# Patient Record
Sex: Female | Born: 1935 | Race: White | Hispanic: No | Smoking: Never smoker
Health system: Southern US, Community
[De-identification: ages and names within clinical notes are randomized; demographics above are authoritative.]

## PROBLEM LIST (undated history)

## (undated) DIAGNOSIS — E785 Hyperlipidemia, unspecified: Secondary | ICD-10-CM

## (undated) DIAGNOSIS — C801 Malignant (primary) neoplasm, unspecified: Secondary | ICD-10-CM

## (undated) DIAGNOSIS — H9190 Unspecified hearing loss, unspecified ear: Secondary | ICD-10-CM

## (undated) DIAGNOSIS — Z87442 Personal history of urinary calculi: Secondary | ICD-10-CM

## (undated) DIAGNOSIS — M1990 Unspecified osteoarthritis, unspecified site: Secondary | ICD-10-CM

## (undated) DIAGNOSIS — IMO0001 Reserved for inherently not codable concepts without codable children: Secondary | ICD-10-CM

## (undated) DIAGNOSIS — E039 Hypothyroidism, unspecified: Secondary | ICD-10-CM

## (undated) DIAGNOSIS — I2510 Atherosclerotic heart disease of native coronary artery without angina pectoris: Secondary | ICD-10-CM

## (undated) DIAGNOSIS — K219 Gastro-esophageal reflux disease without esophagitis: Secondary | ICD-10-CM

## (undated) DIAGNOSIS — I10 Essential (primary) hypertension: Secondary | ICD-10-CM

## (undated) HISTORY — PX: OTHER SURGICAL HISTORY: SHX169

## (undated) HISTORY — DX: Hyperlipidemia, unspecified: E78.5

## (undated) HISTORY — DX: Hypothyroidism, unspecified: E03.9

## (undated) HISTORY — PX: EYE SURGERY: SHX253

## (undated) HISTORY — PX: TUBAL LIGATION: SHX77

## (undated) HISTORY — DX: Unspecified osteoarthritis, unspecified site: M19.90

## (undated) HISTORY — DX: Essential (primary) hypertension: I10

## (undated) HISTORY — PX: ABDOMINAL HYSTERECTOMY: SHX81

## (undated) HISTORY — PX: BREAST BIOPSY: SHX20

## (undated) HISTORY — PX: CHOLECYSTECTOMY: SHX55

---

## 1997-04-19 HISTORY — PX: DILATION AND CURETTAGE OF UTERUS: SHX78

## 1997-04-19 HISTORY — PX: OTHER SURGICAL HISTORY: SHX169

## 1998-05-21 ENCOUNTER — Encounter: Payer: Self-pay | Admitting: *Deleted

## 1998-05-23 ENCOUNTER — Encounter: Payer: Self-pay | Admitting: Internal Medicine

## 1998-05-23 ENCOUNTER — Inpatient Hospital Stay (HOSPITAL_COMMUNITY): Admission: RE | Admit: 1998-05-23 | Discharge: 1998-05-30 | Payer: Self-pay | Admitting: *Deleted

## 1998-05-23 HISTORY — PX: KNEE ARTHROPLASTY: SHX992

## 1998-07-07 ENCOUNTER — Other Ambulatory Visit: Admission: RE | Admit: 1998-07-07 | Discharge: 1998-07-07 | Payer: Self-pay | Admitting: Obstetrics and Gynecology

## 1998-07-19 HISTORY — PX: JOINT REPLACEMENT: SHX530

## 1998-08-04 ENCOUNTER — Inpatient Hospital Stay (HOSPITAL_COMMUNITY): Admission: RE | Admit: 1998-08-04 | Discharge: 1998-08-08 | Payer: Self-pay | Admitting: *Deleted

## 1998-09-22 ENCOUNTER — Ambulatory Visit (HOSPITAL_COMMUNITY): Admission: RE | Admit: 1998-09-22 | Discharge: 1998-09-22 | Payer: Self-pay | Admitting: Obstetrics and Gynecology

## 1998-11-25 ENCOUNTER — Other Ambulatory Visit: Admission: RE | Admit: 1998-11-25 | Discharge: 1998-11-25 | Payer: Self-pay | Admitting: Obstetrics and Gynecology

## 1999-09-18 LAB — HM DEXA SCAN: HM Dexa Scan: NORMAL

## 1999-09-22 ENCOUNTER — Other Ambulatory Visit: Admission: RE | Admit: 1999-09-22 | Discharge: 1999-09-22 | Payer: Self-pay | Admitting: Family Medicine

## 2003-05-25 ENCOUNTER — Encounter: Payer: Self-pay | Admitting: Family Medicine

## 2003-05-25 LAB — CONVERTED CEMR LAB: Pap Smear: NORMAL

## 2003-06-14 ENCOUNTER — Other Ambulatory Visit: Admission: RE | Admit: 2003-06-14 | Discharge: 2003-06-14 | Payer: Self-pay | Admitting: Family Medicine

## 2003-07-05 LAB — FECAL OCCULT BLOOD, GUAIAC: Fecal Occult Blood: NEGATIVE

## 2004-04-30 ENCOUNTER — Ambulatory Visit: Payer: Self-pay | Admitting: Family Medicine

## 2004-05-14 ENCOUNTER — Ambulatory Visit: Payer: Self-pay

## 2004-05-28 ENCOUNTER — Ambulatory Visit: Payer: Self-pay | Admitting: Family Medicine

## 2004-07-30 ENCOUNTER — Encounter: Admission: RE | Admit: 2004-07-30 | Discharge: 2004-07-30 | Payer: Self-pay | Admitting: Family Medicine

## 2005-02-23 ENCOUNTER — Emergency Department (HOSPITAL_COMMUNITY): Admission: EM | Admit: 2005-02-23 | Discharge: 2005-02-23 | Payer: Self-pay | Admitting: Emergency Medicine

## 2005-07-12 ENCOUNTER — Ambulatory Visit: Payer: Self-pay | Admitting: Family Medicine

## 2005-07-12 LAB — CONVERTED CEMR LAB: TSH: 2.16 microintl units/mL

## 2005-10-12 ENCOUNTER — Ambulatory Visit: Payer: Self-pay | Admitting: Family Medicine

## 2005-12-15 ENCOUNTER — Ambulatory Visit: Payer: Self-pay | Admitting: Family Medicine

## 2006-07-05 ENCOUNTER — Ambulatory Visit: Payer: Self-pay | Admitting: Family Medicine

## 2006-10-05 ENCOUNTER — Ambulatory Visit: Payer: Self-pay | Admitting: Family Medicine

## 2006-10-05 DIAGNOSIS — E039 Hypothyroidism, unspecified: Secondary | ICD-10-CM | POA: Insufficient documentation

## 2006-10-05 DIAGNOSIS — E7800 Pure hypercholesterolemia, unspecified: Secondary | ICD-10-CM | POA: Insufficient documentation

## 2006-10-10 LAB — CONVERTED CEMR LAB
ALT: 22 units/L (ref 0–40)
AST: 21 units/L (ref 0–37)
Albumin: 3.4 g/dL — ABNORMAL LOW (ref 3.5–5.2)
BUN: 16 mg/dL (ref 6–23)
Basophils Absolute: 0.1 10*3/uL (ref 0.0–0.1)
Basophils Relative: 0.7 % (ref 0.0–1.0)
CO2: 27 meq/L (ref 19–32)
Calcium: 8.8 mg/dL (ref 8.4–10.5)
Chloride: 109 meq/L (ref 96–112)
Cholesterol: 213 mg/dL (ref 0–200)
Creatinine, Ser: 0.9 mg/dL (ref 0.4–1.2)
Direct LDL: 138.7 mg/dL
Eosinophils Absolute: 0.3 10*3/uL (ref 0.0–0.6)
Eosinophils Relative: 2.8 % (ref 0.0–5.0)
GFR calc Af Amer: 79 mL/min
GFR calc non Af Amer: 65 mL/min
Glucose, Bld: 107 mg/dL — ABNORMAL HIGH (ref 70–99)
HCT: 39.4 % (ref 36.0–46.0)
HDL: 41.8 mg/dL (ref >39.0)
Hemoglobin: 13.7 g/dL (ref 12.0–15.0)
Lymphocytes Relative: 32.5 % (ref 12.0–46.0)
MCHC: 34.7 g/dL (ref 30.0–36.0)
MCV: 92.1 fL (ref 78.0–100.0)
Monocytes Absolute: 1 10*3/uL — ABNORMAL HIGH (ref 0.2–0.7)
Monocytes Relative: 9.6 % (ref 3.0–11.0)
Neutro Abs: 5.4 10*3/uL (ref 1.4–7.7)
Neutrophils Relative %: 54.4 % (ref 43.0–77.0)
Phosphorus: 3.5 mg/dL (ref 2.3–4.6)
Platelets: 181 10*3/uL (ref 150–400)
Potassium: 3.8 meq/L (ref 3.5–5.1)
RBC: 4.27 M/uL (ref 3.87–5.11)
RDW: 13.5 % (ref 11.5–14.6)
Sodium: 141 meq/L (ref 135–145)
TSH: 3.38 microintl units/mL (ref 0.35–5.50)
Total CHOL/HDL Ratio: 5.1
Triglycerides: 182 mg/dL — ABNORMAL HIGH (ref 0–149)
VLDL: 36 mg/dL (ref 0–40)
WBC: 10.1 10*3/uL (ref 4.5–10.5)

## 2007-03-10 ENCOUNTER — Encounter: Payer: Self-pay | Admitting: Family Medicine

## 2007-07-11 ENCOUNTER — Encounter: Payer: Self-pay | Admitting: Family Medicine

## 2007-07-11 DIAGNOSIS — E042 Nontoxic multinodular goiter: Secondary | ICD-10-CM | POA: Insufficient documentation

## 2007-07-11 DIAGNOSIS — M1990 Unspecified osteoarthritis, unspecified site: Secondary | ICD-10-CM | POA: Insufficient documentation

## 2007-07-11 DIAGNOSIS — R609 Edema, unspecified: Secondary | ICD-10-CM | POA: Insufficient documentation

## 2007-07-11 DIAGNOSIS — I10 Essential (primary) hypertension: Secondary | ICD-10-CM | POA: Insufficient documentation

## 2007-07-11 DIAGNOSIS — N3946 Mixed incontinence: Secondary | ICD-10-CM | POA: Insufficient documentation

## 2007-07-11 DIAGNOSIS — Z8679 Personal history of other diseases of the circulatory system: Secondary | ICD-10-CM | POA: Insufficient documentation

## 2007-07-11 DIAGNOSIS — R945 Abnormal results of liver function studies: Secondary | ICD-10-CM | POA: Insufficient documentation

## 2007-07-12 ENCOUNTER — Ambulatory Visit: Payer: Self-pay | Admitting: Family Medicine

## 2007-07-12 DIAGNOSIS — E669 Obesity, unspecified: Secondary | ICD-10-CM | POA: Insufficient documentation

## 2007-09-12 ENCOUNTER — Ambulatory Visit: Payer: Self-pay | Admitting: Family Medicine

## 2007-09-17 LAB — CONVERTED CEMR LAB
ALT: 25 units/L (ref 0–35)
AST: 21 units/L (ref 0–37)
Albumin: 3.5 g/dL (ref 3.5–5.2)
BUN: 13 mg/dL (ref 6–23)
Basophils Absolute: 0.1 10*3/uL (ref 0.0–0.1)
Basophils Relative: 1.2 % — ABNORMAL HIGH (ref 0.0–1.0)
CO2: 28 meq/L (ref 19–32)
Calcium: 9.1 mg/dL (ref 8.4–10.5)
Chloride: 108 meq/L (ref 96–112)
Cholesterol: 187 mg/dL (ref 0–200)
Creatinine, Ser: 1.1 mg/dL (ref 0.4–1.2)
Eosinophils Absolute: 0.3 10*3/uL (ref 0.0–0.7)
Eosinophils Relative: 3 % (ref 0.0–5.0)
GFR calc Af Amer: 63 mL/min
GFR calc non Af Amer: 52 mL/min
Glucose, Bld: 103 mg/dL — ABNORMAL HIGH (ref 70–99)
HCT: 38.6 % (ref 36.0–46.0)
HDL: 37.8 mg/dL — ABNORMAL LOW (ref >39.0)
Hemoglobin: 13.4 g/dL (ref 12.0–15.0)
Hgb A1c MFr Bld: 6 % (ref 4.6–6.0)
LDL Cholesterol: 119 mg/dL — ABNORMAL HIGH (ref 0–99)
Lymphocytes Relative: 35.7 % (ref 12.0–46.0)
MCHC: 34.8 g/dL (ref 30.0–36.0)
MCV: 90.2 fL (ref 78.0–100.0)
Monocytes Absolute: 0.9 10*3/uL (ref 0.1–1.0)
Monocytes Relative: 10.5 % (ref 3.0–12.0)
Neutro Abs: 4.5 10*3/uL (ref 1.4–7.7)
Neutrophils Relative %: 49.6 % (ref 43.0–77.0)
Phosphorus: 4 mg/dL (ref 2.3–4.6)
Platelets: 196 10*3/uL (ref 150–400)
Potassium: 4 meq/L (ref 3.5–5.1)
RBC: 4.28 M/uL (ref 3.87–5.11)
RDW: 13.5 % (ref 11.5–14.6)
Sodium: 141 meq/L (ref 135–145)
Total CHOL/HDL Ratio: 4.9
Triglycerides: 153 mg/dL — ABNORMAL HIGH (ref 0–149)
VLDL: 31 mg/dL (ref 0–40)
WBC: 9 10*3/uL (ref 4.5–10.5)

## 2007-09-26 ENCOUNTER — Ambulatory Visit: Payer: Self-pay | Admitting: Family Medicine

## 2007-09-26 DIAGNOSIS — R739 Hyperglycemia, unspecified: Secondary | ICD-10-CM

## 2007-09-26 DIAGNOSIS — R7303 Prediabetes: Secondary | ICD-10-CM | POA: Insufficient documentation

## 2007-12-27 ENCOUNTER — Ambulatory Visit: Payer: Self-pay | Admitting: Family Medicine

## 2007-12-28 LAB — CONVERTED CEMR LAB
ALT: 18 units/L (ref 0–35)
AST: 19 units/L (ref 0–37)
Cholesterol: 196 mg/dL (ref 0–200)
HDL: 36.6 mg/dL — ABNORMAL LOW (ref >39.0)
Hgb A1c MFr Bld: 5.8 % (ref 4.6–6.0)
LDL Cholesterol: 134 mg/dL — ABNORMAL HIGH (ref 0–99)
Total CHOL/HDL Ratio: 5.4
Triglycerides: 127 mg/dL (ref 0–149)
VLDL: 25 mg/dL (ref 0–40)

## 2008-01-01 ENCOUNTER — Ambulatory Visit: Payer: Self-pay | Admitting: Family Medicine

## 2008-06-26 ENCOUNTER — Ambulatory Visit: Payer: Self-pay | Admitting: Family Medicine

## 2008-06-27 LAB — CONVERTED CEMR LAB
ALT: 22 units/L (ref 0–35)
AST: 16 units/L (ref 0–37)
Albumin: 3.4 g/dL — ABNORMAL LOW (ref 3.5–5.2)
BUN: 18 mg/dL (ref 6–23)
CO2: 27 meq/L (ref 19–32)
Calcium: 9 mg/dL (ref 8.4–10.5)
Chloride: 106 meq/L (ref 96–112)
Cholesterol: 188 mg/dL (ref 0–200)
Creatinine, Ser: 1.1 mg/dL (ref 0.4–1.2)
GFR calc Af Amer: 63 mL/min
GFR calc non Af Amer: 52 mL/min
Glucose, Bld: 102 mg/dL — ABNORMAL HIGH (ref 70–99)
HDL: 46.6 mg/dL (ref >39.0)
Hgb A1c MFr Bld: 5.9 % (ref 4.6–6.0)
LDL Cholesterol: 117 mg/dL — ABNORMAL HIGH (ref 0–99)
Phosphorus: 4.1 mg/dL (ref 2.3–4.6)
Potassium: 3.8 meq/L (ref 3.5–5.1)
Sodium: 140 meq/L (ref 135–145)
Total CHOL/HDL Ratio: 4
Triglycerides: 123 mg/dL (ref 0–149)
VLDL: 25 mg/dL (ref 0–40)

## 2008-07-03 ENCOUNTER — Ambulatory Visit: Payer: Self-pay | Admitting: Family Medicine

## 2008-07-29 ENCOUNTER — Telehealth (INDEPENDENT_AMBULATORY_CARE_PROVIDER_SITE_OTHER): Payer: Self-pay | Admitting: *Deleted

## 2008-11-17 LAB — HM DIABETES EYE EXAM

## 2008-12-18 HISTORY — PX: HAND SURGERY: SHX662

## 2008-12-23 ENCOUNTER — Emergency Department (HOSPITAL_COMMUNITY): Admission: EM | Admit: 2008-12-23 | Discharge: 2008-12-23 | Payer: Self-pay | Admitting: Emergency Medicine

## 2009-01-02 ENCOUNTER — Ambulatory Visit: Payer: Self-pay | Admitting: Family Medicine

## 2009-01-03 ENCOUNTER — Encounter: Admission: RE | Admit: 2009-01-03 | Discharge: 2009-01-03 | Payer: Self-pay | Admitting: Family Medicine

## 2009-01-06 ENCOUNTER — Ambulatory Visit: Payer: Self-pay | Admitting: Family Medicine

## 2009-04-23 ENCOUNTER — Ambulatory Visit: Payer: Self-pay | Admitting: Family Medicine

## 2009-04-24 ENCOUNTER — Encounter (INDEPENDENT_AMBULATORY_CARE_PROVIDER_SITE_OTHER): Payer: Self-pay | Admitting: *Deleted

## 2009-04-24 LAB — CONVERTED CEMR LAB
ALT: 16 units/L (ref 0–35)
AST: 19 units/L (ref 0–37)
Albumin: 3.5 g/dL (ref 3.5–5.2)
BUN: 14 mg/dL (ref 6–23)
CO2: 29 meq/L (ref 19–32)
Calcium: 9.7 mg/dL (ref 8.4–10.5)
Chloride: 104 meq/L (ref 96–112)
Cholesterol: 192 mg/dL (ref 0–200)
Creatinine, Ser: 1 mg/dL (ref 0.4–1.2)
Creatinine,U: 32.7 mg/dL
GFR calc non Af Amer: 57.49 mL/min (ref >60)
Glucose, Bld: 108 mg/dL — ABNORMAL HIGH (ref 70–99)
HDL: 46.7 mg/dL (ref >39.00)
Hgb A1c MFr Bld: 5.8 % (ref 4.6–6.5)
LDL Cholesterol: 113 mg/dL — ABNORMAL HIGH (ref 0–99)
Microalb Creat Ratio: 241.6 mg/g — ABNORMAL HIGH (ref 0.0–30.0)
Microalb, Ur: 7.9 mg/dL — ABNORMAL HIGH (ref 0.0–1.9)
Phosphorus: 4.4 mg/dL (ref 2.3–4.6)
Potassium: 4.5 meq/L (ref 3.5–5.1)
Sodium: 140 meq/L (ref 135–145)
TSH: 4.18 microintl units/mL (ref 0.35–5.50)
Total CHOL/HDL Ratio: 4
Triglycerides: 162 mg/dL — ABNORMAL HIGH (ref 0.0–149.0)
VLDL: 32.4 mg/dL (ref 0.0–40.0)

## 2009-05-27 ENCOUNTER — Ambulatory Visit: Payer: Self-pay | Admitting: Family Medicine

## 2009-07-22 ENCOUNTER — Ambulatory Visit: Payer: Self-pay | Admitting: Family Medicine

## 2009-07-23 ENCOUNTER — Encounter: Payer: Self-pay | Admitting: Family Medicine

## 2009-07-25 ENCOUNTER — Telehealth: Payer: Self-pay | Admitting: Family Medicine

## 2009-07-25 ENCOUNTER — Ambulatory Visit: Payer: Self-pay | Admitting: Family Medicine

## 2009-07-25 ENCOUNTER — Encounter: Admission: RE | Admit: 2009-07-25 | Discharge: 2009-07-25 | Payer: Self-pay | Admitting: Family Medicine

## 2009-10-17 ENCOUNTER — Ambulatory Visit: Payer: Self-pay | Admitting: Family Medicine

## 2009-10-18 LAB — CONVERTED CEMR LAB
ALT: 15 units/L (ref 0–35)
AST: 18 units/L (ref 0–37)
Albumin: 3.8 g/dL (ref 3.5–5.2)
BUN: 18 mg/dL (ref 6–23)
Basophils Absolute: 0 10*3/uL (ref 0.0–0.1)
Basophils Relative: 0.3 % (ref 0.0–3.0)
CO2: 28 meq/L (ref 19–32)
Calcium: 9 mg/dL (ref 8.4–10.5)
Chloride: 106 meq/L (ref 96–112)
Cholesterol: 207 mg/dL — ABNORMAL HIGH (ref 0–200)
Creatinine, Ser: 1 mg/dL (ref 0.4–1.2)
Creatinine,U: 105.3 mg/dL
Direct LDL: 141.8 mg/dL
Eosinophils Absolute: 0.3 10*3/uL (ref 0.0–0.7)
Eosinophils Relative: 2.8 % (ref 0.0–5.0)
GFR calc non Af Amer: 56.76 mL/min (ref >60)
Glucose, Bld: 111 mg/dL — ABNORMAL HIGH (ref 70–99)
HCT: 37.1 % (ref 36.0–46.0)
HDL: 50.4 mg/dL (ref >39.00)
Hemoglobin: 12.9 g/dL (ref 12.0–15.0)
Hgb A1c MFr Bld: 6.1 % (ref 4.6–6.5)
Lymphocytes Relative: 29.3 % (ref 12.0–46.0)
Lymphs Abs: 3.1 10*3/uL (ref 0.7–4.0)
MCHC: 34.8 g/dL (ref 30.0–36.0)
MCV: 91.9 fL (ref 78.0–100.0)
Microalb Creat Ratio: 37.7 mg/g — ABNORMAL HIGH (ref 0.0–30.0)
Microalb, Ur: 39.7 mg/dL — ABNORMAL HIGH (ref 0.0–1.9)
Monocytes Absolute: 0.9 10*3/uL (ref 0.1–1.0)
Monocytes Relative: 8.2 % (ref 3.0–12.0)
Neutro Abs: 6.4 10*3/uL (ref 1.4–7.7)
Neutrophils Relative %: 59.4 % (ref 43.0–77.0)
Phosphorus: 4.1 mg/dL (ref 2.3–4.6)
Platelets: 229 10*3/uL (ref 150.0–400.0)
Potassium: 4.2 meq/L (ref 3.5–5.1)
RBC: 4.03 M/uL (ref 3.87–5.11)
RDW: 15.2 % — ABNORMAL HIGH (ref 11.5–14.6)
Sodium: 141 meq/L (ref 135–145)
TSH: 3.78 microintl units/mL (ref 0.35–5.50)
Total CHOL/HDL Ratio: 4
Triglycerides: 134 mg/dL (ref 0.0–149.0)
VLDL: 26.8 mg/dL (ref 0.0–40.0)
WBC: 10.7 10*3/uL — ABNORMAL HIGH (ref 4.5–10.5)

## 2009-10-22 ENCOUNTER — Ambulatory Visit: Payer: Self-pay | Admitting: Family Medicine

## 2010-01-21 ENCOUNTER — Ambulatory Visit: Payer: Self-pay | Admitting: Family Medicine

## 2010-01-21 LAB — CONVERTED CEMR LAB
ALT: 17 units/L (ref 0–35)
AST: 19 units/L (ref 0–37)
Albumin: 3.6 g/dL (ref 3.5–5.2)
BUN: 18 mg/dL (ref 6–23)
CO2: 29 meq/L (ref 19–32)
Calcium: 9.1 mg/dL (ref 8.4–10.5)
Chloride: 104 meq/L (ref 96–112)
Cholesterol: 207 mg/dL — ABNORMAL HIGH (ref 0–200)
Creatinine, Ser: 1 mg/dL (ref 0.4–1.2)
Direct LDL: 143.9 mg/dL
GFR calc non Af Amer: 56.72 mL/min (ref >60)
Glucose, Bld: 106 mg/dL — ABNORMAL HIGH (ref 70–99)
HDL: 51.3 mg/dL (ref >39.00)
Hgb A1c MFr Bld: 6.2 % (ref 4.6–6.5)
Phosphorus: 3.9 mg/dL (ref 2.3–4.6)
Potassium: 4.2 meq/L (ref 3.5–5.1)
Sodium: 140 meq/L (ref 135–145)
Total CHOL/HDL Ratio: 4
Triglycerides: 121 mg/dL (ref 0.0–149.0)
VLDL: 24.2 mg/dL (ref 0.0–40.0)

## 2010-01-26 ENCOUNTER — Ambulatory Visit: Payer: Self-pay | Admitting: Family Medicine

## 2010-01-26 LAB — HM DIABETES FOOT EXAM

## 2010-02-01 ENCOUNTER — Encounter: Payer: Self-pay | Admitting: Family Medicine

## 2010-02-01 ENCOUNTER — Emergency Department (HOSPITAL_COMMUNITY): Admission: EM | Admit: 2010-02-01 | Discharge: 2010-02-01 | Payer: Self-pay | Admitting: Emergency Medicine

## 2010-02-11 ENCOUNTER — Ambulatory Visit: Payer: Self-pay | Admitting: Family Medicine

## 2010-02-11 DIAGNOSIS — R519 Headache, unspecified: Secondary | ICD-10-CM | POA: Insufficient documentation

## 2010-02-11 DIAGNOSIS — M542 Cervicalgia: Secondary | ICD-10-CM | POA: Insufficient documentation

## 2010-02-11 DIAGNOSIS — R51 Headache: Secondary | ICD-10-CM | POA: Insufficient documentation

## 2010-02-18 LAB — CONVERTED CEMR LAB: Sed Rate: 38 mm/hr — ABNORMAL HIGH (ref 0–22)

## 2010-02-25 ENCOUNTER — Ambulatory Visit: Payer: Self-pay | Admitting: Family Medicine

## 2010-02-26 ENCOUNTER — Encounter: Admission: RE | Admit: 2010-02-26 | Discharge: 2010-02-26 | Payer: Self-pay | Admitting: Family Medicine

## 2010-03-05 ENCOUNTER — Ambulatory Visit: Payer: Self-pay | Admitting: Family Medicine

## 2010-03-17 ENCOUNTER — Ambulatory Visit: Payer: Self-pay | Admitting: Family Medicine

## 2010-03-17 LAB — CONVERTED CEMR LAB
ALT: 18 units/L (ref 0–35)
AST: 19 units/L (ref 0–37)
CRP, High Sensitivity: 16.16 — ABNORMAL HIGH (ref 0.00–5.00)
Cholesterol: 162 mg/dL (ref 0–200)
HDL: 50 mg/dL (ref >39.00)
LDL Cholesterol: 87 mg/dL (ref 0–99)
Sed Rate: 36 mm/hr — ABNORMAL HIGH (ref 0–22)
Total CHOL/HDL Ratio: 3
Triglycerides: 127 mg/dL (ref 0.0–149.0)
VLDL: 25.4 mg/dL (ref 0.0–40.0)

## 2010-03-31 ENCOUNTER — Ambulatory Visit: Payer: Self-pay | Admitting: Family Medicine

## 2010-05-21 NOTE — Progress Notes (Signed)
Summary: see x-ray reports  Phone Note From Other Clinic   Caller: [facility] Summary of Call: Called reports on feet and ankle x-rays.  Reports on in pt's chart. Initial call taken by: Lowella Petties CMA,  July 25, 2009 10:50 AM  Follow-up for Phone Call        please let pt know that her x rays are ok  there is some arthritis - in ankle and feet (called degenerative changes) no sign of bone infection- which is re-assuring elevate ankle over weekend - use ice 10 min at a time  if R ankle is not improved monday- let me know  Follow-up by: Judith Part MD,  July 25, 2009 11:11 AM  Additional Follow-up for Phone Call Additional follow up Details #1::        Patient notified as instructed by telephone. Lewanda Rife LPN  July 25, 8293 1:11 PM

## 2010-05-21 NOTE — Assessment & Plan Note (Signed)
Summary: 2 M F/U  DLO   Vital Signs:  Patient Profile:   77 Years Old Female Weight:      253 pounds Temp:     97.9 degrees F oral Pulse rate:   60 / minute Pulse rhythm:   regular BP sitting:   152 / 80  (left arm) Cuff size:   2 monlarge  Vitals Entered By: Lowella Petties (September 26, 2007 9:10 AM)                 Serial Vital Signs/Assessments:  Time      Position  BP       Pulse  Resp  Temp     By                     140/70                         Judith Part MD   Chief Complaint:  2 month follow up.  History of Present Illness: cut out some things in her diet-- fried foods more salads lately cut out a lot of the junk food  does not eat much red meat  LDL 119 (downn from 130s) HDL 37.8  exercises in garden  does walk- but not for exercise -- is very active with house and yard work  can not walk very far without having to sit down -gets tired 5-10 minutes  this may be due to wt or poor conditioning no sob or pain- just tired   AIC 6.0- she knows fair amt about DM  she does have some sweets currently and soda   bp at home mid 130s over 70s  no headaches or leg swelling     Current Allergies: No known allergies   Past Medical History:    Blood transfusion    Hypertension    Hypothyroidism    Osteoarthritis    hyperlipidemia    DM 2 6/09      Past Surgical History:    Reviewed history from 07/11/2007 and no changes required:       Cholecystectomy       Tubal ligation       Total right knee arthroplasty (05/23/1998)       Total knee replacement (07/1998)       Polyp removal       Dexa- normal (09/1999)       Rib fracture       Carotid dopplers- normal (04/2004)       Wrist fracture (2007)       Kidney stones (2007)   Family History:    Reviewed history from 07/11/2007 and no changes required:       Father: died from MI       Mother: died age 54       Siblings: 6 brothers- 1 died from cancer, 1 died from leukemia, 1 died from stomach  cancer, 1 had CABG, 2 ok.  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  Eyes      Denies blurring and eye pain.  CV      Denies chest pain or discomfort, near fainting, palpitations, shortness of breath with exertion, and swelling of feet.  Resp      Denies cough and wheezing.  GI      Denies abdominal pain and change in bowel habits.  GU      Denies urinary frequency.  Derm      Denies changes in color of skin and rash.  Neuro      Complains of tingling.      Denies tremors and visual disturbances.      has baseline tingling in toes no new numbness in fingers or toes   Psych      Denies anxiety and depression.      mood is overall ok   Endo      Denies excessive thirst and excessive urination.   Physical Exam  General:     overweight but generally well appearing  Head:     normocephalic, atraumatic, and no abnormalities observed.   Eyes:     vision grossly intact, pupils equal, pupils round, and pupils reactive to light.   Neck:     supple with full rom and no masses or thyromegally, no JVD or carotid bruit  Lungs:     Normal respiratory effort, chest expands symmetrically. Lungs are clear to auscultation, no crackles or wheezes. Heart:     Normal rate and regular rhythm. S1 and S2 normal without gallop, murmur, click, rub or other extra sounds. Abdomen:     soft and non-tender.  no renal bruits  Msk:     No deformity or scoliosis noted of thoracic or lumbar spine.   Pulses:     R and L carotid,radial,femoral,dorsalis pedis and posterior tibial pulses are full and equal bilaterally Extremities:     No clubbing, cyanosis, edema, or deformity noted with normal full range of motion of all  joints.   Neurologic:     sensation intact to light touch, gait normal, and DTRs symmetrical and normal.  nl monofilament fingers and toes  Skin:     Intact without suspicious lesions or rashes lentigos diffusely  Cervical Nodes:     No lymphadenopathy noted Psych:     cheerful but sometimes gaurded affect overall pleasant     Impression & Recommendations:  Problem # 1:  OBESITY (ICD-278.00) Assessment: Unchanged pt is aware this is directly aff sugar control plan to re start wt watchers- info given goal 5-10 lb loss in 3 mo with rest cal diet and exercise 5-10 min daily ? unsure if pt is motivated at this time  Problem # 2:  HYPERTENSION (ICD-401.9) Assessment: Unchanged bp borderline today if still elevated at next visit after lifestyle change-- will need to add or inc tx Her updated medication list for this problem includes:    Metoprolol Tartrate 100 Mg Tabs (Metoprolol tartrate) .[name]... 1 by mouth two times a day    Norvasc 5 Mg Tabs (Amlodipine besylate) .[name]... Take one by mouth daily    Cardura 8 Mg Tabs (Doxazosin mesylate) .[name]... Take one by mouth daily   Problem # 3:  HYPERCHOLESTEROLEMIA, PURE (ICD-272.0) Assessment: Improved good job with dec LDL from 130s to 119 disc goal of 100 or below (or poss 70 if diabetic) will further cut sat fat in diet labs 3 mo then f/u Labs Reviewed: Chol: 187 (09/12/2007)   HDL: 37.8 (09/12/2007)  LDL: 119 (09/12/2007)   TG: 153 (09/12/2007) SGOT: 21 (09/12/2007)   SGPT: 25 (09/12/2007)   Problem # 4:  DIABETES MELLITUS, TYPE II (ICD-250.00) new dx pt chooses to work on diet and exercise for wt loss and re eval in 3 mo disc diet plan in detail -esp giving up sweets and soda if not imp with next [facility]-- will likely set up with Dm teaching and glucose test equipt pt wishes to stay off all meds unless abs necessary   Problem # 5:  GOITER, MULTINODULAR (ICD-241.1) past hx of goiter and hypothyroid will need new endo ref in  fall will disc further at f/u  Complete Medication List: 1)  Synthroid 75 Mcg Tabs (Levothyroxine sodium) .... Take one by mouth daily 2)  Metoprolol Tartrate 100 Mg Tabs (Metoprolol tartrate) .[name].. 1 by mouth two times a day 3)  Norvasc 5 Mg Tabs (Amlodipine besylate) .... Take one by mouth daily 4)  Cardura 8 Mg Tabs (Doxazosin mesylate) .... Take one by mouth daily   Patient Instructions: 1)  stay away from sweets and sugar drinks 2)  quit soda entirely  3)  diet soda and artificail sweeteners for tea and lemonade are fine 4)  think about weight watchers 5)  add 5-10 minutes of extra exercise daily 6)  goal is 5-10 lb weight loss in 3 months  7)  fasting labs 3 months lipid/ast/alt AIC then f/u 272, 250.0   ] Prior Medications (reviewed today): SYNTHROID 75 MCG  TABS (LEVOTHYROXINE SODIUM) take one by mouth daily METOPROLOL TARTRATE 100 MG  TABS (METOPROLOL TARTRATE) 1 by mouth two times a day NORVASC 5 MG  TABS (AMLODIPINE BESYLATE) take one by mouth daily CARDURA 8 MG  TABS (DOXAZOSIN MESYLATE) take one by mouth daily Current Allergies: No known allergies

## 2010-05-21 NOTE — Assessment & Plan Note (Signed)
Summary: ER F/U [facility] 02/01/10/DLO   Vital Signs:  Patient profile:   77 year old female Height:      63 inches Weight:      255.75 pounds BMI:     45.47 Temp:     97.3 degrees F oral Pulse rate:   64 / minute Pulse rhythm:   regular BP sitting:   118 / 70  (left arm) Cuff size:   large  Vitals Entered By: Lewanda Rife LPN (February 11, 2010 8:07 AM) CC: ER f/u from [facility] 02/01/10   History of Present Illness: here for f/u of ER visit on 10/17 for headache  she had so much pain in her neck and head that she just could not take it  2pm on 12th started with a neck crick - whole neck and really hurt to move it then a headache -- pain went into ears and into top of head  given prednisone and ultram  did Cs films   x rays of neck showed degenerative joint disease   now symptoms are improved but a dull pain on R side behind her ear  is constant and less severe and dull  sometimes hurts to move her neck  shoulders are sore too   given prednisone -- that did some good  given tramadol -- did not work at all   wt stable bp is ok     Allergies: 1)  ! Zocor 2)  ! Augmentin  Past History:  Past Medical History: Last updated: 05/27/2009 Blood transfusion Hypertension Hypothyroidism Osteoarthritis hyperlipidemia DM 2 6/09---------------pt ref flu or pneumo vaccines   hand surg- Dr Amanda Pea  Past Surgical History: Last updated: 01/02/2009 Cholecystectomy Tubal ligation Total right knee arthroplasty (05/23/1998) Total knee replacement (07/1998) Polyp removal Dexa- normal (09/1999) Rib fracture Carotid dopplers- normal (04/2004) Wrist fracture (2007) Kidney stones (2007) hand fx with surgery after a fall 9/10  Family History: Last updated: August 05, 2007 Father: died from MI Mother: died age 33 Siblings: 6 brothers- 1 died from cancer, 1 died from leukemia, 1 died from stomach cancer, 1 had CABG, 2 ok.  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 Eyes:  Denies blurring, double vision, eye irritation, eye pain, red eye, vision loss-1 eye, and vision loss-both eyes. ENT:  Denies nasal congestion, postnasal drainage, ringing in ears, sinus pressure, and sore throat. CV:  Denies chest pain or discomfort, palpitations, shortness of breath with exertion, and swelling of feet. Resp:  Denies cough and shortness of breath. GI:  Denies abdominal pain, bloody stools, change in bowel habits, and constipation. MS:  Complains of stiffness; denies joint redness, joint swelling, cramps, and muscle weakness. Derm:  Denies itching, lesion(s), poor wound healing, and rash. Neuro:  Denies brief paralysis, difficulty with concentration, disturbances in coordination, numbness, tingling, tremors, visual disturbances, and weakness. Psych:  Denies anxiety and depression. Endo:  Denies cold intolerance, excessive thirst, excessive urination, and polyuria. Heme:  Denies abnormal bruising, bleeding, and enlarge lymph nodes.  Physical Exam  General:  overweight but generally well appearing  Head:  normocephalic, atraumatic, and no abnormalities observed.  no sinus or temporal tenderness Eyes:  vision grossly intact, pupils equal, pupils round, and pupils reactive to  light.  no conjunctival pallor, injection or icterus  Ears:  R ear normal and L ear normal.   Nose:  no nasal discharge.   Mouth:  pharynx pink and moist.   Neck:  mild tenderness lower CS tender R>L peri cervical musculature  pain on full flex and ext (nl rotation) no JVD or bruits or thyromegally Chest Wall:  No deformities, masses, or tenderness noted. Lungs:  Normal respiratory  effort, chest expands symmetrically. Lungs are clear to auscultation, no crackles or wheezes. Heart:  Normal rate and regular rhythm. S1 and S2 normal without gallop, murmur, click, rub or other extra sounds. Msk:  see neck exam  no trapizius or myofascial trigger points Extremities:  No clubbing, cyanosis, edema, or deformity noted with normal full range of motion of all joints.   Neurologic:  alert & oriented X3, cranial nerves II-XII intact, strength normal in all extremities, sensation intact to light touch, gait normal, and DTRs symmetrical and normal.   Skin:  Intact without suspicious lesions or rashes Cervical Nodes:  No lymphadenopathy noted Psych:  pt is irritable today-- does not feel good , yet not in distress    Impression & Recommendations:  Problem # 1:  NECK PAIN (ICD-723.1) Assessment New with headache that is now resolved c/o occipital pain that radiates down neck and worse with flexion dx in ER with deg joint dz, no neurol symptoms  suspect there is muscle spasm going on  check ESR to r/o PMR or TA will replace tramadol with flexeril and update  use heat/ consider cervical support pillow  send for and rev ER records and films then if not imp consider PT or ortho consult update if return of ha or worse The following medications were removed from the medication list:    Tramadol Hcl 50 Mg Tabs (Tramadol hcl) .[name]... 1-2 tablets by mouth every 6 hours as needed for pain. (maximum of 8 tabs in 24 hrs) Her updated medication list for this problem includes:    Flexeril 10 Mg Tabs (Cyclobenzaprine hcl) .[name]... 1 by mouth three times a day as needed neck pain  Orders: Venipuncture ([address]) TLB-Sedimentation Rate (ESR) (85652-ESR) Prescription Created Electronically [phone])  Problem # 2:  HEADACHE (ICD-784.0) Assessment: New see above no neurol changes suspect from neck spasm The following medications were removed from the medication list:    Tramadol Hcl 50 Mg Tabs  (Tramadol hcl) .[name]... 1-2 tablets by mouth every 6 hours as needed for pain. (maximum of 8 tabs in 24 hrs) Her updated medication list for this problem includes:    Metoprolol Tartrate 100 Mg Tabs (Metoprolol tartrate) .[name]... 1 by mouth two times a day  Orders: Venipuncture ([address]) TLB-Sedimentation Rate (ESR) (85652-ESR) Prescription Created Electronically [phone])  Complete Medication List: 1)  Metoprolol Tartrate 100 Mg Tabs (Metoprolol tartrate) .[name].. 1 by mouth two times a day 2)  Norvasc 5 Mg Tabs (Amlodipine besylate) .... Take one by mouth daily 3)  Cardura 8 Mg Tabs (Doxazosin mesylate) .... Take one by mouth daily 4)  Support Stockings To The Knee 15-20 Mm Hg  .... To wear on legs once daily as directed for edema 5)  Lisinopril 5 Mg Tabs (Lisinopril) .... Take one by mouth daily 6)  Vitamin B-12 500 Mcg Tabs (Cyanocobalamin) .... Otc as directed. 7)  Lipitor 10 Mg Tabs (Atorvastatin calcium) .[name].. 1 by mouth once daily in evening 8)  Flexeril 10 Mg Tabs (Cyclobenzaprine hcl) .[name].. 1 by mouth three times a day as needed  neck pain  Patient Instructions: 1)  use heating pad on low on back of neck and painful areas  2)  use flexeril for muscle relaxer (it will sedate)- as needed with caution  3)  do not take any more tramadol  4)  please send for ER notes and x ray from [facility] on 10/17 please  5)  labs today for sed rate test  Prescriptions: FLEXERIL 10 MG TABS (CYCLOBENZAPRINE HCL) 1 by mouth three times a day as needed neck pain  #30 x 0   Entered and Authorized by:   Judith Part MD   Signed by:   Judith Part MD on 02/11/2010   Method used:   Electronically to        [facility]. [address] (retail)       [address] [place], [place]  [address]       Ph: [phone]       Fax: [phone]   RxID:   [id number]    Orders Added: 1)  Venipuncture [84132] 2)  TLB-Sedimentation Rate (ESR) [85652-ESR] 3)  Prescription Created  Electronically [G8553] 4)  Est. Patient Level IV [44010]    Current Allergies (reviewed today): ! ZOCOR ! AUGMENTIN

## 2010-05-21 NOTE — Assessment & Plan Note (Signed)
Summary: MONDAY FOLLOWUP/[facility]   Vital Signs:  Patient profile:   77 year old female Height:      63 inches Weight:      256 pounds BMI:     45.51 Temp:     97.5 degrees F oral Pulse rate:   76 / minute Pulse rhythm:   regular BP sitting:   130 / 80  (left arm) Cuff size:   large  Vitals Entered By: Liane Comber CMA (AAMA) (January 06, 2009 10:13 AM)  History of Present Illness: here for f/u of leg cellulitis (after a fall)  was changed from augmentin to septra Ds at last appt  also x ray- of tib-fib showed no osteomyelitis   leg is overall - not as painful as was  is still red -- but less so is much less swollen  overall getting around and not bothering her  no fever   this abx is much better tolerated - no stomach upset at all    Allergies: 1)  ! Zocor 2)  ! Augmentin  Past History:  Past Medical History: Last updated: 01/02/2009 Blood transfusion Hypertension Hypothyroidism Osteoarthritis hyperlipidemia DM 2 6/09  hand surg- Dr Amanda Pea  Past Surgical History: Last updated: 01/02/2009 Cholecystectomy Tubal ligation Total right knee arthroplasty (05/23/1998) Total knee replacement (07/1998) Polyp removal Dexa- normal (09/1999) Rib fracture Carotid dopplers- normal (04/2004) Wrist fracture (2007) Kidney stones (2007) hand fx with surgery after a fall 9/10  Family History: Last updated: 07-25-2007 Father: died from MI Mother: died age 74 Siblings: 6 brothers- 1 died from cancer, 1 died from leukemia, 1 died from stomach cancer, 1 had CABG, 2 ok.  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 Eyes:  Denies blurring and eye pain. CV:  Denies chest pain or discomfort and  palpitations. Resp:  Denies shortness of breath. GI:  Denies abdominal pain, bloody stools, change in bowel habits, and nausea. MS:  Complains of stiffness; denies cramps and muscle weakness. Derm:  Denies itching, poor wound healing, and rash. Neuro:  Denies headaches, numbness, and tingling.  Physical Exam  General:  overweight but generally well appearing  Head:  normocephalic, atraumatic, and no abnormalities observed.   Mouth:  pharynx pink and moist.   Neck:  supple with full rom and no masses or thyromegally, no JVD or carotid bruit  Lungs:  Normal respiratory effort, chest expands symmetrically. Lungs are clear to auscultation, no crackles or wheezes. Heart:  Normal rate and regular rhythm. S1 and S2 normal without gallop, murmur, click, rub or other extra sounds. Msk:  ecchymosis on knee improved with less swelling  scab is healing well  erythema on lat calf much improved - with little to no swelling  skin is less shiny- some wrinkling warm to touch- and less tender  nl rom ankle and knee   hand in splint- pt showed me pins intact - with healing and no redness  Neurologic:  sensation intact to light touch, gait normal, and DTRs symmetrical and normal.   Skin:  Intact without suspicious lesions or rashes Cervical Nodes:  No lymphadenopathy noted Psych:  normal affect, talkative and pleasant    Impression & Recommendations:  Problem # 1:  CELLULITIS, LEG, RIGHT (ICD-682.6) Assessment Improved much improvement after changing  abx to septra rev nl leg x ray with pt  adv to finish 10 day course - then update me if symptoms are not resolved also update if any worsening - see inst  will work to continue leg elevation when able  Her updated medication list for this problem includes:    Septra Ds 800-160 Mg Tabs (Sulfamethoxazole-trimethoprim) .[name]... 1 by mouth two times a day for 10 days  Problem # 2:  DIABETES MELLITUS, TYPE II (ICD-250.00) Assessment: Unchanged  f/u 3  mo - per pt in good control  last AIC under DM range  Labs Reviewed: Creat: 1.1 (06/26/2008)    Reviewed HgBA1c results: 5.9 (06/26/2008)  5.8 (12/27/2007)  Problem # 3:  HYPERTENSION (ICD-401.9) Assessment: Unchanged bp remains in acceptible control with current meds- no change f/u 3 mo  Her updated medication list for this problem includes:    Metoprolol Tartrate 100 Mg Tabs (Metoprolol tartrate) .[name]... 1 by mouth two times a day    Norvasc 5 Mg Tabs (Amlodipine besylate) .[name]... Take one by mouth daily    Cardura 8 Mg Tabs (Doxazosin mesylate) .[name]... Take one by mouth daily  BP today: 130/80 Prior BP: 120/68 (01/02/2009)  Labs Reviewed: K+: 3.8 (06/26/2008) Creat: : 1.1 (06/26/2008)   Chol: 188 (06/26/2008)   HDL: 46.6 (06/26/2008)   LDL: 117 (06/26/2008)   TG: 123 (06/26/2008)  Complete Medication List: 1)  Metoprolol Tartrate 100 Mg Tabs (Metoprolol tartrate) .[name].. 1 by mouth two times a day 2)  Norvasc 5 Mg Tabs (Amlodipine besylate) .... Take one by mouth daily 3)  Cardura 8 Mg Tabs (Doxazosin mesylate) .... Take one by mouth daily 4)  Nystatin Powd (Nystatin) .... Apply to affected area once daily 5)  Zocor 20 Mg Tabs (Simvastatin) .[name].. 1 by mouth once daily in evening (4/10 holding for muscle pain) 6)  Septra Ds 800-160 Mg Tabs (Sulfamethoxazole-trimethoprim) .[name].. 1 by mouth two times a day for 10 days  Patient Instructions: 1)  finish all antibiotic - septra 2)  elevate leg whenever you can  3)  alert me if any increase in redness/pain or any fever  4)  if still red when finish abx - also call and let me know  5)  cancel this weeks appt - and re schedule for 3 months  Prior Medications (reviewed today): METOPROLOL TARTRATE 100 MG  TABS (METOPROLOL TARTRATE) 1 by mouth two times a day NORVASC 5 MG  TABS (AMLODIPINE BESYLATE) take one by mouth daily CARDURA 8 MG  TABS (DOXAZOSIN MESYLATE) take one by mouth daily NYSTATIN  POWD (NYSTATIN) apply to affected area once  daily ZOCOR 20 MG TABS (SIMVASTATIN) 1 by mouth once daily in evening (4/10 holding for muscle pain) SEPTRA DS 800-160 MG TABS (SULFAMETHOXAZOLE-TRIMETHOPRIM) 1 by mouth two times a day for 10 days Current Allergies (reviewed today): ! ZOCOR ! AUGMENTIN Current Medications (including changes made in today's visit):  METOPROLOL TARTRATE 100 MG  TABS (METOPROLOL TARTRATE) 1 by mouth two times a day NORVASC 5 MG  TABS (AMLODIPINE BESYLATE) take one by mouth daily CARDURA 8 MG  TABS (DOXAZOSIN MESYLATE) take one by mouth daily NYSTATIN  POWD (NYSTATIN) apply to affected area once daily ZOCOR 20 MG TABS (SIMVASTATIN) 1 by mouth once daily in evening (4/10 holding for muscle pain) SEPTRA DS 800-160 MG TABS (SULFAMETHOXAZOLE-TRIMETHOPRIM) 1 by mouth two times a day for 10 days    Prevention & Chronic Care Immunizations   Influenza vaccine: Not documented    Tetanus  booster: 05/14/1997: Td    Pneumococcal vaccine: Not documented    H. zoster vaccine: Not documented  Colorectal Screening   Hemoccult: Negative  (07/05/2003)    Colonoscopy: Not documented  Other Screening   Pap smear: Normal  (05/25/2003)    Mammogram: Refused  (06/14/2003)    DXA bone density scan: Not documented   Smoking status: never  (07/11/2007)  Diabetes Mellitus   HgbA1C: 5.9  (06/26/2008)    Eye exam: Not documented    Foot exam: yes  (07/03/2008)   High risk foot: Not documented   Foot care education: Not documented    Urine microalbumin/creatinine ratio: Not documented  Lipids   Total Cholesterol: 188  (06/26/2008)   LDL: 117  (06/26/2008)   LDL Direct: 138.7  (10/05/2006)   HDL: 46.6  (06/26/2008)   Triglycerides: 123  (06/26/2008)    SGOT (AST): 16  (06/26/2008)   SGPT (ALT): 22  (06/26/2008)   Alkaline phosphatase: Not documented   Total bilirubin: Not documented  Hypertension   Last Blood Pressure: 130 / 80  (01/06/2009)   Serum creatinine: 1.1  (06/26/2008)   Serum potassium 3.8   (06/26/2008)  Self-Management Support :    Diabetes self-management support: Not documented    Hypertension self-management support: Not documented    Lipid self-management support: Not documented

## 2010-05-21 NOTE — Assessment & Plan Note (Signed)
Summary: F/UTEMPORAL ARTERITIS PER DR TOWER/RI   Vital Signs:  Patient profile:   77 year old female Height:      63 inches Weight:      260 pounds BMI:     46.22 Temp:     97.6 degrees F oral Pulse rate:   72 / minute Pulse rhythm:   regular BP sitting:   126 / 82  (left arm) Cuff size:   large  Vitals Entered By: Lewanda Rife LPN (March 31, 2010 8:57 AM) CC: follow-up visit for temporal arteritis   History of Present Illness: here for f/u of headache pt has had sed rate of 36- slt elevated and elevated crp  last phone note indicated ha better- and declined prednisone and neurol ref   wt is up 2 lb  Last Lipid ProfileCholesterol: 162 (03/17/2010 9:08:43 AM)HDL:  50.00 (03/17/2010 9:08:43 AM)LDL:  87 (03/17/2010 9:08:43 AM)Triglycerides:  Last Liver profileSGOT:  19 (03/17/2010 9:08:43 AM)SPGT:  18 (03/17/2010 9:08:43 AM)T. Bili:  Alk Phos:    in good control- on lipitor   bp good 126/82  not any better in terms of neck pain  almost no headache now -- when she does is on R side in the back  was told in the hospital she had arthritis and deg disc dz  that was causing her pain  takes occ bayer asa - that does help  she is not ready to follow up with ortho yet-- dealing with husband's problem    ibuprofen and motrin/ aleve- no help at all   prednisone did absolutely nothing for her -- she adamant about that  also flexeril did not help  had pain med from the hospital - did not work   it bothers her most when she sleeps at night -- her neck   neck hurts all the way down and into shoulder blade area   no vision trouble at all     Allergies: 1)  ! Zocor 2)  ! Augmentin  Past History:  Past Medical History: Last updated: 05/27/2009 Blood transfusion Hypertension Hypothyroidism Osteoarthritis hyperlipidemia DM 2 6/09---------------pt ref flu or pneumo vaccines   hand surg- Dr Amanda Pea  Past Surgical History: Last updated: 01/02/2009 Cholecystectomy Tubal  ligation Total right knee arthroplasty (05/23/1998) Total knee replacement (07/1998) Polyp removal Dexa- normal (09/1999) Rib fracture Carotid dopplers- normal (04/2004) Wrist fracture (2007) Kidney stones (2007) hand fx with surgery after a fall 9/10  Family History: Last updated: 08/02/07 Father: died from MI Mother: died age 71 Siblings: 6 brothers- 1 died from cancer, 1 died from leukemia, 1 died from stomach cancer, 1 had CABG, 2 ok.  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 Eyes:  Denies blurring, discharge, double vision, and eye irritation; no vision change at all. ENT:  Denies sinus pressure and sore throat. CV:  Denies chest pain or discomfort, palpitations, and swelling of feet. Resp:  Denies cough, shortness of breath, and wheezing. GI:  Denies nausea. MS:  Complains of mid back pain, muscle aches, and stiffness; denies joint redness and joint swelling. Derm:  Denies itching, lesion(s), poor wound healing, and rash. Neuro:  Complains of difficulty with concentration, disturbances in coordination, headaches, numbness, poor balance, and tingling; denies visual disturbances and weakness. Psych:  Denies  anxiety and depression. Endo:  Denies cold intolerance, excessive thirst, excessive urination, and heat intolerance. Heme:  Denies abnormal bruising and bleeding.  Physical Exam  General:  overweight but generally well appearing  Head:  normocephalic, atraumatic, and no abnormalities observed.  no temporal tenderness  Eyes:  vision grossly intact, pupils equal, pupils round, and pupils reactive to light.  fundi grossly wnl no conjunctival pallor, injection or icterus  Ears:  R ear normal and L ear normal.     Mouth:  pharynx pink and moist.   Neck:  some bony tenderness over lower neck and upper TS pain to fully extend and rotate  some perimuscular tenderness Chest Wall:  No deformities, masses, or tenderness noted. Lungs:  Normal respiratory effort, chest expands symmetrically. Lungs are clear to auscultation, no crackles or wheezes. Heart:  Normal rate and regular rhythm. S1 and S2 normal without gallop, murmur, click, rub or other extra sounds. Abdomen:  soft, non-tender, and normal bowel sounds.  no renal bruits  Msk:  tender over lower cs and upper TS today see neck exam  nl rom arms  Pulses:  R and L carotid,radial,femoral,dorsalis pedis and posterior tibial pulses are full and equal bilaterally Extremities:  No clubbing, cyanosis, edema, or deformity noted with normal full range of motion of all joints.   Neurologic:  strength normal in all extremities, sensation intact to light touch, gait normal, and DTRs symmetrical and normal.   Skin:  Intact without suspicious lesions or rashes Cervical Nodes:  No lymphadenopathy noted Psych:  normal affect, talkative and pleasant    Impression & Recommendations:  Problem # 1:  HEADACHE (ICD-784.0) Assessment Improved this is pretty much resolved rev labs  will update if it returns  Her updated medication list for this problem includes:    Metoprolol Tartrate 100 Mg Tabs (Metoprolol tartrate) .[name]... 1 by mouth two times a day    Bayer Aspirin 325 Mg Tabs (Aspirin) .[name]... 1-2 by mouth with food up to three times a day for pain  Problem # 2:  NECK PAIN (ICD-723.1) Assessment: Unchanged ongoing - now more in TS area  no imp with prednisone / flexeril /nsaids or narcotics pt wants to continue asa as needed with caution and call when she is ready for ortho consult doubt pmr due to bony tenderness and also no imp with pred The following medications were removed from the medication list:    Flexeril 10 Mg Tabs (Cyclobenzaprine hcl) .[name]... 1 by  mouth three times a day as needed neck pain Her updated medication list for this problem includes:    Bayer Aspirin 325 Mg Tabs (Aspirin) .[name]... 1-2 by mouth with food up to three times a day for pain  Problem # 3:  HYPERCHOLESTEROLEMIA, PURE (ICD-272.0) Assessment: Unchanged  good control with lipitor disc low sat fat diet f/u 6 mo  Her updated medication list for this problem includes:    Lipitor 10 Mg Tabs (Atorvastatin calcium) .[name]... 1 by mouth once daily in evening  Labs Reviewed: SGOT: 19 (03/17/2010)   SGPT: 18 (03/17/2010)   HDL:50.00 (03/17/2010), 51.30 (01/21/2010)  LDL:87 (03/17/2010), 113 (04/54/0981)  Chol:162 (03/17/2010), 207 (01/21/2010)  Trig:127.0 (03/17/2010), 121.0 (01/21/2010)  Orders: Prescription Created Electronically [id number])  Complete Medication List: 1)  Metoprolol Tartrate 100 Mg Tabs (Metoprolol tartrate) .[name].. 1 by mouth two times a day 2)  Norvasc 5 Mg Tabs (Amlodipine besylate) .... Take one by mouth daily 3)  Cardura 8 Mg Tabs (Doxazosin mesylate) .... Take one by  mouth daily 4)  Support Stockings To The Knee 15-20 Mm Hg  .... To wear on legs once daily as directed for edema 5)  Lisinopril 5 Mg Tabs (Lisinopril) .... Take one by mouth daily 6)  Vitamin B-12 500 Mcg Tabs (Cyanocobalamin) .... Otc as directed. 7)  Lipitor 10 Mg Tabs (Atorvastatin calcium) .[name].. 1 by mouth once daily in evening 8)  Bayer Aspirin 325 Mg Tabs (Aspirin) .[name].. 1-2 by mouth with food up to three times a day for pain  Patient Instructions: 1)  continue aspirin if it helps with food -- use caution (can cause bleeding and stomach upset)-- since no other medicines have worked 2)  you can also try stretching and heat  3)  when things settle down -- call and I will refer you to orthopedics  4)  follow up with me in about 6 months   Prescriptions: LIPITOR 10 MG TABS (ATORVASTATIN CALCIUM) 1 by mouth once daily in evening  #90 x 3   Entered and Authorized by:   Judith Part MD    Signed by:   Judith Part MD on 03/31/2010   Method used:   Electronically to        [facility]. [address] (retail)       [address] [place], [place]  [address]       Ph: [phone]       Fax: [phone]   RxID:   [id number] LISINOPRIL 5 MG TABS (LISINOPRIL) take one by mouth daily  #90 x 3   Entered and Authorized by:   Judith Part MD   Signed by:   Judith Part MD on 03/31/2010   Method used:   Electronically to        [facility]. [address] (retail)       [address] [place], [place]  [address]       Ph: [phone]       Fax: [phone]   RxID:   [id number] CARDURA 8 MG  TABS (DOXAZOSIN MESYLATE) take one by mouth daily  #90 x 3   Entered and Authorized by:   Judith Part MD   Signed by:   Judith Part MD on 03/31/2010   Method used:   Electronically to        [facility]. [address] (retail)       [address] [place], [place]  [address]       Ph: [phone]       Fax: [phone]   RxID:   [id number] NORVASC 5 MG  TABS (AMLODIPINE BESYLATE) take one by mouth daily  #90 x 3   Entered and Authorized by:   Judith Part MD   Signed by:   Judith Part MD on 03/31/2010   Method used:   Electronically to        [facility]. [address] (retail)       [address] [place], [place]  [address]       Ph: [phone]       Fax: [phone]   RxID:  [id number] METOPROLOL TARTRATE 100 MG  TABS (METOPROLOL TARTRATE) 1 by mouth two times a day  #180 x 3   Entered and Authorized by:   Judith Part MD   Signed by:   Judith Part MD on 03/31/2010   Method used:   Electronically to        [facility]. [address] (retail)       [address] [place], [place]  [address]       Ph: [phone]       Fax: [phone]   RxID:    [id number]    Orders Added: 1)  Prescription Created Electronically [G8553] 2)  Est. Patient Level IV [18841]    Current Allergies (reviewed today): ! ZOCOR ! AUGMENTIN

## 2010-05-21 NOTE — Miscellaneous (Signed)
Summary: med list update  Clinical Lists Changes  Medications: Added new medication of LISINOPRIL 5 MG TABS (LISINOPRIL) take one by mouth daily     Prior Medications: METOPROLOL TARTRATE 100 MG  TABS (METOPROLOL TARTRATE) 1 by mouth two times a day NORVASC 5 MG  TABS (AMLODIPINE BESYLATE) take one by mouth daily CARDURA 8 MG  TABS (DOXAZOSIN MESYLATE) take one by mouth daily Current Allergies: ! ZOCOR ! AUGMENTIN

## 2010-05-21 NOTE — Assessment & Plan Note (Signed)
Summary: 3 MONTH FOLLOW UP/[facility]   Vital Signs:  Patient profile:   77 year old female Weight:      257 pounds BMI:     45.69 Temp:     97.8 degrees F oral Pulse rate:   72 / minute Pulse rhythm:   regular BP sitting:   122 / 84  (left arm) Cuff size:   large  Vitals Entered By: Lowella Petties CMA (April 23, 2009 9:04 AM) CC: 3 month follow up   History of Present Illness: here for f/u of HTN , DM, thyroid, lipids   r ankle is much better - but is still swollen - and this bothers her  really wants help with this -- just the R side  infection is completely resolved   wt is up today 1 lb with BMI of 45  thyroid due for a check   120/72  bp feeling good   DM- last AIC under 6-- due for check  opthy- was august -- Dr Alvester Morin diet is not good because of the holidays  she is not ready to make changes she needs to -- until spring  does not make priority of her healthy   lipids due - has had fair control Last Lipid ProfileCholesterol: 188 (06/26/2008 10:31:00 AM)HDL:  46.6 (06/26/2008 10:31:00 AM)LDL:  117 (06/26/2008 10:31:00 AM)Triglycerides:  Last Liver profileSGOT:  16 (06/26/2008 10:31:00 AM)SPGT:  22 (06/26/2008 10:31:00 AM)T. Bili:  Alk Phos:     refuses flu shot and pneumonia -- will never take them      Allergies: 1)  ! Zocor 2)  ! Augmentin  Past History:  Past Medical History: Last updated: 01/02/2009 Blood transfusion Hypertension Hypothyroidism Osteoarthritis hyperlipidemia DM 2 6/09  hand surg- Dr Amanda Pea  Past Surgical History: Last updated: 01/02/2009 Cholecystectomy Tubal ligation Total right knee arthroplasty (05/23/1998) Total knee replacement (07/1998) Polyp removal Dexa- normal (09/1999) Rib fracture Carotid dopplers- normal (04/2004) Wrist fracture (2007) Kidney stones (2007) hand fx with surgery after a fall 9/10  Family History: Last updated: 2007-08-05 Father: died from MI Mother: died age 4 Siblings: 6 brothers- 1 died  from cancer, 1 died from leukemia, 1 died from stomach cancer, 1 had CABG, 2 ok.  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 Eyes:  Denies blurring and eye irritation. CV:  Complains of swelling of feet; denies chest pain or discomfort, palpitations, and shortness of breath with exertion. Resp:  Denies cough, shortness of breath, and wheezing. GI:  Denies abdominal pain, bloody stools, change in bowel habits, and indigestion. GU:  Denies abnormal vaginal bleeding, discharge, and dysuria. MS:  Denies joint pain. Derm:  Denies itching, lesion(s), poor wound healing, and rash. Neuro:  Denies numbness and tingling. Psych:  Denies anxiety and depression. Endo:  Denies cold intolerance, excessive thirst, excessive urination, and heat intolerance. Heme:  Denies abnormal bruising and bleeding.  Physical Exam  General:  overweight but generally well appearing  Head:  normocephalic, atraumatic, and no abnormalities observed.   Eyes:  vision grossly intact, pupils equal, pupils round, and pupils reactive to light.   Mouth:  pharynx pink and moist.   Neck:  supple with full rom and no masses or thyromegally, no JVD or carotid bruit  Lungs:  Normal respiratory  effort, chest expands symmetrically. Lungs are clear to auscultation, no crackles or wheezes. Heart:  Normal rate and regular rhythm. S1 and S2 normal without gallop, murmur, click, rub or other extra sounds. Msk:  ecchymosis on knee improved with less swelling  scab is healing well  erythema on lat calf much improved - with little to no swelling  skin is less shiny- some wrinkling warm to touch- and less tender  nl rom ankle and knee   hand in splint- pt showed me pins intact - with  healing and no redness  Extremities:  plus one edema in  R ankle, trace in L  both nl rom no tenderness  Neurologic:  sensation intact to light touch, gait normal, and DTRs symmetrical and normal.   Skin:  Intact without suspicious lesions or rashes lentigos diffusely  Cervical Nodes:  No lymphadenopathy noted Psych:  normal affect, talkative and pleasant   Diabetes Management Exam:    Eye Exam:       Eye Exam done elsewhere          Date: 11/17/2008          Results: diabetic retinopathy          Done by: Dr Alvester Morin   Impression & Recommendations:  Problem # 1:  DIABETES MELLITUS, TYPE II (ICD-250.00) Assessment Unchanged  mild- continue to follow pt is not following diet - not interested in this or wt loss labl today opthy utd  no ace due to intolerance  Orders: TLB-Lipid Panel (80061-LIPID) TLB-Renal Function Panel (80069-RENAL) TLB-TSH (Thyroid Stimulating Hormone) (84443-TSH) TLB-ALT (SGPT) (84460-ALT) TLB-AST (SGOT) (84450-SGOT) TLB-A1C / Hgb A1C (Glycohemoglobin) (83036-A1C) TLB-Microalbumin/Creat Ratio, Urine (82043-MALB)  Labs Reviewed: Creat: 1.1 (06/26/2008)     Last Eye Exam: diabetic retinopathy (11/17/2008) Reviewed HgBA1c results: 5.9 (06/26/2008)  5.8 (12/27/2007)  Problem # 2:  LEG EDEMA (ICD-782.3) Assessment: Deteriorated with worse symptoms R leg due to past injuries  rec supp hose to knee- 15-20 mm hg  elevate legs wt loss and cutting sodium enc  update if not imp  Problem # 3:  HYPERTENSION (ICD-401.9) Assessment: Unchanged  bp  well controlled intol to ace  no change in med lab today f/u 6 mo  Her updated medication list for this problem includes:    Metoprolol Tartrate 100 Mg Tabs (Metoprolol tartrate) .[name]... 1 by mouth two times a day    Norvasc 5 Mg Tabs (Amlodipine besylate) .[name]... Take one by mouth daily    Cardura 8 Mg Tabs (Doxazosin mesylate) .[name]... Take one by mouth daily  Orders: TLB-Lipid Panel (80061-LIPID) TLB-Renal  Function Panel (80069-RENAL) TLB-TSH (Thyroid Stimulating Hormone) (84443-TSH) TLB-ALT (SGPT) (84460-ALT) TLB-AST (SGOT) (84450-SGOT)  BP today: 122/84 Prior BP: 130/80 (01/06/2009)  Labs Reviewed: K+: 3.8 (06/26/2008) Creat: : 1.1 (06/26/2008)   Chol: 188 (06/26/2008)   HDL: 46.6 (06/26/2008)   LDL: 117 (06/26/2008)   TG: 123 (06/26/2008)  Problem # 4:  HYPOTHYROIDISM NOS (ICD-244.9) Assessment: Unchanged  check tsh today clinically stable Orders: TLB-Lipid Panel (80061-LIPID) TLB-Renal Function Panel (80069-RENAL) TLB-TSH (Thyroid Stimulating Hormone) (84443-TSH) TLB-ALT (SGPT) (84460-ALT) TLB-AST (SGOT) (84450-SGOT)  Labs Reviewed: TSH: 3.38 (10/05/2006)    HgBA1c: 5.9 (06/26/2008) Chol: 188 (06/26/2008)   HDL: 46.6 (06/26/2008)   LDL: 117 (06/26/2008)   TG: 123 (06/26/2008)  Complete Medication List: 1)  Metoprolol Tartrate 100 Mg Tabs (Metoprolol tartrate) .[name].. 1 by mouth two times a day 2)  Norvasc 5 Mg Tabs (Amlodipine besylate) .... Take one by mouth daily 3)  Cardura 8  Mg Tabs (Doxazosin mesylate) .... Take one by mouth daily 4)  Support Stockings To The Knee 15-20 Mm Hg  .... To wear on legs once daily as directed for edema  Other Orders: Venipuncture ([address])  Patient Instructions: 1)  labs today 2)  please consider changing your lifestyle for weight loss  3)  no change in medicines  4)  try support stockings to knee- for your ankle swelling  5)  schedule next fasting labs 6 mo and then follow up lipid/ast/alt/renal/ cbc with diff/tsh 272, 250.0, 401.1 Prescriptions: SUPPORT STOCKINGS TO THE KNEE 15-20 MM HG to wear on legs once daily as directed for edema  #1 pair x 1   Entered and Authorized by:   Judith Part MD   Signed by:   Judith Part MD on 04/23/2009   Method used:   Print then Give to Patient   RxID:   [id number]   Prior Medications (reviewed today): METOPROLOL TARTRATE 100 MG  TABS (METOPROLOL TARTRATE) 1 by mouth two times a  day NORVASC 5 MG  TABS (AMLODIPINE BESYLATE) take one by mouth daily CARDURA 8 MG  TABS (DOXAZOSIN MESYLATE) take one by mouth daily Current Allergies: ! ZOCOR ! AUGMENTIN   Preventive Care Screening  Contraindications of Treatment or Deferment of Test/Procedure:    Test/Procedure: FLU VAX    Reason for deferment: patient declined     Test/Procedure: Pneumovax vaccine    Reason for deferment: patient declined

## 2010-05-21 NOTE — Assessment & Plan Note (Signed)
Summary: 3 MONTH FOLLOW UP/[facility]   Vital Signs:  Patient Profile:   77 Years Old Female Weight:      253 pounds Temp:     97.4 degrees F oral Pulse rate:   64 / minute Pulse rhythm:   regular BP sitting:   124 / 70  (left arm) Cuff size:   large  Vitals Entered By: Liane Comber (January 01, 2008 8:53 AM)                 Chief Complaint:  3 month f/u.  History of Present Illness: wt is about the same -- not best wt loss effort thus far   bp is better today  no symptoms from her bp   did very well with diet for a while  then "fell off the wagon " -- ? why  has to cook food for her husband and daughter  her husband refuses to eat healthy foods   chol is up with LDL 134  AIC is down 5.8   has an itchy rash -under breasts  using cortisone 10 -- limited imp is trying not to scratch no d/c or bleeding       Current Allergies (reviewed today): No known allergies      Review of Systems  General      Denies fatigue, fever, loss of appetite, and malaise.  Eyes      Denies blurring and eye pain.  CV      Complains of swelling of feet.      Denies chest pain or discomfort, lightheadness, palpitations, and shortness of breath with exertion.      ankles stay a little swollen  Resp      Denies cough and wheezing.  GI      Denies change in bowel habits.  GU      Complains of incontinence.      Denies urinary frequency and urinary hesitancy.  Derm      Complains of itching and rash.  Neuro      Complains of tingling.      Denies numbness.      toes always tingle   Psych      mood is good   Endo      Denies excessive thirst and excessive urination.   Physical Exam  General:     overweight but generally well appearing  Head:     normocephalic, atraumatic, and no abnormalities observed.   Eyes:     vision grossly intact, pupils equal, pupils round, and pupils reactive to light.   Neck:     supple with full rom and no masses or  thyromegally, no JVD or carotid bruit  Chest Wall:     No deformities, masses, or tenderness noted. Lungs:     Normal respiratory effort, chest expands symmetrically. Lungs are clear to auscultation, no crackles or wheezes. Heart:     Normal rate and regular rhythm. S1 and S2 normal without gallop, murmur, click, rub or other extra sounds. Abdomen:     soft and non-tender.  no renal bruits  Pulses:     R and L carotid,radial,femoral,dorsalis pedis and posterior tibial pulses are full and equal bilaterally Extremities:     No clubbing, cyanosis, edema, or deformity noted with normal full range of motion of all joints.   Neurologic:     sensation intact to light touch, gait normal, and DTRs symmetrical and normal.  nl monofilament fingers and toes  Skin:  erythematous rash under both breasts -- extensive and slt raised, some satellite lesions  no skin breakdown or drainage  Cervical Nodes:     No lymphadenopathy noted Inguinal Nodes:     No significant adenopathy Psych:     normal affect, talkative and pleasant     Impression & Recommendations:  Problem # 1:  DIABETES MELLITUS, TYPE II (ICD-250.00) Assessment: Improved sugar control is better with better diet  will work hard on wt loss labs then flu 6 mo  Labs Reviewed: HgBA1c: 5.8 (12/27/2007)   Creat: 1.1 (09/12/2007)      Problem # 2:  OBESITY (ICD-278.00) Assessment: Unchanged work on  wt -- this will help many of health probs incl stress incontinence as well goal 5-10 lb by next visit in 6 mo pt knows what to do   Problem # 3:  HYPERTENSION (ICD-401.9) Assessment: Improved bp in good control-- imp today continue current meds wt loss enc  Her updated medication list for this problem includes:    Metoprolol Tartrate 100 Mg Tabs (Metoprolol tartrate) .[name]... 1 by mouth two times a day    Norvasc 5 Mg Tabs (Amlodipine besylate) .[name]... Take one by mouth daily    Cardura 8 Mg Tabs (Doxazosin mesylate) .[name]... Take one  by mouth daily  BP today: 124/70 Prior BP: 152/80 (09/26/2007)  Labs Reviewed: Creat: 1.1 (09/12/2007) Chol: 196 (12/27/2007)   HDL: 36.6 (12/27/2007)   LDL: 134 (12/27/2007)   TG: 127 (12/27/2007)   Problem # 4:  HYPERCHOLESTEROLEMIA, PURE (ICD-272.0) Assessment: Deteriorated chol up again--- adv to quit sausage/bacon/eggs/bis even though she has to cook for other people  disc risks of high chol  lab 6 mo then f/u Labs Reviewed: Chol: 196 (12/27/2007)   HDL: 36.6 (12/27/2007)   LDL: 134 (12/27/2007)   TG: 127 (12/27/2007) SGOT: 19 (12/27/2007)   SGPT: 18 (12/27/2007)   Problem # 5:  INTERTRIGO, CANDIDAL (ICD-695.89) Assessment: New with obesity and DM given px for nystatin powder to use daily under breasts  adv to keep area clean and dry  update if not imp  Complete Medication List: 1)  Metoprolol Tartrate 100 Mg Tabs (Metoprolol tartrate) .[name].. 1 by mouth two times a day 2)  Norvasc 5 Mg Tabs (Amlodipine besylate) .... Take one by mouth daily 3)  Cardura 8 Mg Tabs (Doxazosin mesylate) .... Take one by mouth daily 4)  Nystatin Powd (Nystatin) .... Apply to affected area once daily   Patient Instructions: 1)  your cholesterol is up again ---you can raise your HDL (good cholesterol) by increasing exercise and eating omega 3 fatty acid supplement like fish oil or flax seed oil over the counter 2)  you can lower LDL (bad cholesterol) by limiting saturated fats in diet like red meat, fried foods, egg yolks, fatty breakfast meats, high fat dairy products and shellfish 3)  your sugar is down 4)  keep working on diet and exercise  5)  use nystatin powder once daily on rash -- and then update me if not improving in 1-2 weeks  6)  fasting labs in 6 months lipid/ast/alt/AIC /renal in 6 mo and follow up 272, 250.0   Prescriptions: NYSTATIN  POWD (NYSTATIN) apply to affected area once daily  #1 med x 2   Entered and Authorized by:   Judith Part MD   Signed by:   Judith Part  MD on 01/01/2008   Method used:   Print then Give to Patient   RxID:   [id number]  ]

## 2010-05-21 NOTE — Assessment & Plan Note (Signed)
Summary: Valerie Ramirez 12/23/08 MK   Vital Signs:  Patient profile:   77 year old female Height:      63 inches Weight:      255 pounds BMI:     45.33 Temp:     97.4 degrees F oral Pulse rate:   64 / minute Pulse rhythm:   regular BP sitting:   120 / 68  (left arm) Cuff size:   large  Vitals Entered By: Liane Comber CMA (AAMA) (January 02, 2009 11:30 AM)  History of Present Illness: seen in ER on sept 6th after a fall had a mis-step on back stairs and fell on R side   hit her head and hand and knee CT of face showed hepatoma R eye - no facial fractures  severe deg spine changes in neck with osteophite   hand x ray fx of prox 5th phalanx ? chronic ligament tear  R knee x ray fine  Dr Amanda Pea did surgery with pins in her hand - immediately  it feels good in general- pain is not too bad  R leg really hurts her  started last friday -- turning red in lower leg -  then worse and felt like fire  pain goes from one spot on knee down to ankle  no fever  went to urgen care in  - on sunday -- and given her shot and some oral abx  augmentin- is giving her stomach upset  did venous doppler - and no blood clot seen   no improvement so far   saw Dr Amanda Pea yesterday- revised her cast  he looked at her leg- told perhaps infection-- and they wrapped it (this did not help )   Allergies: 1)  ! Zocor 2)  ! Augmentin  Past History:  Family History: Last updated: 19-Jul-2007 Father: died from MI Mother: died age 52 Siblings: 6 brothers- 1 died from cancer, 1 died from leukemia, 1 died from stomach cancer, 1 had CABG, 2 ok.  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 Eyes:  Denies blurring and eye pain. CV:  Denies chest pain or discomfort, palpitations, and shortness of breath with exertion. Resp:  Denies cough and shortness of breath. GI:  Complains of diarrhea; from her augmentin. GU:  Denies dysuria. MS:  Complains of joint pain, joint redness, and joint swelling. Derm:  Denies itching and poor wound healing. Neuro:  Denies numbness and tingling. Psych:  mood is ok- trying to keep up her spirits. Endo:  Denies excessive thirst and excessive urination. Heme:  Denies abnormal bruising and bleeding.  Physical Exam  General:  overweight but generally well appearing  Head:  old ecchymosis surrounding R eye no skin breakdown , minimal swelling  Eyes:  vision grossly intact, pupils equal, pupils round, and pupils reactive to light.   Mouth:  pharynx pink and moist.   Neck:  supple with full rom and  no masses or thyromegally, no JVD or carotid bruit  Lungs:  Normal respiratory effort, chest expands symmetrically. Lungs are clear to auscultation, no crackles or wheezes. Heart:  Normal rate and regular rhythm. S1 and S2 normal without gallop, murmur, click, rub or other extra sounds. Msk:  dry /healing abrasion with ecchymosis on R ant knee erythema and swelling over lateral/distal 2/3 of shin with tenderness no skin breakdown or weeping/drainage nl rom knee and ankle  no palp cords  calf is tender laterally over area of rash only   R arm in splint with ecchymosis Pulses:  R and L carotid,radial,femoral,dorsalis pedis and posterior tibial pulses are full  and equal bilaterally Extremities:  no swelling in L leg  Neurologic:  sensation intact to light touch, gait normal, and DTRs symmetrical and normal.   Skin:  see MS exam - for leg  Cervical Nodes:  No lymphadenopathy noted Psych:  normal affect, talkative and pleasant    Impression & Recommendations:  Problem # 1:  CELLULITIS, LEG, RIGHT (ICD-682.6) Assessment New s/p trauma and knee abrasion  prev x r knee nl  s/p tx with augmentin- not helping and also upsetting stomach per pt nl venous doppler this weekend consider poss mrsa (nothing to cx , however) will cover with septra ds  send for tib/fib films- r/o osteomyelitis  f/u monday update earlier if any inc in redness/swelling or any fever - pt voiced understanding  Her updated medication list for this problem includes:    Septra Ds 800-160 Mg Tabs (Sulfamethoxazole-trimethoprim) .[name]... 1 by mouth two times a day for 10 days  Orders: Radiology Referral (Radiology)  Complete Medication List: 1)  Metoprolol Tartrate 100 Mg Tabs (Metoprolol tartrate) .[name].. 1 by mouth two times a day 2)  Norvasc 5 Mg Tabs (Amlodipine besylate) .... Take one by mouth daily 3)  Cardura 8 Mg Tabs (Doxazosin mesylate) .... Take one by mouth daily 4)  Nystatin Powd (Nystatin) .... Apply to affected area once daily 5)  Zocor 20 Mg Tabs (Simvastatin) .[name].. 1 by mouth once daily in evening (4/10 holding for muscle pain) 6)  Septra Ds 800-160 Mg Tabs (Sulfamethoxazole-trimethoprim) .[name].. 1 by mouth two times a day for 10 days  Patient Instructions: 1)  stop the augmentin - since it is causing stomach upset 2)  start septra DS as directed  3)  keep leg elevated , can try a warm compress  4)  we will refer you for x ray of leg- when you check out  5)  if increase in redness/ swelling / fever or pain in leg- call to update me or go to ER if after hours  6)  follow up with me on monday for a re check Prescriptions: SEPTRA DS 800-160 MG TABS  (SULFAMETHOXAZOLE-TRIMETHOPRIM) 1 by mouth two times a day for 10 days  #20 x 0   Entered and Authorized by:   Judith Part MD   Signed by:   Judith Part MD on 01/02/2009   Method used:   Print then Give to Patient   RxID:   [id number]   Prior Medications (reviewed today): METOPROLOL TARTRATE 100 MG  TABS (METOPROLOL TARTRATE) 1 by mouth two times a day NORVASC 5 MG  TABS (AMLODIPINE BESYLATE) take one by mouth daily CARDURA 8 MG  TABS (DOXAZOSIN MESYLATE) take one by mouth daily NYSTATIN  POWD (NYSTATIN) apply to affected area once daily ZOCOR 20 MG TABS (SIMVASTATIN) 1 by mouth once daily in evening (4/10 holding  for muscle pain) SEPTRA DS 800-160 MG TABS (SULFAMETHOXAZOLE-TRIMETHOPRIM) 1 by mouth two times a day for 10 days Current Allergies (reviewed today): ! ZOCOR ! AUGMENTIN Current Medications (including changes made in today's visit):  METOPROLOL TARTRATE 100 MG  TABS (METOPROLOL TARTRATE) 1 by mouth two times a day NORVASC 5 MG  TABS (AMLODIPINE BESYLATE) take one by mouth daily CARDURA 8 MG  TABS (DOXAZOSIN MESYLATE) take one by mouth daily NYSTATIN  POWD (NYSTATIN) apply to affected area once daily ZOCOR 20 MG TABS (SIMVASTATIN) 1 by mouth once daily in evening (4/10 holding for muscle pain) SEPTRA DS 800-160 MG TABS (SULFAMETHOXAZOLE-TRIMETHOPRIM) 1 by mouth two times a day for 10 days

## 2010-05-21 NOTE — Assessment & Plan Note (Signed)
Summary: 6 M F/U DLO   Vital Signs:  Patient profile:   77 year old female Height:      63 inches Weight:      256 pounds BMI:     45.51 Temp:     97.6 degrees F oral Pulse rate:   56 / minute Pulse rhythm:   regular BP sitting:   130 / 80  (left arm) Cuff size:   large  Vitals Entered By: Liane Comber (July 03, 2008 9:13 AM)  History of Present Illness: has been feeling just fine  too cold to exercise -- nothing to do indoors   diet is doing some better - not as much bread  stays away from a lot of grease - but does like sausage   wt is up 3 lb   cholesteroll imp with HDL 46 and LDL 117- down from LDL in 478G pt aware of heart risks   has opthy exam planned in april- 1 year f/u -- is watching cataract on L (did well with surg on R)  AIC 5.9 with fasting sugar of 102- stable   bp stable 130/80 today       Allergies: No Known Drug Allergies  Past History:  Past Medical History:    Blood transfusion    Hypertension    Hypothyroidism    Osteoarthritis    hyperlipidemia    DM 2 6/09     (09/26/2007)  Past Surgical History:    Cholecystectomy    Tubal ligation    Total right knee arthroplasty (05/23/1998)    Total knee replacement (07/1998)    Polyp removal    Dexa- normal (09/1999)    Rib fracture    Carotid dopplers- normal (04/2004)    Wrist fracture (2007)    Kidney stones (2007)     (07/11/2007)  Family History:    Father: died from MI    Mother: died age 43    Siblings: 6 brothers- 1 died from cancer, 1 died from leukemia, 1 died from stomach cancer, 1 had CABG, 2 ok.  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 Eyes:  Denies blurring and eye pain. CV:  Denies chest pain or discomfort, palpitations, and shortness of  breath with exertion. Resp:  Denies cough, sputum productive, and wheezing. GI:  Denies abdominal pain and change in bowel habits. GU:  Denies urinary frequency. MS:  Denies joint pain. Derm:  Denies itching, lesion(s), poor wound healing, and rash. Neuro:  Complains of numbness; denies tingling; lateral toes numb ever since knee surg . Psych:  Denies anxiety and depression. Endo:  Denies cold intolerance, excessive thirst, excessive urination, and heat intolerance.  Physical Exam  General:  overweight but generally well appearing  Head:  normocephalic, atraumatic, and no abnormalities observed.   Neck:  supple with full rom and no masses or thyromegally, no JVD or carotid bruit  Lungs:  Normal respiratory effort, chest expands symmetrically. Lungs are clear to auscultation, no crackles or wheezes. Heart:  Normal rate and regular rhythm. S1 and S2 normal without gallop, murmur, click, rub or other extra sounds. Abdomen:  soft and non-tender.  no renal bruits  Msk:  No deformity or scoliosis  noted of thoracic or lumbar spine.   Pulses:  R and L carotid,radial,femoral,dorsalis pedis and posterior tibial pulses are full and equal bilaterally Extremities:  No clubbing, cyanosis, edema, or deformity noted with normal full range of motion of all joints.   Neurologic:  sensation intact to light touch, gait normal, and DTRs symmetrical and normal.   Skin:  Intact without suspicious lesions or rashes Cervical Nodes:  No lymphadenopathy noted Psych:  normal affect, talkative and pleasant   Diabetes Management Exam:    Foot Exam (with socks and/or shoes not present):       Sensory-Pinprick/Light touch:          Left medial foot (L-4): normal          Left dorsal foot (L-5): normal          Left lateral foot (S-1): normal          Right medial foot (L-4): normal          Right dorsal foot (L-5): normal          Right lateral foot (S-1): normal       Sensory-Monofilament:          Left foot:  normal          Right foot: normal       Sensory-other: despite self reported numbness- monofil nl        Inspection:          Left foot: normal          Right foot: normal       Nails:          Left foot: normal          Right foot: normal   Impression & Recommendations:  Problem # 1:  DIABETES MELLITUS, TYPE II (ICD-250.00) Assessment Unchanged mild with AIC under 6 disc healthy diet (low simple sugar/ choose complex carbs/ low sat fat) diet and exercise in detail disc imp of wt loss- pt is simply not motivated  opthy exam planned for april disc foot care  f/u 6 mo Labs Reviewed: Creat: 1.1 (06/26/2008)    HgBA1c: 5.9 (06/26/2008)  5.8 (12/27/2007)  Problem # 2:  HYPERTENSION (ICD-401.9) Assessment: Unchanged bp is stable - just in goal range  disc imp wt loss/ watch salt in diet  no change in meds  will tx chol f/u 6 mo  Her updated medication list for this problem includes:    Metoprolol Tartrate 100 Mg Tabs (Metoprolol tartrate) .[name]... 1 by mouth two times a day    Norvasc 5 Mg Tabs (Amlodipine besylate) .[name]... Take one by mouth daily    Cardura 8 Mg Tabs (Doxazosin mesylate) .[name]... Take one by mouth daily  BP today: 130/80 Prior BP: 124/70 (01/01/2008)  Labs Reviewed: Creat: 1.1 (06/26/2008) Chol: 188 (06/26/2008)   HDL: 46.6 (06/26/2008)   LDL: 117 (06/26/2008)   TG: 123 (06/26/2008)  Problem # 3:  HYPOTHYROIDISM NOS (ICD-244.9) Assessment: Unchanged will plan tsh with next labs no clinical changes - without thyroid suppl currently Labs Reviewed: TSH: 3.38 (10/05/2006)    HgBA1c: 5.9 (06/26/2008) Chol: 188 (06/26/2008)   HDL: 46.6 (06/26/2008)   LDL: 117 (06/26/2008)   TG: 123 (06/26/2008)  Problem # 4:  HYPERCHOLESTEROLEMIA, PURE (ICD-272.0) Assessment: Improved goal LDL 100- some imp with diet - but needs to be lower will add zocor 20- and update  labs 6 weeks  if tolerant of this - will likely start low dose asa for cardioprotection  at next visit    f/u 6 mo rev sat fat diet  Labs Reviewed: Chol: 188 (06/26/2008)   HDL: 46.6 (06/26/2008)   LDL: 117 (06/26/2008)   TG: 123 (06/26/2008) SGOT: 16 (06/26/2008)   SGPT: 22 (06/26/2008)  Her updated medication list for this problem includes:    Zocor 20 Mg Tabs (Simvastatin) .[name]... 1 by mouth once daily in evening  Complete Medication List: 1)  Metoprolol Tartrate 100 Mg Tabs (Metoprolol tartrate) .[name].. 1 by mouth two times a day 2)  Norvasc 5 Mg Tabs (Amlodipine besylate) .... Take one by mouth daily 3)  Cardura 8 Mg Tabs (Doxazosin mesylate) .... Take one by mouth daily 4)  Nystatin Powd (Nystatin) .... Apply to affected area once daily 5)  Zocor 20 Mg Tabs (Simvastatin) .[name].. 1 by mouth once daily in evening   Patient Instructions: 1)  work hard on weight loss- decrease calories and increase exercise 2)  think about exercise walking video - for indoor exercise when you cannot get outside  3)  aim for at least 20 minutes five days per week 4)  get your eye doctor appt in april as planned - ask them to check you for diabetes eye changes  5)  start zocor 20 mg for cholesterol 1 pill each evening  6)  plan fasting labs in 6 weeks lipid/ast/alt 272, and tsh 244.9 7)  if you have any side effects like muscle pain- stop it and let me know  8)  follow up in 6 months  9)  The medication list was reviewed and reconciled.  All changed / newly prescribed medications were explained.  A complete medication list was provided to the patient / caregiver. Prescriptions: ZOCOR 20 MG TABS (SIMVASTATIN) 1 by mouth once daily in evening  #90 x 3   Entered and Authorized by:   Judith Part MD   Signed by:   Judith Part MD on 07/03/2008   Method used:   Print then Give to Patient   RxID:   [id number] CARDURA 8 MG  TABS (DOXAZOSIN MESYLATE) take one by mouth daily  #90 x 3   Entered and Authorized by:   Judith Part MD   Signed by:   Judith Part MD on 07/03/2008   Method used:   Print  then Give to Patient   RxID:   [id number] NORVASC 5 MG  TABS (AMLODIPINE BESYLATE) take one by mouth daily  #90 x 3   Entered and Authorized by:   Judith Part MD   Signed by:   Judith Part MD on 07/03/2008   Method used:   Print then Give to Patient   RxID:   [id number] METOPROLOL TARTRATE 100 MG  TABS (METOPROLOL TARTRATE) 1 by mouth two times a day  #180 x 3   Entered and Authorized by:   Judith Part MD   Signed by:   Judith Part MD on 07/03/2008   Method used:   Print then Give to Patient   RxID:   [id number]   Prior Medications (reviewed today): METOPROLOL TARTRATE 100 MG  TABS (METOPROLOL TARTRATE) 1 by mouth two times a day NORVASC 5 MG  TABS (AMLODIPINE BESYLATE) take one by mouth daily CARDURA 8 MG  TABS (DOXAZOSIN MESYLATE) take one by mouth daily NYSTATIN  POWD (NYSTATIN) apply to affected area once daily ZOCOR 20 MG TABS (SIMVASTATIN) 1 by mouth once daily in evening Current Allergies (reviewed today): No known allergies  Current Medications (including changes made in today's visit):  METOPROLOL TARTRATE 100 MG  TABS (METOPROLOL TARTRATE) 1 by mouth two times a day NORVASC 5 MG  TABS (AMLODIPINE BESYLATE) take one by mouth daily CARDURA 8 MG  TABS (DOXAZOSIN MESYLATE) take one by mouth daily NYSTATIN  POWD (NYSTATIN) apply to affected area once daily ZOCOR 20 MG TABS (SIMVASTATIN) 1 by mouth once daily in evening

## 2010-05-21 NOTE — Assessment & Plan Note (Signed)
Summary: F/U ON FRI FOR RECHECK PER DR. Milinda Antis / [facility]   Vital Signs:  Patient profile:   77 year old female Height:      63 inches Weight:      254.75 pounds BMI:     45.29 Temp:     97.7 degrees F oral Pulse rate:   60 / minute Pulse rhythm:   regular BP sitting:   122 / 66  (left arm) Cuff size:   large  Vitals Entered By: Lewanda Rife LPN (July 26, 7167 8:24 AM) CC: follow up for left big toe and right ankle swollen, painful and red   History of Present Illness: L toe is looking better but pretty sore  no fever  not draining anything   dressing it and keeping it clean   now her R ankle hurts and is swollen and a little red  no hx of gout  no injury recently (but did fall last sept) --hit knee on that side  feels like it is injured to move it -- worst to point toe able to bear wt with no problem     Allergies: 1)  ! Zocor 2)  ! Augmentin  Past History:  Past Medical History: Last updated: 05/27/2009 Blood transfusion Hypertension Hypothyroidism Osteoarthritis hyperlipidemia DM 2 6/09---------------pt ref flu or pneumo vaccines   hand surg- Dr Amanda Pea  Past Surgical History: Last updated: 01/02/2009 Cholecystectomy Tubal ligation Total right knee arthroplasty (05/23/1998) Total knee replacement (07/1998) Polyp removal Dexa- normal (09/1999) Rib fracture Carotid dopplers- normal (04/2004) Wrist fracture (2007) Kidney stones (2007) hand fx with surgery after a fall 9/10  Family History: Last updated: Aug 01, 2007 Father: died from MI Mother: died age 41 Siblings: 6 brothers- 1 died from cancer, 1 died from leukemia, 1 died from stomach cancer, 1 had CABG, 2 ok.  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 Eyes:  Denies blurring and eye irritation. CV:  Denies chest pain or discomfort and palpitations. Resp:  Denies shortness of breath. GI:  Denies abdominal pain, change in bowel habits, and indigestion. MS:  Complains of joint pain, joint redness, joint swelling, and stiffness; denies cramps and muscle weakness. Derm:  Denies itching, poor wound healing, and rash. Neuro:  Denies numbness, tingling, and weakness. Endo:  Denies excessive thirst and excessive urination. Heme:  Denies abnormal bruising and bleeding.  Physical Exam  General:  overweight but generally well appearing  Head:  normocephalic, atraumatic, and no abnormalities observed.   Neck:  nl rom - no tenderness  Heart:  Normal rate and regular rhythm. S1 and S2 normal without gallop, murmur, click, rub or other extra sounds. Msk:  R ankle - mildly swollen without pitting mild bilat malleolus tenderness most pain to plantar flex foot  nl inversion/eversion and flex  no redness or warmth  ant shin has mild redness -- per pt this is baseline no calf tenderness no foot or mcp tenderness perfusion intact Pulses:  R and L carotid,radial,femoral,dorsalis pedis and posterior tibial pulses are full and equal bilaterally Neurologic:  sensation intact to light touch, gait normal, and DTRs symmetrical and normal.   Skin:  L great toe looks much improved less erythema and swelling prox to nail blister is dry- no drainage  not  tender nl rom  pt c/o some throbbing when not moving it  no change in perfusion Psych:  normal affect, talkative and pleasant    Impression & Recommendations:  Problem # 1:  CELLULITIS, GREAT TOE, LEFT (ICD-681.10) Assessment Improved much improved on exam- but per pt some soreness send for x ray today to r/o osteomyelitis  update if worse finish septra  dressed with abx oint and new gauze/ tubular prelim wound cx shows few gram neg rods-- pend final report and sens- hopefully  soon Her updated medication list for this problem includes:    Septra Ds 800-160 Mg Tabs (Sulfamethoxazole-trimethoprim) .[name]... 1 by mouth two times a day for 10 days for toe infection  Orders: Radiology Referral (Radiology)  Problem # 2:  ANKLE PAIN, RIGHT (ICD-719.47) Assessment: New new R ankle pain ? if from favoring that foot with toe infx in the other one hx of remote trauma -- ? if OA  not consistent with gout on exam - not warm or red  get x ray today as well  Orders: Radiology Referral (Radiology)  Complete Medication List: 1)  Metoprolol Tartrate 100 Mg Tabs (Metoprolol tartrate) .[name].. 1 by mouth two times a day 2)  Norvasc 5 Mg Tabs (Amlodipine besylate) .... Take one by mouth daily 3)  Cardura 8 Mg Tabs (Doxazosin mesylate) .... Take one by mouth daily 4)  Support Stockings To The Knee 15-20 Mm Hg  .... To wear on legs once daily as directed for edema 5)  Lisinopril 5 Mg Tabs (Lisinopril) .... Take one by mouth daily 6)  Vitamin B-12 500 Mcg Tabs (Cyanocobalamin) .... Otc as directed. 7)  Septra Ds 800-160 Mg Tabs (Sulfamethoxazole-trimethoprim) .[name].. 1 by mouth two times a day for 10 days for toe infection  Patient Instructions: 1)  continue the antibiotics and dressing changes  2)  toe looks improved  3)  update me if worse pain or swelling  4)  we will refer you for x rays at check out -- Shirlee Limerick please ask radiology to call a report, thanks   Current Allergies (reviewed today): ! ZOCOR ! AUGMENTIN

## 2010-05-21 NOTE — Assessment & Plan Note (Signed)
Summary: infected toe/ alc   Vital Signs:  Patient profile:   77 year old female Height:      63 inches Weight:      258.75 pounds BMI:     46.00 Temp:     97.6 degrees F oral Pulse rate:   64 / minute Pulse rhythm:   regular BP sitting:   128 / 78  (left arm) Cuff size:   large  Vitals Entered By: Lewanda Rife LPN (July 22, 9809 8:56 AM) CC: ?Left big toe infected   History of Present Illness: L big toe is painful and red started wed night no fever  does drain some pus at times  does not remeber any injury- just popped up   no numbness in feet   uses peroxide and first aid abx cream   Allergies: 1)  ! Zocor 2)  ! Augmentin  Past History:  Past Medical History: Last updated: 05/27/2009 Blood transfusion Hypertension Hypothyroidism Osteoarthritis hyperlipidemia DM 2 6/09---------------pt ref flu or pneumo vaccines   hand surg- Dr Amanda Pea  Past Surgical History: Last updated: 01/02/2009 Cholecystectomy Tubal ligation Total right knee arthroplasty (05/23/1998) Total knee replacement (07/1998) Polyp removal Dexa- normal (09/1999) Rib fracture Carotid dopplers- normal (04/2004) Wrist fracture (2007) Kidney stones (2007) hand fx with surgery after a fall 9/10  Family History: Last updated: 08-Aug-2007 Father: died from MI Mother: died age 75 Siblings: 6 brothers- 1 died from cancer, 1 died from leukemia, 1 died from stomach cancer, 1 had CABG, 2 ok.  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 Eyes:  Denies blurring and eye irritation. CV:  Denies chest pain or discomfort and palpitations. Resp:  Denies cough. GI:  Denies nausea and vomiting. MS:  Denies joint pain. Derm:  Denies itching and rash. Neuro:   Denies numbness and tingling. Heme:  Denies abnormal bruising and bleeding.  Physical Exam  General:  overweight but generally well appearing  Neck:  No deformities, masses, or tenderness noted. Lungs:  Normal respiratory effort, chest expands symmetrically. Lungs are clear to auscultation, no crackles or wheezes. Heart:  Normal rate and regular rhythm. S1 and S2 normal without gallop, murmur, click, rub or other extra sounds. Msk:  see skin exam  Pulses:  R and L carotid,radial,femoral,dorsalis pedis and posterior tibial pulses are full and equal bilaterally Skin:  L great toe- erythema and swelling prox to nail with blister minimally tender nl perfusion  nl rom  cx obtained  and dressed with polysporin and gauze  Psych:  normal affect, talkative and pleasant    Impression & Recommendations:  Problem # 1:  CELLULITIS, GREAT TOE, LEFT (ICD-681.10) Assessment New with redness proximal to nail (thickened but not ingrown) contents of blister exp for cx emp cover for mrsa with sulfa disc dressing changes  adv if worse - update or seek care asap re check fri Her updated medication list for this problem includes:    Septra Ds 800-160 Mg Tabs (Sulfamethoxazole-trimethoprim) .[name]... 1 by mouth two times a day for 10 days for toe infection  Orders: T-Culture, Wound ([id number]) Specimen Handling ([id number])  Complete Medication List: 1)  Metoprolol Tartrate 100 Mg Tabs (Metoprolol tartrate) .[name].. 1 by mouth two times a day 2)  Norvasc 5 Mg Tabs (Amlodipine besylate) .Marland KitchenMarland KitchenMarland Kitchen  Take one by mouth daily 3)  Cardura 8 Mg Tabs (Doxazosin mesylate) .... Take one by mouth daily 4)  Support Stockings To The Knee 15-20 Mm Hg  .... To wear on legs once daily as directed for edema 5)  Lisinopril 5 Mg Tabs (Lisinopril) .... Take one by mouth daily 6)  Vitamin B-12 500 Mcg Tabs (Cyanocobalamin) .... Otc as directed. 7)  Septra Ds 800-160 Mg Tabs (Sulfamethoxazole-trimethoprim) .[name].. 1 by mouth two  times a day for 10 days for toe infection  Patient Instructions: 1)  keep toe clean with antibacterial soap and water -- no peroxide 2)  rubbing alcohol is ok  3)  use any antibiotic ointment except neosporin over the counter 4)  dress loosely with bandaid or dry gauze 5)  if fever or increased redness or swelling- call me asap 6)  follow up on friday for a recheck with me  Prescriptions: SEPTRA DS 800-160 MG TABS (SULFAMETHOXAZOLE-TRIMETHOPRIM) 1 by mouth two times a day for 10 days for toe infection  #20 x 0   Entered and Authorized by:   Judith Part MD   Signed by:   Judith Part MD on 07/22/2009   Method used:   Print then Give to Patient   RxID:   [id number]   Current Allergies (reviewed today): ! ZOCOR ! AUGMENTIN

## 2010-05-21 NOTE — Assessment & Plan Note (Signed)
Summary: FOLLOW UP/RI   Vital Signs:  Patient profile:   77 year old female Height:      63 inches Weight:      258 pounds BMI:     45.87 Temp:     97.4 degrees F oral Pulse rate:   80 / minute Pulse rhythm:   regular BP sitting:   126 / 74  (left arm) Cuff size:   large  Vitals Entered By: Lewanda Rife LPN (February 25, 2010 8:33 AM) CC: follow-up visit   History of Present Illness: here for re check of neck pain and headache   still not feeling up to par    esr came back in 30s and pt was feeling better   did say she had a nosebleed since last visit  gave he quite a relief from headache and neck pain  pain got better for 1/2 day and then it would start back mild and then nose bleeds again  some times worse than other times   pain in R side of head is now feeling almost numb  scalp is even sensitive and sore  prednisone made no difference at all  no rash  neck is still sore across shoulders but not as painful as it was  most of pain is on R side of her head   takes some aspirins when the pain gets bad and this does help   never had headaches before    Allergies: 1)  ! Zocor 2)  ! Augmentin  Past History:  Past Medical History: Last updated: 05/27/2009 Blood transfusion Hypertension Hypothyroidism Osteoarthritis hyperlipidemia DM 2 6/09---------------pt ref flu or pneumo vaccines   hand surg- Dr Amanda Pea  Past Surgical History: Last updated: 01/02/2009 Cholecystectomy Tubal ligation Total right knee arthroplasty (05/23/1998) Total knee replacement (07/1998) Polyp removal Dexa- normal (09/1999) Rib fracture Carotid dopplers- normal (04/2004) Wrist fracture (2007) Kidney stones (2007) hand fx with surgery after a fall 9/10  Family History: Last updated: July 21, 2007 Father: died from MI Mother: died age 44 Siblings: 6 brothers- 1 died from cancer, 1 died from leukemia, 1 died from stomach cancer, 1 had CABG, 2 ok.  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 Eyes:  Denies blurring, eye irritation, eye pain, itching, light sensitivity, vision loss-1 eye, and vision loss-both eyes. ENT:  Denies sinus pressure and sore throat. CV:  Denies chest pain or discomfort, palpitations, and swelling of feet. Resp:  Denies cough, shortness of breath, and wheezing. GI:  Denies abdominal pain, change in bowel habits, and nausea. GU:  Denies dysuria and urinary frequency. MS:  Denies muscle aches and cramps. Derm:  Denies itching, lesion(s), poor wound healing, and rash. Neuro:  Complains of headaches; denies difficulty with concentration, disturbances in coordination, numbness, poor balance, sensation of room spinning, and tingling. Psych:  Denies anxiety and depression. Endo:  Denies cold intolerance, excessive thirst, excessive urination, heat intolerance, and polyuria. Heme:  Denies abnormal bruising and bleeding.  Physical Exam  General:  overweight but generally well appearing  Head:  normocephalic, atraumatic, and no abnormalities observed.  no sinus or temporal tenderness Eyes:  vision grossly intact, pupils equal, pupils round, and pupils reactive to light.  no conjunctival pallor, injection or icterus  Ears:  R ear normal and L ear normal.   Nose:  no nasal discharge.   Mouth:  pharynx pink and moist.   Neck:  supple with full rom and no masses or thyromegally, no JVD or carotid bruit  no bony tenderness nl rom  Chest Wall:  No deformities, masses, or tenderness noted. Lungs:  Normal respiratory effort, chest expands symmetrically. Lungs are clear to auscultation, no crackles or wheezes. Heart:  Normal rate and regular rhythm. S1 and S2 normal without  gallop, murmur, click, rub or other extra sounds. Msk:  No deformity or scoliosis noted of thoracic or lumbar spine.  no trapizius or myofascial trigger points  Pulses:  R and L carotid,radial,femoral,dorsalis pedis and posterior tibial pulses are full and equal bilaterally Extremities:  No clubbing, cyanosis, edema, or deformity noted with normal full range of motion of all joints.   Neurologic:  alert & oriented X3, cranial nerves II-XII intact, strength normal in all extremities, sensation intact to light touch, gait normal, and DTRs symmetrical and normal.    is tender over R occiput Skin:  no rash on scalp or other areas of pain  Cervical Nodes:  No lymphadenopathy noted Psych:  pt is very frustrated by pain otherwise pleasant and talkative    Impression & Recommendations:  Problem # 1:  HEADACHE (ICD-784.0) Assessment Deteriorated  this waxes and wanes- at times severe  (no imp with prednisone or flexeril )  borderline esr and no temporal tenderness  some nosebleeds (nl bp)-- may be worsened by asa  in light of age and new ha - will order mri and mra of brain -- pend result  if neg consider re check esr/ crp and ref neuro in light of nosebleed - disc asa // adv to try advil if needed with food  Her updated medication list for this problem includes:    Metoprolol Tartrate 100 Mg Tabs (Metoprolol tartrate) .[name]... 1 by mouth two times a day  Orders: Radiology Referral (Radiology)  Complete Medication List: 1)  Metoprolol Tartrate 100 Mg Tabs (Metoprolol tartrate) .[name].. 1 by mouth two times a day 2)  Norvasc 5 Mg Tabs (Amlodipine besylate) .... Take one by mouth daily 3)  Cardura 8 Mg Tabs (Doxazosin mesylate) .... Take one by mouth daily 4)  Support Stockings To The Knee 15-20 Mm Hg  .... To wear on legs once daily as directed for edema 5)  Lisinopril 5 Mg Tabs (Lisinopril) .... Take one by mouth daily 6)  Vitamin B-12 500 Mcg Tabs (Cyanocobalamin) .... Otc as directed. 7)   Lipitor 10 Mg Tabs (Atorvastatin calcium) .[name].. 1 by mouth once daily in evening 8)  Flexeril 10 Mg Tabs (Cyclobenzaprine hcl) .[name].. 1 by mouth three times a day as needed neck pain  Patient Instructions: 1)  we will refer you for MRI at check out  2)  stop aspirin  3)  try advil/ ibuprofen 400 mg up to every 6 hours with food as needed for pain  4)  I will update you with result  5)  if you pain at any times becomes severe / worse- go to ER   Orders Added: 1)  Radiology Referral [Radiology] 2)  Est. Patient Level IV [11914]    Current Allergies (reviewed today): ! ZOCOR ! AUGMENTIN

## 2010-05-21 NOTE — Progress Notes (Signed)
Summary: can't take zocor  Phone Note Call from Patient Call back at Home Phone [phone]   Caller: Patient Call For: Judith Part MD Summary of Call: Pt called to report that she cant take zocor.  She had muscle pain with it.  If you want to change her to something else she uses [facility] in [place]. Initial call taken by: Lowella Petties,  July 29, 2008 11:25 AM  Follow-up for Phone Call        hold it for 2 weeks- and update me with how pain is- before I start another agent noted on med list /all list  Follow-up by: Judith Part MD,  July 29, 2008 2:10 PM  Additional Follow-up for Phone Call Additional follow up Details #1::        left message  to call me back Liane Comber  July 29, 2008 3:23 PM   Advised patient.  ......................................................Marland KitchenLiane Comber July 30, 2008 8:49 AM    New Allergies: ! ZOCOR New/Updated Medications: ZOCOR 20 MG TABS (SIMVASTATIN) 1 by mouth once daily in evening (4/10 holding for muscle pain) New Allergies: ! ZOCOR

## 2010-05-21 NOTE — Assessment & Plan Note (Signed)
Summary: 6 MONTH FOLLOW UP/[facility]   Vital Signs:  Patient profile:   77 year old female Height:      63 inches Weight:      260.75 pounds BMI:     46.36 Temp:     98 degrees F oral Pulse rate:   60 / minute Pulse rhythm:   regular BP sitting:   120 / 64  (left arm) Cuff size:   large  Vitals Entered By: Lewanda Rife LPN (October 22, 1608 8:41 AM) CC: six month f/u   History of Present Illness: here for f/u of HTNand lipids and DM   not feeling too bad   shoulders are hurting - pain esp in R side - just a pain  some R middle of back pain (does not feel like a kidney stone )  has hx of broken ribs there- ? 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 Zocor 2)  ! Augmentin  Past History:  Past Medical History: Last updated: 05/27/2009 Blood transfusion Hypertension Hypothyroidism Osteoarthritis hyperlipidemia DM 2 6/09---------------pt ref flu or pneumo vaccines   hand surg- Dr Amanda Pea  Past Surgical History: Last updated: 01/02/2009 Cholecystectomy Tubal ligation Total right knee arthroplasty (05/23/1998) Total knee replacement (07/1998) Polyp removal Dexa- normal (09/1999) Rib fracture Carotid dopplers- normal (04/2004) Wrist fracture (2007) Kidney stones (2007) hand fx with surgery after a fall 9/10  Family History: Last updated: 07-26-2007 Father: died from MI Mother:  died age 61 Siblings: 6 brothers- 1 died from cancer, 1 died from leukemia, 1 died from stomach cancer, 1 had CABG, 2 ok.  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 Eyes:  Denies blurring, eye irritation, and eye pain. CV:  Denies chest pain or discomfort, palpitations, and shortness of breath with exertion. Resp:  Denies cough, shortness of breath, and wheezing. GI:  Denies abdominal pain, change in bowel habits, indigestion, loss of appetite, and nausea. GU:  Denies dysuria. MS:  Complains of joint pain and mid back pain; denies joint redness and joint swelling. Derm:  Complains of rash; denies itching and poor wound healing. Neuro:  Denies headaches, numbness, and tingling. Endo:  Denies cold intolerance and excessive hunger.  Physical Exam  General:  overweight but generally well appearing  Head:  normocephalic, atraumatic, and no abnormalities observed.   Eyes:  vision grossly intact, pupils equal, pupils round, and pupils reactive to light.   Mouth:  pharynx pink and moist.   Neck:  supple with full rom and no masses or thyromegally, no JVD or carotid bruit  Lungs:  Normal respiratory effort, chest expands symmetrically. Lungs are clear to auscultation, no crackles or wheezes. Heart:  Normal rate and regular rhythm. S1 and S2 normal without gallop, murmur, click, rub or other extra sounds. Abdomen:  Bowel sounds positive,abdomen soft and non-tender without masses, organomegaly or hernias noted. no renal bruits  Msk:  No deformity or scoliosis noted of thoracic or lumbar spine.  nl rom shoulders- some pain on full abduction of both Pulses:  R and L carotid,radial,femoral,dorsalis pedis and posterior tibial pulses are full and equal  bilaterally Extremities:  trace edema with supp hose Neurologic:  sensation intact to light touch, gait normal, and DTRs symmetrical and normal.   Skin:  bilat lower legs and ankles are red with some scale  no vesicles  no excoriations very slt warmth Cervical Nodes:  No lymphadenopathy noted Inguinal Nodes:  No significant adenopathy Psych:  normal affect, talkative and pleasant   Diabetes Management Exam:    Foot Exam (with socks and/or shoes not present):       Sensory-Pinprick/Light touch:          Left medial foot (L-4): normal          Left dorsal foot (L-5): normal          Left lateral foot (S-1): normal          Right medial foot (L-4): normal          Right dorsal foot (L-5): normal          Right lateral foot (S-1): normal       Sensory-Monofilament:          Left foot: normal          Right foot: normal       Inspection:          Left foot: normal          Right foot: normal       Nails:          Left foot: normal          Right foot: normal   Impression & Recommendations:  Problem # 1:  RASH AND OTHER NONSPECIFIC SKIN ERUPTION (ICD-782.1) Assessment New ongoing redness in legs/ ankles worse on R with no imp after abx did not tol supp hose ? from venous insuff ref to derm for eval Orders: Dermatology Referral (Derma)  Problem # 2:  DIABETES MELLITUS, TYPE II (ICD-250.00) Assessment: Deteriorated  inc AIC but still fairly good long disc about lifestyle change disc healthy diet (low simple sugar/ choose complex carbs/ low sat fat) diet and exercise in detail  lab 3 mo and update Her updated medication list for this problem includes:    Lisinopril 5 Mg Tabs (Lisinopril) .[name]... Take one by mouth daily  Labs Reviewed: Creat: 1.0 (10/17/2009)     Last Eye Exam: diabetic retinopathy (11/17/2008) Reviewed HgBA1c results: 6.1 (10/17/2009)  5.8 (04/23/2009)  Problem # 3:  HYPERTENSION (ICD-401.9) Assessment: Unchanged  very good control on current meds    no change disc imp of wt loss Her updated medication list for this problem includes:    Metoprolol Tartrate 100 Mg Tabs (Metoprolol tartrate) .[name]... 1 by mouth two times a day    Norvasc 5 Mg Tabs (Amlodipine besylate) .[name]... Take one by mouth daily    Cardura 8 Mg Tabs (Doxazosin mesylate) .[name]... Take one by mouth daily  Lisinopril 5 Mg Tabs (Lisinopril) .[name]... Take one by mouth daily  BP today: 120/64 Prior BP: 122/66 (07/25/2009)  Labs Reviewed: K+: 4.2 (10/17/2009) Creat: : 1.0 (10/17/2009)   Chol: 207 (10/17/2009)   HDL: 50.40 (10/17/2009)   LDL: 113 (04/23/2009)   TG: 134.0 (10/17/2009)  Problem # 4:  HYPERCHOLESTEROLEMIA, PURE (ICD-272.0) Assessment: Deteriorated  this is worse due to poor diet and lack of exercise  disc statin- pt not ready rev low sat fat diet  f/u in 3 mo after labs   Labs Reviewed: SGOT: 18 (10/17/2009)   SGPT: 15 (10/17/2009)   HDL:50.40 (10/17/2009), 46.70 (04/23/2009)  LDL:113 (04/23/2009), 117 (06/26/2008)  Chol:207 (10/17/2009), 192 (04/23/2009)  Trig:134.0 (10/17/2009), 162.0 (04/23/2009)  Complete Medication List: 1)  Metoprolol Tartrate 100 Mg Tabs (Metoprolol tartrate) .[name].. 1 by mouth two times a day 2)  Norvasc 5 Mg Tabs (Amlodipine besylate) .... Take one by mouth daily 3)  Cardura 8 Mg Tabs (Doxazosin mesylate) .... Take one by mouth daily 4)  Support Stockings To The Knee 15-20 Mm Hg  .... To wear on legs once daily as directed for edema 5)  Lisinopril 5 Mg Tabs (Lisinopril) .... Take one by mouth daily 6)  Vitamin B-12 500 Mcg Tabs (Cyanocobalamin) .... Otc as directed.  Patient Instructions: 1)  please make effort for diabetic diet low in fats and weight loss 2)  ultirmately 20 extra minutes of exercise 5 days per week- bike or walking  3)  this will increase energy and help you loose weight  4)  also will help your cholesterol and blood sugar  5)  we will do dermatology referral at check out  6)  let me know if shoulder pain  continues  7)  schedule fasting labs in 3 months AIC / renal/ ast/alt/ lipid / 272, 250.0 8)  then follow up   Current Allergies (reviewed today): ! ZOCOR ! AUGMENTIN

## 2010-05-21 NOTE — Assessment & Plan Note (Signed)
Summary: 3 MONTH FOLLOW UPR/BH   Vital Signs:  Patient profile:   77 year old female Height:      63 inches Weight:      257.75 pounds BMI:     45.82 Temp:     98.1 degrees F oral Pulse rate:   64 / minute Pulse rhythm:   regular BP sitting:   118 / 70  (left arm) Cuff size:   large  Vitals Entered By: Lewanda Rife LPN (January 26, 2010 8:30 AM) CC: three month f/u   History of Present Illness: here for 3 mo f/u of HTN and lipids and DM   wt is down 3 lb has not weighed herself at home   bp is good at 118/70 is on ace   DM is stable with 6.2 AIC - diet controlled  stays away from sugars and carbs most of the time-- cheats 2 times per month is getting exercise -- working outside to make molasses  is getting at least 30 minutes per day    LDL chol still in 140s diet --once in a while eats baked chicken/ occ fried / sausage is the other thing  avoids beef  unable to tol zocor would be open to other med   does not take flu shots   occ gets a tickle in throat with a cough  aware it could be from her ace  is tolerable   Allergies: 1)  ! Zocor 2)  ! Augmentin  Past History:  Past Medical History: Last updated: 05/27/2009 Blood transfusion Hypertension Hypothyroidism Osteoarthritis hyperlipidemia DM 2 6/09---------------pt ref flu or pneumo vaccines   hand surg- Dr Amanda Pea  Past Surgical History: Last updated: 01/02/2009 Cholecystectomy Tubal ligation Total right knee arthroplasty (05/23/1998) Total knee replacement (07/1998) Polyp removal Dexa- normal (09/1999) Rib fracture Carotid dopplers- normal (04/2004) Wrist fracture (2007) Kidney stones (2007) hand fx with surgery after a fall 9/10  Family History: Last updated: Jul 31, 2007 Father: died from MI Mother: died age 61 Siblings: 6 brothers- 1 died from cancer, 1 died from leukemia, 1 died from stomach cancer, 1 had CABG, 2 ok.  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 Eyes:  Denies blurring and eye irritation. CV:  Denies chest pain or discomfort, lightheadness, and palpitations. Resp:  Complains of cough; denies pleuritic, shortness of breath, and wheezing. GI:  Denies abdominal pain, change in bowel habits, indigestion, and nausea. GU:  Denies urinary frequency. Derm:  Denies itching, lesion(s), poor wound healing, and rash. Neuro:  Denies headaches, numbness, and tingling. Psych:  Denies anxiety and depression.  Physical Exam  General:  overweight but generally well appearing  Head:  normocephalic, atraumatic, and no abnormalities observed.   Mouth:  pharynx pink and moist.   Neck:  supple with full rom and no masses or thyromegally, no JVD or carotid bruit  Lungs:  Normal respiratory effort, chest expands symmetrically. Lungs are clear to auscultation, no crackles or wheezes. Heart:  Normal rate and regular rhythm. S1 and S2 normal without gallop, murmur, click, rub or other extra sounds. Abdomen:  soft, non-tender, and normal bowel sounds.  no renal bruits  Msk:  No deformity or scoliosis noted of thoracic or lumbar spine.   Extremities:  trace edema with supp hose  Neurologic:  sensation intact to light touch, gait normal, and DTRs symmetrical and normal.   Skin:  bilat lower legs and ankles are red with some scale  no vesicles  no excoriations very slt warmth Cervical Nodes:  No lymphadenopathy noted Inguinal Nodes:  No significant adenopathy Psych:  normal affect, talkative and pleasant   Diabetes Management Exam:    Foot Exam (with socks and/or shoes not present):       Sensory-Pinprick/Light touch:          Left medial foot (L-4): diminished          Left dorsal foot (L-5): diminished          Left  lateral foot (S-1): diminished          Right medial foot (L-4): diminished          Right dorsal foot (L-5): diminished          Right lateral foot (S-1): diminished   Impression & Recommendations:  Problem # 1:  DIABETES MELLITUS, TYPE II (ICD-250.00) Assessment Unchanged  this is stable and diet controlled will change ace if cough worsens disc neuropathy-- will wear good shoes and check feet nightly disc healthy diet (low simple sugar/ choose complex carbs/ low sat fat) diet and exercise in detail  stressed imp of wt loss utd opthy Her updated medication list for this problem includes:    Lisinopril 5 Mg Tabs (Lisinopril) .[name]... Take one by mouth daily  Labs Reviewed: Creat: 1.0 (01/21/2010)     Last Eye Exam: diabetic retinopathy (11/17/2008) Reviewed HgBA1c results: 6.2 (01/21/2010)  6.1 (10/17/2009)  Problem # 2:  HYPERTENSION (ICD-401.9) Assessment: Unchanged  stable on current meds- no changes  will change ace if cough continues  Her updated medication list for this problem includes:    Metoprolol Tartrate 100 Mg Tabs (Metoprolol tartrate) .[name]... 1 by mouth two times a day    Norvasc 5 Mg Tabs (Amlodipine besylate) .[name]... Take one by mouth daily    Cardura 8 Mg Tabs (Doxazosin mesylate) .[name]... Take one by mouth daily    Lisinopril 5 Mg Tabs (Lisinopril) .[name]... Take one by mouth daily  BP today: 118/70 Prior BP: 120/64 (10/22/2009)  Labs Reviewed: K+: 4.2 (01/21/2010) Creat: : 1.0 (01/21/2010)   Chol: 207 (01/21/2010)   HDL: 51.30 (01/21/2010)   LDL: 113 (04/23/2009)   TG: 121.0 (01/21/2010)  Problem # 3:  HYPERCHOLESTEROLEMIA, PURE (ICD-272.0) Assessment: Unchanged  still not at goal with fair diet  again rev low sat fat diet  trial of lipitor and lab 6 weeks -unless side eff (disc) Her updated medication list for this problem includes:    Lipitor 10 Mg Tabs (Atorvastatin calcium) .[name]... 1 by mouth once daily in evening  Labs Reviewed: SGOT: 19 (01/21/2010)    SGPT: 17 (01/21/2010)   HDL:51.30 (01/21/2010), 50.40 (10/17/2009)  LDL:113 (04/23/2009), 117 (06/26/2008)  Chol:207 (01/21/2010), 207 (10/17/2009)  Trig:121.0 (01/21/2010), 134.0 (10/17/2009)  Orders: Prescription Created Electronically [phone])  Problem # 4:  OBESITY (ICD-278.00) Assessment: Comment Only again disc imp of wt loss to overall health ? if pt is motivated  Complete Medication List: 1)  Metoprolol Tartrate 100 Mg Tabs (Metoprolol tartrate) .[name].. 1 by mouth two times a day 2)  Norvasc 5 Mg Tabs (Amlodipine besylate) .... Take one by mouth daily 3)  Cardura 8 Mg Tabs (Doxazosin mesylate) .... Take one by mouth daily 4)  Support Stockings To The Knee 15-20 Mm Hg  .... To wear on legs once daily  as directed for edema 5)  Lisinopril 5 Mg Tabs (Lisinopril) .... Take one by mouth daily 6)  Vitamin B-12 500 Mcg Tabs (Cyanocobalamin) .... Otc as directed. 7)  Lipitor 10 Mg Tabs (Atorvastatin calcium) .[name].. 1 by mouth once daily in evening  Patient Instructions: 1)  if tickle in throat with cough becomes intolerable I will change your lisiinopril -- so call  2)  start low dose of lipitor 10 mg daily in evening 3)  if you have side effects like muscle pain- stop it and call  4)  if you stick with it -- schedule fasting labs in 6 weeks for lipid/ast/alt 272  5)  schedule follow up in 3 lb  6)  you can raise your HDL (good cholesterol) by increasing exercise and eating omega 3 fatty acid supplement like fish oil or flax seed oil over the counter 7)  you can lower LDL (bad cholesterol) by limiting saturated fats in diet like red meat, fried foods, egg yolks, fatty breakfast meats, high fat dairy products and shellfish  8)  also avoid sugars  Prescriptions: LIPITOR 10 MG TABS (ATORVASTATIN CALCIUM) 1 by mouth once daily in evening  #30 x 11   Entered and Authorized by:   Judith Part MD   Signed by:   Judith Part MD on 01/26/2010   Method used:   Electronically to        [facility]. [address] (retail)       [address] [place], [place]  [address]       Ph: [phone]       Fax: [phone]   RxID:   [id number]   Current Allergies (reviewed today): ! ZOCOR ! AUGMENTIN

## 2010-05-21 NOTE — Assessment & Plan Note (Signed)
Summary: CHECK BP/CLE   Vital Signs:  Patient Profile:   77 Years Old Female Weight:      251 pounds Temp:     97.4 degrees F oral Pulse rate:   72 / minute Pulse rhythm:   regular BP sitting:   148 / 80  (left arm) Cuff size:   large  Vitals Entered By: Lowella Petties (July 12, 2007 8:47 AM)                 Chief Complaint:  Check BP, refill meds, and check place on back.  History of Present Illness: has been feeling pretty good overall not sick lately  wt is stable  checks bp at home - high 140s with diastolic 70s- a little high been off one med since sunday- toprol  labs june HDL 41.8, and LDL 138.7- a little high  has watched diet more carefully has cut out sweets and soft drinks  did cut down on bisciuts, and eats 3 eggs per week does fry foods a lot, red meat once a week sugar was 107  not a lot of exercise  she would feel better if she did      Current Allergies: No known allergies   Past Medical History:    Reviewed history from 07/11/2007 and no changes required:       Blood transfusion       Hypertension       Hypothyroidism       Osteoarthritis       hyperlipidemia          Social History:    Reviewed history from 07/11/2007 and no changes required:       Marital Status: Married       Children: 2       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  Eyes      Denies blurring.  CV      Denies chest pain or discomfort and palpitations.  Resp      Denies cough and shortness of breath.  MS      Complains of joint pain and stiffness.      Denies joint redness and joint swelling.  Derm      Denies changes in color of skin and rash.  Neuro      Denies numbness, tingling, and weakness.  Psych      mood is fairly good   Endo      Denies excessive thirst and excessive urination.   Physical Exam  General:     overweight but generally well appearing  Head:  normocephalic, atraumatic, and no abnormalities observed.   Eyes:     vision grossly intact, pupils equal, pupils round, and pupils reactive to light.   Neck:     supple with full rom and no masses or thyromegally, no JVD or carotid bruit  Lungs:     Normal respiratory effort, chest expands symmetrically. Lungs are clear to auscultation, no crackles or wheezes. Heart:     Normal rate and regular rhythm. S1 and S2 normal without gallop, murmur, click, rub or other extra sounds. Msk:     advanced changes of OA Pulses:     R and L carotid,radial,femoral,dorsalis pedis and posterior tibial pulses are full and equal bilaterally Extremities:     No clubbing, cyanosis, edema, or deformity noted with normal full range of motion of all joints.  Neurologic:     sensation intact to light touch, gait normal, and DTRs symmetrical and normal.   Skin:     Intact without suspicious lesions or rashes Cervical Nodes:     No lymphadenopathy noted Psych:     normal affect, talkative and pleasant     Impression & Recommendations:  Problem # 1:  HYPERTENSION (ICD-401.9) Assessment: Deteriorated blood pressure not optimal- but off toprol will refil meds labs 1-2 mo then f/u may need to add tx if not controlled Her updated medication list for this problem includes:    Metoprolol Tartrate 100 Mg Tabs (Metoprolol tartrate) .[name]... 1 by mouth two times a day    Norvasc 5 Mg Tabs (Amlodipine besylate) .[name]... Take one by mouth daily    Cardura 8 Mg Tabs (Doxazosin mesylate) .[name]... Take one by mouth daily  BP today: 148/80  Labs Reviewed: Creat: 0.9 (10/05/2006) Chol: 213 (10/05/2006)   HDL: 41.8 (10/05/2006)   LDL: DEL (10/05/2006)   TG: 182 (10/05/2006)   Problem # 2:  HYPOTHYROIDISM NOS (ICD-244.9) Assessment: Unchanged will need new endo in the fall - pt unsatisfied with Dr Danella Deis Her updated medication list for this problem includes:    Synthroid 75 Mcg Tabs (Levothyroxine sodium) .[name]...  Take one by mouth daily   Problem # 3:  HYPERCHOLESTEROLEMIA, PURE (ICD-272.0) Assessment: Unchanged stressed impt of stopping fried foods and red meat labs 1-2 mo then f/u  Problem # 4:  OBESITY (ICD-278.00) Assessment: Unchanged stressed threat to her health from obesity enc wt loss with diet and exercise  Complete Medication List: 1)  Synthroid 75 Mcg Tabs (Levothyroxine sodium) .... Take one by mouth daily 2)  Metoprolol Tartrate 100 Mg Tabs (Metoprolol tartrate) .[name].. 1 by mouth two times a day 3)  Norvasc 5 Mg Tabs (Amlodipine besylate) .... Take one by mouth daily 4)  Cardura 8 Mg Tabs (Doxazosin mesylate) .... Take one by mouth daily   Patient Instructions: 1)  It is important that you exercise regularly at least 20 minutes 5 times a week. If you develop chest pain, have severe difficulty breathing, or feel very tired , stop exercising immediately and seek medical attention. 2)  you can raise your HDL (good cholesterol) by increasing exercise and eating omega 3 fatty acid supplement like fish oil or flax seed oil over the counter 3)  you can lower LDL (bad cholesterol) by limiting saturated fats in diet like red meat, fried foods, egg yolks, fatty breakfast meats, high fat dairy products and shellfish 4)  schedule fasting labs in 1-2 months lipid prof/ast/alt 272, renal, cbc with diff 401.1 5)  then f/u 2 weeks later     Prescriptions: CARDURA 8 MG  TABS (DOXAZOSIN MESYLATE) take one by mouth daily  #90 x 3   Entered and Authorized by:   Judith Part MD   Signed by:   Judith Part MD on 07/12/2007   Method used:   Print then Give to Patient   RxID:   [id number] NORVASC 5 MG  TABS (AMLODIPINE BESYLATE) take one by mouth daily  #90 x 3   Entered and Authorized by:   Judith Part MD   Signed by:   Judith Part MD on 07/12/2007   Method used:   Print then Give to Patient   RxID:   [id number] METOPROLOL TARTRATE 100 MG  TABS (METOPROLOL TARTRATE) 1 by  mouth two times a day  #180 x 3   Entered and Authorized by:  Judith Part MD   Signed by:   Judith Part MD on 07/12/2007   Method used:   Print then Give to Patient   RxID:   [id number] CARDURA 8 MG  TABS (DOXAZOSIN MESYLATE) take one by mouth daily  #30 x 11   Entered and Authorized by:   Judith Part MD   Signed by:   Judith Part MD on 07/12/2007   Method used:   Print then Give to Patient   RxID:   [id number] NORVASC 5 MG  TABS (AMLODIPINE BESYLATE) take one by mouth daily  #30 x 11   Entered and Authorized by:   Judith Part MD   Signed by:   Judith Part MD on 07/12/2007   Method used:   Print then Give to Patient   RxID:   [id number] METOPROLOL TARTRATE 100 MG  TABS (METOPROLOL TARTRATE) 1 by mouth two times a day  #60 x 11   Entered and Authorized by:   Judith Part MD   Signed by:   Judith Part MD on 07/12/2007   Method used:   Print then Give to Patient   RxID:   [id number]  ] Prior Medications: SYNTHROID 75 MCG  TABS (LEVOTHYROXINE SODIUM) take one by mouth daily METOPROLOL TARTRATE 100 MG  TABS (METOPROLOL TARTRATE) 1 by mouth two times a day NORVASC 5 MG  TABS (AMLODIPINE BESYLATE) take one by mouth daily CARDURA 8 MG  TABS (DOXAZOSIN MESYLATE) take one by mouth daily Current Allergies: No known allergies

## 2010-05-21 NOTE — Assessment & Plan Note (Signed)
Summary: ONE MONTH FOLLOW UP   Vital Signs:  Patient profile:   77 year old female Height:      63 inches Weight:      254.50 pounds BMI:     45.25 Temp:     97.6 degrees F oral Pulse rate:   60 / minute Pulse rhythm:   regular BP sitting:   126 / 74  (left arm) Cuff size:   large  Vitals Entered By: Lewanda Rife LPN (May 27, 2009 8:24 AM)  History of Present Illness: here for f/u of HTN an dDM and lipids   wt is down 3 lb from last visit  has not been snacking-- is working on it a bit  no exercise -- too cold  is going up and down stairs - otherwise out of habit  is open to doing exercise videos   bp is good 126/74 today lisinopril was added for renal prot after high microalb at last visit -- no problems or side effects  sugar well controlled with AIC of 5.8  lipids still a bit too high with trig 162/ HDL 46 and LDL 113-- this is overall stable  thyroid is ok- with stable - tx tsh  feels like her thyroid is ok   does not get flu shots or pneumovax      Allergies: 1)  ! Zocor 2)  ! Augmentin  Past History:  Past Surgical History: Last updated: 01/02/2009 Cholecystectomy Tubal ligation Total right knee arthroplasty (05/23/1998) Total knee replacement (07/1998) Polyp removal Dexa- normal (09/1999) Rib fracture Carotid dopplers- normal (04/2004) Wrist fracture (2007) Kidney stones (2007) hand fx with surgery after a fall 9/10  Family History: Last updated: 2007-08-06 Father: died from MI Mother: died age 40 Siblings: 6 brothers- 1 died from cancer, 1 died from leukemia, 1 died from stomach cancer, 1 had CABG, 2 ok.  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 Eyes:  Denies blurring and eye irritation. CV:  Denies chest pain or discomfort and palpitations. Resp:  Denies cough and wheezing. GI:  Denies abdominal pain, change in bowel habits, and nausea. GU:  Denies urinary frequency. MS:  Denies muscle aches. Derm:  Denies lesion(s), poor wound healing, and rash. Neuro:  Denies headaches, numbness, tingling, and tremors. Psych:  Denies anxiety and depression. Endo:  Denies excessive thirst and excessive urination.  Physical Exam  General:  overweight but generally well appearing  Head:  normocephalic, atraumatic, and no abnormalities observed.   Eyes:  vision grossly intact, pupils equal, pupils round, and pupils reactive to light.   Mouth:  pharynx pink and moist.   Neck:  supple with full rom and no masses or thyromegally, no JVD or carotid bruit  Lungs:  Normal respiratory effort, chest expands symmetrically. Lungs are clear to auscultation, no crackles or wheezes. Heart:  Normal rate and regular rhythm. S1 and S2 normal without gallop, murmur, click, rub or other extra sounds. Msk:  No deformity or scoliosis noted of thoracic or lumbar spine.   Pulses:  R and L carotid,radial,femoral,dorsalis pedis and posterior tibial pulses are full and equal bilaterally Extremities:  trace edema with supp hose Neurologic:  sensation intact to light touch, gait normal, and DTRs symmetrical and normal.   Skin:  Intact without suspicious lesions or rashes Cervical Nodes:  No lymphadenopathy noted Psych:  normal affect, talkative and pleasant   Diabetes Management Exam:    Foot Exam (with socks and/or shoes not present):       Sensory-Pinprick/Light touch:          Left medial foot (L-4): normal          Left dorsal foot (L-5): normal          Left lateral foot (S-1): normal          Right medial  foot (L-4): normal          Right dorsal foot (L-5): normal          Right lateral foot (S-1): normal       Sensory-Monofilament:          Left foot: normal          Right foot: normal       Inspection:          Left foot: normal          Right foot: normal       Nails:          Left foot: normal          Right foot: normal   Impression & Recommendations:  Problem # 1:  HYPERTENSION (ICD-401.9) Assessment Unchanged  bp is well controlled and tolerating addn of ace  enc to keep working on diet and exercise and wt loss f/u july with labs too  rev labs today recommend 20 min exercise per day Her updated medication list for this problem includes:    Metoprolol Tartrate 100 Mg Tabs (Metoprolol tartrate) .[name]... 1 by mouth two times a day    Norvasc 5 Mg Tabs (Amlodipine besylate) .[name]... Take one by mouth daily    Cardura 8 Mg Tabs (Doxazosin mesylate) .[name]... Take one by mouth daily    Lisinopril 5 Mg Tabs (Lisinopril) .[name]... Take one by mouth daily  BP today: 126/74 Prior BP: 122/84 (04/23/2009)  Labs Reviewed: K+: 4.5 (04/23/2009) Creat: : 1.0 (04/23/2009)   Chol: 192 (04/23/2009)   HDL: 46.70 (04/23/2009)   LDL: 113 (04/23/2009)   TG: 162.0 (04/23/2009)  Problem # 2:  HYPOTHYROIDISM NOS (ICD-244.9) Assessment: Unchanged  stable with no clinical changes and stable tsh  rev labs   Labs Reviewed: TSH: 4.18 (04/23/2009)    HgBA1c: 5.8 (04/23/2009) Chol: 192 (04/23/2009)   HDL: 46.70 (04/23/2009)   LDL: 113 (04/23/2009)   TG: 162.0 (04/23/2009)  Problem # 3:  HYPERCHOLESTEROLEMIA, PURE (ICD-272.0) Assessment: Unchanged  overall about the same - fairly good control ultimately would like LDL under 70 rev low sat fat diet  lab and f/u in july  Labs Reviewed: SGOT: 19 (04/23/2009)   SGPT: 16 (04/23/2009)   HDL:46.70 (04/23/2009), 46.6 (06/26/2008)  LDL:113 (04/23/2009), 117 (06/26/2008)  Chol:192 (04/23/2009), 188 (06/26/2008)  Trig:162.0 (04/23/2009), 123  (06/26/2008)  Problem # 4:  DIABETES MELLITUS, TYPE II (ICD-250.00) very well controlled with diet adv to start exercise tolerating ace lab and f/u july  up to date opthy and good foot care  pt declines flu and pneumovax- no good reason - but she does not want to be hassled about it  will re check microalb at that time - hope for imp with ace Her updated medication list for this problem  includes:    Lisinopril 5 Mg Tabs (Lisinopril) .[name]... Take one by mouth daily  Complete Medication List: 1)  Metoprolol Tartrate 100 Mg Tabs (Metoprolol tartrate) .[name].. 1 by mouth two times a day 2)  Norvasc 5 Mg Tabs (Amlodipine besylate) .... Take one by mouth daily 3)  Cardura 8 Mg Tabs (Doxazosin mesylate) .... Take one by mouth daily 4)  Support Stockings To The Knee 15-20 Mm Hg  .... To wear on legs once daily as directed for edema 5)  Lisinopril 5 Mg Tabs (Lisinopril) .... Take one by mouth daily 6)  Vitamin B-12 500 Mcg Tabs (Cyanocobalamin) .... Otc as directed.  Patient Instructions: 1)  continue your current medicines without change 2)  It is important that you exercise reguarly at least 20 minutes 5 times a week. If you develop chest pain, have severe difficulty breathing, or feel very tired, stop exercising immediately and seek medical attention.  3)  keep working on low cholesterol and sugar diet- also for weight loss 4)  keep your appt for july (schedule fasting labs one week before with lipid/ast/alt/AIC/ microalb/ tsh / renal 401.1, 272, 250.0, 244.9 Prescriptions: LISINOPRIL 5 MG TABS (LISINOPRIL) take one by mouth daily  #90 x 3   Entered and Authorized by:   Judith Part MD   Signed by:   Judith Part MD on 05/27/2009   Method used:   Print then Give to Patient   RxID:   [id number] CARDURA 8 MG  TABS (DOXAZOSIN MESYLATE) take one by mouth daily  #90 x 3   Entered and Authorized by:   Judith Part MD   Signed by:   Judith Part MD on 05/27/2009   Method used:    Print then Give to Patient   RxID:   [id number] NORVASC 5 MG  TABS (AMLODIPINE BESYLATE) take one by mouth daily  #90 x 3   Entered and Authorized by:   Judith Part MD   Signed by:   Judith Part MD on 05/27/2009   Method used:   Print then Give to Patient   RxID:   [id number] METOPROLOL TARTRATE 100 MG  TABS (METOPROLOL TARTRATE) 1 by mouth two times a day  #180 x 3   Entered and Authorized by:   Judith Part MD   Signed by:   Judith Part MD on 05/27/2009   Method used:   Print then Give to Patient   RxID:   [id number]   Current Allergies (reviewed today): ! ZOCOR ! AUGMENTIN     Preventive Care Screening  Contraindications of Treatment or Deferment of Test/Procedure:    Test/Procedure: FLU VAX    Reason for deferment: patient declined     Test/Procedure: Pneumovax vaccine    Reason for deferment: patient declined

## 2010-05-21 NOTE — Consult Note (Signed)
Summary: Dr. Horris Latino  Dr. Horris Latino   Imported By: Beau Fanny 03/17/2007 10:19:22  _____________________________________________________________________  External Attachment:    Type:   Image     Comment:   External Document

## 2010-06-16 LAB — HM MAMMOGRAPHY: HM Mammogram: NORMAL

## 2010-09-27 ENCOUNTER — Encounter: Payer: Self-pay | Admitting: Family Medicine

## 2010-10-05 ENCOUNTER — Ambulatory Visit (INDEPENDENT_AMBULATORY_CARE_PROVIDER_SITE_OTHER): Payer: Medicare Other | Admitting: Family Medicine

## 2010-10-05 ENCOUNTER — Telehealth: Payer: Self-pay | Admitting: Family Medicine

## 2010-10-05 ENCOUNTER — Encounter: Payer: Self-pay | Admitting: Family Medicine

## 2010-10-05 DIAGNOSIS — I10 Essential (primary) hypertension: Secondary | ICD-10-CM

## 2010-10-05 DIAGNOSIS — E039 Hypothyroidism, unspecified: Secondary | ICD-10-CM

## 2010-10-05 DIAGNOSIS — E669 Obesity, unspecified: Secondary | ICD-10-CM

## 2010-10-05 DIAGNOSIS — E7800 Pure hypercholesterolemia, unspecified: Secondary | ICD-10-CM

## 2010-10-05 DIAGNOSIS — E119 Type 2 diabetes mellitus without complications: Secondary | ICD-10-CM

## 2010-10-05 LAB — CBC WITH DIFFERENTIAL/PLATELET
Basophils Absolute: 0 10*3/uL (ref 0.0–0.1)
Basophils Relative: 0.4 % (ref 0.0–3.0)
Eosinophils Absolute: 0.2 10*3/uL (ref 0.0–0.7)
Eosinophils Relative: 2.6 % (ref 0.0–5.0)
HCT: 36.8 % (ref 36.0–46.0)
Hemoglobin: 12.6 g/dL (ref 12.0–15.0)
Lymphocytes Relative: 31.6 % (ref 12.0–46.0)
Lymphs Abs: 2.7 10*3/uL (ref 0.7–4.0)
MCHC: 34.1 g/dL (ref 30.0–36.0)
MCV: 88.8 fl (ref 78.0–100.0)
Monocytes Absolute: 0.8 10*3/uL (ref 0.1–1.0)
Monocytes Relative: 9.4 % (ref 3.0–12.0)
Neutro Abs: 4.7 10*3/uL (ref 1.4–7.7)
Neutrophils Relative %: 56 % (ref 43.0–77.0)
Platelets: 193 10*3/uL (ref 150.0–400.0)
RBC: 4.14 Mil/uL (ref 3.87–5.11)
RDW: 15.3 % — ABNORMAL HIGH (ref 11.5–14.6)
WBC: 8.4 10*3/uL (ref 4.5–10.5)

## 2010-10-05 LAB — COMPREHENSIVE METABOLIC PANEL
ALT: 15 U/L (ref 0–35)
AST: 18 U/L (ref 0–37)
Albumin: 3.8 g/dL (ref 3.5–5.2)
Alkaline Phosphatase: 106 U/L (ref 39–117)
BUN: 17 mg/dL (ref 6–23)
CO2: 25 mEq/L (ref 19–32)
Calcium: 8.8 mg/dL (ref 8.4–10.5)
Chloride: 100 mEq/L (ref 96–112)
Creatinine, Ser: 1 mg/dL (ref 0.4–1.2)
GFR: 55.35 mL/min — ABNORMAL LOW (ref >60.00)
Glucose, Bld: 114 mg/dL — ABNORMAL HIGH (ref 70–99)
Potassium: 4.2 mEq/L (ref 3.5–5.1)
Sodium: 139 mEq/L (ref 135–145)
Total Bilirubin: 0.6 mg/dL (ref 0.3–1.2)
Total Protein: 7.4 g/dL (ref 6.0–8.3)

## 2010-10-05 LAB — HEMOGLOBIN A1C: Hgb A1c MFr Bld: 6.5 % (ref 4.6–6.5)

## 2010-10-05 LAB — LIPID PANEL
Cholesterol: 206 mg/dL — ABNORMAL HIGH (ref 0–200)
HDL: 52.4 mg/dL (ref >39.00)
Total CHOL/HDL Ratio: 4
Triglycerides: 116 mg/dL (ref 0.0–149.0)
VLDL: 23.2 mg/dL (ref 0.0–40.0)

## 2010-10-05 LAB — LDL CHOLESTEROL, DIRECT: Direct LDL: 139.5 mg/dL

## 2010-10-05 LAB — TSH: TSH: 2.81 u[IU]/mL (ref 0.35–5.50)

## 2010-10-05 NOTE — Assessment & Plan Note (Signed)
Very well controlled without change Lab today F/u 6 mo

## 2010-10-05 NOTE — Assessment & Plan Note (Addendum)
Again adv to loose wt for multiple health problems Needs to prioritize exercise

## 2010-10-05 NOTE — Assessment & Plan Note (Signed)
No clinical changes  tsh today and adv  

## 2010-10-05 NOTE — Patient Instructions (Signed)
Labs today  Avoid red meat/ fried foods/ egg yolks/ fatty breakfast meats/ butter, cheese and high fat dairy/ and shellfish  Keep watching starch and sugar in diet and work on weight loss Any amount of exercise is good - exercise bike is a good idea (you have to make time for it )  Make sure to schedule eye exam  Follow up with me in 6 months for a 30 minute check up  (physical) with labs prior

## 2010-10-05 NOTE — Progress Notes (Signed)
Subjective:    Patient ID: Valerie Ramirez, female    DOB: 1932-05-01, 77 y.o.   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 and it always bothers her  Shoulders also hurt  No ortho now - not ready for that yet    Last visit chol ok with lipitor Lab Results  Component Value Date   CHOL 162 03/17/2010   CHOL 207* 01/21/2010   CHOL 207* 10/17/2009   Lab Results  Component Value Date   HDL 50.00 03/17/2010   HDL 51.30 01/21/2010   HDL 91.47 10/17/2009   Lab Results  Component Value Date   LDLCALC 87 03/17/2010   LDLCALC 113* 04/23/2009   LDLCALC 117* 06/26/2008   Lab Results  Component Value Date   TRIG 127.0 03/17/2010   TRIG 121.0 01/21/2010   TRIG 134.0 10/17/2009   Lab Results  Component Value Date   CHOLHDL 3 03/17/2010   CHOLHDL 4 01/21/2010   CHOLHDL 4 10/17/2009   Lab Results  Component Value Date   LDLDIRECT 143.9 01/21/2010   LDLDIRECT 141.8 10/17/2009   LDLDIRECT 138.7 10/05/2006  is watching diet for fats  Eats some fried foods occas  Had to STOP CHOL MED LIPITOR  due to leg and body pain     Wt is down a few pounds   Exercise- cannot do a lot of walking due to chronic pain  Works in the garden  Her son has a pool - but she does not know how to swim      Sugar control Lab Results  Component Value Date   HGBA1C 6.2 01/21/2010   good control with diet alone  Thinks she is pretty good with her sugar and starches    HTN in good control 118/68 No ha or edema or palpitations or cp   Thyroid due for check Lab Results  Component Value Date   TSH 3.78 10/17/2009  feels like it is about the same on this dose - no change in hair or skin or energy level  Patient Active Problem List  Diagnoses  . GOITER, MULTINODULAR  . HYPOTHYROIDISM NOS  . DIABETES MELLITUS, TYPE II  . HYPERCHOLESTEROLEMIA, PURE  . HYPERTENSION  .  CARDIOMYOPATHY  . OSTEOARTHRITIS  . NECK PAIN  . LEG EDEMA  . HEADACHE  . MIXED INCONTINENCE URGE AND STRESS  . LIVER FUNCTION TESTS, ABNORMAL  . OBESITY   Past Medical History  Diagnosis Date  . Hypertension   . Hypothyroid   . Arthritis     OA  . Hyperlipidemia   . Diabetes mellitus     type II   Past Surgical History  Procedure Date  . Cholecystectomy   . Tubal ligation   . Joint replacement 07/1998  . Knee arthroplasty 05/23/1998    total right  . Polyp removal   . Hand surgery 12/2008    after hand fx/due to fall   History  Substance Use Topics  . Smoking status: Never Smoker   . Smokeless tobacco: Never Used  . Alcohol Use: No   Family History  Problem Relation Age of Onset  . Heart disease Sister     CABG  . Stroke Sister   . Heart disease Brother     CABG   Allergies  Allergen Reactions  . WGN:FAOZHYQMVHQ+IONGEXBMW+[id number] Acid+Aspartame  REACTION: stomach upset  . Simvastatin     REACTION: muscle pain   Current Outpatient Prescriptions on File Prior to Visit  Medication Sig Dispense Refill  . amLODipine (NORVASC) 5 MG tablet Take 5 mg by mouth daily.        [name] aspirin 325 MG tablet Take 325-650 mg by mouth 3 (three) times daily as needed. For pain.       . cyanocobalamin 500 MCG tablet Take by mouth as directed.        . doxazosin (CARDURA) 8 MG tablet Take 8 mg by mouth daily.        [name] lisinopril (PRINIVIL,ZESTRIL) 5 MG tablet Take 5 mg by mouth daily.        . metoprolol (LOPRESSOR) 100 MG tablet Take 100 mg by mouth 2 (two) times daily.        [name] atorvastatin (LIPITOR) 10 MG tablet Take 10 mg by mouth every evening.             Review of Systems Review of Systems  Constitutional: Negative for fever, appetite change,  and unexpected weight change.pos for fatigue   Eyes: Negative for pain and visual disturbance.  Respiratory: Negative for cough and shortness of breath.   Cardiovascular: Negative. For palpitations or edema     Gastrointestinal: Negative for nausea, diarrhea and constipation.  Genitourinary: Negative for urgency and frequency.  Skin: Negative for pallor or rash .  Neurological: Negative for weakness, light-headedness, numbness and headaches.  Hematological: Negative for adenopathy. Does not bruise/bleed easily.  Psychiatric/Behavioral: Negative for dysphoric mood. The patient is not nervous/anxious.          Objective:   Physical Exam  Constitutional: She appears well-developed and well-nourished. No distress.       Obese and well appearing   HENT:  Head: Normocephalic and atraumatic.  Mouth/Throat: Oropharynx is clear and moist.  Eyes: Conjunctivae and EOM are normal. Pupils are equal, round, and reactive to light.  Neck: Normal range of motion. Neck supple. No JVD present. Carotid bruit is not present. No thyromegaly present.  Cardiovascular: Normal rate, regular rhythm, normal heart sounds and intact distal pulses.   Pulmonary/Chest: Effort normal and breath sounds normal. No respiratory distress. She has no wheezes. She has no rales.  Abdominal: Soft. Bowel sounds are normal. She exhibits no distension, no abdominal bruit and no mass. There is no tenderness.  Musculoskeletal: She exhibits no edema and no tenderness.  Lymphadenopathy:    She has no cervical adenopathy.  Neurological: She is alert. She has normal reflexes. No cranial nerve deficit. Coordination normal.  Skin: Skin is warm and dry. No rash noted. No erythema. No pallor.  Psychiatric: She has a normal mood and affect.       Nl affect but pt seems mildly disappointed / perhaps bitter about being here or her life situation           Assessment & Plan:

## 2010-10-05 NOTE — Assessment & Plan Note (Signed)
Intol of lipitor and off of it now  Will check labs today for baseline  Enc to be better with diet  Rev low sat fat diet

## 2010-10-05 NOTE — Telephone Encounter (Signed)
Please let pt know that labs show increase in sugar to diabetic range mild - so please work on wt loss and avoid sweets/limit starches Chol fairly stable -- please diet carefully to get it lower Other labs fairly stable  No changes  F/u 6 mo as planned   Also- I noted that while these labs were in her chart - they were never sent to my in basket - could you please find out why? -thanks

## 2010-10-05 NOTE — Assessment & Plan Note (Signed)
Hyperglycemia with last a1c of 6.2 Obesity is a factor- pt not very motivated to change however  Rev low glycemic diet  Pt due for opthy- will sched that herself

## 2010-10-06 NOTE — Telephone Encounter (Signed)
Patient notified as instructed by telephone.      Lab staff is investigating why labs were not routed to in basket.

## 2011-03-21 ENCOUNTER — Telehealth: Payer: Self-pay | Admitting: Family Medicine

## 2011-03-21 DIAGNOSIS — E119 Type 2 diabetes mellitus without complications: Secondary | ICD-10-CM

## 2011-03-21 DIAGNOSIS — E039 Hypothyroidism, unspecified: Secondary | ICD-10-CM

## 2011-03-21 DIAGNOSIS — E7800 Pure hypercholesterolemia, unspecified: Secondary | ICD-10-CM

## 2011-03-21 DIAGNOSIS — I10 Essential (primary) hypertension: Secondary | ICD-10-CM

## 2011-03-21 DIAGNOSIS — E042 Nontoxic multinodular goiter: Secondary | ICD-10-CM

## 2011-03-21 DIAGNOSIS — R945 Abnormal results of liver function studies: Secondary | ICD-10-CM

## 2011-03-21 NOTE — Telephone Encounter (Signed)
Message copied by Judy Pimple on Sun Mar 21, 2011  5:48 PM ------      Message from: Alvina Chou      Created: Wed Mar 17, 2011  3:59 PM      Regarding: orders, 12.5       Patient is scheduled for CPX labs, please order future labs, Thanks , Camelia Eng

## 2011-03-24 ENCOUNTER — Other Ambulatory Visit (INDEPENDENT_AMBULATORY_CARE_PROVIDER_SITE_OTHER): Payer: Medicare Other

## 2011-03-24 DIAGNOSIS — E119 Type 2 diabetes mellitus without complications: Secondary | ICD-10-CM

## 2011-03-24 DIAGNOSIS — E039 Hypothyroidism, unspecified: Secondary | ICD-10-CM

## 2011-03-24 DIAGNOSIS — R945 Abnormal results of liver function studies: Secondary | ICD-10-CM

## 2011-03-24 DIAGNOSIS — E042 Nontoxic multinodular goiter: Secondary | ICD-10-CM

## 2011-03-24 DIAGNOSIS — I10 Essential (primary) hypertension: Secondary | ICD-10-CM

## 2011-03-24 DIAGNOSIS — E7800 Pure hypercholesterolemia, unspecified: Secondary | ICD-10-CM

## 2011-03-24 LAB — COMPREHENSIVE METABOLIC PANEL
ALT: 17 U/L (ref 0–35)
AST: 19 U/L (ref 0–37)
Albumin: 3.7 g/dL (ref 3.5–5.2)
Alkaline Phosphatase: 94 U/L (ref 39–117)
BUN: 17 mg/dL (ref 6–23)
CO2: 27 mEq/L (ref 19–32)
Calcium: 8.9 mg/dL (ref 8.4–10.5)
Chloride: 106 mEq/L (ref 96–112)
Creatinine, Ser: 1 mg/dL (ref 0.4–1.2)
GFR: 58.55 mL/min — ABNORMAL LOW (ref >60.00)
Glucose, Bld: 110 mg/dL — ABNORMAL HIGH (ref 70–99)
Potassium: 4.2 mEq/L (ref 3.5–5.1)
Sodium: 141 mEq/L (ref 135–145)
Total Bilirubin: 0.7 mg/dL (ref 0.3–1.2)
Total Protein: 7.1 g/dL (ref 6.0–8.3)

## 2011-03-24 LAB — TSH: TSH: 3.31 u[IU]/mL (ref 0.35–5.50)

## 2011-03-24 LAB — CBC WITH DIFFERENTIAL/PLATELET
Basophils Absolute: 0 10*3/uL (ref 0.0–0.1)
Basophils Relative: 0.4 % (ref 0.0–3.0)
Eosinophils Absolute: 0.3 10*3/uL (ref 0.0–0.7)
Eosinophils Relative: 2.9 % (ref 0.0–5.0)
HCT: 36.2 % (ref 36.0–46.0)
Hemoglobin: 12.1 g/dL (ref 12.0–15.0)
Lymphocytes Relative: 31.3 % (ref 12.0–46.0)
Lymphs Abs: 2.9 10*3/uL (ref 0.7–4.0)
MCHC: 33.5 g/dL (ref 30.0–36.0)
MCV: 90.2 fl (ref 78.0–100.0)
Monocytes Absolute: 0.8 10*3/uL (ref 0.1–1.0)
Monocytes Relative: 8.8 % (ref 3.0–12.0)
Neutro Abs: 5.3 10*3/uL (ref 1.4–7.7)
Neutrophils Relative %: 56.6 % (ref 43.0–77.0)
Platelets: 163 10*3/uL (ref 150.0–400.0)
RBC: 4.02 Mil/uL (ref 3.87–5.11)
RDW: 15.5 % — ABNORMAL HIGH (ref 11.5–14.6)
WBC: 9.3 10*3/uL (ref 4.5–10.5)

## 2011-03-24 LAB — LIPID PANEL
Cholesterol: 194 mg/dL (ref 0–200)
HDL: 48.6 mg/dL (ref >39.00)
LDL Cholesterol: 124 mg/dL — ABNORMAL HIGH (ref 0–99)
Total CHOL/HDL Ratio: 4
Triglycerides: 108 mg/dL (ref 0.0–149.0)
VLDL: 21.6 mg/dL (ref 0.0–40.0)

## 2011-03-24 LAB — HEMOGLOBIN A1C: Hgb A1c MFr Bld: 6 % (ref 4.6–6.5)

## 2011-03-31 ENCOUNTER — Ambulatory Visit (INDEPENDENT_AMBULATORY_CARE_PROVIDER_SITE_OTHER)
Admission: RE | Admit: 2011-03-31 | Discharge: 2011-03-31 | Disposition: A | Discharge status: Home / Self Care | Payer: Medicare Other | Source: Ambulatory Visit | Attending: Family Medicine | Admitting: Family Medicine

## 2011-03-31 ENCOUNTER — Ambulatory Visit (INDEPENDENT_AMBULATORY_CARE_PROVIDER_SITE_OTHER): Payer: Medicare Other | Admitting: Family Medicine

## 2011-03-31 ENCOUNTER — Encounter: Payer: Self-pay | Admitting: Family Medicine

## 2011-03-31 VITALS — BP 128/72 | HR 60 | Temp 97.4°F | Ht 63.0 in | Wt 255.0 lb

## 2011-03-31 DIAGNOSIS — M542 Cervicalgia: Secondary | ICD-10-CM

## 2011-03-31 DIAGNOSIS — M546 Pain in thoracic spine: Secondary | ICD-10-CM

## 2011-03-31 DIAGNOSIS — R079 Chest pain, unspecified: Secondary | ICD-10-CM

## 2011-03-31 DIAGNOSIS — Z1231 Encounter for screening mammogram for malignant neoplasm of breast: Secondary | ICD-10-CM

## 2011-03-31 DIAGNOSIS — E7800 Pure hypercholesterolemia, unspecified: Secondary | ICD-10-CM

## 2011-03-31 DIAGNOSIS — I10 Essential (primary) hypertension: Secondary | ICD-10-CM

## 2011-03-31 DIAGNOSIS — E119 Type 2 diabetes mellitus without complications: Secondary | ICD-10-CM

## 2011-03-31 DIAGNOSIS — E039 Hypothyroidism, unspecified: Secondary | ICD-10-CM

## 2011-03-31 NOTE — Progress Notes (Signed)
Subjective:    Patient ID: Valerie Ramirez, female    DOB: 03/11/1933, 77 y.o.   MRN: [id number]  HPI Here for check up of chronic medical conditions and to review health mt list   Has been feeling pretty good except pain in back and shoulders and upper back   Is relatively new  Worse for past few weeks  All the way across  Takes asa for it - helps some  Is constant- all the time  She does have degenerative disk dz in neck  Not exertional at all  Does not think it is her heart - she is sure of this    Wt is down 3 lb with bmi of 45  bp is   128/72  Today Well controlled  No change in meds  No cp or palpitations or headaches or edema  No side effects to medicines    Hypothyroid Lab Results  Component Value Date   TSH 3.31 03/24/2011   clinically- no symptoms , no problems   DM2 a1c better down from 6.5 to 6.0 Diet controlled-- no meds at all - has cut out a lot of junk and does not eat as much  Exercise - 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 started - was doing harvesting  opthy-was march- ok , no problems  Foot care On ace   Lipids - intol of statins Was on lipitor-- all bothered her muscles  Lab Results  Component Value Date   CHOL 194 03/24/2011   CHOL 206* 10/05/2010   CHOL 162 03/17/2010   Lab Results  Component Value Date   HDL 48.60 03/24/2011   HDL 40.98 10/05/2010   HDL 11.91 03/17/2010   Lab Results  Component Value Date   LDLCALC 124* 03/24/2011   LDLCALC 87 03/17/2010   LDLCALC 113* 04/23/2009   Lab Results  Component Value Date   TRIG 108.0 03/24/2011   TRIG 116.0 10/05/2010   TRIG 127.0 03/17/2010   Lab Results  Component Value Date   CHOLHDL 4 03/24/2011   CHOLHDL 4 10/05/2010   CHOLHDL 3 03/17/2010   Lab Results  Component Value Date   LDLDIRECT 139.5 10/05/2010   LDLDIRECT 143.9 01/21/2010   LDLDIRECT 141.8 10/17/2009     Zoster status ? Interested   Pneumovax Flu- has never taken one , would rather get at [facility] Tdap- would have to get at health  dept since not covered by medicare   Has not had a mammogram in years  ? Whether she would tx cancer if she had it  Has had breast masses B9 in past  Will get it at [facility]   No gyn problems No new partners or abn paps in past Does not want an exam at her age       Review of SystemsReview of Systems  Constitutional: Negative for fever, appetite change, fatigue and unexpected weight change.  Eyes: Negative for pain and visual disturbance.  Respiratory: Negative for cough and shortness of breath.   Cardiovascular: Negative for  palpitations or sob    Gastrointestinal: Negative for nausea, diarrhea and constipation.  Genitourinary: Negative for urgency and frequency.  Skin: Negative for pallor or rash   MSK pos for neck and upper back pain rad to chest  Neurological: Negative for weakness, light-headedness, numbness and headaches.  Hematological: Negative for adenopathy. Does not bruise/bleed easily.  Psychiatric/Behavioral: Negative for dysphoric mood. The patient is not nervous/anxious.          Objective:  Physical Exam  Constitutional: She appears well-developed and well-nourished. No distress.       Obese and well appearing   HENT:  Head: Normocephalic and atraumatic.  Right Ear: External ear normal.  Left Ear: External ear normal.  Nose: Nose normal.  Mouth/Throat: Oropharynx is clear and moist. No oropharyngeal exudate.  Eyes: Conjunctivae and EOM are normal. Pupils are equal, round, and reactive to light. No scleral icterus.  Neck: Normal range of motion. Neck supple. No JVD present. Carotid bruit is not present. Thyromegaly present.  Cardiovascular: Normal rate, regular rhythm, normal heart sounds and intact distal pulses.  Exam reveals no gallop.   Pulmonary/Chest: Effort normal and breath sounds normal. No respiratory distress. She has no wheezes.       No crackles   Abdominal: Soft. Normal appearance and bowel sounds are normal. She exhibits no  distension, no abdominal bruit and no mass. There is no tenderness.  Genitourinary: No breast swelling, tenderness, discharge or bleeding.       Breast exam: No mass, nodules, thickening, tenderness, bulging, retraction, inflamation, nipple discharge or skin changes noted.  No axillary or clavicular LA.  Chaperoned exam.    Musculoskeletal: Normal range of motion. She exhibits tenderness. She exhibits no edema.       Some tenderness over lower cervical vertebrae and also trapezius muscles/ pectoral muscles (chest wall)  Nl rom neck with pain on full ext  Nl rom arms  Nl shoulder rom  Nl twist   Lymphadenopathy:    She has no cervical adenopathy.  Neurological: She is alert. She has normal strength and normal reflexes. She displays no atrophy and no tremor. No cranial nerve deficit or sensory deficit. She exhibits normal muscle tone. Coordination and gait normal.  Skin: Skin is warm and dry. No rash noted. No erythema. No pallor.  Psychiatric: She has a normal mood and affect.       Mildly irritable today          Assessment & Plan:

## 2011-03-31 NOTE — Patient Instructions (Addendum)
It is extremely important you get a flu shot - go to [facility] and get one asap  Also in future - strongly recommend pneumonia vaccine (could do that here) Also you are due to Tdap (tetnus shot )- you can get that at the health dept  If you are interested in shingles vaccine in future - call your insurance company to see how coverage is and call us to schedule   Avoid red meat/ fried foods/ egg yolks/ fatty breakfast meats/ butter, cheese and high fat dairy/ and shellfish  -- this is important for cholesterol since you cannot take medicines  We will schedule mammogram at check out  xrays today and will update you  Follow up with me after the holidays  Use warm compress if needed and if symptoms worsen let me know

## 2011-04-01 ENCOUNTER — Other Ambulatory Visit: Payer: Self-pay | Admitting: Family Medicine

## 2011-04-01 NOTE — Assessment & Plan Note (Signed)
Lab Results  Component Value Date   HGBA1C 6.0 03/24/2011   overall - not in DM range- reassuring Rev need for wt loss and low glycemic diet

## 2011-04-01 NOTE — Assessment & Plan Note (Signed)
Known disc dz Xray today  Seems to be rad to back and chest  Update after films

## 2011-04-01 NOTE — Assessment & Plan Note (Signed)
Disc goals for lipids and reasons to control them Rev labs with pt Rev low sat fat diet in detail   

## 2011-04-01 NOTE — Assessment & Plan Note (Signed)
With hx of goiter unchanged Lab Results  Component Value Date   TSH 3.31 03/24/2011    Stable  No new clinical symptoms

## 2011-04-01 NOTE — Assessment & Plan Note (Signed)
?   Related to chest pain and soreness and neck pain Xray today and update

## 2011-04-01 NOTE — Assessment & Plan Note (Signed)
Scheduled annual screening mammogram Nl breast exam today  Encouraged monthly self exams   

## 2011-04-01 NOTE — Assessment & Plan Note (Signed)
bp in fair control at this time  No changes needed  Disc lifstyle change with low sodium diet and exercise   

## 2011-04-01 NOTE — Assessment & Plan Note (Signed)
With some tenderness Seems to be musculoskeletal and related to back and neck pain /positional  xr of chest and spine today and update Recommend heat and stretches for now

## 2011-04-15 ENCOUNTER — Other Ambulatory Visit: Payer: Self-pay | Admitting: Family Medicine

## 2011-04-16 NOTE — Telephone Encounter (Signed)
[facility] request refill Cardura 8 mg #90 x 3,Lisinopril 5 mg #90 x 3, and Amlodipine 5 mg #90 x 3.Pt last seen 03/31/11.

## 2011-05-03 ENCOUNTER — Ambulatory Visit (INDEPENDENT_AMBULATORY_CARE_PROVIDER_SITE_OTHER): Payer: Medicare Other | Admitting: Family Medicine

## 2011-05-03 ENCOUNTER — Encounter: Payer: Self-pay | Admitting: Family Medicine

## 2011-05-03 VITALS — BP 130/80 | HR 68 | Temp 97.4°F | Ht 63.0 in | Wt 259.8 lb

## 2011-05-03 DIAGNOSIS — M47812 Spondylosis without myelopathy or radiculopathy, cervical region: Secondary | ICD-10-CM

## 2011-05-03 DIAGNOSIS — R7309 Other abnormal glucose: Secondary | ICD-10-CM

## 2011-05-03 DIAGNOSIS — E039 Hypothyroidism, unspecified: Secondary | ICD-10-CM

## 2011-05-03 DIAGNOSIS — E7800 Pure hypercholesterolemia, unspecified: Secondary | ICD-10-CM

## 2011-05-03 DIAGNOSIS — R739 Hyperglycemia, unspecified: Secondary | ICD-10-CM

## 2011-05-03 DIAGNOSIS — I10 Essential (primary) hypertension: Secondary | ICD-10-CM

## 2011-05-03 MED ORDER — METOPROLOL TARTRATE 100 MG PO TABS: 100.0000 mg | ORAL_TABLET | Freq: Two times a day (BID) | ORAL | Status: DC

## 2011-05-03 NOTE — Assessment & Plan Note (Signed)
tsh theraputic and no symptoms Ref lab with pt

## 2011-05-03 NOTE — Patient Instructions (Addendum)
Sugar and cholesterol numbers are improved from December  Keep working on low sugar and low fat diet -- and also weight loss Do any exercise you can do with your limitations If you end up needing an orthopedic referral let me know  Schedule follow up in 6 months with labs prior for annual exam

## 2011-05-03 NOTE — Assessment & Plan Note (Signed)
See overview.   

## 2011-05-03 NOTE — Progress Notes (Signed)
Subjective:    Patient ID: Valerie Ramirez, female    DOB: [birth year], 78 y.o.   MRN: [id number]  HPI Here for f/u of hypothyroid/ hyperglycemia, HTN and lipids  Last visit neck and back were painful  CS - and Ts - arthtitis and degenerative disc  No numbness , R arm feels weak in general - due to pain -- fell recently and landed on it  Hurts to pick things up  Has a cane if she needs it  Has been thinking about what to do with neck ([facility] did her knees)  Also chronic ankle pain and swelling in R  Not quite ready to go yet    bp is 130/80     Today No cp or palpitations or headaches or edema  No side effects to medicines    Lipids are improved Lab Results  Component Value Date   CHOL 194 03/24/2011   CHOL 206* 10/05/2010   CHOL 162 03/17/2010   Lab Results  Component Value Date   HDL 48.60 03/24/2011   HDL 40.98 10/05/2010   HDL 11.91 03/17/2010   Lab Results  Component Value Date   LDLCALC 124* 03/24/2011   LDLCALC 87 03/17/2010   LDLCALC 113* 04/23/2009   Lab Results  Component Value Date   TRIG 108.0 03/24/2011   TRIG 116.0 10/05/2010   TRIG 127.0 03/17/2010   Lab Results  Component Value Date   CHOLHDL 4 03/24/2011   CHOLHDL 4 10/05/2010   CHOLHDL 3 03/17/2010   Lab Results  Component Value Date   LDLDIRECT 139.5 10/05/2010   LDLDIRECT 143.9 01/21/2010   LDLDIRECT 141.8 10/17/2009   LDL is 120s now Does not tolerate  Diet--is fair overall - sometimes - not really motivated -- in general avoids fatty foods   Hyperglycemia improved a1c is 6.0 down from 6.5 Is eating less sugar - not as much sweet   Cannot really exercise due to pain  Hypothyroid Lab Results  Component Value Date   TSH 3.31 03/24/2011  no clinical changes/ hair or skin  No clinical symptoms   Patient Active Problem List  Diagnoses  . GOITER, MULTINODULAR  . HYPOTHYROIDISM NOS  . Hyperglycemia  . HYPERCHOLESTEROLEMIA, PURE  . HYPERTENSION  . CARDIOMYOPATHY  . OSTEOARTHRITIS    . NECK PAIN  . LEG EDEMA  . HEADACHE  . MIXED INCONTINENCE URGE AND STRESS  . LIVER FUNCTION TESTS, ABNORMAL  . OBESITY  . Other screening mammogram  . Neck pain  . Thoracic back pain  . Chest pain  . Degenerative joint disease of cervical spine   Past Medical History  Diagnosis Date  . Hypertension   . Hypothyroid   . Arthritis     OA  . Hyperlipidemia   . Diabetes mellitus     type II   Past Surgical History  Procedure Date  . Cholecystectomy   . Tubal ligation   . Joint replacement 07/1998  . Knee arthroplasty 05/23/1998    total right  . Polyp removal   . Hand surgery 12/2008    after hand fx/due to fall   History  Substance Use Topics  . Smoking status: Never Smoker   . Smokeless tobacco: Never Used  . Alcohol Use: No   Family History  Problem Relation Age of Onset  . Heart disease Sister     CABG  . Stroke Sister   . Heart disease Brother     CABG   Allergies  Allergen Reactions  .  ZOX:[id number]+XBJYNWGNF+AOZHYQMVHQ Acid+Aspartame     REACTION: stomach upset  . Lipitor (Atorvastatin Calcium) Other (See Comments)    Leg ache and ache all over  . Simvastatin     REACTION: muscle pain   Current Outpatient Prescriptions on File Prior to Visit  Medication Sig Dispense Refill  . amLODipine (NORVASC) 5 MG tablet TAKE ONE BY MOUTH DAILY  90 tablet  3  . aspirin 325 MG tablet Take 325-650 mg by mouth 3 (three) times daily as needed. For pain.       . cyanocobalamin 500 MCG tablet Take by mouth as directed.        . doxazosin (CARDURA) 8 MG tablet TAKE ONE BY MOUTH DAILY  90 tablet  3  . ECHINACEA PO Take 1 tablet by mouth daily.        [name] lisinopril (PRINIVIL,ZESTRIL) 5 MG tablet TAKE ONE BY MOUTH DAILY  90 tablet  3       Review of Systems Review of Systems  Constitutional: Negative for fever, appetite change, fatigue and unexpected weight change.  Eyes: Negative for pain and visual disturbance.  Respiratory: Negative for cough and shortness of  breath.   Cardiovascular: Negative for cp or palpitations    Gastrointestinal: Negative for nausea, diarrhea and constipation.  Genitourinary: Negative for urgency and frequency.  Skin: Negative for pallor or rash   MSK pos for neck and arm pain Neurological: Negative for weakness, light-headedness, numbness and headaches.  Hematological: Negative for adenopathy. Does not bruise/bleed easily.  Psychiatric/Behavioral: Negative for dysphoric mood. The patient is not nervous/anxious.      ROS      Objective:   Physical Exam  Constitutional: She appears well-developed and well-nourished. No distress.       Obese and well appearing   HENT:  Head: Normocephalic.  Mouth/Throat: Oropharynx is clear and moist. No oropharyngeal exudate.  Eyes: Conjunctivae and EOM are normal. Pupils are equal, round, and reactive to light. No scleral icterus.  Neck: Normal range of motion. Neck supple. No JVD present. Carotid bruit is not present. No thyromegaly present.  Cardiovascular: Normal rate, regular rhythm, normal heart sounds and intact distal pulses.  Exam reveals no gallop.   Pulmonary/Chest: Effort normal and breath sounds normal. No respiratory distress. She has no wheezes.  Abdominal: Soft. Bowel sounds are normal. She exhibits no distension, no abdominal bruit and no mass. There is no tenderness.  Musculoskeletal: She exhibits tenderness. She exhibits no edema.       Lower CS tenderness Nl rom neck Nl grip/ UE strength and sensation  Lymphadenopathy:    She has no cervical adenopathy.  Neurological: She is alert. She has normal reflexes. No cranial nerve deficit. She exhibits normal muscle tone. Coordination normal.  Skin: Skin is warm and dry. No rash noted. No erythema. No pallor.  Psychiatric: She has a normal mood and affect.          Assessment & Plan:

## 2011-05-03 NOTE — Assessment & Plan Note (Signed)
bp in fair control at this time  No changes needed  Disc lifstyle change with low sodium diet and exercise   Refilled beta blocker today Enc wt loss

## 2011-05-03 NOTE — Assessment & Plan Note (Signed)
Disc goals for lipids and reasons to control them Rev labs with pt Rev low sat fat diet in detail  Improved- cannot tolerate statins Disc imp of LDL under 100 Not very motivated to change

## 2011-05-03 NOTE — Assessment & Plan Note (Signed)
Improved a1c 6.0 Disc imp of wt loss and good diet Pt not very motivated

## 2011-06-17 ENCOUNTER — Ambulatory Visit: Payer: Self-pay | Admitting: Family Medicine

## 2011-06-17 DIAGNOSIS — Z1231 Encounter for screening mammogram for malignant neoplasm of breast: Secondary | ICD-10-CM | POA: Diagnosis not present

## 2011-06-18 ENCOUNTER — Encounter: Payer: Self-pay | Admitting: Family Medicine

## 2011-06-22 ENCOUNTER — Encounter: Payer: Self-pay | Admitting: Family Medicine

## 2011-06-22 ENCOUNTER — Encounter: Payer: Self-pay | Admitting: *Deleted

## 2011-10-25 ENCOUNTER — Telehealth: Payer: Self-pay | Admitting: Family Medicine

## 2011-10-25 DIAGNOSIS — R739 Hyperglycemia, unspecified: Secondary | ICD-10-CM

## 2011-10-25 DIAGNOSIS — I10 Essential (primary) hypertension: Secondary | ICD-10-CM

## 2011-10-25 DIAGNOSIS — E7800 Pure hypercholesterolemia, unspecified: Secondary | ICD-10-CM

## 2011-10-25 NOTE — Telephone Encounter (Signed)
Message copied by Judy Pimple on Mon Oct 25, 2011  8:55 AM ------      Message from: Alvina Chou      Created: Mon Oct 25, 2011  8:51 AM      Regarding: Labs for Tuesday, 7.10.13       Labs for 6 month f/u, thanks

## 2011-10-26 ENCOUNTER — Other Ambulatory Visit (INDEPENDENT_AMBULATORY_CARE_PROVIDER_SITE_OTHER): Payer: Medicare Other

## 2011-10-26 DIAGNOSIS — R7309 Other abnormal glucose: Secondary | ICD-10-CM | POA: Diagnosis not present

## 2011-10-26 DIAGNOSIS — E7800 Pure hypercholesterolemia, unspecified: Secondary | ICD-10-CM | POA: Diagnosis not present

## 2011-10-26 DIAGNOSIS — R739 Hyperglycemia, unspecified: Secondary | ICD-10-CM

## 2011-10-26 DIAGNOSIS — I10 Essential (primary) hypertension: Secondary | ICD-10-CM

## 2011-10-26 LAB — LIPID PANEL
Cholesterol: 185 mg/dL (ref 0–200)
HDL: 49.2 mg/dL (ref >39.00)
LDL Cholesterol: 109 mg/dL — ABNORMAL HIGH (ref 0–99)
Total CHOL/HDL Ratio: 4
Triglycerides: 132 mg/dL (ref 0.0–149.0)
VLDL: 26.4 mg/dL (ref 0.0–40.0)

## 2011-10-26 LAB — COMPREHENSIVE METABOLIC PANEL
ALT: 14 U/L (ref 0–35)
AST: 15 U/L (ref 0–37)
Albumin: 3.6 g/dL (ref 3.5–5.2)
Alkaline Phosphatase: 89 U/L (ref 39–117)
BUN: 24 mg/dL — ABNORMAL HIGH (ref 6–23)
CO2: 23 mEq/L (ref 19–32)
Calcium: 9 mg/dL (ref 8.4–10.5)
Chloride: 108 mEq/L (ref 96–112)
Creatinine, Ser: 1.2 mg/dL (ref 0.4–1.2)
GFR: 44.97 mL/min — ABNORMAL LOW (ref >60.00)
Glucose, Bld: 118 mg/dL — ABNORMAL HIGH (ref 70–99)
Potassium: 4.2 mEq/L (ref 3.5–5.1)
Sodium: 141 mEq/L (ref 135–145)
Total Bilirubin: 0.7 mg/dL (ref 0.3–1.2)
Total Protein: 7.2 g/dL (ref 6.0–8.3)

## 2011-10-26 LAB — HEMOGLOBIN A1C: Hgb A1c MFr Bld: 6.5 % (ref 4.6–6.5)

## 2011-11-02 ENCOUNTER — Ambulatory Visit (INDEPENDENT_AMBULATORY_CARE_PROVIDER_SITE_OTHER): Payer: Medicare Other | Admitting: Family Medicine

## 2011-11-02 ENCOUNTER — Encounter: Payer: Self-pay | Admitting: Family Medicine

## 2011-11-02 VITALS — BP 122/74 | HR 68 | Temp 98.0°F | Ht 63.0 in | Wt 263.0 lb

## 2011-11-02 DIAGNOSIS — Z23 Encounter for immunization: Secondary | ICD-10-CM

## 2011-11-02 DIAGNOSIS — R7309 Other abnormal glucose: Secondary | ICD-10-CM

## 2011-11-02 DIAGNOSIS — E669 Obesity, unspecified: Secondary | ICD-10-CM | POA: Diagnosis not present

## 2011-11-02 DIAGNOSIS — E7800 Pure hypercholesterolemia, unspecified: Secondary | ICD-10-CM

## 2011-11-02 DIAGNOSIS — I10 Essential (primary) hypertension: Secondary | ICD-10-CM | POA: Diagnosis not present

## 2011-11-02 DIAGNOSIS — R739 Hyperglycemia, unspecified: Secondary | ICD-10-CM

## 2011-11-02 NOTE — Assessment & Plan Note (Signed)
Discussed how this problem influences overall health and the risks it imposes  Reviewed plan for weight loss with lower calorie diet (via better food choices and also portion control or program like weight watchers) and exercise building up to or more than 30 minutes 5 days per week including some aerobic activity    Pt is physically limited and not very motivated  Disc alternative low impact opt for exercise

## 2011-11-02 NOTE — Assessment & Plan Note (Signed)
a1c of 6.5 now qualifies her for mild DM 2- made her aware of this Disc imp of low glycemic diet and wt loss She voiced understanding, but has a lot of excuses and is not motivated for change Could consider DM teaching if not imp in 3 mo (will get a1c and f/u)

## 2011-11-02 NOTE — Assessment & Plan Note (Signed)
This is improved- LDL 109 with goal of 100 or below Disc diet change in detail  Will work on that  Unable to do much exercise due to physical limitations unfortunately

## 2011-11-02 NOTE — Assessment & Plan Note (Signed)
bp in fair control at this time  No changes needed  Disc lifstyle change with low sodium diet and exercise   

## 2011-11-02 NOTE — Progress Notes (Signed)
Subjective:    Patient ID: Valerie Ramirez, female    DOB: 1932-05-21, 78 y.o.   MRN: [id number]  HPI Here for chronic conditions  bp is   Good   Today BP Readings from Last 3 Encounters:  11/02/11 122/74  05/03/11 130/80  03/31/11 128/72    No cp or palpitations or headaches or edema  No side effects to medicines     Wt is up 4 lb with bmi of 46 a1c - hyperglycemia is up to 6.5 from 6 - official DM range  Knows what she did to bring it up  Went through a phase of eating lots of sweets- esp candy Want ahead then and threw away all the candy    Lipids Lab Results  Component Value Date   CHOL 185 10/26/2011   CHOL 194 03/24/2011   CHOL 206* 10/05/2010   Lab Results  Component Value Date   HDL 49.20 10/26/2011   HDL 48.60 03/24/2011   HDL 40.98 10/05/2010   Lab Results  Component Value Date   LDLCALC 109* 10/26/2011   LDLCALC 124* 03/24/2011   LDLCALC 87 03/17/2010   Lab Results  Component Value Date   TRIG 132.0 10/26/2011   TRIG 108.0 03/24/2011   TRIG 116.0 10/05/2010   Lab Results  Component Value Date   CHOLHDL 4 10/26/2011   CHOLHDL 4 03/24/2011   CHOLHDL 4 10/05/2010   Lab Results  Component Value Date   LDLDIRECT 139.5 10/05/2010   LDLDIRECT 143.9 01/21/2010   LDLDIRECT 141.8 10/17/2009   diet controllled  Does not eat a lot of greasy foods - but some   Is not able to get any exercise at all due to chronic pain  Could possibly do walking in front  Thursday sees Dr Ophelia Charter   Needs Td-- is ok   Patient Active Problem List  Diagnosis  . GOITER, MULTINODULAR  . HYPOTHYROIDISM NOS  . Hyperglycemia  . HYPERCHOLESTEROLEMIA, PURE  . HYPERTENSION  . CARDIOMYOPATHY  . OSTEOARTHRITIS  . NECK PAIN  . LEG EDEMA  . HEADACHE  . MIXED INCONTINENCE URGE AND STRESS  . LIVER FUNCTION TESTS, ABNORMAL  . OBESITY  . Other screening mammogram  . Neck pain  . Thoracic back pain  . Chest pain  . Degenerative joint disease of cervical spine   Past Medical History    Diagnosis Date  . Hypertension   . Hypothyroid   . Arthritis     OA  . Hyperlipidemia   . Diabetes mellitus     type II   Past Surgical History  Procedure Date  . Cholecystectomy   . Tubal ligation   . Joint replacement 07/1998  . Knee arthroplasty 05/23/1998    total right  . Polyp removal   . Hand surgery 12/2008    after hand fx/due to fall   History  Substance Use Topics  . Smoking status: Never Smoker   . Smokeless tobacco: Never Used  . Alcohol Use: No   Family History  Problem Relation Age of Onset  . Heart disease Sister     CABG  . Stroke Sister   . Heart disease Brother     CABG   Allergies  Allergen Reactions  . Amoxicillin-Pot Clavulanate     REACTION: stomach upset  . Lipitor (Atorvastatin Calcium) Other (See Comments)    Leg ache and ache all over  . Simvastatin     REACTION: muscle pain   Current Outpatient Prescriptions on File Prior  to Visit  Medication Sig Dispense Refill  . amLODipine (NORVASC) 5 MG tablet TAKE ONE BY MOUTH DAILY  90 tablet  3  . aspirin 325 MG tablet Take 325-650 mg by mouth 3 (three) times daily as needed. For pain.       . cyanocobalamin 500 MCG tablet Take by mouth as directed.        . doxazosin (CARDURA) 8 MG tablet TAKE ONE BY MOUTH DAILY  90 tablet  3  . ECHINACEA PO Take 1 tablet by mouth daily.        [name] lisinopril (PRINIVIL,ZESTRIL) 5 MG tablet TAKE ONE BY MOUTH DAILY  90 tablet  3  . metoprolol (LOPRESSOR) 100 MG tablet Take 1 tablet (100 mg total) by mouth 2 (two) times daily.  180 tablet  3      Review of Systems    Review of Systems  Constitutional: Negative for fever, appetite change, and unexpected weight change. pos for chronic fatigue  Eyes: Negative for pain and visual disturbance.  Respiratory: Negative for cough and shortness of breath.   Cardiovascular: Negative for cp or palpitations    Gastrointestinal: Negative for nausea, diarrhea and constipation.  Genitourinary: Negative for urgency and  frequency.  Skin: Negative for pallor or rash   MSK pos for chronic joint and back pain , neg for joint swelling  Neurological: Negative for weakness, light-headedness, numbness and headaches.  Hematological: Negative for adenopathy. Does not bruise/bleed easily.  Psychiatric/Behavioral: Negative for dysphoric mood. The patient is not nervous/anxious.      Objective:   Physical Exam  Constitutional: She appears well-developed and well-nourished. No distress.       Obese and well appearing   HENT:  Head: Normocephalic and atraumatic.  Mouth/Throat: Oropharynx is clear and moist.  Eyes: Conjunctivae and EOM are normal. Pupils are equal, round, and reactive to light. No scleral icterus.  Neck: Normal range of motion. Neck supple. No JVD present. Carotid bruit is not present. No thyromegaly present.  Cardiovascular: Normal rate, regular rhythm, normal heart sounds and intact distal pulses.   No murmur heard. Pulmonary/Chest: Effort normal and breath sounds normal. No respiratory distress. She has no wheezes.  Abdominal: Soft. Bowel sounds are normal. She exhibits no distension, no abdominal bruit and no mass. There is no tenderness.  Musculoskeletal: Normal range of motion. She exhibits no edema and no tenderness.       Difficulty getting on the table due to joint pain and weight  Lymphadenopathy:    She has no cervical adenopathy.  Neurological: She is alert. She has normal reflexes. No cranial nerve deficit. She exhibits normal muscle tone. Coordination normal.  Skin: Skin is warm and dry. No rash noted. No erythema. No pallor.  Psychiatric: She has a normal mood and affect.          Assessment & Plan:

## 2011-11-02 NOTE — Patient Instructions (Addendum)
tetnus shot today  bp is good  Cholesterol is almost to goal --Avoid red meat/ fried foods/ egg yolks/ fatty breakfast meats/ butter, cheese and high fat dairy/ and shellfish   For diabetes -- please avoid sweets/ sugar drinks and cut portions of carbs like bread /pasta/ rice  Exercise - whatever you can do to loose some weight  Schedule non fasting lab in 3 months and then follow up

## 2011-11-04 DIAGNOSIS — M719 Bursopathy, unspecified: Secondary | ICD-10-CM | POA: Diagnosis not present

## 2011-11-04 DIAGNOSIS — M25519 Pain in unspecified shoulder: Secondary | ICD-10-CM | POA: Diagnosis not present

## 2011-11-04 DIAGNOSIS — M67919 Unspecified disorder of synovium and tendon, unspecified shoulder: Secondary | ICD-10-CM | POA: Diagnosis not present

## 2011-11-18 ENCOUNTER — Other Ambulatory Visit: Payer: Self-pay | Admitting: Orthopaedic Surgery

## 2011-11-18 DIAGNOSIS — M25511 Pain in right shoulder: Secondary | ICD-10-CM

## 2011-11-21 ENCOUNTER — Ambulatory Visit
Admission: RE | Admit: 2011-11-21 | Discharge: 2011-11-21 | Disposition: A | Discharge status: Home / Self Care | Payer: Medicare Other | Source: Ambulatory Visit | Attending: Orthopaedic Surgery | Admitting: Orthopaedic Surgery

## 2011-11-21 DIAGNOSIS — M25511 Pain in right shoulder: Secondary | ICD-10-CM

## 2011-11-21 DIAGNOSIS — M25519 Pain in unspecified shoulder: Secondary | ICD-10-CM | POA: Diagnosis not present

## 2011-11-23 DIAGNOSIS — M719 Bursopathy, unspecified: Secondary | ICD-10-CM | POA: Diagnosis not present

## 2011-11-23 DIAGNOSIS — M25519 Pain in unspecified shoulder: Secondary | ICD-10-CM | POA: Diagnosis not present

## 2011-11-23 DIAGNOSIS — M67919 Unspecified disorder of synovium and tendon, unspecified shoulder: Secondary | ICD-10-CM | POA: Diagnosis not present

## 2011-11-29 DIAGNOSIS — M24119 Other articular cartilage disorders, unspecified shoulder: Secondary | ICD-10-CM | POA: Diagnosis not present

## 2011-11-29 DIAGNOSIS — M75120 Complete rotator cuff tear or rupture of unspecified shoulder, not specified as traumatic: Secondary | ICD-10-CM | POA: Diagnosis not present

## 2011-11-29 DIAGNOSIS — M25519 Pain in unspecified shoulder: Secondary | ICD-10-CM | POA: Diagnosis not present

## 2011-11-29 DIAGNOSIS — M719 Bursopathy, unspecified: Secondary | ICD-10-CM | POA: Diagnosis not present

## 2011-11-29 DIAGNOSIS — M7520 Bicipital tendinitis, unspecified shoulder: Secondary | ICD-10-CM | POA: Diagnosis not present

## 2011-11-29 DIAGNOSIS — M67919 Unspecified disorder of synovium and tendon, unspecified shoulder: Secondary | ICD-10-CM | POA: Diagnosis not present

## 2012-01-05 DIAGNOSIS — M75120 Complete rotator cuff tear or rupture of unspecified shoulder, not specified as traumatic: Secondary | ICD-10-CM | POA: Diagnosis not present

## 2012-01-05 DIAGNOSIS — M6281 Muscle weakness (generalized): Secondary | ICD-10-CM | POA: Diagnosis not present

## 2012-01-05 DIAGNOSIS — M25519 Pain in unspecified shoulder: Secondary | ICD-10-CM | POA: Diagnosis not present

## 2012-01-05 DIAGNOSIS — M25669 Stiffness of unspecified knee, not elsewhere classified: Secondary | ICD-10-CM | POA: Diagnosis not present

## 2012-01-11 DIAGNOSIS — M75120 Complete rotator cuff tear or rupture of unspecified shoulder, not specified as traumatic: Secondary | ICD-10-CM | POA: Diagnosis not present

## 2012-01-11 DIAGNOSIS — M25669 Stiffness of unspecified knee, not elsewhere classified: Secondary | ICD-10-CM | POA: Diagnosis not present

## 2012-01-11 DIAGNOSIS — M25519 Pain in unspecified shoulder: Secondary | ICD-10-CM | POA: Diagnosis not present

## 2012-01-11 DIAGNOSIS — M6281 Muscle weakness (generalized): Secondary | ICD-10-CM | POA: Diagnosis not present

## 2012-01-13 ENCOUNTER — Other Ambulatory Visit: Payer: Self-pay | Admitting: Family Medicine

## 2012-01-13 DIAGNOSIS — M6281 Muscle weakness (generalized): Secondary | ICD-10-CM | POA: Diagnosis not present

## 2012-01-13 DIAGNOSIS — M25519 Pain in unspecified shoulder: Secondary | ICD-10-CM | POA: Diagnosis not present

## 2012-01-13 DIAGNOSIS — M25669 Stiffness of unspecified knee, not elsewhere classified: Secondary | ICD-10-CM | POA: Diagnosis not present

## 2012-01-13 DIAGNOSIS — M75120 Complete rotator cuff tear or rupture of unspecified shoulder, not specified as traumatic: Secondary | ICD-10-CM | POA: Diagnosis not present

## 2012-01-18 DIAGNOSIS — M6281 Muscle weakness (generalized): Secondary | ICD-10-CM | POA: Diagnosis not present

## 2012-01-18 DIAGNOSIS — M75120 Complete rotator cuff tear or rupture of unspecified shoulder, not specified as traumatic: Secondary | ICD-10-CM | POA: Diagnosis not present

## 2012-01-18 DIAGNOSIS — M25669 Stiffness of unspecified knee, not elsewhere classified: Secondary | ICD-10-CM | POA: Diagnosis not present

## 2012-01-18 DIAGNOSIS — M25519 Pain in unspecified shoulder: Secondary | ICD-10-CM | POA: Diagnosis not present

## 2012-01-20 DIAGNOSIS — M75120 Complete rotator cuff tear or rupture of unspecified shoulder, not specified as traumatic: Secondary | ICD-10-CM | POA: Diagnosis not present

## 2012-01-20 DIAGNOSIS — M25519 Pain in unspecified shoulder: Secondary | ICD-10-CM | POA: Diagnosis not present

## 2012-01-20 DIAGNOSIS — M25669 Stiffness of unspecified knee, not elsewhere classified: Secondary | ICD-10-CM | POA: Diagnosis not present

## 2012-01-20 DIAGNOSIS — M6281 Muscle weakness (generalized): Secondary | ICD-10-CM | POA: Diagnosis not present

## 2012-01-20 DIAGNOSIS — Z23 Encounter for immunization: Secondary | ICD-10-CM | POA: Diagnosis not present

## 2012-01-31 ENCOUNTER — Other Ambulatory Visit (INDEPENDENT_AMBULATORY_CARE_PROVIDER_SITE_OTHER): Payer: Medicare Other

## 2012-01-31 DIAGNOSIS — R739 Hyperglycemia, unspecified: Secondary | ICD-10-CM

## 2012-01-31 DIAGNOSIS — R7309 Other abnormal glucose: Secondary | ICD-10-CM

## 2012-01-31 LAB — HEMOGLOBIN A1C: Hgb A1c MFr Bld: 6.1 % (ref 4.6–6.5)

## 2012-02-04 ENCOUNTER — Encounter: Payer: Self-pay | Admitting: Family Medicine

## 2012-02-04 ENCOUNTER — Ambulatory Visit (INDEPENDENT_AMBULATORY_CARE_PROVIDER_SITE_OTHER): Payer: Medicare Other | Admitting: Family Medicine

## 2012-02-04 VITALS — BP 144/72 | HR 76 | Temp 97.4°F | Ht 63.0 in | Wt 262.2 lb

## 2012-02-04 DIAGNOSIS — R7309 Other abnormal glucose: Secondary | ICD-10-CM

## 2012-02-04 DIAGNOSIS — E669 Obesity, unspecified: Secondary | ICD-10-CM

## 2012-02-04 DIAGNOSIS — R739 Hyperglycemia, unspecified: Secondary | ICD-10-CM

## 2012-02-04 DIAGNOSIS — I10 Essential (primary) hypertension: Secondary | ICD-10-CM | POA: Diagnosis not present

## 2012-02-04 NOTE — Assessment & Plan Note (Signed)
Discussed how this problem influences overall health and the risks it imposes  Reviewed plan for weight loss with lower calorie diet (via better food choices and also portion control or program like weight watchers) and exercise building up to or more than 30 minutes 5 days per week including some aerobic activity    Unsure how motivated pt is for this - but happy her a1c came down so she is working on the sugar intake

## 2012-02-04 NOTE — Progress Notes (Signed)
Subjective:    Patient ID: Valerie Ramirez, female    DOB: 1933/01/16, 78 y.o.   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 GOITER, MULTINODULAR  . HYPOTHYROIDISM NOS  . Hyperglycemia  . HYPERCHOLESTEROLEMIA, PURE  . HYPERTENSION  . CARDIOMYOPATHY  . OSTEOARTHRITIS  . NECK PAIN  . LEG EDEMA  . HEADACHE  . MIXED INCONTINENCE URGE AND STRESS  . LIVER FUNCTION TESTS, ABNORMAL  . OBESITY  . Other screening mammogram  . Neck pain  . Thoracic back pain  . Chest pain  . Degenerative joint disease of cervical spine   Past Medical History  Diagnosis Date  . Hypertension   . Hypothyroid   . Arthritis     OA  . Hyperlipidemia   . Diabetes mellitus     type II   Past Surgical History  Procedure Date  . Cholecystectomy   . Tubal ligation   . Joint replacement 07/1998  . Knee arthroplasty 05/23/1998    total right  . Polyp removal   . Hand surgery 12/2008    after hand fx/due to fall   History  Substance Use Topics  . Smoking status: Never Smoker   . Smokeless tobacco: Never Used  . Alcohol Use: No   Family History  Problem Relation Age of Onset  . Heart disease Sister     CABG  . Stroke Sister   . Heart disease Brother     CABG   Allergies  Allergen Reactions  . Amoxicillin-Pot Clavulanate     REACTION: stomach upset  . Lipitor (Atorvastatin Calcium) Other (See Comments)    Leg ache and ache all over  . Simvastatin     REACTION: muscle pain   Current Outpatient Prescriptions on File Prior to Visit  Medication Sig Dispense Refill  . amLODipine (NORVASC) 5 MG tablet TAKE ONE BY MOUTH DAILY  30 tablet  0  . aspirin 325 MG tablet Take 325-650 mg by mouth as needed. For pain.      . cyanocobalamin 500 MCG tablet Take by mouth as directed.        . doxazosin (CARDURA) 8 MG tablet TAKE ONE BY MOUTH DAILY  30 tablet  0  . ECHINACEA PO Take 1 tablet by mouth daily.        [name] lisinopril (PRINIVIL,ZESTRIL) 5 MG tablet TAKE ONE BY MOUTH DAILY  30 tablet  0  . metoprolol (LOPRESSOR) 100 MG tablet Take 1 tablet (100 mg total) by mouth 2 (two) times daily.  180 tablet  3  Review of Systems Review of Systems  Constitutional: Negative for fever, appetite change, fatigue and unexpected weight change.  Eyes: Negative for pain and visual disturbance.  Respiratory: Negative for cough and shortness of breath.   Cardiovascular: Negative for cp or palpitations    Gastrointestinal: Negative for nausea, diarrhea and constipation.  Genitourinary: Negative for urgency and frequency.  Skin: Negative for pallor or rash   Neurological: Negative for weakness, light-headedness, numbness and headaches.  Hematological: Negative for adenopathy. Does not bruise/bleed easily.  Psychiatric/Behavioral: Negative for dysphoric mood. The patient is not nervous/anxious.         Objective:   Physical Exam  Constitutional: She appears well-developed and well-nourished. No distress.  HENT:  Head: Normocephalic and atraumatic.  Right Ear: External ear normal.  Left Ear: External ear normal.  Nose: Nose normal.  Mouth/Throat: Oropharynx is clear and moist.  Eyes: Conjunctivae normal and EOM are normal. Pupils are equal, round,  and reactive to light. No scleral icterus.  Neck: Normal range of motion. Neck supple. No JVD present. Carotid bruit is not present. No thyromegaly present.  Cardiovascular: Normal rate, regular rhythm, normal heart sounds and intact distal pulses.  Exam reveals no gallop.   Pulmonary/Chest: Effort normal and breath sounds normal. No respiratory distress. She has no wheezes.  Abdominal: Soft. Bowel sounds are normal. She exhibits no distension and no abdominal bruit. There is no tenderness.  Musculoskeletal: She exhibits no edema.  Lymphadenopathy:    She has no cervical adenopathy.  Neurological: She is alert. She has normal reflexes. No cranial nerve deficit. She exhibits normal muscle tone. Coordination normal.  Skin: Skin is warm and dry. No rash noted. No erythema. No pallor.  Psychiatric: She has a normal mood and affect.          Assessment & Plan:

## 2012-02-04 NOTE — Assessment & Plan Note (Signed)
This is improved with a1c down to 6.1 Commended diet effort - less sugar/ sweets Enc wt loss to prevent DM  No symptoms Is more active now that her arm surgery is done

## 2012-02-04 NOTE — Patient Instructions (Addendum)
Your labs look better Work on exercise and weight loss (especially low sugar diet )  Follow up in about 6 months for annual exam with labs prior

## 2012-02-06 NOTE — Assessment & Plan Note (Signed)
bp in fair control at this time  No changes needed  Disc lifstyle change with low sodium diet and exercise   

## 2012-03-23 ENCOUNTER — Other Ambulatory Visit: Payer: Self-pay | Admitting: Family Medicine

## 2012-04-18 DIAGNOSIS — M48061 Spinal stenosis, lumbar region without neurogenic claudication: Secondary | ICD-10-CM | POA: Diagnosis not present

## 2012-04-18 DIAGNOSIS — M545 Low back pain, unspecified: Secondary | ICD-10-CM | POA: Diagnosis not present

## 2012-05-11 ENCOUNTER — Other Ambulatory Visit: Payer: Self-pay | Admitting: Family Medicine

## 2012-05-16 DIAGNOSIS — M48061 Spinal stenosis, lumbar region without neurogenic claudication: Secondary | ICD-10-CM | POA: Diagnosis not present

## 2012-05-17 ENCOUNTER — Other Ambulatory Visit: Payer: Self-pay | Admitting: Orthopaedic Surgery

## 2012-05-17 DIAGNOSIS — I739 Peripheral vascular disease, unspecified: Secondary | ICD-10-CM

## 2012-05-17 DIAGNOSIS — M4800 Spinal stenosis, site unspecified: Secondary | ICD-10-CM

## 2012-05-18 ENCOUNTER — Ambulatory Visit
Admission: RE | Admit: 2012-05-18 | Discharge: 2012-05-18 | Disposition: A | Discharge status: Home / Self Care | Payer: Medicare Other | Source: Ambulatory Visit | Attending: Orthopaedic Surgery | Admitting: Orthopaedic Surgery

## 2012-05-18 DIAGNOSIS — M5137 Other intervertebral disc degeneration, lumbosacral region: Secondary | ICD-10-CM | POA: Diagnosis not present

## 2012-05-18 DIAGNOSIS — I739 Peripheral vascular disease, unspecified: Secondary | ICD-10-CM

## 2012-05-18 DIAGNOSIS — M4800 Spinal stenosis, site unspecified: Secondary | ICD-10-CM

## 2012-05-18 DIAGNOSIS — M48061 Spinal stenosis, lumbar region without neurogenic claudication: Secondary | ICD-10-CM | POA: Diagnosis not present

## 2012-05-18 DIAGNOSIS — M47817 Spondylosis without myelopathy or radiculopathy, lumbosacral region: Secondary | ICD-10-CM | POA: Diagnosis not present

## 2012-05-25 ENCOUNTER — Encounter: Payer: Self-pay | Admitting: Gynecology

## 2012-05-25 ENCOUNTER — Ambulatory Visit (INDEPENDENT_AMBULATORY_CARE_PROVIDER_SITE_OTHER): Payer: Medicare Other | Admitting: Gynecology

## 2012-05-25 VITALS — BP 136/88 | Ht 63.0 in | Wt 234.0 lb

## 2012-05-25 DIAGNOSIS — N950 Postmenopausal bleeding: Secondary | ICD-10-CM | POA: Diagnosis not present

## 2012-05-25 DIAGNOSIS — Z8542 Personal history of malignant neoplasm of other parts of uterus: Secondary | ICD-10-CM | POA: Insufficient documentation

## 2012-05-25 DIAGNOSIS — C549 Malignant neoplasm of corpus uteri, unspecified: Secondary | ICD-10-CM | POA: Diagnosis not present

## 2012-05-25 LAB — CBC WITH DIFFERENTIAL/PLATELET
Basophils Absolute: 0.1 10*3/uL (ref 0.0–0.1)
Basophils Relative: 0 % (ref 0–1)
Eosinophils Absolute: 0.3 10*3/uL (ref 0.0–0.7)
Eosinophils Relative: 3 % (ref 0–5)
HCT: 40 % (ref 36.0–46.0)
Hemoglobin: 13.5 g/dL (ref 12.0–15.0)
Lymphocytes Relative: 30 % (ref 12–46)
Lymphs Abs: 3.4 10*3/uL (ref 0.7–4.0)
MCH: 30.1 pg (ref 26.0–34.0)
MCHC: 33.8 g/dL (ref 30.0–36.0)
MCV: 89.1 fL (ref 78.0–100.0)
Monocytes Absolute: 1.3 10*3/uL — ABNORMAL HIGH (ref 0.1–1.0)
Monocytes Relative: 11 % (ref 3–12)
Neutro Abs: 6.2 10*3/uL (ref 1.7–7.7)
Neutrophils Relative %: 56 % (ref 43–77)
Platelets: 208 10*3/uL (ref 150–400)
RBC: 4.49 MIL/uL (ref 3.87–5.11)
RDW: 14.8 % (ref 11.5–15.5)
WBC: 11.2 10*3/uL — ABNORMAL HIGH (ref 4.0–10.5)

## 2012-05-25 NOTE — Patient Instructions (Addendum)

## 2012-05-25 NOTE — Progress Notes (Signed)
Patient is a 79 year old who was referred to our practice by Dr. Ophelia Charter (orthopedic surgeon) who was in the process of evaluating patient for back pain and an MRI had been ordered on January 30 of this year and an incidental finding of endometrial thickening was noted. Patient had informed me that on December 28 she bled with passage of large blood clots that lasted for 2 days. Patient is not on any hormone replacement therapy. Patient has not seen a gynecologist in over 15 years. Patient denies any prior history of abnormal Pap smear. Please see past medical history and medication list in epic for additional details.  Exam: Obese white female with a weight of 234 pounds at 5 feet 3 inches tall with a BMI of 41.45 kg/meter squared  Abdomen soft nontender pendulous Pelvic: Bartholin urethra Skene was within normal limits Vagina: No lesions or discharge slight amount of blood was noted at the cervical os Uterus: Somewhat limited due to patient's abdominal girth Adnexa: Limited due to patient's abdominal girth Rectal exam: Not done  Patient was counseled for an endometrial biopsy as part of the evaluation for her postmenopausal bleeding. The cervix was cleansed with Betadine solution. The anterior cervical lip was grasped with a single-tooth tenaculum and the uterus sounded to 7 cm. Moderate amount of tissue was obtained and was submitted for histological evaluation. The single-tooth tenaculum was removed. Patient tolerated procedure well.  Assessment/plan: Postmenopausal bleeding. Endometrial biopsy performed result pending at time of this dictation. A CBC will be drawn today. Patient will return back to the office next week for sonohysterogram to rule out any intracavitary abnormalities such as endometrial polyp or submucous myoma. We will discuss the results of today's biopsy as well.

## 2012-05-30 DIAGNOSIS — M48061 Spinal stenosis, lumbar region without neurogenic claudication: Secondary | ICD-10-CM | POA: Diagnosis not present

## 2012-05-30 DIAGNOSIS — M545 Low back pain, unspecified: Secondary | ICD-10-CM | POA: Diagnosis not present

## 2012-05-31 ENCOUNTER — Telehealth: Payer: Self-pay | Admitting: *Deleted

## 2012-05-31 DIAGNOSIS — C541 Malignant neoplasm of endometrium: Secondary | ICD-10-CM

## 2012-05-31 NOTE — Telephone Encounter (Signed)
Per JF scheduled: Chest x-ray PA and lateral, CT of the abdomen and pelvis with and without contrast. She will also need a CA125 blood test and appointment for GYN consultation .   Pt informed appointment for CT is on 06/05/12 @ 1:30pm at [facility] pt informed only liquds after 9:30 am the morning of CT.she will pick up contrast day before exam. She can have chest x-ray done anytime between hours 8:30 am & 4:30pm no appointment needed. Labs orders placed she will have this done at [facility] lab BUN & Creatine (needed for CT scan) added to CA125 . GYN consultschedule on 06/08/12 @ 1:00 pm with Dr.Brewster at [facility]

## 2012-06-02 ENCOUNTER — Other Ambulatory Visit: Payer: Medicare Other

## 2012-06-02 ENCOUNTER — Ambulatory Visit: Payer: Medicare Other | Admitting: Gynecology

## 2012-06-05 ENCOUNTER — Other Ambulatory Visit: Payer: Self-pay | Admitting: Gynecology

## 2012-06-05 ENCOUNTER — Ambulatory Visit (HOSPITAL_COMMUNITY)
Admission: RE | Admit: 2012-06-05 | Discharge: 2012-06-05 | Disposition: A | Discharge status: Home / Self Care | Payer: Medicare Other | Source: Ambulatory Visit | Attending: Gynecology | Admitting: Gynecology

## 2012-06-05 ENCOUNTER — Other Ambulatory Visit: Payer: Self-pay | Admitting: *Deleted

## 2012-06-05 DIAGNOSIS — J811 Chronic pulmonary edema: Secondary | ICD-10-CM | POA: Diagnosis not present

## 2012-06-05 DIAGNOSIS — C549 Malignant neoplasm of corpus uteri, unspecified: Secondary | ICD-10-CM

## 2012-06-05 DIAGNOSIS — I517 Cardiomegaly: Secondary | ICD-10-CM | POA: Diagnosis not present

## 2012-06-05 DIAGNOSIS — I700 Atherosclerosis of aorta: Secondary | ICD-10-CM | POA: Diagnosis not present

## 2012-06-05 DIAGNOSIS — N281 Cyst of kidney, acquired: Secondary | ICD-10-CM | POA: Insufficient documentation

## 2012-06-05 DIAGNOSIS — Z9089 Acquired absence of other organs: Secondary | ICD-10-CM | POA: Insufficient documentation

## 2012-06-05 DIAGNOSIS — J90 Pleural effusion, not elsewhere classified: Secondary | ICD-10-CM | POA: Diagnosis not present

## 2012-06-05 DIAGNOSIS — C541 Malignant neoplasm of endometrium: Secondary | ICD-10-CM

## 2012-06-05 DIAGNOSIS — K449 Diaphragmatic hernia without obstruction or gangrene: Secondary | ICD-10-CM | POA: Insufficient documentation

## 2012-06-05 LAB — CREATININE, SERUM
Creatinine, Ser: 1.01 mg/dL (ref 0.50–1.10)
GFR calc Af Amer: 61 mL/min — ABNORMAL LOW (ref >90)
GFR calc non Af Amer: 52 mL/min — ABNORMAL LOW (ref >90)

## 2012-06-05 LAB — BUN: BUN: 18 mg/dL (ref 6–23)

## 2012-06-05 MED ORDER — IOHEXOL 300 MG/ML  SOLN
100.0000 mL | Freq: Once | INTRAMUSCULAR | Status: AC | PRN
  Administered 2012-06-05: 100 mL via INTRAVENOUS

## 2012-06-06 ENCOUNTER — Telehealth: Payer: Self-pay | Admitting: Gynecology

## 2012-06-06 DIAGNOSIS — M48061 Spinal stenosis, lumbar region without neurogenic claudication: Secondary | ICD-10-CM | POA: Diagnosis not present

## 2012-06-06 DIAGNOSIS — M545 Low back pain, unspecified: Secondary | ICD-10-CM | POA: Diagnosis not present

## 2012-06-06 LAB — CA 125: CA 125: 17.8 U/mL (ref 0.0–30.2)

## 2012-06-06 NOTE — Telephone Encounter (Signed)
This is to document that the patient was informed recently of the results of her endometrial biopsy that was done as a result of her postmenopausal bleeding on there a 6 2014. The results of the pathology report was as follows and discussed with the patient:  Endometrium, biopsy - POSITIVE FOR ENDOMETRIAL ADENOCARCINOMA. - SEE COMMENT. Microscopic Comment Although type and grade are both best assessed on hysterectomy specimen, the features of the tumor from the endometrial biopsy as sampled are consistent with an endometrial adenocarcinoma, endometrioid-type, FIGO grade I.  Patient was set up for a CT of the abdomen and pelvis with and without contrast as well as a C1 25 blood test and has been set up for an appointment with the GYN oncologist an every 20th 2014 at 1 PM with Dr. Nelly Rout and the [facility].

## 2012-06-08 ENCOUNTER — Ambulatory Visit: Payer: Medicare Other | Attending: Gynecologic Oncology | Admitting: Gynecologic Oncology

## 2012-06-08 ENCOUNTER — Encounter: Payer: Self-pay | Admitting: Gynecologic Oncology

## 2012-06-08 VITALS — BP 186/80 | HR 64 | Temp 97.6°F | Resp 24 | Ht 62.8 in | Wt 258.3 lb

## 2012-06-08 DIAGNOSIS — E119 Type 2 diabetes mellitus without complications: Secondary | ICD-10-CM | POA: Diagnosis not present

## 2012-06-08 DIAGNOSIS — C541 Malignant neoplasm of endometrium: Secondary | ICD-10-CM

## 2012-06-08 DIAGNOSIS — E785 Hyperlipidemia, unspecified: Secondary | ICD-10-CM | POA: Insufficient documentation

## 2012-06-08 DIAGNOSIS — E039 Hypothyroidism, unspecified: Secondary | ICD-10-CM | POA: Diagnosis not present

## 2012-06-08 DIAGNOSIS — Z79899 Other long term (current) drug therapy: Secondary | ICD-10-CM | POA: Diagnosis not present

## 2012-06-08 DIAGNOSIS — C549 Malignant neoplasm of corpus uteri, unspecified: Secondary | ICD-10-CM | POA: Insufficient documentation

## 2012-06-08 DIAGNOSIS — I10 Essential (primary) hypertension: Secondary | ICD-10-CM | POA: Insufficient documentation

## 2012-06-08 NOTE — Progress Notes (Signed)
Consult Note: Gyn-Onc  Consult was requested by Dr. Lily Peer for the evaluation of Valerie Ramirez 79 y.o. female  CC:  Chief Complaint  Patient presents with  . Endometrial cancer    New Consult    HPI: This is a delightful 79 year old gravida 5 para 2 last normal menstrual period in her 67s. For approximately one month prior to 04/15/2012 she noted vaginal spotting. On December 28 the bleeding was very heavy. She then presented to Dr. Lily Peer and a biopsy was collected on 05/25/2012 returned positive for grade 1 endometrial cancer.  She denies any weight loss she reports marked decrease in energy but denies any changes in her bowel or bladder habits.  A CT scan collected on 06/05/2012 was notable for the presence of a 4.2 x 5.3 cm exophytic left upper pole renal cyst there were no findings to suggest metastatic disease in the abdomen or pelvis.    Current Meds:  Outpatient Encounter Prescriptions as of 06/08/2012  Medication Sig Dispense Refill  . amLODipine (NORVASC) 5 MG tablet TAKE ONE BY MOUTH DAILY  30 tablet  0  . aspirin 325 MG tablet Take 325-650 mg by mouth as needed. For pain.      . cyanocobalamin 500 MCG tablet Take by mouth as directed.        . diclofenac (VOLTAREN) 75 MG EC tablet Take 1 tablet by mouth Twice daily.      [name] doxazosin (CARDURA) 8 MG tablet TAKE ONE BY MOUTH DAILY  30 tablet  2  . ECHINACEA PO Take 1 tablet by mouth daily.        [name] lisinopril (PRINIVIL,ZESTRIL) 5 MG tablet TAKE ONE BY MOUTH DAILY  30 tablet  2  . metoprolol (LOPRESSOR) 100 MG tablet TAKE 1 TABLET (100 MG TOTAL) BY MOUTH 2 (TWO) TIMES DAILY.  180 tablet  1   No facility-administered encounter medications on file as of 06/08/2012.    Allergy:  Allergies  Allergen Reactions  . Amoxicillin-Pot Clavulanate     REACTION: stomach upset  . Lipitor (Atorvastatin Calcium) Other (See Comments)    Leg ache and ache all over  . Simvastatin     REACTION: muscle pain    Social Hx:    History   Social History  . Marital Status: Married    Spouse Name: [name]    Number of Children: [name]  . Years of Education: [name]   Occupational History  . Not on file.   Social History Main Topics  . Smoking status: Never Smoker   . Smokeless tobacco: Never Used  . Alcohol Use: No  . Drug Use: No  . Sexually Active: No   Other Topics Concern  . Not on file   Social History Narrative  . No narrative on file    Past Surgical Hx:  Past Surgical History  Procedure Laterality Date  . Cholecystectomy    . Tubal ligation    . Joint replacement  07/1998  . Knee arthroplasty  05/23/1998    total right  . Polyp removal    . Hand surgery  12/2008    after hand fx/due to fall    Past Medical Hx:  Past Medical History  Diagnosis Date  . Hypertension   . Hypothyroid   . Arthritis     OA  . Hyperlipidemia   . Diabetes mellitus     type II    Past Gynecological History:  G5P2 LNMP in 71's No h/o abnormal pap, Last pap  2014.  NSVD x 2.  OCP until 79 years old. BTL in 49's  Family Hx:  Family History  Problem Relation Age of Onset  . Heart disease Sister     CABG  . Stroke Sister   . Heart disease Brother     CABG  . Cancer Brother     LEUKEMIA  . Cancer Brother     LUNG  . Cancer Brother     STOMACH    Review of Systems:  Constitutional  Feels well, decreased energy no weight loss  Cardiovascular  No chest pain, shortness of breath, or edema  Pulmonary  No cough or wheeze.  Gastro Intestinal  No nausea, vomitting, or diarrhoea. No bright red blood per rectum, no abdominal pain, change in bowel movement, or constipation.  Genito Urinary  Stress and urge incontinence for several years , reports vaginal spotting no vaginal discharge denies abdominal cramping. Musculo Skeletal  No myalgia, arthralgia, joint swelling or pain  Neurologic  No weakness, numbness, change in gait,  Psychology  No depression, anxiety, insomnia.   Vitals:  Blood pressure  186/80, pulse 64, temperature 97.6 F (36.4 C), temperature source Oral, resp. rate 24, height 5' 2.8" (1.595 m), weight 258 lb 4.8 oz (117.164 kg).  Physical Exam: WD in NAD, very upbeat and positive Neck  Supple NROM, without any enlargements.  Lymph Node Survey No cervical supraclavicular or inguinal adenopathy Cardiovascular  Pulse normal rate, regularity and rhythm. S1 and S2 normal.  Lungs  Clear to auscultation bilaterally. Skin  No rash/lesions/breakdown  Psychiatry  Alert and oriented to person, place, and time  Abdomen  Normoactive bowel sounds, abdomen soft, non-tender and obese. No ascites or palpable masses. Large cholecystectomy scar. Back No CVA tenderness Genito Urinary  Vulva/vagina: Normal external female genitalia.  No lesions.   Bladder/urethra: Grade 1 cystocele  Vagina: Very long atrophic trace blood noted in the vaginal vault  Cervix: Very difficult to visualize the cervix and spared a palpable however no gross lesions were noted  Uterus: Unable to assess size. The parametrium was too distant for palpation  Adnexa: No palpable masses. Rectal  Good tone, no masses no cul de sac nodularity.  Extremities  No bilateral cyanosis, clubbing or edema.   Assessment/Plan:  [name]  is a 79 y.o.  year old with a FIGO grade 1 endometrial adenocarcinoma.  A detailed discussion was held with the patient and her family with regard to her endometrial cancer diagnosis. We discussed the standard management options for uterine cancer which includes surgery followed possibly by adjuvant therapy depending on the results of surgery. The surgical management includes a hysterectomy and removal of the tubes and ovaries possibly with removal of pelvic and para-aortic lymph nodes.The patient has been counseled about the risks of the procedure which include but are not limited to infection, bleeding, damage to adjacent organs, nerves, vessels and clot formation and she is  willing to proceed.  All of her questions have been answered.   She is aware that the procedure will be performed by Dr. De Blanch on 07/04/2012   Laurette Schimke, MD, PhD 06/08/2012, 1:49 PM

## 2012-06-08 NOTE — Patient Instructions (Addendum)
The surgical management includes a hysterectomy and removal of the tubes and ovaries possibly with removal of pelvic and para-aortic lymph nodes.The  risks of the procedure include but are not limited to infection, bleeding, damage to adjacent organs, nerves, vessels and clot formation. The  procedure will be performed by Dr. De Blanch on 07/04/2012  Thank you very much [name] for allowing me to provide care for you today.  I appreciate your confidence in choosing our Gynecologic Oncology team.  If you have any questions about your visit today please call our office and we will get back to you as soon as possible.  Maryclare Labrador. Takirah Binford MD., PhD Gynecologic Oncology

## 2012-06-15 ENCOUNTER — Other Ambulatory Visit: Payer: Self-pay | Admitting: Family Medicine

## 2012-06-26 ENCOUNTER — Encounter (HOSPITAL_COMMUNITY): Payer: Self-pay | Admitting: Pharmacy Technician

## 2012-06-27 NOTE — Progress Notes (Signed)
NEED PRE OP ORDERS PLEASE-  PST 06/30/12 Thanks

## 2012-06-30 ENCOUNTER — Encounter (HOSPITAL_COMMUNITY)
Admission: RE | Admit: 2012-06-30 | Discharge: 2012-06-30 | Disposition: A | Discharge status: Home / Self Care | Payer: Medicare Other | Source: Ambulatory Visit | Attending: Obstetrics & Gynecology | Admitting: Obstetrics & Gynecology

## 2012-06-30 ENCOUNTER — Encounter (HOSPITAL_COMMUNITY): Payer: Self-pay

## 2012-06-30 ENCOUNTER — Ambulatory Visit (HOSPITAL_COMMUNITY)
Admission: RE | Admit: 2012-06-30 | Discharge: 2012-06-30 | Disposition: A | Discharge status: Home / Self Care | Payer: Medicare Other | Source: Ambulatory Visit | Attending: Gynecology | Admitting: Gynecology

## 2012-06-30 DIAGNOSIS — Z01810 Encounter for preprocedural cardiovascular examination: Secondary | ICD-10-CM | POA: Diagnosis not present

## 2012-06-30 DIAGNOSIS — I10 Essential (primary) hypertension: Secondary | ICD-10-CM | POA: Insufficient documentation

## 2012-06-30 DIAGNOSIS — Z01812 Encounter for preprocedural laboratory examination: Secondary | ICD-10-CM | POA: Insufficient documentation

## 2012-06-30 DIAGNOSIS — Z01818 Encounter for other preprocedural examination: Secondary | ICD-10-CM | POA: Insufficient documentation

## 2012-06-30 DIAGNOSIS — M899 Disorder of bone, unspecified: Secondary | ICD-10-CM | POA: Insufficient documentation

## 2012-06-30 DIAGNOSIS — I77810 Thoracic aortic ectasia: Secondary | ICD-10-CM | POA: Insufficient documentation

## 2012-06-30 DIAGNOSIS — I700 Atherosclerosis of aorta: Secondary | ICD-10-CM | POA: Insufficient documentation

## 2012-06-30 HISTORY — DX: Malignant (primary) neoplasm, unspecified: C80.1

## 2012-06-30 HISTORY — DX: Gastro-esophageal reflux disease without esophagitis: K21.9

## 2012-06-30 LAB — CBC WITH DIFFERENTIAL/PLATELET
Basophils Absolute: 0.1 10*3/uL (ref 0.0–0.1)
Basophils Relative: 1 % (ref 0–1)
Eosinophils Absolute: 0.3 10*3/uL (ref 0.0–0.7)
Eosinophils Relative: 3 % (ref 0–5)
HCT: 39.6 % (ref 36.0–46.0)
Hemoglobin: 12.7 g/dL (ref 12.0–15.0)
Lymphocytes Relative: 29 % (ref 12–46)
Lymphs Abs: 2.6 10*3/uL (ref 0.7–4.0)
MCH: 29.1 pg (ref 26.0–34.0)
MCHC: 32.1 g/dL (ref 30.0–36.0)
MCV: 90.8 fL (ref 78.0–100.0)
Monocytes Absolute: 0.5 10*3/uL (ref 0.1–1.0)
Monocytes Relative: 6 % (ref 3–12)
Neutro Abs: 5.3 10*3/uL (ref 1.7–7.7)
Neutrophils Relative %: 61 % (ref 43–77)
Platelets: 218 10*3/uL (ref 150–400)
RBC: 4.36 MIL/uL (ref 3.87–5.11)
RDW: 14.7 % (ref 11.5–15.5)
WBC: 8.7 10*3/uL (ref 4.0–10.5)

## 2012-06-30 LAB — COMPREHENSIVE METABOLIC PANEL
ALT: 14 U/L (ref 0–35)
AST: 15 U/L (ref 0–37)
Albumin: 3.5 g/dL (ref 3.5–5.2)
Alkaline Phosphatase: 105 U/L (ref 39–117)
BUN: 24 mg/dL — ABNORMAL HIGH (ref 6–23)
CO2: 25 mEq/L (ref 19–32)
Calcium: 9.3 mg/dL (ref 8.4–10.5)
Chloride: 104 mEq/L (ref 96–112)
Creatinine, Ser: 1.1 mg/dL (ref 0.50–1.10)
GFR calc Af Amer: 55 mL/min — ABNORMAL LOW (ref >90)
GFR calc non Af Amer: 47 mL/min — ABNORMAL LOW (ref >90)
Glucose, Bld: 98 mg/dL (ref 70–99)
Potassium: 4.1 mEq/L (ref 3.5–5.1)
Sodium: 139 mEq/L (ref 135–145)
Total Bilirubin: 0.3 mg/dL (ref 0.3–1.2)
Total Protein: 7.4 g/dL (ref 6.0–8.3)

## 2012-06-30 LAB — ABO/RH: ABO/RH(D): A NEG

## 2012-06-30 LAB — SURGICAL PCR SCREEN
MRSA, PCR: NEGATIVE
Staphylococcus aureus: NEGATIVE

## 2012-06-30 NOTE — Progress Notes (Signed)
06/30/12 1101  OBSTRUCTIVE SLEEP APNEA  Have you ever been diagnosed with sleep apnea through a sleep study? No  Do you snore loudly (loud enough to be heard through closed doors)?  0  Do you often feel tired, fatigued, or sleepy during the daytime? 1  Has anyone observed you stop breathing during your sleep? 0  Do you have, or are you being treated for high blood pressure? 1  BMI more than 35 kg/m2? 1  Age over 77 years old? 1  Neck circumference greater than 40 cm/18 inches? 0  Gender: 0  Obstructive Sleep Apnea Score 4  Score 4 or greater  Results sent to PCP

## 2012-06-30 NOTE — Patient Instructions (Signed)
22 Valerie Ramirez  06/30/2012   Your procedure is scheduled on: 07/04/12  Report to [facility] at [phone].  Call this number if you have problems the morning of surgery [phone]   Remember: please complete bowel prep   Do not eat food or drink liquids After Midnight.     Take these medicines the morning of surgery with A SIP OF WATER: metoprolol, amlodipine   Do not wear jewelry, make-up or nail polish.  Do not wear lotions, powders, or perfumes. You may wear deodorant.  Do not shave 48 hours prior to surgery. Men may shave face and neck.  Do not bring valuables to the hospital.  Contacts, dentures or bridgework may not be worn into surgery.  Leave suitcase in the car. After surgery it may be brought to your room.  For patients admitted to the hospital, checkout time is 11:00 AM the day of discharge.    Please read over the following fact sheets that you were given: MRSA Information, incentive spirometer fact sheet, blood fact sheet Birdie Sons, RN  pre op nurse call if needed [phone]    FAILURE TO FOLLOW THESE INSTRUCTIONS MAY RESULT IN CANCELLATION OF YOUR SURGERY   Patient Signature: ___________________________________________

## 2012-07-04 ENCOUNTER — Encounter (HOSPITAL_COMMUNITY)
Admission: RE | Disposition: A | Discharge status: Home / Self Care | Payer: Self-pay | Source: Ambulatory Visit | Attending: Obstetrics & Gynecology

## 2012-07-04 ENCOUNTER — Inpatient Hospital Stay (HOSPITAL_COMMUNITY): Payer: Medicare Other | Admitting: Anesthesiology

## 2012-07-04 ENCOUNTER — Encounter (HOSPITAL_COMMUNITY): Payer: Self-pay | Admitting: General Practice

## 2012-07-04 ENCOUNTER — Inpatient Hospital Stay (HOSPITAL_COMMUNITY)
Admission: RE | Admit: 2012-07-04 | Discharge: 2012-07-11 | DRG: 740 | Disposition: A | Discharge status: Home / Self Care | Payer: Medicare Other | Source: Ambulatory Visit | Attending: Obstetrics & Gynecology | Admitting: Obstetrics & Gynecology

## 2012-07-04 ENCOUNTER — Encounter (HOSPITAL_COMMUNITY): Payer: Self-pay | Admitting: Anesthesiology

## 2012-07-04 DIAGNOSIS — Z7982 Long term (current) use of aspirin: Secondary | ICD-10-CM | POA: Diagnosis not present

## 2012-07-04 DIAGNOSIS — Z6841 Body Mass Index (BMI) 40.0 and over, adult: Secondary | ICD-10-CM | POA: Diagnosis not present

## 2012-07-04 DIAGNOSIS — Z8542 Personal history of malignant neoplasm of other parts of uterus: Secondary | ICD-10-CM | POA: Diagnosis present

## 2012-07-04 DIAGNOSIS — K660 Peritoneal adhesions (postprocedural) (postinfection): Secondary | ICD-10-CM | POA: Diagnosis not present

## 2012-07-04 DIAGNOSIS — C549 Malignant neoplasm of corpus uteri, unspecified: Secondary | ICD-10-CM | POA: Diagnosis not present

## 2012-07-04 DIAGNOSIS — E039 Hypothyroidism, unspecified: Secondary | ICD-10-CM | POA: Diagnosis present

## 2012-07-04 DIAGNOSIS — E119 Type 2 diabetes mellitus without complications: Secondary | ICD-10-CM | POA: Diagnosis present

## 2012-07-04 DIAGNOSIS — I700 Atherosclerosis of aorta: Secondary | ICD-10-CM | POA: Diagnosis not present

## 2012-07-04 DIAGNOSIS — C541 Malignant neoplasm of endometrium: Secondary | ICD-10-CM

## 2012-07-04 DIAGNOSIS — K560 Paralytic ileus: Secondary | ICD-10-CM | POA: Diagnosis not present

## 2012-07-04 DIAGNOSIS — Z79899 Other long term (current) drug therapy: Secondary | ICD-10-CM

## 2012-07-04 DIAGNOSIS — I10 Essential (primary) hypertension: Secondary | ICD-10-CM | POA: Diagnosis present

## 2012-07-04 DIAGNOSIS — J9819 Other pulmonary collapse: Secondary | ICD-10-CM | POA: Diagnosis not present

## 2012-07-04 DIAGNOSIS — K922 Gastrointestinal hemorrhage, unspecified: Secondary | ICD-10-CM | POA: Diagnosis not present

## 2012-07-04 DIAGNOSIS — J90 Pleural effusion, not elsewhere classified: Secondary | ICD-10-CM | POA: Diagnosis not present

## 2012-07-04 HISTORY — PX: BOWEL RESECTION: SHX1257

## 2012-07-04 HISTORY — PX: LAPAROTOMY: SHX154

## 2012-07-04 LAB — TYPE AND SCREEN
ABO/RH(D): A NEG
Antibody Screen: NEGATIVE

## 2012-07-04 SURGERY — LAPAROTOMY, EXPLORATORY
Anesthesia: General | Site: Abdomen | Laterality: Bilateral | Wound class: Clean Contaminated

## 2012-07-04 MED ORDER — HYDROMORPHONE HCL PF 1 MG/ML IJ SOLN
INTRAMUSCULAR | Status: AC
  Filled 2012-07-04: qty 1

## 2012-07-04 MED ORDER — ENOXAPARIN SODIUM 40 MG/0.4ML ~~LOC~~ SOLN
40.0000 mg | SUBCUTANEOUS | Status: AC
  Administered 2012-07-04: 40 mg via SUBCUTANEOUS
  Filled 2012-07-04: qty 0.4

## 2012-07-04 MED ORDER — AMLODIPINE BESYLATE 5 MG PO TABS
5.0000 mg | ORAL_TABLET | Freq: Every morning | ORAL | Status: DC
  Administered 2012-07-05 – 2012-07-11 (×7): 5 mg via ORAL
  Filled 2012-07-04 (×7): qty 1

## 2012-07-04 MED ORDER — HYDROMORPHONE 0.3 MG/ML IV SOLN
INTRAVENOUS | Status: AC
  Filled 2012-07-04: qty 25

## 2012-07-04 MED ORDER — LACTATED RINGERS IV SOLN: INTRAVENOUS | Status: DC

## 2012-07-04 MED ORDER — HYDROMORPHONE 0.3 MG/ML IV SOLN
INTRAVENOUS | Status: DC
  Administered 2012-07-04: 12:00:00 via INTRAVENOUS
  Administered 2012-07-04: 0.399 mg via INTRAVENOUS
  Administered 2012-07-04: 0.2 mg via INTRAVENOUS
  Administered 2012-07-05: 0.4 mg via INTRAVENOUS
  Administered 2012-07-05: 1.39 mg via INTRAVENOUS
  Administered 2012-07-05: 0.799 mg via INTRAVENOUS
  Administered 2012-07-05: 0.2 mg via INTRAVENOUS
  Administered 2012-07-05: 0.399 mg via INTRAVENOUS
  Administered 2012-07-05 – 2012-07-06 (×2): 0.4 mg via INTRAVENOUS
  Administered 2012-07-06: 09:00:00 via INTRAVENOUS
  Administered 2012-07-06: 0.199 mg via INTRAVENOUS
  Administered 2012-07-06: 0.4 mg via INTRAVENOUS
  Administered 2012-07-06: 0.799 mg via INTRAVENOUS
  Administered 2012-07-06 – 2012-07-07 (×2): 0.6 mg via INTRAVENOUS
  Administered 2012-07-07: 0.4 mg via INTRAVENOUS
  Administered 2012-07-07: 0.2 mg via INTRAVENOUS
  Filled 2012-07-04: qty 25

## 2012-07-04 MED ORDER — ONDANSETRON HCL 4 MG PO TABS
4.0000 mg | ORAL_TABLET | Freq: Four times a day (QID) | ORAL | Status: DC | PRN
  Filled 2012-07-04: qty 1

## 2012-07-04 MED ORDER — ACETAMINOPHEN 10 MG/ML IV SOLN
INTRAVENOUS | Status: DC | PRN
  Administered 2012-07-04: 1000 mg via INTRAVENOUS

## 2012-07-04 MED ORDER — 0.9 % SODIUM CHLORIDE (POUR BTL) OPTIME
TOPICAL | Status: DC | PRN
  Administered 2012-07-04: 3000 mL

## 2012-07-04 MED ORDER — HYDROMORPHONE HCL PF 1 MG/ML IJ SOLN
INTRAMUSCULAR | Status: DC | PRN
  Administered 2012-07-04: .4 mg via INTRAVENOUS
  Administered 2012-07-04: .5 mg via INTRAVENOUS
  Administered 2012-07-04: .6 mg via INTRAVENOUS

## 2012-07-04 MED ORDER — LACTATED RINGERS IV SOLN
INTRAVENOUS | Status: DC | PRN
  Administered 2012-07-04 (×2): via INTRAVENOUS

## 2012-07-04 MED ORDER — DOXAZOSIN MESYLATE 8 MG PO TABS
8.0000 mg | ORAL_TABLET | Freq: Every day | ORAL | Status: DC
  Administered 2012-07-04 – 2012-07-10 (×7): 8 mg via ORAL
  Filled 2012-07-04 (×8): qty 1

## 2012-07-04 MED ORDER — BUPIVACAINE LIPOSOME 1.3 % IJ SUSP
20.0000 mL | Freq: Once | INTRAMUSCULAR | Status: DC
  Filled 2012-07-04: qty 20

## 2012-07-04 MED ORDER — LISINOPRIL 5 MG PO TABS
5.0000 mg | ORAL_TABLET | Freq: Every day | ORAL | Status: DC
  Administered 2012-07-04 – 2012-07-10 (×7): 5 mg via ORAL
  Filled 2012-07-04 (×8): qty 1

## 2012-07-04 MED ORDER — METOPROLOL TARTRATE 100 MG PO TABS
100.0000 mg | ORAL_TABLET | Freq: Two times a day (BID) | ORAL | Status: DC
  Administered 2012-07-04 – 2012-07-11 (×14): 100 mg via ORAL
  Filled 2012-07-04 (×15): qty 1

## 2012-07-04 MED ORDER — DIPHENHYDRAMINE HCL 50 MG/ML IJ SOLN: 12.5000 mg | Freq: Four times a day (QID) | INTRAMUSCULAR | Status: DC | PRN

## 2012-07-04 MED ORDER — ONDANSETRON HCL 4 MG/2ML IJ SOLN
4.0000 mg | Freq: Four times a day (QID) | INTRAMUSCULAR | Status: DC | PRN
  Administered 2012-07-06: 4 mg via INTRAVENOUS
  Filled 2012-07-04: qty 2

## 2012-07-04 MED ORDER — HYDROMORPHONE HCL PF 1 MG/ML IJ SOLN
0.2500 mg | INTRAMUSCULAR | Status: DC | PRN
  Administered 2012-07-04 (×2): 0.5 mg via INTRAVENOUS

## 2012-07-04 MED ORDER — FENTANYL CITRATE 0.05 MG/ML IJ SOLN
INTRAMUSCULAR | Status: DC | PRN
  Administered 2012-07-04 (×3): 50 ug via INTRAVENOUS
  Administered 2012-07-04: 100 ug via INTRAVENOUS

## 2012-07-04 MED ORDER — CIPROFLOXACIN IN D5W 400 MG/200ML IV SOLN
400.0000 mg | INTRAVENOUS | Status: AC
  Administered 2012-07-04: 400 mg via INTRAVENOUS

## 2012-07-04 MED ORDER — KCL IN DEXTROSE-NACL 20-5-0.45 MEQ/L-%-% IV SOLN
INTRAVENOUS | Status: DC
  Administered 2012-07-04 – 2012-07-05 (×3): via INTRAVENOUS
  Filled 2012-07-04 (×4): qty 1000

## 2012-07-04 MED ORDER — GLYCOPYRROLATE 0.2 MG/ML IJ SOLN
INTRAMUSCULAR | Status: DC | PRN
  Administered 2012-07-04: .8 mg via INTRAVENOUS

## 2012-07-04 MED ORDER — PHENYLEPHRINE HCL 10 MG/ML IJ SOLN
INTRAMUSCULAR | Status: DC | PRN
  Administered 2012-07-04: 40 ug via INTRAVENOUS

## 2012-07-04 MED ORDER — NALOXONE HCL 0.4 MG/ML IJ SOLN: 0.4000 mg | INTRAMUSCULAR | Status: DC | PRN

## 2012-07-04 MED ORDER — ENOXAPARIN SODIUM 40 MG/0.4ML ~~LOC~~ SOLN
40.0000 mg | SUBCUTANEOUS | Status: DC
  Administered 2012-07-05 – 2012-07-11 (×7): 40 mg via SUBCUTANEOUS
  Filled 2012-07-04 (×7): qty 0.4

## 2012-07-04 MED ORDER — ZOLPIDEM TARTRATE 5 MG PO TABS: 5.0000 mg | ORAL_TABLET | Freq: Every evening | ORAL | Status: DC | PRN

## 2012-07-04 MED ORDER — SODIUM CHLORIDE 0.9 % IJ SOLN: 9.0000 mL | INTRAMUSCULAR | Status: DC | PRN

## 2012-07-04 MED ORDER — BUPIVACAINE LIPOSOME 1.3 % IJ SUSP
INTRAMUSCULAR | Status: DC | PRN
  Administered 2012-07-04: 11:00:00

## 2012-07-04 MED ORDER — ACETAMINOPHEN 10 MG/ML IV SOLN
1000.0000 mg | Freq: Four times a day (QID) | INTRAVENOUS | Status: AC
  Administered 2012-07-04 (×2): 1000 mg via INTRAVENOUS
  Filled 2012-07-04 (×3): qty 100

## 2012-07-04 MED ORDER — HEPARIN SODIUM (PORCINE) 1000 UNIT/ML IJ SOLN
INTRAMUSCULAR | Status: AC
  Filled 2012-07-04: qty 1

## 2012-07-04 MED ORDER — OXYCODONE-ACETAMINOPHEN 5-325 MG PO TABS
1.0000 | ORAL_TABLET | ORAL | Status: DC | PRN
  Administered 2012-07-09: 1 via ORAL
  Filled 2012-07-04: qty 1

## 2012-07-04 MED ORDER — DIPHENHYDRAMINE HCL 12.5 MG/5ML PO ELIX
12.5000 mg | ORAL_SOLUTION | Freq: Four times a day (QID) | ORAL | Status: DC | PRN
  Filled 2012-07-04: qty 5

## 2012-07-04 MED ORDER — ROCURONIUM BROMIDE 100 MG/10ML IV SOLN
INTRAVENOUS | Status: DC | PRN
  Administered 2012-07-04: 5 mg via INTRAVENOUS
  Administered 2012-07-04: 40 mg via INTRAVENOUS
  Administered 2012-07-04 (×2): 10 mg via INTRAVENOUS

## 2012-07-04 MED ORDER — PROPOFOL 10 MG/ML IV BOLUS
INTRAVENOUS | Status: DC | PRN
  Administered 2012-07-04: 180 mg via INTRAVENOUS

## 2012-07-04 MED ORDER — LIDOCAINE HCL (CARDIAC) 20 MG/ML IV SOLN
INTRAVENOUS | Status: DC | PRN
  Administered 2012-07-04: 50 mg via INTRAVENOUS

## 2012-07-04 MED ORDER — NEOSTIGMINE METHYLSULFATE 1 MG/ML IJ SOLN
INTRAMUSCULAR | Status: DC | PRN
  Administered 2012-07-04: 4 mg via INTRAVENOUS

## 2012-07-04 MED ORDER — LACTATED RINGERS IV SOLN
INTRAVENOUS | Status: DC
  Administered 2012-07-04: 1000 mL via INTRAVENOUS

## 2012-07-04 MED ORDER — METRONIDAZOLE IN NACL 5-0.79 MG/ML-% IV SOLN
500.0000 mg | INTRAVENOUS | Status: AC
  Administered 2012-07-04: 500 mg via INTRAVENOUS

## 2012-07-04 MED ORDER — ONDANSETRON HCL 4 MG/2ML IJ SOLN
INTRAMUSCULAR | Status: DC | PRN
  Administered 2012-07-04: 3 mg via INTRAVENOUS
  Administered 2012-07-04: 1 mg via INTRAVENOUS

## 2012-07-04 SURGICAL SUPPLY — 58 items
ATTRACTOMAT 16X20 MAGNETIC DRP (DRAPES) ×3 IMPLANT
BAG URINE DRAINAGE (UROLOGICAL SUPPLIES) ×2 IMPLANT
CANISTER SUCTION 2500CC (MISCELLANEOUS) ×3 IMPLANT
CHLORAPREP W/TINT 26ML (MISCELLANEOUS) ×4 IMPLANT
CLIP TI MEDIUM 6 (CLIP) ×1 IMPLANT
CLIP TI MEDIUM LARGE 6 (CLIP) ×13 IMPLANT
CLOTH BEACON ORANGE TIMEOUT ST (SAFETY) ×3 IMPLANT
COVER SURGICAL LIGHT HANDLE (MISCELLANEOUS) ×3 IMPLANT
DRAIN CHANNEL 10F 3/8 F FF (DRAIN) ×1 IMPLANT
DRAPE UTILITY XL STRL (DRAPES) ×3 IMPLANT
DRAPE WARM FLUID 44X44 (DRAPE) ×3 IMPLANT
DRSG TELFA 4X14 ISLAND ADH (GAUZE/BANDAGES/DRESSINGS) ×1 IMPLANT
ELECT BLADE 6.5 EXT (BLADE) ×3 IMPLANT
ELECT REM PT RETURN 9FT ADLT (ELECTROSURGICAL) ×3
ELECTRODE REM PT RTRN 9FT ADLT (ELECTROSURGICAL) ×2 IMPLANT
EVACUATOR SILICONE 100CC (DRAIN) ×1 IMPLANT
GAUZE SPONGE 2X2 8PLY STRL LF (GAUZE/BANDAGES/DRESSINGS) IMPLANT
GAUZE SPONGE 4X4 16PLY XRAY LF (GAUZE/BANDAGES/DRESSINGS) ×3 IMPLANT
GAUZE XEROFORM 2X2 STRL (GAUZE/BANDAGES/DRESSINGS) ×1 IMPLANT
GLOVE BIOGEL M STRL SZ7.5 (GLOVE) ×15 IMPLANT
GOWN PREVENTION PLUS LG XLONG (DISPOSABLE) ×2 IMPLANT
GOWN STRL NON-REIN LRG LVL3 (GOWN DISPOSABLE) ×4 IMPLANT
GOWN STRL REIN XL XLG (GOWN DISPOSABLE) ×3 IMPLANT
LIGASURE IMPACT 36 18CM CVD LR (INSTRUMENTS) IMPLANT
NEEDLE HYPO 22GX1.5 SAFETY (NEEDLE) ×1 IMPLANT
NS IRRIG 1000ML POUR BTL (IV SOLUTION) ×8 IMPLANT
PACK ABDOMINAL WL (CUSTOM PROCEDURE TRAY) ×3 IMPLANT
RELOAD LINEAR CUT PROX 55 BLUE (ENDOMECHANICALS) ×6 IMPLANT
RELOAD STAPLE 55 3.8 BLU REG (ENDOMECHANICALS) IMPLANT
SHEET LAVH (DRAPES) ×3 IMPLANT
SPONGE GAUZE 2X2 STER 10/PKG (GAUZE/BANDAGES/DRESSINGS) ×1
SPONGE LAP 18X18 X RAY DECT (DISPOSABLE) ×7 IMPLANT
STAPLER GUN LINEAR PROX 60 (STAPLE) ×1 IMPLANT
STAPLER PROXIMATE 55 BLUE (STAPLE) ×1 IMPLANT
STAPLER SKIN PROX WIDE 3.9 (STAPLE) ×3 IMPLANT
STAPLER VISISTAT 35W (STAPLE) ×1 IMPLANT
SUT ETHILON 1 LR 30 (SUTURE) IMPLANT
SUT PDS AB 1 CTXB1 36 (SUTURE) ×6 IMPLANT
SUT SILK 2 0 (SUTURE) ×3
SUT SILK 2 0 30  PSL (SUTURE) ×1
SUT SILK 2 0 30 PSL (SUTURE) IMPLANT
SUT SILK 2-0 18XBRD TIE 12 (SUTURE) ×2 IMPLANT
SUT SILK 3 0 SH CR/8 (SUTURE) ×1 IMPLANT
SUT VIC AB 0 CT1 27 (SUTURE) ×6
SUT VIC AB 0 CT1 27XBRD ANTBC (SUTURE) IMPLANT
SUT VIC AB 0 CT1 36 (SUTURE) ×12 IMPLANT
SUT VIC AB 2-0 CT2 27 (SUTURE) ×23 IMPLANT
SUT VIC AB 2-0 SH 27 (SUTURE) ×21
SUT VIC AB 2-0 SH 27X BRD (SUTURE) ×12 IMPLANT
SUT VIC AB 3-0 CTX 36 (SUTURE) ×3 IMPLANT
SUT VICRYL 2 0 18  UND BR (SUTURE) ×1
SUT VICRYL 2 0 18 UND BR (SUTURE) ×2 IMPLANT
SYR 20CC LL (SYRINGE) ×1 IMPLANT
TAPE CLOTH SURG 4X10 WHT LF (GAUZE/BANDAGES/DRESSINGS) ×1 IMPLANT
TOWEL OR 17X26 10 PK STRL BLUE (TOWEL DISPOSABLE) ×3 IMPLANT
TOWEL OR NON WOVEN STRL DISP B (DISPOSABLE) ×3 IMPLANT
TRAY FOLEY CATH 14FRSI W/METER (CATHETERS) ×3 IMPLANT
WATER STERILE IRR 1500ML POUR (IV SOLUTION) ×2 IMPLANT

## 2012-07-04 NOTE — Anesthesia Postprocedure Evaluation (Signed)
  Anesthesia Post-op Note  Patient: Valerie Ramirez  Procedure(s) Performed: Procedure(s) (LRB): EXPLORATORY LAPAROTOMY TOTAL ABDOMINAL HYSTERECTOMY BILATERAL SALPINGO-OOPHORECTOMY with small bowel resection  (Bilateral) SMALL BOWEL RESECTION  Patient Location: PACU  Anesthesia Type: General  Level of Consciousness: awake and alert   Airway and Oxygen Therapy: Patient Spontanous Breathing  Post-op Pain: mild  Post-op Assessment: Post-op Vital signs reviewed, Patient's Cardiovascular Status Stable, Respiratory Function Stable, Patent Airway and No signs of Nausea or vomiting  Last Vitals:  Filed Vitals:   07/04/12 1230  BP: 125/54  Pulse: 56  Temp:   Resp: 12    Post-op Vital Signs: stable   Complications: No apparent anesthesia complications

## 2012-07-04 NOTE — Progress Notes (Signed)
Utilization review completed.  

## 2012-07-04 NOTE — Op Note (Signed)
Valerie Ramirez  female MEDICAL RECORD [id number] DATE OF BIRTH: 10-11-1932 PHYSICIAN: De Blanch, M.D  07/04/2012   OPERATIVE REPORT  PREOPERATIVE DIAGNOSIS:   POSTOPERATIVE DIAGNOSIS:  PROCEDURE:   SURGEON: De Blanch, M.D ASSISTANT: ANESTHESIA:  ESTIMATED BLOOD LOSS:  SURGICAL FINDINGS:  PROCEDURE: The patient was brought to the operating room and after satisfactory attainment of general anesthesia was placed in a modified lithotomy position in [place] stirrups. The anterior bowel wall perineum and vagina were prepped, Foley catheter was inserted, the patient was draped.   De Blanch, M.DHelen Mendel Ramirez  female MEDICAL RECORD [id number] DATE OF BIRTH: [birth year] PHYSICIAN: De Blanch, M.D  07/04/2012   OPERATIVE REPORT  PREOPERATIVE DIAGNOSIS: Grade 1 endometrial carcinoma  POSTOPERATIVE DIAGNOSIS: Grade 1 endometrial carcinoma, extensive adhesions  PROCEDURE: Extensive lysis of adhesions, total abdominal hysterectomy, bilateral salpingo-oophorectomy, small bowel resection with anastomosis.  SURGEON: De Blanch, M.D ASSISTANT: Antionette Char M.D., Telford Nab RN ANESTHESIA: Gen. orotracheal tube ESTIMATED BLOOD LOSS: 600 mL  SURGICAL FINDINGS: Upon opening the abdomen there were extensive adhesions to the parietal peritoneum of the small bowel and omentum. There were also multiple small bowel the small bowel adhesions. Sigmoid colon was densely adherent to the left pelvic sidewall. The bladder flap was adherent to the mid uterine fundus. In the course procedure unavoidable series of serotomies occurred this segment of small bowel. It was felt best to resect this segment. Uterus had a small fibroid. Tubes and ovaries appeared normal except for adhesions. Given the patient's morbid obesity and difficulty with exposure we elected to not perform pelvic or para-aortic lymphadenectomy even if she had high-risk  pathologic features. Therefore frozen section was obtained.  PROCEDURE: The patient was brought to the operating room and after satisfactory attainment of general anesthesia was placed in a modified lithotomy position in [place] stirrups. The anterior bowel wall perineum and vagina were prepped, Foley catheter was inserted, the patient was draped. Surgical timeout was taken. The abdomen was entered through a vertical incision. Upon immediately opening the abdomen extensive adhesions were encountered. Lysis of adhesions of the small bowel and omentum required approximately 45 minutes of operating time. Once the small bowel was mobilized and the sigmoid colon mobilized from the left pelvic sidewall [place]ter retractor was assembled and using deep retractor blades the small bowel and sigmoid colon were elevated out of the pelvis thus exposing the uterus. Pelvis very deep and Kelly clamps cannot be placed on the uterus. Figure-of-eight suture of 0 Vicryl was placed in the uterine fundus for traction. The right round ligament was divided the retroperitoneal spaces opened. The iliac vessels and ureter were identified. The ovarian vessels were skeletonized clamped cut free tied twice. Similar procedures performed left side the pelvis. The bladder flap was advanced to sharp and blunt dissection. Uterine vessels were skeletonized then clamped cut and suture ligated. Vaginal angles encountered which was crossclamped and the vagina transected from its connection to the cervix. The uterus cervix tubes and ovaries were handed off the operative field. The vaginal angles were transfixed with 0 Vicryl the central portion of vagina closed with interrupted figure-of-eight sutures of 0 Vicryl. The pelvis was irrigated and hemostasis achieved with cautery and hemoclips.  The retractor was taken down and the small bowel run from the ligament of Treitz. Two serotomies and the small bowel were oversewn with 3-0 silk. A portion of small  bowel that had severe adhesions and serotomies was identified. This segment was resected using a GIA stapler. The mesentery was  crossclamped divided and suture ligated using 2-0 Vicryl. The side-to-side anastomosis was then created using a GIA stapler and the enterotomy closed with a TA stapler. The mesenteric defect was closed interrupted figure-of-eight sutures of 2-0 Vicryl. Additional hemostasis along the staple line was achieved with interrupted 3-0 silk sutures. The abdomen and pelvis were irrigated.  The anterior bowel wall was closed in layers the first being a running mass closure using #1 PDS. Subcutaneous tissue was irrigated and hemostasis achieved with cautery. A 10 flat Jackson-Pratt drain was placed above the fascia exit through stab incision in the left upper quadrant. Drain was sutured to the skin using 2-0 silk. Subcutaneous tissue was then reapproximated with 3-0 Vicryl sutures in 2 layers. Skin was closed with skin staples. A dressing was applied.  Patient was awakened from anesthesia and taken to the recovery room in satisfactory condition. Sponge needle and isthmic counts correct times.   De Blanch, M.D

## 2012-07-04 NOTE — Transfer of Care (Signed)
Immediate Anesthesia Transfer of Care Note  Patient: Valerie Ramirez  Procedure(s) Performed: Procedure(s) (LRB): EXPLORATORY LAPAROTOMY TOTAL ABDOMINAL HYSTERECTOMY BILATERAL SALPINGO-OOPHORECTOMY with small bowel resection  (Bilateral) SMALL BOWEL RESECTION  Patient Location: PACU  Anesthesia Type: General  Level of Consciousness: sedated, patient cooperative and responds to stimulaton  Airway & Oxygen Therapy: Patient Spontanous Breathing and Patient connected to face mask oxgen  Post-op Assessment: Report given to PACU RN and Post -op Vital signs reviewed and stable  Post vital signs: Reviewed and stable  Complications: No apparent anesthesia complications

## 2012-07-04 NOTE — Interval H&P Note (Signed)
History and Physical Interval Note:  07/04/2012 8:16 AM  Valerie Ramirez  has presented today for surgery, with the diagnosis of ENDOMETRIAL CANCER   The various methods of treatment have been discussed with the patient and family. After consideration of risks, benefits and other options for treatment, the patient has consented to  Procedure(s): EXPLORATORY LAPAROTOMY TOTAL ABDOMINAL HYSTERECTOMY BILATERAL SALPINGO-OOPHORECTOMY POSSIBLE LYMPH NODE  (Bilateral) as a surgical intervention .  The patient's history has been reviewed, patient examined, no change in status, stable for surgery.  I have reviewed the patient's chart and labs.  Questions were answered to the patient's satisfaction.     CLARKE-PEARSON,Barbar Brede L

## 2012-07-04 NOTE — Care Management Note (Addendum)
    Page 1 of 1   07/10/2012     9:59:11 AM   CARE MANAGEMENT NOTE 07/10/2012  Patient:  Valerie Ramirez, Valerie Ramirez   Account Number:  [id number]  Date Initiated:  07/04/2012  Documentation initiated by:  Lorenda Ishihara  Subjective/Objective Assessment:   79 yo female admitted s/p lysis of adhesions, TAH, BSO, SBR. PTA lived at home with spouse.     Action/Plan:   Home when stable   Anticipated DC Date:  07/09/2012   Anticipated DC Plan:  HOME/SELF CARE      DC Planning Services  CM consult      Choice offered to / List presented to:             Status of service:  Completed, signed off Medicare Important Message given?   (If response is "NO", the following Medicare IM given date fields will be blank) Date Medicare IM given:   Date Additional Medicare IM given:    Discharge Disposition:  HOME/SELF CARE  Per UR Regulation:  Reviewed for med. necessity/level of care/duration of stay  If discussed at Long Length of Stay Meetings, dates discussed:    Comments:  07/07/12 KATHY MAHABIR RN,BSN NCM [phone] POD#3 TAH/BSO,SLOWLY RESOLVING,CLEARS,DILAUDID IV PCA.CONTINUE TO MONITOR PROGRESS.D/C PLAN HOME.

## 2012-07-04 NOTE — H&P (View-Only) (Signed)
Consult Note: Gyn-Onc  Consult was requested by Dr. Lily Peer for the evaluation of Valerie Ramirez 79 y.o. female  CC:  Chief Complaint  Patient presents with  . Endometrial cancer    New Consult    HPI: This is a delightful 79 year old gravida 5 para 2 last normal menstrual period in her 5s. For approximately one month prior to 04/15/2012 she noted vaginal spotting. On December 28 the bleeding was very heavy. She then presented to Dr. Lily Peer and a biopsy was collected on 05/25/2012 returned positive for grade 1 endometrial cancer.  She denies any weight loss she reports marked decrease in energy but denies any changes in her bowel or bladder habits.  A CT scan collected on 06/05/2012 was notable for the presence of a 4.2 x 5.3 cm exophytic left upper pole renal cyst there were no findings to suggest metastatic disease in the abdomen or pelvis.    Current Meds:  Outpatient Encounter Prescriptions as of 06/08/2012  Medication Sig Dispense Refill  . amLODipine (NORVASC) 5 MG tablet TAKE ONE BY MOUTH DAILY  30 tablet  0  . aspirin 325 MG tablet Take 325-650 mg by mouth as needed. For pain.      . cyanocobalamin 500 MCG tablet Take by mouth as directed.        . diclofenac (VOLTAREN) 75 MG EC tablet Take 1 tablet by mouth Twice daily.      [name] doxazosin (CARDURA) 8 MG tablet TAKE ONE BY MOUTH DAILY  30 tablet  2  . ECHINACEA PO Take 1 tablet by mouth daily.        [name] lisinopril (PRINIVIL,ZESTRIL) 5 MG tablet TAKE ONE BY MOUTH DAILY  30 tablet  2  . metoprolol (LOPRESSOR) 100 MG tablet TAKE 1 TABLET (100 MG TOTAL) BY MOUTH 2 (TWO) TIMES DAILY.  180 tablet  1   No facility-administered encounter medications on file as of 06/08/2012.    Allergy:  Allergies  Allergen Reactions  . Amoxicillin-Pot Clavulanate     REACTION: stomach upset  . Lipitor (Atorvastatin Calcium) Other (See Comments)    Leg ache and ache all over  . Simvastatin     REACTION: muscle pain    Social Hx:    History   Social History  . Marital Status: Married    Spouse Name: [name]    Number of Children: [name]  . Years of Education: [name]   Occupational History  . Not on file.   Social History Main Topics  . Smoking status: Never Smoker   . Smokeless tobacco: Never Used  . Alcohol Use: No  . Drug Use: No  . Sexually Active: No   Other Topics Concern  . Not on file   Social History Narrative  . No narrative on file    Past Surgical Hx:  Past Surgical History  Procedure Laterality Date  . Cholecystectomy    . Tubal ligation    . Joint replacement  07/1998  . Knee arthroplasty  05/23/1998    total right  . Polyp removal    . Hand surgery  12/2008    after hand fx/due to fall    Past Medical Hx:  Past Medical History  Diagnosis Date  . Hypertension   . Hypothyroid   . Arthritis     OA  . Hyperlipidemia   . Diabetes mellitus     type II    Past Gynecological History:  G5P2 LNMP in 81's No h/o abnormal pap, Last pap  2014.  NSVD x 2.  OCP until 79 years old. BTL in 45's  Family Hx:  Family History  Problem Relation Age of Onset  . Heart disease Sister     CABG  . Stroke Sister   . Heart disease Brother     CABG  . Cancer Brother     LEUKEMIA  . Cancer Brother     LUNG  . Cancer Brother     STOMACH    Review of Systems:  Constitutional  Feels well, decreased energy no weight loss  Cardiovascular  No chest pain, shortness of breath, or edema  Pulmonary  No cough or wheeze.  Gastro Intestinal  No nausea, vomitting, or diarrhoea. No bright red blood per rectum, no abdominal pain, change in bowel movement, or constipation.  Genito Urinary  Stress and urge incontinence for several years , reports vaginal spotting no vaginal discharge denies abdominal cramping. Musculo Skeletal  No myalgia, arthralgia, joint swelling or pain  Neurologic  No weakness, numbness, change in gait,  Psychology  No depression, anxiety, insomnia.   Vitals:  Blood pressure  186/80, pulse 64, temperature 97.6 F (36.4 C), temperature source Oral, resp. rate 24, height 5' 2.8" (1.595 m), weight 258 lb 4.8 oz (117.164 kg).  Physical Exam: WD in NAD, very upbeat and positive Neck  Supple NROM, without any enlargements.  Lymph Node Survey No cervical supraclavicular or inguinal adenopathy Cardiovascular  Pulse normal rate, regularity and rhythm. S1 and S2 normal.  Lungs  Clear to auscultation bilaterally. Skin  No rash/lesions/breakdown  Psychiatry  Alert and oriented to person, place, and time  Abdomen  Normoactive bowel sounds, abdomen soft, non-tender and obese. No ascites or palpable masses. Large cholecystectomy scar. Back No CVA tenderness Genito Urinary  Vulva/vagina: Normal external female genitalia.  No lesions.   Bladder/urethra: Grade 1 cystocele  Vagina: Very long atrophic trace blood noted in the vaginal vault  Cervix: Very difficult to visualize the cervix and spared a palpable however no gross lesions were noted  Uterus: Unable to assess size. The parametrium was too distant for palpation  Adnexa: No palpable masses. Rectal  Good tone, no masses no cul de sac nodularity.  Extremities  No bilateral cyanosis, clubbing or edema.   Assessment/Plan:  Valerie Ramirez  is a 79 y.o.  year old with a FIGO grade 1 endometrial adenocarcinoma.  A detailed discussion was held with the patient and her family with regard to her endometrial cancer diagnosis. We discussed the standard management options for uterine cancer which includes surgery followed possibly by adjuvant therapy depending on the results of surgery. The surgical management includes a hysterectomy and removal of the tubes and ovaries possibly with removal of pelvic and para-aortic lymph nodes.The patient has been counseled about the risks of the procedure which include but are not limited to infection, bleeding, damage to adjacent organs, nerves, vessels and clot formation and she is  willing to proceed.  All of her questions have been answered.   She is aware that the procedure will be performed by Dr. De Blanch on 07/04/2012   Laurette Schimke, MD, PhD 06/08/2012, 1:49 PM

## 2012-07-04 NOTE — Anesthesia Preprocedure Evaluation (Addendum)
Anesthesia Evaluation  Patient identified by MRN, date of birth, ID band Patient awake    Reviewed: Allergy & Precautions, H&P , NPO status , Patient's Chart, lab work & pertinent test results, reviewed documented beta blocker date and time   Airway Mallampati: II TM Distance: >3 FB Neck ROM: full    Dental  (+) Dental Advisory Given, Edentulous Lower and Missing Missing all left upper side teeth:   Pulmonary neg pulmonary ROS,  breath sounds clear to auscultation  Pulmonary exam normal       Cardiovascular Exercise Tolerance: Good hypertension, Pt. on medications and Pt. on home beta blockers Rhythm:regular Rate:Normal  History cardiomyopathy   Neuro/Psych negative neurological ROS  negative psych ROS   GI/Hepatic negative GI ROS, Neg liver ROS,   Endo/Other  negative endocrine ROSHypothyroidism Morbid obesity  Renal/GU negative Renal ROS  negative genitourinary   Musculoskeletal   Abdominal (+) + obese,   Peds  Hematology negative hematology ROS (+)   Anesthesia Other Findings   Reproductive/Obstetrics negative OB ROS                         Anesthesia Physical Anesthesia Plan  ASA: III  Anesthesia Plan: General   Post-op Pain Management:    Induction: Intravenous  Airway Management Planned: Oral ETT  Additional Equipment:   Intra-op Plan:   Post-operative Plan: Extubation in OR  Informed Consent: I have reviewed the patients History and Physical, chart, labs and discussed the procedure including the risks, benefits and alternatives for the proposed anesthesia with the patient or authorized representative who has indicated his/her understanding and acceptance.   Dental Advisory Given  Plan Discussed with: CRNA and Surgeon  Anesthesia Plan Comments:         Anesthesia Quick Evaluation

## 2012-07-05 ENCOUNTER — Encounter (HOSPITAL_COMMUNITY): Payer: Self-pay | Admitting: Gynecology

## 2012-07-05 LAB — CBC
HCT: 31.7 % — ABNORMAL LOW (ref 36.0–46.0)
Hemoglobin: 10.6 g/dL — ABNORMAL LOW (ref 12.0–15.0)
MCH: 30.1 pg (ref 26.0–34.0)
MCHC: 33.4 g/dL (ref 30.0–36.0)
MCV: 90.1 fL (ref 78.0–100.0)
Platelets: 159 10*3/uL (ref 150–400)
RBC: 3.52 MIL/uL — ABNORMAL LOW (ref 3.87–5.11)
RDW: 14.7 % (ref 11.5–15.5)
WBC: 14.5 10*3/uL — ABNORMAL HIGH (ref 4.0–10.5)

## 2012-07-05 LAB — BASIC METABOLIC PANEL
BUN: 15 mg/dL (ref 6–23)
CO2: 21 mEq/L (ref 19–32)
Calcium: 8.6 mg/dL (ref 8.4–10.5)
Chloride: 102 mEq/L (ref 96–112)
Creatinine, Ser: 1.01 mg/dL (ref 0.50–1.10)
GFR calc Af Amer: 61 mL/min — ABNORMAL LOW (ref >90)
GFR calc non Af Amer: 52 mL/min — ABNORMAL LOW (ref >90)
Glucose, Bld: 149 mg/dL — ABNORMAL HIGH (ref 70–99)
Potassium: 3.9 mEq/L (ref 3.5–5.1)
Sodium: 133 mEq/L — ABNORMAL LOW (ref 135–145)

## 2012-07-05 LAB — GLUCOSE, CAPILLARY
Glucose-Capillary: 140 mg/dL — ABNORMAL HIGH (ref 70–99)
Glucose-Capillary: 123 mg/dL — ABNORMAL HIGH (ref 70–99)

## 2012-07-05 MED ORDER — KCL IN DEXTROSE-NACL 20-5-0.9 MEQ/L-%-% IV SOLN
INTRAVENOUS | Status: DC
  Administered 2012-07-05 – 2012-07-07 (×7): via INTRAVENOUS
  Administered 2012-07-08: 125 mL/h via INTRAVENOUS
  Administered 2012-07-08 – 2012-07-09 (×2): via INTRAVENOUS
  Administered 2012-07-09: 125 mL/h via INTRAVENOUS
  Administered 2012-07-09 – 2012-07-10 (×2): via INTRAVENOUS
  Filled 2012-07-05 (×20): qty 1000

## 2012-07-05 MED ORDER — INSULIN ASPART 100 UNIT/ML ~~LOC~~ SOLN
0.0000 [IU] | SUBCUTANEOUS | Status: DC
  Administered 2012-07-05 – 2012-07-11 (×14): 3 [IU] via SUBCUTANEOUS

## 2012-07-05 NOTE — Progress Notes (Signed)
1 Day Post-Op Procedure(s) (LRB): EXPLORATORY LAPAROTOMY TOTAL ABDOMINAL HYSTERECTOMY BILATERAL SALPINGO-OOPHORECTOMY with small bowel resection  (Bilateral) SMALL BOWEL RESECTION  Subjective: Patient reports moderate back pain.  She reports that she is supposed to have back surgery in the near future.  Mild incisional pain relieved with PCA use.  Denies nausea or emesis.  Denies chest pain, dyspnea, or passing flatus.    Objective: Vital signs in last 24 hours: Temp:  [97.3 F (36.3 C)-99.3 F (37.4 C)] 99 F (37.2 C) (03/19 0603) Pulse Rate:  [56-92] 89 (03/19 0603) Resp:  [11-20] 20 (03/19 0603) BP: (111-151)/(44-78) 142/63 mmHg (03/19 0603) SpO2:  [92 %-100 %] 96 % (03/19 0603) Weight:  [255 lb (115.667 kg)] 255 lb (115.667 kg) (03/18 1315) Last BM Date: 07/04/12  Intake/Output from previous day: 03/18 0701 - 03/19 0700 In: 4228.8 [I.V.:4128.8; IV Piggyback:100] Out: 1938 [Urine:1300; Drains:38; Blood:600]  Physical Examination: General: alert, cooperative and no distress Resp: diminished breath sounds mildly, bilaterally in the lower bases Cardio: regular rhythm but irregulary irregular rate.  S1 and S2 normal.  No murmurs, rubs, or clicks. GI: abnormal findings:  obese and abdomen soft, faint bowel sounds and incision: midline incision with dry dressing in place.  JP charged with serosanguinous drainage present. Extremities: extremities normal, atraumatic, no cyanosis or edema  Labs: WBC/Hgb/Hct/Plts:  14.5/10.6/31.7/159 (03/19 0425) BUN/Cr/glu/ALT/AST/amyl/lip:  15/1.01/--/--/--/--/-- (03/19 0425)  Assessment: 79 y.o. s/p Procedure(s): EXPLORATORY LAPAROTOMY TOTAL ABDOMINAL HYSTERECTOMY BILATERAL SALPINGO-OOPHORECTOMY with small bowel resection  SMALL BOWEL RESECTION: stable Pain:  Pain is well-controlled on PCA.  Heme:  Hgb 10.6 and Hct 31.7 post-operatively.  CV:  History of hypertension.  Current treatment: Norvasc, Cardura, Lisinopril, Metoprolol.  GI:   Tolerating po: No: NPO.     FEN:  Na+ 133 this am.  Endo: Diabetes mellitus Type II, under good control. Glucose this am 149.  Prophylaxis: pharmacologic prophylaxis (with any of the following: enoxaparin (Lovenox) 40mg  SQ 2 hours prior to surgery then every day) and intermittent pneumatic compression boots.  Plan: Change IVF to D5NS + 20 meq KCL at same rate Encourage ambulation Encourage IS use, deep breathing, and coughing Continue post-operative plan of care Begin CBG Q4H and sliding scale   LOS: 1 day    CROSS, MELISSA DEAL 07/05/2012, 10:14 AM

## 2012-07-06 ENCOUNTER — Ambulatory Visit (HOSPITAL_COMMUNITY): Payer: Medicare Other

## 2012-07-06 DIAGNOSIS — J90 Pleural effusion, not elsewhere classified: Secondary | ICD-10-CM | POA: Diagnosis not present

## 2012-07-06 DIAGNOSIS — J9819 Other pulmonary collapse: Secondary | ICD-10-CM | POA: Diagnosis not present

## 2012-07-06 DIAGNOSIS — C549 Malignant neoplasm of corpus uteri, unspecified: Secondary | ICD-10-CM | POA: Diagnosis not present

## 2012-07-06 DIAGNOSIS — I700 Atherosclerosis of aorta: Secondary | ICD-10-CM | POA: Diagnosis not present

## 2012-07-06 LAB — TROPONIN I
Troponin I: <0.3 ng/mL (ref <0.30)
Troponin I: <0.3 ng/mL (ref <0.30)

## 2012-07-06 LAB — CBC
HCT: 31.5 % — ABNORMAL LOW (ref 36.0–46.0)
Hemoglobin: 10.4 g/dL — ABNORMAL LOW (ref 12.0–15.0)
MCH: 30.1 pg (ref 26.0–34.0)
MCHC: 33 g/dL (ref 30.0–36.0)
MCV: 91 fL (ref 78.0–100.0)
Platelets: 170 10*3/uL (ref 150–400)
RBC: 3.46 MIL/uL — ABNORMAL LOW (ref 3.87–5.11)
RDW: 14.8 % (ref 11.5–15.5)
WBC: 15.6 10*3/uL — ABNORMAL HIGH (ref 4.0–10.5)

## 2012-07-06 LAB — GLUCOSE, CAPILLARY
Glucose-Capillary: 116 mg/dL — ABNORMAL HIGH (ref 70–99)
Glucose-Capillary: 119 mg/dL — ABNORMAL HIGH (ref 70–99)
Glucose-Capillary: 123 mg/dL — ABNORMAL HIGH (ref 70–99)
Glucose-Capillary: 126 mg/dL — ABNORMAL HIGH (ref 70–99)
Glucose-Capillary: 128 mg/dL — ABNORMAL HIGH (ref 70–99)
Glucose-Capillary: 133 mg/dL — ABNORMAL HIGH (ref 70–99)
Glucose-Capillary: 133 mg/dL — ABNORMAL HIGH (ref 70–99)
Glucose-Capillary: 144 mg/dL — ABNORMAL HIGH (ref 70–99)

## 2012-07-06 LAB — BASIC METABOLIC PANEL
BUN: 10 mg/dL (ref 6–23)
CO2: 22 mEq/L (ref 19–32)
Calcium: 8.7 mg/dL (ref 8.4–10.5)
Chloride: 103 mEq/L (ref 96–112)
Creatinine, Ser: 0.98 mg/dL (ref 0.50–1.10)
GFR calc Af Amer: 63 mL/min — ABNORMAL LOW (ref >90)
GFR calc non Af Amer: 54 mL/min — ABNORMAL LOW (ref >90)
Glucose, Bld: 135 mg/dL — ABNORMAL HIGH (ref 70–99)
Potassium: 4.2 mEq/L (ref 3.5–5.1)
Sodium: 134 mEq/L — ABNORMAL LOW (ref 135–145)

## 2012-07-06 LAB — TSH: TSH: 3.536 u[IU]/mL (ref 0.350–4.500)

## 2012-07-06 MED ORDER — PANTOPRAZOLE SODIUM 40 MG IV SOLR
40.0000 mg | INTRAVENOUS | Status: DC
  Administered 2012-07-07 – 2012-07-08 (×2): 40 mg via INTRAVENOUS
  Filled 2012-07-06 (×3): qty 40

## 2012-07-06 MED ORDER — PANTOPRAZOLE SODIUM 40 MG IV SOLR
40.0000 mg | Freq: Once | INTRAVENOUS | Status: AC
  Administered 2012-07-06: 40 mg via INTRAVENOUS
  Filled 2012-07-06: qty 40

## 2012-07-06 MED ORDER — IOHEXOL 350 MG/ML SOLN
100.0000 mL | Freq: Once | INTRAVENOUS | Status: AC | PRN
  Administered 2012-07-06: 100 mL via INTRAVENOUS

## 2012-07-06 MED ORDER — PNEUMOCOCCAL VAC POLYVALENT 25 MCG/0.5ML IJ INJ
0.5000 mL | INJECTION | INTRAMUSCULAR | Status: AC
Start: 2012-07-07 — End: 2012-07-07
  Administered 2012-07-07: 0.5 mL via INTRAMUSCULAR
  Filled 2012-07-06 (×2): qty 0.5

## 2012-07-06 NOTE — Progress Notes (Signed)
2 Days Post-Op Procedure(s) (LRB): EXPLORATORY LAPAROTOMY TOTAL ABDOMINAL HYSTERECTOMY BILATERAL SALPINGO-OOPHORECTOMY with small bowel resection  (Bilateral) SMALL BOWEL RESECTION  Subjective: Patient reports severe heartburn that is "about to choke me."  Moderate back pain remains.  Chest pain when taking a deep breath.  Denies shortness of breath or pain radiation.  Mild incisional pain relieved with PCA use.  Denies nausea, emesis, or passing flatus.    Objective: Vital signs in last 24 hours: Temp:  [97.8 F (36.6 C)-98.7 F (37.1 C)] 98.4 F (36.9 C) (03/20 0600) Pulse Rate:  [79-91] 91 (03/20 0600) Resp:  [12-20] 12 (03/20 0833) BP: (133-170)/(56-75) 155/72 mmHg (03/20 0600) SpO2:  [91 %-95 %] 91 % (03/20 0833) Last BM Date: 07/04/12  Intake/Output from previous day: 03/19 0701 - 03/20 0700 In: 2918.8 [I.V.:2918.8] Out: 1830 [Urine:1800; Drains:30]  Physical Examination: General: alert, cooperative and no distress Resp: diminished breath sounds mildly, bilaterally in the lower bases Cardio: irregularly irregular rhythm and No murmurs, rubs, or clicks. GI: abnormal findings:  hypoactive bowel sounds, obese and abdomen soft and incision: midline incision with dry dressing in place.  JP charged with serosanguinous drainage present. Extremities: extremities normal, atraumatic, no cyanosis or edema  Assessment: 79 y.o. s/p Procedure(s): EXPLORATORY LAPAROTOMY TOTAL ABDOMINAL HYSTERECTOMY BILATERAL SALPINGO-OOPHORECTOMY with small bowel resection  SMALL BOWEL RESECTION: stable Pain:  Pain is well-controlled on PCA.  Heme:  Hgb 10.6 and Hct 31.7 post-operatively yesterday.  CV:  History of hypertension.  Current treatment: Norvasc, Cardura, Lisinopril, Metoprolol.  Pre-op EKG with normal sinus rhythm with sinus arrhythmia.  GI:  Tolerating po: No: NPO.     FEN:  Na+ 133 on 07/05/12.  Endo: Diabetes mellitus Type II, under good control.  CBG (last 3)   Recent Labs  07/06/12 0026 07/06/12 0358 07/06/12 0757  GLUCAP 119* 128* 123*    Prophylaxis: pharmacologic prophylaxis (with any of the following: enoxaparin (Lovenox) 40mg  SQ 2 hours prior to surgery then every day) and intermittent pneumatic compression boots.  Plan: Troponin I now and in 6 hours CBC, Bmet, and TSH now EKG CT angiogram of the chest r/o PE Protonix IV daily Encourage ambulation Encourage IS use, deep breathing, and coughing Continue post-operative plan of care    LOS: 2 days    CROSS, MELISSA DEAL 07/06/2012, 9:01 AM

## 2012-07-07 LAB — CBC
HCT: 28.8 % — ABNORMAL LOW (ref 36.0–46.0)
Hemoglobin: 9.5 g/dL — ABNORMAL LOW (ref 12.0–15.0)
MCH: 30.1 pg (ref 26.0–34.0)
MCHC: 33 g/dL (ref 30.0–36.0)
MCV: 91.1 fL (ref 78.0–100.0)
Platelets: 149 10*3/uL — ABNORMAL LOW (ref 150–400)
RBC: 3.16 MIL/uL — ABNORMAL LOW (ref 3.87–5.11)
RDW: 15 % (ref 11.5–15.5)
WBC: 14.1 10*3/uL — ABNORMAL HIGH (ref 4.0–10.5)

## 2012-07-07 LAB — GLUCOSE, CAPILLARY
Glucose-Capillary: 106 mg/dL — ABNORMAL HIGH (ref 70–99)
Glucose-Capillary: 108 mg/dL — ABNORMAL HIGH (ref 70–99)
Glucose-Capillary: 118 mg/dL — ABNORMAL HIGH (ref 70–99)
Glucose-Capillary: 119 mg/dL — ABNORMAL HIGH (ref 70–99)
Glucose-Capillary: 122 mg/dL — ABNORMAL HIGH (ref 70–99)
Glucose-Capillary: 128 mg/dL — ABNORMAL HIGH (ref 70–99)

## 2012-07-07 LAB — BASIC METABOLIC PANEL
BUN: 10 mg/dL (ref 6–23)
CO2: 24 mEq/L (ref 19–32)
Calcium: 8.8 mg/dL (ref 8.4–10.5)
Chloride: 107 mEq/L (ref 96–112)
Creatinine, Ser: 1.04 mg/dL (ref 0.50–1.10)
GFR calc Af Amer: 59 mL/min — ABNORMAL LOW (ref >90)
GFR calc non Af Amer: 50 mL/min — ABNORMAL LOW (ref >90)
Glucose, Bld: 113 mg/dL — ABNORMAL HIGH (ref 70–99)
Potassium: 4.2 mEq/L (ref 3.5–5.1)
Sodium: 137 mEq/L (ref 135–145)

## 2012-07-07 MED ORDER — SIMETHICONE 80 MG PO CHEW
80.0000 mg | CHEWABLE_TABLET | Freq: Four times a day (QID) | ORAL | Status: DC
  Administered 2012-07-07 – 2012-07-11 (×14): 80 mg via ORAL
  Filled 2012-07-07 (×20): qty 1

## 2012-07-07 NOTE — Progress Notes (Signed)
3 Days Post-Op Procedure(s) (LRB): EXPLORATORY LAPAROTOMY TOTAL ABDOMINAL HYSTERECTOMY BILATERAL SALPINGO-OOPHORECTOMY with small bowel resection  (Bilateral) SMALL BOWEL RESECTION  Subjective: Patient reports  No complaints.  Objective: I have reviewed patient's vital signs, intake and output and medications.  General: alert and no distress PE: Abdomen:  + BS.  Incision clean.  Slightly distended.  Nontender. Assessment: s/p Procedure(s): EXPLORATORY LAPAROTOMY TOTAL ABDOMINAL HYSTERECTOMY BILATERAL SALPINGO-OOPHORECTOMY with small bowel resection  (Bilateral) SMALL BOWEL RESECTION: stable.  Post op ileus.  Slowly resolving.  Plan: Advance diet.  Clear liquids.  LOS: 3 days    HARPER,CHARLES A 07/07/2012, 12:03 PM

## 2012-07-08 LAB — GLUCOSE, CAPILLARY
Glucose-Capillary: 127 mg/dL — ABNORMAL HIGH (ref 70–99)
Glucose-Capillary: 111 mg/dL — ABNORMAL HIGH (ref 70–99)
Glucose-Capillary: 110 mg/dL — ABNORMAL HIGH (ref 70–99)
Glucose-Capillary: 123 mg/dL — ABNORMAL HIGH (ref 70–99)
Glucose-Capillary: 129 mg/dL — ABNORMAL HIGH (ref 70–99)

## 2012-07-08 MED ORDER — PANTOPRAZOLE SODIUM 40 MG PO TBEC
40.0000 mg | DELAYED_RELEASE_TABLET | Freq: Every day | ORAL | Status: DC
  Administered 2012-07-09 – 2012-07-11 (×3): 40 mg via ORAL
  Filled 2012-07-08 (×3): qty 1

## 2012-07-08 NOTE — Progress Notes (Signed)
Patient ID: Valerie Ramirez, female   DOB: 1932/07/01, 79 y.o.   MRN: [id number] 4 Days Post-Op Procedure(s) (LRB): EXPLORATORY LAPAROTOMY TOTAL ABDOMINAL HYSTERECTOMY BILATERAL SALPINGO-OOPHORECTOMY with small bowel resection  (Bilateral) SMALL BOWEL RESECTION  Subjective: Patient reports no complaints.  tolerating PO.  Clears. Temp:  [98.3 F (36.8 C)-98.8 F (37.1 C)] 98.3 F (36.8 C) (03/22 1313) Pulse Rate:  [68-86] 68 (03/22 1003) Resp:  [18-24] 19 (03/22 1626) BP: (156-168)/(62-71) 168/71 mmHg (03/22 1313) SpO2:  [92 %-100 %] 97 % (03/22 1626) Last BM Date: 07/07/12  Intake/Output from previous day: 03/21 0701 - 03/22 0700 In: 3642.9 [P.O.:620; I.V.:3022.9] Out: 900 [Urine:900]  Physical Examination: GI: normal findings: bowel sounds normal and soft, non-tender Extremities: extremities normal, atraumatic, no cyanosis or edema Vaginal Bleeding: none  Labs:       Assessment:  79 y.o. s/p Procedure(s): EXPLORATORY LAPAROTOMY TOTAL ABDOMINAL HYSTERECTOMY BILATERAL SALPINGO-OOPHORECTOMY with small bowel resection  SMALL BOWEL RESECTION: stable Pain:  Pain is well-controlled on PCA or oral medications.  Heme: Stable  ID: n/a  CV: Stable    GI:  Tolerating po  Plan: Advance diet Encourage ambulation    LOS: 4 days    Saurav Crumble A 07/08/2012, 8:21 PM

## 2012-07-08 NOTE — Progress Notes (Signed)
  Pharmacy Note: IV-->PO Conversion  This patient is receiving IV Protonix. Based on criteria approved by the Pharmacy and Therapeutics Committee, this medication is being converted to the equivalent oral dose form. These criteria include:   . The patient is eating (either orally or per tube) and/or has been taking other orally administered medications for at least 24 hours.  . This patient has no evidence of active gastrointestinal bleeding or impaired GI absorption (gastrectomy, short bowel, patient on TNA or NPO).   If you have questions about this conversion, please contact the pharmacy department.  Darrol Angel, PharmD Pager: [phone] 07/08/2012 2:10 PM

## 2012-07-09 LAB — GLUCOSE, CAPILLARY
Glucose-Capillary: 122 mg/dL — ABNORMAL HIGH (ref 70–99)
Glucose-Capillary: 119 mg/dL — ABNORMAL HIGH (ref 70–99)
Glucose-Capillary: 126 mg/dL — ABNORMAL HIGH (ref 70–99)
Glucose-Capillary: 123 mg/dL — ABNORMAL HIGH (ref 70–99)

## 2012-07-09 MED ORDER — PSYLLIUM 95 % PO PACK
1.0000 | PACK | Freq: Two times a day (BID) | ORAL | Status: DC
  Administered 2012-07-09 – 2012-07-11 (×5): 1 via ORAL
  Filled 2012-07-09 (×6): qty 1

## 2012-07-09 MED ORDER — VITAMINS A & D EX OINT
TOPICAL_OINTMENT | CUTANEOUS | Status: AC
  Administered 2012-07-09
  Filled 2012-07-09: qty 10

## 2012-07-09 NOTE — Progress Notes (Signed)
5 Days Post-Op Procedure(s) (LRB): EXPLORATORY LAPAROTOMY TOTAL ABDOMINAL HYSTERECTOMY BILATERAL SALPINGO-OOPHORECTOMY with small bowel resection  (Bilateral) SMALL BOWEL RESECTION  Subjective: Patient reports tolerating PO and + flatus.    Objective: I have reviewed patient's vital signs, intake and output, medications and labs.  General: alert and no distress GI: normal findings: soft, non-tender and Incision clean. Extremities: extremities normal, atraumatic, no cyanosis or edema Vaginal Bleeding: none  Assessment: s/p Procedure(s): EXPLORATORY LAPAROTOMY TOTAL ABDOMINAL HYSTERECTOMY BILATERAL SALPINGO-OOPHORECTOMY with small bowel resection  (Bilateral) SMALL BOWEL RESECTION: stable and Continue clears.  Plan: Clear liquids  LOS: 5 days    Erique Kaser A 07/09/2012, 6:19 AM

## 2012-07-09 NOTE — Progress Notes (Signed)
Dr Clearance Coots called for report on patient.  Informed MD that patient is having loose, explosive BMs throughout night.  Discussed options for patient.  Nurse was asked to place order for carb modified diet.

## 2012-07-10 LAB — GLUCOSE, CAPILLARY
Glucose-Capillary: 101 mg/dL — ABNORMAL HIGH (ref 70–99)
Glucose-Capillary: 114 mg/dL — ABNORMAL HIGH (ref 70–99)
Glucose-Capillary: 124 mg/dL — ABNORMAL HIGH (ref 70–99)
Glucose-Capillary: 137 mg/dL — ABNORMAL HIGH (ref 70–99)
Glucose-Capillary: 99 mg/dL (ref 70–99)

## 2012-07-10 NOTE — Progress Notes (Signed)
6 Days Post-Op Procedure(s) (LRB): EXPLORATORY LAPAROTOMY TOTAL ABDOMINAL HYSTERECTOMY BILATERAL SALPINGO-OOPHORECTOMY with small bowel resection  (Bilateral) SMALL BOWEL RESECTION  Subjective: Patient reports tolerating PO, + flatus and no problems voiding.    Objective: I have reviewed patient's vital signs, intake and output, medications, labs, microbiology and pathology.  General: alert and no distress GI: normal findings: bowel sounds normal and soft, non-tender and incision: clean, dry and intact Extremities: extremities normal, atraumatic, no cyanosis or edema Vaginal Bleeding: none  Assessment: s/p Procedure(s): EXPLORATORY LAPAROTOMY TOTAL ABDOMINAL HYSTERECTOMY BILATERAL SALPINGO-OOPHORECTOMY with small bowel resection  (Bilateral) SMALL BOWEL RESECTION: stable and ileus resolving.  Plan: Advance diet  LOS: 6 days    Valerie Ramirez A 07/10/2012, 6:39 PM

## 2012-07-11 LAB — GLUCOSE, CAPILLARY
Glucose-Capillary: 113 mg/dL — ABNORMAL HIGH (ref 70–99)
Glucose-Capillary: 124 mg/dL — ABNORMAL HIGH (ref 70–99)
Glucose-Capillary: 102 mg/dL — ABNORMAL HIGH (ref 70–99)

## 2012-07-11 MED ORDER — OXYCODONE-ACETAMINOPHEN 5-325 MG PO TABS: 1.0000 | ORAL_TABLET | ORAL | Status: DC | PRN

## 2012-07-11 MED ORDER — ENOXAPARIN (LOVENOX) PATIENT EDUCATION KIT
PACK | Freq: Once | Status: DC
  Filled 2012-07-11: qty 1

## 2012-07-11 MED ORDER — ENOXAPARIN SODIUM 40 MG/0.4ML ~~LOC~~ SOLN: 40.0000 mg | SUBCUTANEOUS | Status: DC

## 2012-07-11 NOTE — Progress Notes (Signed)
Pt for d/c home today. IV-NSL d/c'd. Dressing CDI to abdomen. JP drain removed per Harriett Sine, RN as well as staples to midline incision & Steristips applied. D/C instructions & Rx given with verbalized understanding. Lovenox kit provided for home use. Dau & husband in to assist with d/c.

## 2012-07-11 NOTE — Discharge Summary (Signed)
Physician Discharge Summary  Patient ID: Valerie Ramirez MRN: [id number] DOB/AGE: [birth year] 79 y.o.  Admit date: 07/04/2012 Discharge date: 07/11/2012  Admission Diagnoses: Endometrial cancer  Discharge Diagnoses:  Principal Problem:   Endometrial cancer  Diabetes  Discharged Condition: The patient is in good condition and stable for discharge.  Hospital Course:  On 07/04/2012, the patient underwent the following: Procedure(s):  EXPLORATORY LAPAROTOMY TOTAL ABDOMINAL HYSTERECTOMY BILATERAL SALPINGO-OOPHORECTOMY with small bowel resection.  The postoperative course was uneventful and diet advancement was initiated slowly.  Due to dyspnea and chest pain symptoms post-operatively, cardiac labs, CT of the chest, and repeat EKG were performed with no significant findings.  She was discharged to home on postoperative day 7 tolerating a regular diet.  Her JP drain and staples were removed by Telford Nab, RN without difficulty and steri strips were applied.  Consults:  None  Significant Diagnostic Studies: None  Treatments: IV hydration and surgery: see above.  EKG  Discharge Exam: Blood pressure 150/80, pulse 85, temperature 98.3 F (36.8 C), temperature source Oral, resp. rate 20, height 5\' 2"  (1.575 m), weight 255 lb (115.667 kg), SpO2 96.00%. General appearance: alert, cooperative and no distress Resp: clear to auscultation bilaterally Cardio: regularly irregular rhythm and with inspiration GI: soft, non-tender; bowel sounds normal; no masses,  no organomegaly and obese Extremities: extremities normal, atraumatic, no cyanosis or edema Incision/Wound: Midline incision with staples open to air.  Disposition:  Home       Discharge Orders   Future Appointments Provider Department Dept Phone   07/28/2012 8:00 AM [facility] [phone]   08/03/2012 11:30 AM Laurette Schimke, MD [facility] [phone]   08/08/2012 10:30 AM Judy Pimple, MD [facility] [place] [phone]   Future Orders Complete By Expires     Call MD for:  difficulty breathing, headache or visual disturbances  As directed     Call MD for:  extreme fatigue  As directed     Call MD for:  hives  As directed     Call MD for:  persistant dizziness or light-headedness  As directed     Call MD for:  persistant nausea and vomiting  As directed     Call MD for:  redness, tenderness, or signs of infection (pain, swelling, redness, odor or green/yellow discharge around incision site)  As directed     Call MD for:  severe uncontrolled pain  As directed     Call MD for:  temperature >100.4  As directed     Diet - low sodium heart healthy  As directed     Driving Restrictions  As directed     Comments:      No driving for 2 weeks.  Do not take narcotics and drive.    Increase activity slowly  As directed     Lifting restrictions  As directed     Comments:      No lifting greater than 10 lbs.    Sexual Activity Restrictions  As directed     Comments:      No sexual activity, nothing in the vagina, for 6 weeks.        Medication List    STOP taking these medications       aspirin 325 MG tablet      TAKE these medications       amLODipine 5 MG tablet  Commonly known as:  NORVASC  Take 5 mg  by mouth every morning.     cyanocobalamin 500 MCG tablet  Take by mouth as directed.     doxazosin 8 MG tablet  Commonly known as:  CARDURA  Take 8 mg by mouth at bedtime.     ECHINACEA PO  Take 1 tablet by mouth daily.     enoxaparin 40 MG/0.4ML injection  Commonly known as:  LOVENOX  Inject 0.4 mLs (40 mg total) into the skin daily.     lisinopril 5 MG tablet  Commonly known as:  PRINIVIL,ZESTRIL  Take 5 mg by mouth at bedtime.     metoprolol 100 MG tablet  Commonly known as:  LOPRESSOR  Take 100 mg by mouth 2 (two) times daily.     oxyCODONE-acetaminophen 5-325 MG per tablet  Commonly known as:   PERCOCET/ROXICET  Take 1-2 tablets by mouth every 4 (four) hours as needed (severe pain).       Follow-up Information   Follow up with Laurette Schimke, MD PHD.   Contact information:   [address] [place] [address] [phone]      Signed: Ajane Novella DEAL 07/11/2012, 12:43 PM

## 2012-07-12 ENCOUNTER — Telehealth: Payer: Self-pay | Admitting: Family Medicine

## 2012-07-12 NOTE — Telephone Encounter (Signed)
Transitional Care Call: Spoke with Valerie Ramirez.  D/C date 07/11/2012.  D/C dx: endometrial cancer and diabetes.  Sent home in good condition.  Valerie Ramirez states she is feeling "pretty good" and has not taken any pain medication in a few days.  She lives at home with her husband who is assisting with her care.  Verbalizes understanding of medication list and discharge instructions.  Denies any questions for Dr. Milinda Antis.  Follow up appt scheduled with Dr. Laurette Schimke on 08/03/2012 @ 1130.

## 2012-07-13 ENCOUNTER — Ambulatory Visit (INDEPENDENT_AMBULATORY_CARE_PROVIDER_SITE_OTHER): Payer: Medicare Other | Admitting: Family Medicine

## 2012-07-13 ENCOUNTER — Telehealth: Payer: Self-pay

## 2012-07-13 ENCOUNTER — Encounter: Payer: Self-pay | Admitting: Family Medicine

## 2012-07-13 VITALS — BP 122/62 | HR 77 | Temp 98.0°F

## 2012-07-13 DIAGNOSIS — M109 Gout, unspecified: Secondary | ICD-10-CM

## 2012-07-13 MED ORDER — COLCHICINE 0.6 MG PO TABS: 0.6000 mg | ORAL_TABLET | Freq: Every day | ORAL | Status: DC

## 2012-07-13 NOTE — Telephone Encounter (Signed)
Pharmacist from [facility] request to substitute Colcrys 0.6 for colchicine 0.6. Dr Para March said OK to use Colcrys. Pharmacist voiced understanding.

## 2012-07-13 NOTE — Progress Notes (Signed)
Swelling noted in L foot before discharge from hospital, got better and returned yesterday.  In L foot only.  Pain started night before last.  Had TAH BSO with partial small bowel resection, but o/w uncomplicated hospital course.   No FCNAVD.  Her abd feels fine and with minimal discomfort.    No swelling on R foot.   Pain on L foot if the bed sheet lays across the foot.   Not SOB, no CP.  No sx like this prev.    Meds, vitals, and allergies reviewed.   ROS: See HPI.  Otherwise, noncontributory.  nad rrr ctab abd soft, incision healing normally. Ext w/o edema except the L foot.  She is ttp and red at the L 1st MTP.  Midfoot is less painful.  Calf circ equal B

## 2012-07-13 NOTE — Patient Instructions (Signed)
This looks like gout.  Take the percocet for pain and add on colchicine for pain.  That should help.  If you have a fever, then notify our clinic or the surgeon's clinic.  Take care.

## 2012-07-14 DIAGNOSIS — M109 Gout, unspecified: Secondary | ICD-10-CM | POA: Insufficient documentation

## 2012-07-14 NOTE — Assessment & Plan Note (Signed)
Presumed, looks to be classic presentation.  No sign of DVT (already on proph lovenox).  Would use colchicine and continue oxycodone in meantime.  First flare, so we didn't check uric acid.  This does appear to be an infectious process.   Gout d/w pt, handout given. Fu prn.

## 2012-07-27 ENCOUNTER — Telehealth: Payer: Self-pay | Admitting: Family Medicine

## 2012-07-27 ENCOUNTER — Other Ambulatory Visit: Payer: Self-pay | Admitting: Family Medicine

## 2012-07-27 DIAGNOSIS — E7800 Pure hypercholesterolemia, unspecified: Secondary | ICD-10-CM

## 2012-07-27 DIAGNOSIS — M109 Gout, unspecified: Secondary | ICD-10-CM

## 2012-07-27 DIAGNOSIS — R945 Abnormal results of liver function studies: Secondary | ICD-10-CM

## 2012-07-27 DIAGNOSIS — E039 Hypothyroidism, unspecified: Secondary | ICD-10-CM

## 2012-07-27 DIAGNOSIS — R739 Hyperglycemia, unspecified: Secondary | ICD-10-CM

## 2012-07-27 DIAGNOSIS — I10 Essential (primary) hypertension: Secondary | ICD-10-CM

## 2012-07-27 NOTE — Telephone Encounter (Signed)
orders

## 2012-07-27 NOTE — Telephone Encounter (Signed)
Message copied by Judy Pimple on Thu Jul 27, 2012  3:53 PM ------      Message from: Baldomero Lamy      Created: Thu Jul 20, 2012  1:27 PM      Regarding: Cpx labs Fri 4/11       Please order  future cpx labs for pt's upcoming lab appt.      Thanks      Tasha       ------

## 2012-07-28 ENCOUNTER — Other Ambulatory Visit (INDEPENDENT_AMBULATORY_CARE_PROVIDER_SITE_OTHER): Payer: Medicare Other

## 2012-07-28 DIAGNOSIS — R7309 Other abnormal glucose: Secondary | ICD-10-CM | POA: Diagnosis not present

## 2012-07-28 DIAGNOSIS — R945 Abnormal results of liver function studies: Secondary | ICD-10-CM

## 2012-07-28 DIAGNOSIS — M109 Gout, unspecified: Secondary | ICD-10-CM | POA: Diagnosis not present

## 2012-07-28 DIAGNOSIS — E7800 Pure hypercholesterolemia, unspecified: Secondary | ICD-10-CM | POA: Diagnosis not present

## 2012-07-28 DIAGNOSIS — I10 Essential (primary) hypertension: Secondary | ICD-10-CM

## 2012-07-28 DIAGNOSIS — E039 Hypothyroidism, unspecified: Secondary | ICD-10-CM

## 2012-07-28 DIAGNOSIS — R739 Hyperglycemia, unspecified: Secondary | ICD-10-CM

## 2012-07-28 LAB — CBC WITH DIFFERENTIAL/PLATELET
Basophils Absolute: 0.1 10*3/uL (ref 0.0–0.1)
Basophils Relative: 0.8 % (ref 0.0–3.0)
Eosinophils Absolute: 1.2 10*3/uL — ABNORMAL HIGH (ref 0.0–0.7)
Eosinophils Relative: 13.5 % — ABNORMAL HIGH (ref 0.0–5.0)
HCT: 30.7 % — ABNORMAL LOW (ref 36.0–46.0)
Hemoglobin: 10.2 g/dL — ABNORMAL LOW (ref 12.0–15.0)
Lymphocytes Relative: 31.7 % (ref 12.0–46.0)
Lymphs Abs: 2.7 10*3/uL (ref 0.7–4.0)
MCHC: 33.1 g/dL (ref 30.0–36.0)
MCV: 88.2 fl (ref 78.0–100.0)
Monocytes Absolute: 0.9 10*3/uL (ref 0.1–1.0)
Monocytes Relative: 10 % (ref 3.0–12.0)
Neutro Abs: 3.8 10*3/uL (ref 1.4–7.7)
Neutrophils Relative %: 44 % (ref 43.0–77.0)
Platelets: 206 10*3/uL (ref 150.0–400.0)
RBC: 3.48 Mil/uL — ABNORMAL LOW (ref 3.87–5.11)
RDW: 16.3 % — ABNORMAL HIGH (ref 11.5–14.6)
WBC: 8.6 10*3/uL (ref 4.5–10.5)

## 2012-07-28 LAB — COMPREHENSIVE METABOLIC PANEL
ALT: 21 U/L (ref 0–35)
AST: 19 U/L (ref 0–37)
Albumin: 3.4 g/dL — ABNORMAL LOW (ref 3.5–5.2)
Alkaline Phosphatase: 71 U/L (ref 39–117)
BUN: 17 mg/dL (ref 6–23)
CO2: 26 mEq/L (ref 19–32)
Calcium: 8.7 mg/dL (ref 8.4–10.5)
Chloride: 102 mEq/L (ref 96–112)
Creatinine, Ser: 1.1 mg/dL (ref 0.4–1.2)
GFR: 51.06 mL/min — ABNORMAL LOW (ref >60.00)
Glucose, Bld: 128 mg/dL — ABNORMAL HIGH (ref 70–99)
Potassium: 3.7 mEq/L (ref 3.5–5.1)
Sodium: 137 mEq/L (ref 135–145)
Total Bilirubin: 0.5 mg/dL (ref 0.3–1.2)
Total Protein: 6.7 g/dL (ref 6.0–8.3)

## 2012-07-28 LAB — LIPID PANEL
Cholesterol: 201 mg/dL — ABNORMAL HIGH (ref 0–200)
HDL: 40 mg/dL (ref >39.00)
Total CHOL/HDL Ratio: 5
Triglycerides: 292 mg/dL — ABNORMAL HIGH (ref 0.0–149.0)
VLDL: 58.4 mg/dL — ABNORMAL HIGH (ref 0.0–40.0)

## 2012-07-28 LAB — HEMOGLOBIN A1C: Hgb A1c MFr Bld: 6 % (ref 4.6–6.5)

## 2012-07-28 LAB — TSH: TSH: 0.92 u[IU]/mL (ref 0.35–5.50)

## 2012-07-28 LAB — URIC ACID: Uric Acid, Serum: 7.9 mg/dL — ABNORMAL HIGH (ref 2.4–7.0)

## 2012-07-28 LAB — LDL CHOLESTEROL, DIRECT: Direct LDL: 115.6 mg/dL

## 2012-08-02 ENCOUNTER — Telehealth: Payer: Self-pay | Admitting: Gynecologic Oncology

## 2012-08-02 NOTE — Telephone Encounter (Signed)
Office Visit Note: Gyn-Onc  CC: Endometrial cancer.  Post op check.   Assessment/Plan:  [name]  is a 79 y.o.  with a FIGO Stage IA grade 1 endometrial adenocarcinoma.  She underwent TAH BSO small bowel resection and anastomosis performed by Dr. De Blanch on 07/04/2012.   F/U in 6 months F/U with Dr Charlesetta Garibaldi in 12 months    HPI: This is a delightful 79 y.o. gravida 5 para 2 last normal menstrual period in her 75s. For approximately one month prior to 04/15/2012 she noted vaginal spotting. On December 28 the bleeding was very heavy. She then presented to Dr. Lily Ramirez and a biopsy was collected on 05/25/2012 returned positive for grade 1 endometrial cancer.  She denies any weight loss she reports marked decrease in energy but denies any changes in her bowel or bladder habits.  A CT scan collected on 06/05/2012 was notable for the presence of a 4.2 x 5.3 cm exophytic left upper pole renal cyst there were no findings to suggest metastatic disease in the abdomen or pelvis. On 06/2012 she underwent TAH BSO small bowel resection.  Path 1. Uterus +/- tubes/ovaries, neoplastic, with cervix - ENDOMETRIOID ADENOCARCINOMA, FIGO GRADE I, ARISING IN A BACKGROUND OF ATYPICAL COMPLEX HYPERPLASIA, CONFINED WITHIN INNER HALF OF THE MYOMETRIUM. - MYOMETRIUM: ADENOMYOSIS. - CERVIX: BENIGN SQUAMOUS MUCOSA AND ENDOCERVICAL MUCOSA, NO DYSPLASIA OR MALIGNANCY. - BILATERAL OVARIES: BENIGN OVARIAN TISSUE WITH ENDOSALPINGIOSIS, NO ATYPIA OR MALIGNANCY. - BILATERAL FALLOPIAN TUBES: DILATED FALLOPIAN TUBAL TISSUE CONSISTENT WITH ENDOSALPINGIOSIS.NO ATYPIA OR MALIGNANCY.  No LVSI, minimal myometrial invasion.  2. Small intestine, resection - SEGMENT OF SMALL BOWEL WITH EXTENSIVE SEROSAL ADHESIONS AND HEMORRHAGE, NO ATYPIA OR MALIGNANCY.    Social Hx:   History   Social History  . Marital Status: Married    Spouse Name: [name]    Number of Children: [name]  . Years of Education: [name]   Occupational  History  . Not on file.   Social History Main Topics  . Smoking status: Never Smoker   . Smokeless tobacco: Never Used  . Alcohol Use: No  . Drug Use: No  . Sexually Active: No   Other Topics Concern  . Not on file   Social History Narrative  . No narrative on file    Past Surgical Hx:  Past Surgical History  Procedure Laterality Date  . Cholecystectomy    . Tubal ligation    . Knee arthroplasty  05/23/1998    total right  . Polyp removal  1999  . Hand surgery  12/2008    after hand fx/due to fall  . Joint replacement Bilateral 07/1998    knees  . Bone spur removed Right     shoulder  . Dilation and curettage of uterus  1999  . Eye surgery Bilateral     cataracts  . Laparotomy Bilateral 07/04/2012    Procedure: EXPLORATORY LAPAROTOMY TOTAL ABDOMINAL HYSTERECTOMY BILATERAL SALPINGO-OOPHORECTOMY with small bowel resection ;  Surgeon: Valerie Corpus, MD;  Location: [facility];  Service: Gynecology;  Laterality: Bilateral;  . Bowel resection  07/04/2012    Procedure: SMALL BOWEL RESECTION;  Surgeon: Valerie Corpus, MD;  Location: [facility];  Service: Gynecology;;    Past Medical Hx:  Past Medical History  Diagnosis Date  . Hypertension   . Hypothyroid   . Arthritis     OA  . Hyperlipidemia   . GERD (gastroesophageal reflux disease)   . Cancer     endometrial  Past Gynecological History:  G5P2 LNMP in 29's No h/o abnormal pap, Last pap 2014.  NSVD x 2.  OCP until 79 years old. BTL in 66's  Family Hx:  Family History  Problem Relation Age of Onset  . Heart disease Sister     CABG  . Stroke Sister   . Heart disease Brother     CABG  . Cancer Brother     LEUKEMIA  . Cancer Brother     LUNG  . Cancer Brother     STOMACH    Review of Systems:  Constitutional  Feels well, decreased energy no weight loss  Cardiovascular  No chest pain, shortness of breath, or edema  Pulmonary  No cough or wheeze.  Gastro Intestinal  No nausea, vomitting,  or diarrhoea. No bright red blood per rectum, no abdominal pain, change in bowel movement, or constipation.  Genito Urinary  Stress and urge incontinence for several years , reports vaginal spotting no vaginal discharge denies abdominal cramping. Musculo Skeletal  No myalgia, arthralgia, joint swelling or pain  Neurologic  No weakness, numbness, change in gait,  Psychology  No depression, anxiety, insomnia.   Vitals:  Blood pressure 186/80, pulse 64, temperature 97.6 F (36.4 C), temperature source Oral, resp. rate 24, height 5' 2.8" (1.595 m), weight 258 lb 4.8 oz (117.164 kg).  Physical Exam: WD in NAD, very upbeat and positive Neck  Supple NROM, without any enlargements.  Lymph Node Survey No cervical supraclavicular or inguinal adenopathy Cardiovascular  Pulse normal rate, regularity and rhythm. S1 and S2 normal.  Lungs  Clear to auscultation bilaterally. Skin  No rash/lesions/breakdown  Psychiatry  Alert and oriented to person, place, and time  Abdomen  Normoactive bowel sounds, abdomen soft, non-tender and obese. No ascites or palpable masses. Large cholecystectomy scar. Back No CVA tenderness Genito Urinary  Vulva/vagina: Normal external female genitalia.  No lesions.   Bladder/urethra: Grade 1 cystocele  Vagina: Very long atrophic trace blood noted in the vaginal vault  Cervix: Very difficult to visualize the cervix and spared a palpable however no gross lesions were noted  Uterus: Unable to assess size. The parametrium was too distant for palpation  Adnexa: No palpable masses. Rectal  Good tone, no masses no cul de sac nodularity.  Extremities  No bilateral cyanosis, clubbing or edema.  Valerie Schimke, MD, PhD 08/02/2012, 12:04 PM

## 2012-08-03 ENCOUNTER — Ambulatory Visit: Payer: Medicare Other | Attending: Gynecologic Oncology | Admitting: Gynecologic Oncology

## 2012-08-03 ENCOUNTER — Encounter: Payer: Self-pay | Admitting: Gynecologic Oncology

## 2012-08-03 VITALS — BP 150/80 | HR 78 | Temp 97.8°F | Resp 20 | Ht 62.8 in | Wt 253.7 lb

## 2012-08-03 DIAGNOSIS — C549 Malignant neoplasm of corpus uteri, unspecified: Secondary | ICD-10-CM | POA: Insufficient documentation

## 2012-08-03 DIAGNOSIS — C541 Malignant neoplasm of endometrium: Secondary | ICD-10-CM

## 2012-08-03 NOTE — Progress Notes (Signed)
Office Visit Note: Gyn-Onc  CC: Endometrial cancer.  Post op check.   Assessment/Plan:  Valerie Ramirez  is a 79 y.o.  with a FIGO Stage IA grade 1 endometrial adenocarcinoma.  She underwent TAH BSO small bowel resection and anastomosis performed by Dr. De Blanch on 07/04/2012.   F/U in 6 months with GYN oncology F/U with Dr Lily Peer in 12 months Pap tests with Dr. Lily Peer in one year Imaging and biomarkers based on clinical symptomatology.    HPI: This is a delightful 79 y.o. gravida 5 para 2 last normal menstrual period in her 16s. For approximately one month prior to 04/15/2012 she noted vaginal spotting. On December 28 the bleeding was very heavy. She then presented to Dr. Lily Peer and a biopsy was collected on 05/25/2012 returned positive for grade 1 endometrial cancer.  She denies any weight loss she reports marked decrease in energy but denies any changes in her bowel or bladder habits.  A CT scan collected on 06/05/2012 was notable for the presence of a 4.2 x 5.3 cm exophytic left upper pole renal cyst there were no findings to suggest metastatic disease in the abdomen or pelvis. On 06/2012 she underwent TAH BSO small bowel resection.  Path 1. Uterus +/- tubes/ovaries, neoplastic, with cervix - ENDOMETRIOID ADENOCARCINOMA, FIGO GRADE I, ARISING IN A BACKGROUND OF ATYPICAL COMPLEX HYPERPLASIA, CONFINED WITHIN INNER HALF OF THE MYOMETRIUM. - MYOMETRIUM: ADENOMYOSIS. - CERVIX: BENIGN SQUAMOUS MUCOSA AND ENDOCERVICAL MUCOSA, NO DYSPLASIA OR MALIGNANCY. - BILATERAL OVARIES: BENIGN OVARIAN TISSUE WITH ENDOSALPINGIOSIS, NO ATYPIA OR MALIGNANCY. - BILATERAL FALLOPIAN TUBES: DILATED FALLOPIAN TUBAL TISSUE CONSISTENT WITH ENDOSALPINGIOSIS.NO ATYPIA OR MALIGNANCY.  No LVSI, minimal myometrial invasion.  2. Small intestine, resection - SEGMENT OF SMALL BOWEL WITH EXTENSIVE SEROSAL ADHESIONS AND HEMORRHAGE, NO ATYPIA OR MALIGNANCY.   Her postop course was complicated by dyspnea  and chest pain. Cardiac enzymes CT scan of the chest and repeat EKG were without significant findings.  [name] is doing well she denies nausea vomiting or abdominal discomfort she denies vaginal bleeding  Social Hx:   History   Social History  . Marital Status: Married    Spouse Name: [name]    Number of Children: [name]  . Years of Education: [name]   Occupational History  . Not on file.   Social History Main Topics  . Smoking status: Never Smoker   . Smokeless tobacco: Never Used  . Alcohol Use: No  . Drug Use: No  . Sexually Active: No   Other Topics Concern  . Not on file   Social History Narrative  . No narrative on file    Past Surgical Hx:  Past Surgical History  Procedure Laterality Date  . Cholecystectomy    . Tubal ligation    . Knee arthroplasty  05/23/1998    total right  . Polyp removal  1999  . Hand surgery  12/2008    after hand fx/due to fall  . Joint replacement Bilateral 07/1998    knees  . Bone spur removed Right     shoulder  . Dilation and curettage of uterus  1999  . Eye surgery Bilateral     cataracts  . Laparotomy Bilateral 07/04/2012    Procedure: EXPLORATORY LAPAROTOMY TOTAL ABDOMINAL HYSTERECTOMY BILATERAL SALPINGO-OOPHORECTOMY with small bowel resection ;  Surgeon: Jeannette Corpus, MD;  Location: [facility];  Service: Gynecology;  Laterality: Bilateral;  . Bowel resection  07/04/2012    Procedure: SMALL BOWEL RESECTION;  Surgeon: Reuel Boom  Michail Jewels, MD;  Location: [facility];  Service: Gynecology;;    Past Medical Hx:  Past Medical History  Diagnosis Date  . Hypertension   . Hypothyroid   . Arthritis     OA  . Hyperlipidemia   . GERD (gastroesophageal reflux disease)   . Cancer     endometrial    Past Gynecological History:  G5P2 LNMP in 54's No h/o abnormal pap, Last pap 2014.  NSVD x 2.  OCP until 79 years old. BTL in 57's  Family Hx:  Family History  Problem Relation Age of Onset  . Heart disease Sister     CABG  .  Stroke Sister   . Heart disease Brother     CABG  . Cancer Brother     LEUKEMIA  . Cancer Brother     LUNG  . Cancer Brother     STOMACH    Review of Systems:  Constitutional  Feels well, Cardiovascular  No chest pain, shortness of breath, or edema  Pulmonary  No cough or wheeze.  Gastro Intestinal  No nausea, vomitting, or diarrhoea.   Genito Urinary  Stress and urge incontinence for several years , no vaginal bleeding or discharge Musculo Skeletal  No myalgia, arthralgia, joint swelling or pain  Neurologic  No weakness, numbness, change in gait,  Psychology  No depression, anxiety, insomnia. 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 rate 20, height 5' 2.8" (1.595 m), weight 253 lb 11.2 oz (115.078 kg). Body mass index is 45.23 kg/(m^2).   Physical Exam: WD in NAD, very upbeat and positive Neck  Supple NROM, without any enlargements.  Lymph Node Survey No cervical supraclavicular or inguinal adenopathy Cardiovascular  Pulse normal rate, regularity and rhythm. S1 and S2 normal.  Lungs  Clear to auscultation bilaterally. Psychiatry  Alert and oriented to person, place, and time  Abdomen  Normoactive bowel sounds, abdomen soft, non-tender and obese. Midline incision healing well, ecchymosis at several sites sites over the abdomen.  Superficial skin separation at the midportion of the incision and silver nitrate was applied Back No CVA tenderness Genito Urinary  Vulva/vagina: Normal external female genitalia. Cuff intact no vaginal bleeding or discharge Good tone, no masses no cul de sac nodularity.  Extremities  No bilateral cyanosis, clubbing or edema.  Valerie Schimke, MD, PhD 08/03/2012, 11:32 AM

## 2012-08-03 NOTE — Patient Instructions (Signed)
Stage IA grade 1 endometrial adenocarcinoma.   F/U in 6 months with GYN oncology Followup with Dr. Lily Peer in one year Pap tests with Dr. Lily Peer in one year

## 2012-08-04 ENCOUNTER — Encounter: Payer: Medicare Other | Admitting: Family Medicine

## 2012-08-08 ENCOUNTER — Encounter: Payer: Self-pay | Admitting: Family Medicine

## 2012-08-08 ENCOUNTER — Ambulatory Visit (INDEPENDENT_AMBULATORY_CARE_PROVIDER_SITE_OTHER): Payer: Medicare Other | Admitting: Family Medicine

## 2012-08-08 VITALS — BP 146/84 | HR 62 | Temp 98.4°F | Ht 62.5 in | Wt 252.5 lb

## 2012-08-08 DIAGNOSIS — E669 Obesity, unspecified: Secondary | ICD-10-CM | POA: Diagnosis not present

## 2012-08-08 DIAGNOSIS — E039 Hypothyroidism, unspecified: Secondary | ICD-10-CM

## 2012-08-08 DIAGNOSIS — Z1231 Encounter for screening mammogram for malignant neoplasm of breast: Secondary | ICD-10-CM | POA: Diagnosis not present

## 2012-08-08 DIAGNOSIS — R7309 Other abnormal glucose: Secondary | ICD-10-CM

## 2012-08-08 DIAGNOSIS — Z0000 Encounter for general adult medical examination without abnormal findings: Secondary | ICD-10-CM | POA: Diagnosis not present

## 2012-08-08 DIAGNOSIS — I10 Essential (primary) hypertension: Secondary | ICD-10-CM | POA: Diagnosis not present

## 2012-08-08 DIAGNOSIS — E7800 Pure hypercholesterolemia, unspecified: Secondary | ICD-10-CM

## 2012-08-08 DIAGNOSIS — R739 Hyperglycemia, unspecified: Secondary | ICD-10-CM

## 2012-08-08 MED ORDER — DOXAZOSIN MESYLATE 8 MG PO TABS: 8.0000 mg | ORAL_TABLET | Freq: Every day | ORAL | Status: DC

## 2012-08-08 MED ORDER — METOPROLOL TARTRATE 100 MG PO TABS: 100.0000 mg | ORAL_TABLET | Freq: Two times a day (BID) | ORAL | Status: DC

## 2012-08-08 MED ORDER — LISINOPRIL 5 MG PO TABS: 5.0000 mg | ORAL_TABLET | Freq: Every day | ORAL | Status: DC

## 2012-08-08 MED ORDER — AMLODIPINE BESYLATE 5 MG PO TABS: 5.0000 mg | ORAL_TABLET | Freq: Every day | ORAL | Status: DC

## 2012-08-08 NOTE — Assessment & Plan Note (Signed)
Discussed how this problem influences overall health and the risks it imposes  Reviewed plan for weight loss with lower calorie diet (via better food choices and also portion control or program like weight watchers) and exercise building up to or more than 30 minutes 5 days per week including some aerobic activity   Unsure if pt is motivated or concerned

## 2012-08-08 NOTE — Patient Instructions (Addendum)
Don't forget to make your mammogram appt If you are interested in a shingles/zoster vaccine - call your insurance to check on coverage,( you should not get it within 1 month of other vaccines) , then call us for a prescription  for it to take to a pharmacy that gives the shot , or make a nurse visit to get it here depending on your coverage For iron deficiency after surgery-take a multi vitamin daily with iron please

## 2012-08-08 NOTE — Assessment & Plan Note (Signed)
tsh has been tx without tx  No symptoms  Past hx of goiter Will continue to follow

## 2012-08-08 NOTE — Assessment & Plan Note (Signed)
Disc goals for lipids and reasons to control them Rev labs with pt Rev low sat fat diet in detail   

## 2012-08-08 NOTE — Progress Notes (Signed)
Subjective:    Patient ID: Valerie Ramirez, female    DOB: [birth year], 79 y.o.   MRN: [id number]  HPI I have personally reviewed the Medicare Annual Wellness questionnaire and have noted 1. The patient's medical and social history 2. Their use of alcohol, tobacco or illicit drugs 3. Their current medications and supplements 4. The patient's functional ability including ADL's, fall risks, home safety risks and hearing or visual             impairment. 5. Diet and physical activities 6. Evidence for depression or mood disorders  The patients weight, height, BMI have been recorded in the chart and visual acuity is per eye clinic.  I have made referrals, counseling and provided education to the patient based review of the above and I have provided the pt with a written personalized care plan for preventive services.  Pt had total hyst and partial small bowel resection for endometrial carcinoma No follow up treatments needed  She is a little tired overall-not bad at all  Her biggest problem is still back pain  Lab Results  Component Value Date   WBC 8.6 07/28/2012   HGB 10.2* 07/28/2012   HCT 30.7* 07/28/2012   MCV 88.2 07/28/2012   PLT 206.0 07/28/2012    Wt is down 1 lb with bmi of 45 Diet/exercise- not able to exercise much due to back  Will have back surgery in the future- Dr Ophelia Charter   See scanned forms.  Routine anticipatory guidance given to patient.  See health maintenance. Flu 10/13 vaccine Shingles status- never had the vaccine (never had chicken pox) PNA-3/14 vacc Tetanus 7/13 imm Colon cancer screen -has not had one -not ready for one yet (had partial small bowel resection from surgery) Breast cancer screening mammo 2/12- will make her own appt  Self exam-no lumps or changes   Advance directive--she has that drawn up - living will and POA  Cognitive function addressed- see scanned forms- and if abnormal then additional documentation follows.  No problems at all   PMH and  SH reviewed  Meds, vitals, and allergies reviewed.   dexa 6/01 normal Ca and D  bp is stable today  No cp or palpitations or headaches or edema  No side effects to medicines  BP Readings from Last 3 Encounters:  08/08/12 146/84  08/03/12 150/80  07/13/12 122/62     Hyperlipidemia Lab Results  Component Value Date   CHOL 201* 07/28/2012   CHOL 185 10/26/2011   CHOL 194 03/24/2011   Lab Results  Component Value Date   HDL 40.00 07/28/2012   HDL 65.78 10/26/2011   HDL 48.60 03/24/2011   Lab Results  Component Value Date   LDLCALC 109* 10/26/2011   LDLCALC 124* 03/24/2011   LDLCALC 87 03/17/2010   Lab Results  Component Value Date   TRIG 292.0* 07/28/2012   TRIG 132.0 10/26/2011   TRIG 108.0 03/24/2011   Lab Results  Component Value Date   CHOLHDL 5 07/28/2012   CHOLHDL 4 10/26/2011   CHOLHDL 4 03/24/2011   Lab Results  Component Value Date   LDLDIRECT 115.6 07/28/2012   LDLDIRECT 139.5 10/05/2010   LDLDIRECT 143.9 01/21/2010   diet - is pretty good -she stays away from fatty foods   Hypothyroid in past with goiter Lab Results  Component Value Date   TSH 0.92 07/28/2012    Gout-on colchicine-has not bothered her   Lab Results  Component Value Date   HGBA1C 6.0 07/28/2012  hyperglycemia is stable  -mood is fine -no depression or lack of motivation  She is excited to get back to regular activity- mow the yard   Falls - occ trips with L leg (foot drop) from back pain (tends to trip only when going up stairs) Has a cane-not too helpful   Review of Systems Review of Systems  Constitutional: Negative for fever, appetite change, fatigue and unexpected weight change.  Eyes: Negative for pain and visual disturbance.  Respiratory: Negative for cough and shortness of breath.   Cardiovascular: Negative for cp or palpitations    Gastrointestinal: Negative for nausea, diarrhea and constipation.  Genitourinary: Negative for urgency and frequency.  Skin: Negative for pallor or  rash   MSK pos for chronic back pain, also general aches and pains  Neurological: Negative for weakness, light-headedness, numbness and headaches.  Hematological: Negative for adenopathy. Does not bruise/bleed easily.  Psychiatric/Behavioral: Negative for dysphoric mood. The patient is not nervous/anxious.         Objective:   Physical Exam  Constitutional: She appears well-developed and well-nourished. No distress.  obese and well appearing   HENT:  Head: Normocephalic and atraumatic.  Right Ear: External ear normal.  Left Ear: External ear normal.  Nose: Nose normal.  Mouth/Throat: Oropharynx is clear and moist.  Eyes: Conjunctivae and EOM are normal. Pupils are equal, round, and reactive to light. Right eye exhibits no discharge. No scleral icterus.  Neck: Normal range of motion. Neck supple. No JVD present. Carotid bruit is not present. No thyromegaly present.  Cardiovascular: Normal rate, regular rhythm, normal heart sounds and intact distal pulses.  Exam reveals no gallop.   Pulmonary/Chest: Effort normal and breath sounds normal. No respiratory distress. She has no wheezes. She has no rales.  No crackles   Abdominal: Soft. Bowel sounds are normal. She exhibits no distension, no abdominal bruit and no mass. There is no tenderness.  Genitourinary: No breast swelling, tenderness, discharge or bleeding.  Breast exam: No mass, nodules, thickening, tenderness, bulging, retraction, inflamation, nipple discharge or skin changes noted.  No axillary or clavicular LA.  Chaperoned exam.    Musculoskeletal: She exhibits no edema and no tenderness.  Poor rom TS and LS  Lymphadenopathy:    She has no cervical adenopathy.  Neurological: She is alert. She has normal reflexes. She displays no atrophy and no tremor. No cranial nerve deficit. She exhibits normal muscle tone. Coordination normal.  Skin: Skin is warm and dry. No rash noted. No erythema. No pallor.  Healing abd/pelvic incision  without signs of infection   Psychiatric: She has a normal mood and affect.          Assessment & Plan:

## 2012-08-08 NOTE — Assessment & Plan Note (Signed)
bp in fair control at this time  No changes needed  Disc lifstyle change with low sodium diet and exercise  Labs reviewed  

## 2012-08-08 NOTE — Assessment & Plan Note (Signed)
Pt will schedule own mammogram  Enc monthly self exams  Nl exam today

## 2012-08-08 NOTE — Assessment & Plan Note (Signed)
Stable Lab Results  Component Value Date   HGBA1C 6.0 07/28/2012   disc need for low glycemic diet and wt loss to prevent DM

## 2012-08-08 NOTE — Assessment & Plan Note (Signed)
Reviewed health habits including diet and exercise and skin cancer prevention Also reviewed health mt list, fam hx and immunizations  See HPI Rev labs and antic guidance  Enc to schedule her annual mammogram

## 2012-08-31 ENCOUNTER — Ambulatory Visit: Payer: Self-pay | Admitting: Family Medicine

## 2012-08-31 DIAGNOSIS — Z1231 Encounter for screening mammogram for malignant neoplasm of breast: Secondary | ICD-10-CM | POA: Diagnosis not present

## 2012-09-04 ENCOUNTER — Encounter: Payer: Self-pay | Admitting: Family Medicine

## 2012-09-05 ENCOUNTER — Encounter: Payer: Self-pay | Admitting: *Deleted

## 2012-09-14 ENCOUNTER — Other Ambulatory Visit: Payer: Self-pay | Admitting: Family Medicine

## 2012-09-21 ENCOUNTER — Other Ambulatory Visit: Payer: Self-pay | Admitting: Family Medicine

## 2012-09-28 ENCOUNTER — Other Ambulatory Visit: Payer: Self-pay | Admitting: Family Medicine

## 2012-10-02 ENCOUNTER — Ambulatory Visit (INDEPENDENT_AMBULATORY_CARE_PROVIDER_SITE_OTHER): Payer: Medicare Other | Admitting: Family Medicine

## 2012-10-02 ENCOUNTER — Ambulatory Visit (INDEPENDENT_AMBULATORY_CARE_PROVIDER_SITE_OTHER)
Admission: RE | Admit: 2012-10-02 | Discharge: 2012-10-02 | Disposition: A | Discharge status: Home / Self Care | Payer: Medicare Other | Source: Ambulatory Visit | Attending: Family Medicine | Admitting: Family Medicine

## 2012-10-02 ENCOUNTER — Encounter: Payer: Self-pay | Admitting: Family Medicine

## 2012-10-02 VITALS — BP 142/82 | HR 72 | Temp 98.2°F | Wt 251.0 lb

## 2012-10-02 DIAGNOSIS — M79609 Pain in unspecified limb: Secondary | ICD-10-CM | POA: Diagnosis not present

## 2012-10-02 DIAGNOSIS — M25571 Pain in right ankle and joints of right foot: Secondary | ICD-10-CM

## 2012-10-02 DIAGNOSIS — M25579 Pain in unspecified ankle and joints of unspecified foot: Secondary | ICD-10-CM

## 2012-10-02 DIAGNOSIS — M7730 Calcaneal spur, unspecified foot: Secondary | ICD-10-CM | POA: Diagnosis not present

## 2012-10-02 DIAGNOSIS — M79671 Pain in right foot: Secondary | ICD-10-CM

## 2012-10-02 MED ORDER — MELOXICAM 7.5 MG PO TABS: 7.5000 mg | ORAL_TABLET | Freq: Every day | ORAL | Status: DC

## 2012-10-02 MED ORDER — DICLOFENAC SODIUM 1 % TD GEL: 2.0000 g | Freq: Four times a day (QID) | TRANSDERMAL | Status: DC

## 2012-10-02 NOTE — Patient Instructions (Addendum)
Use the gel and if that doesn't help, use meloxicam with food.  Ice your foot several times a day.  Limit weight bearing.  We'll go from there. Take care.

## 2012-10-02 NOTE — Progress Notes (Signed)
R foot pain.  Pain walking. Swelling in the foot noted. Throbbing.  Started hurting last week, worse in the meantime.  No injury, no trauma.  Doesn't feel similar to gout paint.  Pain on dorsum of midfoot.  Pain less when not weightbearing;  more pain with weightbearing.  No L foot pain.   Meds, vitals, and allergies reviewed.   ROS: See HPI.  Otherwise, noncontributory.  nad Obese Walking with limp due to R foot pain.  R foot w/o bruising but diffusely puffy w/o pitting on the dorsum ttp anterior to medial mal and dorsal midfoot Pain with extensor tendon testing but no tendon deficit NV intact distally  xrays reviewed.

## 2012-10-03 DIAGNOSIS — M79673 Pain in unspecified foot: Secondary | ICD-10-CM | POA: Insufficient documentation

## 2012-10-03 NOTE — Assessment & Plan Note (Signed)
Likely tendonitis, didn't tolerate CAM walker. Would use rest, ice, elevation, voltaren gel.  If not better, then use meloxicam with GI caution.  F/u with PCP o/w.  She agrees.  Xrays d/w pt.

## 2012-10-12 ENCOUNTER — Other Ambulatory Visit: Payer: Self-pay | Admitting: Family Medicine

## 2012-10-12 MED ORDER — COLCHICINE 0.6 MG PO TABS: 0.6000 mg | ORAL_TABLET | Freq: Every day | ORAL | Status: DC

## 2012-10-12 NOTE — Addendum Note (Signed)
Addended by: Shon Millet on: 10/12/2012 10:39 AM   Modules accepted: Orders

## 2012-11-10 DIAGNOSIS — H2510 Age-related nuclear cataract, unspecified eye: Secondary | ICD-10-CM | POA: Diagnosis not present

## 2012-11-10 DIAGNOSIS — Z961 Presence of intraocular lens: Secondary | ICD-10-CM | POA: Diagnosis not present

## 2012-11-14 DIAGNOSIS — M545 Low back pain, unspecified: Secondary | ICD-10-CM | POA: Diagnosis not present

## 2012-11-14 DIAGNOSIS — M48061 Spinal stenosis, lumbar region without neurogenic claudication: Secondary | ICD-10-CM | POA: Diagnosis not present

## 2012-11-21 DIAGNOSIS — M4310 Spondylolisthesis, site unspecified: Secondary | ICD-10-CM | POA: Diagnosis not present

## 2012-11-21 DIAGNOSIS — M48061 Spinal stenosis, lumbar region without neurogenic claudication: Secondary | ICD-10-CM | POA: Diagnosis not present

## 2012-11-21 DIAGNOSIS — M545 Low back pain, unspecified: Secondary | ICD-10-CM | POA: Diagnosis not present

## 2012-11-23 DIAGNOSIS — M545 Low back pain, unspecified: Secondary | ICD-10-CM | POA: Diagnosis not present

## 2012-11-23 DIAGNOSIS — M48061 Spinal stenosis, lumbar region without neurogenic claudication: Secondary | ICD-10-CM | POA: Diagnosis not present

## 2012-12-05 DIAGNOSIS — M545 Low back pain, unspecified: Secondary | ICD-10-CM | POA: Diagnosis not present

## 2012-12-05 DIAGNOSIS — M48061 Spinal stenosis, lumbar region without neurogenic claudication: Secondary | ICD-10-CM | POA: Diagnosis not present

## 2012-12-07 DIAGNOSIS — R209 Unspecified disturbances of skin sensation: Secondary | ICD-10-CM | POA: Diagnosis not present

## 2012-12-07 DIAGNOSIS — M79609 Pain in unspecified limb: Secondary | ICD-10-CM | POA: Diagnosis not present

## 2012-12-19 DIAGNOSIS — M545 Low back pain, unspecified: Secondary | ICD-10-CM | POA: Diagnosis not present

## 2012-12-19 DIAGNOSIS — M48061 Spinal stenosis, lumbar region without neurogenic claudication: Secondary | ICD-10-CM | POA: Diagnosis not present

## 2013-01-09 ENCOUNTER — Ambulatory Visit (INDEPENDENT_AMBULATORY_CARE_PROVIDER_SITE_OTHER): Payer: Self-pay

## 2013-01-09 ENCOUNTER — Ambulatory Visit (INDEPENDENT_AMBULATORY_CARE_PROVIDER_SITE_OTHER): Payer: Medicare Other | Admitting: Neurology

## 2013-01-09 DIAGNOSIS — M79609 Pain in unspecified limb: Secondary | ICD-10-CM

## 2013-01-09 DIAGNOSIS — M545 Low back pain, unspecified: Secondary | ICD-10-CM | POA: Diagnosis not present

## 2013-01-09 DIAGNOSIS — M4310 Spondylolisthesis, site unspecified: Secondary | ICD-10-CM

## 2013-01-09 DIAGNOSIS — Z0289 Encounter for other administrative examinations: Secondary | ICD-10-CM

## 2013-01-09 DIAGNOSIS — G609 Hereditary and idiopathic neuropathy, unspecified: Secondary | ICD-10-CM

## 2013-01-09 NOTE — Progress Notes (Signed)
Right median, ulnar sensory responses were normal. Bilateral tibial H. reflexes were present and symmetric.

## 2013-01-09 NOTE — Procedures (Signed)
    [facility]  NCS (NERVE CONDUCTION STUDY) WITH EMG (ELECTROMYOGRAPHY) REPORT   STUDY DATE: 01/09/2013 PATIENT NAME: Valerie Ramirez DOB: Dec 14, 1932 MRN: [id number]    TECHNOLOGIST: Gearldine Shown ELECTROMYOGRAPHER: Levert Feinstein M.D.  CLINICAL INFORMATION:  79 years old right-handed Caucasian female, with past medical history of bilaterally knee replacement, presenting with 20 year history of low back pain, radiating to right hip, occasionally right leg.  On examination: Bilateral lower extremity motor strength was normal, mild length dependent sensory changes, decreased light touch and pinprick to ankle levels, absent bilateral lower extremity deep tendon reflexes.  FINDINGS:  NERVE CONDUCTION STUDY:  Bilateral peroneal, sural sensory responses were absent. Bilateral peroneal to EDB, and the tibial motor responses were normal  NEEDLE ELECTROMYOGRAPHY: Selected needle examination was performed at right lower extremity muscles, and right lumbosacral paraspinal muscles.  Needle examination of right tibialis anterior, tibialis posterior, peroneal longus, biceps femoris long head, vastus lateralis was normal.  There was no spontaneous activity at right lumbosacral paraspinal muscles, right L4, L5, S1.  IMPRESSION:  This is a mild abnormal study. 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   INTERPRETING PHYSICIAN:   Levert Feinstein M.D. Ph.D. [facility] [address], [address] [place], [place] [address] [phone]

## 2013-02-08 ENCOUNTER — Other Ambulatory Visit: Payer: Self-pay | Admitting: Family Medicine

## 2013-02-09 ENCOUNTER — Other Ambulatory Visit (HOSPITAL_COMMUNITY)
Admission: RE | Admit: 2013-02-09 | Discharge: 2013-02-09 | Disposition: A | Discharge status: Home / Self Care | Payer: Medicare Other | Source: Ambulatory Visit | Attending: Gynecology | Admitting: Gynecology

## 2013-02-09 ENCOUNTER — Ambulatory Visit: Payer: Medicare Other | Attending: Gynecology | Admitting: Gynecology

## 2013-02-09 ENCOUNTER — Encounter: Payer: Self-pay | Admitting: Gynecology

## 2013-02-09 ENCOUNTER — Encounter (INDEPENDENT_AMBULATORY_CARE_PROVIDER_SITE_OTHER): Payer: Self-pay

## 2013-02-09 VITALS — BP 180/80 | HR 71 | Temp 97.9°F | Resp 20 | Ht 62.8 in | Wt 243.8 lb

## 2013-02-09 DIAGNOSIS — Z90710 Acquired absence of both cervix and uterus: Secondary | ICD-10-CM | POA: Insufficient documentation

## 2013-02-09 DIAGNOSIS — Z124 Encounter for screening for malignant neoplasm of cervix: Secondary | ICD-10-CM | POA: Diagnosis not present

## 2013-02-09 DIAGNOSIS — C549 Malignant neoplasm of corpus uteri, unspecified: Secondary | ICD-10-CM | POA: Insufficient documentation

## 2013-02-09 DIAGNOSIS — Z9079 Acquired absence of other genital organ(s): Secondary | ICD-10-CM | POA: Diagnosis not present

## 2013-02-09 DIAGNOSIS — C541 Malignant neoplasm of endometrium: Secondary | ICD-10-CM

## 2013-02-09 NOTE — Addendum Note (Signed)
Addended by: Warner Mccreedy D on: 02/09/2013 11:12 AM   Modules accepted: Orders

## 2013-02-09 NOTE — Progress Notes (Signed)
Consult Note: Gyn-Onc   Valerie Ramirez 79 y.o. female  Chief Complaint  Patient presents with  . Endometrial Cancer    Follow up    Assessment : Stage IA grade 1 endometrial adenocarcinoma March 2014. Clinically free of disease.  Plan: Pap smears are obtained. The patient will see Dr. Reynaldo Ramirez in April 2015 and return to see [place] in one year.  Interval History: The patient returns today as previously scheduled. Since her last visit she's done well. She denies any GI GU or pelvic symptoms. She specifically denies any pelvic pain pressure vaginal bleeding or discharge. Her functional status is excellent. She and her family are currently in the midst of making molasses.    HPI: Patient underwent a total abdominal hysterectomy bilateral salpingo-oophorectomy and small bowel resection with re\re anastomoses on 07/04/2012. Final pathology showed that she had a stage IA grade 1 endometrial carcinoma. Small bowel resection was performed because of severe adhesions. She had an uncomplicated postoperative course.  Review of Systems:10 point review of systems is negative except as noted in interval history.   Vitals: Blood pressure 180/80, pulse 71, temperature 97.9 F (36.6 C), temperature source Oral, resp. rate 20, height 5' 2.8" (1.595 m), weight 243 lb 12.8 oz (110.587 kg).  Physical Exam: General : The patient is a healthy woman in no acute distress.  HEENT: normocephalic, extraoccular movements normal; neck is supple without thyromegally  Lynphnodes: Supraclavicular and inguinal nodes not enlarged  Abdomen: Soft, non-tender, no ascites, no organomegally, no masses, no hernias  Pelvic:  EGBUS: Normal female  Vagina: Normal, no lesions  Urethra and Bladder: Normal, non-tender  Cervix: Surgically absent  Uterus: Surgically absent  Bi-manual examination: Non-tender; no adenxal masses or nodularity  Rectal: normal sphincter tone, no masses, no blood  Lower extremities: No edema or  varicosities. Normal range of motion      Allergies  Allergen Reactions  . Amoxicillin-Pot Clavulanate     REACTION: stomach upset  . Lipitor [Atorvastatin Calcium] Other (See Comments)    Leg ache and ache all over  . Simvastatin     REACTION: muscle pain  . Tape Itching and Rash    "Only the paper tape"    Past Medical History  Diagnosis Date  . Hypertension   . Hypothyroid   . Arthritis     OA  . Hyperlipidemia   . GERD (gastroesophageal reflux disease)   . Cancer     endometrial    Past Surgical History  Procedure Laterality Date  . Cholecystectomy    . Tubal ligation    . Knee arthroplasty  05/23/1998    total right  . Polyp removal  1999  . Hand surgery  12/2008    after hand fx/due to fall  . Joint replacement Bilateral 07/1998    knees  . Bone spur removed Right     shoulder  . Dilation and curettage of uterus  1999  . Eye surgery Bilateral     cataracts  . Laparotomy Bilateral 07/04/2012    Procedure: EXPLORATORY LAPAROTOMY TOTAL ABDOMINAL HYSTERECTOMY BILATERAL SALPINGO-OOPHORECTOMY with small bowel resection ;  Surgeon: Jeannette Corpus, MD;  Location: [facility];  Service: Gynecology;  Laterality: Bilateral;  . Bowel resection  07/04/2012    Procedure: SMALL BOWEL RESECTION;  Surgeon: Jeannette Corpus, MD;  Location: [facility];  Service: Gynecology;;    Current Outpatient Prescriptions  Medication Sig Dispense Refill  . amLODipine (NORVASC) 5 MG tablet Take 1 tablet (5 mg  total) by mouth daily.  90 tablet  3  . COLCRYS 0.6 MG tablet TAKE 1 TABLET (0.6 MG TOTAL) BY MOUTH DAILY. AS NEEDED FOR GOUT PAIN.  20 tablet  1  . doxazosin (CARDURA) 8 MG tablet Take 1 tablet (8 mg total) by mouth at bedtime.  90 tablet  3  . lisinopril (PRINIVIL,ZESTRIL) 5 MG tablet Take 1 tablet (5 mg total) by mouth at bedtime.  90 tablet  3  . metoprolol (LOPRESSOR) 100 MG tablet Take 1 tablet (100 mg total) by mouth 2 (two) times daily.  180 tablet  3  .  cyanocobalamin 500 MCG tablet Take by mouth as directed.       . diclofenac sodium (VOLTAREN) 1 % GEL Apply 2 g topically 4 (four) times daily.  100 g  1  . meloxicam (MOBIC) 7.5 MG tablet Take 1 tablet (7.5 mg total) by mouth daily.  30 tablet  0   No current facility-administered medications for this visit.    History   Social History  . Marital Status: Married    Spouse Name: [name]    Number of Children: [name]  . Years of Education: [name]   Occupational History  . Not on file.   Social History Main Topics  . Smoking status: Never Smoker   . Smokeless tobacco: Never Used  . Alcohol Use: No  . Drug Use: No  . Sexual Activity: No   Other Topics Concern  . Not on file   Social History Narrative  . No narrative on file    Family History  Problem Relation Age of Onset  . Heart disease Sister     CABG  . Stroke Sister   . Heart disease Brother     CABG  . Cancer Brother     LEUKEMIA  . Cancer Brother     LUNG  . Cancer Brother     STOMACH      Jeannette Corpus, MD 02/09/2013, 9:54 AM

## 2013-02-09 NOTE — Patient Instructions (Signed)
Return to see [place] in one year. We will contact you if the Pap smear report.

## 2013-02-14 ENCOUNTER — Telehealth: Payer: Self-pay | Admitting: Gynecologic Oncology

## 2013-02-14 NOTE — Telephone Encounter (Signed)
Pt notified about pap results: negative.  No questions or concerns voiced. 

## 2013-02-15 DIAGNOSIS — Z23 Encounter for immunization: Secondary | ICD-10-CM | POA: Diagnosis not present

## 2013-04-10 ENCOUNTER — Ambulatory Visit (INDEPENDENT_AMBULATORY_CARE_PROVIDER_SITE_OTHER)
Admission: RE | Admit: 2013-04-10 | Discharge: 2013-04-10 | Disposition: A | Discharge status: Home / Self Care | Payer: Medicare Other | Source: Ambulatory Visit | Attending: Family Medicine | Admitting: Family Medicine

## 2013-04-10 ENCOUNTER — Ambulatory Visit (INDEPENDENT_AMBULATORY_CARE_PROVIDER_SITE_OTHER): Payer: Medicare Other | Admitting: Family Medicine

## 2013-04-10 ENCOUNTER — Encounter: Payer: Self-pay | Admitting: Family Medicine

## 2013-04-10 VITALS — BP 134/84 | HR 75 | Temp 97.9°F | Ht 62.75 in | Wt 236.5 lb

## 2013-04-10 DIAGNOSIS — M109 Gout, unspecified: Secondary | ICD-10-CM

## 2013-04-10 DIAGNOSIS — M19029 Primary osteoarthritis, unspecified elbow: Secondary | ICD-10-CM | POA: Diagnosis not present

## 2013-04-10 DIAGNOSIS — Z9181 History of falling: Secondary | ICD-10-CM

## 2013-04-10 LAB — CBC WITH DIFFERENTIAL/PLATELET
Basophils Absolute: 0 10*3/uL (ref 0.0–0.1)
Basophils Relative: 0.4 % (ref 0.0–3.0)
Eosinophils Absolute: 0.2 10*3/uL (ref 0.0–0.7)
Eosinophils Relative: 1.6 % (ref 0.0–5.0)
HCT: 36.1 % (ref 36.0–46.0)
Hemoglobin: 11.8 g/dL — ABNORMAL LOW (ref 12.0–15.0)
Lymphocytes Relative: 19.6 % (ref 12.0–46.0)
Lymphs Abs: 2 10*3/uL (ref 0.7–4.0)
MCHC: 32.7 g/dL (ref 30.0–36.0)
MCV: 80.9 fl (ref 78.0–100.0)
Monocytes Absolute: 0.9 10*3/uL (ref 0.1–1.0)
Monocytes Relative: 8.9 % (ref 3.0–12.0)
Neutro Abs: 7 10*3/uL (ref 1.4–7.7)
Neutrophils Relative %: 69.5 % (ref 43.0–77.0)
Platelets: 239 10*3/uL (ref 150.0–400.0)
RBC: 4.47 Mil/uL (ref 3.87–5.11)
RDW: 17.1 % — ABNORMAL HIGH (ref 11.5–14.6)
WBC: 10.1 10*3/uL (ref 4.5–10.5)

## 2013-04-10 LAB — URIC ACID: Uric Acid, Serum: 9.7 mg/dL — ABNORMAL HIGH (ref 2.4–7.0)

## 2013-04-10 MED ORDER — COLCHICINE 0.6 MG PO TABS: 0.6000 mg | ORAL_TABLET | Freq: Two times a day (BID) | ORAL | Status: DC

## 2013-04-10 NOTE — Progress Notes (Signed)
Pre-visit discussion using our clinic review tool. No additional management support is needed unless otherwise documented below in the visit note.  

## 2013-04-10 NOTE — Progress Notes (Signed)
Subjective:    Patient ID: Valerie Ramirez, female    DOB: 1933-04-02, 79 y.o.   MRN: [id number]  HPI Here for check after a fall the Saturday after thanksgiving -- [place] - mis[place]pped coming off stairs and fell backwards on her buttocks / did hit head lightly but that has been fine   This is her first fall  She thinks her balance is fine  Declines need for walker or cane at this time   R elbow started hurting this weekend  Very painful / sharp pain  No injury or repeditive task  Hurts to straighten it  Not hurt in previous fall  Is a little swelling  No numbness   L foot pain - has a toe that turned red and hurts - (has a hx of gout)  Is her "middle" toe  This started over the weekend as well  Took a gout pill - colchicine (only took the 1   Is on cymbalta from orthopedics for her chronic pain - it has really helped her chronic leg and back pain   Patient Active Problem List   Diagnosis Date Noted  . Foot pain 10/03/2012  . Encounter for Medicare annual wellness exam 08/08/2012  . Gout 07/14/2012  . Endometrial cancer 05/25/2012  . Degenerative joint disease of cervical spine 05/03/2011  . Other screening mammogram 03/31/2011  . Neck pain 03/31/2011  . Thoracic back pain 03/31/2011  . NECK PAIN 02/11/2010  . HEADACHE 02/11/2010  . Hyperglycemia 09/26/2007  . OBESITY 07/12/2007  . GOITER, MULTINODULAR 07/11/2007  . HYPERTENSION 07/11/2007  . CARDIOMYOPATHY 07/11/2007  . OSTEOARTHRITIS 07/11/2007  . LEG EDEMA 07/11/2007  . 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 LIVER FUNCTION TESTS, ABNORMAL 07/11/2007  . HYPOTHYROIDISM NOS 10/05/2006  . HYPERCHOLESTEROLEMIA, PURE 10/05/2006   Past Medical History  Diagnosis Date  . Hypertension   . Hypothyroid   . Arthritis     OA  . Hyperlipidemia   . GERD (gastroesophageal reflux disease)   . Cancer     endometrial   Past Surgical History  Procedure Laterality Date  . Cholecystectomy    . Tubal  ligation    . Knee arthroplasty  05/23/1998    total right  . Polyp removal  1999  . Hand surgery  12/2008    after hand fx/due to fall  . Joint replacement Bilateral 07/1998    knees  . Bone spur removed Right     shoulder  . Dilation and curettage of uterus  1999  . Eye surgery Bilateral     cataracts  . Laparotomy Bilateral 07/04/2012    Procedure: EXPLORATORY LAPAROTOMY TOTAL ABDOMINAL HYSTERECTOMY BILATERAL SALPINGO-OOPHORECTOMY with small bowel resection ;  Surgeon: Jeannette Corpus, MD;  Location: [facility];  Service: Gynecology;  Laterality: Bilateral;  . Bowel resection  07/04/2012    Procedure: SMALL BOWEL RESECTION;  Surgeon: Jeannette Corpus, MD;  Location: [facility];  Service: Gynecology;;   History  Substance Use Topics  . Smoking status: Never Smoker   . Smokeless tobacco: Never Used  . Alcohol Use: No   Family History  Problem Relation Age of Onset  . Heart disease Sister     CABG  . Stroke Sister   . Heart disease Brother     CABG  . Cancer Brother     LEUKEMIA  . Cancer Brother     LUNG  . Cancer Brother     STOMACH  Allergies  Allergen Reactions  . Amoxicillin-Pot Clavulanate     REACTION: stomach upset  . Lipitor [Atorvastatin Calcium] Other (See Comments)    Leg ache and ache all over  . Simvastatin     REACTION: muscle pain  . Tape Itching and Rash    "Only the paper tape"   Current Outpatient Prescriptions on File Prior to Visit  Medication Sig Dispense Refill  . amLODipine (NORVASC) 5 MG tablet Take 1 tablet (5 mg total) by mouth daily.  90 tablet  3  . COLCRYS 0.6 MG tablet TAKE 1 TABLET (0.6 MG TOTAL) BY MOUTH DAILY. AS NEEDED FOR GOUT PAIN.  20 tablet  1  . cyanocobalamin 500 MCG tablet Take by mouth as directed.       . diclofenac sodium (VOLTAREN) 1 % GEL Apply 2 g topically 4 (four) times daily.  100 g  1  . doxazosin (CARDURA) 8 MG tablet Take 1 tablet (8 mg total) by mouth at bedtime.  90 tablet  3  . lisinopril  (PRINIVIL,ZESTRIL) 5 MG tablet Take 1 tablet (5 mg total) by mouth at bedtime.  90 tablet  3  . meloxicam (MOBIC) 7.5 MG tablet Take 1 tablet (7.5 mg total) by mouth daily.  30 tablet  0  . metoprolol (LOPRESSOR) 100 MG tablet Take 1 tablet (100 mg total) by mouth 2 (two) times daily.  180 tablet  3   No current facility-administered medications on file prior to visit.    Review of Systems Review of Systems  Constitutional: Negative for fever, appetite change, fatigue and unexpected weight change.  Eyes: Negative for pain and visual disturbance.  Respiratory: Negative for cough and shortness of breath.   Cardiovascular: Negative for cp or palpitations    Gastrointestinal: Negative for nausea, diarrhea and constipation.  Genitourinary: Negative for urgency and frequency.  Skin: Negative for pallor or rash   MSK pos for joint pain and swelling -toe and elbow only Neurological: Negative for weakness, light-headedness, numbness and headaches.  Hematological: Negative for adenopathy. Does not bruise/bleed easily.  Psychiatric/Behavioral: Negative for dysphoric mood. The patient is not nervous/anxious.         Objective:   Physical Exam  Constitutional: She appears well-developed and well-nourished. No distress.  obese and well appearing   HENT:  Head: Normocephalic and atraumatic.  Eyes: Conjunctivae and EOM are normal. Pupils are equal, round, and reactive to light.  Neck: Normal range of motion. Neck supple.  Cardiovascular: Normal rate and regular rhythm.   Pulmonary/Chest: Effort normal and breath sounds normal.  Musculoskeletal: She exhibits edema and tenderness.  L 3rd toe is tender/ erythematous and slt swollen   R elbow is swollen and erythematous and warm - nl rom but hurts to flex or extend fully     Lymphadenopathy:    She has no cervical adenopathy.  Neurological: She is alert. She has normal reflexes. She exhibits normal muscle tone. Coordination normal.  Skin: Skin  is warm and dry. No rash noted. There is erythema. No pallor.  Psychiatric: She has a normal mood and affect.          Assessment & Plan:

## 2013-04-10 NOTE — Patient Instructions (Addendum)
I think you have gout in elbow and foot Elbow xray now- we will call you with a result  Also drawing some blood Take your gout medicine (colchicine)- twice daily with food for 4-5 days for symptoms  Update if not starting to improve in a week or if worsening  Gout Gout is an inflammatory arthritis caused by a buildup of uric acid crystals in the joints. Uric acid is a chemical that is normally present in the blood. When the level of uric acid in the blood is too high it can form crystals that deposit in your joints and tissues. This causes joint redness, soreness, and swelling (inflammation). Repeat attacks are common. Over time, uric acid crystals can form into masses (tophi) near a joint, destroying bone and causing disfigurement. Gout is treatable and often preventable. CAUSES  The disease begins with elevated levels of uric acid in the blood. Uric acid is produced by your body when it breaks down a naturally found substance called purines. Certain foods you eat, such as meats and fish, contain high amounts of purines. Causes of an elevated uric acid level include:  Being passed down from parent to child (heredity).  Diseases that cause increased uric acid production (such as obesity, psoriasis, and certain cancers).  Excessive alcohol use.  Diet, especially diets rich in meat and seafood.  Medicines, including certain cancer-fighting medicines (chemotherapy), water pills (diuretics), and aspirin.  Chronic kidney disease. The kidneys are no longer able to remove uric acid well.  Problems with metabolism. Conditions strongly associated with gout include:  Obesity.  High blood pressure.  High cholesterol.  Diabetes. Not everyone with elevated uric acid levels gets gout. It is not understood why some people get gout and others do not. Surgery, joint injury, and eating too much of certain foods are some of the factors that can lead to gout attacks. SYMPTOMS   An attack of gout comes  on quickly. It causes intense pain with redness, swelling, and warmth in a joint.  Fever can occur.  Often, only one joint is involved. Certain joints are more commonly involved:  Base of the big toe.  Knee.  Ankle.  Wrist.  Finger. Without treatment, an attack usually goes away in a few days to weeks. Between attacks, you usually will not have symptoms, which is different from many other forms of arthritis. DIAGNOSIS  Your caregiver will suspect gout based on your symptoms and exam. In some cases, tests may be recommended. The tests may include:  Blood tests.  Urine tests.  X-rays.  Joint fluid exam. This exam requires a needle to remove fluid from the joint (arthrocentesis). Using a microscope, gout is confirmed when uric acid crystals are seen in the joint fluid. TREATMENT  There are two phases to gout treatment: treating the sudden onset (acute) attack and preventing attacks (prophylaxis).  Treatment of an Acute Attack.  Medicines are used. These include anti-inflammatory medicines or steroid medicines.  An injection of steroid medicine into the affected joint is sometimes necessary.  The painful joint is rested. Movement can worsen the arthritis.  You may use warm or cold treatments on painful joints, depending which works best for you.  Treatment to Prevent Attacks.  If you suffer from frequent gout attacks, your caregiver may advise preventive medicine. These medicines are started after the acute attack subsides. These medicines either help your kidneys eliminate uric acid from your body or decrease your uric acid production. You may need to stay on these medicines  for a very long time.  The early phase of treatment with preventive medicine can be associated with an increase in acute gout attacks. For this reason, during the first few months of treatment, your caregiver may also advise you to take medicines usually used for acute gout treatment. Be sure you  understand your caregiver's directions. Your caregiver may make several adjustments to your medicine dose before these medicines are effective.  Discuss dietary treatment with your caregiver or dietitian. Alcohol and drinks high in sugar and fructose and foods such as meat, poultry, and seafood can increase uric acid levels. Your caregiver or dietician can advise you on drinks and foods that should be limited. HOME CARE INSTRUCTIONS   Do not take aspirin to relieve pain. This raises uric acid levels.  Only take over-the-counter or prescription medicines for pain, discomfort, or fever as directed by your caregiver.  Rest the joint as much as possible. When in bed, keep sheets and blankets off painful areas.  Keep the affected joint raised (elevated).  Apply warm or cold treatments to painful joints. Use of warm or cold treatments depends on which works best for you.  Use crutches if the painful joint is in your leg.  Drink enough fluids to keep your urine clear or pale yellow. This helps your body get rid of uric acid. Limit alcohol, sugary drinks, and fructose drinks.  Follow your dietary instructions. Pay careful attention to the amount of protein you eat. Your daily diet should emphasize fruits, vegetables, whole grains, and fat-free or low-fat milk products. Discuss the use of coffee, vitamin C, and cherries with your caregiver or dietician. These may be helpful in lowering uric acid levels.  Maintain a healthy body weight. SEEK MEDICAL CARE IF:   You develop diarrhea, vomiting, or any side effects from medicines.  You do not feel better in 24 hours, or you are getting worse. SEEK IMMEDIATE MEDICAL CARE IF:   Your joint becomes suddenly more tender, and you have chills or a fever. MAKE SURE YOU:   Understand these instructions.  Will watch your condition.  Will get help right away if you are not doing well or get worse. Document Released: 04/02/2000 Document Revised:  07/31/2012 Document Reviewed: 11/17/2011 [facility] Patient Information 2014 [place], [place].

## 2013-04-10 NOTE — Assessment & Plan Note (Signed)
Few injuries that are improved Long disc about balance and fall risk  Pt declines use of walker or cane -saying she just needs to be more careful

## 2013-04-10 NOTE — Assessment & Plan Note (Addendum)
Looks to be active in R elbow and L 3rd toe  Adv to take colcrys bid with food as tol for 3-5 d  Cool compresses as needed  Xray elbow to look for any signs of joint destruction  Uric acid level today Disc low purine diet

## 2013-05-01 ENCOUNTER — Ambulatory Visit (INDEPENDENT_AMBULATORY_CARE_PROVIDER_SITE_OTHER): Payer: Medicare Other | Admitting: Family Medicine

## 2013-05-01 ENCOUNTER — Encounter: Payer: Self-pay | Admitting: Family Medicine

## 2013-05-01 VITALS — BP 128/82 | HR 69 | Temp 98.1°F | Ht 62.75 in | Wt 241.5 lb

## 2013-05-01 DIAGNOSIS — M109 Gout, unspecified: Secondary | ICD-10-CM

## 2013-05-01 MED ORDER — ALLOPURINOL 100 MG PO TABS: 100.0000 mg | ORAL_TABLET | Freq: Every day | ORAL | Status: DC

## 2013-05-01 NOTE — Progress Notes (Signed)
Subjective:    Patient ID: Valerie Ramirez, female    DOB: 08/13/1932, 81 y.o.   MRN: [id number]  HPI Here for f/u of gout   Last visit pain in R elbow and L 3rd toe (elbow resolved / toe is mostly better) Now has pain in L ankle with swelling (worse last week-now improved) tx with colcrys  Uric acid level is 9.7    She took colcrys for 5 days - and that improved her symptoms  Now she takes it as needed - after about 2 days   She has not avoided any particular foods    Wt is up 5 lb - holidays   She is drinking lot of water - lots - about 16 servings per day   Patient Active Problem List   Diagnosis Date Noted  . History of fall 04/10/2013  . Foot pain 10/03/2012  . Encounter for Medicare annual wellness exam 08/08/2012  . Gout 07/14/2012  . Endometrial cancer 05/25/2012  . Degenerative joint disease of cervical spine 05/03/2011  . Other screening mammogram 03/31/2011  . Neck pain 03/31/2011  . Thoracic back pain 03/31/2011  . NECK PAIN 02/11/2010  . HEADACHE 02/11/2010  . Hyperglycemia 09/26/2007  . OBESITY 07/12/2007  . GOITER, MULTINODULAR 07/11/2007  . HYPERTENSION 07/11/2007  . CARDIOMYOPATHY 07/11/2007  . OSTEOARTHRITIS 07/11/2007  . LEG EDEMA 07/11/2007  . 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 LIVER FUNCTION TESTS, ABNORMAL 07/11/2007  . HYPOTHYROIDISM NOS 10/05/2006  . HYPERCHOLESTEROLEMIA, PURE 10/05/2006   Past Medical History  Diagnosis Date  . Hypertension   . Hypothyroid   . Arthritis     OA  . Hyperlipidemia   . GERD (gastroesophageal reflux disease)   . Cancer     endometrial   Past Surgical History  Procedure Laterality Date  . Cholecystectomy    . Tubal ligation    . Knee arthroplasty  05/23/1998    total right  . Polyp removal  1999  . Hand surgery  12/2008    after hand fx/due to fall  . Joint replacement Bilateral 07/1998    knees  . Bone spur removed Right     shoulder  . Dilation and curettage of uterus  1999    . Eye surgery Bilateral     cataracts  . Laparotomy Bilateral 07/04/2012    Procedure: EXPLORATORY LAPAROTOMY TOTAL ABDOMINAL HYSTERECTOMY BILATERAL SALPINGO-OOPHORECTOMY with small bowel resection ;  Surgeon: Alvino Chapel, MD;  Location: [facility];  Service: Gynecology;  Laterality: Bilateral;  . Bowel resection  07/04/2012    Procedure: SMALL BOWEL RESECTION;  Surgeon: Alvino Chapel, MD;  Location: [facility];  Service: Gynecology;;   History  Substance Use Topics  . Smoking status: Never Smoker   . Smokeless tobacco: Never Used  . Alcohol Use: No   Family History  Problem Relation Age of Onset  . Heart disease Sister     CABG  . Stroke Sister   . Heart disease Brother     CABG  . Cancer Brother     LEUKEMIA  . Cancer Brother     LUNG  . Cancer Brother     STOMACH   Allergies  Allergen Reactions  . Amoxicillin-Pot Clavulanate     REACTION: stomach upset  . Lipitor [Atorvastatin Calcium] Other (See Comments)    Leg ache and ache all over  . Simvastatin     REACTION: muscle pain  . Tape Itching and Rash    "  Only the paper tape"   Current Outpatient Prescriptions on File Prior to Visit  Medication Sig Dispense Refill  . amLODipine (NORVASC) 5 MG tablet Take 1 tablet (5 mg total) by mouth daily.  90 tablet  3  . colchicine (COLCRYS) 0.6 MG tablet Take 1 tablet (0.6 mg total) by mouth 2 (two) times daily. With food as needed for gout  30 tablet  1  . cyanocobalamin 500 MCG tablet Take by mouth as directed.       . diclofenac sodium (VOLTAREN) 1 % GEL Apply 2 g topically 4 (four) times daily.  100 g  1  . doxazosin (CARDURA) 8 MG tablet Take 1 tablet (8 mg total) by mouth at bedtime.  90 tablet  3  . DULoxetine (CYMBALTA) 20 MG capsule Take 1 capsule by mouth daily.      [name] lisinopril (PRINIVIL,ZESTRIL) 5 MG tablet Take 1 tablet (5 mg total) by mouth at bedtime.  90 tablet  3  . meloxicam (MOBIC) 7.5 MG tablet Take 1 tablet (7.5 mg total) by mouth daily.  30  tablet  0  . metoprolol (LOPRESSOR) 100 MG tablet Take 1 tablet (100 mg total) by mouth 2 (two) times daily.  180 tablet  3   No current facility-administered medications on file prior to visit.    Review of Systems Review of Systems  Constitutional: Negative for fever, appetite change, fatigue and unexpected weight change.  Eyes: Negative for pain and visual disturbance.  Respiratory: Negative for cough and shortness of breath.   Cardiovascular: Negative for cp or palpitations    Gastrointestinal: Negative for nausea, diarrhea and constipation.  Genitourinary: Negative for urgency and frequency.  Skin: Negative for pallor or rash   MSK pos for joint pain and swelling  Neurological: Negative for weakness, light-headedness, numbness and headaches.  Hematological: Negative for adenopathy. Does not bruise/bleed easily.  Psychiatric/Behavioral: Negative for dysphoric mood. The patient is not nervous/anxious.         Objective:   Physical Exam  Constitutional: She appears well-developed and well-nourished. No distress.  obese and well appearing   HENT:  Head: Normocephalic and atraumatic.  Eyes: Conjunctivae and EOM are normal. Pupils are equal, round, and reactive to light.  Neck: Normal range of motion. Neck supple.  Cardiovascular: Normal rate and regular rhythm.   Pulmonary/Chest: Effort normal and breath sounds normal.  Musculoskeletal: She exhibits edema and tenderness.  r elbow nl   L 3rd toe- appears back to nl with good rom  L ankle -mild edema medially with tenderness Full rom No redness or heat   Lymphadenopathy:    She has no cervical adenopathy.  Neurological: She is alert. She has normal reflexes.  Skin: Skin is warm and dry. No rash noted.  No tophi noted   Psychiatric: She has a normal mood and affect.          Assessment & Plan:

## 2013-05-01 NOTE — Patient Instructions (Addendum)
Start the allopurinol 100 mg daily- take this every day to prevent gout by decreasing uric acid levels  Take the colcrys only when you need it for gout  Keep drinking lots of water  Update me if worse or any problems  Work on weight loss- this will help Schedule non fasting in 2 weeks- will adjust the allopurinol as needed     Purine Restricted Diet A low-purine diet consists of foods that reduce uric acid made in your body. INDICATIONS FOR USE  Your caregiver may ask you to follow a low-purine diet to reduce gout flairs.  GUIDELINES  Avoid high-purine foods, including all alcohol, yeast extracts taken as supplements, and sauces made from meats (like gravy). Do not eat high-purine meats, including anchovies, sardines, herring, mussels, tuna, codfish, scallops, trout, haddock, bacon, organ meats, tripe, goose, wild game, and sweetbreads.  Grains  Allowed/Recommended: All, except those listed to consume in moderation.  Consume in Moderation: Oatmeal ( cup uncooked daily), wheat bran or germ ( cup daily), and whole grains. Vegetables  Allowed/Recommended: All, except those listed to consume in moderation.  Consume in Moderation: Asparagus, cauliflower, spinach, mushrooms, and green peas ( cup daily). Fruit  Allowed/Recommended: All.  Consume in Moderation: None. Meat and Meat Substitutes  Allowed/Recommended: Eggs, nuts, and peanut butter.  Consume in Moderation: Limit to 4 to 6 oz daily. Avoid high-purine meats. Lentils, peas, and dried beans (1 cup daily). Milk  Allowed/Recommended: All. Choose low-fat or skim when possible.  Consume in Moderation: None. Fats and Oils  Allowed/Recommended: All.  Consume in Moderation: None. Beverages  Allowed/Recommended: All, except those listed to avoid.  Avoid: All alcohol. Condiments/Miscellaneous  Allowed/Recommended: All, except those listed to consume in moderation.  Consume in Moderation: Bouillon and meat-based broths  and soups. Document Released: 07/31/2010 Document Revised: 06/28/2011 Document Reviewed: 07/31/2010 [facility] Patient Information 2014 [place].

## 2013-05-01 NOTE — Progress Notes (Signed)
Pre-visit discussion using our clinic review tool. No additional management support is needed unless otherwise documented below in the visit note.  

## 2013-05-02 NOTE — Assessment & Plan Note (Signed)
Will start allopurinol 100 mg daily to get uric acid down Also handout on low purine diet Disc imp of water intake also  Will use colcrys prn  Update if no imp  Re check lab cmet/ uric acid planned

## 2013-05-15 ENCOUNTER — Other Ambulatory Visit (INDEPENDENT_AMBULATORY_CARE_PROVIDER_SITE_OTHER): Payer: Medicare Other

## 2013-05-15 DIAGNOSIS — M109 Gout, unspecified: Secondary | ICD-10-CM | POA: Diagnosis not present

## 2013-05-15 DIAGNOSIS — I10 Essential (primary) hypertension: Secondary | ICD-10-CM | POA: Diagnosis not present

## 2013-05-15 DIAGNOSIS — R7309 Other abnormal glucose: Secondary | ICD-10-CM | POA: Diagnosis not present

## 2013-05-15 DIAGNOSIS — R739 Hyperglycemia, unspecified: Secondary | ICD-10-CM

## 2013-05-15 LAB — COMPREHENSIVE METABOLIC PANEL
ALT: 10 U/L (ref 0–35)
AST: 14 U/L (ref 0–37)
Albumin: 3.3 g/dL — ABNORMAL LOW (ref 3.5–5.2)
Alkaline Phosphatase: 90 U/L (ref 39–117)
BUN: 19 mg/dL (ref 6–23)
CO2: 27 mEq/L (ref 19–32)
Calcium: 9 mg/dL (ref 8.4–10.5)
Chloride: 106 mEq/L (ref 96–112)
Creatinine, Ser: 1.2 mg/dL (ref 0.4–1.2)
GFR: 46.53 mL/min — ABNORMAL LOW (ref >60.00)
Glucose, Bld: 108 mg/dL — ABNORMAL HIGH (ref 70–99)
Potassium: 4 mEq/L (ref 3.5–5.1)
Sodium: 140 mEq/L (ref 135–145)
Total Bilirubin: 0.7 mg/dL (ref 0.3–1.2)
Total Protein: 7.1 g/dL (ref 6.0–8.3)

## 2013-05-15 LAB — URIC ACID: Uric Acid, Serum: 8.8 mg/dL — ABNORMAL HIGH (ref 2.4–7.0)

## 2013-05-17 ENCOUNTER — Other Ambulatory Visit: Payer: Self-pay | Admitting: *Deleted

## 2013-05-17 MED ORDER — ALLOPURINOL 100 MG PO TABS: 100.0000 mg | ORAL_TABLET | Freq: Two times a day (BID) | ORAL | Status: DC

## 2013-05-17 NOTE — Telephone Encounter (Signed)
Due to labs Dr. Glori Bickers advise me to send in new dose of allopurinol

## 2013-05-31 ENCOUNTER — Other Ambulatory Visit: Payer: Self-pay | Admitting: Family Medicine

## 2013-06-14 ENCOUNTER — Other Ambulatory Visit: Payer: Self-pay | Admitting: Family Medicine

## 2013-06-15 ENCOUNTER — Other Ambulatory Visit: Payer: Self-pay | Admitting: Family Medicine

## 2013-06-15 DIAGNOSIS — M109 Gout, unspecified: Secondary | ICD-10-CM

## 2013-06-19 ENCOUNTER — Other Ambulatory Visit (INDEPENDENT_AMBULATORY_CARE_PROVIDER_SITE_OTHER): Payer: Medicare Other

## 2013-06-19 DIAGNOSIS — R7309 Other abnormal glucose: Secondary | ICD-10-CM | POA: Diagnosis not present

## 2013-06-19 DIAGNOSIS — I10 Essential (primary) hypertension: Secondary | ICD-10-CM | POA: Diagnosis not present

## 2013-06-19 DIAGNOSIS — R739 Hyperglycemia, unspecified: Secondary | ICD-10-CM

## 2013-06-19 DIAGNOSIS — M109 Gout, unspecified: Secondary | ICD-10-CM

## 2013-06-19 LAB — COMPREHENSIVE METABOLIC PANEL
ALT: 11 U/L (ref 0–35)
AST: 15 U/L (ref 0–37)
Albumin: 3.5 g/dL (ref 3.5–5.2)
Alkaline Phosphatase: 97 U/L (ref 39–117)
BUN: 16 mg/dL (ref 6–23)
CO2: 28 mEq/L (ref 19–32)
Calcium: 9.1 mg/dL (ref 8.4–10.5)
Chloride: 102 mEq/L (ref 96–112)
Creatinine, Ser: 1.3 mg/dL — ABNORMAL HIGH (ref 0.4–1.2)
GFR: 42.01 mL/min — ABNORMAL LOW (ref >60.00)
Glucose, Bld: 101 mg/dL — ABNORMAL HIGH (ref 70–99)
Potassium: 4.7 mEq/L (ref 3.5–5.1)
Sodium: 137 mEq/L (ref 135–145)
Total Bilirubin: 0.2 mg/dL — ABNORMAL LOW (ref 0.3–1.2)
Total Protein: 7.2 g/dL (ref 6.0–8.3)

## 2013-06-19 LAB — URIC ACID: Uric Acid, Serum: 10.9 mg/dL — ABNORMAL HIGH (ref 2.4–7.0)

## 2013-08-02 ENCOUNTER — Telehealth: Payer: Self-pay | Admitting: Family Medicine

## 2013-08-02 DIAGNOSIS — E039 Hypothyroidism, unspecified: Secondary | ICD-10-CM

## 2013-08-02 DIAGNOSIS — I10 Essential (primary) hypertension: Secondary | ICD-10-CM

## 2013-08-02 DIAGNOSIS — E7800 Pure hypercholesterolemia, unspecified: Secondary | ICD-10-CM

## 2013-08-02 DIAGNOSIS — R739 Hyperglycemia, unspecified: Secondary | ICD-10-CM

## 2013-08-02 NOTE — Telephone Encounter (Signed)
Message copied by Abner Greenspan on Thu Aug 02, 2013  8:58 PM ------      [name]      Created: Thu Jul 19, 2013 12:48 PM      Regarding: Lab orders for Friday,4.17.15       Patient is scheduled for CPX labs, please order future labs, Thanks , Valerie Ramirez       ------

## 2013-08-03 ENCOUNTER — Other Ambulatory Visit (INDEPENDENT_AMBULATORY_CARE_PROVIDER_SITE_OTHER): Payer: Medicare Other

## 2013-08-03 ENCOUNTER — Telehealth: Payer: Self-pay | Admitting: Family Medicine

## 2013-08-03 DIAGNOSIS — E7800 Pure hypercholesterolemia, unspecified: Secondary | ICD-10-CM

## 2013-08-03 DIAGNOSIS — R7309 Other abnormal glucose: Secondary | ICD-10-CM

## 2013-08-03 DIAGNOSIS — E039 Hypothyroidism, unspecified: Secondary | ICD-10-CM

## 2013-08-03 DIAGNOSIS — I10 Essential (primary) hypertension: Secondary | ICD-10-CM | POA: Diagnosis not present

## 2013-08-03 DIAGNOSIS — R739 Hyperglycemia, unspecified: Secondary | ICD-10-CM

## 2013-08-03 LAB — COMPREHENSIVE METABOLIC PANEL
ALT: 10 U/L (ref 0–35)
AST: 15 U/L (ref 0–37)
Albumin: 3.3 g/dL — ABNORMAL LOW (ref 3.5–5.2)
Alkaline Phosphatase: 95 U/L (ref 39–117)
BUN: 26 mg/dL — ABNORMAL HIGH (ref 6–23)
CO2: 27 mEq/L (ref 19–32)
Calcium: 8.9 mg/dL (ref 8.4–10.5)
Chloride: 106 mEq/L (ref 96–112)
Creatinine, Ser: 1.2 mg/dL (ref 0.4–1.2)
GFR: 45.62 mL/min — ABNORMAL LOW (ref >60.00)
Glucose, Bld: 100 mg/dL — ABNORMAL HIGH (ref 70–99)
Potassium: 3.9 mEq/L (ref 3.5–5.1)
Sodium: 142 mEq/L (ref 135–145)
Total Bilirubin: 0.5 mg/dL (ref 0.3–1.2)
Total Protein: 7 g/dL (ref 6.0–8.3)

## 2013-08-03 LAB — CBC WITH DIFFERENTIAL/PLATELET
Basophils Absolute: 0 10*3/uL (ref 0.0–0.1)
Basophils Relative: 0.4 % (ref 0.0–3.0)
Eosinophils Absolute: 0.4 10*3/uL (ref 0.0–0.7)
Eosinophils Relative: 3.6 % (ref 0.0–5.0)
HCT: 35.8 % — ABNORMAL LOW (ref 36.0–46.0)
Hemoglobin: 11.6 g/dL — ABNORMAL LOW (ref 12.0–15.0)
Lymphocytes Relative: 32.1 % (ref 12.0–46.0)
Lymphs Abs: 3.2 10*3/uL (ref 0.7–4.0)
MCHC: 32.5 g/dL (ref 30.0–36.0)
MCV: 81.9 fl (ref 78.0–100.0)
Monocytes Absolute: 0.9 10*3/uL (ref 0.1–1.0)
Monocytes Relative: 9.5 % (ref 3.0–12.0)
Neutro Abs: 5.4 10*3/uL (ref 1.4–7.7)
Neutrophils Relative %: 54.4 % (ref 43.0–77.0)
Platelets: 210 10*3/uL (ref 150.0–400.0)
RBC: 4.37 Mil/uL (ref 3.87–5.11)
RDW: 19 % — ABNORMAL HIGH (ref 11.5–14.6)
WBC: 9.9 10*3/uL (ref 4.5–10.5)

## 2013-08-03 LAB — LIPID PANEL
Cholesterol: 189 mg/dL (ref 0–200)
HDL: 47.9 mg/dL (ref >39.00)
LDL Cholesterol: 111 mg/dL — ABNORMAL HIGH (ref 0–99)
Total CHOL/HDL Ratio: 4
Triglycerides: 149 mg/dL (ref 0.0–149.0)
VLDL: 29.8 mg/dL (ref 0.0–40.0)

## 2013-08-03 LAB — HEMOGLOBIN A1C: Hgb A1c MFr Bld: 6 % (ref 4.6–6.5)

## 2013-08-03 LAB — TSH: TSH: 2.7 u[IU]/mL (ref 0.35–5.50)

## 2013-08-03 NOTE — Telephone Encounter (Signed)
That is fine -whatever works

## 2013-08-03 NOTE — Telephone Encounter (Signed)
Pt came in for her lab work today for her CPE which is scheduled for 10:30 next Friday, 08/10/2013.  She wants to move her apptmt to around 12:15 on the same day if possible b/c she has to take her husband to the doctor in [place] earlier that morning and won't be able to make it back here by 10:30.  Is this ok w/you? If we r/s her it will be as far out as September.  Please advise.  We can call pt to let her know [phone]. Thank you.

## 2013-08-03 NOTE — Telephone Encounter (Signed)
R/s to 08/10/2013 @ 12:15 p.m.

## 2013-08-06 ENCOUNTER — Telehealth: Payer: Self-pay | Admitting: Family Medicine

## 2013-08-06 NOTE — Telephone Encounter (Signed)
emmi mailed to patient °

## 2013-08-10 ENCOUNTER — Encounter: Payer: Self-pay | Admitting: Family Medicine

## 2013-08-10 ENCOUNTER — Ambulatory Visit (INDEPENDENT_AMBULATORY_CARE_PROVIDER_SITE_OTHER): Payer: Medicare Other | Admitting: Family Medicine

## 2013-08-10 ENCOUNTER — Encounter: Payer: Medicare Other | Admitting: Family Medicine

## 2013-08-10 VITALS — BP 110/68 | HR 67 | Temp 97.4°F | Ht 62.5 in | Wt 243.2 lb

## 2013-08-10 DIAGNOSIS — E669 Obesity, unspecified: Secondary | ICD-10-CM | POA: Diagnosis not present

## 2013-08-10 DIAGNOSIS — Z1211 Encounter for screening for malignant neoplasm of colon: Secondary | ICD-10-CM | POA: Insufficient documentation

## 2013-08-10 DIAGNOSIS — E7800 Pure hypercholesterolemia, unspecified: Secondary | ICD-10-CM

## 2013-08-10 DIAGNOSIS — I10 Essential (primary) hypertension: Secondary | ICD-10-CM

## 2013-08-10 DIAGNOSIS — Z0000 Encounter for general adult medical examination without abnormal findings: Secondary | ICD-10-CM

## 2013-08-10 DIAGNOSIS — R7309 Other abnormal glucose: Secondary | ICD-10-CM

## 2013-08-10 DIAGNOSIS — R739 Hyperglycemia, unspecified: Secondary | ICD-10-CM

## 2013-08-10 DIAGNOSIS — H6120 Impacted cerumen, unspecified ear: Secondary | ICD-10-CM

## 2013-08-10 DIAGNOSIS — L820 Inflamed seborrheic keratosis: Secondary | ICD-10-CM | POA: Insufficient documentation

## 2013-08-10 DIAGNOSIS — H6122 Impacted cerumen, left ear: Secondary | ICD-10-CM | POA: Insufficient documentation

## 2013-08-10 MED ORDER — DULOXETINE HCL 20 MG PO CPEP: 20.0000 mg | ORAL_CAPSULE | Freq: Every day | ORAL | Status: DC

## 2013-08-10 NOTE — Progress Notes (Signed)
Pre visit review using our clinic review tool, if applicable. No additional management support is needed unless otherwise documented below in the visit note. 

## 2013-08-10 NOTE — Progress Notes (Signed)
Subjective:    Patient ID: Valerie Ramirez, female    DOB: [birth year], 80 y.o.   MRN: [id number]  HPI I have personally reviewed the Medicare Annual Wellness questionnaire and have noted 1. The patient's medical and social history 2. Their use of alcohol, tobacco or illicit drugs 3. Their current medications and supplements 4. The patient's functional ability including ADL's, fall risks, home safety risks and hearing or visual             impairment. 5. Diet and physical activities 6. Evidence for depression or mood disorders  The patients weight, height, BMI have been recorded in the chart and visual acuity is per eye clinic.  I have made referrals, counseling and provided education to the patient based review of the above and I have provided the pt with a written personalized care plan for preventive services.  Foot hurts today- gout  An aspirin really helps  Chronic foot pain and also gout  L foot  occ swells-not as bad as it was - her ankle and toe stay red -has been bad for 3 days   occ she can hear out of L ear better than other days   Wants to looks at a mole on her back that itches-she scratches it and it bleeds   See scanned forms.  Routine anticipatory guidance given to patient.  See health maintenance. Colon cancer screening - never had one , and does not want one - will do IFOB  Breast cancer screening mammogram 5/14- she has the letter ,she will [facility] appt at [facility]  Self breast exam  Hx of endomet carcinoma - had pap smear in the fall and she has f/u May the 8th to Dr Toney Rakes  Flu vaccine 10/14 Tetanus vaccine Td 7/13  Pneumovax 3/14 - she has not had prevnar -wants to wait on that (declines it today) Zoster vaccine - she has not had shingles   Advance directive - has a living will set up  Cognitive function addressed- see scanned forms- and if abnormal then additional documentation follows. - memory is overall really good    PMH and SH reviewed  Meds,  vitals, and allergies reviewed.   ROS: See HPI.  Otherwise negative.    bp is stable today  No cp or palpitations or headaches or edema  No side effects to medicines  BP Readings from Last 3 Encounters:  08/10/13 110/68  05/01/13 128/82  04/10/13 134/84     Wt is up 2 lb with bmi 43 Obesity  Hyperglycemia stable Lab Results  Component Value Date   HGBA1C 6.0 08/03/2013    dexa 6/01 normal  Stable mild anemia  Lab Results  Component Value Date   WBC 9.9 08/03/2013   HGB 11.6* 08/03/2013   HCT 35.8* 08/03/2013   MCV 81.9 08/03/2013   PLT 210.0 08/03/2013    Cholesterol is fair Lab Results  Component Value Date   CHOL 189 08/03/2013   HDL 47.90 08/03/2013   LDLCALC 111* 08/03/2013   LDLDIRECT 115.6 07/28/2012   TRIG 149.0 08/03/2013   CHOLHDL 4 08/03/2013    Patient Active Problem List   Diagnosis Date Noted  . Colon cancer screening 08/10/2013  . Seborrheic keratoses, inflamed 08/10/2013  . Excessive cerumen in left ear canal 08/10/2013  . History of fall 04/10/2013  . Encounter for Medicare annual wellness exam 08/08/2012  . Gout 07/14/2012  . Endometrial cancer 05/25/2012  . Degenerative joint disease of cervical spine 05/03/2011  .  Other screening mammogram 03/31/2011  . Hyperglycemia 09/26/2007  . OBESITY 07/12/2007  . GOITER, MULTINODULAR 07/11/2007  . HYPERTENSION 07/11/2007  . CARDIOMYOPATHY 07/11/2007  . OSTEOARTHRITIS 07/11/2007  . 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 HYPOTHYROIDISM NOS 10/05/2006  . HYPERCHOLESTEROLEMIA, PURE 10/05/2006   Past Medical History  Diagnosis Date  . Hypertension   . Hypothyroid   . Arthritis     OA  . Hyperlipidemia   . GERD (gastroesophageal reflux disease)   . Cancer     endometrial   Past Surgical History  Procedure Laterality Date  . Cholecystectomy    . Tubal ligation    . Knee arthroplasty  05/23/1998    total right  . Polyp removal  1999  . Hand surgery  12/2008    after hand fx/due to fall    . Joint replacement Bilateral 07/1998    knees  . Bone spur removed Right     shoulder  . Dilation and curettage of uterus  1999  . Eye surgery Bilateral     cataracts  . Laparotomy Bilateral 07/04/2012    Procedure: EXPLORATORY LAPAROTOMY TOTAL ABDOMINAL HYSTERECTOMY BILATERAL SALPINGO-OOPHORECTOMY with small bowel resection ;  Surgeon: Alvino Chapel, MD;  Location: [facility];  Service: Gynecology;  Laterality: Bilateral;  . Bowel resection  07/04/2012    Procedure: SMALL BOWEL RESECTION;  Surgeon: Alvino Chapel, MD;  Location: [facility];  Service: Gynecology;;   History  Substance Use Topics  . Smoking status: Never Smoker   . Smokeless tobacco: Never Used  . Alcohol Use: No   Family History  Problem Relation Age of Onset  . Heart disease Sister     CABG  . Stroke Sister   . Heart disease Brother     CABG  . Cancer Brother     LEUKEMIA  . Cancer Brother     LUNG  . Cancer Brother     STOMACH   Allergies  Allergen Reactions  . Allopurinol Nausea Only  . Amoxicillin-Pot Clavulanate     REACTION: stomach upset  . Lipitor [Atorvastatin Calcium] Other (See Comments)    Leg ache and ache all over  . Simvastatin     REACTION: muscle pain  . Tape Itching and Rash    "Only the paper tape"   Current Outpatient Prescriptions on File Prior to Visit  Medication Sig Dispense Refill  . amLODipine (NORVASC) 5 MG tablet Take 1 tablet (5 mg total) by mouth daily.  90 tablet  3  . COLCRYS 0.6 MG tablet TAKE 1 TABLET (0.6 MG TOTAL) BY MOUTH 2 (TWO) TIMES DAILY. WITH FOOD AS NEEDED FOR GOUT  30 tablet  1  . cyanocobalamin 500 MCG tablet Take by mouth as directed.       . doxazosin (CARDURA) 8 MG tablet Take 1 tablet (8 mg total) by mouth at bedtime.  90 tablet  3  . lisinopril (PRINIVIL,ZESTRIL) 5 MG tablet TAKE 1 TABLET (5 MG TOTAL) BY MOUTH AT BEDTIME.  90 tablet  0  . metoprolol (LOPRESSOR) 100 MG tablet Take 1 tablet (100 mg total) by mouth 2 (two) times daily.  180  tablet  3  . diclofenac sodium (VOLTAREN) 1 % GEL Apply 2 g topically 4 (four) times daily.  100 g  1   No current facility-administered medications on file prior to visit.    Review of Systems Review of Systems  Constitutional: Negative for fever, appetite change, fatigue and unexpected weight change.  Eyes:  Negative for pain and visual disturbance.  Respiratory: Negative for cough and shortness of breath.   Cardiovascular: Negative for cp or palpitations    Gastrointestinal: Negative for nausea, diarrhea and constipation.  Genitourinary: Negative for urgency and frequency.  Skin: Negative for pallor or rash pos for irritated mole on back  MSK pos for gout pain in L foot that is improving    Neurological: Negative for weakness, light-headedness, numbness and headaches.  Hematological: Negative for adenopathy. Does not bruise/bleed easily.  Psychiatric/Behavioral: Negative for dysphoric mood. The patient is not nervous/anxious.         Objective:   Physical Exam  Constitutional: She appears well-developed and well-nourished. No distress.  obese and well appearing   HENT:  Head: Normocephalic and atraumatic.  Right Ear: External ear normal.  Nose: Nose normal.  Mouth/Throat: Oropharynx is clear and moist.  Some cerumen in L ear   Eyes: Conjunctivae and EOM are normal. Pupils are equal, round, and reactive to light. Right eye exhibits no discharge. Left eye exhibits no discharge. No scleral icterus.  Neck: Normal range of motion. Neck supple. No JVD present. No thyromegaly present.  Cardiovascular: Normal rate, regular rhythm, normal heart sounds and intact distal pulses.  Exam reveals no gallop.   Pulmonary/Chest: Effort normal and breath sounds normal. No respiratory distress. She has no wheezes. She has no rales.  Abdominal: Soft. Bowel sounds are normal. She exhibits no distension and no mass. There is no tenderness.  Musculoskeletal: She exhibits edema and tenderness.  L  foot-tenderness over medial ankle and medial bunion/ great toe without redness but some swelling   Lymphadenopathy:    She has no cervical adenopathy.  Neurological: She is alert. She has normal reflexes. No cranial nerve deficit. She exhibits normal muscle tone. Coordination normal.  Skin: Skin is warm and dry. No rash noted. No erythema. No pallor.  Irritated SK on back that has been scratched Not infected   Psychiatric: She has a normal mood and affect.          Assessment & Plan:

## 2013-08-10 NOTE — Patient Instructions (Signed)
Schedule a follow up appt so we can freeze the mole on your back  If your left foot still hurts at that time - then we will get an xray Please so IFOB stool card for colon cancer screening Don't forget to call and get your mammogram appt at [facility] For wax in your left year-get over the counter debrox ear solution and use it as directed  If you are interested in a shingles/zoster vaccine - call your insurance to check on coverage,( you should not get it within 1 month of other vaccines) , then call us for a prescription  for it to take to a pharmacy that gives the shot , or make a nurse visit to get it here depending on your coverage

## 2013-08-12 NOTE — Assessment & Plan Note (Signed)
Reviewed health habits including diet and exercise and skin cancer prevention Reviewed appropriate screening tests for age  Also reviewed health mt list, fam hx and immunization status , as well as social and family history    Labs rev  See HPI 

## 2013-08-12 NOTE — Assessment & Plan Note (Signed)
Discussed how this problem influences overall health and the risks it imposes  Reviewed plan for weight loss with lower calorie diet (via better food choices and also portion control or program like weight watchers) and exercise building up to or more than 30 minutes 5 days per week including some aerobic activity    

## 2013-08-12 NOTE — Assessment & Plan Note (Signed)
Lab Results  Component Value Date   HGBA1C 6.0 08/03/2013    This is stable Disc imp of low glycemic diet and wt loss

## 2013-08-12 NOTE — Assessment & Plan Note (Signed)
On back - irritated  Will f/u to tx with cryotherapy

## 2013-08-12 NOTE — Assessment & Plan Note (Signed)
Disc goals for lipids and reasons to control them Rev labs with pt Rev low sat fat diet in detail   

## 2013-08-12 NOTE — Assessment & Plan Note (Signed)
D/w patient re:options for colon cancer screening, including IFOB vs. colonoscopy.  Risks and benefits of both were discussed and patient voiced understanding.  Pt elects for:IFOB  

## 2013-08-12 NOTE — Assessment & Plan Note (Signed)
Pt will use debrox otc and f/u if needed for ear irrigation

## 2013-08-12 NOTE — Assessment & Plan Note (Signed)
bp in fair control at this time  BP Readings from Last 1 Encounters:  08/10/13 110/68   No changes needed Disc lifstyle change with low sodium diet and exercise   Lab reviewed

## 2013-08-14 ENCOUNTER — Ambulatory Visit (INDEPENDENT_AMBULATORY_CARE_PROVIDER_SITE_OTHER): Payer: Medicare Other | Admitting: Family Medicine

## 2013-08-14 ENCOUNTER — Encounter: Payer: Self-pay | Admitting: Family Medicine

## 2013-08-14 VITALS — BP 142/86 | HR 65 | Temp 97.6°F | Ht 62.5 in | Wt 244.5 lb

## 2013-08-14 DIAGNOSIS — L820 Inflamed seborrheic keratosis: Secondary | ICD-10-CM | POA: Diagnosis not present

## 2013-08-14 NOTE — Assessment & Plan Note (Signed)
Treated with cryotx today  Pt tolerated well  Disc aftercare  Update if any problems

## 2013-08-14 NOTE — Progress Notes (Signed)
Subjective:    Patient ID: Valerie Ramirez, female    DOB: 1932-11-25, 80 y.o.   MRN: [id number]  HPI Here to get a mole frozen off her back  Is very irritated and she has scratched it and it catches on clothing  Seen at her last visit   Patient Active Problem List   Diagnosis Date Noted  . Colon cancer screening 08/10/2013  . Seborrheic keratoses, inflamed 08/10/2013  . Excessive cerumen in left ear canal 08/10/2013  . History of fall 04/10/2013  . Encounter for Medicare annual wellness exam 08/08/2012  . Gout 07/14/2012  . Endometrial cancer 05/25/2012  . Degenerative joint disease of cervical spine 05/03/2011  . Other screening mammogram 03/31/2011  . Hyperglycemia 09/26/2007  . OBESITY 07/12/2007  . GOITER, MULTINODULAR 07/11/2007  . HYPERTENSION 07/11/2007  . CARDIOMYOPATHY 07/11/2007  . OSTEOARTHRITIS 07/11/2007  . 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 HYPOTHYROIDISM NOS 10/05/2006  . HYPERCHOLESTEROLEMIA, PURE 10/05/2006   Past Medical History  Diagnosis Date  . Hypertension   . Hypothyroid   . Arthritis     OA  . Hyperlipidemia   . GERD (gastroesophageal reflux disease)   . Cancer     endometrial   Past Surgical History  Procedure Laterality Date  . Cholecystectomy    . Tubal ligation    . Knee arthroplasty  05/23/1998    total right  . Polyp removal  1999  . Hand surgery  12/2008    after hand fx/due to fall  . Joint replacement Bilateral 07/1998    knees  . Bone spur removed Right     shoulder  . Dilation and curettage of uterus  1999  . Eye surgery Bilateral     cataracts  . Laparotomy Bilateral 07/04/2012    Procedure: EXPLORATORY LAPAROTOMY TOTAL ABDOMINAL HYSTERECTOMY BILATERAL SALPINGO-OOPHORECTOMY with small bowel resection ;  Surgeon: Alvino Chapel, MD;  Location: [facility];  Service: Gynecology;  Laterality: Bilateral;  . Bowel resection  07/04/2012    Procedure: SMALL BOWEL RESECTION;  Surgeon: Alvino Chapel,  MD;  Location: [facility];  Service: Gynecology;;   History  Substance Use Topics  . Smoking status: Never Smoker   . Smokeless tobacco: Never Used  . Alcohol Use: No   Family History  Problem Relation Age of Onset  . Heart disease Sister     CABG  . Stroke Sister   . Heart disease Brother     CABG  . Cancer Brother     LEUKEMIA  . Cancer Brother     LUNG  . Cancer Brother     STOMACH   Allergies  Allergen Reactions  . Allopurinol Nausea Only  . Amoxicillin-Pot Clavulanate     REACTION: stomach upset  . Lipitor [Atorvastatin Calcium] Other (See Comments)    Leg ache and ache all over  . Simvastatin     REACTION: muscle pain  . Tape Itching and Rash    "Only the paper tape"   Current Outpatient Prescriptions on File Prior to Visit  Medication Sig Dispense Refill  . amLODipine (NORVASC) 5 MG tablet Take 1 tablet (5 mg total) by mouth daily.  90 tablet  3  . COLCRYS 0.6 MG tablet TAKE 1 TABLET (0.6 MG TOTAL) BY MOUTH 2 (TWO) TIMES DAILY. WITH FOOD AS NEEDED FOR GOUT  30 tablet  1  . cyanocobalamin 500 MCG tablet Take by mouth as directed.       . diclofenac sodium (VOLTAREN)  1 % GEL Apply 2 g topically 4 (four) times daily.  100 g  1  . doxazosin (CARDURA) 8 MG tablet Take 1 tablet (8 mg total) by mouth at bedtime.  90 tablet  3  . DULoxetine (CYMBALTA) 20 MG capsule Take 1 capsule (20 mg total) by mouth daily.  90 capsule  3  . lisinopril (PRINIVIL,ZESTRIL) 5 MG tablet TAKE 1 TABLET (5 MG TOTAL) BY MOUTH AT BEDTIME.  90 tablet  0  . metoprolol (LOPRESSOR) 100 MG tablet Take 1 tablet (100 mg total) by mouth 2 (two) times daily.  180 tablet  3   No current facility-administered medications on file prior to visit.    Review of Systems Neg for rash or other skin change Neg for joint changes      Objective:   Physical Exam  Constitutional: She appears well-developed and well-nourished. No distress.  Neck: Normal range of motion. Neck supple.  Musculoskeletal: She exhibits  no edema.  Neurological: She is alert.  Skin: Skin is warm and dry.  4 by 4 mm irritated raised tan SK on right lower back This was tx with cryotx today-well tolerated   Several other SK on back as well as lentigos   Psychiatric: She has a normal mood and affect.          Assessment & Plan:

## 2013-08-14 NOTE — Progress Notes (Signed)
Pre visit review using our clinic review tool, if applicable. No additional management support is needed unless otherwise documented below in the visit note. 

## 2013-08-14 NOTE — Patient Instructions (Signed)
We froze an inflammed seborrheic keratosis  Keep it clean It should come off in the next week or two Let us know if any problems

## 2013-08-24 ENCOUNTER — Ambulatory Visit (INDEPENDENT_AMBULATORY_CARE_PROVIDER_SITE_OTHER): Payer: Medicare Other | Admitting: Gynecology

## 2013-08-24 ENCOUNTER — Other Ambulatory Visit (HOSPITAL_COMMUNITY)
Admission: RE | Admit: 2013-08-24 | Discharge: 2013-08-24 | Disposition: A | Discharge status: Home / Self Care | Payer: Medicare Other | Source: Ambulatory Visit | Attending: Gynecology | Admitting: Gynecology

## 2013-08-24 ENCOUNTER — Encounter: Payer: Self-pay | Admitting: Gynecology

## 2013-08-24 VITALS — BP 134/78 | Ht 62.5 in | Wt 243.0 lb

## 2013-08-24 DIAGNOSIS — N951 Menopausal and female climacteric states: Secondary | ICD-10-CM | POA: Diagnosis not present

## 2013-08-24 DIAGNOSIS — Z780 Asymptomatic menopausal state: Secondary | ICD-10-CM

## 2013-08-24 DIAGNOSIS — Z1272 Encounter for screening for malignant neoplasm of vagina: Secondary | ICD-10-CM | POA: Diagnosis not present

## 2013-08-24 DIAGNOSIS — Z1151 Encounter for screening for human papillomavirus (HPV): Secondary | ICD-10-CM | POA: Insufficient documentation

## 2013-08-24 DIAGNOSIS — Z8542 Personal history of malignant neoplasm of other parts of uterus: Secondary | ICD-10-CM

## 2013-08-24 DIAGNOSIS — N952 Postmenopausal atrophic vaginitis: Secondary | ICD-10-CM

## 2013-08-24 DIAGNOSIS — Z124 Encounter for screening for malignant neoplasm of cervix: Secondary | ICD-10-CM | POA: Insufficient documentation

## 2013-08-24 NOTE — Patient Instructions (Signed)
Colonoscopy A colonoscopy is an exam to look at the entire large intestine (colon). This exam can help find problems such as tumors, polyps, inflammation, and areas of bleeding. The exam takes about 1 hour.  LET [facility] CARE PROVIDER KNOW ABOUT:   Any allergies you have.  All medicines you are taking, including vitamins, herbs, eye drops, creams, and over-the-counter medicines.  Previous problems you or members of your family have had with the use of anesthetics.  Any blood disorders you have.  Previous surgeries you have had.  Medical conditions you have. RISKS AND COMPLICATIONS  Generally, this is a safe procedure. However, as with any procedure, complications can occur. Possible complications include:  Bleeding.  Tearing or rupture of the colon wall.  Reaction to medicines given during the exam.  Infection (rare). BEFORE THE PROCEDURE   Ask your health care provider about changing or stopping your regular medicines.  You may be prescribed an oral bowel prep. This involves drinking a large amount of medicated liquid, starting the day before your procedure. The liquid will cause you to have multiple loose stools until your stool is almost clear or light green. This cleans out your colon in preparation for the procedure.  Do not eat or drink anything else once you have started the bowel prep, unless your health care provider tells you it is safe to do so.  Arrange for someone to drive you home after the procedure. PROCEDURE   You will be given medicine to help you relax (sedative).  You will lie on your side with your knees bent.  A long, flexible tube with a light and camera on the end (colonoscope) will be inserted through the rectum and into the colon. The camera sends video back to a computer screen as it moves through the colon. The colonoscope also releases carbon dioxide gas to inflate the colon. This helps your health care provider see the area better.  During  the exam, your health care provider may take a small tissue sample (biopsy) to be examined under a microscope if any abnormalities are found.  The exam is finished when the entire colon has been viewed. AFTER THE PROCEDURE   Do not drive for 24 hours after the exam.  You may have a small amount of blood in your stool.  You may pass moderate amounts of gas and have mild abdominal cramping or bloating. This is caused by the gas used to inflate your colon during the exam.  Ask when your test results will be ready and how you will get your results. Make sure you get your test results. Document Released: 04/02/2000 Document Revised: 01/24/2013 Document Reviewed: 12/11/2012 [facility] Patient Information 2014 [place]. Shingles Vaccine What You Need to Know WHAT IS SHINGLES?  Shingles is a painful skin rash, often with blisters. It is also called Herpes Zoster or just Zoster.  A shingles rash usually appears on one side of the face or body and lasts from 2 to 4 weeks. Its main symptom is pain, which can be quite severe. Other symptoms of shingles can include fever, headache, chills, and upset stomach. Very rarely, a shingles infection can lead to pneumonia, hearing problems, blindness, brain inflammation (encephalitis), or death.  For about 1 person in 5, severe pain can continue even after the rash clears up. This is called post-herpetic neuralgia.  Shingles is caused by the Varicella Zoster virus. This is the same virus that causes chickenpox. Only someone who has had a case of  chickenpox or rarely, has gotten chickenpox vaccine, can get shingles. The virus stays in your body. It can reappear many years later to cause a case of shingles.  You cannot catch shingles from another person with shingles. However, a person who has never had chickenpox (or chickenpox vaccine) could get chickenpox from someone with shingles. This is not very common.  Shingles is far more common in people 33 and  older than in younger people. It is also more common in people whose immune systems are weakened because of a disease such as cancer or drugs such as steroids or chemotherapy.  At least 1 million people get shingles per year in the [place]. SHINGLES VACCINE  A vaccine for shingles was licensed in 5573. In clinical trials, the vaccine reduced the risk of shingles by 50%. It can also reduce the pain in people who still get shingles after being vaccinated.  A single dose of shingles vaccine is recommended for adults 53 years of age and older. SOME PEOPLE SHOULD NOT GET SHINGLES VACCINE OR SHOULD WAIT A person should not get shingles vaccine if he or she:  Has ever had a life-threatening allergic reaction to gelatin, the antibiotic neomycin, or any other component of shingles vaccine. Tell your caregiver if you have any severe allergies.  Has a weakened immune system because of current:  AIDS or another disease that affects the immune system.  Treatment with drugs that affect the immune system, such as prolonged use of high-dose steroids.  Cancer treatment, such as radiation or chemotherapy.  Cancer affecting the bone marrow or lymphatic system, such as leukemia or lymphoma.  Is pregnant, or might be pregnant. Women should not become pregnant until at least 4 weeks after getting shingles vaccine. Someone with a minor illness, such as a cold, may be vaccinated. Anyone with a moderate or severe acute illness should usually wait until he or she recovers before getting the vaccine. This includes anyone with a temperature of 101.3 F (38 C) or higher. WHAT ARE THE RISKS FROM SHINGLES VACCINE?  A vaccine, like any medicine, could possibly cause serious problems, such as severe allergic reactions. However, the risk of a vaccine causing serious harm, or death, is extremely small.  No serious problems have been identified with shingles vaccine. Mild Problems  Redness, soreness, swelling, or  itching at the site of the injection (about 1 person in 3).  Headache (about 1 person in 74). Like all vaccines, shingles vaccine is being closely monitored for unusual or severe problems. WHAT IF THERE IS A MODERATE OR SEVERE REACTION? What should I look for? Any unusual condition, such as a severe allergic reaction or a high fever. If a severe allergic reaction occurred, it would be within a few minutes to an hour after the shot. Signs of a serious allergic reaction can include difficulty breathing, weakness, hoarseness or wheezing, a fast heartbeat, hives, dizziness, paleness, or swelling of the throat. What should I do?  Call your caregiver, or get the person to a caregiver right away.  Tell the caregiver what happened, the date and time it happened, and when the vaccination was given.  Ask the caregiver to report the reaction by filing a Vaccine Adverse Event Reporting System (VAERS) form. Or, you can file this report through the VAERS web site at www.vaers.SamedayNews.es or by calling 864-106-3369. VAERS does not provide medical advice. HOW CAN I LEARN MORE?  Ask your caregiver. He or she can give you the vaccine package insert or  suggest other sources of information.  Contact the Centers for Disease Control and Prevention (CDC):  Call 971-215-6242 (1-800-CDC-INFO).  Visit the CDC website at http://hunter.com/ CDC Shingles Vaccine VIS (01/23/08) Document Released: 01/31/2006 Document Revised: 06/28/2011 Document Reviewed: 07/26/2012 [facility] Patient Information 2014 [place].

## 2013-08-27 ENCOUNTER — Other Ambulatory Visit (INDEPENDENT_AMBULATORY_CARE_PROVIDER_SITE_OTHER): Payer: Medicare Other

## 2013-08-27 DIAGNOSIS — Z1211 Encounter for screening for malignant neoplasm of colon: Secondary | ICD-10-CM | POA: Diagnosis not present

## 2013-08-27 LAB — FECAL OCCULT BLOOD, GUAIAC: Fecal Occult Blood: NEGATIVE

## 2013-08-27 LAB — FECAL OCCULT BLOOD, IMMUNOCHEMICAL: Fecal Occult Bld: NEGATIVE

## 2013-08-27 NOTE — Progress Notes (Signed)
Valerie Ramirez January 27, 1933 [id number]   History:    80 y.o.  for GYN exam. In 2014 patient had been seen in the office as a result of postmenopausal bleeding had an endometrial biopsy which demonstrated endometrial cancer and she was referred to the GYN oncologist Dr. Threasa Heads along 07/04/2012 performed a total abdominal hysterectomy with bilateral salpingo-oophorectomy and small bowel resection with re\re anastomoses. Her final pathology report demonstrated stage IA grade 1 endometrial carcinoma. The small bowel resection according to his notes had been performed as the result of severe adhesions. She had uncomplicated postoperative course. No further treatment was recommended. He last saw patient 02/09/2013 and he recommended to see her yearly as well as me.   Patient's primary physician is taking care of her multinodular goiter as well as hypercholesterolemia, hypertension and type 2 diabetes. Patient also has history of cardiomyopathy.  Patient due for mammogram this year and her last bone density study was in 2001. She had a normal Pap smear 2014.    Past medical history,surgical history, family history and social history were all reviewed and documented in the EPIC chart.  Gynecologic History No LMP recorded. Patient is postmenopausal. Contraception: status post hysterectomy Last Pap: 2014. Results were: normal Last mammogram: 2014. Results were: normal  Obstetric History OB History  Gravida Para Term Preterm AB SAB TAB Ectopic Multiple Living  5 2   3 3    2     # Outcome Date GA Lbr Len/2nd Weight Sex Delivery Anes PTL Lv  5 SAB           4 SAB           3 SAB           2 PAR           1 PAR                ROS: A ROS was performed and pertinent positives and negatives are included in the history.  GENERAL: No fevers or chills. HEENT: No change in vision, no earache, sore throat or sinus congestion. NECK: No pain or stiffness. CARDIOVASCULAR: No chest pain or  pressure. No palpitations. PULMONARY: No shortness of breath, cough or wheeze. GASTROINTESTINAL: No abdominal pain, nausea, vomiting or diarrhea, melena or bright red blood per rectum. GENITOURINARY: No urinary frequency, urgency, hesitancy or dysuria. MUSCULOSKELETAL: No joint or muscle pain, no back pain, no recent trauma. DERMATOLOGIC: No rash, no itching, no lesions. ENDOCRINE: No polyuria, polydipsia, no heat or cold intolerance. No recent change in weight. HEMATOLOGICAL: No anemia or easy bruising or bleeding. NEUROLOGIC: No headache, seizures, numbness, tingling or weakness. PSYCHIATRIC: No depression, no loss of interest in normal activity or change in sleep pattern.     Exam: chaperone present  BP 134/78  Ht 5' 2.5" (1.588 m)  Wt 243 lb (110.224 kg)  BMI 43.71 kg/m2  Body mass index is 43.71 kg/(m^2).  General appearance : Well developed well nourished female. No acute distress HEENT: Neck supple, trachea midline, no carotid bruits, no thyroidmegaly Lungs: Clear to auscultation, no rhonchi or wheezes, or rib retractions  Heart: Regular rate and rhythm, no murmurs or gallops Breast:Examined in sitting and supine position were symmetrical in appearance, no palpable masses or tenderness,  no skin retraction, no nipple inversion, no nipple discharge, no skin discoloration, no axillary or supraclavicular lymphadenopathy Abdomen: no palpable masses or tenderness, no rebound or guarding Extremities: no edema or skin discoloration or tenderness  Pelvic:  Bartholin, Urethra, Skene Glands: Within normal limits             Vagina: No gross lesions or discharge  Cervix: Absent   Uterus: Absent  Adnexa absence  Anus and perineum  normal   Rectovaginal  normal sphincter tone without palpated masses or tenderness             Hemoccult PCP   will provide    Assessment/plan: Patient status post total abdominal hysterectomy with bilateral salpingo-oophorectomy and small bowel resection and  reanastomosis in 2014 for stage IA grade 1 endometrial cancer doing well. No evidence of recurrent disease. Pap smear was done today. A bone density study was scheduled. Patient was reminded of the importance of calcium and vitamin D in regular exercise for osteoporosis prevention. She will also schedule her mammogram for later this year.   Note: This dictation was prepared with  Dragon/digital dictation along withSmart phrase technology. Any transcriptional errors that result from this process are unintentional.   Terrance Mass MD, 8:04 AM 08/27/2013

## 2013-08-28 ENCOUNTER — Encounter: Payer: Self-pay | Admitting: Family Medicine

## 2013-08-28 ENCOUNTER — Encounter: Payer: Self-pay | Admitting: *Deleted

## 2013-09-13 ENCOUNTER — Other Ambulatory Visit: Payer: Self-pay | Admitting: Family Medicine

## 2013-09-18 ENCOUNTER — Other Ambulatory Visit: Payer: Self-pay | Admitting: Gynecology

## 2013-09-18 ENCOUNTER — Ambulatory Visit (INDEPENDENT_AMBULATORY_CARE_PROVIDER_SITE_OTHER): Payer: Medicare Other

## 2013-09-18 DIAGNOSIS — Z780 Asymptomatic menopausal state: Secondary | ICD-10-CM | POA: Diagnosis not present

## 2013-09-18 DIAGNOSIS — Z8542 Personal history of malignant neoplasm of other parts of uterus: Secondary | ICD-10-CM

## 2013-09-18 DIAGNOSIS — N951 Menopausal and female climacteric states: Secondary | ICD-10-CM | POA: Diagnosis not present

## 2013-09-26 ENCOUNTER — Other Ambulatory Visit: Payer: Self-pay | Admitting: Family Medicine

## 2013-10-04 ENCOUNTER — Ambulatory Visit: Payer: Self-pay | Admitting: Family Medicine

## 2013-10-04 DIAGNOSIS — Z1231 Encounter for screening mammogram for malignant neoplasm of breast: Secondary | ICD-10-CM | POA: Diagnosis not present

## 2013-10-05 ENCOUNTER — Encounter: Payer: Self-pay | Admitting: Family Medicine

## 2013-10-08 ENCOUNTER — Encounter: Payer: Self-pay | Admitting: *Deleted

## 2013-12-12 ENCOUNTER — Other Ambulatory Visit: Payer: Self-pay | Admitting: Family Medicine

## 2013-12-20 ENCOUNTER — Other Ambulatory Visit: Payer: Self-pay | Admitting: Family Medicine

## 2013-12-21 DIAGNOSIS — M719 Bursopathy, unspecified: Secondary | ICD-10-CM | POA: Diagnosis not present

## 2013-12-21 DIAGNOSIS — M545 Low back pain, unspecified: Secondary | ICD-10-CM | POA: Diagnosis not present

## 2013-12-21 DIAGNOSIS — M7710 Lateral epicondylitis, unspecified elbow: Secondary | ICD-10-CM | POA: Diagnosis not present

## 2013-12-21 DIAGNOSIS — M67919 Unspecified disorder of synovium and tendon, unspecified shoulder: Secondary | ICD-10-CM | POA: Diagnosis not present

## 2013-12-21 DIAGNOSIS — M47812 Spondylosis without myelopathy or radiculopathy, cervical region: Secondary | ICD-10-CM | POA: Diagnosis not present

## 2013-12-26 ENCOUNTER — Other Ambulatory Visit: Payer: Self-pay | Admitting: Family Medicine

## 2013-12-27 NOTE — Telephone Encounter (Signed)
Please refill times one  

## 2013-12-27 NOTE — Telephone Encounter (Signed)
Ok to refill 

## 2013-12-27 NOTE — Telephone Encounter (Signed)
done

## 2014-02-18 ENCOUNTER — Encounter: Payer: Self-pay | Admitting: Gynecology

## 2014-02-28 DIAGNOSIS — Z23 Encounter for immunization: Secondary | ICD-10-CM | POA: Diagnosis not present

## 2014-03-07 ENCOUNTER — Other Ambulatory Visit: Payer: Self-pay | Admitting: Family Medicine

## 2014-03-21 ENCOUNTER — Other Ambulatory Visit: Payer: Self-pay | Admitting: Family Medicine

## 2014-03-21 NOTE — Telephone Encounter (Signed)
appt scheduled and meds refilled 

## 2014-03-21 NOTE — Telephone Encounter (Signed)
Please schedule PE after April and refill until then

## 2014-03-21 NOTE — Telephone Encounter (Signed)
Electronic refill request, last CPE was in April 2015 and no future appt., please advise

## 2014-05-15 ENCOUNTER — Other Ambulatory Visit: Payer: Self-pay | Admitting: Family Medicine

## 2014-05-15 NOTE — Telephone Encounter (Signed)
ok to refill? cpe on 08/05/2014.

## 2014-05-15 NOTE — Telephone Encounter (Signed)
Please refill both to get through to appt  Thanks

## 2014-06-12 ENCOUNTER — Encounter: Payer: Self-pay | Admitting: Family Medicine

## 2014-06-12 ENCOUNTER — Ambulatory Visit (INDEPENDENT_AMBULATORY_CARE_PROVIDER_SITE_OTHER): Payer: Medicare Other | Admitting: Family Medicine

## 2014-06-12 VITALS — BP 142/72 | HR 64 | Temp 97.5°F | Ht 63.0 in | Wt 253.0 lb

## 2014-06-12 DIAGNOSIS — H918X3 Other specified hearing loss, bilateral: Secondary | ICD-10-CM

## 2014-06-12 DIAGNOSIS — H9191 Unspecified hearing loss, right ear: Secondary | ICD-10-CM | POA: Diagnosis not present

## 2014-06-12 DIAGNOSIS — H6120 Impacted cerumen, unspecified ear: Secondary | ICD-10-CM | POA: Insufficient documentation

## 2014-06-12 DIAGNOSIS — H6123 Impacted cerumen, bilateral: Secondary | ICD-10-CM | POA: Diagnosis not present

## 2014-06-12 MED ORDER — LISINOPRIL 5 MG PO TABS: 5.0000 mg | ORAL_TABLET | Freq: Every day | ORAL | Status: DC

## 2014-06-12 NOTE — Assessment & Plan Note (Signed)
This persisted (grossly) after simple ear irrigation  Will ref to ENT for eval  Possible tinnitus as well  Disc poss of sensorineural hearing loss and she would be interested in hearing aide if needed

## 2014-06-12 NOTE — Patient Instructions (Signed)
Ear wax impaction is better  Since hearing is not improved in right ear - I want to refer you to ear/nose/throat specialist Stop at check out and the ladies will work on that referral

## 2014-06-12 NOTE — Assessment & Plan Note (Signed)
Only slt imp after irrigation -will ref to ENT for further eval of R sided hearing loss

## 2014-06-12 NOTE — Progress Notes (Signed)
Subjective:    Patient ID: Valerie Ramirez, female    DOB: Mar 16, 1933, 81 y.o.   MRN: [id number]  HPI Here with decrease in hearing in R ear - about 1-2 months  No pain  Has some internal noise - like a ticking sound  L ear seems fine   Patient Active Problem List   Diagnosis Date Noted  . Colon cancer screening 08/10/2013  . Seborrheic keratoses, inflamed 08/10/2013  . Excessive cerumen in left ear canal 08/10/2013  . History of fall 04/10/2013  . Encounter for Medicare annual wellness exam 08/08/2012  . Gout 07/14/2012  . Endometrial cancer 05/25/2012  . Degenerative joint disease of cervical spine 05/03/2011  . Other screening mammogram 03/31/2011  . Hyperglycemia 09/26/2007  . OBESITY 07/12/2007  . GOITER, MULTINODULAR 07/11/2007  . HYPERTENSION 07/11/2007  . CARDIOMYOPATHY 07/11/2007  . OSTEOARTHRITIS 07/11/2007  . 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 HYPOTHYROIDISM NOS 10/05/2006  . HYPERCHOLESTEROLEMIA, PURE 10/05/2006   Past Medical History  Diagnosis Date  . Hypertension   . Hypothyroid   . Arthritis     OA  . Hyperlipidemia   . GERD (gastroesophageal reflux disease)   . Cancer     endometrial   Past Surgical History  Procedure Laterality Date  . Cholecystectomy    . Tubal ligation    . Knee arthroplasty  05/23/1998    total right  . Polyp removal  1999  . Hand surgery  12/2008    after hand fx/due to fall  . Joint replacement Bilateral 07/1998    knees  . Bone spur removed Right     shoulder  . Dilation and curettage of uterus  1999  . Eye surgery Bilateral     cataracts  . Laparotomy Bilateral 07/04/2012    Procedure: EXPLORATORY LAPAROTOMY TOTAL ABDOMINAL HYSTERECTOMY BILATERAL SALPINGO-OOPHORECTOMY with small bowel resection ;  Surgeon: Alvino Chapel, MD;  Location: [facility];  Service: Gynecology;  Laterality: Bilateral;  . Bowel resection  07/04/2012    Procedure: SMALL BOWEL RESECTION;  Surgeon: Alvino Chapel,  MD;  Location: [facility];  Service: Gynecology;;   History  Substance Use Topics  . Smoking status: Never Smoker   . Smokeless tobacco: Never Used  . Alcohol Use: No   Family History  Problem Relation Age of Onset  . Heart disease Sister     CABG  . Stroke Sister   . Heart disease Brother     CABG  . Cancer Brother     LEUKEMIA  . Cancer Brother     LUNG  . Cancer Brother     STOMACH   Allergies  Allergen Reactions  . Allopurinol Nausea Only  . Amoxicillin-Pot Clavulanate     REACTION: stomach upset  . Lipitor [Atorvastatin Calcium] Other (See Comments)    Leg ache and ache all over  . Simvastatin     REACTION: muscle pain  . Tape Itching and Rash    "Only the paper tape"   Current Outpatient Prescriptions on File Prior to Visit  Medication Sig Dispense Refill  . amLODipine (NORVASC) 5 MG tablet Take 1 tablet (5 mg total) by mouth daily. 90 tablet 3  . COLCRYS 0.6 MG tablet TAKE 1 TABLET (0.6 MG TOTAL) BY MOUTH 2 (TWO) TIMES DAILY. WITH FOOD AS NEEDED FOR GOUT 30 tablet 0  . COLCRYS 0.6 MG tablet TAKE 1 TABLET (0.6 MG TOTAL) BY MOUTH 2 (TWO) TIMES DAILY. WITH FOOD AS NEEDED FOR GOUT  30 tablet 0  . cyanocobalamin 500 MCG tablet Take by mouth as directed.     . diclofenac sodium (VOLTAREN) 1 % GEL Apply 2 g topically 4 (four) times daily. 100 g 1  . doxazosin (CARDURA) 8 MG tablet TAKE 1 TABLET (8 MG TOTAL) BY MOUTH AT BEDTIME. 90 tablet 1  . DULoxetine (CYMBALTA) 20 MG capsule TAKE 1 CAPSULE (20 MG TOTAL) BY MOUTH DAILY. 90 capsule 0  . lisinopril (PRINIVIL,ZESTRIL) 5 MG tablet Take 1 tablet (5 mg total) by mouth daily. *Needs to scheduled Physical appt. before future refills are given 90 tablet 0  . metoprolol (LOPRESSOR) 100 MG tablet TAKE 1 TABLET (100 MG TOTAL) BY MOUTH 2 (TWO) TIMES DAILY. 180 tablet 1   No current facility-administered medications on file prior to visit.      Review of Systems    Review of Systems  Constitutional: Negative for fever, appetite  change, fatigue and unexpected weight change.  Eyes: Negative for pain and visual disturbance.  ENT neg for ear pain or drainage/ sinus cong or pain or rhinorrhea  Respiratory: Negative for cough and shortness of breath.   Cardiovascular: Negative for cp or palpitations    Gastrointestinal: Negative for nausea, diarrhea and constipation.  Genitourinary: Negative for urgency and frequency.  Skin: Negative for pallor or rash   Neurological: Negative for weakness, light-headedness, numbness and headaches.  Hematological: Negative for adenopathy. Does not bruise/bleed easily.  Psychiatric/Behavioral: Negative for dysphoric mood. The patient is not nervous/anxious.      Objective:   Physical Exam  Constitutional: She appears well-developed and well-nourished. No distress.  obese and well appearing   HENT:  Head: Normocephalic and atraumatic.  Nose: Nose normal.  Mouth/Throat: Oropharynx is clear and moist. No oropharyngeal exudate.  RTM- cerumen impaction  L TM - partial cerumen impaction  Both resolved after simple ear irrigation - but no imp in gross hearing of R ear   Nares clear   Eyes: Conjunctivae and EOM are normal. Pupils are equal, round, and reactive to light. Right eye exhibits no discharge. Left eye exhibits no discharge. No scleral icterus.  Neurological: She is alert.  Skin: Skin is warm and dry. No rash noted. No erythema.  Psychiatric: She has a normal mood and affect.          Assessment & Plan:   Problem List Items Addressed This Visit      Nervous and Auditory   Hearing loss due to cerumen impaction - Primary    Only slt imp after irrigation -will ref to ENT for further eval of R sided hearing loss         Other   Hearing loss in right ear    This persisted (grossly) after simple ear irrigation  Will ref to ENT for eval  Possible tinnitus as well  Disc poss of sensorineural hearing loss and she would be interested in hearing aide if needed        Relevant Orders   Ambulatory referral to ENT

## 2014-06-12 NOTE — Progress Notes (Signed)
Pre visit review using our clinic review tool, if applicable. No additional management support is needed unless otherwise documented below in the visit note. 

## 2014-06-20 ENCOUNTER — Emergency Department (HOSPITAL_COMMUNITY): Payer: Medicare Other

## 2014-06-20 ENCOUNTER — Emergency Department (HOSPITAL_COMMUNITY)
Admission: EM | Admit: 2014-06-20 | Discharge: 2014-06-20 | Disposition: A | Discharge status: Home / Self Care | Payer: Medicare Other | Attending: Emergency Medicine | Admitting: Emergency Medicine

## 2014-06-20 ENCOUNTER — Encounter (HOSPITAL_COMMUNITY): Payer: Self-pay | Admitting: Emergency Medicine

## 2014-06-20 DIAGNOSIS — Z8542 Personal history of malignant neoplasm of other parts of uterus: Secondary | ICD-10-CM | POA: Insufficient documentation

## 2014-06-20 DIAGNOSIS — Z8719 Personal history of other diseases of the digestive system: Secondary | ICD-10-CM | POA: Insufficient documentation

## 2014-06-20 DIAGNOSIS — S29012A Strain of muscle and tendon of back wall of thorax, initial encounter: Secondary | ICD-10-CM | POA: Diagnosis not present

## 2014-06-20 DIAGNOSIS — N390 Urinary tract infection, site not specified: Secondary | ICD-10-CM | POA: Diagnosis not present

## 2014-06-20 DIAGNOSIS — Z79899 Other long term (current) drug therapy: Secondary | ICD-10-CM | POA: Insufficient documentation

## 2014-06-20 DIAGNOSIS — R1030 Lower abdominal pain, unspecified: Secondary | ICD-10-CM | POA: Diagnosis not present

## 2014-06-20 DIAGNOSIS — N289 Disorder of kidney and ureter, unspecified: Secondary | ICD-10-CM | POA: Diagnosis not present

## 2014-06-20 DIAGNOSIS — Y9389 Activity, other specified: Secondary | ICD-10-CM | POA: Insufficient documentation

## 2014-06-20 DIAGNOSIS — Y9289 Other specified places as the place of occurrence of the external cause: Secondary | ICD-10-CM | POA: Insufficient documentation

## 2014-06-20 DIAGNOSIS — Z87442 Personal history of urinary calculi: Secondary | ICD-10-CM | POA: Diagnosis not present

## 2014-06-20 DIAGNOSIS — Z880 Allergy status to penicillin: Secondary | ICD-10-CM | POA: Diagnosis not present

## 2014-06-20 DIAGNOSIS — M1990 Unspecified osteoarthritis, unspecified site: Secondary | ICD-10-CM | POA: Diagnosis not present

## 2014-06-20 DIAGNOSIS — R319 Hematuria, unspecified: Secondary | ICD-10-CM | POA: Diagnosis not present

## 2014-06-20 DIAGNOSIS — I10 Essential (primary) hypertension: Secondary | ICD-10-CM | POA: Insufficient documentation

## 2014-06-20 DIAGNOSIS — Y998 Other external cause status: Secondary | ICD-10-CM | POA: Diagnosis not present

## 2014-06-20 DIAGNOSIS — Z8639 Personal history of other endocrine, nutritional and metabolic disease: Secondary | ICD-10-CM | POA: Insufficient documentation

## 2014-06-20 DIAGNOSIS — S29019A Strain of muscle and tendon of unspecified wall of thorax, initial encounter: Secondary | ICD-10-CM | POA: Insufficient documentation

## 2014-06-20 DIAGNOSIS — X58XXXA Exposure to other specified factors, initial encounter: Secondary | ICD-10-CM | POA: Insufficient documentation

## 2014-06-20 DIAGNOSIS — Z791 Long term (current) use of non-steroidal anti-inflammatories (NSAID): Secondary | ICD-10-CM | POA: Diagnosis not present

## 2014-06-20 DIAGNOSIS — R109 Unspecified abdominal pain: Secondary | ICD-10-CM

## 2014-06-20 LAB — BASIC METABOLIC PANEL
Anion gap: 10 (ref 5–15)
BUN: 18 mg/dL (ref 6–23)
CO2: 24 mmol/L (ref 19–32)
Calcium: 9.1 mg/dL (ref 8.4–10.5)
Chloride: 104 mmol/L (ref 96–112)
Creatinine, Ser: 1.16 mg/dL — ABNORMAL HIGH (ref 0.50–1.10)
GFR calc Af Amer: 51 mL/min — ABNORMAL LOW (ref >90)
GFR calc non Af Amer: 44 mL/min — ABNORMAL LOW (ref >90)
Glucose, Bld: 128 mg/dL — ABNORMAL HIGH (ref 70–99)
Potassium: 4 mmol/L (ref 3.5–5.1)
Sodium: 138 mmol/L (ref 135–145)

## 2014-06-20 LAB — CBC WITH DIFFERENTIAL/PLATELET
Basophils Absolute: 0 10*3/uL (ref 0.0–0.1)
Basophils Relative: 0 % (ref 0–1)
Eosinophils Absolute: 0.1 10*3/uL (ref 0.0–0.7)
Eosinophils Relative: 1 % (ref 0–5)
HCT: 39.1 % (ref 36.0–46.0)
Hemoglobin: 12.4 g/dL (ref 12.0–15.0)
Lymphocytes Relative: 18 % (ref 12–46)
Lymphs Abs: 1.8 10*3/uL (ref 0.7–4.0)
MCH: 27.4 pg (ref 26.0–34.0)
MCHC: 31.7 g/dL (ref 30.0–36.0)
MCV: 86.3 fL (ref 78.0–100.0)
Monocytes Absolute: 1 10*3/uL (ref 0.1–1.0)
Monocytes Relative: 10 % (ref 3–12)
Neutro Abs: 7.1 10*3/uL (ref 1.7–7.7)
Neutrophils Relative %: 71 % (ref 43–77)
Platelets: 172 10*3/uL (ref 150–400)
RBC: 4.53 MIL/uL (ref 3.87–5.11)
RDW: 16.6 % — ABNORMAL HIGH (ref 11.5–15.5)
WBC: 10.1 10*3/uL (ref 4.0–10.5)

## 2014-06-20 LAB — URINALYSIS, ROUTINE W REFLEX MICROSCOPIC
Bilirubin Urine: NEGATIVE
Glucose, UA: NEGATIVE mg/dL
Ketones, ur: NEGATIVE mg/dL
Nitrite: POSITIVE — AB
Protein, ur: 30 mg/dL — AB
Specific Gravity, Urine: 1.008 (ref 1.005–1.030)
Urobilinogen, UA: 1 mg/dL (ref 0.0–1.0)
pH: 6 (ref 5.0–8.0)

## 2014-06-20 LAB — URINE MICROSCOPIC-ADD ON

## 2014-06-20 MED ORDER — CEPHALEXIN 500 MG PO CAPS: 500.0000 mg | ORAL_CAPSULE | Freq: Two times a day (BID) | ORAL | Status: DC

## 2014-06-20 MED ORDER — SODIUM CHLORIDE 0.9 % IV SOLN
Freq: Once | INTRAVENOUS | Status: AC
  Administered 2014-06-20: 11:00:00 via INTRAVENOUS

## 2014-06-20 MED ORDER — FENTANYL CITRATE 0.05 MG/ML IJ SOLN
50.0000 ug | Freq: Once | INTRAMUSCULAR | Status: AC
  Administered 2014-06-20: 50 ug via INTRAVENOUS
  Filled 2014-06-20: qty 2

## 2014-06-20 MED ORDER — LISINOPRIL 5 MG PO TABS
5.0000 mg | ORAL_TABLET | Freq: Once | ORAL | Status: AC
  Administered 2014-06-20: 5 mg via ORAL
  Filled 2014-06-20: qty 1

## 2014-06-20 MED ORDER — HYDROCODONE-ACETAMINOPHEN 5-325 MG PO TABS: 1.0000 | ORAL_TABLET | ORAL | Status: DC | PRN

## 2014-06-20 MED ORDER — METOPROLOL TARTRATE 25 MG PO TABS
100.0000 mg | ORAL_TABLET | Freq: Once | ORAL | Status: AC
  Administered 2014-06-20: 100 mg via ORAL
  Filled 2014-06-20: qty 4

## 2014-06-20 MED ORDER — DEXTROSE 5 % IV SOLN
1.0000 g | Freq: Once | INTRAVENOUS | Status: AC
  Administered 2014-06-20: 1 g via INTRAVENOUS
  Filled 2014-06-20: qty 10

## 2014-06-20 MED ORDER — AMLODIPINE BESYLATE 5 MG PO TABS
5.0000 mg | ORAL_TABLET | Freq: Once | ORAL | Status: AC
  Administered 2014-06-20: 5 mg via ORAL
  Filled 2014-06-20: qty 1

## 2014-06-20 NOTE — ED Provider Notes (Signed)
TIME SEEN: 10:20 AM  CHIEF COMPLAINT: Left flank pain  HPI: Pt is a 81 y.o. female with history of hypertension, hypothyroidism, hyperlipidemia, prior kidney stones, endometrial cancer who presents to the emergency department with complaints of left-sided sharp moderate flank pain that started on Monday, 3 days ago. Radiates into the lower abdomen. Pain worse with palpation and movement. No alleviating factors. Patient denies any back injury. No numbness, tingling or focal weakness. No bowel or bladder incontinence. Denies dysuria or hematuria. States that this does feel somewhat similar to her prior kidney stones.  ROS: See HPI Constitutional: no fever  Eyes: no drainage  ENT: no runny nose   Cardiovascular:  no chest pain  Resp: no SOB  GI: no vomiting GU: no dysuria Integumentary: no rash  Allergy: no hives  Musculoskeletal: no leg swelling  Neurological: no slurred speech ROS otherwise negative  PAST MEDICAL HISTORY/PAST SURGICAL HISTORY:  Past Medical History  Diagnosis Date  . Hypertension   . Hypothyroid   . Arthritis     OA  . Hyperlipidemia   . GERD (gastroesophageal reflux disease)   . Cancer     endometrial  . Kidney stone     MEDICATIONS:  Prior to Admission medications   Medication Sig Start Date End Date Taking? Authorizing Provider  amLODipine (NORVASC) 5 MG tablet Take 1 tablet (5 mg total) by mouth daily. 08/08/12   Marne A Tower, MD  COLCRYS 0.6 MG tablet TAKE 1 TABLET (0.6 MG TOTAL) BY MOUTH 2 (TWO) TIMES DAILY. WITH FOOD AS NEEDED FOR GOUT 12/27/13   Marne A Tower, MD  COLCRYS 0.6 MG tablet TAKE 1 TABLET (0.6 MG TOTAL) BY MOUTH 2 (TWO) TIMES DAILY. WITH FOOD AS NEEDED FOR GOUT 05/15/14   Abner Greenspan, MD  cyanocobalamin 500 MCG tablet Take by mouth as directed.     Historical Provider, MD  diclofenac sodium (VOLTAREN) 1 % GEL Apply 2 g topically 4 (four) times daily. 10/02/12   Tonia Ghent, MD  doxazosin (CARDURA) 8 MG tablet TAKE 1 TABLET (8 MG TOTAL)  BY MOUTH AT BEDTIME. 03/21/14   Abner Greenspan, MD  DULoxetine (CYMBALTA) 20 MG capsule TAKE 1 CAPSULE (20 MG TOTAL) BY MOUTH DAILY. 05/15/14   Abner Greenspan, MD  lisinopril (PRINIVIL,ZESTRIL) 5 MG tablet Take 1 tablet (5 mg total) by mouth daily. 06/12/14   Abner Greenspan, MD  metoprolol (LOPRESSOR) 100 MG tablet TAKE 1 TABLET (100 MG TOTAL) BY MOUTH 2 (TWO) TIMES DAILY. 03/21/14   Abner Greenspan, MD    ALLERGIES:  Allergies  Allergen Reactions  . Allopurinol Nausea Only  . Amoxicillin-Pot Clavulanate     REACTION: stomach upset  . Lipitor [Atorvastatin Calcium] Other (See Comments)    Leg ache and ache all over  . Simvastatin     REACTION: muscle pain  . Tape Itching and Rash    "Only the paper tape"    SOCIAL HISTORY:  History  Substance Use Topics  . Smoking status: Never Smoker   . Smokeless tobacco: Never Used  . Alcohol Use: No    FAMILY HISTORY: Family History  Problem Relation Age of Onset  . Heart disease Sister     CABG  . Stroke Sister   . Heart disease Brother     CABG  . Cancer Brother     LEUKEMIA  . Cancer Brother     LUNG  . Cancer Brother     STOMACH    EXAM:  BP 193/66 mmHg  Pulse 71  Temp(Src) 97.5 F (36.4 C) (Oral)  Resp 19  SpO2 98% CONSTITUTIONAL: Alert and oriented and responds appropriately to questions. Well-appearing; well-nourished, elderly, smiling, nontoxic, not in distress HEAD: Normocephalic EYES: Conjunctivae clear, PERRL ENT: normal nose; no rhinorrhea; moist mucous membranes; pharynx without lesions noted NECK: Supple, no meningismus, no LAD  CARD: RRR; S1 and S2 appreciated; no murmurs, no clicks, no rubs, no gallops RESP: Normal chest excursion without splinting or tachypnea; breath sounds clear and equal bilaterally; no wheezes, no rhonchi, no rales ABD/GI: Normal bowel sounds; non-distended; soft, non-tender, no rebound, no guarding BACK:  The back appears normal and is tender to palpation in the left flank without obvious  deformity or lesions, no midline spinal tennis or step-off or deformity EXT: Normal ROM in all joints; non-tender to palpation; no edema; normal capillary refill; no cyanosis    SKIN: Normal color for age and race; warm NEURO: Moves all extremities equally, normal gait, sensation to light touch intact diffusely PSYCH: The patient's mood and manner are appropriate. Grooming and personal hygiene are appropriate.  MEDICAL DECISION MAKING: Patient here with left flank pain. May be musculoskeletal in nature but does have a history of kidney stones. Nontoxic-appearing, afebrile, hemodynamically stable. Patient is hypertensive but has not had her blood pressure medication today and she is in pain. I will give her her blood pressure medications, pain medication. Will obtain a CT of her abdomen and pelvis without contrast, labs, urine.  ED PROGRESS: Patient reports feeling much better. She does appear to have a nitrite-positive UTI. Culture pending. We'll discharge home on Keflex. CT scan shows no acute abnormalities and no stone. Suspect she has a UTI as well as musculoskeletal flank pain. We'll discharge with prescription to take Vicodin as needed for pain. Discussed return precautions. Family at bedside and patient both verbalized understanding and are comfortable with this plan.     [place], DO 06/20/14 1257

## 2014-06-20 NOTE — ED Notes (Signed)
Pt alert and oriented x4. Respirations even and unlabored, bilateral symmetrical rise and fall of chest. Skin warm and dry. In no acute distress. Denies needs.   

## 2014-06-20 NOTE — Discharge Instructions (Signed)
Thoracic Strain You have injured the muscles or tendons that attach to the upper part of your back behind your chest. This injury is called a thoracic strain, thoracic sprain, or mid-back strain.  CAUSES  The cause of thoracic strain varies. A less severe injury involves pulling a muscle or tendon without tearing it. A more severe injury involves tearing (rupturing) a muscle or tendon. With less severe injuries, there may be little loss of strength. Sometimes, there are breaks (fractures) in the bones to which the muscles are attached. These fractures are rare, unless there was a direct hit (trauma) or you have weak bones due to osteoporosis or age. Longstanding strains may be caused by overuse or improper form during certain movements. Obesity can also increase your risk for back injuries. Sudden strains may occur due to injury or not warming up properly before exercise. Often, there is no obvious cause for a thoracic strain. SYMPTOMS  The main symptom is pain, especially with movement, such as during exercise. DIAGNOSIS  Your caregiver can usually tell what is wrong by taking an X-ray and doing a physical exam. TREATMENT   Physical therapy may be helpful for recovery. Your caregiver can give you exercises to do or refer you to a physical therapist after your pain improves.  After your pain improves, strengthening and conditioning programs appropriate for your sport or occupation may be helpful.  Always warm up before physical activities or athletics. Stretching after physical activity may also help.  Certain over-the-counter medicines may also help. Ask your caregiver if there are medicines that would help you. If this is your first thoracic strain injury, proper care and proper healing time before starting activities should prevent long-term problems. Torn ligaments and tendons require as long to heal as broken bones. Average healing times may be only 1 week for a mild strain. For torn muscles  and tendons, healing time may be up to 6 weeks to 2 months. HOME CARE INSTRUCTIONS   Apply ice to the injured area. Ice massages may also be used as directed.  Put ice in a plastic bag.  Place a towel between your skin and the bag.  Leave the ice on for 15-20 minutes, 03-04 times a day, for the first 2 days.  Only take over-the-counter or prescription medicines for pain, discomfort, or fever as directed by your caregiver.  Keep your appointments for physical therapy if this was prescribed.  Use wraps and back braces as instructed. SEEK IMMEDIATE MEDICAL CARE IF:   You have an increase in bruising, swelling, or pain.  Your pain has not improved with medicines.  You develop new shortness of breath, chest pain, or fever.  Problems seem to be getting worse rather than better. MAKE SURE YOU:   Understand these instructions.  Will watch your condition.  Will get help right away if you are not doing well or get worse. Document Released: 06/26/2003 Document Revised: 06/28/2011 Document Reviewed: 05/22/2010 [facility] Patient Information 2015 [place], [place]. This information is not intended to replace advice given to you by your health care provider. Make sure you discuss any questions you have with your health care provider.    Urinary Tract Infection Urinary tract infections (UTIs) can develop anywhere along your urinary tract. Your urinary tract is your body's drainage system for removing wastes and extra water. Your urinary tract includes two kidneys, two ureters, a bladder, and a urethra. Your kidneys are a pair of bean-shaped organs. Each kidney is about the size of your  fist. They are located below your ribs, one on each side of your spine. CAUSES Infections are caused by microbes, which are microscopic organisms, including fungi, viruses, and bacteria. These organisms are so small that they can only be seen through a microscope. Bacteria are the microbes that most commonly cause  UTIs. SYMPTOMS  Symptoms of UTIs may vary by age and gender of the patient and by the location of the infection. Symptoms in young women typically include a frequent and intense urge to urinate and a painful, burning feeling in the bladder or urethra during urination. Older women and men are more likely to be tired, shaky, and weak and have muscle aches and abdominal pain. A fever may mean the infection is in your kidneys. Other symptoms of a kidney infection include pain in your back or sides below the ribs, nausea, and vomiting. DIAGNOSIS To diagnose a UTI, your caregiver will ask you about your symptoms. Your caregiver also will ask to provide a urine sample. The urine sample will be tested for bacteria and white blood cells. White blood cells are made by your body to help fight infection. TREATMENT  Typically, UTIs can be treated with medication. Because most UTIs are caused by a bacterial infection, they usually can be treated with the use of antibiotics. The choice of antibiotic and length of treatment depend on your symptoms and the type of bacteria causing your infection. HOME CARE INSTRUCTIONS  If you were prescribed antibiotics, take them exactly as your caregiver instructs you. Finish the medication even if you feel better after you have only taken some of the medication.  Drink enough water and fluids to keep your urine clear or pale yellow.  Avoid caffeine, tea, and carbonated beverages. They tend to irritate your bladder.  Empty your bladder often. Avoid holding urine for long periods of time.  Empty your bladder before and after sexual intercourse.  After a bowel movement, women should cleanse from front to back. Use each tissue only once. SEEK MEDICAL CARE IF:   You have back pain.  You develop a fever.  Your symptoms do not begin to resolve within 3 days. SEEK IMMEDIATE MEDICAL CARE IF:   You have severe back pain or lower abdominal pain.  You develop chills.  You  have nausea or vomiting.  You have continued burning or discomfort with urination. MAKE SURE YOU:   Understand these instructions.  Will watch your condition.  Will get help right away if you are not doing well or get worse. Document Released: 01/13/2005 Document Revised: 10/05/2011 Document Reviewed: 05/14/2011 [facility] Patient Information 2015 [place], [place]. This information is not intended to replace advice given to you by your health care provider. Make sure you discuss any questions you have with your health care provider.

## 2014-06-20 NOTE — ED Notes (Signed)
Pt states that she has been having severe lt flank pain since Monday.  Hx of kidney stones.  Denies dysuria.  Pt hypertensive in triage.  States that she has not taken HTN meds today.

## 2014-06-21 LAB — URINE CULTURE: Colony Count: >=100000

## 2014-06-24 ENCOUNTER — Other Ambulatory Visit: Payer: Self-pay | Admitting: Family Medicine

## 2014-06-25 DIAGNOSIS — H903 Sensorineural hearing loss, bilateral: Secondary | ICD-10-CM | POA: Diagnosis not present

## 2014-07-24 DIAGNOSIS — H6123 Impacted cerumen, bilateral: Secondary | ICD-10-CM | POA: Diagnosis not present

## 2014-07-24 DIAGNOSIS — H903 Sensorineural hearing loss, bilateral: Secondary | ICD-10-CM | POA: Diagnosis not present

## 2014-08-04 ENCOUNTER — Telehealth: Payer: Self-pay | Admitting: Family Medicine

## 2014-08-04 DIAGNOSIS — I10 Essential (primary) hypertension: Secondary | ICD-10-CM

## 2014-08-04 DIAGNOSIS — E039 Hypothyroidism, unspecified: Secondary | ICD-10-CM

## 2014-08-04 DIAGNOSIS — R739 Hyperglycemia, unspecified: Secondary | ICD-10-CM

## 2014-08-04 DIAGNOSIS — E7800 Pure hypercholesterolemia, unspecified: Secondary | ICD-10-CM

## 2014-08-04 NOTE — Telephone Encounter (Signed)
-----   Message from Ellamae Sia sent at 08/01/2014  2:38 PM EDT ----- Regarding: Lab orders for Monday, 4.18.16 Patient is scheduled for CPX labs, please order future labs, Thanks , Karna Christmas

## 2014-08-05 ENCOUNTER — Other Ambulatory Visit (INDEPENDENT_AMBULATORY_CARE_PROVIDER_SITE_OTHER): Payer: Medicare Other

## 2014-08-05 DIAGNOSIS — E039 Hypothyroidism, unspecified: Secondary | ICD-10-CM | POA: Diagnosis not present

## 2014-08-05 DIAGNOSIS — E7800 Pure hypercholesterolemia, unspecified: Secondary | ICD-10-CM

## 2014-08-05 DIAGNOSIS — R739 Hyperglycemia, unspecified: Secondary | ICD-10-CM

## 2014-08-05 DIAGNOSIS — I10 Essential (primary) hypertension: Secondary | ICD-10-CM | POA: Diagnosis not present

## 2014-08-05 LAB — CBC WITH DIFFERENTIAL/PLATELET
Basophils Absolute: 0 10*3/uL (ref 0.0–0.1)
Basophils Relative: 0.5 % (ref 0.0–3.0)
Eosinophils Absolute: 0.2 10*3/uL (ref 0.0–0.7)
Eosinophils Relative: 2.3 % (ref 0.0–5.0)
HCT: 34.3 % — ABNORMAL LOW (ref 36.0–46.0)
Hemoglobin: 11.3 g/dL — ABNORMAL LOW (ref 12.0–15.0)
Lymphocytes Relative: 30 % (ref 12.0–46.0)
Lymphs Abs: 2.6 10*3/uL (ref 0.7–4.0)
MCHC: 33 g/dL (ref 30.0–36.0)
MCV: 82.1 fl (ref 78.0–100.0)
Monocytes Absolute: 0.9 10*3/uL (ref 0.1–1.0)
Monocytes Relative: 10.7 % (ref 3.0–12.0)
Neutro Abs: 4.8 10*3/uL (ref 1.4–7.7)
Neutrophils Relative %: 56.5 % (ref 43.0–77.0)
Platelets: 205 10*3/uL (ref 150.0–400.0)
RBC: 4.18 Mil/uL (ref 3.87–5.11)
RDW: 18.7 % — ABNORMAL HIGH (ref 11.5–15.5)
WBC: 8.5 10*3/uL (ref 4.0–10.5)

## 2014-08-05 LAB — COMPREHENSIVE METABOLIC PANEL
ALT: 9 U/L (ref 0–35)
AST: 12 U/L (ref 0–37)
Albumin: 3.5 g/dL (ref 3.5–5.2)
Alkaline Phosphatase: 81 U/L (ref 39–117)
BUN: 20 mg/dL (ref 6–23)
CO2: 26 mEq/L (ref 19–32)
Calcium: 9 mg/dL (ref 8.4–10.5)
Chloride: 106 mEq/L (ref 96–112)
Creatinine, Ser: 1.22 mg/dL — ABNORMAL HIGH (ref 0.40–1.20)
GFR: 45.08 mL/min — ABNORMAL LOW (ref >60.00)
Glucose, Bld: 131 mg/dL — ABNORMAL HIGH (ref 70–99)
Potassium: 4.3 mEq/L (ref 3.5–5.1)
Sodium: 139 mEq/L (ref 135–145)
Total Bilirubin: 0.3 mg/dL (ref 0.2–1.2)
Total Protein: 6.7 g/dL (ref 6.0–8.3)

## 2014-08-05 LAB — LIPID PANEL
Cholesterol: 192 mg/dL (ref 0–200)
HDL: 50.3 mg/dL (ref >39.00)
LDL Cholesterol: 118 mg/dL — ABNORMAL HIGH (ref 0–99)
NonHDL: 141.7
Total CHOL/HDL Ratio: 4
Triglycerides: 118 mg/dL (ref 0.0–149.0)
VLDL: 23.6 mg/dL (ref 0.0–40.0)

## 2014-08-05 LAB — HEMOGLOBIN A1C: Hgb A1c MFr Bld: 6.9 % — ABNORMAL HIGH (ref 4.6–6.5)

## 2014-08-05 LAB — TSH: TSH: 3.96 u[IU]/mL (ref 0.35–4.50)

## 2014-08-12 ENCOUNTER — Telehealth: Payer: Self-pay | Admitting: Family Medicine

## 2014-08-12 ENCOUNTER — Ambulatory Visit (INDEPENDENT_AMBULATORY_CARE_PROVIDER_SITE_OTHER)
Admission: RE | Admit: 2014-08-12 | Discharge: 2014-08-12 | Disposition: A | Discharge status: Home / Self Care | Payer: Medicare Other | Source: Ambulatory Visit | Attending: Family Medicine | Admitting: Family Medicine

## 2014-08-12 ENCOUNTER — Encounter: Payer: Self-pay | Admitting: Family Medicine

## 2014-08-12 ENCOUNTER — Ambulatory Visit (INDEPENDENT_AMBULATORY_CARE_PROVIDER_SITE_OTHER): Payer: Medicare Other | Admitting: Family Medicine

## 2014-08-12 VITALS — BP 138/80 | HR 63 | Temp 98.1°F | Ht 62.5 in | Wt 248.8 lb

## 2014-08-12 DIAGNOSIS — M25552 Pain in left hip: Secondary | ICD-10-CM

## 2014-08-12 DIAGNOSIS — R739 Hyperglycemia, unspecified: Secondary | ICD-10-CM

## 2014-08-12 DIAGNOSIS — E039 Hypothyroidism, unspecified: Secondary | ICD-10-CM | POA: Diagnosis not present

## 2014-08-12 DIAGNOSIS — Z0000 Encounter for general adult medical examination without abnormal findings: Secondary | ICD-10-CM

## 2014-08-12 DIAGNOSIS — Z23 Encounter for immunization: Secondary | ICD-10-CM | POA: Diagnosis not present

## 2014-08-12 DIAGNOSIS — Z1211 Encounter for screening for malignant neoplasm of colon: Secondary | ICD-10-CM

## 2014-08-12 DIAGNOSIS — E7800 Pure hypercholesterolemia, unspecified: Secondary | ICD-10-CM

## 2014-08-12 DIAGNOSIS — E669 Obesity, unspecified: Secondary | ICD-10-CM | POA: Diagnosis not present

## 2014-08-12 DIAGNOSIS — M160 Bilateral primary osteoarthritis of hip: Secondary | ICD-10-CM

## 2014-08-12 DIAGNOSIS — I10 Essential (primary) hypertension: Secondary | ICD-10-CM | POA: Diagnosis not present

## 2014-08-12 DIAGNOSIS — M1612 Unilateral primary osteoarthritis, left hip: Secondary | ICD-10-CM | POA: Diagnosis not present

## 2014-08-12 MED ORDER — METOPROLOL TARTRATE 100 MG PO TABS: ORAL_TABLET | ORAL | Status: DC

## 2014-08-12 MED ORDER — DULOXETINE HCL 20 MG PO CPEP: ORAL_CAPSULE | ORAL | Status: DC

## 2014-08-12 MED ORDER — DOXAZOSIN MESYLATE 8 MG PO TABS: ORAL_TABLET | ORAL | Status: DC

## 2014-08-12 MED ORDER — AMLODIPINE BESYLATE 5 MG PO TABS: 5.0000 mg | ORAL_TABLET | Freq: Every day | ORAL | Status: DC

## 2014-08-12 MED ORDER — LISINOPRIL 5 MG PO TABS: 5.0000 mg | ORAL_TABLET | Freq: Every day | ORAL | Status: DC

## 2014-08-12 NOTE — Progress Notes (Signed)
Pre visit review using our clinic review tool, if applicable. No additional management support is needed unless otherwise documented below in the visit note. 

## 2014-08-12 NOTE — Telephone Encounter (Signed)
Pt states that she is returning your call. Please call back at the home number. Thanks.

## 2014-08-12 NOTE — Assessment & Plan Note (Signed)
Lab Results  Component Value Date   HGBA1C 6.9* 08/05/2014   This is new - DM2 Pt is obese and has family hx  Disc imp of wt loss and low glycemic diet  F/u 3 mo after working on lifestyle change-handouts given Will disc ref to DM teaching at f/u

## 2014-08-12 NOTE — Assessment & Plan Note (Signed)
Hypothyroidism  Pt has no clinical changes No change in energy level/ hair or skin/ edema and no tremor Lab Results  Component Value Date   TSH 3.96 08/05/2014

## 2014-08-12 NOTE — Patient Instructions (Addendum)
Xray of hip today  Please do IFOB stool cards for colon cancer screening  Don't forget to follow up with your gyn doctor  prevnar vaccine today  If you are interested in a shingles/zoster vaccine - call your insurance to check on coverage,( you should not get it within 1 month of other vaccines) , then call us for a prescription  for it to take to a pharmacy that gives the shot , or make a nurse visit to get it here depending on your coverage  You have new diabetes type 2  Please limit sugar in diet as much as possible and work on weight loss   Follow up in 3 months with labs prior

## 2014-08-12 NOTE — Assessment & Plan Note (Signed)
Given IFOB stool cards  No symptoms

## 2014-08-12 NOTE — Assessment & Plan Note (Signed)
Fair control with diet Disc goals for lipids and reasons to control them Rev labs with pt Rev low sat fat diet in detail  F/u 3 mo

## 2014-08-12 NOTE — Telephone Encounter (Signed)
-----   Message from Pete Pelt, LPN sent at 7/84/1282  4:32 PM EDT ----- Patient notified as instructed by telephone and verbalized understanding. Patient stated that she has seen Dr. Lorin Mercy at [facility] and would like to see him. Advised patient that the referral coordinator will be in touch with her to get this set up.

## 2014-08-12 NOTE — Assessment & Plan Note (Signed)
Pt needs to loose wt for multiple health problems and joint/back pain  Discussed how this problem influences overall health and the risks it imposes  Reviewed plan for weight loss with lower calorie diet (via better food choices and also portion control or program like weight watchers) and exercise building up to or more than 30 minutes 5 days per week including some aerobic activity

## 2014-08-12 NOTE — Assessment & Plan Note (Signed)
bp in fair control at this time  BP Readings from Last 1 Encounters:  08/12/14 138/80   No changes needed Disc lifstyle change with low sodium diet and exercise  Need for wt loss discussed

## 2014-08-12 NOTE — Telephone Encounter (Signed)
Called patient back and gave her test results per Dr. Glori Bickers.

## 2014-08-12 NOTE — Assessment & Plan Note (Signed)
Reviewed health habits including diet and exercise and skin cancer prevention Reviewed appropriate screening tests for age  Also reviewed health mt list, fam hx and immunization status , as well as social and family history   See HPI Labs reviewed Please do IFOB stool cards for colon cancer screening  Don't forget to follow up with your gyn doctor  prevnar vaccine today  If you are interested in a shingles/zoster vaccine - call your insurance to check on coverage,( you should not get it within 1 month of other vaccines) , then call us for a prescription  for it to take to a pharmacy that gives the shot , or make a nurse visit to get it here depending on your coverage

## 2014-08-12 NOTE — Progress Notes (Signed)
Subjective:    Patient ID: Valerie Ramirez, female    DOB: 1932-09-30, 81 y.o.   MRN: [id number]  HPI Here for annual medicare wellness visit as well as chronic/acute medical problems   I have personally reviewed the Medicare Annual Wellness questionnaire and have noted 1. The patient's medical and social history 2. Their use of alcohol, tobacco or illicit drugs 3. Their current medications and supplements 4. The patient's functional ability including ADL's, fall risks, home safety risks and hearing or visual             impairment. 5. Diet and physical activities 6. Evidence for depression or mood disorders  The patients weight, height, BMI have been recorded in the chart and visual acuity is per eye clinic.  I have made referrals, counseling and provided education to the patient based review of the above and I have provided the pt with a written personalized care plan for preventive services. Reviewed and updated provider list, see scanned forms.  See scanned forms.  Routine anticipatory guidance given to patient.  See health maintenance. Colon cancer screening - declines colonoscopy ,  Breast cancer screening mammogram 6/15 - she will schedule her mammogram this June- she will schedule  Self breast exam- no lumps  Has not been to the gyn lately - send a reminder - will make that appt/ hx of endometrial cancer  Flu vaccine 11/15  Tetanus vaccine 7/13 Pneumovax 3/14 - will get prevnar today  Zoster vaccine - pt states gyn wrote px for it  dexa 6/15- in normal range  Advance directive - has a Living will and POA written up  Cognitive function addressed- see scanned forms- and if abnormal then additional documentation follows.  No memory concerns   Has had a fall - "nothing major" -stubbed her toe   PMH and SH reviewed  Meds, vitals, and allergies reviewed.   ROS: See HPI.  Otherwise negative.    Wt is down 5 lb with bmi of 44  bp is stable today  No cp or palpitations  or headaches or edema  No side effects to medicines  BP Readings from Last 3 Encounters:  08/12/14 144/78  06/20/14 164/59  06/12/14 142/72       Chemistry      Component Value Date/Time   NA 139 08/05/2014 0844   K 4.3 08/05/2014 0844   CL 106 08/05/2014 0844   CO2 26 08/05/2014 0844   BUN 20 08/05/2014 0844   CREATININE 1.22* 08/05/2014 0844      Component Value Date/Time   CALCIUM 9.0 08/05/2014 0844   ALKPHOS 81 08/05/2014 0844   AST 12 08/05/2014 0844   ALT 9 08/05/2014 0844   BILITOT 0.3 08/05/2014 0844     she drinks quite a bit of water   Lab Results  Component Value Date   WBC 8.5 08/05/2014   HGB 11.3* 08/05/2014   HCT 34.3* 08/05/2014   MCV 82.1 08/05/2014   PLT 205.0 08/05/2014   no recent surg Hx of endometrial cancer -but no recent surg    Hyperglycemia Lab Results  Component Value Date   HGBA1C 6.9* 08/05/2014   6.0 prev  She does stay thirsty  She does eat a lot of sugar    Cholesterol Lab Results  Component Value Date   CHOL 192 08/05/2014   CHOL 189 08/03/2013   CHOL 201* 07/28/2012   Lab Results  Component Value Date   HDL 50.30 08/05/2014   HDL  47.90 08/03/2013   HDL 40.00 07/28/2012   Lab Results  Component Value Date   LDLCALC 118* 08/05/2014   LDLCALC 111* 08/03/2013   LDLCALC 109* 10/26/2011   Lab Results  Component Value Date   TRIG 118.0 08/05/2014   TRIG 149.0 08/03/2013   TRIG 292.0* 07/28/2012   Lab Results  Component Value Date   CHOLHDL 4 08/05/2014   CHOLHDL 4 08/03/2013   CHOLHDL 5 07/28/2012   Lab Results  Component Value Date   LDLDIRECT 115.6 07/28/2012   LDLDIRECT 139.5 10/05/2010   LDLDIRECT 143.9 01/21/2010    Hypothyroid-no med Lab Results  Component Value Date   TSH 3.96 08/05/2014      Having L hip pain  "they said it was a pulled muscle"- at ER on April first - going on - pain in L buttock  Worse when standing and trying to walk   Patient Active Problem List   Diagnosis Date Noted   . Left hip pain 08/12/2014  . Hearing loss due to cerumen impaction 06/12/2014  . Hearing loss in right ear 06/12/2014  . Colon cancer screening 08/10/2013  . Seborrheic keratoses, inflamed 08/10/2013  . Excessive cerumen in left ear canal 08/10/2013  . History of fall 04/10/2013  . Encounter for Medicare annual wellness exam 08/08/2012  . Gout 07/14/2012  . Endometrial cancer 05/25/2012  . Degenerative joint disease of cervical spine 05/03/2011  . Other screening mammogram 03/31/2011  . Hyperglycemia 09/26/2007  . Obesity 07/12/2007  . GOITER, MULTINODULAR 07/11/2007  . Essential hypertension 07/11/2007  . CARDIOMYOPATHY 07/11/2007  . OSTEOARTHRITIS 07/11/2007  . 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 Hypothyroidism 10/05/2006  . HYPERCHOLESTEROLEMIA, PURE 10/05/2006   Past Medical History  Diagnosis Date  . Hypertension   . Hypothyroid   . Arthritis     OA  . Hyperlipidemia   . GERD (gastroesophageal reflux disease)   . Cancer     endometrial  . Kidney stone    Past Surgical History  Procedure Laterality Date  . Cholecystectomy    . Tubal ligation    . Knee arthroplasty  05/23/1998    total right  . Polyp removal  1999  . Hand surgery  12/2008    after hand fx/due to fall  . Joint replacement Bilateral 07/1998    knees  . Bone spur removed Right     shoulder  . Dilation and curettage of uterus  1999  . Eye surgery Bilateral     cataracts  . Laparotomy Bilateral 07/04/2012    Procedure: EXPLORATORY LAPAROTOMY TOTAL ABDOMINAL HYSTERECTOMY BILATERAL SALPINGO-OOPHORECTOMY with small bowel resection ;  Surgeon: Alvino Chapel, MD;  Location: [facility];  Service: Gynecology;  Laterality: Bilateral;  . Bowel resection  07/04/2012    Procedure: SMALL BOWEL RESECTION;  Surgeon: Alvino Chapel, MD;  Location: [facility];  Service: Gynecology;;   History  Substance Use Topics  . Smoking status: Never Smoker   . Smokeless tobacco: Never Used  .  Alcohol Use: No   Family History  Problem Relation Age of Onset  . Heart disease Sister     CABG  . Stroke Sister   . Heart disease Brother     CABG  . Cancer Brother     LEUKEMIA  . Cancer Brother     LUNG  . Cancer Brother     STOMACH   Allergies  Allergen Reactions  . Allopurinol Nausea Only  . Amoxicillin-Pot Clavulanate  REACTION: stomach upset  . Lipitor [Atorvastatin Calcium] Other (See Comments)    Leg ache and ache all over  . Simvastatin     REACTION: muscle pain  . Tape Itching and Rash    "Only the paper tape" --- also causes blisters.    Current Outpatient Prescriptions on File Prior to Visit  Medication Sig Dispense Refill  . ASPIRIN PO Take 2 tablets by mouth daily as needed (back and body aches).    . COLCRYS 0.6 MG tablet TAKE 1 TABLET (0.6 MG TOTAL) BY MOUTH 2 (TWO) TIMES DAILY. WITH FOOD AS NEEDED FOR GOUT 30 tablet 0  . cyanocobalamin 500 MCG tablet Take 1,000 mcg by mouth daily.     [name] HYDROcodone-acetaminophen (NORCO/VICODIN) 5-325 MG per tablet Take 1 tablet by mouth every 4 (four) hours as needed. 15 tablet 0   No current facility-administered medications on file prior to visit.     Review of Systems Review of Systems  Constitutional: Negative for fever, appetite change, and unexpected weight change. pos for fatigue  Eyes: Negative for pain and visual disturbance.  Respiratory: Negative for cough and shortness of breath.   Cardiovascular: Negative for cp or palpitations    Gastrointestinal: Negative for nausea, diarrhea and constipation.  Genitourinary: Negative for urgency and frequency.  Skin: Negative for pallor or rash   MSK pos for moderate to severe back pain and new L hip/groin pain  Neurological: Negative for weakness, light-headedness, numbness and headaches.  Hematological: Negative for adenopathy. Does not bruise/bleed easily.  Psychiatric/Behavioral: Negative for dysphoric mood. The patient is not nervous/anxious.          Objective:   Physical Exam  Constitutional: She appears well-developed and well-nourished. No distress.  obese and well appearing   HENT:  Head: Normocephalic and atraumatic.  Right Ear: External ear normal.  Left Ear: External ear normal.  Mouth/Throat: Oropharynx is clear and moist.  Hearing aide in R ear  Eyes: Conjunctivae and EOM are normal. Pupils are equal, round, and reactive to light. No scleral icterus.  Neck: Normal range of motion. Neck supple. No JVD present. Carotid bruit is not present. No thyromegaly present.  Cardiovascular: Normal rate, regular rhythm and intact distal pulses.  Exam reveals no gallop.   Murmur heard. Pulmonary/Chest: Effort normal and breath sounds normal. No respiratory distress. She has no wheezes. She exhibits no tenderness.  Abdominal: Soft. Bowel sounds are normal. She exhibits no distension, no abdominal bruit and no mass. There is no tenderness.  Musculoskeletal: Normal range of motion. She exhibits tenderness. She exhibits no edema.  Tender over L greater trochanter Pain on int rot of L hip   Pos slr L for leg and buttock pain   Lymphadenopathy:    She has no cervical adenopathy.  Neurological: She is alert. She has normal reflexes. No cranial nerve deficit. She exhibits normal muscle tone. Coordination normal.  Impaired gait due to foot/back and hip pain   Skin: Skin is warm and dry. No rash noted. No erythema. No pallor.  Psychiatric: She has a normal mood and affect.          Assessment & Plan:   Problem List Items Addressed This Visit      Cardiovascular and Mediastinum   Essential hypertension    bp in fair control at this time  BP Readings from Last 1 Encounters:  08/12/14 138/80   No changes needed Disc lifstyle change with low sodium diet and exercise  Need for wt loss  discussed       Relevant Medications   amLODipine (NORVASC) 5 MG tablet   doxazosin (CARDURA) 8 MG tablet   lisinopril (PRINIVIL,ZESTRIL) 5 MG  tablet   metoprolol (LOPRESSOR) 100 MG tablet     Endocrine   Hypothyroidism    Hypothyroidism  Pt has no clinical changes No change in energy level/ hair or skin/ edema and no tremor Lab Results  Component Value Date   TSH 3.96 08/05/2014          Relevant Medications   metoprolol (LOPRESSOR) 100 MG tablet     Other   Colon cancer screening    Given IFOB stool cards  No symptoms       Relevant Orders   Fecal occult blood, imunochemical   Encounter for Medicare annual wellness exam - Primary    Reviewed health habits including diet and exercise and skin cancer prevention Reviewed appropriate screening tests for age  Also reviewed health mt list, fam hx and immunization status , as well as social and family history   See HPI Labs reviewed Please do IFOB stool cards for colon cancer screening  Don't forget to follow up with your gyn doctor  prevnar vaccine today  If you are interested in a shingles/zoster vaccine - call your insurance to check on coverage,( you should not get it within 1 month of other vaccines) , then call us for a prescription  for it to take to a pharmacy that gives the shot , or make a nurse visit to get it here depending on your coverage      Relevant Orders   Pneumococcal conjugate vaccine 13-valent IM (Completed)   HYPERCHOLESTEROLEMIA, PURE    Fair control with diet Disc goals for lipids and reasons to control them Rev labs with pt Rev low sat fat diet in detail  F/u 3 mo      Relevant Medications   amLODipine (NORVASC) 5 MG tablet   doxazosin (CARDURA) 8 MG tablet   lisinopril (PRINIVIL,ZESTRIL) 5 MG tablet   metoprolol (LOPRESSOR) 100 MG tablet   Hyperglycemia    Lab Results  Component Value Date   HGBA1C 6.9* 08/05/2014   This is new - DM2 Pt is obese and has family hx  Disc imp of wt loss and low glycemic diet  F/u 3 mo after working on lifestyle change-handouts given Will disc ref to DM teaching at f/u       Left hip pain     Suspect this may be due to back and impaired gait  Poss OA xr today  She will also disc with her orthopedic doctor  Disc imp of wt loss for joint pain       Relevant Orders   DG HIP UNILAT WITH PELVIS 2-3 VIEWS LEFT (Completed)   Obesity    Pt needs to loose wt for multiple health problems and joint/back pain  Discussed how this problem influences overall health and the risks it imposes  Reviewed plan for weight loss with lower calorie diet (via better food choices and also portion control or program like weight watchers) and exercise building up to or more than 30 minutes 5 days per week including some aerobic activity          Other Visit Diagnoses    Need for vaccination with 13-polyvalent pneumococcal conjugate vaccine        Relevant Orders    Pneumococcal conjugate vaccine 13-valent IM (Completed)

## 2014-08-12 NOTE — Assessment & Plan Note (Signed)
Suspect this may be due to back and impaired gait  Poss OA xr today  She will also disc with her orthopedic doctor  Disc imp of wt loss for joint pain

## 2014-08-26 ENCOUNTER — Other Ambulatory Visit (INDEPENDENT_AMBULATORY_CARE_PROVIDER_SITE_OTHER): Payer: Medicare Other

## 2014-08-26 DIAGNOSIS — Z1211 Encounter for screening for malignant neoplasm of colon: Secondary | ICD-10-CM

## 2014-08-26 LAB — FECAL OCCULT BLOOD, GUAIAC: Fecal Occult Blood: NEGATIVE

## 2014-08-26 LAB — FECAL OCCULT BLOOD, IMMUNOCHEMICAL: Fecal Occult Bld: NEGATIVE

## 2014-08-27 ENCOUNTER — Encounter: Payer: Self-pay | Admitting: Family Medicine

## 2014-08-27 ENCOUNTER — Encounter: Payer: Self-pay | Admitting: *Deleted

## 2014-08-28 DIAGNOSIS — M7062 Trochanteric bursitis, left hip: Secondary | ICD-10-CM | POA: Diagnosis not present

## 2014-08-28 DIAGNOSIS — M25552 Pain in left hip: Secondary | ICD-10-CM | POA: Diagnosis not present

## 2014-09-19 ENCOUNTER — Ambulatory Visit (INDEPENDENT_AMBULATORY_CARE_PROVIDER_SITE_OTHER): Payer: Medicare Other | Admitting: Gynecology

## 2014-09-19 ENCOUNTER — Encounter: Payer: Self-pay | Admitting: Gynecology

## 2014-09-19 ENCOUNTER — Other Ambulatory Visit (HOSPITAL_COMMUNITY)
Admission: RE | Admit: 2014-09-19 | Discharge: 2014-09-19 | Disposition: A | Discharge status: Home / Self Care | Payer: Medicare Other | Source: Ambulatory Visit | Attending: Gynecology | Admitting: Gynecology

## 2014-09-19 VITALS — BP 130/80 | Ht 62.5 in | Wt 246.0 lb

## 2014-09-19 DIAGNOSIS — Z01411 Encounter for gynecological examination (general) (routine) with abnormal findings: Secondary | ICD-10-CM | POA: Insufficient documentation

## 2014-09-19 DIAGNOSIS — Z01419 Encounter for gynecological examination (general) (routine) without abnormal findings: Secondary | ICD-10-CM | POA: Diagnosis not present

## 2014-09-19 DIAGNOSIS — Z8542 Personal history of malignant neoplasm of other parts of uterus: Secondary | ICD-10-CM | POA: Diagnosis not present

## 2014-09-19 NOTE — Patient Instructions (Signed)
Shingles Vaccine What You Need to Know WHAT IS SHINGLES?  Shingles is a painful skin rash, often with blisters. It is also called Herpes Zoster or just Zoster.  A shingles rash usually appears on one side of the face or body and lasts from 2 to 4 weeks. Its main symptom is pain, which can be quite severe. Other symptoms of shingles can include fever, headache, chills, and upset stomach. Very rarely, a shingles infection can lead to pneumonia, hearing problems, blindness, brain inflammation (encephalitis), or death.  For about 1 person in 5, severe pain can continue even after the rash clears up. This is called post-herpetic neuralgia.  Shingles is caused by the Varicella Zoster virus. This is the same virus that causes chickenpox. Only someone who has had a case of chickenpox or rarely, has gotten chickenpox vaccine, can get shingles. The virus stays in your body. It can reappear many years later to cause a case of shingles.  You cannot catch shingles from another person with shingles. However, a person who has never had chickenpox (or chickenpox vaccine) could get chickenpox from someone with shingles. This is not very common.  Shingles is far more common in people 50 and older than in younger people. It is also more common in people whose immune systems are weakened because of a disease such as cancer or drugs such as steroids or chemotherapy.  At least 1 million people get shingles per year in the [place]. SHINGLES VACCINE  A vaccine for shingles was licensed in 2006. In clinical trials, the vaccine reduced the risk of shingles by 50%. It can also reduce the pain in people who still get shingles after being vaccinated.  A single dose of shingles vaccine is recommended for adults 60 years of age and older. SOME PEOPLE SHOULD NOT GET SHINGLES VACCINE OR SHOULD WAIT A person should not get shingles vaccine if he or she:  Has ever had a life-threatening allergic reaction to gelatin, the  antibiotic neomycin, or any other component of shingles vaccine. Tell your caregiver if you have any severe allergies.  Has a weakened immune system because of current:  AIDS or another disease that affects the immune system.  Treatment with drugs that affect the immune system, such as prolonged use of high-dose steroids.  Cancer treatment, such as radiation or chemotherapy.  Cancer affecting the bone marrow or lymphatic system, such as leukemia or lymphoma.  Is pregnant, or might be pregnant. Women should not become pregnant until at least 4 weeks after getting shingles vaccine. Someone with a minor illness, such as a cold, may be vaccinated. Anyone with a moderate or severe acute illness should usually wait until he or she recovers before getting the vaccine. This includes anyone with a temperature of 101.3 F (38 C) or higher. WHAT ARE THE RISKS FROM SHINGLES VACCINE?  A vaccine, like any medicine, could possibly cause serious problems, such as severe allergic reactions. However, the risk of a vaccine causing serious harm, or death, is extremely small.  No serious problems have been identified with shingles vaccine. Mild Problems  Redness, soreness, swelling, or itching at the site of the injection (about 1 person in 3).  Headache (about 1 person in 70). Like all vaccines, shingles vaccine is being closely monitored for unusual or severe problems. WHAT IF THERE IS A MODERATE OR SEVERE REACTION? What should I look for? Any unusual condition, such as a severe allergic reaction or a high fever. If a severe allergic reaction   occurred, it would be within a few minutes to an hour after the shot. Signs of a serious allergic reaction can include difficulty breathing, weakness, hoarseness or wheezing, a fast heartbeat, hives, dizziness, paleness, or swelling of the throat. What should I do?  Call your caregiver, or get the person to a caregiver right away.  Tell the caregiver what  happened, the date and time it happened, and when the vaccination was given.  Ask the caregiver to report the reaction by filing a Vaccine Adverse Event Reporting System (VAERS) form. Or, you can file this report through the VAERS web site at www.vaers.hhs.gov or by calling 1-800-822-7967. VAERS does not provide medical advice. HOW CAN I LEARN MORE?  Ask your caregiver. He or she can give you the vaccine package insert or suggest other sources of information.  Contact the Centers for Disease Control and Prevention (CDC):  Call 1-800-232-4636 (1-800-CDC-INFO).  Visit the CDC website at www.cdc.gov/vaccines CDC Shingles Vaccine VIS (01/23/08) Document Released: 01/31/2006 Document Revised: 06/28/2011 Document Reviewed: 07/26/2012 Exit[employer] Patient Information 2015 [employer]. This information is not intended to replace advice given to you by your health care provider. Make sure you discuss any questions you have with your health care provider.  

## 2014-09-19 NOTE — Progress Notes (Signed)
Valerie Ramirez [birth year] [id number]   History:    81 y.o.  for annual gyn exam with no complaints today.In 2014 patient had been seen in the office as a result of postmenopausal bleeding had an endometrial biopsy which demonstrated endometrial cancer and she was referred to the GYN oncologist Dr. Threasa Heads along 07/04/2012 performed a total abdominal hysterectomy with bilateral salpingo-oophorectomy and small bowel resection with re\re anastomoses. Her final pathology report demonstrated stage IA grade 1 endometrial carcinoma. The small bowel resection according to his notes had been performed as the result of severe adhesions. She had uncomplicated postoperative course. No further treatment was recommended. He last saw patient 02/09/2013.  Patient's primary physician is taking care of her multinodular goiter as well as hypercholesterolemia, hypertension and type 2 diabetes. Patient also has history of cardiomyopathy. Patient declines colonoscopy. Pap smear 2015 was normal. Patient is not received the shingles vaccine. She recently received her Pneumovax. Her PCP Dr. Alba Cory has been doing her blood work.   Past medical history,surgical history, family history and social history were all reviewed and documented in the EPIC chart.  Gynecologic History No LMP recorded. Patient is postmenopausal. Contraception: status post hysterectomy Last Pap: 2015. Results were: normal Last mammogram: 2015. Results were: normal  Obstetric History OB History  Gravida Para Term Preterm AB SAB TAB Ectopic Multiple Living  5 2   3 3    2     # Outcome Date GA Lbr Len/2nd Weight Sex Delivery Anes PTL Lv  5 SAB           4 SAB           3 SAB           2 Para           1 Para                ROS: A ROS was performed and pertinent positives and negatives are included in the history.  GENERAL: No fevers or chills. HEENT: No change in vision, no earache, sore throat or sinus congestion. NECK: No  pain or stiffness. CARDIOVASCULAR: No chest pain or pressure. No palpitations. PULMONARY: No shortness of breath, cough or wheeze. GASTROINTESTINAL: No abdominal pain, nausea, vomiting or diarrhea, melena or bright red blood per rectum. GENITOURINARY: No urinary frequency, urgency, hesitancy or dysuria. MUSCULOSKELETAL: No joint or muscle pain, no back pain, no recent trauma. DERMATOLOGIC: No rash, no itching, no lesions. ENDOCRINE: No polyuria, polydipsia, no heat or cold intolerance. No recent change in weight. HEMATOLOGICAL: No anemia or easy bruising or bleeding. NEUROLOGIC: No headache, seizures, numbness, tingling or weakness. PSYCHIATRIC: No depression, no loss of interest in normal activity or change in sleep pattern.     Exam: chaperone present  BP 130/80 mmHg  Ht 5' 2.5" (1.588 m)  Wt 246 lb (111.585 kg)  BMI 44.25 kg/m2  Body mass index is 44.25 kg/(m^2).  General appearance : Well developed well nourished female. No acute distress HEENT: Eyes: no retinal hemorrhage or exudates,  Neck supple, trachea midline, no carotid bruits, no thyroidmegaly Lungs: Clear to auscultation, no rhonchi or wheezes, or rib retractions  Heart: Regular rate and rhythm, no murmurs or gallops Breast:Examined in sitting and supine position were symmetrical in appearance, no palpable masses or tenderness,  no skin retraction, no nipple inversion, no nipple discharge, no skin discoloration, no axillary or supraclavicular lymphadenopathy Abdomen: no palpable masses or tenderness, no rebound or guarding Extremities: no edema or  skin discoloration or tenderness  Pelvic:  Bartholin, Urethra, Skene Glands: Within normal limits             Vagina: No gross lesions or discharge  Cervix: Absent  Uterus absent  Adnexa  Without masses or tenderness  Anus and perineum  normal   Rectovaginal  normal sphincter tone without palpated masses or tenderness             Hemoccult PCP provides    Assessment/plan:Patient  status post total abdominal hysterectomy with bilateral salpingo-oophorectomy and small bowel resection and reanastomosis in 2014 for stage IA grade 1 endometrial cancer doing well. No evidence of recurrent disease. Pap smear was done today. Bone density study last year was normal. We discussed importance of calcium vitamin D and regular exercise for osteoporosis prevention.    Terrance Mass MD, 2:58 PM 09/19/2014

## 2014-09-23 LAB — CYTOLOGY - PAP

## 2014-10-29 DIAGNOSIS — H4011X1 Primary open-angle glaucoma, mild stage: Secondary | ICD-10-CM | POA: Diagnosis not present

## 2014-10-29 DIAGNOSIS — H35363 Drusen (degenerative) of macula, bilateral: Secondary | ICD-10-CM | POA: Diagnosis not present

## 2014-11-05 ENCOUNTER — Telehealth: Payer: Self-pay | Admitting: Family Medicine

## 2014-11-05 DIAGNOSIS — E7800 Pure hypercholesterolemia, unspecified: Secondary | ICD-10-CM

## 2014-11-05 DIAGNOSIS — I10 Essential (primary) hypertension: Secondary | ICD-10-CM

## 2014-11-05 DIAGNOSIS — R739 Hyperglycemia, unspecified: Secondary | ICD-10-CM

## 2014-11-05 NOTE — Telephone Encounter (Signed)
-----   Message from Ellamae Sia sent at 10/30/2014 12:24 PM EDT ----- Regarding: Lab orders for Wednesday, 7.20.16 Lab orders for a 3 month f/u

## 2014-11-06 ENCOUNTER — Other Ambulatory Visit (INDEPENDENT_AMBULATORY_CARE_PROVIDER_SITE_OTHER): Payer: Medicare Other

## 2014-11-06 DIAGNOSIS — I10 Essential (primary) hypertension: Secondary | ICD-10-CM

## 2014-11-06 DIAGNOSIS — R739 Hyperglycemia, unspecified: Secondary | ICD-10-CM | POA: Diagnosis not present

## 2014-11-06 DIAGNOSIS — E7800 Pure hypercholesterolemia, unspecified: Secondary | ICD-10-CM

## 2014-11-06 LAB — COMPREHENSIVE METABOLIC PANEL
ALT: 9 U/L (ref 0–35)
AST: 12 U/L (ref 0–37)
Albumin: 3.7 g/dL (ref 3.5–5.2)
Alkaline Phosphatase: 104 U/L (ref 39–117)
BUN: 23 mg/dL (ref 6–23)
CO2: 28 mEq/L (ref 19–32)
Calcium: 9.1 mg/dL (ref 8.4–10.5)
Chloride: 105 mEq/L (ref 96–112)
Creatinine, Ser: 1.22 mg/dL — ABNORMAL HIGH (ref 0.40–1.20)
GFR: 45.05 mL/min — ABNORMAL LOW (ref >60.00)
Glucose, Bld: 128 mg/dL — ABNORMAL HIGH (ref 70–99)
Potassium: 4.8 mEq/L (ref 3.5–5.1)
Sodium: 140 mEq/L (ref 135–145)
Total Bilirubin: 0.3 mg/dL (ref 0.2–1.2)
Total Protein: 6.9 g/dL (ref 6.0–8.3)

## 2014-11-06 LAB — LIPID PANEL
Cholesterol: 188 mg/dL (ref 0–200)
HDL: 48 mg/dL (ref >39.00)
LDL Cholesterol: 117 mg/dL — ABNORMAL HIGH (ref 0–99)
NonHDL: 140
Total CHOL/HDL Ratio: 4
Triglycerides: 114 mg/dL (ref 0.0–149.0)
VLDL: 22.8 mg/dL (ref 0.0–40.0)

## 2014-11-06 LAB — HEMOGLOBIN A1C: Hgb A1c MFr Bld: 6.4 % (ref 4.6–6.5)

## 2014-11-11 ENCOUNTER — Ambulatory Visit (INDEPENDENT_AMBULATORY_CARE_PROVIDER_SITE_OTHER): Payer: Medicare Other | Admitting: Family Medicine

## 2014-11-11 ENCOUNTER — Encounter: Payer: Self-pay | Admitting: Family Medicine

## 2014-11-11 VITALS — BP 132/80 | HR 80 | Temp 97.8°F | Ht 62.5 in | Wt 247.8 lb

## 2014-11-11 DIAGNOSIS — E669 Obesity, unspecified: Secondary | ICD-10-CM

## 2014-11-11 DIAGNOSIS — I10 Essential (primary) hypertension: Secondary | ICD-10-CM

## 2014-11-11 DIAGNOSIS — R739 Hyperglycemia, unspecified: Secondary | ICD-10-CM | POA: Diagnosis not present

## 2014-11-11 NOTE — Progress Notes (Signed)
Subjective:    Patient ID: Valerie Ramirez, female    DOB: 28-Feb-1933, 81 y.o.   MRN: [id number]  HPI Here for f/u of hyperglycemia and HTN   Wt is up 1 lb with bmi of 44  bp is up slightly  today - re check better at 132/80 No cp or palpitations or headaches or edema  No side effects to medicines  BP Readings from Last 3 Encounters:  11/11/14 140/80  09/19/14 130/80  08/12/14 138/80      Chemistry      Component Value Date/Time   NA 140 11/06/2014 0822   K 4.8 11/06/2014 0822   CL 105 11/06/2014 0822   CO2 28 11/06/2014 0822   BUN 23 11/06/2014 0822   CREATININE 1.22* 11/06/2014 0822      Component Value Date/Time   CALCIUM 9.1 11/06/2014 0822   ALKPHOS 104 11/06/2014 0822   AST 12 11/06/2014 0822   ALT 9 11/06/2014 0822   BILITOT 0.3 11/06/2014 0822     stable   Hyperglycemia Lab Results  Component Value Date   HGBA1C 6.4 11/06/2014   This is down from 6.9 She quit eating a lot of sweets and starches (like potatoes)  This is difficult but sticking with it  No exercise "too hot"  Also "plain laze"-not motivated   Review of Systems Review of Systems  Constitutional: Negative for fever, appetite change, fatigue and unexpected weight change.  Eyes: Negative for pain and visual disturbance.  Respiratory: Negative for cough and shortness of breath.   Cardiovascular: Negative for cp or palpitations    Gastrointestinal: Negative for nausea, diarrhea and constipation.  Genitourinary: Negative for urgency and frequency.  Skin: Negative for pallor or rash   Neurological: Negative for weakness, light-headedness, numbness and headaches.  Hematological: Negative for adenopathy. Does not bruise/bleed easily.  Psychiatric/Behavioral: Negative for dysphoric mood. The patient is not nervous/anxious.         Objective:   Physical Exam  Constitutional: She appears well-developed and well-nourished. No distress.  Morbidly obese and well appearing   HENT:  Head:  Normocephalic and atraumatic.  Mouth/Throat: Oropharynx is clear and moist.  Eyes: Conjunctivae and EOM are normal. Pupils are equal, round, and reactive to light.  Neck: Normal range of motion. Neck supple. No JVD present. Carotid bruit is not present. No thyromegaly present.  Cardiovascular: Normal rate, regular rhythm, normal heart sounds and intact distal pulses.  Exam reveals no gallop.   Pulmonary/Chest: Effort normal and breath sounds normal. No respiratory distress. She has no wheezes. She has no rales.  No crackles  Abdominal: Soft. Bowel sounds are normal. She exhibits no distension, no abdominal bruit and no mass. There is no tenderness.  Musculoskeletal: She exhibits edema.  Non pitting lymphedema in ankles   Lymphadenopathy:    She has no cervical adenopathy.  Neurological: She is alert. She has normal reflexes.  Skin: Skin is warm and dry. No rash noted.  Some skin tags around eyes bilat   Psychiatric: She has a normal mood and affect.          Assessment & Plan:   Problem List Items Addressed This Visit    Essential hypertension    bp in fair control at this time   (better on 2nd check)  BP Readings from Last 1 Encounters:  11/11/14 132/80   No changes needed Disc lifstyle change with low sodium diet and exercise  (pt is not motivated to exercise)  Relevant Orders   Basic metabolic panel   Hyperglycemia - Primary    Improved Lab Results  Component Value Date   HGBA1C 6.4 11/06/2014   Will continue to watch  Enc to continue lower glycemic diet  Also wt loss enc (pt is not motivated)  Explained imp of this to prevent DM2 F/u 50mo       Relevant Orders   Hemoglobin U9W   Basic metabolic panel   Obesity    Discussed how this problem influences overall health and the risks it imposes  Reviewed plan for weight loss with lower calorie diet (via better food choices and also portion control or program like weight watchers) and exercise building up to or  more than 30 minutes 5 days per week including some aerobic activity   Pt is not motivated to exercise but cutting carbs for hyperglycemia at this time

## 2014-11-11 NOTE — Assessment & Plan Note (Signed)
Improved Lab Results  Component Value Date   HGBA1C 6.4 11/06/2014   Will continue to watch  Enc to continue lower glycemic diet  Also wt loss enc (pt is not motivated)  Explained imp of this to prevent DM2 F/u 51mo

## 2014-11-11 NOTE — Assessment & Plan Note (Signed)
bp in fair control at this time   (better on 2nd check)  BP Readings from Last 1 Encounters:  11/11/14 132/80   No changes needed Disc lifstyle change with low sodium diet and exercise  (pt is not motivated to exercise)

## 2014-11-11 NOTE — Patient Instructions (Signed)
Blood sugar is improved - in the prediabetic range now  Stay away from sweets and starches as much as you can When motivated- try to move more  Work on weight loss   Follow up in about 6 months

## 2014-11-11 NOTE — Assessment & Plan Note (Signed)
Discussed how this problem influences overall health and the risks it imposes  Reviewed plan for weight loss with lower calorie diet (via better food choices and also portion control or program like weight watchers) and exercise building up to or more than 30 minutes 5 days per week including some aerobic activity   Pt is not motivated to exercise but cutting carbs for hyperglycemia at this time

## 2014-11-13 DIAGNOSIS — H3531 Nonexudative age-related macular degeneration: Secondary | ICD-10-CM | POA: Diagnosis not present

## 2014-11-13 DIAGNOSIS — H35371 Puckering of macula, right eye: Secondary | ICD-10-CM | POA: Diagnosis not present

## 2014-11-13 DIAGNOSIS — H43813 Vitreous degeneration, bilateral: Secondary | ICD-10-CM | POA: Diagnosis not present

## 2014-11-14 ENCOUNTER — Other Ambulatory Visit: Payer: Self-pay | Admitting: Family Medicine

## 2014-11-14 NOTE — Telephone Encounter (Signed)
Pt had f/u on 11/11/14 and has another f/u scheduled on 04/2715, last refilled on 08/12/14 #90 with 0 refills, please advise

## 2014-11-14 NOTE — Telephone Encounter (Signed)
done

## 2014-11-14 NOTE — Telephone Encounter (Signed)
Please refill for a year  

## 2014-11-19 DIAGNOSIS — S8000XA Contusion of unspecified knee, initial encounter: Secondary | ICD-10-CM | POA: Diagnosis not present

## 2014-11-25 ENCOUNTER — Encounter: Payer: Self-pay | Admitting: Family Medicine

## 2014-11-25 ENCOUNTER — Ambulatory Visit (INDEPENDENT_AMBULATORY_CARE_PROVIDER_SITE_OTHER): Payer: Medicare Other | Admitting: Family Medicine

## 2014-11-25 VITALS — BP 146/72 | HR 57 | Temp 97.9°F | Wt 246.5 lb

## 2014-11-25 DIAGNOSIS — S80812A Abrasion, left lower leg, initial encounter: Secondary | ICD-10-CM | POA: Diagnosis not present

## 2014-11-25 DIAGNOSIS — S80819A Abrasion, unspecified lower leg, initial encounter: Secondary | ICD-10-CM | POA: Insufficient documentation

## 2014-11-25 DIAGNOSIS — S8011XA Contusion of right lower leg, initial encounter: Secondary | ICD-10-CM

## 2014-11-25 DIAGNOSIS — S8010XA Contusion of unspecified lower leg, initial encounter: Secondary | ICD-10-CM | POA: Insufficient documentation

## 2014-11-25 MED ORDER — CEPHALEXIN 500 MG PO CAPS: 500.0000 mg | ORAL_CAPSULE | Freq: Three times a day (TID) | ORAL | Status: DC

## 2014-11-25 NOTE — Progress Notes (Signed)
Pre visit review using our clinic review tool, if applicable. No additional management support is needed unless otherwise documented below in the visit note. 

## 2014-11-25 NOTE — Assessment & Plan Note (Signed)
After a fall in flower bed -in pt on [place]  Large hematoma - slowly resolving (with nl xr in ortho office)  Explained that this will resolve very slowly No s/s of compartment syndrome  Will udpate if sudden inc in pain   Disc fall prevention and handouts given as well

## 2014-11-25 NOTE — Patient Instructions (Signed)
When able - elevate your right leg and use a cold compress -for swelling and discomfort -this will slowly improve (hematoma) For the abrasion on your left leg-take the keflex for infection and continue to clean with soap and water (not peroxide), and use antibiotic ointment and cover lightly  If increased redness or streaks develop- please let me know   Have something to hold on to when you are on uneven ground - and be careful not to fall

## 2014-11-25 NOTE — Progress Notes (Signed)
Subjective:    Patient ID: Valerie Ramirez, female    DOB: 1932-10-11, 81 y.o.   MRN: [id number]  HPI Here for f/u after a fall   Last Tuesday she was cleaning out the flowerbed - stubbed toe and then lost balance and fell  She usually has a garden tool to hold on too- forgot  [place] [address] forward onto knees - part brick and part dirt   Saw ortho the next day- had an xray and nothing was broken   Still pretty sore  Quite bruised and somewhat swelled (bump over the bruise)  Abrasion on L leg - she has it cleaned and covered  Is on aspirin   utd Td  Patient Active Problem List   Diagnosis Date Noted  . Left hip pain 08/12/2014  . Osteoarthritis, hip, bilateral 08/12/2014  . Hearing loss due to cerumen impaction 06/12/2014  . Hearing loss in right ear 06/12/2014  . Colon cancer screening 08/10/2013  . Seborrheic keratoses, inflamed 08/10/2013  . Excessive cerumen in left ear canal 08/10/2013  . History of fall 04/10/2013  . Encounter for Medicare annual wellness exam 08/08/2012  . Gout 07/14/2012  . Endometrial cancer 05/25/2012  . Degenerative joint disease of cervical spine 05/03/2011  . Other screening mammogram 03/31/2011  . Hyperglycemia 09/26/2007  . Obesity 07/12/2007  . GOITER, MULTINODULAR 07/11/2007  . Essential hypertension 07/11/2007  . CARDIOMYOPATHY 07/11/2007  . OSTEOARTHRITIS 07/11/2007  . 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 Hypothyroidism 10/05/2006  . HYPERCHOLESTEROLEMIA, PURE 10/05/2006   Past Medical History  Diagnosis Date  . Hypertension   . Hypothyroid   . Arthritis     OA  . Hyperlipidemia   . GERD (gastroesophageal reflux disease)   . Cancer     endometrial  . Kidney stone    Past Surgical History  Procedure Laterality Date  . Cholecystectomy    . Tubal ligation    . Knee arthroplasty  05/23/1998    total right  . Polyp removal  1999  . Hand surgery  12/2008    after hand fx/due to fall  . Joint replacement Bilateral  07/1998    knees  . Bone spur removed Right     shoulder  . Dilation and curettage of uterus  1999  . Eye surgery Bilateral     cataracts  . Laparotomy Bilateral 07/04/2012    Procedure: EXPLORATORY LAPAROTOMY TOTAL ABDOMINAL HYSTERECTOMY BILATERAL SALPINGO-OOPHORECTOMY with small bowel resection ;  Surgeon: Alvino Chapel, MD;  Location: [facility];  Service: Gynecology;  Laterality: Bilateral;  . Bowel resection  07/04/2012    Procedure: SMALL BOWEL RESECTION;  Surgeon: Alvino Chapel, MD;  Location: [facility];  Service: Gynecology;;   History  Substance Use Topics  . Smoking status: Never Smoker   . Smokeless tobacco: Never Used  . Alcohol Use: No   Family History  Problem Relation Age of Onset  . Heart disease Sister     CABG  . Stroke Sister   . Heart disease Brother     CABG  . Cancer Brother     LEUKEMIA  . Cancer Brother     LUNG  . Cancer Brother     STOMACH   Allergies  Allergen Reactions  . Allopurinol Nausea Only  . Amoxicillin-Pot Clavulanate     REACTION: stomach upset  . Lipitor [Atorvastatin Calcium] Other (See Comments)    Leg ache and ache all over  . Simvastatin     REACTION:  muscle pain  . Tape Itching and Rash    "Only the paper tape" --- also causes blisters.    Current Outpatient Prescriptions on File Prior to Visit  Medication Sig Dispense Refill  . amLODipine (NORVASC) 5 MG tablet Take 1 tablet (5 mg total) by mouth daily. 90 tablet 3  . ASPIRIN PO Take 2 tablets by mouth daily as needed (back and body aches).    . COLCRYS 0.6 MG tablet TAKE 1 TABLET (0.6 MG TOTAL) BY MOUTH 2 (TWO) TIMES DAILY. WITH FOOD AS NEEDED FOR GOUT 30 tablet 0  . cyanocobalamin 500 MCG tablet Take 1,000 mcg by mouth daily.     [name] doxazosin (CARDURA) 8 MG tablet TAKE 1 TABLET (8 MG TOTAL) BY MOUTH AT BEDTIME. 90 tablet 3  . DULoxetine (CYMBALTA) 20 MG capsule TAKE 1 CAPSULE (20 MG TOTAL) BY MOUTH DAILY. 90 capsule 3  . HYDROcodone-acetaminophen  (NORCO/VICODIN) 5-325 MG per tablet Take 1 tablet by mouth every 4 (four) hours as needed. 15 tablet 0  . lisinopril (PRINIVIL,ZESTRIL) 5 MG tablet Take 1 tablet (5 mg total) by mouth daily. 90 tablet 3  . metoprolol (LOPRESSOR) 100 MG tablet TAKE 1 TABLET (100 MG TOTAL) BY MOUTH 2 (TWO) TIMES DAILY. 180 tablet 3  . Specialty Vitamins Products (ECHINACEA C COMPLETE PO) Take by mouth daily.     No current facility-administered medications on file prior to visit.     Review of Systems Review of Systems  Constitutional: Negative for fever, appetite change, fatigue and unexpected weight change.  Eyes: Negative for pain and visual disturbance.  Respiratory: Negative for cough and shortness of breath.   Cardiovascular: Negative for cp or palpitations    Gastrointestinal: Negative for nausea, diarrhea and constipation.  Genitourinary: Negative for urgency and frequency.  Skin: Negative for pallor or rash  pos for wound on L leg  MSK pos for R knee pain and swelling  Neurological: Negative for weakness, light-headedness, numbness and headaches.  Hematological: Negative for adenopathy. Does not bruise/bleed easily.  Psychiatric/Behavioral: Negative for dysphoric mood. The patient is not nervous/anxious.         Objective:   Physical Exam  Constitutional: She appears well-developed and well-nourished. No distress.  obese and well appearing   HENT:  Head: Normocephalic and atraumatic.  Eyes: Conjunctivae are normal. Pupils are equal, round, and reactive to light.  Neck: Normal range of motion. Neck supple.  Cardiovascular: Normal rate and regular rhythm.   Musculoskeletal: She exhibits edema and tenderness.  R knee - suprapatellar hematoma with ecchymosis and swelling -tender to the touch No joint line tenderness Nl rom  L leg - skin abrasion with healing yellow granulation tissue and thin collar of erythema and swelling   Lymphadenopathy:    She has no cervical adenopathy.    Neurological: She is alert. She has normal reflexes. She exhibits normal muscle tone.  Skin: Skin is warm and dry. No rash noted. No erythema. No pallor.  Psychiatric: She has a normal mood and affect.          Assessment & Plan:   Problem List Items Addressed This Visit    Abrasion, lower leg, anterior    Disc fall prevention  Some erythema - inst to clean with soap and water/use abx oint Px keflex- 7 d tid  Update if inc redness or drainage       Traumatic hematoma of lower leg - Primary    After a fall in flower bed -in pt  on [place]  Large hematoma - slowly resolving (with nl xr in ortho office)  Explained that this will resolve very slowly No s/s of compartment syndrome  Will udpate if sudden inc in pain   Disc fall prevention and handouts given as well

## 2014-11-25 NOTE — Assessment & Plan Note (Signed)
Disc fall prevention  Some erythema - inst to clean with soap and water/use abx oint Px keflex- 7 d tid  Update if inc redness or drainage

## 2014-12-30 DIAGNOSIS — H35371 Puckering of macula, right eye: Secondary | ICD-10-CM | POA: Diagnosis not present

## 2015-01-07 DIAGNOSIS — H35371 Puckering of macula, right eye: Secondary | ICD-10-CM | POA: Diagnosis not present

## 2015-01-29 DIAGNOSIS — H35371 Puckering of macula, right eye: Secondary | ICD-10-CM | POA: Diagnosis not present

## 2015-01-29 DIAGNOSIS — H353132 Nonexudative age-related macular degeneration, bilateral, intermediate dry stage: Secondary | ICD-10-CM | POA: Diagnosis not present

## 2015-01-29 DIAGNOSIS — H3581 Retinal edema: Secondary | ICD-10-CM | POA: Diagnosis not present

## 2015-05-09 ENCOUNTER — Other Ambulatory Visit (INDEPENDENT_AMBULATORY_CARE_PROVIDER_SITE_OTHER): Payer: Medicare Other

## 2015-05-09 DIAGNOSIS — I10 Essential (primary) hypertension: Secondary | ICD-10-CM | POA: Diagnosis not present

## 2015-05-09 DIAGNOSIS — R739 Hyperglycemia, unspecified: Secondary | ICD-10-CM | POA: Diagnosis not present

## 2015-05-09 LAB — BASIC METABOLIC PANEL
BUN: 27 mg/dL — ABNORMAL HIGH (ref 6–23)
CO2: 24 mEq/L (ref 19–32)
Calcium: 9.2 mg/dL (ref 8.4–10.5)
Chloride: 104 mEq/L (ref 96–112)
Creatinine, Ser: 1.38 mg/dL — ABNORMAL HIGH (ref 0.40–1.20)
GFR: 39.03 mL/min — ABNORMAL LOW (ref >60.00)
Glucose, Bld: 142 mg/dL — ABNORMAL HIGH (ref 70–99)
Potassium: 4.4 mEq/L (ref 3.5–5.1)
Sodium: 139 mEq/L (ref 135–145)

## 2015-05-09 LAB — HEMOGLOBIN A1C: Hgb A1c MFr Bld: 6.8 % — ABNORMAL HIGH (ref 4.6–6.5)

## 2015-05-16 ENCOUNTER — Ambulatory Visit (INDEPENDENT_AMBULATORY_CARE_PROVIDER_SITE_OTHER): Payer: Medicare Other | Admitting: Family Medicine

## 2015-05-16 ENCOUNTER — Encounter: Payer: Self-pay | Admitting: Family Medicine

## 2015-05-16 VITALS — BP 130/80 | HR 63 | Temp 97.9°F

## 2015-05-16 DIAGNOSIS — I10 Essential (primary) hypertension: Secondary | ICD-10-CM

## 2015-05-16 DIAGNOSIS — Z23 Encounter for immunization: Secondary | ICD-10-CM | POA: Diagnosis not present

## 2015-05-16 DIAGNOSIS — R739 Hyperglycemia, unspecified: Secondary | ICD-10-CM | POA: Diagnosis not present

## 2015-05-16 DIAGNOSIS — M79603 Pain in arm, unspecified: Secondary | ICD-10-CM

## 2015-05-16 DIAGNOSIS — M25511 Pain in right shoulder: Secondary | ICD-10-CM | POA: Insufficient documentation

## 2015-05-16 DIAGNOSIS — E669 Obesity, unspecified: Secondary | ICD-10-CM

## 2015-05-16 LAB — SEDIMENTATION RATE: Sed Rate: 36 mm/hr — ABNORMAL HIGH (ref 0–22)

## 2015-05-16 NOTE — Progress Notes (Signed)
Pre visit review using our clinic review tool, if applicable. No additional management support is needed unless otherwise documented below in the visit note. 

## 2015-05-16 NOTE — Patient Instructions (Signed)
Lab today (sed rate) for the pain you are having If it is negative we will have you follow up with Dr Lorin Mercy Blood sugar is up - please cut out sweets/work on weight loss Cut carbohydrates (starches)- bread/potato/pasta/rice  Flu shot today Keep drinking water - your kidney number is off slightly   Follow up in 3 months with labs prior  Make sure you take your regular am medicine before you come

## 2015-05-16 NOTE — Progress Notes (Signed)
Subjective:    Patient ID: Valerie Ramirez, female    DOB: 07-Aug-1932, 82 y.o.   MRN: [id number]  HPI Here for f/u of chronic health problems  Has pain all over across her shoulders  Going on about 2 weeks  ? What caused it  Neck and upper back too  Pain can radiate all the way down to her fingers  Sometimes it is "really bad"  Movement does not make a different  Knows she has neck spurs -sees Dr Gretchen Portela been cracking more   Flu shot today   bp is stable today  No cp or palpitations or headaches or edema  No side effects to medicines  BP Readings from Last 3 Encounters:  05/16/15 142/86  11/25/14 146/72  11/11/14 132/80      Results for orders placed or performed in visit on 05/09/15  Hemoglobin A1c  Result Value Ref Range   Hgb A1c MFr Bld 6.8 (H) 4.6 - 6.5 %  Basic metabolic panel  Result Value Ref Range   Sodium 139 135 - 145 mEq/L   Potassium 4.4 3.5 - 5.1 mEq/L   Chloride 104 96 - 112 mEq/L   CO2 24 19 - 32 mEq/L   Glucose, Bld 142 (H) 70 - 99 mg/dL   BUN 27 (H) 6 - 23 mg/dL   Creatinine, Ser 1.38 (H) 0.40 - 1.20 mg/dL   Calcium 9.2 8.4 - 10.5 mg/dL   GFR 39.03 (L) >60.00 mL/min     Hyperglycemia  Up from 6.4  She is eating too many sweets  She thinks she can leave off the sweets  Is obese  Does not eat a lot of bread  More pasta than she needs - can cut that  Eats a fair amt of potatoes  Cannot walk for exercise - gets too out of breath   Cr is up  Drinks water "all the time" Thinks she drinks 16 oz 5-6 per day  Stays thirsty- likely due to high blood sugar   Feet- are numb at times   Patient Active Problem List   Diagnosis Date Noted  . Arm pain 05/16/2015  . Traumatic hematoma of lower leg 11/25/2014  . Abrasion, lower leg, anterior 11/25/2014  . Left hip pain 08/12/2014  . Osteoarthritis, hip, bilateral 08/12/2014  . Hearing loss due to cerumen impaction 06/12/2014  . Hearing loss in right ear 06/12/2014  . Colon cancer screening  08/10/2013  . Seborrheic keratoses, inflamed 08/10/2013  . Excessive cerumen in left ear canal 08/10/2013  . History of fall 04/10/2013  . Encounter for Medicare annual wellness exam 08/08/2012  . Gout 07/14/2012  . Endometrial cancer ([place]) 05/25/2012  . Degenerative joint disease of cervical spine 05/03/2011  . Other screening mammogram 03/31/2011  . Hyperglycemia 09/26/2007  . Obesity 07/12/2007  . GOITER, MULTINODULAR 07/11/2007  . Essential hypertension 07/11/2007  . CARDIOMYOPATHY 07/11/2007  . OSTEOARTHRITIS 07/11/2007  . 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 Hypothyroidism 10/05/2006  . HYPERCHOLESTEROLEMIA, PURE 10/05/2006   Past Medical History  Diagnosis Date  . Hypertension   . Hypothyroid   . Arthritis     OA  . Hyperlipidemia   . GERD (gastroesophageal reflux disease)   . Cancer [facility])     endometrial  . Kidney stone    Past Surgical History  Procedure Laterality Date  . Cholecystectomy    . Tubal ligation    . Knee arthroplasty  05/23/1998    total right  .  Polyp removal  1999  . Hand surgery  12/2008    after hand fx/due to fall  . Joint replacement Bilateral 07/1998    knees  . Bone spur removed Right     shoulder  . Dilation and curettage of uterus  1999  . Eye surgery Bilateral     cataracts  . Laparotomy Bilateral 07/04/2012    Procedure: EXPLORATORY LAPAROTOMY TOTAL ABDOMINAL HYSTERECTOMY BILATERAL SALPINGO-OOPHORECTOMY with small bowel resection ;  Surgeon: Alvino Chapel, MD;  Location: [facility];  Service: Gynecology;  Laterality: Bilateral;  . Bowel resection  07/04/2012    Procedure: SMALL BOWEL RESECTION;  Surgeon: Alvino Chapel, MD;  Location: [facility];  Service: Gynecology;;   Social History  Substance Use Topics  . Smoking status: Never Smoker   . Smokeless tobacco: Never Used  . Alcohol Use: No   Family History  Problem Relation Age of Onset  . Heart disease Sister     CABG  . Stroke Sister   . Heart  disease Brother     CABG  . Cancer Brother     LEUKEMIA  . Cancer Brother     LUNG  . Cancer Brother     STOMACH   Allergies  Allergen Reactions  . Allopurinol Nausea Only  . Amoxicillin-Pot Clavulanate     REACTION: stomach upset  . Lipitor [Atorvastatin Calcium] Other (See Comments)    Leg ache and ache all over  . Simvastatin     REACTION: muscle pain  . Tape Itching and Rash    "Only the paper tape" --- also causes blisters.    Current Outpatient Prescriptions on File Prior to Visit  Medication Sig Dispense Refill  . amLODipine (NORVASC) 5 MG tablet Take 1 tablet (5 mg total) by mouth daily. 90 tablet 3  . ASPIRIN PO Take 2 tablets by mouth daily as needed (back and body aches).    . COLCRYS 0.6 MG tablet TAKE 1 TABLET (0.6 MG TOTAL) BY MOUTH 2 (TWO) TIMES DAILY. WITH FOOD AS NEEDED FOR GOUT 30 tablet 0  . cyanocobalamin 500 MCG tablet Take 1,000 mcg by mouth daily.     [name] doxazosin (CARDURA) 8 MG tablet TAKE 1 TABLET (8 MG TOTAL) BY MOUTH AT BEDTIME. 90 tablet 3  . DULoxetine (CYMBALTA) 20 MG capsule TAKE 1 CAPSULE (20 MG TOTAL) BY MOUTH DAILY. 90 capsule 3  . lisinopril (PRINIVIL,ZESTRIL) 5 MG tablet Take 1 tablet (5 mg total) by mouth daily. 90 tablet 3  . metoprolol (LOPRESSOR) 100 MG tablet TAKE 1 TABLET (100 MG TOTAL) BY MOUTH 2 (TWO) TIMES DAILY. 180 tablet 3  . Specialty Vitamins Products (ECHINACEA C COMPLETE PO) Take by mouth daily.     No current facility-administered medications on file prior to visit.     Review of Systems Review of Systems  Constitutional: Negative for fever, appetite change, fatigue and unexpected weight change.  Eyes: Negative for pain and visual disturbance.  Respiratory: Negative for cough and shortness of breath.   Cardiovascular: Negative for cp or palpitations    Gastrointestinal: Negative for nausea, diarrhea and constipation.  Genitourinary: Negative for urgency and frequency. pos for thirst Skin: Negative for pallor or rash     MSK pos for chronic back pain , pos for recent inc shoulder girdle pain  Neurological: Negative for weakness, light-headedness,  and headaches.  Hematological: Negative for adenopathy. Does not bruise/bleed easily.  Psychiatric/Behavioral: Negative for dysphoric mood. The patient is not nervous/anxious.  Objective:   Physical Exam  Constitutional: She appears well-developed and well-nourished. No distress.  obese and well appearing   HENT:  Head: Normocephalic and atraumatic.  Mouth/Throat: Oropharynx is clear and moist.  Eyes: Conjunctivae and EOM are normal. Pupils are equal, round, and reactive to light.  Neck: Normal range of motion. Neck supple. No JVD present. Carotid bruit is not present. No thyromegaly present.  Cardiovascular: Normal rate, regular rhythm, normal heart sounds and intact distal pulses.  Exam reveals no gallop.   Pulmonary/Chest: Effort normal and breath sounds normal. No respiratory distress. She has no wheezes. She has no rales.  No crackles  Abdominal: Soft. Bowel sounds are normal. She exhibits no distension, no abdominal bruit and no mass. There is no tenderness.  Musculoskeletal: She exhibits tenderness. She exhibits no edema.  Tender points along cervical musculature (nl rom but pain to do so), trapezius, upper and lower arms   LS tenderness and limited rom  No joint deformity or effusion noted  No joint redness or swelling   Lymphadenopathy:    She has no cervical adenopathy.  Neurological: She is alert. She has normal reflexes. No cranial nerve deficit. She exhibits normal muscle tone. Coordination normal.  Skin: Skin is warm and dry. No rash noted. No pallor.  Psychiatric: She has a normal mood and affect.          Assessment & Plan:   Problem List Items Addressed This Visit      Cardiovascular and Mediastinum   Essential hypertension    bp in fair control at this time  BP Readings from Last 1 Encounters:  05/16/15 130/80   No  changes needed Disc lifstyle change with low sodium diet and exercise  Labs reviewed  Re check 3 mo        Other   Arm pain    Generalized upper body/shoulder girdle pain  Not on statin  Check ESR for poss PMR If neg -will recommend f/u with ortho (Dr Lorin Mercy)       Relevant Orders   Sedimentation Rate (Completed)   Hyperglycemia    Worsened Lab Results  Component Value Date   HGBA1C 6.8* 05/09/2015   Disc need to get serious about low glycemic diet and wt loss Pt is eating a lot of carbs and sweets Outlined a plan for this Lab and f/u 3 mo  Disc med if not improved       Obesity    Discussed how this problem influences overall health and the risks it imposes  Reviewed plan for weight loss with lower calorie diet (via better food choices and also portion control or program like weight watchers) and exercise building up to or more than 30 minutes 5 days per week including some aerobic activity   Pt is not open to exercise with her acute on chronic pain  Disc imp of wt loss for DM       Other Visit Diagnoses    Need for influenza vaccination    -  Primary    Relevant Orders    Flu Vaccine QUAD 36+ mos PF IM (Fluarix & Fluzone Quad PF) (Completed)

## 2015-05-18 NOTE — Assessment & Plan Note (Signed)
Generalized upper body/shoulder girdle pain  Not on statin  Check ESR for poss PMR If neg -will recommend f/u with ortho (Dr Lorin Mercy)

## 2015-05-18 NOTE — Assessment & Plan Note (Signed)
Worsened Lab Results  Component Value Date   HGBA1C 6.8* 05/09/2015   Disc need to get serious about low glycemic diet and wt loss Pt is eating a lot of carbs and sweets Outlined a plan for this Lab and f/u 3 mo  Disc med if not improved

## 2015-05-18 NOTE — Assessment & Plan Note (Signed)
bp in fair control at this time  BP Readings from Last 1 Encounters:  05/16/15 130/80   No changes needed Disc lifstyle change with low sodium diet and exercise  Labs reviewed  Re check 3 mo

## 2015-05-18 NOTE — Assessment & Plan Note (Signed)
Discussed how this problem influences overall health and the risks it imposes  Reviewed plan for weight loss with lower calorie diet (via better food choices and also portion control or program like weight watchers) and exercise building up to or more than 30 minutes 5 days per week including some aerobic activity   Pt is not open to exercise with her acute on chronic pain  Disc imp of wt loss for DM

## 2015-05-29 ENCOUNTER — Other Ambulatory Visit: Payer: Self-pay | Admitting: Family Medicine

## 2015-05-30 NOTE — Telephone Encounter (Signed)
Please refill times 3 

## 2015-05-30 NOTE — Telephone Encounter (Signed)
done

## 2015-05-30 NOTE — Telephone Encounter (Signed)
Pt has CPE scheduled on 08/10/15, last refilled on 05/16/15 #30 with 0 refills, please advise

## 2015-06-20 DIAGNOSIS — M4722 Other spondylosis with radiculopathy, cervical region: Secondary | ICD-10-CM | POA: Diagnosis not present

## 2015-06-26 ENCOUNTER — Other Ambulatory Visit: Payer: Self-pay | Admitting: Family Medicine

## 2015-07-18 DIAGNOSIS — M4722 Other spondylosis with radiculopathy, cervical region: Secondary | ICD-10-CM | POA: Diagnosis not present

## 2015-07-21 ENCOUNTER — Other Ambulatory Visit: Payer: Self-pay | Admitting: Orthopaedic Surgery

## 2015-07-21 DIAGNOSIS — M542 Cervicalgia: Secondary | ICD-10-CM

## 2015-07-22 ENCOUNTER — Ambulatory Visit
Admission: RE | Admit: 2015-07-22 | Discharge: 2015-07-22 | Disposition: A | Discharge status: Home / Self Care | Payer: Medicare Other | Source: Ambulatory Visit | Attending: Orthopaedic Surgery | Admitting: Orthopaedic Surgery

## 2015-07-22 DIAGNOSIS — M542 Cervicalgia: Secondary | ICD-10-CM

## 2015-07-22 DIAGNOSIS — M50221 Other cervical disc displacement at C4-C5 level: Secondary | ICD-10-CM | POA: Diagnosis not present

## 2015-07-28 DIAGNOSIS — M542 Cervicalgia: Secondary | ICD-10-CM | POA: Diagnosis not present

## 2015-08-08 ENCOUNTER — Encounter (HOSPITAL_COMMUNITY): Payer: Self-pay

## 2015-08-08 ENCOUNTER — Ambulatory Visit (HOSPITAL_COMMUNITY)
Admission: RE | Admit: 2015-08-08 | Discharge: 2015-08-08 | Disposition: A | Discharge status: Home / Self Care | Payer: Medicare Other | Source: Ambulatory Visit | Attending: Surgery | Admitting: Surgery

## 2015-08-08 ENCOUNTER — Encounter (HOSPITAL_COMMUNITY)
Admission: RE | Admit: 2015-08-08 | Discharge: 2015-08-08 | Disposition: A | Discharge status: Home / Self Care | Payer: Medicare Other | Source: Ambulatory Visit | Attending: Orthopaedic Surgery | Admitting: Orthopaedic Surgery

## 2015-08-08 ENCOUNTER — Other Ambulatory Visit: Payer: Medicare Other

## 2015-08-08 DIAGNOSIS — I517 Cardiomegaly: Secondary | ICD-10-CM | POA: Diagnosis not present

## 2015-08-08 DIAGNOSIS — R9431 Abnormal electrocardiogram [ECG] [EKG]: Secondary | ICD-10-CM | POA: Insufficient documentation

## 2015-08-08 DIAGNOSIS — M4322 Fusion of spine, cervical region: Secondary | ICD-10-CM | POA: Diagnosis not present

## 2015-08-08 DIAGNOSIS — Z01818 Encounter for other preprocedural examination: Secondary | ICD-10-CM

## 2015-08-08 DIAGNOSIS — I498 Other specified cardiac arrhythmias: Secondary | ICD-10-CM | POA: Insufficient documentation

## 2015-08-08 DIAGNOSIS — E7800 Pure hypercholesterolemia, unspecified: Secondary | ICD-10-CM | POA: Insufficient documentation

## 2015-08-08 DIAGNOSIS — Z01812 Encounter for preprocedural laboratory examination: Secondary | ICD-10-CM | POA: Insufficient documentation

## 2015-08-08 DIAGNOSIS — Z01810 Encounter for preprocedural cardiovascular examination: Secondary | ICD-10-CM | POA: Diagnosis not present

## 2015-08-08 DIAGNOSIS — I700 Atherosclerosis of aorta: Secondary | ICD-10-CM | POA: Insufficient documentation

## 2015-08-08 DIAGNOSIS — I10 Essential (primary) hypertension: Secondary | ICD-10-CM | POA: Insufficient documentation

## 2015-08-08 DIAGNOSIS — Z79899 Other long term (current) drug therapy: Secondary | ICD-10-CM | POA: Insufficient documentation

## 2015-08-08 DIAGNOSIS — Z7982 Long term (current) use of aspirin: Secondary | ICD-10-CM | POA: Diagnosis not present

## 2015-08-08 DIAGNOSIS — R001 Bradycardia, unspecified: Secondary | ICD-10-CM | POA: Insufficient documentation

## 2015-08-08 HISTORY — DX: Reserved for inherently not codable concepts without codable children: IMO0001

## 2015-08-08 LAB — CBC
HCT: 37.9 % (ref 36.0–46.0)
Hemoglobin: 11.3 g/dL — ABNORMAL LOW (ref 12.0–15.0)
MCH: 25.1 pg — ABNORMAL LOW (ref 26.0–34.0)
MCHC: 29.8 g/dL — ABNORMAL LOW (ref 30.0–36.0)
MCV: 84 fL (ref 78.0–100.0)
Platelets: 233 10*3/uL (ref 150–400)
RBC: 4.51 MIL/uL (ref 3.87–5.11)
RDW: 18.1 % — ABNORMAL HIGH (ref 11.5–15.5)
WBC: 8.8 10*3/uL (ref 4.0–10.5)

## 2015-08-08 LAB — URINALYSIS, ROUTINE W REFLEX MICROSCOPIC
Bilirubin Urine: NEGATIVE
Glucose, UA: NEGATIVE mg/dL
Hgb urine dipstick: NEGATIVE
Ketones, ur: NEGATIVE mg/dL
Nitrite: NEGATIVE
Protein, ur: 30 mg/dL — AB
Specific Gravity, Urine: 1.015 (ref 1.005–1.030)
pH: 5.5 (ref 5.0–8.0)

## 2015-08-08 LAB — COMPREHENSIVE METABOLIC PANEL
ALT: 11 U/L — ABNORMAL LOW (ref 14–54)
AST: 16 U/L (ref 15–41)
Albumin: 3.3 g/dL — ABNORMAL LOW (ref 3.5–5.0)
Alkaline Phosphatase: 93 U/L (ref 38–126)
Anion gap: 10 (ref 5–15)
BUN: 24 mg/dL — ABNORMAL HIGH (ref 6–20)
CO2: 23 mmol/L (ref 22–32)
Calcium: 9.2 mg/dL (ref 8.9–10.3)
Chloride: 107 mmol/L (ref 101–111)
Creatinine, Ser: 1.49 mg/dL — ABNORMAL HIGH (ref 0.44–1.00)
GFR calc Af Amer: 37 mL/min — ABNORMAL LOW (ref >60)
GFR calc non Af Amer: 32 mL/min — ABNORMAL LOW (ref >60)
Glucose, Bld: 169 mg/dL — ABNORMAL HIGH (ref 65–99)
Potassium: 4.5 mmol/L (ref 3.5–5.1)
Sodium: 140 mmol/L (ref 135–145)
Total Bilirubin: 0.4 mg/dL (ref 0.3–1.2)
Total Protein: 7.1 g/dL (ref 6.5–8.1)

## 2015-08-08 LAB — URINE MICROSCOPIC-ADD ON

## 2015-08-08 LAB — SURGICAL PCR SCREEN
MRSA, PCR: NEGATIVE
Staphylococcus aureus: NEGATIVE

## 2015-08-08 LAB — PROTIME-INR
INR: 1.05 (ref 0.00–1.49)
Prothrombin Time: 13.9 seconds (ref 11.6–15.2)

## 2015-08-08 LAB — APTT: aPTT: 35 seconds (ref 24–37)

## 2015-08-08 NOTE — Progress Notes (Addendum)
PCP is Dr Caleb Popp Denies ever seeing a cardiologist. Denies ever having a card cath, stress test or echo.

## 2015-08-08 NOTE — Progress Notes (Signed)
Dr Lorin Mercy office called (spoke with April) informed of urine results.

## 2015-08-08 NOTE — Pre-Procedure Instructions (Signed)
Valerie Ramirez  08/08/2015      [address] [place] [address] Phone: [phone] Fax: [phone]  [place], [place] [id number] [place] [address] Phone: [phone] Fax: [phone]    Your procedure is scheduled on  May 1  Report to [facility] at 1030 A.M.  Call this number if you have problems the morning of surgery:  [phone]   Remember:  Do not eat food or drink liquids after midnight.  Take these medicines the morning of surgery with A SIP OF WATER amlodipine (norvasc), doxazosin (Cardura), Duloxetine (Cymbalta), Metoprolol (lopressor)   Stop taking aspirin, BC's, Goody's, Herbal medications, Fish Oil, Aleve, Ibuprofen, Advil, Motrin, Bayer back and Body   Do not wear jewelry, make-up or nail polish.  Do not wear lotions, powders, or perfumes.  You may wear deodorant.  Do not shave 48 hours prior to surgery.  Men may shave face and neck.  Do not bring valuables to the hospital.  [facility] is not responsible for any belongings or valuables.  Contacts, dentures or bridgework may not be worn into surgery.  Leave your suitcase in the car.  After surgery it may be brought to your room.  For patients admitted to the hospital, discharge time will be determined by your treatment team.  Patients discharged the day of surgery will not be allowed to drive home.    Special instructions:  [facility] - Preparing for Surgery  Before surgery, you can play an important role.  Because skin is not sterile, your skin needs to be as free of germs as possible.  You can reduce the number of germs on you skin by washing with CHG (chlorahexidine gluconate) soap before surgery.  CHG is an antiseptic cleaner which kills germs and bonds with the skin to continue killing germs even after washing.  Please DO  NOT use if you have an allergy to CHG or antibacterial soaps.  If your skin becomes reddened/irritated stop using the CHG and inform your nurse when you arrive at Short Stay.  Do not shave (including legs and underarms) for at least 48 hours prior to the first CHG shower.  You may shave your face.  Please follow these instructions carefully:   1.  Shower with CHG Soap the night before surgery and the                                morning of Surgery.  2.  If you choose to wash your hair, wash your hair first as usual with your       normal shampoo.  3.  After you shampoo, rinse your hair and body thoroughly to remove the                      Shampoo.  4.  Use CHG as you would any other liquid soap.  You can apply chg directly       to the skin and wash gently with scrungie or a clean washcloth.  5.  Apply the CHG Soap to your body ONLY FROM THE NECK DOWN.        Do not use on open wounds or open sores.  Avoid contact with your eyes,  ears, mouth and genitals (private parts).  Wash genitals (private parts)       with your normal soap.  6.  Wash thoroughly, paying special attention to the area where your surgery        will be performed.  7.  Thoroughly rinse your body with warm water from the neck down.  8.  DO NOT shower/wash with your normal soap after using and rinsing off       the CHG Soap.  9.  Pat yourself dry with a clean towel.            10.  Wear clean pajamas.            11.  Place clean sheets on your bed the night of your first shower and do not        sleep with pets.  Day of Surgery  Do not apply any lotions/deoderants the morning of surgery.  Please wear clean clothes to the hospital/surgery center.     Please read over the following fact sheets that you were given. Pain Booklet, Coughing and Deep Breathing, MRSA Information and Surgical Site Infection Prevention

## 2015-08-11 NOTE — Progress Notes (Signed)
Anesthesia Chart Review:  Pt is an 82 year old female scheduled for C5-6, C6-7 ACDF on 08/18/2015 with Dr. Lorin Mercy.   PCP is Dr. Loura Pardon.   PMH includes:  HTN, hyperlipidemia, hypothyroidism, endometrial cancer, GERD. Never smoker. BMI 45. S/p exploratory laparotomy, TAH, BSO, small bowel resection 07/04/12.   Medications include: amlodipine, ASA, doxazosin, lisinopril, metoprolol  Preoperative labs reviewed.  Many bacteria on UA reported to Dr. Lorin Mercy' office by PAT RN.   Chest x-ray 08/08/15 reviewed.  - Chronic lung changes with no evidence of superimposed acute cardiopulmonary disease. - Re- demonstration of borderline cardiomegaly and atherosclerosis.  EKG 08/08/15: Sinus bradycardia (57 bpm) with marked sinus arrhythmia. Nonspecific ST and T wave abnormality  If no changes, I anticipate pt can proceed with surgery as scheduled.   Willeen Cass, FNP-BC [facility]/Anesthesiology Phone: [phone] 08/11/2015 2:16 PM

## 2015-08-15 ENCOUNTER — Ambulatory Visit: Payer: Medicare Other | Admitting: Family Medicine

## 2015-08-17 ENCOUNTER — Encounter (HOSPITAL_COMMUNITY): Payer: Self-pay | Admitting: Anesthesiology

## 2015-08-17 MED ORDER — SODIUM CHLORIDE 0.9 % IV SOLN
1500.0000 mg | INTRAVENOUS | Status: AC
  Administered 2015-08-18: 1500 mg via INTRAVENOUS
  Filled 2015-08-17: qty 1500

## 2015-08-17 NOTE — H&P (Signed)
Valerie Ramirez is an 82 y.o. female.   An 82 year old female returns with persistent neck pain that radiates into her right side.  She states now the right is hurting as bad as the left which has been chronic.  She has had pain for greater than 6 months.  She has taken Norco, anti-inflammatories, Medrol Dosepak.  She has known spondylosis at C5-6, C6-7.  The patient is normally followed by Dr. Loura Pardon.     CURRENT MEDICATIONS:   She has been on Norvasc, metoprolol, doxazosin, lisinopril, Cymbalta.   PAST SURGICAL HISTORY:   She has had knee surgeries which are both doing well.   FAMILY HISTORY:   Positive for hypertension.   SOCIAL HISTORY:   The patient is married, housewife.     REVIEW OF SYSTEMS:   Positive for arthritis, bladder problems, cataracts, hypertension, scarlet fever many years ago.      Past Medical History  Diagnosis Date  . Hypertension   . Hypothyroid   . Arthritis     OA  . Hyperlipidemia   . GERD (gastroesophageal reflux disease)   . Cancer [facility])     endometrial  . Kidney stone   . Shortness of breath dyspnea     Past Surgical History  Procedure Laterality Date  . Cholecystectomy    . Tubal ligation    . Knee arthroplasty  05/23/1998    total right  . Polyp removal  1999  . Hand surgery  12/2008    after hand fx/due to fall  . Joint replacement Bilateral 07/1998    knees  . Bone spur removed Right     shoulder  . Dilation and curettage of uterus  1999  . Eye surgery Bilateral     cataracts  . Laparotomy Bilateral 07/04/2012    Procedure: EXPLORATORY LAPAROTOMY TOTAL ABDOMINAL HYSTERECTOMY BILATERAL SALPINGO-OOPHORECTOMY with small bowel resection ;  Surgeon: Alvino Chapel, MD;  Location: [facility];  Service: Gynecology;  Laterality: Bilateral;  . Bowel resection  07/04/2012    Procedure: SMALL BOWEL RESECTION;  Surgeon: Alvino Chapel, MD;  Location: [facility];  Service: Gynecology;;    Family History  Problem Relation Age of  Onset  . Heart disease Sister     CABG  . Stroke Sister   . Heart disease Brother     CABG  . Cancer Brother     LEUKEMIA  . Cancer Brother     LUNG  . Cancer Brother     STOMACH   Social History:  reports that she has never smoked. She has never used smokeless tobacco. She reports that she does not drink alcohol or use illicit drugs.  Allergies:  Allergies  Allergen Reactions  . Allopurinol Nausea Only  . Amoxicillin-Pot Clavulanate     REACTION: stomach upset  . Lipitor [Atorvastatin Calcium] Other (See Comments)    Leg ache and ache all over  . Simvastatin     REACTION: muscle pain  . Tape Itching and Rash    "Only the paper tape" --- also causes blisters.     No prescriptions prior to admission    No results found for this or any previous visit (from the past 48 hour(s)). No results found.  Review of Systems  Constitutional: Negative.   Eyes: Negative.   Respiratory: Negative.   Cardiovascular: Negative.   Gastrointestinal: Negative.   Genitourinary: Negative.   Musculoskeletal: Positive for neck pain.  Skin: Negative.     There were no vitals  taken for this visit. Physical Exam  Constitutional: She is oriented to person, place, and time. No distress.  HENT:  Head: Atraumatic.  Eyes: EOM are normal.  Cardiovascular: Normal rate.   Respiratory: No respiratory distress.  GI: There is no tenderness.  Musculoskeletal: She exhibits tenderness.  Neurological: She is alert and oriented to person, place, and time.  Skin: Skin is warm and dry.  Psychiatric: She has a normal mood and affect.     PHYSICAL EXAMINATION:  The patient is 5 feet 10 inches.  Weight 240 pounds.  BMI is 43.9.  Extraocular movements intact.  Good visual acuity with glasses.  C-spine shows pain with forward flexion.  She has bilateral brachial plexus tenderness.  Reflexes are 2+ and symmetrical.  Lower extremity reflexes show no hyperreflexia.  No audible wheezing.  No abdominal  tenderness.  Hip range of motion is normal.  Normal heel-toe gait.   RADIOGRAPHS/TEST:   MRI 07/23/2015 shows significant spondylosis with spurring, flattening of the cord C5-6, C6-7.  Canal diameter is 6-7 mm at both levels.  Mild narrowing at other levels.      PLAN:  Assessment/Plan C5-6, C6-7 HNP,stenosis C5-6, C6-7 Anterior Cervical Discectomy and Fusion, Allograft, Plate  She states she is not able to tolerate her symptoms.  Wants to proceed with operative intervention.  We discussed overnight stay in the hospital.  She has been treated with prednisone pack, Norco, exercise program.  No evidence of myelopathy on MRI nor on exam.  No myelomalacia.  Risk of pseudoarthrosis.  Need for posterior fusion.  Dysphasia, dysphonia, anesthetic problems discussed.  She understands and request we proceed.      Lanae Crumbly, PA-C 08/17/2015, 5:46 PM

## 2015-08-18 ENCOUNTER — Inpatient Hospital Stay (HOSPITAL_COMMUNITY): Payer: Medicare Other

## 2015-08-18 ENCOUNTER — Inpatient Hospital Stay (HOSPITAL_COMMUNITY): Payer: Medicare Other | Admitting: Emergency Medicine

## 2015-08-18 ENCOUNTER — Inpatient Hospital Stay (HOSPITAL_COMMUNITY): Payer: Medicare Other | Admitting: Anesthesiology

## 2015-08-18 ENCOUNTER — Inpatient Hospital Stay (HOSPITAL_COMMUNITY)
Admission: RE | Admit: 2015-08-18 | Discharge: 2015-08-19 | DRG: 473 | Disposition: A | Discharge status: Home / Self Care | Payer: Medicare Other | Source: Ambulatory Visit | Attending: Orthopaedic Surgery | Admitting: Orthopaedic Surgery

## 2015-08-18 ENCOUNTER — Encounter (HOSPITAL_COMMUNITY)
Admission: RE | Disposition: A | Discharge status: Home / Self Care | Payer: Self-pay | Source: Ambulatory Visit | Attending: Orthopaedic Surgery

## 2015-08-18 ENCOUNTER — Encounter (HOSPITAL_COMMUNITY): Payer: Self-pay | Admitting: *Deleted

## 2015-08-18 DIAGNOSIS — M4806 Spinal stenosis, lumbar region: Secondary | ICD-10-CM | POA: Diagnosis not present

## 2015-08-18 DIAGNOSIS — Z888 Allergy status to other drugs, medicaments and biological substances status: Secondary | ICD-10-CM

## 2015-08-18 DIAGNOSIS — K219 Gastro-esophageal reflux disease without esophagitis: Secondary | ICD-10-CM | POA: Diagnosis not present

## 2015-08-18 DIAGNOSIS — Z91048 Other nonmedicinal substance allergy status: Secondary | ICD-10-CM

## 2015-08-18 DIAGNOSIS — Z881 Allergy status to other antibiotic agents status: Secondary | ICD-10-CM

## 2015-08-18 DIAGNOSIS — Z981 Arthrodesis status: Secondary | ICD-10-CM

## 2015-08-18 DIAGNOSIS — Z419 Encounter for procedure for purposes other than remedying health state, unspecified: Secondary | ICD-10-CM

## 2015-08-18 DIAGNOSIS — M542 Cervicalgia: Secondary | ICD-10-CM | POA: Diagnosis not present

## 2015-08-18 DIAGNOSIS — M4722 Other spondylosis with radiculopathy, cervical region: Secondary | ICD-10-CM | POA: Diagnosis not present

## 2015-08-18 DIAGNOSIS — E785 Hyperlipidemia, unspecified: Secondary | ICD-10-CM | POA: Diagnosis present

## 2015-08-18 DIAGNOSIS — M4802 Spinal stenosis, cervical region: Secondary | ICD-10-CM | POA: Diagnosis present

## 2015-08-18 DIAGNOSIS — Z8249 Family history of ischemic heart disease and other diseases of the circulatory system: Secondary | ICD-10-CM

## 2015-08-18 DIAGNOSIS — I10 Essential (primary) hypertension: Secondary | ICD-10-CM | POA: Diagnosis present

## 2015-08-18 DIAGNOSIS — E039 Hypothyroidism, unspecified: Secondary | ICD-10-CM | POA: Diagnosis present

## 2015-08-18 DIAGNOSIS — M47812 Spondylosis without myelopathy or radiculopathy, cervical region: Secondary | ICD-10-CM | POA: Diagnosis not present

## 2015-08-18 DIAGNOSIS — M1990 Unspecified osteoarthritis, unspecified site: Secondary | ICD-10-CM | POA: Diagnosis not present

## 2015-08-18 HISTORY — PX: ANTERIOR CERVICAL DECOMP/DISCECTOMY FUSION: SHX1161

## 2015-08-18 SURGERY — ANTERIOR CERVICAL DECOMPRESSION/DISCECTOMY FUSION 2 LEVELS
Anesthesia: General

## 2015-08-18 MED ORDER — SODIUM CHLORIDE 0.9% FLUSH: 3.0000 mL | INTRAVENOUS | Status: DC | PRN

## 2015-08-18 MED ORDER — POLYETHYLENE GLYCOL 3350 17 G PO PACK: 17.0000 g | PACK | Freq: Every day | ORAL | Status: DC | PRN

## 2015-08-18 MED ORDER — KETOROLAC TROMETHAMINE 30 MG/ML IJ SOLN: 30.0000 mg | Freq: Once | INTRAMUSCULAR | Status: DC

## 2015-08-18 MED ORDER — MENTHOL 3 MG MT LOZG: 1.0000 | LOZENGE | OROMUCOSAL | Status: DC | PRN

## 2015-08-18 MED ORDER — METOPROLOL TARTRATE 100 MG PO TABS: 100.0000 mg | ORAL_TABLET | Freq: Two times a day (BID) | ORAL | Status: DC

## 2015-08-18 MED ORDER — DULOXETINE HCL 20 MG PO CPEP: 20.0000 mg | ORAL_CAPSULE | Freq: Every day | ORAL | Status: DC

## 2015-08-18 MED ORDER — BUPIVACAINE-EPINEPHRINE 0.25% -1:200000 IJ SOLN
INTRAMUSCULAR | Status: DC | PRN
  Administered 2015-08-18: 6 mL

## 2015-08-18 MED ORDER — LISINOPRIL 5 MG PO TABS
5.0000 mg | ORAL_TABLET | Freq: Every day | ORAL | Status: DC
  Administered 2015-08-19: 5 mg via ORAL
  Filled 2015-08-18: qty 1

## 2015-08-18 MED ORDER — ACETAMINOPHEN 325 MG PO TABS
650.0000 mg | ORAL_TABLET | ORAL | Status: DC | PRN
  Administered 2015-08-18: 650 mg via ORAL
  Filled 2015-08-18: qty 2

## 2015-08-18 MED ORDER — DOXAZOSIN MESYLATE 8 MG PO TABS: 8.0000 mg | ORAL_TABLET | Freq: Every day | ORAL | Status: DC

## 2015-08-18 MED ORDER — HEMOSTATIC AGENTS (NO CHARGE) OPTIME
TOPICAL | Status: DC | PRN
  Administered 2015-08-18: 1 via TOPICAL

## 2015-08-18 MED ORDER — SODIUM CHLORIDE 0.45 % IV SOLN
INTRAVENOUS | Status: DC
  Administered 2015-08-18: 18:00:00 via INTRAVENOUS

## 2015-08-18 MED ORDER — PROPOFOL 10 MG/ML IV BOLUS
INTRAVENOUS | Status: DC | PRN
  Administered 2015-08-18: 200 mg via INTRAVENOUS
  Administered 2015-08-18: 20 mg via INTRAVENOUS

## 2015-08-18 MED ORDER — CHLORHEXIDINE GLUCONATE 4 % EX LIQD: 60.0000 mL | Freq: Once | CUTANEOUS | Status: DC

## 2015-08-18 MED ORDER — HYDROCODONE-ACETAMINOPHEN 5-325 MG PO TABS
1.0000 | ORAL_TABLET | Freq: Four times a day (QID) | ORAL | Status: DC | PRN
  Administered 2015-08-19: 1 via ORAL
  Filled 2015-08-18: qty 1

## 2015-08-18 MED ORDER — SODIUM CHLORIDE 0.9% FLUSH: 3.0000 mL | Freq: Two times a day (BID) | INTRAVENOUS | Status: DC

## 2015-08-18 MED ORDER — PHENOL 1.4 % MT LIQD: 1.0000 | OROMUCOSAL | Status: DC | PRN

## 2015-08-18 MED ORDER — NEOSTIGMINE METHYLSULFATE 10 MG/10ML IV SOLN
INTRAVENOUS | Status: DC | PRN
  Administered 2015-08-18: 4 mg via INTRAVENOUS

## 2015-08-18 MED ORDER — DEXTROSE 5 % IV SOLN
10.0000 mg | INTRAVENOUS | Status: DC | PRN
  Administered 2015-08-18: 30 ug/min via INTRAVENOUS

## 2015-08-18 MED ORDER — SODIUM CHLORIDE 0.9 % IV SOLN
250.0000 mL | INTRAVENOUS | Status: DC
Start: 2015-08-18 — End: 2015-08-19

## 2015-08-18 MED ORDER — ONDANSETRON HCL 4 MG/2ML IJ SOLN: 4.0000 mg | Freq: Once | INTRAMUSCULAR | Status: DC | PRN

## 2015-08-18 MED ORDER — DULOXETINE HCL 20 MG PO CPEP
20.0000 mg | ORAL_CAPSULE | Freq: Every day | ORAL | Status: DC
  Administered 2015-08-19: 20 mg via ORAL
  Filled 2015-08-18: qty 1

## 2015-08-18 MED ORDER — 0.9 % SODIUM CHLORIDE (POUR BTL) OPTIME
TOPICAL | Status: DC | PRN
  Administered 2015-08-18: 1000 mL

## 2015-08-18 MED ORDER — DOXAZOSIN MESYLATE 8 MG PO TABS
8.0000 mg | ORAL_TABLET | Freq: Every day | ORAL | Status: DC
  Administered 2015-08-19: 8 mg via ORAL
  Filled 2015-08-18: qty 1

## 2015-08-18 MED ORDER — ROCURONIUM BROMIDE 100 MG/10ML IV SOLN
INTRAVENOUS | Status: DC | PRN
  Administered 2015-08-18: 40 mg via INTRAVENOUS

## 2015-08-18 MED ORDER — AMLODIPINE BESYLATE 5 MG PO TABS
5.0000 mg | ORAL_TABLET | Freq: Every day | ORAL | Status: DC
  Administered 2015-08-19: 5 mg via ORAL
  Filled 2015-08-18: qty 1

## 2015-08-18 MED ORDER — KETOROLAC TROMETHAMINE 30 MG/ML IJ SOLN
INTRAMUSCULAR | Status: AC
  Administered 2015-08-18: 30 mg
  Filled 2015-08-18: qty 1

## 2015-08-18 MED ORDER — BUPIVACAINE-EPINEPHRINE (PF) 0.25% -1:200000 IJ SOLN
INTRAMUSCULAR | Status: AC
  Filled 2015-08-18: qty 30

## 2015-08-18 MED ORDER — SUCCINYLCHOLINE CHLORIDE 20 MG/ML IJ SOLN
INTRAMUSCULAR | Status: DC | PRN
  Administered 2015-08-18: 120 mg via INTRAVENOUS

## 2015-08-18 MED ORDER — LISINOPRIL 5 MG PO TABS: 5.0000 mg | ORAL_TABLET | Freq: Every day | ORAL | Status: DC

## 2015-08-18 MED ORDER — LACTATED RINGERS IV SOLN
INTRAVENOUS | Status: DC
  Administered 2015-08-18: 11:00:00 via INTRAVENOUS

## 2015-08-18 MED ORDER — DOCUSATE SODIUM 100 MG PO CAPS
100.0000 mg | ORAL_CAPSULE | Freq: Two times a day (BID) | ORAL | Status: DC
  Administered 2015-08-18 – 2015-08-19 (×2): 100 mg via ORAL
  Filled 2015-08-18 (×2): qty 1

## 2015-08-18 MED ORDER — FENTANYL CITRATE (PF) 100 MCG/2ML IJ SOLN
INTRAMUSCULAR | Status: DC | PRN
  Administered 2015-08-18 (×2): 100 ug via INTRAVENOUS

## 2015-08-18 MED ORDER — LIDOCAINE HCL (CARDIAC) 20 MG/ML IV SOLN
INTRAVENOUS | Status: DC | PRN
  Administered 2015-08-18: 100 mg via INTRAVENOUS

## 2015-08-18 MED ORDER — LACTATED RINGERS IV SOLN
INTRAVENOUS | Status: DC | PRN
  Administered 2015-08-18: 13:00:00 via INTRAVENOUS

## 2015-08-18 MED ORDER — MEPERIDINE HCL 25 MG/ML IJ SOLN: 6.2500 mg | INTRAMUSCULAR | Status: DC | PRN

## 2015-08-18 MED ORDER — HYDROMORPHONE HCL 1 MG/ML IJ SOLN
0.2500 mg | INTRAMUSCULAR | Status: DC | PRN
Start: 2015-08-18 — End: 2015-08-18

## 2015-08-18 MED ORDER — ACETAMINOPHEN 650 MG RE SUPP: 650.0000 mg | RECTAL | Status: DC | PRN

## 2015-08-18 MED ORDER — GLYCOPYRROLATE 0.2 MG/ML IJ SOLN
INTRAMUSCULAR | Status: DC | PRN
  Administered 2015-08-18: 0.6 mg via INTRAVENOUS
  Administered 2015-08-18: 0.2 mg via INTRAVENOUS

## 2015-08-18 MED ORDER — ONDANSETRON HCL 4 MG/2ML IJ SOLN: 4.0000 mg | INTRAMUSCULAR | Status: DC | PRN

## 2015-08-18 MED ORDER — VANCOMYCIN HCL IN DEXTROSE 1-5 GM/200ML-% IV SOLN
1000.0000 mg | INTRAVENOUS | Status: DC
  Filled 2015-08-18: qty 200

## 2015-08-18 MED ORDER — METOPROLOL TARTRATE 100 MG PO TABS
100.0000 mg | ORAL_TABLET | Freq: Two times a day (BID) | ORAL | Status: DC
  Administered 2015-08-18 – 2015-08-19 (×2): 100 mg via ORAL
  Filled 2015-08-18 (×2): qty 1

## 2015-08-18 MED ORDER — EPHEDRINE SULFATE 50 MG/ML IJ SOLN
INTRAMUSCULAR | Status: DC | PRN
  Administered 2015-08-18: 10 mg via INTRAVENOUS

## 2015-08-18 SURGICAL SUPPLY — 55 items
APL SKNCLS STERI-STRIP NONHPOA (GAUZE/BANDAGES/DRESSINGS) ×1
BENZOIN TINCTURE PRP APPL 2/3 (GAUZE/BANDAGES/DRESSINGS) ×2 IMPLANT
BIT DRILL SKYLINE 12MM (BIT) IMPLANT
BLADE SURG ROTATE 9660 (MISCELLANEOUS) IMPLANT
BONE CERV LORDOTIC 14.5X12X7 (Bone Implant) ×4 IMPLANT
BUR ROUND FLUTED 4 SOFT TCH (BURR) ×1 IMPLANT
COLLAR CERV LO CONTOUR FIRM DE (SOFTGOODS) IMPLANT
CORDS BIPOLAR (ELECTRODE) ×2 IMPLANT
COVER SURGICAL LIGHT HANDLE (MISCELLANEOUS) ×2 IMPLANT
CRADLE DONUT ADULT HEAD (MISCELLANEOUS) ×2 IMPLANT
DRAPE C-ARM 42X72 X-RAY (DRAPES) ×2 IMPLANT
DRAPE MICROSCOPE LEICA (MISCELLANEOUS) ×2 IMPLANT
DRAPE PROXIMA HALF (DRAPES) ×2 IMPLANT
DRILL BIT SKYLINE 12MM (BIT) ×2
DRSG MEPILEX BORDER 4X4 (GAUZE/BANDAGES/DRESSINGS) ×1 IMPLANT
DURAPREP 6ML APPLICATOR 50/CS (WOUND CARE) ×2 IMPLANT
ELECT COATED BLADE 2.86 ST (ELECTRODE) ×2 IMPLANT
ELECT REM PT RETURN 9FT ADLT (ELECTROSURGICAL) ×2
ELECTRODE REM PT RTRN 9FT ADLT (ELECTROSURGICAL) ×1 IMPLANT
EVACUATOR 1/8 PVC DRAIN (DRAIN) ×2 IMPLANT
GAUZE SPONGE 4X4 12PLY STRL (GAUZE/BANDAGES/DRESSINGS) ×2 IMPLANT
GAUZE XEROFORM 1X8 LF (GAUZE/BANDAGES/DRESSINGS) IMPLANT
GLOVE BIOGEL PI IND STRL 8 (GLOVE) ×2 IMPLANT
GLOVE BIOGEL PI INDICATOR 8 (GLOVE) ×2
GLOVE ORTHO TXT STRL SZ7.5 (GLOVE) ×4 IMPLANT
GOWN STRL REUS W/ TWL LRG LVL3 (GOWN DISPOSABLE) ×1 IMPLANT
GOWN STRL REUS W/ TWL XL LVL3 (GOWN DISPOSABLE) ×1 IMPLANT
GOWN STRL REUS W/TWL 2XL LVL3 (GOWN DISPOSABLE) ×2 IMPLANT
GOWN STRL REUS W/TWL LRG LVL3 (GOWN DISPOSABLE) ×2
GOWN STRL REUS W/TWL XL LVL3 (GOWN DISPOSABLE) ×2
GRAFT BNE SPCR VG2 14.5X12X7 (Bone Implant) IMPLANT
HEAD HALTER (SOFTGOODS) ×2 IMPLANT
HEMOSTAT SURGICEL 2X14 (HEMOSTASIS) ×1 IMPLANT
KIT BASIN OR (CUSTOM PROCEDURE TRAY) ×2 IMPLANT
KIT ROOM TURNOVER OR (KITS) ×2 IMPLANT
MANIFOLD NEPTUNE II (INSTRUMENTS) IMPLANT
NDL 25GX 5/8IN NON SAFETY (NEEDLE) ×1 IMPLANT
NEEDLE 25GX 5/8IN NON SAFETY (NEEDLE) ×2 IMPLANT
NS IRRIG 1000ML POUR BTL (IV SOLUTION) ×2 IMPLANT
PACK ORTHO CERVICAL (CUSTOM PROCEDURE TRAY) ×2 IMPLANT
PAD ARMBOARD 7.5X6 YLW CONV (MISCELLANEOUS) ×4 IMPLANT
PATTIES SURGICAL .5 X.5 (GAUZE/BANDAGES/DRESSINGS) IMPLANT
PIN TEMP SKYLINE THREADED (PIN) ×1 IMPLANT
PLATE SKYLINE TWO LEVEL 32MM (Plate) ×1 IMPLANT
RESTRAINT LIMB HOLDER UNIV (RESTRAINTS) ×1 IMPLANT
SCREW VARIABLE SELF TAP 12MM (Screw) ×6 IMPLANT
STRIP CLOSURE SKIN 1/2X4 (GAUZE/BANDAGES/DRESSINGS) ×2 IMPLANT
SURGIFLO W/THROMBIN 8M KIT (HEMOSTASIS) ×2 IMPLANT
SUT BONE WAX W31G (SUTURE) ×2 IMPLANT
SUT VIC AB 3-0 X1 27 (SUTURE) ×3 IMPLANT
SUT VICRYL 4-0 PS2 18IN ABS (SUTURE) ×4 IMPLANT
SYR 30ML SLIP (SYRINGE) ×2 IMPLANT
TOWEL OR 17X24 6PK STRL BLUE (TOWEL DISPOSABLE) ×2 IMPLANT
TOWEL OR 17X26 10 PK STRL BLUE (TOWEL DISPOSABLE) ×2 IMPLANT
TRAY FOLEY CATH 16FR SILVER (SET/KITS/TRAYS/PACK) IMPLANT

## 2015-08-18 NOTE — Progress Notes (Signed)
Orthopedic Tech Progress Note Patient Details:  Valerie Ramirez 1932-05-07 [id number]  Ortho Devices Type of Ortho Device: Soft collar Ortho Device/Splint Interventions: Application   [name] 08/18/2015, 5:48 PM

## 2015-08-18 NOTE — Anesthesia Procedure Notes (Signed)
Procedure Name: Intubation Date/Time: 08/18/2015 1:04 PM Performed by: Williemae Area B Pre-anesthesia Checklist: Patient identified, Emergency Drugs available, Suction available and Patient being monitored Patient Re-evaluated:Patient Re-evaluated prior to inductionOxygen Delivery Method: Circle system utilized Preoxygenation: Pre-oxygenation with 100% oxygen Intubation Type: IV induction Ventilation: Mask ventilation without difficulty and Oral airway inserted - appropriate to patient size Laryngoscope Size: Mac and 3 Grade View: Grade II Tube type: Oral Tube size: 7.5 mm Number of attempts: 1 Airway Equipment and Method: Stylet Placement Confirmation: ETT inserted through vocal cords under direct vision,  positive ETCO2 and breath sounds checked- equal and bilateral Secured at: 21 (cm at gum) cm Tube secured with: Tape Dental Injury: Teeth and Oropharynx as per pre-operative assessment

## 2015-08-18 NOTE — Brief Op Note (Signed)
08/18/2015  3:20 PM  PATIENT:  Valerie Ramirez  82 y.o. female  PRE-OPERATIVE DIAGNOSIS:  C5-6, C6-7 Spondylosis, Stenosis  POST-OPERATIVE DIAGNOSIS:  C5-6, C6-7 Spondylosis, Stenosis  PROCEDURE:  Procedure(s): C5-6, C6-7 Anterior Cervical Discectomy and Fusion, Allograft, Plate ([name])  SURGEON:  Surgeon(s) and Role:    Marybelle Killings, MD - Primary  PHYSICIAN ASSISTANT: Anniebelle Devore m. Johnathyn Viscomi     ANESTHESIA:   general  EBL:  Total I/O In: -  Out: 150 [Blood:150]  BLOOD ADMINISTERED:none  DRAINS: none   LOCAL MEDICATIONS USED:  NONE  SPECIMEN:  No Specimen  DISPOSITION OF SPECIMEN:  [name]  COUNTS:  YES  TOURNIQUET:  * No tourniquets in log *  DICTATION: .Dragon Dictation  PLAN OF CARE: Admit to inpatient   PATIENT DISPOSITION:  PACU - hemodynamically stable.

## 2015-08-18 NOTE — Anesthesia Preprocedure Evaluation (Addendum)
Anesthesia Evaluation  Patient identified by MRN, date of birth, ID band Patient awake    Reviewed: Allergy & Precautions, NPO status , Patient's Chart, lab work & pertinent test results, reviewed documented beta blocker date and time   Airway Mallampati: III  TM Distance: >3 FB Neck ROM: Limited    Dental  (+) Edentulous Upper, Edentulous Lower, Dental Advisory Given   Pulmonary shortness of breath,    breath sounds clear to auscultation       Cardiovascular hypertension, Pt. on medications and Pt. on home beta blockers  Rhythm:Regular Rate:Normal     Neuro/Psych    GI/Hepatic GERD  ,  Endo/Other  Hypothyroidism Morbid obesity  Renal/GU Renal disease     Musculoskeletal  (+) Arthritis ,   Abdominal   Peds  Hematology   Anesthesia Other Findings   Reproductive/Obstetrics                           Anesthesia Physical Anesthesia Plan  ASA: III  Anesthesia Plan: General   Post-op Pain Management:    Induction: Intravenous  Airway Management Planned: Oral ETT  Additional Equipment:   Intra-op Plan:   Post-operative Plan: Extubation in OR  Informed Consent: I have reviewed the patients History and Physical, chart, labs and discussed the procedure including the risks, benefits and alternatives for the proposed anesthesia with the patient or authorized representative who has indicated his/her understanding and acceptance.   Dental advisory given  Plan Discussed with: CRNA  Anesthesia Plan Comments: (Pt. Evaluated by Lillia Abed)        Anesthesia Quick Evaluation

## 2015-08-18 NOTE — Interval H&P Note (Signed)
History and Physical Interval Note:  08/18/2015 12:39 PM  Valerie Ramirez  has presented today for surgery, with the diagnosis of C5-6, C6-7 Spondylosis, Stenosis  The various methods of treatment have been discussed with the patient and family. After consideration of risks, benefits and other options for treatment, the patient has consented to  Procedure(s): C5-6, C6-7 Anterior Cervical Discectomy and Fusion, Allograft, Plate ([name]) as a surgical intervention .  The patient's history has been reviewed, patient examined, no change in status, stable for surgery.  I have reviewed the patient's chart and labs.  Questions were answered to the patient's satisfaction.     [name]

## 2015-08-18 NOTE — Progress Notes (Signed)
Pharmacy Antibiotic Note Valerie Ramirez is a 81 y.o. female admitted on 08/18/2015 that is s/p C5-6, C6-7 anterior cervical discectomy and fusion that has drain in place per RN.   Pharmacy has been consulted for vancomycin dosing for post op surgical prophylaxis.  Plan: 1. Begin vancomycin 1 gram every 24 hours starting on 5/2 at 11 am 2. F/u removal of drain and abx LOT  Height: 5\' 2"  (157.5 cm) Weight: 244 lb 11 oz (110.99 kg) IBW/kg (Calculated) : 50.1  Temp (24hrs), Avg:97.9 F (36.6 C), Min:97.5 F (36.4 C), Max:98.3 F (36.8 C)  No results for input(s): WBC, CREATININE, LATICACIDVEN, VANCOTROUGH, VANCOPEAK, VANCORANDOM, GENTTROUGH, GENTPEAK, GENTRANDOM, TOBRATROUGH, TOBRAPEAK, TOBRARND, AMIKACINPEAK, AMIKACINTROU, AMIKACIN in the last 168 hours.  Estimated Creatinine Clearance: 34.8 mL/min (by C-G formula based on Cr of 1.49).    Allergies  Allergen Reactions  . Allopurinol Nausea Only  . Amoxicillin-Pot Clavulanate     REACTION: stomach upset  . Lipitor [Atorvastatin Calcium] Other (See Comments)    Leg ache and ache all over  . Simvastatin     REACTION: muscle pain  . Tape Itching and Rash    "Cannot use paper tape" --- also causes blisters. Use plastic tape    Antimicrobials this admission: 5/1 Vancomycin  >>   Dose adjustments this admission: n/a  Microbiology results: n/a  Thank you for allowing pharmacy to be a part of this patient's care.  Duayne Cal 08/18/2015 5:09 PM

## 2015-08-18 NOTE — Transfer of Care (Signed)
Immediate Anesthesia Transfer of Care Note  Patient: Valerie Ramirez  Procedure(s) Performed: Procedure(s): C5-6, C6-7 Anterior Cervical Discectomy and Fusion, Allograft, Plate ([name])  Patient Location: PACU  Anesthesia Type:General  Level of Consciousness: awake, sedated and patient cooperative  Airway & Oxygen Therapy: Patient Spontanous Breathing and Patient connected to nasal cannula oxygen  Post-op Assessment: Report given to RN, Post -op Vital signs reviewed and stable and Patient moving all extremities  Post vital signs: Reviewed and stable  Last Vitals:  Filed Vitals:   08/18/15 1055 08/18/15 1530  BP: 144/50   Pulse: 57   Temp: 36.8 C 36.7 C  Resp: 20     Last Pain:  Filed Vitals:   08/18/15 1542  PainSc: 0-No pain         Complications: No apparent anesthesia complications

## 2015-08-18 NOTE — Anesthesia Postprocedure Evaluation (Signed)
Anesthesia Post Note  Patient: Valerie Ramirez  Procedure(s) Performed: Procedure(s) (LRB): C5-6, C6-7 Anterior Cervical Discectomy and Fusion, Allograft, Plate ([name])  Patient location during evaluation: PACU Anesthesia Type: General Level of consciousness: sedated and patient cooperative Pain management: pain level controlled Vital Signs Assessment: post-procedure vital signs reviewed and stable Respiratory status: spontaneous breathing Cardiovascular status: stable Anesthetic complications: no    Last Vitals:  Filed Vitals:   08/18/15 1602 08/18/15 1630  BP: 129/56   Pulse: 70   Temp:  36.4 C  Resp: 18     Last Pain:  Filed Vitals:   08/18/15 1636  PainSc: 0-No pain    LLE Motor Response: Purposeful movement;Responds to commands (08/18/15 1630) LLE Sensation: No numbness (08/18/15 1630) RLE Motor Response: Purposeful movement;Responds to commands (08/18/15 1630) RLE Sensation: No numbness (08/18/15 1630)      Nolon Nations

## 2015-08-19 ENCOUNTER — Encounter (HOSPITAL_COMMUNITY): Payer: Self-pay | Admitting: Orthopaedic Surgery

## 2015-08-19 LAB — BASIC METABOLIC PANEL
Anion gap: 10 (ref 5–15)
BUN: 26 mg/dL — ABNORMAL HIGH (ref 6–20)
CO2: 23 mmol/L (ref 22–32)
Calcium: 8.7 mg/dL — ABNORMAL LOW (ref 8.9–10.3)
Chloride: 105 mmol/L (ref 101–111)
Creatinine, Ser: 1.67 mg/dL — ABNORMAL HIGH (ref 0.44–1.00)
GFR calc Af Amer: 32 mL/min — ABNORMAL LOW (ref >60)
GFR calc non Af Amer: 28 mL/min — ABNORMAL LOW (ref >60)
Glucose, Bld: 139 mg/dL — ABNORMAL HIGH (ref 65–99)
Potassium: 5.1 mmol/L (ref 3.5–5.1)
Sodium: 138 mmol/L (ref 135–145)

## 2015-08-19 LAB — CBC
HCT: 32.9 % — ABNORMAL LOW (ref 36.0–46.0)
Hemoglobin: 9.9 g/dL — ABNORMAL LOW (ref 12.0–15.0)
MCH: 25.2 pg — ABNORMAL LOW (ref 26.0–34.0)
MCHC: 30.1 g/dL (ref 30.0–36.0)
MCV: 83.7 fL (ref 78.0–100.0)
Platelets: 213 10*3/uL (ref 150–400)
RBC: 3.93 MIL/uL (ref 3.87–5.11)
RDW: 18 % — ABNORMAL HIGH (ref 11.5–15.5)
WBC: 9.3 10*3/uL (ref 4.0–10.5)

## 2015-08-19 MED ORDER — HYDROCODONE-ACETAMINOPHEN 5-325 MG PO TABS
1.0000 | ORAL_TABLET | Freq: Four times a day (QID) | ORAL | Status: DC | PRN
Start: 2015-08-19 — End: 2016-04-23

## 2015-08-19 NOTE — Discharge Summary (Signed)
Patient ID: Valerie Ramirez MRN: [id number] DOB/AGE: 22-Feb-1933 82 y.o.  Admit date: 08/18/2015 Discharge date: 08/19/2015  Admission Diagnoses:  Active Problems:   Status post cervical spinal fusion   Discharge Diagnoses:  Active Problems:   Status post cervical spinal fusion  status post Procedure(s): C5-6, C6-7 Anterior Cervical Discectomy and Fusion, Allograft, Plate  Past Medical History  Diagnosis Date  . Hypertension   . Hypothyroid   . Arthritis     OA  . Hyperlipidemia   . GERD (gastroesophageal reflux disease)   . Cancer [facility])     endometrial  . Kidney stone   . Shortness of breath dyspnea     Surgeries: Procedure(s): C5-6, C6-7 Anterior Cervical Discectomy and Fusion, Allograft, Plate on [id number]   Consultants:    Discharged Condition: Improved  Hospital Course: Valerie Ramirez is an 82 y.o. female who was admitted 08/18/2015 for operative treatment of cervical stenosis/hnp. Patient failed conservative treatments (please see the history and physical for the specifics) and had severe unremitting pain that affects sleep, daily activities and work/hobbies. After pre-op clearance, the patient was taken to the operating room on 08/18/2015 and underwent  Procedure(s): C5-6, C6-7 Anterior Cervical Discectomy and Fusion, Allograft, Plate.    Patient was given perioperative antibiotics: Anti-infectives    Start     Dose/Rate Route Frequency Ordered Stop   08/19/15 1100  vancomycin (VANCOCIN) IVPB 1000 mg/200 mL premix  Status:  Discontinued     1,000 mg 200 mL/hr over 60 Minutes Intravenous Every 24 hours 08/18/15 1709 08/19/15 1323   08/18/15 1145  vancomycin (VANCOCIN) 1,500 mg in sodium chloride 0.9 % 500 mL IVPB     1,500 mg 250 mL/hr over 120 Minutes Intravenous To Short Stay 08/17/15 1450 08/18/15 1440       Patient was given sequential compression devices and early ambulation to prevent DVT.   Patient benefited maximally from hospital stay and there were  no complications. At the time of discharge, the patient was urinating/moving their bowels without difficulty, tolerating a regular diet, pain is controlled with oral pain medications and they have been cleared by PT/OT.   Recent vital signs: Patient Vitals for the past 24 hrs:  BP Temp Temp src Pulse Resp SpO2  08/19/15 0520 (!) 145/59 mmHg 98 F (36.7 C) Oral 72 18 97 %  08/19/15 0010 (!) 141/60 mmHg 98 F (36.7 C) Oral 72 18 97 %  08/18/15 2018 (!) 131/35 mmHg 98.2 F (36.8 C) Oral 64 18 97 %  08/18/15 1700 (!) 140/52 mmHg 98.9 F (37.2 C) Oral 61 16 97 %  08/18/15 1630 - 97.5 F (36.4 C) - - - -     Recent laboratory studies:  Recent Labs  08/19/15 0342  WBC 9.3  HGB 9.9*  HCT 32.9*  PLT 213  NA 138  K 5.1  CL 105  CO2 23  BUN 26*  CREATININE 1.67*  GLUCOSE 139*  CALCIUM 8.7*     Discharge Medications:     Medication List    TAKE these medications        amLODipine 5 MG tablet  Commonly known as:  NORVASC  Take 1 tablet (5 mg total) by mouth daily.     ASPIRIN PO  Take 2 tablets by mouth daily as needed (back and body aches).     BAYER BACK & BODY PO  Take 1 tablet by mouth every 6 (six) hours as needed (back pain).  colchicine 0.6 MG tablet  Take 0.6 mg by mouth as needed (gout).     COLCRYS 0.6 MG tablet  Generic drug:  colchicine  TAKE 1 TABLET (0.6 MG TOTAL) BY MOUTH 2 (TWO) TIMES DAILY. WITH FOOD AS NEEDED FOR GOUT     vitamin B-12 1000 MCG tablet  Commonly known as:  CYANOCOBALAMIN  Take 1,000 mcg by mouth daily.     cyanocobalamin 500 MCG tablet  Take 1,000 mcg by mouth daily.     doxazosin 8 MG tablet  Commonly known as:  CARDURA  Take 8 mg by mouth daily.     doxazosin 8 MG tablet  Commonly known as:  CARDURA  TAKE 1 TABLET (8 MG TOTAL) BY MOUTH AT BEDTIME.     DULoxetine 20 MG capsule  Commonly known as:  CYMBALTA  Take 20 mg by mouth daily.     DULoxetine 20 MG capsule  Commonly known as:  CYMBALTA  TAKE 1 CAPSULE (20 MG  TOTAL) BY MOUTH DAILY.     ECHINACEA C COMPLETE PO  Take by mouth daily.     HYDROcodone-acetaminophen 5-325 MG tablet  Commonly known as:  NORCO/VICODIN  Take 1-2 tablets by mouth every 6 (six) hours as needed (mild pain).     lisinopril 5 MG tablet  Commonly known as:  PRINIVIL,ZESTRIL  Take 5 mg by mouth daily.     lisinopril 5 MG tablet  Commonly known as:  PRINIVIL,ZESTRIL  Take 1 tablet (5 mg total) by mouth daily.     metoprolol 100 MG tablet  Commonly known as:  LOPRESSOR  Take 100 mg by mouth 2 (two) times daily.     metoprolol 100 MG tablet  Commonly known as:  LOPRESSOR  TAKE 1 TABLET (100 MG TOTAL) BY MOUTH 2 (TWO) TIMES DAILY.        Diagnostic Studies: Dg Chest 2 View  08/08/2015  CLINICAL DATA:  82 year old female with a history of preoperative for cervical fusion EXAM: CHEST - 2 VIEW COMPARISON:  CT 07/06/2012, 06/30/2012 FINDINGS: Cardiomediastinal silhouette unchanged in size and contour, borderline enlarged. Atherosclerosis of the aortic arch. No confluent airspace disease, pneumothorax, or pleural effusion. No displaced fracture. Degenerative changes of the spine. Accentuated kyphotic curvature. Cholecystectomy IMPRESSION: Chronic lung changes with no evidence of superimposed acute cardiopulmonary disease. Re- demonstration of borderline cardiomegaly and atherosclerosis. Signed, Dulcy Fanny. Earleen Newport, DO [facility] Radiology Electronically Signed   By: Corrie Mckusick D.O.   On: 08/08/2015 11:34   Dg Cervical Spine 2-3 Views  08/18/2015  CLINICAL DATA:  C5-6 and C6-7 anterior cervical discectomy and fusion EXAM: DG C-ARM 61-120 MIN; CERVICAL SPINE - 2-3 VIEW COMPARISON:  07/22/2015 FINDINGS: C5 through C7 anterior fusion with interbody fusion masses. Normal alignment. IMPRESSION: Postsurgical change Electronically Signed   By: Skipper Cliche M.D.   On: 08/18/2015 15:23   Mr Cervical Spine Wo Contrast  07/22/2015  CLINICAL  DATA:  Pain in BILATERAL shoulders and neck for 1 year. EXAM: MRI CERVICAL SPINE WITHOUT CONTRAST TECHNIQUE: Multiplanar, multisequence MR imaging of the cervical spine was performed. No intravenous contrast was administered. COMPARISON:  Plain films 03/20/2011. FINDINGS: Alignment: Anatomic. Vertebrae: T2 and STIR hyperintensity of the C6 vertebrae, and to a lesser degree inferior C5 endplate, appear related to chronic degenerative disc disease at C5-6 and C6-7. No worrisome osseous lesion. Cord: Cord flattening at C5-6.  No abnormal cord signal. Posterior Fossa: No tonsillar herniation.  Partial empty sella. Vertebral Arteries: BILATERAL  patent.  RIGHT dominant. Paraspinal tissues: LEFT thyroid nodule, 9 x 11 mm. Consider further evaluation with thyroid ultrasound. If patient is clinically hyperthyroid, consider nuclear medicine thyroid uptake and scan. The individual disc spaces were examined as follows: C2-3:  Normal. C3-4: Mild bulge. Mild disc osteophyte complex. BILATERAL facet arthropathy. No impingement. C4-5: Central protrusion. Posterior element hypertrophy. Left-sided foraminal narrowing. LEFT C5 nerve root impingement is possible. C5-6: Disc space narrowing. Disc osteophyte formation with central and rightward protrusion. Posterior element hypertrophy. Mild to moderate cord flattening. Canal diameter 6-7 mm. RIGHT greater than LEFT C6 nerve root impingement. C6-7: Severe disc space narrowing. Broad-based disc osteophyte complex. Mild cord flattening. Canal diameter 7-8 mm. BILATERAL facet arthropathy. RIGHT greater than LEFT foraminal narrowing affects both C7 nerve roots. C7-T1:  BILATERAL facet arthropathy.  No impingement. IMPRESSION: Multilevel spondylosis as described. Potentially symptomatic neural impingement most likely to derive from disc pathology, and osseous spurring, at C5-6 and C6-7. See discussion above. Electronically Signed   By: Staci Righter M.D.   On: 07/22/2015 18:48   Dg C-arm  1-60 Min  08/18/2015  CLINICAL DATA:  C5-6 and C6-7 anterior cervical discectomy and fusion EXAM: DG C-ARM 61-120 MIN; CERVICAL SPINE - 2-3 VIEW COMPARISON:  07/22/2015 FINDINGS: C5 through C7 anterior fusion with interbody fusion masses. Normal alignment. IMPRESSION: Postsurgical change Electronically Signed   By: Skipper Cliche M.D.   On: 08/18/2015 15:23        Discharge Instructions    Call MD / Call 911    Complete by:  As directed   If you experience chest pain or shortness of breath, CALL 911 and be transported to the hospital emergency room.  If you develope a fever above 101 F, pus (white drainage) or increased drainage or redness at the wound, or calf pain, call your surgeon's office.     Constipation Prevention    Complete by:  As directed   Drink plenty of fluids.  Prune juice may be helpful.  You may use a stool softener, such as Colace (over the counter) 100 mg twice a day.  Use MiraLax (over the counter) for constipation as needed.     Diet - low sodium heart healthy    Complete by:  As directed      Discharge instructions    Complete by:  As directed   Ok to shower 5 days postop.  Do not apply any creams or ointments to incision.  Do not remove steri-strips.  Can use 4x4 gauze and tape for dressing changes.  No aggressive activity. Cervical collar must be on at all times even when showering.  Do not bend or turn neck.     Driving restrictions    Complete by:  As directed   No driving     Increase activity slowly as tolerated    Complete by:  As directed      Lifting restrictions    Complete by:  As directed   No lifting           Follow-up Information    Schedule an appointment as soon as possible for a visit with Marybelle Killings, MD.   Specialty:  Orthopedic Surgery   Why:  need return office visit one week postop   Contact information:   [place] [address] [phone]       Discharge Plan:  discharge to home  Disposition:      Signed: Lanae Crumbly for Dr. Rodell Perna [facility] orthopedics  08/19/2015, 4:14 PM

## 2015-08-19 NOTE — Progress Notes (Signed)
Subjective: 1 Day Post-Op Procedure(s) (LRB): C5-6, C6-7 Anterior Cervical Discectomy and Fusion, Allograft, Plate ([name]) Patient reports pain as mild shoulder discomfort. Pre-op pain relief.    Objective: Vital signs in last 24 hours: Temp:  [97.5 F (36.4 C)-98.9 F (37.2 C)] 98 F (36.7 C) (05/02 0520) Pulse Rate:  [57-84] 72 (05/02 0520) Resp:  [16-20] 18 (05/02 0520) BP: (123-145)/(35-67) 145/59 mmHg (05/02 0520) SpO2:  [97 %-98 %] 97 % (05/02 0520) Weight:  [110.99 kg (244 lb 11 oz)] 110.99 kg (244 lb 11 oz) (05/01 1055)  Intake/Output from previous day: 05/01 0701 - 05/02 0700 In: 2525 [I.V.:2525] Out: 155 [Drains:5; Blood:150] Intake/Output this shift:     Recent Labs  08/19/15 0342  HGB 9.9*    Recent Labs  08/19/15 0342  WBC 9.3  RBC 3.93  HCT 32.9*  PLT 213    Recent Labs  08/19/15 0342  NA 138  K 5.1  CL 105  CO2 23  BUN 26*  CREATININE 1.67*  GLUCOSE 139*  CALCIUM 8.7*   No results for input(s): LABPT, INR in the last 72 hours.  Neurologically intact  Assessment/Plan: 1 Day Post-Op Procedure(s) (LRB): C5-6, C6-7 Anterior Cervical Discectomy and Fusion, Allograft, Plate ([name]) Plan : discharge home.   Bridget Westbrooks C 08/19/2015, 7:27 AM

## 2015-08-19 NOTE — Op Note (Signed)
NAMESYRENITY, KUZNICKI NO.:  [id number]  MEDICAL RECORD NO.:  [id number]  LOCATION:  [id number]                        FACILITY:  [facility]  PHYSICIAN:  Mark C. Lorin Mercy, M.D.    DATE OF BIRTH:  [birth year]  DATE OF PROCEDURE:  08/18/2015 DATE OF DISCHARGE:                              OPERATIVE REPORT   PREOPERATIVE DIAGNOSIS:  Cervical spondylosis with stenosis, C5-6 and C6- 7.  POSTOPERATIVE DIAGNOSIS:  Cervical spondylosis with stenosis, C5-6 and C6-7.  PROCEDURE:  C5-6, C6-7 anterior cervical diskectomy and fusion, allograft and plate.  SURGEON:  Mark C. Lorin Mercy, M.D.  ASSISTANT:  Alyson Locket. Ricard Dillon, PA-C, medically necessary and present for the entire procedure.  ANESTHESIA:  GOT plus 6 mL of Marcaine local.  DRAINS:  One Hemovac neck.  IMPLANTS:  DePuy Synthes Skyline plate, 32-mm length.  12 mm screws x6, VG2 grafts 7 mm x2.  [employer] tissue bank.  DESCRIPTION OF PROCEDURE:  After standard induction of general anesthesia, arms were carefully padded.  Second IV was placed just above the ribs since the other hand was placed on the dorsum of the hand where the wrist restraints were applied for visualization.  Head halter traction was applied without weight.  Neck was prepped with DuraPrep. Area was squared with towels, sterile skin marker, one-half Betadine, Steri-Drape, sterile Mayo stand at the head and thyroid sheet, drapes. Time-out procedure was completed and Ancef prophylaxis.  Incision was made starting at the midline extending to the left overlying the C6 vertebral body based on palpable landmarks.  Platysma was divided. Blunt dissection down to the C5-6 level, which had the most severe stenosis narrowing down to 6 mm.  Large spurs were noted, 25 short needle was placed with a straight clamp, confirmed with the draped C-arm that was brought in for lateral visualization confirming the appropriate level.  This level was immediately marked, taking a chunk of  the disk, taking off the anterior spurs and removing some of the disk with pituitary.  Self-retaining retractors were placed.  The x-rays have been taken out and Cloward retractors were placed.  Teeth blades, right and left, smooth blades, cephalad and caudad.  Operative microscope was draped, brought in, disk was taken down using combination of Cloward curettes, micropituitary and 4-mm bur.  Spurs were removed.  There was hyperemic disk material, which was softer than normal, increased hydration suggesting progressive degenerative process with large amount of extruded disk, no evidence of purulence.  Endplates did not show any erosions suggestive of infection.  Chunks of disk were removed with operative microscope peeling them off the cord until the dura was completely decompressed.  Spurs above were removed since there was combination of hard and soft disk causing compression.  Once the dura was completely decompressed, the patient had some posterior infolding ligaments and 6 graft gave some tight fit, slightly loose and the 7-mm graft was placed in order to distract the posterior infolded ligaments as well as restore normal disk space height.  Graft was countersunk 2 mm, marked anteriorly.  Identical procedure repeated at C6-7.  At this level, there was only trace motion, large spurs were removed. Decompression back to the dura.  Once  the dura was fully decompressed, trial sizer showed a 7-mm gave a nice fit.  Some Surgiflo was squirted in the top of the hemostasis removed and 3-4 minutes, operative field was dry.  Repeat irrigated and then plate was placed, checked under C- arm, held with plate prong and then holes were filled.  Large anterior spurs had to be removed so the plate was sit down flat.  Final spot x- ray showed good position.  There was good position and alignment.  All screws were locked in.  Hemovac was placed in in-and-out technique. During the initial exposure, there  was some venous bleeding just anterior to the carotid, which were transverse branches.  They were controlled with the bipolar as well as some suture 3-0 Vicryl that was placed in figure-of-eight.  This area was rechecked, was dry.  A tiny piece of Surgicel was placed on it, which was 1 x 1.5 cm.  A 3-0 Vicryl on the platysmal and 4-0 Vicryl subcuticular closure, postop dressing, soft cervical collar and transferred to the recovery room.     Mark C. Lorin Mercy, M.D.     MCY/MEDQ  D:  08/18/2015  T:  08/19/2015  Job:  [id number]

## 2015-08-19 NOTE — Evaluation (Signed)
Occupational Therapy Evaluation and Discharge Patient Details Name: Valerie Ramirez MRN: [id number] DOB: 1933/03/23 Today's Date: 08/19/2015    History of Present Illness 82 yo admitted for C5-7 ACDF   Clinical Impression   This 82 yo presents to acute OT with all education completed,we will D/C from acute OT.    Follow Up Recommendations  No OT follow up    Equipment Recommendations  None recommended by OT       Precautions / Restrictions Precautions Precautions: Cervical;Fall Required Braces or Orthoses: Cervical Brace Cervical Brace: Soft collar (off for bathing) Restrictions Weight Bearing Restrictions: No      Mobility Bed Mobility Overal bed mobility: Modified Independent             General bed mobility comments: Up in recliner upon arrival  Transfers Overall transfer level: Needs assistance               General transfer comment: able to sit and stand with ease    Balance Overall balance assessment: No apparent balance deficits (not formally assessed)                                          ADL Overall ADL's : Needs assistance/impaired Eating/Feeding: Modified independent;Sitting Eating/Feeding Details (indicate cue type and reason): Recommend she use straws to drink and putting a washcloth tucked down inside of soft collar to keep it clean while eating Grooming: Modified independent;Sitting Grooming Details (indicate cue type and reason): talked about using straws if needed for use of mouthwash Upper Body Bathing: Sitting;Modified independent   Lower Body Bathing: Modified independent;Sit to/from stand   Upper Body Dressing : Sitting;Modified independent   Lower Body Dressing: Modified independent;Sit to/from Health and safety inspector Details (indicate cue type and reason): Pt reports that she has been getting up to bathroom by herself and pt was independent to Mod I with PT earlier for mobility                            Pertinent Vitals/Pain Pain Assessment: No/denies pain     Hand Dominance Right   Extremity/Trunk Assessment Upper Extremity Assessment Upper Extremity Assessment: Overall WFL for tasks assessed (speaks of her right thumb still being a little numb)     Communication Communication Communication: No difficulties   Cognition Arousal/Alertness: Awake/alert Behavior During Therapy: WFL for tasks assessed/performed Overall Cognitive Status: Within Functional Limits for tasks assessed                                Home Living Family/patient expects to be discharged to:: Private residence Living Arrangements: Children Available Help at Discharge: Family;Available 24 hours/day Type of Home: House Home Access: Stairs to enter [employer] of Steps: 3 Entrance Stairs-Rails: Right Home Layout: One level     Bathroom Shower/Tub: Occupational psychologist: Handicapped height     Home Equipment: Environmental consultant - 2 wheels;Cane - single point;Shower seat - built in          Prior Functioning/Environment Level of Independence: Independent             OT Diagnosis: Generalized weakness         OT Goals(Current goals can be found in the care plan section) Acute Rehab OT  Goals Patient Stated Goal: home today  OT Frequency:                End of Session Nurse Communication:  (Pt ready to go from therapy standpoint)  Activity Tolerance: Patient tolerated treatment well Patient left: in chair;with call bell/phone within reach;with family/visitor present   Time: 1000-1018 OT Time Calculation (min): 18 min Charges:  OT General Charges $OT Visit: 1 Procedure OT Evaluation $OT Eval Moderate Complexity: 1 Procedure  Almon Register [id number] 08/19/2015, 10:58 AM

## 2015-08-19 NOTE — Progress Notes (Signed)
[name] to be D/C'd Home per MD order.  Discussed with the patient and all questions fully answered.  VSS, Skin clean, dry and intact. IV catheter discontinued intact. Site without signs and symptoms of complications. Dressing and pressure applied.  An After Visit Summary was printed and given to the patient. Patient received prescription.  D/c education completed with patient/family including follow up instructions, medication list, d/c activities limitations if indicated, with other d/c instructions as indicated by MD - patient able to verbalize understanding, all questions fully answered.   Patient instructed to return to ED, call 911, or call MD for any changes in condition.   Patient will be escorted via WC, and D/C home via daughter's private auto.  Jerry Caras 08/19/2015 10:09 AM

## 2015-08-19 NOTE — Progress Notes (Signed)
Utilization review completed.  

## 2015-08-19 NOTE — Evaluation (Signed)
Physical Therapy Evaluation and Discharge Patient Details Name: Valerie Ramirez MRN: [id number] DOB: 1932/09/25 Today's Date: 08/19/2015   History of Present Illness  82 yo admitted for C5-7 ACDF  Clinical Impression  Pt pleasant and willing to work with therapy. Pt eager to return home. Pt with mobility, gait, and transfers at mod I/independent level. Educated pt on precautions, log roll, and increasing walking distance. Recommended that pt use a cane when she walks due to history of falls. Pt with safe mobility for discharge. No further therapy needs. Will sign off, with patient aware and agreeable.     Follow Up Recommendations No PT follow up    Equipment Recommendations  None recommended by PT    Recommendations for Other Services       Precautions / Restrictions Precautions Precautions: Cervical;Fall Restrictions Weight Bearing Restrictions: No      Mobility  Bed Mobility Overal bed mobility: Modified Independent                Transfers Overall transfer level: Independent               General transfer comment: able to sit and stand with ease  Ambulation/Gait Ambulation/Gait assistance: Modified independent (Device/Increase time) Ambulation Distance (Feet): 200 Feet Assistive device: None Gait Pattern/deviations: Step-through pattern;Wide base of support Gait velocity: decreased Gait velocity interpretation: Below normal speed for age/gender General Gait Details: pt has increased sway with wide BOS, but no LOB observed throughout ambulation.  Stairs Stairs: Yes Stairs assistance: Modified independent (Device/Increase time) Stair Management: One rail Right;Forwards;Sideways Number of Stairs: 5 General stair comments: patient ascends stairs forwards, but turns slightly towards rail as she descends  Wheelchair Mobility    Modified Rankin (Stroke Patients Only)       Balance Overall balance assessment: No apparent balance deficits (not  formally assessed)                                           Pertinent Vitals/Pain Pain Assessment: No/denies pain    Home Living Family/patient expects to be discharged to:: Private residence Living Arrangements: Spouse/significant other Available Help at Discharge: Family;Available 24 hours/day Type of Home: House Home Access: Stairs to enter Entrance Stairs-Rails: Right Entrance Stairs-Number of Steps: 3 Home Layout: One level Home Equipment: Walker - 2 wheels;Cane - single point;Shower seat - built in      Prior Function Level of Independence: Independent               Journalist, newspaper        Extremity/Trunk Assessment   Upper Extremity Assessment: Defer to OT evaluation           Lower Extremity Assessment: Overall WFL for tasks assessed      Cervical / Trunk Assessment: Kyphotic  Communication   Communication: No difficulties  Cognition Arousal/Alertness: Awake/alert Behavior During Therapy: WFL for tasks assessed/performed Overall Cognitive Status: Within Functional Limits for tasks assessed                      General Comments      Exercises        Assessment/Plan    PT Assessment Patent does not need any further PT services  PT Diagnosis Abnormality of gait;Acute pain   PT Problem List    PT Treatment Interventions     PT Goals (Current goals can be  found in the Care Plan section) Acute Rehab PT Goals Patient Stated Goal: mow the yard PT Goal Formulation: With patient Time For Goal Achievement: 08/26/15 Potential to Achieve Goals: Good    Frequency     Barriers to discharge        Co-evaluation               End of Session Equipment Utilized During Treatment: Gait belt;Cervical collar Activity Tolerance: Patient tolerated treatment well;No increased pain Patient left: in chair;with call bell/phone within reach           Time: 0756-0815 PT Time Calculation (min) (ACUTE ONLY): 19  min   Charges:   PT Evaluation $PT Eval Moderate Complexity: 1 Procedure     PT G CodesMelford Aase 08/19/2015, 9:09 AM   Haze Justin, SPT [phone]

## 2015-08-20 NOTE — Addendum Note (Signed)
Addendum  created 08/20/15 1034 by Terrill Mohr, CRNA   Modules edited: Anesthesia Events, Narrator   Narrator:  Narrator: Event Log Edited

## 2015-09-02 DIAGNOSIS — M4722 Other spondylosis with radiculopathy, cervical region: Secondary | ICD-10-CM | POA: Diagnosis not present

## 2015-09-02 DIAGNOSIS — M7522 Bicipital tendinitis, left shoulder: Secondary | ICD-10-CM | POA: Diagnosis not present

## 2015-09-18 ENCOUNTER — Other Ambulatory Visit: Payer: Self-pay | Admitting: Family Medicine

## 2015-09-30 DIAGNOSIS — M4722 Other spondylosis with radiculopathy, cervical region: Secondary | ICD-10-CM | POA: Diagnosis not present

## 2015-10-02 ENCOUNTER — Other Ambulatory Visit: Payer: Self-pay | Admitting: Family Medicine

## 2015-10-29 ENCOUNTER — Telehealth: Payer: Self-pay | Admitting: Family Medicine

## 2015-10-29 DIAGNOSIS — I10 Essential (primary) hypertension: Secondary | ICD-10-CM

## 2015-10-29 DIAGNOSIS — E7800 Pure hypercholesterolemia, unspecified: Secondary | ICD-10-CM

## 2015-10-29 DIAGNOSIS — R739 Hyperglycemia, unspecified: Secondary | ICD-10-CM

## 2015-10-29 NOTE — Telephone Encounter (Signed)
-----   Message from Ellamae Sia sent at 10/28/2015 10:16 AM EDT ----- Regarding: Lab orders for Friday, 7.14.17 Lab orders for a f/u appt

## 2015-10-31 ENCOUNTER — Other Ambulatory Visit (INDEPENDENT_AMBULATORY_CARE_PROVIDER_SITE_OTHER): Payer: Medicare Other

## 2015-10-31 DIAGNOSIS — R739 Hyperglycemia, unspecified: Secondary | ICD-10-CM | POA: Diagnosis not present

## 2015-10-31 DIAGNOSIS — E7800 Pure hypercholesterolemia, unspecified: Secondary | ICD-10-CM | POA: Diagnosis not present

## 2015-10-31 DIAGNOSIS — I10 Essential (primary) hypertension: Secondary | ICD-10-CM | POA: Diagnosis not present

## 2015-10-31 LAB — LIPID PANEL
Cholesterol: 210 mg/dL — ABNORMAL HIGH (ref 0–200)
HDL: 45.4 mg/dL (ref >39.00)
NonHDL: 164.56
Total CHOL/HDL Ratio: 5
Triglycerides: 212 mg/dL — ABNORMAL HIGH (ref 0.0–149.0)
VLDL: 42.4 mg/dL — ABNORMAL HIGH (ref 0.0–40.0)

## 2015-10-31 LAB — COMPREHENSIVE METABOLIC PANEL
ALT: 10 U/L (ref 0–35)
AST: 13 U/L (ref 0–37)
Albumin: 3.5 g/dL (ref 3.5–5.2)
Alkaline Phosphatase: 93 U/L (ref 39–117)
BUN: 21 mg/dL (ref 6–23)
CO2: 26 mEq/L (ref 19–32)
Calcium: 8.8 mg/dL (ref 8.4–10.5)
Chloride: 105 mEq/L (ref 96–112)
Creatinine, Ser: 1.23 mg/dL — ABNORMAL HIGH (ref 0.40–1.20)
GFR: 44.51 mL/min — ABNORMAL LOW (ref >60.00)
Glucose, Bld: 130 mg/dL — ABNORMAL HIGH (ref 70–99)
Potassium: 4.1 mEq/L (ref 3.5–5.1)
Sodium: 140 mEq/L (ref 135–145)
Total Bilirubin: 0.3 mg/dL (ref 0.2–1.2)
Total Protein: 6.8 g/dL (ref 6.0–8.3)

## 2015-10-31 LAB — CBC WITH DIFFERENTIAL/PLATELET
Basophils Absolute: 0 10*3/uL (ref 0.0–0.1)
Basophils Relative: 0.5 % (ref 0.0–3.0)
Eosinophils Absolute: 0.4 10*3/uL (ref 0.0–0.7)
Eosinophils Relative: 3.9 % (ref 0.0–5.0)
HCT: 32.5 % — ABNORMAL LOW (ref 36.0–46.0)
Hemoglobin: 10.5 g/dL — ABNORMAL LOW (ref 12.0–15.0)
Lymphocytes Relative: 27.5 % (ref 12.0–46.0)
Lymphs Abs: 2.8 10*3/uL (ref 0.7–4.0)
MCHC: 32.3 g/dL (ref 30.0–36.0)
MCV: 83.7 fl (ref 78.0–100.0)
Monocytes Absolute: 1 10*3/uL (ref 0.1–1.0)
Monocytes Relative: 9.9 % (ref 3.0–12.0)
Neutro Abs: 6 10*3/uL (ref 1.4–7.7)
Neutrophils Relative %: 58.2 % (ref 43.0–77.0)
Platelets: 206 10*3/uL (ref 150.0–400.0)
RBC: 3.88 Mil/uL (ref 3.87–5.11)
RDW: 18.9 % — ABNORMAL HIGH (ref 11.5–15.5)
WBC: 10.3 10*3/uL (ref 4.0–10.5)

## 2015-10-31 LAB — TSH: TSH: 1.24 u[IU]/mL (ref 0.35–4.50)

## 2015-10-31 LAB — LDL CHOLESTEROL, DIRECT: Direct LDL: 136 mg/dL

## 2015-10-31 LAB — HEMOGLOBIN A1C: Hgb A1c MFr Bld: 6.4 % (ref 4.6–6.5)

## 2015-11-07 ENCOUNTER — Ambulatory Visit (INDEPENDENT_AMBULATORY_CARE_PROVIDER_SITE_OTHER): Payer: Medicare Other | Admitting: Family Medicine

## 2015-11-07 ENCOUNTER — Encounter: Payer: Self-pay | Admitting: Family Medicine

## 2015-11-07 VITALS — BP 124/68 | HR 60 | Temp 97.7°F | Ht 62.5 in | Wt 239.8 lb

## 2015-11-07 DIAGNOSIS — R739 Hyperglycemia, unspecified: Secondary | ICD-10-CM

## 2015-11-07 DIAGNOSIS — E039 Hypothyroidism, unspecified: Secondary | ICD-10-CM

## 2015-11-07 DIAGNOSIS — E7800 Pure hypercholesterolemia, unspecified: Secondary | ICD-10-CM

## 2015-11-07 DIAGNOSIS — I10 Essential (primary) hypertension: Secondary | ICD-10-CM

## 2015-11-07 DIAGNOSIS — M79671 Pain in right foot: Secondary | ICD-10-CM

## 2015-11-07 DIAGNOSIS — E669 Obesity, unspecified: Secondary | ICD-10-CM

## 2015-11-07 MED ORDER — LISINOPRIL 5 MG PO TABS: ORAL_TABLET | ORAL | Status: DC

## 2015-11-07 MED ORDER — DOXAZOSIN MESYLATE 8 MG PO TABS: ORAL_TABLET | ORAL | Status: DC

## 2015-11-07 MED ORDER — COLCHICINE 0.6 MG PO TABS: ORAL_TABLET | ORAL | Status: DC

## 2015-11-07 MED ORDER — METOPROLOL TARTRATE 100 MG PO TABS: ORAL_TABLET | ORAL | Status: DC

## 2015-11-07 NOTE — Progress Notes (Signed)
Pre visit review using our clinic review tool, if applicable. No additional management support is needed unless otherwise documented below in the visit note. 

## 2015-11-07 NOTE — Patient Instructions (Addendum)
For your foot pain and numbness I would like to refer you to a neurologist eventually - call and let us know when you are ready  Labs are improved (sugar/kidney/anemia) and cholesterol is stable And weight is down 5 lb Keep working on low sugar diet and weight loss  Also low cholesterol Avoid red meat/ fried foods/ egg yolks/ fatty breakfast meats/ butter, cheese and high fat dairy/ and shellfish   Follow up in 6 months for annual exam with labs prior

## 2015-11-07 NOTE — Progress Notes (Signed)
Subjective:    Patient ID: AVERYROSE MILD, female    DOB: [birth year], 82 y.o.   MRN: [id number]  HPI Here for f/u of chronic medical problems   Having problems with her R foot  Sometimes it hurts on the bottom and hard to walk on Feels like the bottom of her foot is "frozen" at night  She has back problems as well  Just started acting up  ? If gout- but no joint problems or swelling (occ takes colchicine)  Had neck surgery in May- fusion  It went well overall  Some relief  Does not have full use of arms yet - unsure if she will/ perhaps  Is not doing PT "not yet" Still waiting for top vertebrae to heal   Wt Readings from Last 3 Encounters:  11/07/15 239 lb 12 oz (108.75 kg)  08/18/15 244 lb 11 oz (110.99 kg)  08/08/15 244 lb 11.2 oz (110.995 kg)   bmi is 43 Has cut down on desserts - is doing better with that - getting easier to do that  That has helped A1C  Not exercising yet but does a lot of work outdoors like mowing   bp is stable today  No cp or palpitations or headaches or edema  No side effects to medicines  BP Readings from Last 3 Encounters:  11/07/15 124/68  08/19/15 145/59  08/08/15 182/86       Chemistry      Component Value Date/Time   NA 140 10/31/2015 0959   K 4.1 10/31/2015 0959   CL 105 10/31/2015 0959   CO2 26 10/31/2015 0959   BUN 21 10/31/2015 0959   CREATININE 1.23* 10/31/2015 0959      Component Value Date/Time   CALCIUM 8.8 10/31/2015 0959   ALKPHOS 93 10/31/2015 0959   AST 13 10/31/2015 0959   ALT 10 10/31/2015 0959   BILITOT 0.3 10/31/2015 0959    Cr is down from last check (was 1.67)   Hypothyroidism  Pt has no clinical changes No change in energy level/ hair or skin/ edema and no tremor Lab Results  Component Value Date   TSH 1.24 10/31/2015      Hx of hyperglycemia Lab Results  Component Value Date   HGBA1C 6.4 10/31/2015   This is down from 6.8 Is on lisinopril Diet controlled   Hx of hyperlipidemia Lab  Results  Component Value Date   CHOL 210* 10/31/2015   CHOL 188 11/06/2014   CHOL 192 08/05/2014   Lab Results  Component Value Date   HDL 45.40 10/31/2015   HDL 48.00 11/06/2014   HDL 50.30 08/05/2014   Lab Results  Component Value Date   LDLCALC 117* 11/06/2014   LDLCALC 118* 08/05/2014   LDLCALC 111* 08/03/2013   Lab Results  Component Value Date   TRIG 212.0* 10/31/2015   TRIG 114.0 11/06/2014   TRIG 118.0 08/05/2014   Lab Results  Component Value Date   CHOLHDL 5 10/31/2015   CHOLHDL 4 11/06/2014   CHOLHDL 4 08/05/2014   Lab Results  Component Value Date   LDLDIRECT 136.0 10/31/2015   LDLDIRECT 115.6 07/28/2012   LDLDIRECT 139.5 10/05/2010  overall stable with mild inc in LDL direct Unable to tolerate statins at all  Diet is fair    Anemia got a little worse after surgery - better now  Lab Results  Component Value Date   WBC 10.3 10/31/2015   HGB 10.5* 10/31/2015   HCT 32.5* 10/31/2015  MCV 83.7 10/31/2015   PLT 206.0 10/31/2015     Patient Active Problem List   Diagnosis Date Noted  . Right foot pain 11/07/2015  . Status post cervical spinal fusion 08/18/2015  . Arm pain 05/16/2015  . Traumatic hematoma of lower leg 11/25/2014  . Abrasion, lower leg, anterior 11/25/2014  . Left hip pain 08/12/2014  . Osteoarthritis, hip, bilateral 08/12/2014  . Hearing loss due to cerumen impaction 06/12/2014  . Hearing loss in right ear 06/12/2014  . Colon cancer screening 08/10/2013  . Seborrheic keratoses, inflamed 08/10/2013  . Excessive cerumen in left ear canal 08/10/2013  . History of fall 04/10/2013  . Encounter for Medicare annual wellness exam 08/08/2012  . Gout 07/14/2012  . Endometrial cancer ([place]) 05/25/2012  . Degenerative joint disease of cervical spine 05/03/2011  . Other screening mammogram 03/31/2011  . Hyperglycemia 09/26/2007  . Obesity 07/12/2007  . GOITER, MULTINODULAR 07/11/2007  . Essential hypertension 07/11/2007  .  CARDIOMYOPATHY 07/11/2007  . OSTEOARTHRITIS 07/11/2007  . 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 Hypothyroidism 10/05/2006  . HYPERCHOLESTEROLEMIA, PURE 10/05/2006   Past Medical History  Diagnosis Date  . Hypertension   . Hypothyroid   . Arthritis     OA  . Hyperlipidemia   . GERD (gastroesophageal reflux disease)   . Cancer [facility])     endometrial  . Kidney stone   . Shortness of breath dyspnea    Past Surgical History  Procedure Laterality Date  . Cholecystectomy    . Tubal ligation    . Knee arthroplasty  05/23/1998    total right  . Polyp removal  1999  . Hand surgery  12/2008    after hand fx/due to fall  . Joint replacement Bilateral 07/1998    knees  . Bone spur removed Right     shoulder  . Dilation and curettage of uterus  1999  . Eye surgery Bilateral     cataracts  . Laparotomy Bilateral 07/04/2012    Procedure: EXPLORATORY LAPAROTOMY TOTAL ABDOMINAL HYSTERECTOMY BILATERAL SALPINGO-OOPHORECTOMY with small bowel resection ;  Surgeon: Alvino Chapel, MD;  Location: [facility];  Service: Gynecology;  Laterality: Bilateral;  . Bowel resection  07/04/2012    Procedure: SMALL BOWEL RESECTION;  Surgeon: Alvino Chapel, MD;  Location: [facility];  Service: Gynecology;;  . Anterior cervical decomp/discectomy fusion [name] 08/18/2015    Procedure: C5-6, C6-7 Anterior Cervical Discectomy and Fusion, Allograft, Plate;  Surgeon: Marybelle Killings, MD;  Location: [facility];  Service: Orthopedics;  Laterality: [name];   Social History  Substance Use Topics  . Smoking status: Never Smoker   . Smokeless tobacco: Never Used  . Alcohol Use: No   Family History  Problem Relation Age of Onset  . Heart disease Sister     CABG  . Stroke Sister   . Heart disease Brother     CABG  . Cancer Brother     LEUKEMIA  . Cancer Brother     LUNG  . Cancer Brother     STOMACH   Allergies  Allergen Reactions  . Allopurinol Nausea Only  . Amoxicillin-Pot Clavulanate      REACTION: stomach upset  . Lipitor [Atorvastatin Calcium] Other (See Comments)    Leg ache and ache all over  . Simvastatin     REACTION: muscle pain  . Tape Itching and Rash    "Cannot use paper tape" --- also causes blisters. Use plastic tape   Current Outpatient Prescriptions on  File Prior to Visit  Medication Sig Dispense Refill  . amLODipine (NORVASC) 5 MG tablet Take 1 tablet (5 mg total) by mouth daily. 90 tablet 3  . ASPIRIN PO Take 2 tablets by mouth daily as needed (back and body aches).    . Aspirin-Caffeine (BAYER BACK & BODY PO) Take 1 tablet by mouth every 6 (six) hours as needed (back pain).    . colchicine 0.6 MG tablet Take 0.6 mg by mouth as needed (gout).    . cyanocobalamin 500 MCG tablet Take 1,000 mcg by mouth daily.     . DULoxetine (CYMBALTA) 20 MG capsule Take 20 mg by mouth daily.    [name] HYDROcodone-acetaminophen (NORCO/VICODIN) 5-325 MG tablet Take 1-2 tablets by mouth every 6 (six) hours as needed (mild pain). 40 tablet 0  . Specialty Vitamins Products (ECHINACEA C COMPLETE PO) Take by mouth daily.    . vitamin B-12 (CYANOCOBALAMIN) 1000 MCG tablet Take 1,000 mcg by mouth daily.     No current facility-administered medications on file prior to visit.    Review of Systems Review of Systems  Constitutional: Negative for fever, appetite change, fatigue and unexpected weight change.  Eyes: Negative for pain and visual disturbance.  Respiratory: Negative for cough and shortness of breath.   Cardiovascular: Negative for cp or palpitations    Gastrointestinal: Negative for nausea, diarrhea and constipation.  Genitourinary: Negative for urgency and frequency.  Skin: Negative for pallor or rash   Neurological: Negative for weakness, light-headedness, and headaches. pos for R foot discomfort and dec sensation  Hematological: Negative for adenopathy. Does not bruise/bleed easily.  Psychiatric/Behavioral: Negative for dysphoric mood. The patient is not nervous/anxious.          Objective:   Physical Exam  Constitutional: She appears well-developed and well-nourished. No distress.  HENT:  Head: Normocephalic and atraumatic.  Mouth/Throat: Oropharynx is clear and moist.  Eyes: Conjunctivae and EOM are normal. Pupils are equal, round, and reactive to light.  Neck: Normal range of motion. Neck supple. No JVD present. Carotid bruit is not present. No thyromegaly present.  Cardiovascular: Normal rate, regular rhythm, normal heart sounds and intact distal pulses.  Exam reveals no gallop.   Pulmonary/Chest: Effort normal and breath sounds normal. No respiratory distress. She has no wheezes. She has no rales.  No crackles  Abdominal: Soft. Bowel sounds are normal. She exhibits no distension, no abdominal bruit and no mass. There is no tenderness.  Musculoskeletal: She exhibits no edema.  Lymphadenopathy:    She has no cervical adenopathy.  Neurological: She is alert. She has normal reflexes. A sensory deficit is present. No cranial nerve deficit. She exhibits normal muscle tone.  Dec sensation over dorsal R foot and toes    Skin: Skin is warm and dry. No rash noted.  Psychiatric: She has a normal mood and affect.          Assessment & Plan:   Problem List Items Addressed This Visit      Cardiovascular and Mediastinum   Essential hypertension - Primary    bp in fair control at this time  BP Readings from Last 1 Encounters:  11/07/15 124/68   No changes needed Disc lifstyle change with low sodium diet and exercise  Labs reviewed       Relevant Medications   metoprolol (LOPRESSOR) 100 MG tablet   lisinopril (PRINIVIL,ZESTRIL) 5 MG tablet   doxazosin (CARDURA) 8 MG tablet     Endocrine   Hypothyroidism  Hypothyroidism  Pt has no clinical changes No change in energy level/ hair or skin/ edema and no tremor Lab Results  Component Value Date   TSH 1.24 10/31/2015          Relevant Medications   metoprolol (LOPRESSOR) 100 MG tablet       Other   Right foot pain    Pt has hx of gout - but this seems different, no swelling and c/o of numb feeling  Nl vasc exam  ? If neuropathy or radiculopathy Pt declines ref to neuro now -enc her to consider if symptoms worsen (for NCV)       Obesity    Discussed how this problem influences overall health and the risks it imposes  Reviewed plan for weight loss with lower calorie diet (via better food choices and also portion control or program like weight watchers) and exercise building up to or more than 30 minutes 5 days per week including some aerobic activity         Hyperglycemia    Lab Results  Component Value Date   HGBA1C 6.4 10/31/2015  down from 6.8 Enc to keep working on low glycemic diet and wt control to prevent DM       HYPERCHOLESTEROLEMIA, PURE   Relevant Medications   metoprolol (LOPRESSOR) 100 MG tablet   lisinopril (PRINIVIL,ZESTRIL) 5 MG tablet   doxazosin (CARDURA) 8 MG tablet

## 2015-11-08 NOTE — Assessment & Plan Note (Signed)
Lab Results  Component Value Date   HGBA1C 6.4 10/31/2015  down from 6.8 Enc to keep working on low glycemic diet and wt control to prevent DM

## 2015-11-08 NOTE — Assessment & Plan Note (Signed)
Discussed how this problem influences overall health and the risks it imposes  Reviewed plan for weight loss with lower calorie diet (via better food choices and also portion control or program like weight watchers) and exercise building up to or more than 30 minutes 5 days per week including some aerobic activity    

## 2015-11-08 NOTE — Assessment & Plan Note (Signed)
bp in fair control at this time  BP Readings from Last 1 Encounters:  11/07/15 124/68   No changes needed Disc lifstyle change with low sodium diet and exercise  Labs reviewed

## 2015-11-08 NOTE — Assessment & Plan Note (Signed)
Pt has hx of gout - but this seems different, no swelling and c/o of numb feeling  Nl vasc exam  ? If neuropathy or radiculopathy Pt declines ref to neuro now -enc her to consider if symptoms worsen (for NCV)

## 2015-11-08 NOTE — Assessment & Plan Note (Signed)
Hypothyroidism  Pt has no clinical changes No change in energy level/ hair or skin/ edema and no tremor Lab Results  Component Value Date   TSH 1.24 10/31/2015

## 2015-11-27 ENCOUNTER — Other Ambulatory Visit: Payer: Self-pay | Admitting: Family Medicine

## 2015-12-12 ENCOUNTER — Other Ambulatory Visit: Payer: Self-pay

## 2015-12-30 DIAGNOSIS — M4722 Other spondylosis with radiculopathy, cervical region: Secondary | ICD-10-CM | POA: Diagnosis not present

## 2016-01-09 DIAGNOSIS — R202 Paresthesia of skin: Secondary | ICD-10-CM | POA: Diagnosis not present

## 2016-01-13 DIAGNOSIS — M542 Cervicalgia: Secondary | ICD-10-CM | POA: Diagnosis not present

## 2016-01-29 DIAGNOSIS — Z23 Encounter for immunization: Secondary | ICD-10-CM | POA: Diagnosis not present

## 2016-02-02 DIAGNOSIS — H353131 Nonexudative age-related macular degeneration, bilateral, early dry stage: Secondary | ICD-10-CM | POA: Diagnosis not present

## 2016-04-23 ENCOUNTER — Encounter (HOSPITAL_COMMUNITY): Payer: Self-pay

## 2016-04-23 ENCOUNTER — Emergency Department (HOSPITAL_COMMUNITY): Payer: Medicare Other

## 2016-04-23 ENCOUNTER — Emergency Department (HOSPITAL_COMMUNITY)
Admission: EM | Admit: 2016-04-23 | Discharge: 2016-04-23 | Disposition: A | Discharge status: Home / Self Care | Payer: Medicare Other | Attending: Emergency Medicine | Admitting: Emergency Medicine

## 2016-04-23 DIAGNOSIS — Y939 Activity, unspecified: Secondary | ICD-10-CM | POA: Insufficient documentation

## 2016-04-23 DIAGNOSIS — M79641 Pain in right hand: Secondary | ICD-10-CM | POA: Diagnosis not present

## 2016-04-23 DIAGNOSIS — S99921A Unspecified injury of right foot, initial encounter: Secondary | ICD-10-CM | POA: Diagnosis not present

## 2016-04-23 DIAGNOSIS — Y929 Unspecified place or not applicable: Secondary | ICD-10-CM | POA: Insufficient documentation

## 2016-04-23 DIAGNOSIS — Z7982 Long term (current) use of aspirin: Secondary | ICD-10-CM | POA: Insufficient documentation

## 2016-04-23 DIAGNOSIS — Z79899 Other long term (current) drug therapy: Secondary | ICD-10-CM | POA: Insufficient documentation

## 2016-04-23 DIAGNOSIS — S52124A Nondisplaced fracture of head of right radius, initial encounter for closed fracture: Secondary | ICD-10-CM | POA: Insufficient documentation

## 2016-04-23 DIAGNOSIS — Z96653 Presence of artificial knee joint, bilateral: Secondary | ICD-10-CM | POA: Insufficient documentation

## 2016-04-23 DIAGNOSIS — M79671 Pain in right foot: Secondary | ICD-10-CM | POA: Diagnosis not present

## 2016-04-23 DIAGNOSIS — S63501A Unspecified sprain of right wrist, initial encounter: Secondary | ICD-10-CM | POA: Insufficient documentation

## 2016-04-23 DIAGNOSIS — Z8542 Personal history of malignant neoplasm of other parts of uterus: Secondary | ICD-10-CM | POA: Insufficient documentation

## 2016-04-23 DIAGNOSIS — Y999 Unspecified external cause status: Secondary | ICD-10-CM | POA: Insufficient documentation

## 2016-04-23 DIAGNOSIS — M25421 Effusion, right elbow: Secondary | ICD-10-CM | POA: Diagnosis not present

## 2016-04-23 DIAGNOSIS — E039 Hypothyroidism, unspecified: Secondary | ICD-10-CM | POA: Insufficient documentation

## 2016-04-23 DIAGNOSIS — W1839XA Other fall on same level, initial encounter: Secondary | ICD-10-CM | POA: Insufficient documentation

## 2016-04-23 DIAGNOSIS — S6991XA Unspecified injury of right wrist, hand and finger(s), initial encounter: Secondary | ICD-10-CM | POA: Diagnosis not present

## 2016-04-23 DIAGNOSIS — I10 Essential (primary) hypertension: Secondary | ICD-10-CM | POA: Insufficient documentation

## 2016-04-23 MED ORDER — HYDROCODONE-ACETAMINOPHEN 5-325 MG PO TABS
1.0000 | ORAL_TABLET | Freq: Once | ORAL | Status: AC
  Administered 2016-04-23: 1 via ORAL
  Filled 2016-04-23: qty 1

## 2016-04-23 MED ORDER — HYDROCODONE-ACETAMINOPHEN 5-325 MG PO TABS: 1.0000 | ORAL_TABLET | Freq: Four times a day (QID) | ORAL | 0 refills | Status: DC | PRN

## 2016-04-23 NOTE — Discharge Instructions (Signed)
Please read and follow all provided instructions.  Your diagnoses today include:  1. Closed nondisplaced fracture of head of right radius, initial encounter   2. Sprain of right wrist, initial encounter    Tests performed today include: Vital signs. See below for your results today.   Medications prescribed:  Take as prescribed   Home care instructions:  Follow any educational materials contained in this packet.  Follow-up instructions: Please follow-up with your primary care provider for further evaluation of symptoms and treatment   Return instructions:  Please return to the Emergency Department if you do not get better, if you get worse, or new symptoms OR  - Fever (temperature greater than 101.48F)  - Bleeding that does not stop with holding pressure to the area    -Severe pain (please note that you may be more sore the day after your accident)  - Chest Pain  - Difficulty breathing  - Severe nausea or vomiting  - Inability to tolerate food and liquids  - Passing out  - Skin becoming red around your wounds  - Change in mental status (confusion or lethargy)  - New numbness or weakness    Please return if you have any other emergent concerns.  Additional Information:  Your vital signs today were: BP 154/62 (BP Location: Left Arm)    Pulse 80    Temp 99.1 F (37.3 C) (Oral)    Resp 20    SpO2 93%  If your blood pressure (BP) was elevated above 135/85 this visit, please have this repeated by your doctor within one month. ---------------

## 2016-04-23 NOTE — ED Triage Notes (Signed)
Pt spouse fell on her on Monday.  She attempted to catch him.  Family landed on her abdomen to rt shoulder.  Pt states since then she has had pain across chest to rt arm and flank.  Worse with movement.  Unable to rotate shoulders d/t pain.  Pt has also been attempting to lift spouse with her shoulders which has increased her pain.

## 2016-04-23 NOTE — ED Provider Notes (Signed)
[facility] DEPT Provider Note   CSN: [id number] Arrival date & time: 04/23/16  1356     History   Chief Complaint Chief Complaint  Patient presents with  . Arm Pain  . Chest Pain    HPI Valerie Ramirez is a 83 y.o. female.  HPI   Valerie Ramirez is a 83 y.o. female, with a history of HTN and osteoarthritis, presenting to the ED with right sided pain, including right wrist, right hand, right hip, and right foot. Pain in the right wrist is the worst, rates it 10/10, aching/throbbing. Pt states she was helping her husband walk from one room to another. She was supporting him from behind. Husband lost his balance, fell backward into her chest, and slid to the floor. This occurred 5 days ago. 4 days ago she was trying to lift her husband out of a chair and she hurt her right hand, wrist, and elbow. Patient states she did have some chest tenderness but has not had that for days. Patient states she has been ambulating since the incident. Denies chest pain, shortness of breath, nausea/vomiting, abdominal pain, neuro deficits, or any other complaints.     Past Medical History:  Diagnosis Date  . Arthritis    OA  . Cancer ([place])    endometrial  . GERD (gastroesophageal reflux disease)   . Hyperlipidemia   . Hypertension   . Hypothyroid   . Kidney stone   . Shortness of breath dyspnea     Patient Active Problem List   Diagnosis Date Noted  . Right foot pain 11/07/2015  . Status post cervical spinal fusion 08/18/2015  . Arm pain 05/16/2015  . Traumatic hematoma of lower leg 11/25/2014  . Abrasion, lower leg, anterior 11/25/2014  . Left hip pain 08/12/2014  . Osteoarthritis, hip, bilateral 08/12/2014  . Hearing loss due to cerumen impaction 06/12/2014  . Hearing loss in right ear 06/12/2014  . Colon cancer screening 08/10/2013  . Seborrheic keratoses, inflamed 08/10/2013  . Excessive cerumen in left ear canal 08/10/2013  . History of fall 04/10/2013  . Encounter for  Medicare annual wellness exam 08/08/2012  . Gout 07/14/2012  . Endometrial cancer ([place]) 05/25/2012  . Degenerative joint disease of cervical spine 05/03/2011  . Other screening mammogram 03/31/2011  . Hyperglycemia 09/26/2007  . Obesity 07/12/2007  . GOITER, MULTINODULAR 07/11/2007  . Essential hypertension 07/11/2007  . CARDIOMYOPATHY 07/11/2007  . OSTEOARTHRITIS 07/11/2007  . 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 Hypothyroidism 10/05/2006  . HYPERCHOLESTEROLEMIA, PURE 10/05/2006    Past Surgical History:  Procedure Laterality Date  . ANTERIOR CERVICAL DECOMP/DISCECTOMY FUSION [name] 08/18/2015   Procedure: C5-6, C6-7 Anterior Cervical Discectomy and Fusion, Allograft, Plate;  Surgeon: Marybelle Killings, MD;  Location: [facility];  Service: Orthopedics;  Laterality: [name];  . bone spur removed Right    shoulder  . BOWEL RESECTION  07/04/2012   Procedure: SMALL BOWEL RESECTION;  Surgeon: Alvino Chapel, MD;  Location: [facility];  Service: Gynecology;;  . CHOLECYSTECTOMY    . DILATION AND CURETTAGE OF UTERUS  1999  . EYE SURGERY Bilateral    cataracts  . HAND SURGERY  12/2008   after hand fx/due to fall  . JOINT REPLACEMENT Bilateral 07/1998   knees  . KNEE ARTHROPLASTY  05/23/1998   total right  . LAPAROTOMY Bilateral 07/04/2012   Procedure: EXPLORATORY LAPAROTOMY TOTAL ABDOMINAL HYSTERECTOMY BILATERAL SALPINGO-OOPHORECTOMY with small bowel resection ;  Surgeon: Alvino Chapel, MD;  Location: [facility];  Service: Gynecology;  Laterality: Bilateral;  . polyp removal  1999  . TUBAL LIGATION      OB History    Gravida Para Term Preterm AB Living   5 2     3 2    SAB TAB Ectopic Multiple Live Births   3               Home Medications    Prior to Admission medications   Medication Sig Start Date End Date Taking? Authorizing Provider  amLODipine (NORVASC) 5 MG tablet Take 1 tablet (5 mg total) by mouth daily. 08/12/14  Yes Abner Greenspan, MD  Aspirin-Caffeine (BAYER  BACK & BODY PO) Take 1 tablet by mouth every 6 (six) hours as needed (back pain).   Yes Historical Provider, MD  colchicine (COLCRYS) 0.6 MG tablet TAKE 1 TABLET (0.6 MG TOTAL) BY MOUTH 2 (TWO) TIMES DAILY. WITH FOOD AS NEEDED FOR GOUT 11/07/15  Yes Abner Greenspan, MD  doxazosin (CARDURA) 8 MG tablet TAKE 1 TABLET (8 MG TOTAL) BY MOUTH AT BEDTIME. 11/07/15  Yes Abner Greenspan, MD  HYDROcodone-acetaminophen (NORCO/VICODIN) 5-325 MG tablet Take 1-2 tablets by mouth every 6 (six) hours as needed (mild pain). 08/19/15  Yes Marybelle Killings, MD  lisinopril (PRINIVIL,ZESTRIL) 5 MG tablet TAKE 1 TABLET (5 MG TOTAL) BY MOUTH DAILY. 11/07/15  Yes Abner Greenspan, MD  metoprolol (LOPRESSOR) 100 MG tablet TAKE 1 TABLET (100 MG TOTAL) BY MOUTH 2 (TWO) TIMES DAILY. 11/07/15  Yes Abner Greenspan, MD  vitamin B-12 (CYANOCOBALAMIN) 1000 MCG tablet Take 1,000 mcg by mouth daily.   Yes Historical Provider, MD  DULoxetine (CYMBALTA) 20 MG capsule TAKE 1 CAPSULE (20 MG TOTAL) BY MOUTH DAILY. Patient not taking: Reported on 04/23/2016 11/27/15   Abner Greenspan, MD    Family History Family History  Problem Relation Age of Onset  . Cancer Brother     LEUKEMIA  . Cancer Brother     LUNG  . Cancer Brother     STOMACH  . Heart disease Sister     CABG  . Stroke Sister   . Heart disease Brother     CABG    Social History Social History  Substance Use Topics  . Smoking status: Never Smoker  . Smokeless tobacco: Never Used  . Alcohol use No     Allergies   Allopurinol; Amoxicillin-pot clavulanate; Lipitor [atorvastatin calcium]; Simvastatin; and Tape   Review of Systems Review of Systems  Respiratory: Negative for cough and shortness of breath.   Cardiovascular: Negative for chest pain.  Gastrointestinal: Negative for abdominal pain, nausea and vomiting.  Musculoskeletal: Positive for arthralgias.  Skin: Negative for wound.  Neurological: Negative for dizziness, weakness, light-headedness, numbness and headaches.  All  other systems reviewed and are negative.    Physical Exam Updated Vital Signs BP 198/76 (BP Location: Left Arm)   Pulse 88   Temp 98 F (36.7 C) (Oral)   Resp 20   SpO2 96%   Physical Exam  Constitutional: She appears well-developed and well-nourished. No distress.  HENT:  Head: Normocephalic and atraumatic.  Eyes: Conjunctivae are normal.  Neck: Normal range of motion. Neck supple.  Cardiovascular: Normal rate, regular rhythm, normal heart sounds and intact distal pulses.   Pulmonary/Chest: Effort normal and breath sounds normal. No respiratory distress. She exhibits no tenderness.  Abdominal: Soft. There is no tenderness. There is no guarding.  Musculoskeletal: She exhibits no edema.  Tenderness to the dorsal  right hand and wrist with some minor swelling noted. ROM limited by pain. Tenderness to the right anterior and posterior hip. Full ROM in the hip.  Tenderness to the right lateral foot without noted deformity or swelling.  Full ROM in the right shoulder. No noted crepitus, swelling, or deformity.   Neurological: She is alert.  No sensory deficits. Strength 5/5 in all extremities.   Skin: Skin is warm and dry. She is not diaphoretic.  Psychiatric: She has a normal mood and affect. Her behavior is normal.  Nursing note and vitals reviewed.    ED Treatments / Results  Labs (all labs ordered are listed, but only abnormal results are displayed) Labs Reviewed - No data to display  EKG  EKG Interpretation None       Radiology No results found.  Procedures Procedures (including critical care time)  Medications Ordered in ED Medications  HYDROcodone-acetaminophen (NORCO/VICODIN) 5-325 MG per tablet 1 tablet (1 tablet Oral Given 04/23/16 1447)     Initial Impression / Assessment and Plan / ED Course  I have reviewed the triage vital signs and the nursing notes.  Pertinent labs & imaging results that were available during my care of the patient were reviewed by  me and considered in my medical decision making (see chart for details).  Clinical Course     Patient presents with right-sided pain for the last 5 days.   Findings and plan of care discussed with Deno Etienne, DO. Dr. Tyrone Nine personally evaluated and examined this patient.  End of shift patient care handoff report given to Shary Decamp, PA-C. Plan: Review imaging, dispo accordingly.  Vitals:   04/23/16 1401  BP: 198/76  Pulse: 88  Resp: 20  Temp: 98 F (36.7 C)  TempSrc: Oral  SpO2: 96%     Final Clinical Impressions(s) / ED Diagnoses   Final diagnoses:  None    New Prescriptions New Prescriptions   No medications on file     Lorayne Bender, PA-C 04/23/16 [place], DO 04/24/16 1034

## 2016-04-23 NOTE — ED Provider Notes (Signed)
  Physical Exam  BP 198/76 (BP Location: Left Arm)   Pulse 88   Temp 98 F (36.7 C) (Oral)   Resp 20   SpO2 96%   Physical Exam  ED Course  Procedures  MDM 4:06 PM- Sign out from [facility], PA-C   Per previous provider HPI: Valerie Ramirez is a 83 y.o. female, with a history of HTN and osteoarthritis, presenting to the ED with right sided pain, including right wrist, right hand, right hip, and right foot. Pain in the right wrist is the worst, rates it 10/10, aching/throbbing. Pt states she was helping her husband walk from one room to another. She was supporting him from behind. Husband lost his balance, fell backward into her chest, and slid to the floor. This occurred 5 days ago. 4 days ago she was trying to lift her husband out of a chair and she hurt her right hand, wrist, and elbow. Patient states she did have some chest tenderness but has not had that for days. Patient states she has been ambulating since the incident. Denies chest pain, shortness of breath, nausea/vomiting, abdominal pain, neuro deficits, or any other complaints.  Plan: Pending Imaging  4:43 PM- Imaging unremarkable except for possible subtle nondisplaced right radial head fracture. Placed in sling. Given Rx Vicodin #10. Counseled on ice and elevation. Follow up to PCP.    At time of discharge, Patient is in no acute distress. Vital Signs are stable. Patient is able to ambulate. Patient able to tolerate PO.              Shary Decamp, PA-C 04/23/16 [place], DO 04/24/16 1032

## 2016-04-23 NOTE — ED Notes (Signed)
Pt resting with daughter at bedside.  States pain med did not help her pain.

## 2016-04-25 ENCOUNTER — Emergency Department (HOSPITAL_COMMUNITY): Payer: Medicare Other

## 2016-04-25 ENCOUNTER — Inpatient Hospital Stay (HOSPITAL_COMMUNITY)
Admission: EM | Admit: 2016-04-25 | Discharge: 2016-04-29 | DRG: 682 | Disposition: A | Discharge status: Skilled Nursing Facility | Payer: Medicare Other | Attending: Internal Medicine | Admitting: Internal Medicine

## 2016-04-25 ENCOUNTER — Encounter (HOSPITAL_COMMUNITY): Payer: Self-pay

## 2016-04-25 DIAGNOSIS — Z8542 Personal history of malignant neoplasm of other parts of uterus: Secondary | ICD-10-CM | POA: Diagnosis not present

## 2016-04-25 DIAGNOSIS — Z87442 Personal history of urinary calculi: Secondary | ICD-10-CM | POA: Diagnosis not present

## 2016-04-25 DIAGNOSIS — R197 Diarrhea, unspecified: Secondary | ICD-10-CM | POA: Diagnosis present

## 2016-04-25 DIAGNOSIS — E039 Hypothyroidism, unspecified: Secondary | ICD-10-CM | POA: Diagnosis present

## 2016-04-25 DIAGNOSIS — Z8249 Family history of ischemic heart disease and other diseases of the circulatory system: Secondary | ICD-10-CM | POA: Diagnosis not present

## 2016-04-25 DIAGNOSIS — N390 Urinary tract infection, site not specified: Secondary | ICD-10-CM | POA: Diagnosis present

## 2016-04-25 DIAGNOSIS — N179 Acute kidney failure, unspecified: Principal | ICD-10-CM | POA: Diagnosis present

## 2016-04-25 DIAGNOSIS — Z6841 Body Mass Index (BMI) 40.0 and over, adult: Secondary | ICD-10-CM | POA: Diagnosis not present

## 2016-04-25 DIAGNOSIS — B9689 Other specified bacterial agents as the cause of diseases classified elsewhere: Secondary | ICD-10-CM | POA: Diagnosis present

## 2016-04-25 DIAGNOSIS — B961 Klebsiella pneumoniae [K. pneumoniae] as the cause of diseases classified elsewhere: Secondary | ICD-10-CM | POA: Diagnosis present

## 2016-04-25 DIAGNOSIS — I10 Essential (primary) hypertension: Secondary | ICD-10-CM | POA: Diagnosis present

## 2016-04-25 DIAGNOSIS — M7989 Other specified soft tissue disorders: Secondary | ICD-10-CM | POA: Diagnosis not present

## 2016-04-25 DIAGNOSIS — I429 Cardiomyopathy, unspecified: Secondary | ICD-10-CM | POA: Diagnosis present

## 2016-04-25 DIAGNOSIS — Z9181 History of falling: Secondary | ICD-10-CM | POA: Diagnosis not present

## 2016-04-25 DIAGNOSIS — E669 Obesity, unspecified: Secondary | ICD-10-CM | POA: Diagnosis present

## 2016-04-25 DIAGNOSIS — R2681 Unsteadiness on feet: Secondary | ICD-10-CM | POA: Diagnosis not present

## 2016-04-25 DIAGNOSIS — Z806 Family history of leukemia: Secondary | ICD-10-CM | POA: Diagnosis not present

## 2016-04-25 DIAGNOSIS — D72829 Elevated white blood cell count, unspecified: Secondary | ICD-10-CM | POA: Diagnosis present

## 2016-04-25 DIAGNOSIS — Z79899 Other long term (current) drug therapy: Secondary | ICD-10-CM | POA: Diagnosis not present

## 2016-04-25 DIAGNOSIS — Z823 Family history of stroke: Secondary | ICD-10-CM | POA: Diagnosis not present

## 2016-04-25 DIAGNOSIS — E871 Hypo-osmolality and hyponatremia: Secondary | ICD-10-CM | POA: Diagnosis present

## 2016-04-25 DIAGNOSIS — E785 Hyperlipidemia, unspecified: Secondary | ICD-10-CM | POA: Diagnosis present

## 2016-04-25 DIAGNOSIS — Z981 Arthrodesis status: Secondary | ICD-10-CM

## 2016-04-25 DIAGNOSIS — M25462 Effusion, left knee: Secondary | ICD-10-CM | POA: Diagnosis not present

## 2016-04-25 DIAGNOSIS — E860 Dehydration: Secondary | ICD-10-CM | POA: Diagnosis not present

## 2016-04-25 DIAGNOSIS — R600 Localized edema: Secondary | ICD-10-CM | POA: Diagnosis present

## 2016-04-25 DIAGNOSIS — K219 Gastro-esophageal reflux disease without esophagitis: Secondary | ICD-10-CM | POA: Diagnosis present

## 2016-04-25 DIAGNOSIS — M79605 Pain in left leg: Secondary | ICD-10-CM | POA: Diagnosis not present

## 2016-04-25 DIAGNOSIS — N3000 Acute cystitis without hematuria: Secondary | ICD-10-CM

## 2016-04-25 DIAGNOSIS — M25552 Pain in left hip: Secondary | ICD-10-CM | POA: Diagnosis present

## 2016-04-25 DIAGNOSIS — R531 Weakness: Secondary | ICD-10-CM | POA: Diagnosis not present

## 2016-04-25 DIAGNOSIS — M25562 Pain in left knee: Secondary | ICD-10-CM

## 2016-04-25 DIAGNOSIS — M6281 Muscle weakness (generalized): Secondary | ICD-10-CM | POA: Diagnosis not present

## 2016-04-25 DIAGNOSIS — D72825 Bandemia: Secondary | ICD-10-CM

## 2016-04-25 DIAGNOSIS — G9340 Encephalopathy, unspecified: Secondary | ICD-10-CM | POA: Diagnosis not present

## 2016-04-25 DIAGNOSIS — R279 Unspecified lack of coordination: Secondary | ICD-10-CM | POA: Diagnosis not present

## 2016-04-25 DIAGNOSIS — R41841 Cognitive communication deficit: Secondary | ICD-10-CM | POA: Diagnosis not present

## 2016-04-25 DIAGNOSIS — Z96652 Presence of left artificial knee joint: Secondary | ICD-10-CM | POA: Diagnosis present

## 2016-04-25 DIAGNOSIS — R259 Unspecified abnormal involuntary movements: Secondary | ICD-10-CM | POA: Diagnosis not present

## 2016-04-25 LAB — CBC WITH DIFFERENTIAL/PLATELET
Basophils Absolute: 0 10*3/uL (ref 0.0–0.1)
Basophils Relative: 0 %
Eosinophils Absolute: 0.1 10*3/uL (ref 0.0–0.7)
Eosinophils Relative: 1 %
HCT: 30.5 % — ABNORMAL LOW (ref 36.0–46.0)
Hemoglobin: 9.7 g/dL — ABNORMAL LOW (ref 12.0–15.0)
Lymphocytes Relative: 10 %
Lymphs Abs: 1.5 10*3/uL (ref 0.7–4.0)
MCH: 26.5 pg (ref 26.0–34.0)
MCHC: 31.8 g/dL (ref 30.0–36.0)
MCV: 83.3 fL (ref 78.0–100.0)
Monocytes Absolute: 1.5 10*3/uL — ABNORMAL HIGH (ref 0.1–1.0)
Monocytes Relative: 10 %
Neutro Abs: 12.1 10*3/uL — ABNORMAL HIGH (ref 1.7–7.7)
Neutrophils Relative %: 79 %
Platelets: 227 10*3/uL (ref 150–400)
RBC: 3.66 MIL/uL — ABNORMAL LOW (ref 3.87–5.11)
RDW: 17.3 % — ABNORMAL HIGH (ref 11.5–15.5)
WBC: 15.2 10*3/uL — ABNORMAL HIGH (ref 4.0–10.5)

## 2016-04-25 LAB — COMPREHENSIVE METABOLIC PANEL
ALT: 27 U/L (ref 14–54)
AST: 27 U/L (ref 15–41)
Albumin: 3.2 g/dL — ABNORMAL LOW (ref 3.5–5.0)
Alkaline Phosphatase: 111 U/L (ref 38–126)
Anion gap: 11 (ref 5–15)
BUN: 51 mg/dL — ABNORMAL HIGH (ref 6–20)
CO2: 21 mmol/L — ABNORMAL LOW (ref 22–32)
Calcium: 8.4 mg/dL — ABNORMAL LOW (ref 8.9–10.3)
Chloride: 100 mmol/L — ABNORMAL LOW (ref 101–111)
Creatinine, Ser: 1.93 mg/dL — ABNORMAL HIGH (ref 0.44–1.00)
GFR calc Af Amer: 27 mL/min — ABNORMAL LOW (ref >60)
GFR calc non Af Amer: 23 mL/min — ABNORMAL LOW (ref >60)
Glucose, Bld: 103 mg/dL — ABNORMAL HIGH (ref 65–99)
Potassium: 3.7 mmol/L (ref 3.5–5.1)
Sodium: 132 mmol/L — ABNORMAL LOW (ref 135–145)
Total Bilirubin: 1 mg/dL (ref 0.3–1.2)
Total Protein: 6.8 g/dL (ref 6.5–8.1)

## 2016-04-25 LAB — URINALYSIS, MICROSCOPIC (REFLEX)

## 2016-04-25 LAB — URINALYSIS, ROUTINE W REFLEX MICROSCOPIC
Glucose, UA: NEGATIVE mg/dL
Hgb urine dipstick: NEGATIVE
Ketones, ur: NEGATIVE mg/dL
Leukocytes, UA: NEGATIVE
Nitrite: NEGATIVE
Protein, ur: 100 mg/dL — AB
Specific Gravity, Urine: 1.025 (ref 1.005–1.030)
pH: 5 (ref 5.0–8.0)

## 2016-04-25 MED ORDER — MORPHINE SULFATE (PF) 2 MG/ML IV SOLN
2.0000 mg | INTRAVENOUS | Status: DC | PRN
  Administered 2016-04-25: 2 mg via INTRAVENOUS
  Filled 2016-04-25 (×2): qty 1

## 2016-04-25 MED ORDER — ONDANSETRON HCL 4 MG/2ML IJ SOLN
4.0000 mg | Freq: Once | INTRAMUSCULAR | Status: AC
  Administered 2016-04-25: 4 mg via INTRAVENOUS
  Filled 2016-04-25: qty 2

## 2016-04-25 MED ORDER — CEFTRIAXONE SODIUM 1 G IJ SOLR
1.0000 g | Freq: Once | INTRAMUSCULAR | Status: AC
  Administered 2016-04-25: 1 g via INTRAVENOUS
  Filled 2016-04-25: qty 10

## 2016-04-25 MED ORDER — OXYCODONE-ACETAMINOPHEN 5-325 MG PO TABS: 1.0000 | ORAL_TABLET | Freq: Once | ORAL | Status: DC

## 2016-04-25 NOTE — ED Notes (Signed)
PT UNABLE TO BEAR WEIGHT ONTO THE LEFT LEG. AMBULATION UNSUCCESSFUL.

## 2016-04-25 NOTE — ED Provider Notes (Signed)
[facility] DEPT Provider Note   CSN: [id number] Arrival date & time: 04/25/16  1318     History   Chief Complaint Chief Complaint  Patient presents with  . Dysuria  . Knee Pain    HPI Valerie Ramirez is a 83 y.o. female.  Patient presents with continued left lower extremity pain after a fall occurring 2 days ago. Patient was seen in emergency department after the fall and had imaging performed of the right elbow and wrist, right foot and hand. She was diagnosed with a nondisplaced right radial head fracture. She was placed in a wrist splint. However, patient has been unable to walk at home per her baseline without significant assistance. She complains of pain from her left knee to her foot. She is unable to bend her left knee and can bear very little weight. No reported left hip pain.  Daughter had also noted onset of "orange-brown" urine and patient complains of lower back pain. No vomiting or fever. No change in mental status.      Past Medical History:  Diagnosis Date  . Arthritis    OA  . Cancer ([place])    endometrial  . GERD (gastroesophageal reflux disease)   . Hyperlipidemia   . Hypertension   . Hypothyroid   . Kidney stone   . Shortness of breath dyspnea     Patient Active Problem List   Diagnosis Date Noted  . Right foot pain 11/07/2015  . Status post cervical spinal fusion 08/18/2015  . Arm pain 05/16/2015  . Traumatic hematoma of lower leg 11/25/2014  . Abrasion, lower leg, anterior 11/25/2014  . Left hip pain 08/12/2014  . Osteoarthritis, hip, bilateral 08/12/2014  . Hearing loss due to cerumen impaction 06/12/2014  . Hearing loss in right ear 06/12/2014  . Colon cancer screening 08/10/2013  . Seborrheic keratoses, inflamed 08/10/2013  . Excessive cerumen in left ear canal 08/10/2013  . History of fall 04/10/2013  . Encounter for Medicare annual wellness exam 08/08/2012  . Gout 07/14/2012  . Endometrial cancer ([place]) 05/25/2012  . Degenerative  joint disease of cervical spine 05/03/2011  . Other screening mammogram 03/31/2011  . Hyperglycemia 09/26/2007  . Obesity 07/12/2007  . GOITER, MULTINODULAR 07/11/2007  . Essential hypertension 07/11/2007  . CARDIOMYOPATHY 07/11/2007  . OSTEOARTHRITIS 07/11/2007  . 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 Hypothyroidism 10/05/2006  . HYPERCHOLESTEROLEMIA, PURE 10/05/2006    Past Surgical History:  Procedure Laterality Date  . ANTERIOR CERVICAL DECOMP/DISCECTOMY FUSION [name] 08/18/2015   Procedure: C5-6, C6-7 Anterior Cervical Discectomy and Fusion, Allograft, Plate;  Surgeon: Marybelle Killings, MD;  Location: [facility];  Service: Orthopedics;  Laterality: [name];  . bone spur removed Right    shoulder  . BOWEL RESECTION  07/04/2012   Procedure: SMALL BOWEL RESECTION;  Surgeon: Alvino Chapel, MD;  Location: [facility];  Service: Gynecology;;  . CHOLECYSTECTOMY    . DILATION AND CURETTAGE OF UTERUS  1999  . EYE SURGERY Bilateral    cataracts  . HAND SURGERY  12/2008   after hand fx/due to fall  . JOINT REPLACEMENT Bilateral 07/1998   knees  . KNEE ARTHROPLASTY  05/23/1998   total right  . LAPAROTOMY Bilateral 07/04/2012   Procedure: EXPLORATORY LAPAROTOMY TOTAL ABDOMINAL HYSTERECTOMY BILATERAL SALPINGO-OOPHORECTOMY with small bowel resection ;  Surgeon: Alvino Chapel, MD;  Location: [facility];  Service: Gynecology;  Laterality: Bilateral;  . polyp removal  1999  . TUBAL LIGATION      OB  History    Gravida Para Term Preterm AB Living   5 2     3 2    SAB TAB Ectopic Multiple Live Births   3               Home Medications    Prior to Admission medications   Medication Sig Start Date End Date Taking? Authorizing Provider  amLODipine (NORVASC) 5 MG tablet Take 1 tablet (5 mg total) by mouth daily. 08/12/14   Abner Greenspan, MD  Aspirin-Caffeine (BAYER BACK & BODY PO) Take 1 tablet by mouth every 6 (six) hours as needed (back pain).    Historical Provider, MD  colchicine  (COLCRYS) 0.6 MG tablet TAKE 1 TABLET (0.6 MG TOTAL) BY MOUTH 2 (TWO) TIMES DAILY. WITH FOOD AS NEEDED FOR GOUT 11/07/15   Abner Greenspan, MD  doxazosin (CARDURA) 8 MG tablet TAKE 1 TABLET (8 MG TOTAL) BY MOUTH AT BEDTIME. 11/07/15   Abner Greenspan, MD  DULoxetine (CYMBALTA) 20 MG capsule TAKE 1 CAPSULE (20 MG TOTAL) BY MOUTH DAILY. Patient not taking: Reported on 04/23/2016 11/27/15   Abner Greenspan, MD  HYDROcodone-acetaminophen (NORCO/VICODIN) 5-325 MG tablet Take 1 tablet by mouth every 6 (six) hours as needed. 04/23/16   Shary Decamp, PA-C  lisinopril (PRINIVIL,ZESTRIL) 5 MG tablet TAKE 1 TABLET (5 MG TOTAL) BY MOUTH DAILY. 11/07/15   Abner Greenspan, MD  metoprolol (LOPRESSOR) 100 MG tablet TAKE 1 TABLET (100 MG TOTAL) BY MOUTH 2 (TWO) TIMES DAILY. 11/07/15   Abner Greenspan, MD  vitamin B-12 (CYANOCOBALAMIN) 1000 MCG tablet Take 1,000 mcg by mouth daily.    Historical Provider, MD    Family History Family History  Problem Relation Age of Onset  . Cancer Brother     LEUKEMIA  . Cancer Brother     LUNG  . Cancer Brother     STOMACH  . Heart disease Sister     CABG  . Stroke Sister   . Heart disease Brother     CABG    Social History Social History  Substance Use Topics  . Smoking status: Never Smoker  . Smokeless tobacco: Never Used  . Alcohol use No     Allergies   Allopurinol; Amoxicillin-pot clavulanate; Lipitor [atorvastatin calcium]; Simvastatin; and Tape   Review of Systems Review of Systems  Constitutional: Negative for activity change and fever.  HENT: Negative for rhinorrhea and sore throat.   Eyes: Negative for redness.  Respiratory: Negative for cough.   Cardiovascular: Negative for chest pain.  Gastrointestinal: Negative for abdominal pain, diarrhea, nausea and vomiting.  Genitourinary: Negative for dysuria and hematuria.       + urine cloudy and odorous  Musculoskeletal: Positive for arthralgias, back pain and gait problem. Negative for joint swelling, myalgias and  neck pain.  Skin: Negative for rash and wound.  Neurological: Negative for weakness, numbness and headaches.     Physical Exam Updated Vital Signs BP 179/85 (BP Location: Right Arm)   Pulse 77   Temp 97.3 F (36.3 C) (Oral)   Resp 20   SpO2 100%   Physical Exam  Constitutional: She appears well-developed and well-nourished.  HENT:  Head: Normocephalic and atraumatic.  Eyes: Conjunctivae are normal. Right eye exhibits no discharge. Left eye exhibits no discharge.  Neck: Normal range of motion. Neck supple.  Cardiovascular: Normal rate, regular rhythm and normal heart sounds.   Pulmonary/Chest: Effort normal and breath sounds normal.  Abdominal: Soft. There is no tenderness.  Musculoskeletal: She exhibits tenderness. She exhibits no edema.       Right elbow: Tenderness found. Radial head tenderness noted.       Right wrist: She exhibits decreased range of motion.       Right hip: Normal.       Left hip: She exhibits decreased range of motion. She exhibits normal strength and no tenderness.       Right knee: She exhibits normal range of motion and no swelling. Tenderness (mild) found.       Left knee: She exhibits decreased range of motion. She exhibits no swelling and no effusion. Tenderness found. Lateral joint line tenderness noted.       Right ankle: Normal. No tenderness. No lateral malleolus and no medial malleolus tenderness found.       Left ankle: She exhibits normal range of motion, no swelling and no ecchymosis. Tenderness (generalized). No lateral malleolus and no medial malleolus tenderness found.       Arms:      Right upper leg: Normal.       Left upper leg: Normal.       Right lower leg: Normal.       Left lower leg: Normal.       Right foot: Normal.       Left foot: There is tenderness.  Neurological: She is alert.  Skin: Skin is warm and dry.  Psychiatric: She has a normal mood and affect.  Nursing note and vitals reviewed.    ED Treatments / Results    Labs (all labs ordered are listed, but only abnormal results are displayed) Labs Reviewed  URINALYSIS, ROUTINE W REFLEX MICROSCOPIC - Abnormal; Notable for the following:       Result Value   Color, Urine AMBER (*)    APPearance HAZY (*)    Bilirubin Urine SMALL (*)    Protein, ur 100 (*)    All other components within normal limits  URINALYSIS, MICROSCOPIC (REFLEX) - Abnormal; Notable for the following:    Bacteria, UA MANY (*)    Squamous Epithelial / LPF 0-5 (*)    All other components within normal limits  COMPREHENSIVE METABOLIC PANEL - Abnormal; Notable for the following:    Sodium 132 (*)    Chloride 100 (*)    CO2 21 (*)    Glucose, Bld 103 (*)    BUN 51 (*)    Creatinine, Ser 1.93 (*)    Calcium 8.4 (*)    Albumin 3.2 (*)    GFR calc non Af Amer 23 (*)    GFR calc Af Amer 27 (*)    All other components within normal limits  CBC WITH DIFFERENTIAL/PLATELET - Abnormal; Notable for the following:    WBC 15.2 (*)    RBC 3.66 (*)    Hemoglobin 9.7 (*)    HCT 30.5 (*)    RDW 17.3 (*)    Neutro Abs 12.1 (*)    Monocytes Absolute 1.5 (*)    All other components within normal limits  URINE CULTURE  SODIUM, URINE, RANDOM  CREATININE, URINE, RANDOM  OSMOLALITY, URINE     Radiology Dg Knee Complete 4 Views Left  Result Date: 04/25/2016 CLINICAL DATA:  Pain along the anteromedial left knee after trauma 2 days ago. EXAM: LEFT KNEE - COMPLETE 4+ VIEW COMPARISON:  None. FINDINGS: The patient is status post left knee replacement. No evidence of hardware failure. There is a small joint effusion. No acute fractures identified. IMPRESSION:  Small joint effusion after trauma. No identified fracture. No evidence of hardware failure. Electronically Signed   By: Dorise Bullion III M.D   On: 04/25/2016 14:12    Procedures Procedures (including critical care time)  Medications Ordered in ED Medications  morphine 2 MG/ML injection 2 mg (2 mg Intravenous Given 04/25/16 2147)   oxyCODONE-acetaminophen (PERCOCET/ROXICET) 5-325 MG per tablet 1 tablet (0 tablets Oral Hold 04/25/16 2145)  ondansetron (ZOFRAN) injection 4 mg (4 mg Intravenous Given 04/25/16 2148)  cefTRIAXone (ROCEPHIN) 1 g in dextrose 5 % 50 mL IVPB (0 g Intravenous Stopped 04/25/16 2248)     Initial Impression / Assessment and Plan / ED Course  I have reviewed the triage vital signs and the nursing notes.  Pertinent labs & imaging results that were available during my care of the patient were reviewed by me and considered in my medical decision making (see chart for details).  Clinical Course    Patient seen and examined. X-ray neg, but given significant discomfort -- will CT knee and x-ray hip to ensure no referred pain.   Vital signs reviewed and are as follows: BP 179/85 (BP Location: Right Arm)   Pulse 77   Temp 97.3 F (36.3 C) (Oral)   Resp 20   SpO2 100%   Attempted to place Angiocath and draw blood by myself unsuccessful. IV team has seen patient and started IV right antecubital area.  Labs reviewed as above. Patient has urinary tract infection for which she has received Rocephin. Pain is better controlled after administration of IV morphine. She has not been able to ambulate well in the emergency department due to pain and inability to bear weight on left leg.  I spoken to Dr. Roel Cluck who will see patient and admit.  Final Clinical Impressions(s) / ED Diagnoses   Final diagnoses:  Lower urinary tract infectious disease  Acute kidney injury ([place])  Left leg pain   Admit.  New Prescriptions New Prescriptions   No medications on file     Carlisle Cater, PA-C 04/25/16 [place], MD 04/26/16 [phone]

## 2016-04-25 NOTE — ED Notes (Signed)
Writer came into ambulate pt, pt stated that she would like to have pain medication before attempting to ambulate.

## 2016-04-25 NOTE — ED Triage Notes (Signed)
Pt had family member fall on her 2 days ago. Pt has fx elbow.  Pt now with discolored urine.  Pt c/o back pain and left knee.

## 2016-04-25 NOTE — H&P (Signed)
Valerie Ramirez [id number] DOB: November 21, 1932 DOA: 04/25/2016     PCP: Valerie Pardon, MD   Outpatient Specialists: [facility]  Patient coming from:    home Lives  With family   Chief Complaint: Left knee pain   HPI: Valerie Ramirez is a 83 y.o. female with medical history significant of HTN, cardiomyopathy  Hypothyroidism arthritis, GERD, history of endometrial   cancer  Presented with severe right shoulder and left knee pain after patient had sustained a fall 1 week ago her husband fell on top of her and topled her down she was attempted to catch his fall. She landed on her abdomen and right shoulder she has had significant chest pain, right wrist pain after she attempted to lift her husband out of chair and left knee pain associated with trouble ambulating secondary to diffuse pain in for this in emergency department 2 days ago. He was found to have a radial head fracture and was placed in  splint with follow-up with orthopedics. Chest tenderness have not improved she had not had any shortness of breath no nausea vomiting she had had some foul-smelling urine with Orange collar not associated any fevers or chills  Examination now mainly concerned that she is unable to bear any weight on her left knee.  Family states she has been in a lot of pain she has been drinking water but no food.   Regarding pertinent Chronic problems: Of note patient is status post left knee replacement In May patient had neck fusion reports decreased use of her arms at baseline  IN ER:  Temp (24hrs), Avg:97.7 F (36.5 C), Min:97.3 F (36.3 C), Max:98.1 F (36.7 C)      RR 20 satting 92% HR 81 BP 164/62 Sodium 132 which is down from baseline creatinine 1.93 which is up from baseline 1.2 WBC 15.2 hemoglobin 9.7 which is close to baseline She was found to have UTI Extensive imaging including hip and pelvis plain films that showed no fracture  CT left knee- no fracture  Following Medications were ordered  in ER: Medications  morphine 2 MG/ML injection 2 mg (2 mg Intravenous Given 04/25/16 2147)  oxyCODONE-acetaminophen (PERCOCET/ROXICET) 5-325 MG per tablet 1 tablet (0 tablets Oral Hold 04/25/16 2145)  ondansetron (ZOFRAN) injection 4 mg (4 mg Intravenous Given 04/25/16 2148)  cefTRIAXone (ROCEPHIN) 1 g in dextrose 5 % 50 mL IVPB (0 g Intravenous Stopped 04/25/16 2248)      Hospitalist was called for admission for Dehydration resulting in AKI, UTI and pain control  Review of Systems:    Pertinent positives include: fatigue, decreased by mouth intake, loss of appetite,   change in color of urine Constitutional:  No weight loss, night sweats, Fevers, chills,  weight loss  HEENT:  No headaches, Difficulty swallowing,Tooth/dental problems,Sore throat,  No sneezing, itching, ear ache, nasal congestion, post nasal drip,  Cardio-vascular:  No chest pain, Orthopnea, PND, anasarca, dizziness, palpitations.no Bilateral lower extremity swelling  GI:  No heartburn, indigestion, abdominal pain, nausea, vomiting, diarrhea, change in bowel habits, melena, blood in stool, hematemesis Resp:  no shortness of breath at rest. No dyspnea on exertion, No excess mucus, no productive cough, No non-productive cough, No coughing up of blood.No change in color of mucus.No wheezing. Skin:  no rash or lesions. No jaundice GU:  no dysuria,, no urgency or frequency. No straining to urinate.  No flank pain.  Musculoskeletal:  No joint pain or no joint swelling. No decreased range of motion. No back  pain.  Psych:  No change in mood or affect. No depression or anxiety. No memory loss.  Neuro: no localizing neurological complaints, no tingling, no weakness, no double vision, no gait abnormality, no slurred speech, no confusion  As per HPI otherwise 10 point review of systems negative.   Past Medical History: Past Medical History:  Diagnosis Date  . Arthritis    OA  . Cancer ([place])    endometrial  . GERD  (gastroesophageal reflux disease)   . Hyperlipidemia   . Hypertension   . Hypothyroid   . Kidney stone   . Shortness of breath dyspnea    Past Surgical History:  Procedure Laterality Date  . ANTERIOR CERVICAL DECOMP/DISCECTOMY FUSION [name] 08/18/2015   Procedure: C5-6, C6-7 Anterior Cervical Discectomy and Fusion, Allograft, Plate;  Surgeon: Valerie Killings, MD;  Location: [facility];  Service: Orthopedics;  Laterality: [name];  . bone spur removed Right    shoulder  . BOWEL RESECTION  07/04/2012   Procedure: SMALL BOWEL RESECTION;  Surgeon: Valerie Chapel, MD;  Location: [facility];  Service: Gynecology;;  . CHOLECYSTECTOMY    . DILATION AND CURETTAGE OF UTERUS  1999  . EYE SURGERY Bilateral    cataracts  . HAND SURGERY  12/2008   after hand fx/due to fall  . JOINT REPLACEMENT Bilateral 07/1998   knees  . KNEE ARTHROPLASTY  05/23/1998   total right  . LAPAROTOMY Bilateral 07/04/2012   Procedure: EXPLORATORY LAPAROTOMY TOTAL ABDOMINAL HYSTERECTOMY BILATERAL SALPINGO-OOPHORECTOMY with small bowel resection ;  Surgeon: Valerie Chapel, MD;  Location: [facility];  Service: Gynecology;  Laterality: Bilateral;  . polyp removal  1999  . TUBAL LIGATION       Social History:  Ambulatory   independently Currently unable to do so    reports that she has never smoked. She has never used smokeless tobacco. She reports that she does not drink alcohol or use drugs.  Allergies:   Allergies  Allergen Reactions  . Allopurinol Nausea Only  . Amoxicillin-Pot Clavulanate     REACTION: stomach upset  . Lipitor [Atorvastatin Calcium] Other (See Comments)    Leg ache and ache all over  . Simvastatin     REACTION: muscle pain  . Tape Itching and Rash    "Cannot use paper tape" --- also causes blisters. Use plastic tape       Family History:   Family History  Problem Relation Age of Onset  . Cancer Brother     LEUKEMIA  . Cancer Brother     LUNG  . Cancer Brother     STOMACH  . Heart  disease Sister     CABG  . Stroke Sister   . Heart disease Brother     CABG    Medications: Prior to Admission medications   Medication Sig Start Date End Date Taking? Authorizing Provider  amLODipine (NORVASC) 5 MG tablet Take 1 tablet (5 mg total) by mouth daily. 08/12/14   Valerie Greenspan, MD  Aspirin-Caffeine (BAYER BACK & BODY PO) Take 1 tablet by mouth every 6 (six) hours as needed (back pain).    Historical Provider, MD  colchicine (COLCRYS) 0.6 MG tablet TAKE 1 TABLET (0.6 MG TOTAL) BY MOUTH 2 (TWO) TIMES DAILY. WITH FOOD AS NEEDED FOR GOUT 11/07/15   Valerie Greenspan, MD  doxazosin (CARDURA) 8 MG tablet TAKE 1 TABLET (8 MG TOTAL) BY MOUTH AT BEDTIME. 11/07/15   Valerie Greenspan, MD  DULoxetine (CYMBALTA) 20 MG  capsule TAKE 1 CAPSULE (20 MG TOTAL) BY MOUTH DAILY. Patient not taking: Reported on 04/23/2016 11/27/15   Valerie Greenspan, MD  HYDROcodone-acetaminophen (NORCO/VICODIN) 5-325 MG tablet Take 1 tablet by mouth every 6 (six) hours as needed. 04/23/16   Shary Decamp, PA-C  lisinopril (PRINIVIL,ZESTRIL) 5 MG tablet TAKE 1 TABLET (5 MG TOTAL) BY MOUTH DAILY. 11/07/15   Valerie Greenspan, MD  metoprolol (LOPRESSOR) 100 MG tablet TAKE 1 TABLET (100 MG TOTAL) BY MOUTH 2 (TWO) TIMES DAILY. 11/07/15   Valerie Greenspan, MD  vitamin B-12 (CYANOCOBALAMIN) 1000 MCG tablet Take 1,000 mcg by mouth daily.    Historical Provider, MD    Physical Exam: Patient Vitals for the past 24 hrs:  BP Temp Temp src Pulse Resp SpO2 Height Weight  04/25/16 2225 164/62 - - 81 20 92 % - -  04/25/16 1923 159/65 98.1 F (36.7 C) Oral 73 18 97 % 5\' 2"  (1.575 m) 104.3 kg (230 lb)  04/25/16 1715 179/85 - - 77 20 100 % - -  04/25/16 1340 - 97.3 F (36.3 C) Oral - - - - -  04/25/16 1330 149/61 - - 65 20 99 % - -    1. General:  in No Acute distress 2. Psychological: Alert and  Oriented 3. Head/ENT:  Dry Mucous Membranes                          Head Non traumatic, neck supple                            Poor Dentition 4. SKIN:    decreased Skin turgor,  Skin clean Dry and intact no rash 5. Heart: Regular rate and rhythm no  Murmur, Rub or gallop 6. Lungs:  Clear to auscultation bilaterally, no wheezes or crackles   7. Abdomen: Soft,   non-tender, Non distended 8. Lower extremities: no clubbing, cyanosis, or edema 9. Neurologically Grossly intact, moving all 4 extremities equally   10. MSK: Normal range of motion   body mass index is 42.07 kg/m.  Labs on Admission:   Labs on Admission: I have personally reviewed following labs and imaging studies  CBC:  Recent Labs Lab 04/25/16 2143  WBC 15.2*  NEUTROABS 12.1*  HGB 9.7*  HCT 30.5*  MCV 83.3  PLT [id number]   Basic Metabolic Panel:  Recent Labs Lab 04/25/16 2143  NA 132*  K 3.7  CL 100*  CO2 21*  GLUCOSE 103*  BUN 51*  CREATININE 1.93*  CALCIUM 8.4*   GFR: Estimated Creatinine Clearance: 25.9 mL/min (by C-G formula based on SCr of 1.93 mg/dL (H)). Liver Function Tests:  Recent Labs Lab 04/25/16 2143  AST 27  ALT 27  ALKPHOS 111  BILITOT 1.0  PROT 6.8  ALBUMIN 3.2*   No results for input(s): LIPASE, AMYLASE in the last 168 hours. No results for input(s): AMMONIA in the last 168 hours. Coagulation Profile: No results for input(s): INR, PROTIME in the last 168 hours. Cardiac Enzymes: No results for input(s): CKTOTAL, CKMB, CKMBINDEX, TROPONINI in the last 168 hours. BNP (last 3 results) No results for input(s): PROBNP in the last 8760 hours. HbA1C: No results for input(s): HGBA1C in the last 72 hours. CBG: No results for input(s): GLUCAP in the last 168 hours. Lipid Profile: No results for input(s): CHOL, HDL, LDLCALC, TRIG, CHOLHDL, LDLDIRECT in the last 72 hours. Thyroid Function Tests: No  results for input(s): TSH, T4TOTAL, FREET4, T3FREE, THYROIDAB in the last 72 hours. Anemia Panel: No results for input(s): VITAMINB12, FOLATE, FERRITIN, TIBC, IRON, RETICCTPCT in the last 72 hours. Urine analysis:    Component Value  Date/Time   COLORURINE AMBER (A) 04/25/2016 1704   APPEARANCEUR HAZY (A) 04/25/2016 1704   LABSPEC 1.025 04/25/2016 1704   PHURINE 5.0 04/25/2016 1704   GLUCOSEU NEGATIVE 04/25/2016 1704   HGBUR NEGATIVE 04/25/2016 1704   BILIRUBINUR SMALL (A) 04/25/2016 1704   KETONESUR NEGATIVE 04/25/2016 1704   PROTEINUR 100 (A) 04/25/2016 1704   UROBILINOGEN 1.0 06/20/2014 1030   NITRITE NEGATIVE 04/25/2016 1704   LEUKOCYTESUR NEGATIVE 04/25/2016 1704   Sepsis Labs: @LABRCNTIP (procalcitonin:4,lacticidven:4) )No results found for this or any previous visit (from the past 240 hour(s)).     UA   evidence of UTI     Lab Results  Component Value Date   HGBA1C 6.4 10/31/2015    Estimated Creatinine Clearance: 25.9 mL/min (by C-G formula based on SCr of 1.93 mg/dL (H)).  BNP (last 3 results) No results for input(s): PROBNP in the last 8760 hours.   ECG REPORT Not ordered  [facility] Weights   04/25/16 1923  Weight: 104.3 kg (230 lb)     Cultures:    Component Value Date/Time   SDES URINE, CLEAN CATCH 06/20/2014 1030   SPECREQUEST NONE 06/20/2014 1030   CULT  06/20/2014 1030    Multiple bacterial morphotypes present, none predominant. Suggest appropriate recollection if clinically indicated. Performed at [facility] 06/21/2014 FINAL 06/20/2014 1030     Radiological Exams on Admission: Ct Knee Left Wo Contrast  Result Date: 04/25/2016 CLINICAL DATA:  Evaluate for fracture. Radiographs demonstrate a joint effusion with no convincing fracture. Trauma was 2 days ago. EXAM: CT OF THE LEFT KNEE WITHOUT CONTRAST TECHNIQUE: Multidetector CT imaging of the LEFT knee was performed according to the standard protocol. Multiplanar CT image reconstructions were also generated. COMPARISON:  Current left knee radiographs. FINDINGS: Study mildly degraded by motion and the artifact from the knee prosthesis. Bones/Joint/Cartilage No fracture.  No bone lesion. The knee prosthetic  components appear well seated and well aligned with no evidence of loosening. Ligaments Suboptimally assessed by CT. Muscles and Tendons Grossly unremarkable allowing for artifact from the knee prosthesis. Soft tissues Small joint effusion.  No soft tissue hematoma. IMPRESSION: 1. No CT evidence of a fracture. No evidence of loosening of the orthopedic hardware. 2. Small joint effusion. Electronically Signed   By: Lajean Manes M.D.   On: 04/25/2016 18:33   Dg Knee Complete 4 Views Left  Result Date: 04/25/2016 CLINICAL DATA:  Pain along the anteromedial left knee after trauma 2 days ago. EXAM: LEFT KNEE - COMPLETE 4+ VIEW COMPARISON:  None. FINDINGS: The patient is status post left knee replacement. No evidence of hardware failure. There is a small joint effusion. No acute fractures identified. IMPRESSION: Small joint effusion after trauma. No identified fracture. No evidence of hardware failure. Electronically Signed   By: Dorise Bullion III M.D   On: 04/25/2016 14:12   Dg Hip Unilat W Or Wo Pelvis 2-3 Views Left  Result Date: 04/25/2016 CLINICAL DATA:  Acute onset of generalized left leg pain. Initial encounter. EXAM: DG HIP (WITH OR WITHOUT PELVIS) 2-3V LEFT COMPARISON:  Left hip radiographs performed 08/12/2014 FINDINGS: There is no evidence of fracture or dislocation. Both femoral heads are seated normally within their respective acetabula. The proximal left femur appears intact. No significant  degenerative change is appreciated. The sacroiliac joints are unremarkable in appearance. The visualized bowel gas pattern is grossly unremarkable in appearance. IMPRESSION: No evidence of fracture or dislocation. Electronically Signed   By: Garald Balding M.D.   On: 04/25/2016 18:53    Chart has been reviewed    Assessment/Plan  83 y.o. female with medical history significant of HTN, cardiomyopathy  Hypothyroidism arthritis, GERD, history of endometrial   Cancer admitted for Dehydration resulting in  AKI, UTI and pain control   Present on Admission: . Essential hypertension continue Cardura  and Norvasc but hold lisinopril given AKI . Left hip/knee pain - suspect musculoskeletal extensive imaging showed no evidence of fracture patient inability to ambulate also secondary to acute encephalopathy secondary to UTI and dehydration. Left knee has been replaced she does is history of gout but usually develops in her toes . UTI (urinary tract infection) treated Rocephin await results of urine culture . Hyponatremia in a setting of dehydration and decreased solute intake will rehydrate and follow sodium obtain urine nitrites and check TSH . AKI (acute kidney injury) ([place]) likely secondary to decreased by mouth intake and dehydration will rehydrate and follow creatinine obtain urine electrolytes. Follow renal function. If no improvement may need evaluation with renal ultrasound . Dehydration administer gentle IV fluids of fall . Leucocytosis likely secondary to UTI and hemoconcentration . Acute encephalopathy in the setting of UTI dehydration as well as drug administration. We'll rehydrate correct metabolic abnormalities and follow . Leg edema, right obtain Dopplers to evaluate for DVT given her immobility  debility secondary to recent fall will have PT OT evaluate prior to discharge to home  Other plan as per orders.  DVT prophylaxis:  Lovenox    Code Status:  FULL CODE  as per family   Family Communication:   Family   at  Bedside  plan of care was discussed with Daughter and son Disposition Plan:    To home once workup is complete and patient is stable                         Would benefit from PT/OT eval prior to DC  ordered                                                 Consults called: none    Admission status:    inpatient   patient will likely require more than 2 midnights while being provided hospital care secondary to new medical problems requiring intervention   Level of care     medical floor          I have spent a total of 50 min on this admission   Elijah Michaelis 04/25/2016, 11:44 PM    [facility]  Pager [phone]   after 2 AM please page floor coverage PA If 7AM-7PM, please contact the day team taking care of the patient  [email or web address]  Password [email or web address]

## 2016-04-25 NOTE — ED Notes (Signed)
IV ATTEMPT X1 UNSUCCESSFUL.

## 2016-04-26 ENCOUNTER — Encounter (HOSPITAL_COMMUNITY): Payer: Self-pay

## 2016-04-26 ENCOUNTER — Inpatient Hospital Stay (HOSPITAL_COMMUNITY): Payer: Medicare Other

## 2016-04-26 DIAGNOSIS — M7989 Other specified soft tissue disorders: Secondary | ICD-10-CM

## 2016-04-26 LAB — PHOSPHORUS: Phosphorus: 4.8 mg/dL — ABNORMAL HIGH (ref 2.5–4.6)

## 2016-04-26 LAB — SODIUM, URINE, RANDOM: Sodium, Ur: 17 mmol/L

## 2016-04-26 LAB — COMPREHENSIVE METABOLIC PANEL
ALT: 27 U/L (ref 14–54)
AST: 23 U/L (ref 15–41)
Albumin: 3 g/dL — ABNORMAL LOW (ref 3.5–5.0)
Alkaline Phosphatase: 110 U/L (ref 38–126)
Anion gap: 13 (ref 5–15)
BUN: 47 mg/dL — ABNORMAL HIGH (ref 6–20)
CO2: 21 mmol/L — ABNORMAL LOW (ref 22–32)
Calcium: 8.6 mg/dL — ABNORMAL LOW (ref 8.9–10.3)
Chloride: 103 mmol/L (ref 101–111)
Creatinine, Ser: 1.79 mg/dL — ABNORMAL HIGH (ref 0.44–1.00)
GFR calc Af Amer: 29 mL/min — ABNORMAL LOW (ref >60)
GFR calc non Af Amer: 25 mL/min — ABNORMAL LOW (ref >60)
Glucose, Bld: 100 mg/dL — ABNORMAL HIGH (ref 65–99)
Potassium: 3.5 mmol/L (ref 3.5–5.1)
Sodium: 137 mmol/L (ref 135–145)
Total Bilirubin: 0.9 mg/dL (ref 0.3–1.2)
Total Protein: 6.6 g/dL (ref 6.5–8.1)

## 2016-04-26 LAB — CBC
HCT: 31 % — ABNORMAL LOW (ref 36.0–46.0)
Hemoglobin: 10 g/dL — ABNORMAL LOW (ref 12.0–15.0)
MCH: 26.9 pg (ref 26.0–34.0)
MCHC: 32.3 g/dL (ref 30.0–36.0)
MCV: 83.3 fL (ref 78.0–100.0)
Platelets: 236 10*3/uL (ref 150–400)
RBC: 3.72 MIL/uL — ABNORMAL LOW (ref 3.87–5.11)
RDW: 17.5 % — ABNORMAL HIGH (ref 11.5–15.5)
WBC: 14.2 10*3/uL — ABNORMAL HIGH (ref 4.0–10.5)

## 2016-04-26 LAB — CREATININE, URINE, RANDOM: Creatinine, Urine: 166.74 mg/dL

## 2016-04-26 LAB — OSMOLALITY, URINE: Osmolality, Ur: 513 mOsm/kg (ref 300–900)

## 2016-04-26 LAB — TSH: TSH: 2.435 u[IU]/mL (ref 0.350–4.500)

## 2016-04-26 LAB — MAGNESIUM: Magnesium: 2.1 mg/dL (ref 1.7–2.4)

## 2016-04-26 MED ORDER — ACETAMINOPHEN 325 MG PO TABS
650.0000 mg | ORAL_TABLET | Freq: Four times a day (QID) | ORAL | Status: DC | PRN
  Administered 2016-04-27 – 2016-04-28 (×5): 650 mg via ORAL
  Filled 2016-04-26 (×5): qty 2

## 2016-04-26 MED ORDER — LIP MEDEX EX OINT
TOPICAL_OINTMENT | CUTANEOUS | Status: AC
  Administered 2016-04-26: 1
  Filled 2016-04-26: qty 7

## 2016-04-26 MED ORDER — SODIUM CHLORIDE 0.9 % IV SOLN: INTRAVENOUS | Status: DC

## 2016-04-26 MED ORDER — DOXAZOSIN MESYLATE 8 MG PO TABS
8.0000 mg | ORAL_TABLET | Freq: Every day | ORAL | Status: DC
  Administered 2016-04-26 – 2016-04-28 (×4): 8 mg via ORAL
  Filled 2016-04-26 (×5): qty 1

## 2016-04-26 MED ORDER — ACETAMINOPHEN 650 MG RE SUPP: 650.0000 mg | Freq: Four times a day (QID) | RECTAL | Status: DC | PRN

## 2016-04-26 MED ORDER — ONDANSETRON HCL 4 MG/2ML IJ SOLN: 4.0000 mg | Freq: Four times a day (QID) | INTRAMUSCULAR | Status: DC | PRN

## 2016-04-26 MED ORDER — METOPROLOL TARTRATE 50 MG PO TABS
100.0000 mg | ORAL_TABLET | Freq: Two times a day (BID) | ORAL | Status: DC
  Administered 2016-04-26 – 2016-04-29 (×8): 100 mg via ORAL
  Filled 2016-04-26 (×9): qty 2

## 2016-04-26 MED ORDER — ENOXAPARIN SODIUM 30 MG/0.3ML ~~LOC~~ SOLN
30.0000 mg | SUBCUTANEOUS | Status: DC
  Administered 2016-04-26 – 2016-04-27 (×2): 30 mg via SUBCUTANEOUS
  Filled 2016-04-26 (×2): qty 0.3

## 2016-04-26 MED ORDER — HYDROCODONE-ACETAMINOPHEN 5-325 MG PO TABS
1.0000 | ORAL_TABLET | ORAL | Status: DC | PRN
  Administered 2016-04-26 – 2016-04-27 (×3): 1 via ORAL
  Filled 2016-04-26 (×3): qty 1

## 2016-04-26 MED ORDER — CEFTRIAXONE SODIUM 1 G IJ SOLR
1.0000 g | Freq: Once | INTRAMUSCULAR | Status: AC
  Administered 2016-04-26: 1 g via INTRAVENOUS
  Filled 2016-04-26: qty 10

## 2016-04-26 MED ORDER — AMLODIPINE BESYLATE 5 MG PO TABS
5.0000 mg | ORAL_TABLET | Freq: Every day | ORAL | Status: DC
  Administered 2016-04-26 – 2016-04-28 (×3): 5 mg via ORAL
  Filled 2016-04-26 (×3): qty 1

## 2016-04-26 MED ORDER — ONDANSETRON HCL 4 MG PO TABS: 4.0000 mg | ORAL_TABLET | Freq: Four times a day (QID) | ORAL | Status: DC | PRN

## 2016-04-26 MED ORDER — COLCHICINE 0.6 MG PO TABS
0.6000 mg | ORAL_TABLET | Freq: Two times a day (BID) | ORAL | Status: DC
  Administered 2016-04-26 – 2016-04-29 (×8): 0.6 mg via ORAL
  Filled 2016-04-26 (×9): qty 1

## 2016-04-26 MED ORDER — SODIUM CHLORIDE 0.9 % IV SOLN
INTRAVENOUS | Status: AC
  Administered 2016-04-26: 03:00:00 via INTRAVENOUS

## 2016-04-26 NOTE — ED Notes (Signed)
This RN called the floor. Receiving RN is OTF with another pt. Floor states that they will call back.

## 2016-04-26 NOTE — Progress Notes (Signed)
*  PRELIMINARY RESULTS* Vascular Ultrasound Lower extremity venous duplex has been completed.  Preliminary findings: No evidence of DVT or baker's cyst.  Landry Mellow, RDMS, RVT  04/26/2016, 11:50 AM

## 2016-04-26 NOTE — Progress Notes (Signed)
PROGRESS NOTE    Valerie Ramirez  [id number] DOB: 09-02-1932 DOA: 04/25/2016 PCP: Loura Pardon, MD    Brief Narrative:  83 yo female with HTN presents with left knee pain. Recent radial fracture. At home with ambulatory dysfunction, noted foul-smelling urine. On the initial examination afebrile, hemodynamic stable. Noted worsening renal function. Admitted for urine infection and dehydration.    Assessment & Plan:   Active Problems:   Essential hypertension   Left hip pain   UTI (urinary tract infection)   Hyponatremia   AKI (acute kidney injury) ([place])   Dehydration   Leucocytosis   Acute encephalopathy   Leg edema, right   1. Urine infection. Will continue antibiotic therapy with ceftriaxone, follow on cell count, cultures and temperature curve.   2. Ambulatory dysfunction. Right knee pain, no signs of acute arthritis, will continue physical therapy evaluation. Out of bed as tolerated. Continue pain control.   3. AKI. Will continue gentle hydration with saline, at 75 cc.hr, follow renal panel in am, avoid hypotension or nephrotoxic medications.   4. HTN. Will continue blood pressure control with amlodipine.     DVT prophylaxis: enoxaparin  Code Status: full  Family Communication: No family at the bedside  Disposition Plan: home   Consultants:     Procedures:    Antimicrobials:   ceftriaxone    Subjective: Patient somnolent , no nausea or vomiting, no chest pain or dyspnea, tolerating po well.   Objective: Vitals:   04/25/16 2225 04/25/16 2330 04/26/16 0201 04/26/16 0535  BP: 164/62 154/57 (!) 188/55 (!) 150/55  Pulse: 81 76 78 73  Resp: 20 20 20 20   Temp:   97.7 F (36.5 C) 98 F (36.7 C)  TempSrc:   Oral Oral  SpO2: 92% 98% 100% 97%  Weight:      Height:        Intake/Output Summary (Last 24 hours) at 04/26/16 1302 Last data filed at 04/26/16 0929  Gross per 24 hour  Intake              350 ml  Output             1050 ml  Net              -700 ml   Filed Weights   04/25/16 1923  Weight: 104.3 kg (230 lb)    Examination:  General exam: somnolent and deconditioned E ENT. Mild pallor, oral mucosa dry.  Respiratory system: Clear to auscultation. Respiratory effort normal. No wheezing, rales or rhonchi.  Cardiovascular system: S1 & S2 heard, RRR. No JVD, murmurs, rubs, gallops or clicks. Non pitting pedal edema. Gastrointestinal system: Abdomen is nondistended, soft and nontender. No organomegaly or masses felt. Normal bowel sounds heard. Central nervous system: Alert and oriented. No focal neurological deficits. Extremities: Symmetric 5 x 5 power. Skin: No rashes, lesions or ulcers.     Data Reviewed: I have personally reviewed following labs and imaging studies  CBC:  Recent Labs Lab 04/25/16 2143 04/26/16 0442  WBC 15.2* 14.2*  NEUTROABS 12.1*  --   HGB 9.7* 10.0*  HCT 30.5* 31.0*  MCV 83.3 83.3  PLT 227 [id number]   Basic Metabolic Panel:  Recent Labs Lab 04/25/16 2143 04/26/16 0442  NA 132* 137  K 3.7 3.5  CL 100* 103  CO2 21* 21*  GLUCOSE 103* 100*  BUN 51* 47*  CREATININE 1.93* 1.79*  CALCIUM 8.4* 8.6*  MG  --  2.1  PHOS  --  4.8*   GFR: Estimated Creatinine Clearance: 27.9 mL/min (by C-G formula based on SCr of 1.79 mg/dL (H)). Liver Function Tests:  Recent Labs Lab 04/25/16 2143 04/26/16 0442  AST 27 23  ALT 27 27  ALKPHOS 111 110  BILITOT 1.0 0.9  PROT 6.8 6.6  ALBUMIN 3.2* 3.0*   No results for input(s): LIPASE, AMYLASE in the last 168 hours. No results for input(s): AMMONIA in the last 168 hours. Coagulation Profile: No results for input(s): INR, PROTIME in the last 168 hours. Cardiac Enzymes: No results for input(s): CKTOTAL, CKMB, CKMBINDEX, TROPONINI in the last 168 hours. BNP (last 3 results) No results for input(s): PROBNP in the last 8760 hours. HbA1C: No results for input(s): HGBA1C in the last 72 hours. CBG: No results for input(s): GLUCAP in the last 168  hours. Lipid Profile: No results for input(s): CHOL, HDL, LDLCALC, TRIG, CHOLHDL, LDLDIRECT in the last 72 hours. Thyroid Function Tests:  Recent Labs  04/26/16 0442  TSH 2.435   Anemia Panel: No results for input(s): VITAMINB12, FOLATE, FERRITIN, TIBC, IRON, RETICCTPCT in the last 72 hours. Sepsis Labs: No results for input(s): PROCALCITON, LATICACIDVEN in the last 168 hours.  No results found for this or any previous visit (from the past 240 hour(s)).       Radiology Studies: Ct Knee Left Wo Contrast  Result Date: 04/25/2016 CLINICAL DATA:  Evaluate for fracture. Radiographs demonstrate a joint effusion with no convincing fracture. Trauma was 2 days ago. EXAM: CT OF THE LEFT KNEE WITHOUT CONTRAST TECHNIQUE: Multidetector CT imaging of the LEFT knee was performed according to the standard protocol. Multiplanar CT image reconstructions were also generated. COMPARISON:  Current left knee radiographs. FINDINGS: Study mildly degraded by motion and the artifact from the knee prosthesis. Bones/Joint/Cartilage No fracture.  No bone lesion. The knee prosthetic components appear well seated and well aligned with no evidence of loosening. Ligaments Suboptimally assessed by CT. Muscles and Tendons Grossly unremarkable allowing for artifact from the knee prosthesis. Soft tissues Small joint effusion.  No soft tissue hematoma. IMPRESSION: 1. No CT evidence of a fracture. No evidence of loosening of the orthopedic hardware. 2. Small joint effusion. Electronically Signed   By: Lajean Manes M.D.   On: 04/25/2016 18:33   Dg Knee Complete 4 Views Left  Result Date: 04/25/2016 CLINICAL DATA:  Pain along the anteromedial left knee after trauma 2 days ago. EXAM: LEFT KNEE - COMPLETE 4+ VIEW COMPARISON:  None. FINDINGS: The patient is status post left knee replacement. No evidence of hardware failure. There is a small joint effusion. No acute fractures identified. IMPRESSION: Small joint effusion after trauma.  No identified fracture. No evidence of hardware failure. Electronically Signed   By: Dorise Bullion III M.D   On: 04/25/2016 14:12   Dg Hip Unilat W Or Wo Pelvis 2-3 Views Left  Result Date: 04/25/2016 CLINICAL DATA:  Acute onset of generalized left leg pain. Initial encounter. EXAM: DG HIP (WITH OR WITHOUT PELVIS) 2-3V LEFT COMPARISON:  Left hip radiographs performed 08/12/2014 FINDINGS: There is no evidence of fracture or dislocation. Both femoral heads are seated normally within their respective acetabula. The proximal left femur appears intact. No significant degenerative change is appreciated. The sacroiliac joints are unremarkable in appearance. The visualized bowel gas pattern is grossly unremarkable in appearance. IMPRESSION: No evidence of fracture or dislocation. Electronically Signed   By: Garald Balding M.D.   On: 04/25/2016 18:53        Scheduled Meds: .  sodium chloride   Intravenous STAT  . amLODipine  5 mg Oral Daily  . cefTRIAXone (ROCEPHIN)  IV  1 g Intravenous Once  . colchicine  0.6 mg Oral BID  . doxazosin  8 mg Oral Daily  . enoxaparin (LOVENOX) injection  30 mg Subcutaneous Q24H  . metoprolol  100 mg Oral BID  . oxyCODONE-acetaminophen  1 tablet Oral Once   Continuous Infusions:   LOS: 1 day      Dalinda Heidt Gerome Apley, MD [facility] Pager [phone]  If 7PM-7AM, please contact night-coverage [email or web address] Password [email or web address] 04/26/2016, 1:02 PM

## 2016-04-26 NOTE — ED Notes (Signed)
This RN called the floor to ask if they were ready for the pt. Floor states that they will call back.

## 2016-04-27 LAB — CBC WITH DIFFERENTIAL/PLATELET
Basophils Absolute: 0 10*3/uL (ref 0.0–0.1)
Basophils Relative: 0 %
Eosinophils Absolute: 0.1 10*3/uL (ref 0.0–0.7)
Eosinophils Relative: 1 %
HCT: 31 % — ABNORMAL LOW (ref 36.0–46.0)
Hemoglobin: 9.7 g/dL — ABNORMAL LOW (ref 12.0–15.0)
Lymphocytes Relative: 13 %
Lymphs Abs: 1.6 10*3/uL (ref 0.7–4.0)
MCH: 26.3 pg (ref 26.0–34.0)
MCHC: 31.3 g/dL (ref 30.0–36.0)
MCV: 84 fL (ref 78.0–100.0)
Monocytes Absolute: 1.4 10*3/uL — ABNORMAL HIGH (ref 0.1–1.0)
Monocytes Relative: 12 %
Neutro Abs: 8.7 10*3/uL — ABNORMAL HIGH (ref 1.7–7.7)
Neutrophils Relative %: 74 %
Platelets: 233 10*3/uL (ref 150–400)
RBC: 3.69 MIL/uL — ABNORMAL LOW (ref 3.87–5.11)
RDW: 17.2 % — ABNORMAL HIGH (ref 11.5–15.5)
WBC: 11.9 10*3/uL — ABNORMAL HIGH (ref 4.0–10.5)

## 2016-04-27 LAB — BASIC METABOLIC PANEL
Anion gap: 10 (ref 5–15)
BUN: 40 mg/dL — ABNORMAL HIGH (ref 6–20)
CO2: 22 mmol/L (ref 22–32)
Calcium: 8.6 mg/dL — ABNORMAL LOW (ref 8.9–10.3)
Chloride: 105 mmol/L (ref 101–111)
Creatinine, Ser: 1.5 mg/dL — ABNORMAL HIGH (ref 0.44–1.00)
GFR calc Af Amer: 36 mL/min — ABNORMAL LOW (ref >60)
GFR calc non Af Amer: 31 mL/min — ABNORMAL LOW (ref >60)
Glucose, Bld: 104 mg/dL — ABNORMAL HIGH (ref 65–99)
Potassium: 3.6 mmol/L (ref 3.5–5.1)
Sodium: 137 mmol/L (ref 135–145)

## 2016-04-27 MED ORDER — DEXTROSE 5 % IV SOLN
2.0000 g | INTRAVENOUS | Status: DC
  Administered 2016-04-27: 2 g via INTRAVENOUS
  Filled 2016-04-27 (×2): qty 2

## 2016-04-27 MED ORDER — ENOXAPARIN SODIUM 40 MG/0.4ML ~~LOC~~ SOLN
40.0000 mg | SUBCUTANEOUS | Status: DC
  Administered 2016-04-28 – 2016-04-29 (×2): 40 mg via SUBCUTANEOUS
  Filled 2016-04-27 (×2): qty 0.4

## 2016-04-27 NOTE — Progress Notes (Addendum)
PHARMACY NOTE -  CEFTRIAXONE  Pharmacy has been consulted to assist with dosing of Ceftriaxone for UTI.  Urine culture + Klebsiella pneumonia.  Patient received Ceftriaxone 1gm IV x 1 on 1/8 @ 17:34  BMI = 42  PLAN: Ceftriaxone 2gm IV q24h (dosing for obesity). Dosage to remain stable and need for further dosage adjustment appears unlikely at present.    Will sign off at this time.  Please reconsult if a change in clinical status warrants re-evaluation of dosage.  Leone Haven, PharmD

## 2016-04-27 NOTE — Progress Notes (Signed)
PROGRESS NOTE    Valerie Ramirez  [id number] DOB: 02-01-1933 DOA: 04/25/2016 PCP: Loura Pardon, MD   Brief Narrative:  83 yo female with HTN presents with left knee pain. Recent radial fracture. At home with ambulatory dysfunction, noted foul-smelling urine. On the initial examination afebrile, hemodynamic stable. Noted worsening renal function. Admitted for urine infection and dehydration. Patient responding well to medical therapy, renal function improving, urine culture positive for K. Pneumo. Plan to discharge to SNF when bed available.     Assessment & Plan:   Active Problems:   Essential hypertension   Left hip pain   UTI (urinary tract infection)   Hyponatremia   AKI (acute kidney injury) ([place])   Dehydration   Leucocytosis   Acute encephalopathy   Leg edema, right   1. Urine infection. Continue antibiotic therapy with ceftriaxone, urine culture positive for K. Pneumo more than 100, 000 CFU. Will follow on sensitivities. Wbc down to 11 from 14.   2. Ambulatory dysfunction. Right knee pain, no signs of acute arthritis, physical therapy evaluation with recommendations for skill nursing facility at discharge.  3. AKI. Continue isotonic saline, at 75 cc.hr,for hydration, cr continue to trend down. K at 3,6 and cl at 105. Will follow renal panel in am, avoid hypotension or nephrotoxic medications.   4. HTN. Will continue blood pressure control with amlodipine and metoprolol.     DVT prophylaxis: enoxaparin  Code Status: full  Family Communication: No family at the bedside  Disposition Plan: home   Consultants:     Procedures:    Antimicrobials:   ceftriaxone #1  Subjective: Patient had episode of agitation last night, this am more alert, no confusion or agitation. No chest pain or dyspnea, but generalized weakness. No nausea or vomiting and tolerating po well.   Objective: Vitals:   04/26/16 2101 04/27/16 0708 04/27/16 0921 04/27/16 1421  BP: (!)  153/79 (!) 175/64 (!) 147/35 (!) 162/68  Pulse: 99 75 74 66  Resp: 18 18 20 20   Temp: 99.4 F (37.4 C) 99 F (37.2 C)  98.8 F (37.1 C)  TempSrc: Oral Axillary  Oral  SpO2: 93% 94% 96% 94%  Weight:      Height:        Intake/Output Summary (Last 24 hours) at 04/27/16 1518 Last data filed at 04/27/16 1047  Gross per 24 hour  Intake             1345 ml  Output             1500 ml  Net             -155 ml   Filed Weights   04/25/16 1923  Weight: 104.3 kg (230 lb)    Examination:  General exam: deconditioned, not in pain or dyspnea  E ENT: mild pallor, oral mucosa moist.  Respiratory system: Clear to auscultation. Respiratory effort normal. Mild decreased breath sounds at bases.  Cardiovascular system: S1 & S2 heard, RRR. No JVD, murmurs, rubs, gallops or clicks. Non pitting pedal edema. Gastrointestinal system: Abdomen is nondistended, soft and nontender. No organomegaly or masses felt. Normal bowel sounds heard. Central nervous system: Alert and oriented. No focal neurological deficits. Extremities: Symmetric 5 x 5 power. Skin: No rashes, lesions or ulcers  Data Reviewed: I have personally reviewed following labs and imaging studies  CBC:  Recent Labs Lab 04/25/16 2143 04/26/16 0442 04/27/16 0511  WBC 15.2* 14.2* 11.9*  NEUTROABS 12.1*  --  8.7*  HGB  9.7* 10.0* 9.7*  HCT 30.5* 31.0* 31.0*  MCV 83.3 83.3 84.0  PLT 227 236 0000000   Basic Metabolic Panel:  Recent Labs Lab 04/25/16 2143 04/26/16 0442 04/27/16 0511  NA 132* 137 137  K 3.7 3.5 3.6  CL 100* 103 105  CO2 21* 21* 22  GLUCOSE 103* 100* 104*  BUN 51* 47* 40*  CREATININE 1.93* 1.79* 1.50*  CALCIUM 8.4* 8.6* 8.6*  MG  --  2.1  --   PHOS  --  4.8*  --    GFR: Estimated Creatinine Clearance: 33.3 mL/min (by C-G formula based on SCr of 1.5 mg/dL (H)). Liver Function Tests:  Recent Labs Lab 04/25/16 2143 04/26/16 0442  AST 27 23  ALT 27 27  ALKPHOS 111 110  BILITOT 1.0 0.9  PROT 6.8 6.6    ALBUMIN 3.2* 3.0*   No results for input(s): LIPASE, AMYLASE in the last 168 hours. No results for input(s): AMMONIA in the last 168 hours. Coagulation Profile: No results for input(s): INR, PROTIME in the last 168 hours. Cardiac Enzymes: No results for input(s): CKTOTAL, CKMB, CKMBINDEX, TROPONINI in the last 168 hours. BNP (last 3 results) No results for input(s): PROBNP in the last 8760 hours. HbA1C: No results for input(s): HGBA1C in the last 72 hours. CBG: No results for input(s): GLUCAP in the last 168 hours. Lipid Profile: No results for input(s): CHOL, HDL, LDLCALC, TRIG, CHOLHDL, LDLDIRECT in the last 72 hours. Thyroid Function Tests:  Recent Labs  04/26/16 0442  TSH 2.435   Anemia Panel: No results for input(s): VITAMINB12, FOLATE, FERRITIN, TIBC, IRON, RETICCTPCT in the last 72 hours. Sepsis Labs: No results for input(s): PROCALCITON, LATICACIDVEN in the last 168 hours.  Recent Results (from the past 240 hour(s))  Urine culture     Status: Abnormal (Preliminary result)   Collection Time: 04/25/16  5:04 PM  Result Value Ref Range Status   Specimen Description URINE, RANDOM  Final   Special Requests NONE  Final   Culture >=100,000 COLONIES/mL KLEBSIELLA PNEUMONIAE (A)  Final   Report Status PENDING  Incomplete         Radiology Studies: Ct Knee Left Wo Contrast  Result Date: 04/25/2016 CLINICAL DATA:  Evaluate for fracture. Radiographs demonstrate a joint effusion with no convincing fracture. Trauma was 2 days ago. EXAM: CT OF THE LEFT KNEE WITHOUT CONTRAST TECHNIQUE: Multidetector CT imaging of the LEFT knee was performed according to the standard protocol. Multiplanar CT image reconstructions were also generated. COMPARISON:  Current left knee radiographs. FINDINGS: Study mildly degraded by motion and the artifact from the knee prosthesis. Bones/Joint/Cartilage No fracture.  No bone lesion. The knee prosthetic components appear well seated and well aligned  with no evidence of loosening. Ligaments Suboptimally assessed by CT. Muscles and Tendons Grossly unremarkable allowing for artifact from the knee prosthesis. Soft tissues Small joint effusion.  No soft tissue hematoma. IMPRESSION: 1. No CT evidence of a fracture. No evidence of loosening of the orthopedic hardware. 2. Small joint effusion. Electronically Signed   By: Lajean Manes M.D.   On: 04/25/2016 18:33   Dg Hip Unilat W Or Wo Pelvis 2-3 Views Left  Result Date: 04/25/2016 CLINICAL DATA:  Acute onset of generalized left leg pain. Initial encounter. EXAM: DG HIP (WITH OR WITHOUT PELVIS) 2-3V LEFT COMPARISON:  Left hip radiographs performed 08/12/2014 FINDINGS: There is no evidence of fracture or dislocation. Both femoral heads are seated normally within their respective acetabula. The proximal left femur appears intact.  No significant degenerative change is appreciated. The sacroiliac joints are unremarkable in appearance. The visualized bowel gas pattern is grossly unremarkable in appearance. IMPRESSION: No evidence of fracture or dislocation. Electronically Signed   By: Garald Balding M.D.   On: 04/25/2016 18:53        Scheduled Meds: . amLODipine  5 mg Oral Daily  . colchicine  0.6 mg Oral BID  . doxazosin  8 mg Oral Daily  . [START ON 04/28/2016] enoxaparin (LOVENOX) injection  40 mg Subcutaneous Q24H  . metoprolol  100 mg Oral BID  . oxyCODONE-acetaminophen  1 tablet Oral Once   Continuous Infusions: . sodium chloride 75 mL/hr at 04/26/16 1400     LOS: 2 days        Mauricio Gerome Apley, MD [facility] Pager [phone]  If 7PM-7AM, please contact night-coverage [email or web address] Password [email or web address] 04/27/2016, 3:18 PM

## 2016-04-27 NOTE — Evaluation (Signed)
Physical Therapy Evaluation Patient Details Name: Valerie Ramirez MRN: [id number] DOB: 09-Mar-1933 Today's Date: 04/27/2016   History of Present Illness  Valerie Ramirez is a 83 y.o. female with medical history significant of HTN, cardiomyopathy Hypothyroidism arthritis, GERD, history of endometrial CA. adm d/t inability to walk, c/o LLE pain;pt fell approximately 1 week prior to adm  after husband lost his balance, she tried to assist him, and then he fell on top of her; notes indicated radial fx, knee xrays negative  Clinical Impression  Pt admitted with above diagnosis. Pt currently with functional limitations due to the deficits listed below (see PT Problem List).  Pt will benefit from skilled PT to increase their independence and safety with mobility to allow discharge to the venue listed below.  Pt is with extremely limited mobility,  has difficulty sequencing and  following commands consistently; recommend SNF     Follow Up Recommendations SNF    Equipment Recommendations  None recommended by PT    Recommendations for Other Services       Precautions / Restrictions Precautions Precautions: Fall Required Braces or Orthoses: Other Brace/Splint Other Brace/Splint: right wrist splint--notes state "recent radius fx" no further orders--?may need PFRW if becomes ambulatory? Restrictions Weight Bearing Restrictions: No      Mobility  Bed Mobility Overal bed mobility: Needs Assistance Bed Mobility: Supine to Sit;Sit to Supine     Supine to sit: Mod assist;HOB elevated;Max assist Sit to supine: Total assist;+2 for physical assistance   General bed mobility comments: incr time, assist with LEs, trunk, bed pad used to scoot pt, strong verbal encouragement  Transfers Overall transfer level: Needs assistance Equipment used: Rolling walker (2 wheeled) Transfers: Sit to/from Stand Sit to Stand: +2 physical assistance;Max assist;From elevated surface         General transfer  comment: pt briefly able to maintain standing, unable to advance LEs; multi-modal cues however pt had difficulty following cues/directions to assist self  Ambulation/Gait                Stairs            Wheelchair Mobility    Modified Rankin (Stroke Patients Only)       Balance Overall balance assessment: Needs assistance Sitting-balance support: No upper extremity supported Sitting balance-Leahy Scale: Fair       Standing balance-Leahy Scale: Zero                               Pertinent Vitals/Pain Pain Assessment: Faces Faces Pain Scale: Hurts even more Pain Location: legs Pain Descriptors / Indicators: Sore Pain Intervention(s): Limited activity within patient's tolerance;Monitored during session    Home Living Family/patient expects to be discharged to:: Unsure Living Arrangements: Spouse/significant other   Type of Home: House Home Access: Stairs to enter Entrance Stairs-Rails: Right Entrance Stairs-Number of Steps: 3 Home Layout: One level Home Equipment: Walker - 2 wheels;Cane - single point;Shower seat - built in Additional Comments: pt reports her husband needs assist with walking, she reports he is also in the hospital [facility] at [facility] on the 5th floor    Prior Function Level of Independence: Independent               Hand Dominance        Extremity/Trunk Assessment   Upper Extremity Assessment Upper Extremity Assessment: Generalized weakness    Lower Extremity Assessment Lower Extremity Assessment: Generalized weakness  Communication   Communication: No difficulties  Cognition Arousal/Alertness: Awake/alert Behavior During Therapy: WFL for tasks assessed/performed Overall Cognitive Status: Impaired/Different from baseline Area of Impairment: Orientation;Problem solving;Awareness Orientation Level: Disoriented to;Situation           Problem Solving: Difficulty sequencing;Requires verbal cues;Requires  tactile cues General Comments: unable to reason with pt that we could not leave her sitting on EOB d/t safety concerns; pt unhappy with PT and NT because we put her back to bed     General Comments      Exercises General Exercises - Lower Extremity Heel Slides: AROM;Both;5 reps   Assessment/Plan    PT Assessment Patient needs continued PT services  PT Problem List Decreased strength;Decreased range of motion;Decreased activity tolerance;Decreased mobility;Obesity;Decreased knowledge of use of DME          PT Treatment Interventions DME instruction;Functional mobility training;Therapeutic activities;Therapeutic exercise;Patient/family education    PT Goals (Current goals can be found in the Care Plan section)  Acute Rehab PT Goals Patient Stated Goal: does not state PT Goal Formulation: With patient Time For Goal Achievement: 05/11/16 Potential to Achieve Goals: Good    Frequency Min 3X/week   Barriers to discharge        Co-evaluation               End of Session Equipment Utilized During Treatment: Gait belt;Other (comment) (R wrist spllint) Activity Tolerance: Patient tolerated treatment well Patient left: in bed;with call bell/phone within reach;with family/visitor present;with bed alarm set           Time: [id number] PT Time Calculation (min) (ACUTE ONLY): 29 min   Charges:   PT Evaluation $PT Eval Moderate Complexity: 1 Procedure PT Treatments $Therapeutic Activity: 8-22 mins   PT G Codes:        Elis Rawlinson 2016-05-26, 1:02 PM

## 2016-04-27 NOTE — Progress Notes (Signed)
Patient called out stating that she wanted to get out of bed and wanted her foley catheter removed. Upon assessment, pt had removed IV, site intact, IV catheter found on floor. Explained that RN and NT could assist pt with dangling on side of bed again, but hesitant to get her up to chair, as she did not do well with dangling earlier this evening. RN explained that foley was put in earlier today per pt request, d/t poor pain control, and difficulty getting up. RN was able to calm pt down, in which she agreed to leave foley until PT could try to get pt up with more assistance in the morning. Pt refused IV stick and agreed to take some pain medication prior to attempting to dangle again.

## 2016-04-28 DIAGNOSIS — E871 Hypo-osmolality and hyponatremia: Secondary | ICD-10-CM

## 2016-04-28 DIAGNOSIS — D72829 Elevated white blood cell count, unspecified: Secondary | ICD-10-CM

## 2016-04-28 DIAGNOSIS — N390 Urinary tract infection, site not specified: Secondary | ICD-10-CM

## 2016-04-28 DIAGNOSIS — M25552 Pain in left hip: Secondary | ICD-10-CM

## 2016-04-28 DIAGNOSIS — B961 Klebsiella pneumoniae [K. pneumoniae] as the cause of diseases classified elsewhere: Secondary | ICD-10-CM

## 2016-04-28 DIAGNOSIS — M79605 Pain in left leg: Secondary | ICD-10-CM

## 2016-04-28 DIAGNOSIS — N179 Acute kidney failure, unspecified: Principal | ICD-10-CM

## 2016-04-28 DIAGNOSIS — E860 Dehydration: Secondary | ICD-10-CM

## 2016-04-28 DIAGNOSIS — I10 Essential (primary) hypertension: Secondary | ICD-10-CM

## 2016-04-28 LAB — CBC WITH DIFFERENTIAL/PLATELET
Basophils Absolute: 0 10*3/uL (ref 0.0–0.1)
Basophils Relative: 0 %
Eosinophils Absolute: 0.2 10*3/uL (ref 0.0–0.7)
Eosinophils Relative: 2 %
HCT: 32.7 % — ABNORMAL LOW (ref 36.0–46.0)
Hemoglobin: 10.5 g/dL — ABNORMAL LOW (ref 12.0–15.0)
Lymphocytes Relative: 15 %
Lymphs Abs: 2 10*3/uL (ref 0.7–4.0)
MCH: 26.6 pg (ref 26.0–34.0)
MCHC: 32.1 g/dL (ref 30.0–36.0)
MCV: 83 fL (ref 78.0–100.0)
Monocytes Absolute: 1.4 10*3/uL — ABNORMAL HIGH (ref 0.1–1.0)
Monocytes Relative: 11 %
Neutro Abs: 9.4 10*3/uL — ABNORMAL HIGH (ref 1.7–7.7)
Neutrophils Relative %: 72 %
Platelets: 243 10*3/uL (ref 150–400)
RBC: 3.94 MIL/uL (ref 3.87–5.11)
RDW: 17 % — ABNORMAL HIGH (ref 11.5–15.5)
WBC: 13 10*3/uL — ABNORMAL HIGH (ref 4.0–10.5)

## 2016-04-28 LAB — BASIC METABOLIC PANEL
Anion gap: 10 (ref 5–15)
BUN: 30 mg/dL — ABNORMAL HIGH (ref 6–20)
CO2: 21 mmol/L — ABNORMAL LOW (ref 22–32)
Calcium: 8.9 mg/dL (ref 8.9–10.3)
Chloride: 106 mmol/L (ref 101–111)
Creatinine, Ser: 1.29 mg/dL — ABNORMAL HIGH (ref 0.44–1.00)
GFR calc Af Amer: 44 mL/min — ABNORMAL LOW (ref >60)
GFR calc non Af Amer: 38 mL/min — ABNORMAL LOW (ref >60)
Glucose, Bld: 121 mg/dL — ABNORMAL HIGH (ref 65–99)
Potassium: 3.8 mmol/L (ref 3.5–5.1)
Sodium: 137 mmol/L (ref 135–145)

## 2016-04-28 LAB — C DIFFICILE QUICK SCREEN W PCR REFLEX
C Diff antigen: NEGATIVE
C Diff interpretation: NOT DETECTED
C Diff toxin: NEGATIVE

## 2016-04-28 LAB — URINE CULTURE: Culture: >=100000 — AB

## 2016-04-28 MED ORDER — CEFUROXIME AXETIL 250 MG PO TABS
250.0000 mg | ORAL_TABLET | Freq: Two times a day (BID) | ORAL | Status: DC
  Administered 2016-04-28 – 2016-04-29 (×3): 250 mg via ORAL
  Filled 2016-04-28 (×3): qty 1

## 2016-04-28 MED ORDER — AMLODIPINE BESYLATE 5 MG PO TABS
2.5000 mg | ORAL_TABLET | Freq: Once | ORAL | Status: AC
  Administered 2016-04-28: 2.5 mg via ORAL
  Filled 2016-04-28: qty 1

## 2016-04-28 MED ORDER — AMLODIPINE BESYLATE 5 MG PO TABS: 7.5000 mg | ORAL_TABLET | Freq: Every day | ORAL | Status: DC

## 2016-04-28 MED ORDER — AMLODIPINE BESYLATE 10 MG PO TABS: 10.0000 mg | ORAL_TABLET | Freq: Every day | ORAL | Status: DC

## 2016-04-28 NOTE — Progress Notes (Signed)
PROGRESS NOTE    Valerie Ramirez  [id number] DOB: 1932/05/23 DOA: 04/25/2016 PCP: Loura Pardon, MD    Brief Narrative:  83 yo female with HTN presents with left knee pain. Recent radial fracture. At home with ambulatory dysfunction, noted foul-smelling urine. On the initial examination afebrile, hemodynamic stable. Noted worsening renal function. Admitted for urine infection and dehydration. Patient responding well to medical therapy, renal function improving, urine culture positive for K. Pneumo. Plan to discharge to SNF when bed available.    Assessment & Plan:   Principal Problem:   AKI (acute kidney injury) ([place]) Active Problems:   UTI due to Klebsiella species   Essential hypertension   Left hip pain   Hyponatremia   Dehydration   Leucocytosis   Acute encephalopathy   Leg edema, right  #1 acute kidney injury Likely secondary to prerenal azotemia in the setting of ACE inhibitor. Renal function improving with hydration. Follow.  #2 Klebsiella pneumoniae UTI On IV Rocephin. Transition to oral Ceftin.  #3 hypertension Continue metoprolol, Cardura. Increase Norvasc to 7.5 mg daily. ACE inhibitor discontinued secondary to problem #1.  #4 hyponatremia Improved with hydration.  #5 dehydration IV fluids.  #6 left knee pain/ambulatory dysfunction Plain films of the right knee and CT of the left knee negative for any acute abnormalities. Lower extremity Dopplers negative. Patient has been seen by physical therapy recommended skilled nursing facility.  #7 leukocytosis Felt initially to be secondary to Klebsiella pneumoniae UTI. WBC fluctuating. Patient with no respiratory symptoms. Patient currently afebrile. Patient noted to have multiple loose stools per nurse and and as such will check a C. difficile PCR.  #8 diarrhea Per nursing. Check a C. difficile PCR.   DVT prophylaxis: Lovenox Code Status: Full Family Communication: Updated patient. No family at  bedside. Disposition Plan: Skilled nursing facility when medically stable with improvement with loose stools. Hopefully in 1-2 days.   Consultants:   None  Procedures:   CT left knee 04/25/2016  Plain films of the left knee 04/25/2016  Plain films of the left hip and pelvis 04/25/2016  Lower extremity Dopplers 04/26/2016  Antimicrobials:  IV Rocephin 04/25/2016>> 04/28/2016  Oral Ceftin 04/28/2016   Subjective: Patient denies any chest pain. No shortness of breath. Complaining bed. Per nursing patient with multiple bowel movements which are now watery.  Objective: Vitals:   04/27/16 1421 04/27/16 2022 04/27/16 2151 04/28/16 0500  BP: (!) 162/68 (!) 180/59 (!) 162/56   Pulse: 66 72 70   Resp: 20 20 18    Temp: 98.8 F (37.1 C) 98.6 F (37 C) 98.5 F (36.9 C)   TempSrc: Oral Oral Oral   SpO2: 94% 95% 94%   Weight:    72.3 kg (159 lb 4.8 oz)  Height:        Intake/Output Summary (Last 24 hours) at 04/28/16 1144 Last data filed at 04/28/16 1053  Gross per 24 hour  Intake              790 ml  Output             3201 ml  Net            -2411 ml   Filed Weights   04/25/16 1923 04/28/16 0500  Weight: 104.3 kg (230 lb) 72.3 kg (159 lb 4.8 oz)    Examination:  General exam: Appears calm and comfortable  Respiratory system: Clear to auscultation. Respiratory effort normal. Cardiovascular system: S1 & S2 heard, RRR. No JVD, murmurs, rubs, gallops  or clicks. No pedal edema. Gastrointestinal system: Abdomen is nondistended, soft and nontender. No organomegaly or masses felt. Normal bowel sounds heard. Central nervous system: Alert and oriented. No focal neurological deficits. Extremities: Symmetric 5 x 5 power. Skin: No rashes, lesions or ulcers Psychiatry: Judgement and insight appear normal. Mood & affect appropriate.     Data Reviewed: I have personally reviewed following labs and imaging studies  CBC:  Recent Labs Lab 04/25/16 2143 04/26/16 0442  04/27/16 0511 04/28/16 0509  WBC 15.2* 14.2* 11.9* 13.0*  NEUTROABS 12.1*  --  8.7* 9.4*  HGB 9.7* 10.0* 9.7* 10.5*  HCT 30.5* 31.0* 31.0* 32.7*  MCV 83.3 83.3 84.0 83.0  PLT 227 236 233 0000000   Basic Metabolic Panel:  Recent Labs Lab 04/25/16 2143 04/26/16 0442 04/27/16 0511 04/28/16 0509  NA 132* 137 137 137  K 3.7 3.5 3.6 3.8  CL 100* 103 105 106  CO2 21* 21* 22 21*  GLUCOSE 103* 100* 104* 121*  BUN 51* 47* 40* 30*  CREATININE 1.93* 1.79* 1.50* 1.29*  CALCIUM 8.4* 8.6* 8.6* 8.9  MG  --  2.1  --   --   PHOS  --  4.8*  --   --    GFR: Estimated Creatinine Clearance: 31.9 mL/min (by C-G formula based on SCr of 1.29 mg/dL (H)). Liver Function Tests:  Recent Labs Lab 04/25/16 2143 04/26/16 0442  AST 27 23  ALT 27 27  ALKPHOS 111 110  BILITOT 1.0 0.9  PROT 6.8 6.6  ALBUMIN 3.2* 3.0*   No results for input(s): LIPASE, AMYLASE in the last 168 hours. No results for input(s): AMMONIA in the last 168 hours. Coagulation Profile: No results for input(s): INR, PROTIME in the last 168 hours. Cardiac Enzymes: No results for input(s): CKTOTAL, CKMB, CKMBINDEX, TROPONINI in the last 168 hours. BNP (last 3 results) No results for input(s): PROBNP in the last 8760 hours. HbA1C: No results for input(s): HGBA1C in the last 72 hours. CBG: No results for input(s): GLUCAP in the last 168 hours. Lipid Profile: No results for input(s): CHOL, HDL, LDLCALC, TRIG, CHOLHDL, LDLDIRECT in the last 72 hours. Thyroid Function Tests:  Recent Labs  04/26/16 0442  TSH 2.435   Anemia Panel: No results for input(s): VITAMINB12, FOLATE, FERRITIN, TIBC, IRON, RETICCTPCT in the last 72 hours. Sepsis Labs: No results for input(s): PROCALCITON, LATICACIDVEN in the last 168 hours.  Recent Results (from the past 240 hour(s))  Urine culture     Status: Abnormal   Collection Time: 04/25/16  5:04 PM  Result Value Ref Range Status   Specimen Description URINE, RANDOM  Final   Special  Requests NONE  Final   Culture >=100,000 COLONIES/mL KLEBSIELLA PNEUMONIAE (A)  Final   Report Status 04/28/2016 FINAL  Final   Organism ID, Bacteria KLEBSIELLA PNEUMONIAE (A)  Final      Susceptibility   Klebsiella pneumoniae - MIC*    AMPICILLIN 8 RESISTANT Resistant     CEFAZOLIN <=4 SENSITIVE Sensitive     CEFTRIAXONE <=1 SENSITIVE Sensitive     CIPROFLOXACIN <=0.25 SENSITIVE Sensitive     GENTAMICIN <=1 SENSITIVE Sensitive     IMIPENEM <=0.25 SENSITIVE Sensitive     NITROFURANTOIN 64 INTERMEDIATE Intermediate     TRIMETH/SULFA <=20 SENSITIVE Sensitive     AMPICILLIN/SULBACTAM <=2 SENSITIVE Sensitive     PIP/TAZO <=4 SENSITIVE Sensitive     Extended ESBL NEGATIVE Sensitive     * >=100,000 COLONIES/mL KLEBSIELLA PNEUMONIAE  Radiology Studies: No results found.      Scheduled Meds: . amLODipine  2.5 mg Oral Once  . [START ON 04/29/2016] amLODipine  7.5 mg Oral Daily  . cefTRIAXone (ROCEPHIN)  IV  2 g Intravenous Q24H  . colchicine  0.6 mg Oral BID  . doxazosin  8 mg Oral Daily  . enoxaparin (LOVENOX) injection  40 mg Subcutaneous Q24H  . metoprolol  100 mg Oral BID  . oxyCODONE-acetaminophen  1 tablet Oral Once   Continuous Infusions: . sodium chloride 75 mL/hr at 04/26/16 1400     LOS: 3 days    Time spent: 49 minutes    THOMPSON,DANIEL, MD [facility] Pager [phone]  If 7PM-7AM, please contact night-coverage [email or web address] Password [email or web address] 04/28/2016, 11:44 AM

## 2016-04-28 NOTE — Progress Notes (Signed)
CSW met with pt at bedside to assist with d/c planning. Pt is alert / oriented x4. PT has recommended SNF at d/c. Pt is willing to consider this plan and gave CSW permission to contact her daughter for assistance. Message has been left for [name] [phone]. Awaiting return call. Pt reports that her spouse is at [facility] ( [place] ). CSW has initiated SNF search and bed offers are pending. CSW will review bed offers with pt / family once available.  Werner Lean LCSW [phone]

## 2016-04-28 NOTE — NC FL2 (Signed)
[place] LEVEL OF CARE SCREENING TOOL     IDENTIFICATION  Patient Name: Valerie Ramirez Birthdate: 08/19/1932 Sex: female Admission Date (Current Location): 04/25/2016  [facility] and [place] Number:  Herbalist and Address:  [facility],  [place] [address], [place]      Provider Number: [id number]  Attending Physician Name and Address:  Eugenie Filler, MD  Relative Name and Phone Number:       Current Level of Care: Hospital Recommended Level of Care: [place] Prior Approval Number:    Date Approved/Denied:   PASRR Number: [id number]  Discharge Plan: SNF    Current Diagnoses: Patient Active Problem List   Diagnosis Date Noted  . UTI (urinary tract infection) 04/25/2016  . Hyponatremia 04/25/2016  . AKI (acute kidney injury) ([place]) 04/25/2016  . Dehydration 04/25/2016  . Leucocytosis 04/25/2016  . Acute encephalopathy 04/25/2016  . Leg edema, right 04/25/2016  . Right foot pain 11/07/2015  . Status post cervical spinal fusion 08/18/2015  . Arm pain 05/16/2015  . Traumatic hematoma of lower leg 11/25/2014  . Abrasion, lower leg, anterior 11/25/2014  . Left hip pain 08/12/2014  . Osteoarthritis, hip, bilateral 08/12/2014  . Hearing loss due to cerumen impaction 06/12/2014  . Hearing loss in right ear 06/12/2014  . Colon cancer screening 08/10/2013  . Seborrheic keratoses, inflamed 08/10/2013  . Excessive cerumen in left ear canal 08/10/2013  . History of fall 04/10/2013  . Encounter for Medicare annual wellness exam 08/08/2012  . Gout 07/14/2012  . Endometrial cancer ([place]) 05/25/2012  . Degenerative joint disease of cervical spine 05/03/2011  . Other screening mammogram 03/31/2011  . Hyperglycemia 09/26/2007  . Obesity 07/12/2007  . GOITER, MULTINODULAR 07/11/2007  . Essential hypertension 07/11/2007  . CARDIOMYOPATHY 07/11/2007  . OSTEOARTHRITIS 07/11/2007  . MIXED INCONTINENCE URGE AND STRESS  07/11/2007  . Hypothyroidism 10/05/2006  . HYPERCHOLESTEROLEMIA, PURE 10/05/2006    Orientation RESPIRATION BLADDER Height & Weight     Self, Time, Situation, Place  Normal Indwelling catheter Weight: 159 lb 4.8 oz (72.3 kg) Height:  5\' 2"  (157.5 cm)  BEHAVIORAL SYMPTOMS/MOOD NEUROLOGICAL BOWEL NUTRITION STATUS  Other (Comment) (no behaviors)   Incontinent Diet  AMBULATORY STATUS COMMUNICATION OF NEEDS Skin   Extensive Assist Verbally Normal                       Personal Care Assistance Level of Assistance  Bathing, Feeding, Dressing Bathing Assistance: Maximum assistance Feeding assistance: Independent Dressing Assistance: Maximum assistance     Functional Limitations Info  Sight, Hearing, Speech Sight Info: Adequate Hearing Info: Adequate Speech Info: Adequate    SPECIAL CARE FACTORS FREQUENCY  PT (By licensed PT), OT (By licensed OT)     PT Frequency: 5x wk OT Frequency: 5x wk            Contractures Contractures Info: Not present    Additional Factors Info  Code Status Code Status Info: Full Code             Current Medications (04/28/2016):  This is the current hospital active medication list Current Facility-Administered Medications  Medication Dose Route Frequency Provider Last Rate Last Dose  . 0.9 %  sodium chloride infusion   Intravenous Continuous Tawni Millers, MD 75 mL/hr at 04/26/16 1400    . acetaminophen (TYLENOL) tablet 650 mg  650 mg Oral Q6H PRN Toy Baker, MD   650 mg at 04/28/16 [phone]  Or  . acetaminophen (TYLENOL) suppository 650 mg  650 mg Rectal Q6H PRN Toy Baker, MD      . amLODipine (NORVASC) tablet 2.5 mg  2.5 mg Oral Once Eugenie Filler, MD      . Derrill Memo ON 04/29/2016] amLODipine (NORVASC) tablet 7.5 mg  7.5 mg Oral Daily Eugenie Filler, MD      . cefTRIAXone (ROCEPHIN) 2 g in dextrose 5 % 50 mL IVPB  2 g Intravenous Q24H Leann T Poindexter, RPH   2 g at 04/27/16 1652  . colchicine tablet 0.6  mg  0.6 mg Oral BID Toy Baker, MD   0.6 mg at 04/28/16 0826  . doxazosin (CARDURA) tablet 8 mg  8 mg Oral Daily Toy Baker, MD   8 mg at 04/27/16 2151  . enoxaparin (LOVENOX) injection 40 mg  40 mg Subcutaneous Q24H Tawni Millers, MD   40 mg at 04/28/16 0827  . HYDROcodone-acetaminophen (NORCO/VICODIN) 5-325 MG per tablet 1-2 tablet  1-2 tablet Oral Q4H PRN Toy Baker, MD   1 tablet at 04/27/16 0331  . metoprolol (LOPRESSOR) tablet 100 mg  100 mg Oral BID Toy Baker, MD   100 mg at 04/28/16 0826  . ondansetron (ZOFRAN) tablet 4 mg  4 mg Oral Q6H PRN Toy Baker, MD       Or  . ondansetron (ZOFRAN) injection 4 mg  4 mg Intravenous Q6H PRN Toy Baker, MD      . oxyCODONE-acetaminophen (PERCOCET/ROXICET) 5-325 MG per tablet 1 tablet  1 tablet Oral Once Carlisle Cater, PA-C   Stopped at 04/25/16 2145     Discharge Medications: Please see discharge summary for a list of discharge medications.  Relevant Imaging Results:  Relevant Lab Results:   Additional Information SS # [id number]  Kalasia Crafton, Randall An, LCSW

## 2016-04-28 NOTE — Clinical Social Work Placement (Signed)
   CLINICAL SOCIAL WORK PLACEMENT  NOTE  Date:  04/28/2016  Patient Details  Name: Valerie Ramirez MRN: [id number] Date of Birth: October 01, 1932  Clinical Social Work is seeking post-discharge placement for this patient at the [place] level of care (*CSW will initial, date and re-position this form in  chart as items are completed):  Yes   Patient/family provided with [facility] Work Department's list of facilities offering this level of care within the geographic area requested by the patient (or if unable, by the patient's family).  Yes   Patient/family informed of their freedom to choose among providers that offer the needed level of care, that participate in Medicare, Medicaid or managed care program needed by the patient, have an available bed and are willing to accept the patient.  Yes   Patient/family informed of [facility]'s ownership interest in [facility] and [facility], as well as of the fact that they are under no obligation to receive care at these facilities.  PASRR submitted to EDS on 04/28/16     PASRR number received on 04/28/16     Existing PASRR number confirmed on       FL2 transmitted to all facilities in geographic area requested by pt/family on 04/28/16     FL2 transmitted to all facilities within larger geographic area on       Patient informed that his/her managed care company has contracts with or will negotiate with certain facilities, including the following:        Yes   Patient/family informed of bed offers received.  Patient chooses bed at [facility], [place]     Physician recommends and patient chooses bed at      Patient to be transferred to [place], [place] [place] on  .  Patient to be transferred to facility by       Patient family notified on   of transfer.  Name of family member notified:        PHYSICIAN       Additional Comment:     _______________________________________________ Luretha Rued, [place] 04/28/2016, 3:56 PM

## 2016-04-28 NOTE — Clinical Social Work Note (Signed)
Clinical Social Work Assessment  Patient Details  Name: Valerie Ramirez MRN: [id number] Date of Birth: 1932-07-03  Date of referral:  04/28/16               Reason for consult:  Facility Placement, Discharge Planning                Permission sought to share information with:  Facility Art therapist granted to share information::  Yes, Verbal Permission Granted  Name::        Agency::     Relationship::     Contact Information:     Housing/Transportation Living arrangements for the past 2 months:  [place] of Information:  Patient, Adult Children Patient Interpreter Needed:  None Criminal Activity/Legal Involvement Pertinent to Current Situation/Hospitalization:  No - Comment as needed Significant Relationships:  Adult Children, Spouse Lives with:  Self Do you feel safe going back to the place where you live?  No (SNF recommended.) Need for family participation in patient care:  Yes (Comment)  Care giving concerns: Pt's care cannot be managed at home following hospital d/c.   Social Worker assessment / plan:  Pt hospitalized on 04/25/16 from home with severe right shoulder and left knee pain after patient had sustained a fall 1 week ago when her husband fell on top of her and toppled her down. Spouse is in [facility] ( [facility] ) at this time. PT has recommended SNF for pt. CSW has met with pt / spoke with daughter / son to assist with d/c planning. Pt / family are in agreement with plans for [facility] Rehab placement. Pt / family have chosen [facility] ( [facility] SNF has been contacted and d/c plan has been confirmed. CSW will continue to follow to assist with d/c planning needs.  Employment status:  Retired Forensic scientist:  Medicare PT Recommendations:  [place] / Referral to community resources:  [place]  Patient/Family's Response to care:   Pt / family feel SNF is needed.  Patient/Family's Understanding of  and Emotional Response to Diagnosis, Current Treatment, and Prognosis:  Daughter has spoke with nsg for update. Family feels placement is needed at this time and are pleased that [facility] is able to assist with both parents. Family appreciates CSW assistance with d/c planning.  Emotional Assessment Appearance:  Appears stated age Attitude/Demeanor/Rapport:  Other (cooperative) Affect (typically observed):  Appropriate, Pleasant Orientation:  Oriented to Self, Oriented to Place, Oriented to  Time, Oriented to Situation Alcohol / Substance use:  Not Applicable Psych involvement (Current and /or in the community):  No (Comment)  Discharge Needs  Concerns to be addressed:  Discharge Planning Concerns Readmission within the last 30 days:  No Current discharge risk:  None Barriers to Discharge:  No Barriers Identified   Loraine Maple  [phone] 04/28/2016, 3:46 PM

## 2016-04-28 NOTE — Care Management Note (Signed)
Case Management Note  Patient Details  Name: Valerie Ramirez MRN: [id number] Date of Birth: 07/30/1932  Subjective/Objective:                  Left hip pain   UTI (urinary tract infection) Action/Plan: Discharge planning Expected Discharge Date:                  Expected Discharge Plan:  [place]  In-House Referral:     Discharge planning Services  CM Consult  Post Acute Care Choice:  NA Choice offered to:  NA  DME Arranged:  [name] DME Agency:  NA  HH Arranged:  NA HH Agency:  NA  Status of Service:  Completed, signed off  If discussed at [place] of Stay Meetings, dates discussed:    Additional Comments: CM notes pt to go to SNF upon discharge; CSW aware and arranging. NO other CM needs were communicated. Dellie Catholic, RN 04/28/2016, 11:56 AM

## 2016-04-29 DIAGNOSIS — R5381 Other malaise: Secondary | ICD-10-CM | POA: Diagnosis not present

## 2016-04-29 DIAGNOSIS — M79605 Pain in left leg: Secondary | ICD-10-CM | POA: Diagnosis not present

## 2016-04-29 DIAGNOSIS — G9340 Encephalopathy, unspecified: Secondary | ICD-10-CM | POA: Diagnosis not present

## 2016-04-29 DIAGNOSIS — Z9181 History of falling: Secondary | ICD-10-CM | POA: Diagnosis not present

## 2016-04-29 DIAGNOSIS — R259 Unspecified abnormal involuntary movements: Secondary | ICD-10-CM | POA: Diagnosis not present

## 2016-04-29 DIAGNOSIS — N390 Urinary tract infection, site not specified: Secondary | ICD-10-CM | POA: Diagnosis not present

## 2016-04-29 DIAGNOSIS — R2681 Unsteadiness on feet: Secondary | ICD-10-CM | POA: Diagnosis not present

## 2016-04-29 DIAGNOSIS — R2689 Other abnormalities of gait and mobility: Secondary | ICD-10-CM | POA: Diagnosis not present

## 2016-04-29 DIAGNOSIS — E860 Dehydration: Secondary | ICD-10-CM | POA: Diagnosis not present

## 2016-04-29 DIAGNOSIS — R279 Unspecified lack of coordination: Secondary | ICD-10-CM | POA: Diagnosis not present

## 2016-04-29 DIAGNOSIS — N179 Acute kidney failure, unspecified: Secondary | ICD-10-CM | POA: Diagnosis not present

## 2016-04-29 DIAGNOSIS — R41841 Cognitive communication deficit: Secondary | ICD-10-CM | POA: Diagnosis not present

## 2016-04-29 DIAGNOSIS — M6281 Muscle weakness (generalized): Secondary | ICD-10-CM | POA: Diagnosis not present

## 2016-04-29 DIAGNOSIS — I10 Essential (primary) hypertension: Secondary | ICD-10-CM | POA: Diagnosis not present

## 2016-04-29 DIAGNOSIS — G308 Other Alzheimer's disease: Secondary | ICD-10-CM | POA: Diagnosis not present

## 2016-04-29 LAB — BASIC METABOLIC PANEL
Anion gap: 11 (ref 5–15)
BUN: 24 mg/dL — ABNORMAL HIGH (ref 6–20)
CO2: 23 mmol/L (ref 22–32)
Calcium: 8.9 mg/dL (ref 8.9–10.3)
Chloride: 104 mmol/L (ref 101–111)
Creatinine, Ser: 1.1 mg/dL — ABNORMAL HIGH (ref 0.44–1.00)
GFR calc Af Amer: 53 mL/min — ABNORMAL LOW (ref >60)
GFR calc non Af Amer: 46 mL/min — ABNORMAL LOW (ref >60)
Glucose, Bld: 105 mg/dL — ABNORMAL HIGH (ref 65–99)
Potassium: 3.5 mmol/L (ref 3.5–5.1)
Sodium: 138 mmol/L (ref 135–145)

## 2016-04-29 LAB — CBC
HCT: 33.5 % — ABNORMAL LOW (ref 36.0–46.0)
Hemoglobin: 10.9 g/dL — ABNORMAL LOW (ref 12.0–15.0)
MCH: 26.4 pg (ref 26.0–34.0)
MCHC: 32.5 g/dL (ref 30.0–36.0)
MCV: 81.1 fL (ref 78.0–100.0)
Platelets: 280 10*3/uL (ref 150–400)
RBC: 4.13 MIL/uL (ref 3.87–5.11)
RDW: 16.8 % — ABNORMAL HIGH (ref 11.5–15.5)
WBC: 11.5 10*3/uL — ABNORMAL HIGH (ref 4.0–10.5)

## 2016-04-29 LAB — MAGNESIUM: Magnesium: 2.1 mg/dL (ref 1.7–2.4)

## 2016-04-29 MED ORDER — LOPERAMIDE HCL 2 MG PO CAPS
4.0000 mg | ORAL_CAPSULE | Freq: Once | ORAL | Status: AC
  Administered 2016-04-29: 4 mg via ORAL
  Filled 2016-04-29: qty 2

## 2016-04-29 MED ORDER — POTASSIUM CHLORIDE CRYS ER 20 MEQ PO TBCR
40.0000 meq | EXTENDED_RELEASE_TABLET | Freq: Once | ORAL | Status: AC
  Administered 2016-04-29: 40 meq via ORAL
  Filled 2016-04-29: qty 2

## 2016-04-29 MED ORDER — AMLODIPINE BESYLATE 10 MG PO TABS
10.0000 mg | ORAL_TABLET | Freq: Every day | ORAL | Status: DC
  Administered 2016-04-29: 10 mg via ORAL
  Filled 2016-04-29: qty 1

## 2016-04-29 MED ORDER — CEFUROXIME AXETIL 250 MG PO TABS: 250.0000 mg | ORAL_TABLET | Freq: Two times a day (BID) | ORAL | 0 refills | Status: AC

## 2016-04-29 MED ORDER — LOPERAMIDE HCL 2 MG PO CAPS: 2.0000 mg | ORAL_CAPSULE | ORAL | 0 refills | Status: DC | PRN

## 2016-04-29 MED ORDER — LISINOPRIL 5 MG PO TABS
5.0000 mg | ORAL_TABLET | Freq: Every day | ORAL | Status: DC
  Administered 2016-04-29: 5 mg via ORAL
  Filled 2016-04-29: qty 1

## 2016-04-29 MED ORDER — AMLODIPINE BESYLATE 10 MG PO TABS: 10.0000 mg | ORAL_TABLET | Freq: Every day | ORAL | 2 refills | Status: DC

## 2016-04-29 MED ORDER — LOPERAMIDE HCL 2 MG PO CAPS: 2.0000 mg | ORAL_CAPSULE | ORAL | Status: DC | PRN

## 2016-04-29 MED ORDER — HYDROCODONE-ACETAMINOPHEN 5-325 MG PO TABS: 1.0000 | ORAL_TABLET | Freq: Four times a day (QID) | ORAL | 0 refills | Status: DC | PRN

## 2016-04-29 MED ORDER — ENOXAPARIN SODIUM 40 MG/0.4ML ~~LOC~~ SOLN: 40.0000 mg | SUBCUTANEOUS | Status: DC

## 2016-04-29 MED ORDER — VITAMIN B-12 1000 MCG PO TABS
1000.0000 ug | ORAL_TABLET | Freq: Every day | ORAL | Status: DC
  Administered 2016-04-29: 1000 ug via ORAL
  Filled 2016-04-29: qty 1

## 2016-04-29 NOTE — Progress Notes (Signed)
Physical Therapy Treatment Patient Details Name: Valerie Ramirez MRN: [id number] DOB: 10-16-1932 Today's Date: 04/29/2016    History of Present Illness Valerie Ramirez is a 83 y.o. female with medical history significant of HTN, cardiomyopathy Hypothyroidism arthritis, GERD, history of endometrial CA. adm d/t inability to walk, c/o LLE pain;pt fell approximately 1 week prior to adm  after husband lost his balance, she tried to assist him, and then he fell on top of her; notes indicated radial fx, knee xrays negative    PT Comments    Pt feeling better today and assisted to standing position x3.  Pt reports her spouse is at [facility] now, and she plans to d/c there for rehab.  Follow Up Recommendations  SNF     Equipment Recommendations  None recommended by PT    Recommendations for Other Services       Precautions / Restrictions Precautions Precautions: Fall Required Braces or Orthoses: Other Brace/Splint Other Brace/Splint: right wrist splint--notes state "recent radius fx" no further orders--ED notes states Closed nondisplaced fracture of head of right radius and right wrist sprain    Mobility  Bed Mobility Overal bed mobility: Needs Assistance Bed Mobility: Supine to Sit;Sit to Supine     Supine to sit: Min guard;HOB elevated Sit to supine: Supervision   General bed mobility comments: increased time however pt able to better assist herself today  Transfers Overall transfer level: Needs assistance Equipment used: Rolling walker (2 wheeled) Transfers: Sit to/from Stand Sit to Stand: Min assist         General transfer comment: verbal cues for technique, initial assist to rise and steady, performed x3, able to take a few steps up [place] on last stand, unable to maintain standing long due to knee pain - no R UE pain per pt  Ambulation/Gait                 Stairs            Wheelchair Mobility    Modified Rankin (Stroke Patients Only)       Balance                                     Cognition Arousal/Alertness: Awake/alert Behavior During Therapy: WFL for tasks assessed/performed Overall Cognitive Status: Within Functional Limits for tasks assessed                      Exercises General Exercises - Lower Extremity Long Arc Quad: AROM;Both;20 reps;Seated    General Comments        Pertinent Vitals/Pain Pain Assessment: Faces Faces Pain Scale: Hurts little more Pain Location: R medial knee Pain Descriptors / Indicators: Sore Pain Intervention(s): Monitored during session;Limited activity within patient's tolerance;Other (comment) (pt reports knee felt better after AROM)    Home Living                      Prior Function            PT Goals (current goals can now be found in the care plan section) Progress towards PT goals: Progressing toward goals    Frequency    Min 3X/week      PT Plan Current plan remains appropriate    Co-evaluation             End of Session Equipment Utilized During Treatment: Gait belt (pt refused  to wear R wrist splint, denies pain) Activity Tolerance: Patient tolerated treatment well Patient left: in bed;with call bell/phone within reach;with bed alarm set     Time: [id number] PT Time Calculation (min) (ACUTE ONLY): 19 min  Charges:  $Therapeutic Activity: 8-22 mins                    G Codes:      Annice Jolly,KATHrine E 05-07-16, 2:09 PM Carmelia Bake, PT, DPT 05-07-2016 Pager: [phone]

## 2016-04-29 NOTE — Discharge Summary (Signed)
Physician Discharge Summary  Valerie Ramirez [id number] DOB: Jul 12, 1932 DOA: 04/25/2016  PCP: Loura Pardon, MD  Admit date: 04/25/2016 Discharge date: 04/29/2016  Time spent: 65 minutes  Recommendations for Outpatient Follow-up:  1. Follow-up with M.D. at skilled nursing facility. Patient will need a basic metabolic profile done in 1 week to follow-up on electrolytes and renal function. Patient blood pressure need to be reassessed and antihypertensive medications adjusted appropriately as needed.   Discharge Diagnoses:  Principal Problem:   AKI (acute kidney injury) ([place]) Active Problems:   UTI due to Klebsiella species   Essential hypertension   Left hip pain   Hyponatremia   Dehydration   Leucocytosis   Acute encephalopathy   Leg edema, right   Left leg pain   Discharge Condition: Stable and improved  Diet recommendation: Regular  Filed Weights   04/25/16 1923 04/28/16 0500  Weight: 104.3 kg (230 lb) 72.3 kg (159 lb 4.8 oz)    History of present illness:  Per Dr Hermenia Bers is a 83 y.o. female with medical history significant of HTN, cardiomyopathy  Hypothyroidism arthritis, GERD, history of endometrial   cancer  Presented with severe right shoulder and left knee pain after patient had sustained a fall 1 week ago her husband fell on top of her and topled her down she was attempted to catch his fall. She landed on her abdomen and right shoulder she has had significant chest pain, right wrist pain after she attempted to lift her husband out of chair and left knee pain associated with trouble ambulating secondary to diffuse pain in for this in emergency department 2 days ago. He was found to have a radial head fracture and was placed in  splint with follow-up with orthopedics. Chest tenderness have not improved she had not had any shortness of breath no nausea vomiting she had had some foul-smelling urine with Orange collar not associated any fevers or chills  Examination now mainly concerned that she is unable to bear any weight on her left knee.  Family stated she had been in a lot of pain she had been drinking water but no food.   Regarding pertinent Chronic problems: Of note patient is status post left knee replacement In May patient had neck fusion reported decreased use of her arms at baseline  IN ER:  Temp (24hrs), Avg:97.7 F (36.5 C), Min:97.3 F (36.3 C), Max:98.1 F (36.7 C)      RR 20 satting 92% HR 81 BP 164/62 Sodium 132 which is down from baseline creatinine 1.93 which is up from baseline 1.2 WBC 15.2 hemoglobin 9.7 which is close to baseline She was found to have UTI Extensive imaging including hip and pelvis plain films that showed no fracture  CT left knee- no fracture   Hospital Course:  #1 acute kidney injury Likely secondary to prerenal azotemia in the setting of ACE inhibitor. Patient's ACE inhibitor was held patient was hydrated IV fluids with improvement in renal function such that by day of discharge patient's creatinine was down to 1.10 from 1.93 from day of admission. Patient's ACE inhibitor was resumed for better blood pressure control. Patient will need outpatient follow-up in terms of his renal function. If renal function worsens ACE inhibitor needs to be discontinued indefinitely.   #2 Klebsiella pneumoniae UTI Patient initially placed on IV Rocephin while awaiting urine cultures. Urine cultures came back positive for Klebsiella pneumonia which was sensitive to the cephalosporins. Transitioned to oral Ceftin to complete course of  antibiotic treatment.  #3 hypertension Initially on admission patient's ACE inhibitor was discontinued secondary to acute renal failure. Patient was maintained on home regimen of metoprolol, Cardura and Norvasc. Patient's blood pressure remained elevated and Norvasc dose was increased to 10 mg daily. As patient's renal function improved patient was resumed back on a home regimen of  lisinopril 5 mg daily bit better blood pressure control. Outpatient follow-up.   #4 hyponatremia Resolved with hydration.  #5 dehydration Patient noted to be dehydrated on admission. Patient was hydrated with IV fluids and was euvolemic by day of discharge.   #6 left knee pain/ambulatory dysfunction Plain films of the right knee and CT of the left knee negative for any acute abnormalities. Lower extremity Dopplers negative. Patient has been seen by physical therapy recommended skilled nursing facility.  #7 leukocytosis Felt initially to be secondary to Klebsiella pneumoniae UTI. WBC trended down. Patient with no respiratory symptoms. Patient remained afebrile. Patient noted to have multiple loose stools per nurse and and as such C. difficile PCR was checked which came back negative. Patient was transitioned to oral Ceftin number discharged on 4 more days of oral antibiotics.  #8 diarrhea Per nursing. C. difficile PCR was negative. Patient was placed on Imodium as needed.   Procedures:  CT left knee 04/25/2016  Plain films of the left knee 04/25/2016  Plain films of the left hip and pelvis 04/25/2016  Lower extremity Dopplers 04/26/2016  Consultations:  None  Discharge Exam: Vitals:   04/29/16 1149 04/29/16 1416  BP: (!) 174/68 (!) 150/67  Pulse: 82 94  Resp:  18  Temp:  98 F (36.7 C)    General: NAD Cardiovascular: RRR Respiratory: CTAB  Discharge Instructions   Discharge Instructions    Diet general    Complete by:  As directed    Increase activity slowly    Complete by:  As directed      Current Discharge Medication List    START taking these medications   Details  cefUROXime (CEFTIN) 250 MG tablet Take 1 tablet (250 mg total) by mouth 2 (two) times daily with a meal. Qty: 8 tablet, Refills: 0    enoxaparin (LOVENOX) 40 MG/0.4ML injection Inject 0.4 mLs (40 mg total) into the skin daily. Qty: 0 Syringe    loperamide (IMODIUM) 2 MG capsule  Take 1 capsule (2 mg total) by mouth as needed for diarrhea or loose stools. Qty: 20 capsule, Refills: 0      CONTINUE these medications which have CHANGED   Details  amLODipine (NORVASC) 10 MG tablet Take 1 tablet (10 mg total) by mouth daily. Qty: 30 tablet, Refills: 2    HYDROcodone-acetaminophen (NORCO/VICODIN) 5-325 MG tablet Take 1-2 tablets by mouth every 6 (six) hours as needed. Qty: 20 tablet, Refills: 0      CONTINUE these medications which have NOT CHANGED   Details  Aspirin-Caffeine (BAYER BACK & BODY PO) Take 2 tablets by mouth every 4 (four) hours as needed (back pain).     colchicine (COLCRYS) 0.6 MG tablet TAKE 1 TABLET (0.6 MG TOTAL) BY MOUTH 2 (TWO) TIMES DAILY. WITH FOOD AS NEEDED FOR GOUT Qty: 30 tablet, Refills: 3    doxazosin (CARDURA) 8 MG tablet TAKE 1 TABLET (8 MG TOTAL) BY MOUTH AT BEDTIME. Qty: 90 tablet, Refills: 3    lisinopril (PRINIVIL,ZESTRIL) 5 MG tablet TAKE 1 TABLET (5 MG TOTAL) BY MOUTH DAILY. Qty: 90 tablet, Refills: 3    metoprolol (LOPRESSOR) 100 MG tablet TAKE 1 TABLET (  100 MG TOTAL) BY MOUTH 2 (TWO) TIMES DAILY. Qty: 180 tablet, Refills: 3    vitamin B-12 (CYANOCOBALAMIN) 1000 MCG tablet Take 1,000 mcg by mouth daily.      STOP taking these medications     DULoxetine (CYMBALTA) 20 MG capsule        Allergies  Allergen Reactions  . Allopurinol Nausea Only  . Amoxicillin-Pot Clavulanate     REACTION: stomach upset  . Lipitor [Atorvastatin Calcium] Other (See Comments)    Leg ache and ache all over  . Simvastatin     REACTION: muscle pain  . Valium [Diazepam] Other (See Comments)    "drives me crazy"  . Tape Itching and Rash    "Cannot use paper tape" --- also causes blisters. Use plastic tape   Follow-up Information    MD AT SNF Follow up.   Why:  F/U WITH MD AT SNF           The results of significant diagnostics from this hospitalization (including imaging, microbiology, ancillary and laboratory) are listed below  for reference.    Significant Diagnostic Studies: Dg Elbow Complete Right  Result Date: 04/23/2016 CLINICAL DATA:  Injury to right arm with pain.  Initial encounter. EXAM: RIGHT ELBOW - COMPLETE 3+ VIEW COMPARISON:  04/10/2013 FINDINGS: There is evidence a joint effusion. There may be a subtle lateral radial head fracture present which appears nondisplaced. Osteoarthritis of the elbow present. IMPRESSION: Joint effusion with potential subtle nondisplaced radial head fracture. Electronically Signed   By: Aletta Edouard M.D.   On: 04/23/2016 16:17   Dg Wrist Complete Right  Result Date: 04/23/2016 CLINICAL DATA:  Injury and tenderness in the right hand and wrist. EXAM: RIGHT WRIST - COMPLETE 3+ VIEW COMPARISON:  12/23/2008 FINDINGS: There are degenerative changes at the distal radioulnar joint. The right wrist is located without an acute fracture. Difficult to exclude soft tissue swelling. Mild widening between the scaphoid and lunate appears similar to the previous examination. No acute fracture involving the carpal bones. IMPRESSION: No acute abnormality. Degenerative changes at the right wrist. Electronically Signed   By: Markus Daft M.D.   On: 04/23/2016 16:19   Ct Knee Left Wo Contrast  Result Date: 04/25/2016 CLINICAL DATA:  Evaluate for fracture. Radiographs demonstrate a joint effusion with no convincing fracture. Trauma was 2 days ago. EXAM: CT OF THE LEFT KNEE WITHOUT CONTRAST TECHNIQUE: Multidetector CT imaging of the LEFT knee was performed according to the standard protocol. Multiplanar CT image reconstructions were also generated. COMPARISON:  Current left knee radiographs. FINDINGS: Study mildly degraded by motion and the artifact from the knee prosthesis. Bones/Joint/Cartilage No fracture.  No bone lesion. The knee prosthetic components appear well seated and well aligned with no evidence of loosening. Ligaments Suboptimally assessed by CT. Muscles and Tendons Grossly unremarkable allowing  for artifact from the knee prosthesis. Soft tissues Small joint effusion.  No soft tissue hematoma. IMPRESSION: 1. No CT evidence of a fracture. No evidence of loosening of the orthopedic hardware. 2. Small joint effusion. Electronically Signed   By: Lajean Manes M.D.   On: 04/25/2016 18:33   Dg Knee Complete 4 Views Left  Result Date: 04/25/2016 CLINICAL DATA:  Pain along the anteromedial left knee after trauma 2 days ago. EXAM: LEFT KNEE - COMPLETE 4+ VIEW COMPARISON:  None. FINDINGS: The patient is status post left knee replacement. No evidence of hardware failure. There is a small joint effusion. No acute fractures identified. IMPRESSION: Small joint effusion after  trauma. No identified fracture. No evidence of hardware failure. Electronically Signed   By: Dorise Bullion III M.D   On: 04/25/2016 14:12   Dg Hand Complete Right  Result Date: 04/23/2016 CLINICAL DATA:  Right hand pain after status fell on patient. EXAM: RIGHT HAND - COMPLETE 3+ VIEW COMPARISON:  None. FINDINGS: Osteoarthritis of the second through fifth DIP and PIP joints, interphalangeal joint of the thumb, first MCP and CMC articulations as well as distal radioulnar and radiocarpal joints. No acute fracture. Chondrocalcinosis of the triangular fibrocartilage complex. Degenerative cysts of the carpal bones and base of ulnar as well as radial styloids. IMPRESSION: No acute osseous abnormality. Findings suggest osteoarthritis of the hand, wrist and distal forearm. Electronically Signed   By: Ashley Royalty M.D.   On: 04/23/2016 16:17   Dg Foot Complete Right  Result Date: 04/23/2016 CLINICAL DATA:  Tenderness, status post injury EXAM: RIGHT FOOT COMPLETE - 3+ VIEW COMPARISON:  None. FINDINGS: Generalized osteopenia. No acute fracture or dislocation. Mild osteoarthritis of the first MTP joint. Mild osteoarthritis of the navicular - cuneiform joint. At small plantar calcaneal spur. No soft tissue abnormality. IMPRESSION: No acute osseous  injury of the right foot. Electronically Signed   By: Kathreen Devoid   On: 04/23/2016 16:15   Dg Hip Unilat W Or Wo Pelvis 2-3 Views Left  Result Date: 04/25/2016 CLINICAL DATA:  Acute onset of generalized left leg pain. Initial encounter. EXAM: DG HIP (WITH OR WITHOUT PELVIS) 2-3V LEFT COMPARISON:  Left hip radiographs performed 08/12/2014 FINDINGS: There is no evidence of fracture or dislocation. Both femoral heads are seated normally within their respective acetabula. The proximal left femur appears intact. No significant degenerative change is appreciated. The sacroiliac joints are unremarkable in appearance. The visualized bowel gas pattern is grossly unremarkable in appearance. IMPRESSION: No evidence of fracture or dislocation. Electronically Signed   By: Garald Balding M.D.   On: 04/25/2016 18:53   Dg Hip Unilat W Or Wo Pelvis 2-3 Views Right  Result Date: 04/23/2016 CLINICAL DATA:  Pt spouse fell on her on Monday. She attempted to catch him. Family landed on her abdomen to rt shoulder. Pt states since then she has had pain across chest to rt arm and flank. Worse with movement. Unable to rotate shoulder.*comment was truncated* EXAM: DG HIP (WITH OR WITHOUT PELVIS) 2-3V RIGHT COMPARISON:  None. FINDINGS: Hips are located. No evidence of pelvic fracture or sacral fracture. Dedicated view of the RIGHT hip demonstrates no femoral neck fracture. IMPRESSION: No pelvic fracture or hip fracture. Electronically Signed   By: Suzy Bouchard M.D.   On: 04/23/2016 16:13    Microbiology: Recent Results (from the past 240 hour(s))  Urine culture     Status: Abnormal   Collection Time: 04/25/16  5:04 PM  Result Value Ref Range Status   Specimen Description URINE, RANDOM  Final   Special Requests NONE  Final   Culture >=100,000 COLONIES/mL KLEBSIELLA PNEUMONIAE (A)  Final   Report Status 04/28/2016 FINAL  Final   Organism ID, Bacteria KLEBSIELLA PNEUMONIAE (A)  Final      Susceptibility   Klebsiella  pneumoniae - MIC*    AMPICILLIN 8 RESISTANT Resistant     CEFAZOLIN <=4 SENSITIVE Sensitive     CEFTRIAXONE <=1 SENSITIVE Sensitive     CIPROFLOXACIN <=0.25 SENSITIVE Sensitive     GENTAMICIN <=1 SENSITIVE Sensitive     IMIPENEM <=0.25 SENSITIVE Sensitive     NITROFURANTOIN 64 INTERMEDIATE Intermediate  TRIMETH/SULFA <=20 SENSITIVE Sensitive     AMPICILLIN/SULBACTAM <=2 SENSITIVE Sensitive     PIP/TAZO <=4 SENSITIVE Sensitive     Extended ESBL NEGATIVE Sensitive     * >=100,000 COLONIES/mL KLEBSIELLA PNEUMONIAE  C difficile quick scan w PCR reflex     Status: None   Collection Time: 04/28/16  3:50 PM  Result Value Ref Range Status   C Diff antigen NEGATIVE NEGATIVE Final   C Diff toxin NEGATIVE NEGATIVE Final   C Diff interpretation No C. difficile detected.  Final     Labs: Basic Metabolic Panel:  Recent Labs Lab 04/25/16 2143 04/26/16 0442 04/27/16 0511 04/28/16 0509 04/29/16 0434  NA 132* 137 137 137 138  K 3.7 3.5 3.6 3.8 3.5  CL 100* 103 105 106 104  CO2 21* 21* 22 21* 23  GLUCOSE 103* 100* 104* 121* 105*  BUN 51* 47* 40* 30* 24*  CREATININE 1.93* 1.79* 1.50* 1.29* 1.10*  CALCIUM 8.4* 8.6* 8.6* 8.9 8.9  MG  --  2.1  --   --  2.1  PHOS  --  4.8*  --   --   --    Liver Function Tests:  Recent Labs Lab 04/25/16 2143 04/26/16 0442  AST 27 23  ALT 27 27  ALKPHOS 111 110  BILITOT 1.0 0.9  PROT 6.8 6.6  ALBUMIN 3.2* 3.0*   No results for input(s): LIPASE, AMYLASE in the last 168 hours. No results for input(s): AMMONIA in the last 168 hours. CBC:  Recent Labs Lab 04/25/16 2143 04/26/16 0442 04/27/16 0511 04/28/16 0509 04/29/16 0434  WBC 15.2* 14.2* 11.9* 13.0* 11.5*  NEUTROABS 12.1*  --  8.7* 9.4*  --   HGB 9.7* 10.0* 9.7* 10.5* 10.9*  HCT 30.5* 31.0* 31.0* 32.7* 33.5*  MCV 83.3 83.3 84.0 83.0 81.1  PLT 227 236 233 243 280   Cardiac Enzymes: No results for input(s): CKTOTAL, CKMB, CKMBINDEX, TROPONINI in the last 168 hours. BNP: BNP (last 3  results) No results for input(s): BNP in the last 8760 hours.  ProBNP (last 3 results) No results for input(s): PROBNP in the last 8760 hours.  CBG: No results for input(s): GLUCAP in the last 168 hours.     SignedIrine Seal MD.  [facility] 04/29/2016, 3:11 PM

## 2016-04-29 NOTE — Clinical Social Work Placement (Signed)
   CLINICAL SOCIAL WORK PLACEMENT  NOTE  Date:  04/29/2016  Patient Details  Name: Valerie Ramirez MRN: [id number] Date of Birth: [birth year]  Clinical Social Work is seeking post-discharge placement for this patient at the [place] level of care (*CSW will initial, date and re-position this form in  chart as items are completed):  Yes   Patient/family provided with [facility] Work Department's list of facilities offering this level of care within the geographic area requested by the patient (or if unable, by the patient's family).  Yes   Patient/family informed of their freedom to choose among providers that offer the needed level of care, that participate in Medicare, Medicaid or managed care program needed by the patient, have an available bed and are willing to accept the patient.  Yes   Patient/family informed of [facility]'s ownership interest in [facility] and [facility], as well as of the fact that they are under no obligation to receive care at these facilities.  PASRR submitted to EDS on 04/28/16     PASRR number received on 04/28/16     Existing PASRR number confirmed on       FL2 transmitted to all facilities in geographic area requested by pt/family on 04/28/16     FL2 transmitted to all facilities within larger geographic area on       Patient informed that his/her managed care company has contracts with or will negotiate with certain facilities, including the following:        Yes   Patient/family informed of bed offers received.  Patient chooses bed at [place] [place]     Physician recommends and patient chooses bed at      Patient to be transferred to [place] [place] on 04/29/16.  Patient to be transferred to facility by [facility]     Patient family notified on 04/29/16 of transfer.  Name of family member notified:  DAUGHTER     PHYSICIAN       Additional Comment: Pt / family are in agreement with  d/c to [facility] ( [facility] ) today. [facility] transport is needed. Medical necessity form completed. Pt / daughter are aware out of pocket costs may be associated with [facility] transport. D/C Summary sent to SNF for review. Scripts included in d/c packet. # for report provided to nsg.   _______________________________________________ Luretha Rued, [place]  [phone] 04/29/2016, 3:30 PM

## 2016-04-29 NOTE — Care Management Important Message (Signed)
Important Message  Patient Details  Name: Valerie Ramirez MRN: [id number] Date of Birth: 12-29-1932   Medicare Important Message Given:  Yes    Kerin Salen 04/29/2016, 2:40 [place] Message  Patient Details  Name: Valerie Ramirez MRN: [id number] Date of Birth: [birth year]   Medicare Important Message Given:  Yes    Kerin Salen 04/29/2016, 2:40 PM

## 2016-04-30 DIAGNOSIS — R5381 Other malaise: Secondary | ICD-10-CM | POA: Diagnosis not present

## 2016-04-30 DIAGNOSIS — G308 Other Alzheimer's disease: Secondary | ICD-10-CM | POA: Diagnosis not present

## 2016-04-30 DIAGNOSIS — R2689 Other abnormalities of gait and mobility: Secondary | ICD-10-CM | POA: Diagnosis not present

## 2016-04-30 DIAGNOSIS — I10 Essential (primary) hypertension: Secondary | ICD-10-CM | POA: Diagnosis not present

## 2016-04-30 DIAGNOSIS — N390 Urinary tract infection, site not specified: Secondary | ICD-10-CM | POA: Diagnosis not present

## 2016-05-01 ENCOUNTER — Telehealth: Payer: Self-pay | Admitting: Family Medicine

## 2016-05-01 DIAGNOSIS — I10 Essential (primary) hypertension: Secondary | ICD-10-CM

## 2016-05-01 DIAGNOSIS — E039 Hypothyroidism, unspecified: Secondary | ICD-10-CM

## 2016-05-01 DIAGNOSIS — R739 Hyperglycemia, unspecified: Secondary | ICD-10-CM

## 2016-05-01 NOTE — Telephone Encounter (Signed)
-----   Message from Eustace Pen, LPN sent at [id number]  5:32 PM EST ----- Regarding: Lab Orders 05/05/16 Please place lab orders, if any. Thank you.

## 2016-05-05 ENCOUNTER — Ambulatory Visit: Payer: Self-pay

## 2016-05-12 ENCOUNTER — Ambulatory Visit: Payer: Self-pay | Admitting: Family Medicine

## 2016-05-20 ENCOUNTER — Other Ambulatory Visit: Payer: Self-pay | Admitting: Family Medicine

## 2016-05-20 NOTE — Telephone Encounter (Signed)
Please call and check with her about that , thanks

## 2016-05-20 NOTE — Telephone Encounter (Signed)
Chart says med was d/c on 04/23/16 when pt was in the hospital, please advise

## 2016-05-21 NOTE — Telephone Encounter (Signed)
Will refill electronically  

## 2016-05-21 NOTE — Telephone Encounter (Signed)
Pt said she had stopped for a little while before she was in the hospital but since coming home she has restarted taking med and needs a refill, please advise

## 2016-05-24 ENCOUNTER — Telehealth: Payer: Self-pay | Admitting: Family Medicine

## 2016-05-24 NOTE — Telephone Encounter (Signed)
[facility] called -  Pt was recently discharged from [facility].   [facility] will go to pt's home tomorrow to do their assessment. No cb needed Thank you

## 2016-05-24 NOTE — Telephone Encounter (Signed)
Appreciate the update 

## 2016-06-01 ENCOUNTER — Encounter: Payer: Self-pay | Admitting: Family Medicine

## 2016-06-01 ENCOUNTER — Ambulatory Visit (INDEPENDENT_AMBULATORY_CARE_PROVIDER_SITE_OTHER): Payer: Medicare Other | Admitting: Family Medicine

## 2016-06-01 VITALS — BP 130/70 | HR 52 | Temp 97.7°F | Ht 62.5 in | Wt 231.5 lb

## 2016-06-01 DIAGNOSIS — Z8781 Personal history of (healed) traumatic fracture: Secondary | ICD-10-CM

## 2016-06-01 DIAGNOSIS — M1A9XX0 Chronic gout, unspecified, without tophus (tophi): Secondary | ICD-10-CM

## 2016-06-01 DIAGNOSIS — C541 Malignant neoplasm of endometrium: Secondary | ICD-10-CM

## 2016-06-01 DIAGNOSIS — Z8679 Personal history of other diseases of the circulatory system: Secondary | ICD-10-CM

## 2016-06-01 DIAGNOSIS — E871 Hypo-osmolality and hyponatremia: Secondary | ICD-10-CM

## 2016-06-01 DIAGNOSIS — N179 Acute kidney failure, unspecified: Secondary | ICD-10-CM | POA: Diagnosis not present

## 2016-06-01 DIAGNOSIS — D72829 Elevated white blood cell count, unspecified: Secondary | ICD-10-CM | POA: Diagnosis not present

## 2016-06-01 DIAGNOSIS — I10 Essential (primary) hypertension: Secondary | ICD-10-CM

## 2016-06-01 DIAGNOSIS — H353131 Nonexudative age-related macular degeneration, bilateral, early dry stage: Secondary | ICD-10-CM | POA: Diagnosis not present

## 2016-06-01 DIAGNOSIS — Z9181 History of falling: Secondary | ICD-10-CM

## 2016-06-01 LAB — COMPREHENSIVE METABOLIC PANEL
ALT: 11 U/L (ref 0–35)
AST: 13 U/L (ref 0–37)
Albumin: 3.6 g/dL (ref 3.5–5.2)
Alkaline Phosphatase: 92 U/L (ref 39–117)
BUN: 22 mg/dL (ref 6–23)
CO2: 26 mEq/L (ref 19–32)
Calcium: 9 mg/dL (ref 8.4–10.5)
Chloride: 109 mEq/L (ref 96–112)
Creatinine, Ser: 1.13 mg/dL (ref 0.40–1.20)
GFR: 49.02 mL/min — ABNORMAL LOW (ref >60.00)
Glucose, Bld: 104 mg/dL — ABNORMAL HIGH (ref 70–99)
Potassium: 4.3 mEq/L (ref 3.5–5.1)
Sodium: 140 mEq/L (ref 135–145)
Total Bilirubin: 0.3 mg/dL (ref 0.2–1.2)
Total Protein: 6.8 g/dL (ref 6.0–8.3)

## 2016-06-01 LAB — CBC WITH DIFFERENTIAL/PLATELET
Basophils Absolute: 0.1 10*3/uL (ref 0.0–0.1)
Basophils Relative: 1.3 % (ref 0.0–3.0)
Eosinophils Absolute: 0.3 10*3/uL (ref 0.0–0.7)
Eosinophils Relative: 4.4 % (ref 0.0–5.0)
HCT: 32.4 % — ABNORMAL LOW (ref 36.0–46.0)
Hemoglobin: 10.3 g/dL — ABNORMAL LOW (ref 12.0–15.0)
Lymphocytes Relative: 32.8 % (ref 12.0–46.0)
Lymphs Abs: 2 10*3/uL (ref 0.7–4.0)
MCHC: 31.9 g/dL (ref 30.0–36.0)
MCV: 84.7 fl (ref 78.0–100.0)
Monocytes Absolute: 0.6 10*3/uL (ref 0.1–1.0)
Monocytes Relative: 10.5 % (ref 3.0–12.0)
Neutro Abs: 3.1 10*3/uL (ref 1.4–7.7)
Neutrophils Relative %: 51 % (ref 43.0–77.0)
Platelets: 194 10*3/uL (ref 150.0–400.0)
RBC: 3.82 Mil/uL — ABNORMAL LOW (ref 3.87–5.11)
RDW: 20.1 % — ABNORMAL HIGH (ref 11.5–15.5)
WBC: 6.1 10*3/uL (ref 4.0–10.5)

## 2016-06-01 NOTE — Assessment & Plan Note (Signed)
Takes colcrys  Is drinking fluids  Renal profile today

## 2016-06-01 NOTE — Progress Notes (Signed)
Subjective:    Patient ID: Valerie Ramirez, female    DOB: 22-Jul-1932, 83 y.o.   MRN: [id number]  HPI Here for f/u of hospitalization and rehabilitation   She was hospitalized from 1/7/to 04/29/16 and d/c to skilled nursing facility   She sustained injuries from a fall (husband fell on top of her) one week prior causing a R radial head fracture and knee injury Put in a sling and healed well  After this both legs hurt greatly /with swelling and went back to the ED several days later hosp for UTI due to klebsiella with AKI from dehydration causing acute encephalopathy   She was tx for uri with iv rocephin and then trans to oral ceftin  Ace held/ivf given and after imp in cr- re started  Wbc was elevated from infection and trended down  bp did bump in the interim - amlodipine was inc to 10 mg  Hyponatremia resolved with hydration   She drinks lots of water   CT of R knee was neg as was LE dopplers  That is better   She did exp some diarrhea in the hospital and then c diff test returned neg  Diarrhea is better    Lab Results  Component Value Date   WBC 11.5 (H) 04/29/2016   HGB 10.9 (L) 04/29/2016   HCT 33.5 (L) 04/29/2016   MCV 81.1 04/29/2016   PLT 280 04/29/2016     Chemistry      Component Value Date/Time   NA 138 04/29/2016 0434   K 3.5 04/29/2016 0434   CL 104 04/29/2016 0434   CO2 23 04/29/2016 0434   BUN 24 (H) 04/29/2016 0434   CREATININE 1.10 (H) 04/29/2016 0434      Component Value Date/Time   CALCIUM 8.9 04/29/2016 0434   ALKPHOS 110 04/26/2016 0442   AST 23 04/26/2016 0442   ALT 27 04/26/2016 0442   BILITOT 0.9 04/26/2016 0442       Wt Readings from Last 3 Encounters:  06/01/16 231 lb 8 oz (105 kg)  04/28/16 159 lb 4.8 oz (72.3 kg)  11/07/15 239 lb 12 oz (108.7 kg)   bmi is 41.6  BP Readings from Last 3 Encounters:  06/01/16 (!) 150/80  04/29/16 (!) 150/67  04/23/16 129/70  takes metoprolol 100 mg bid Lisinopril 5 mg qd cardura 8 mg daily   Amlodipine 10 mg daiy  Better on 2nd check BP: 130/70    At home and doing better- back to normal/doing her own housework Her husband is doing better-in therapy  She has agreed not to lift him or help him walk  He has dementia and also 5 back surgeries   Patient Active Problem List   Diagnosis Date Noted  . History of radius fracture 06/01/2016  . Hyponatremia 04/25/2016  . AKI (acute kidney injury) ([place]) 04/25/2016  . Leucocytosis 04/25/2016  . Status post cervical spinal fusion 08/18/2015  . Abrasion, lower leg, anterior 11/25/2014  . Osteoarthritis, hip, bilateral 08/12/2014  . Hearing loss due to cerumen impaction 06/12/2014  . Hearing loss in right ear 06/12/2014  . Colon cancer screening 08/10/2013  . Seborrheic keratoses, inflamed 08/10/2013  . Excessive cerumen in left ear canal 08/10/2013  . History of fall 04/10/2013  . Encounter for Medicare annual wellness exam 08/08/2012  . Gout 07/14/2012  . Endometrial cancer ([place]) 05/25/2012  . Degenerative joint disease of cervical spine 05/03/2011  . Other screening mammogram 03/31/2011  . Hyperglycemia 09/26/2007  .  Morbid obesity ([place]) 07/12/2007  . GOITER, MULTINODULAR 07/11/2007  . Essential hypertension 07/11/2007  . History of cardiomyopathy 07/11/2007  . OSTEOARTHRITIS 07/11/2007  . 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 Hypothyroidism 10/05/2006  . HYPERCHOLESTEROLEMIA, PURE 10/05/2006   Past Medical History:  Diagnosis Date  . Arthritis    OA  . Cancer ([place])    endometrial  . GERD (gastroesophageal reflux disease)   . Hyperlipidemia   . Hypertension   . Hypothyroid   . Kidney stone   . Shortness of breath dyspnea    Past Surgical History:  Procedure Laterality Date  . ANTERIOR CERVICAL DECOMP/DISCECTOMY FUSION [name] 08/18/2015   Procedure: C5-6, C6-7 Anterior Cervical Discectomy and Fusion, Allograft, Plate;  Surgeon: Marybelle Killings, MD;  Location: [facility];  Service: Orthopedics;  Laterality:  [name];  . bone spur removed Right    shoulder  . BOWEL RESECTION  07/04/2012   Procedure: SMALL BOWEL RESECTION;  Surgeon: Alvino Chapel, MD;  Location: [facility];  Service: Gynecology;;  . CHOLECYSTECTOMY    . DILATION AND CURETTAGE OF UTERUS  1999  . EYE SURGERY Bilateral    cataracts  . HAND SURGERY  12/2008   after hand fx/due to fall  . JOINT REPLACEMENT Bilateral 07/1998   knees  . KNEE ARTHROPLASTY  05/23/1998   total right  . LAPAROTOMY Bilateral 07/04/2012   Procedure: EXPLORATORY LAPAROTOMY TOTAL ABDOMINAL HYSTERECTOMY BILATERAL SALPINGO-OOPHORECTOMY with small bowel resection ;  Surgeon: Alvino Chapel, MD;  Location: [facility];  Service: Gynecology;  Laterality: Bilateral;  . polyp removal  1999  . TUBAL LIGATION     Social History  Substance Use Topics  . Smoking status: Never Smoker  . Smokeless tobacco: Never Used  . Alcohol use No   Family History  Problem Relation Age of Onset  . Cancer Brother     LEUKEMIA  . Cancer Brother     LUNG  . Cancer Brother     STOMACH  . Heart disease Sister     CABG  . Stroke Sister   . Heart disease Brother     CABG   Allergies  Allergen Reactions  . Allopurinol Nausea Only  . Amoxicillin-Pot Clavulanate     REACTION: stomach upset  . Lipitor [Atorvastatin Calcium] Other (See Comments)    Leg ache and ache all over  . Simvastatin     REACTION: muscle pain  . Valium [Diazepam] Other (See Comments)    "drives me crazy"  . Tape Itching and Rash    "Cannot use paper tape" --- also causes blisters. Use plastic tape   Current Outpatient Prescriptions on File Prior to Visit  Medication Sig Dispense Refill  . amLODipine (NORVASC) 10 MG tablet Take 1 tablet (10 mg total) by mouth daily. 30 tablet 2  . Aspirin-Caffeine (BAYER BACK & BODY PO) Take 2 tablets by mouth every 4 (four) hours as needed (back pain).     . colchicine (COLCRYS) 0.6 MG tablet TAKE 1 TABLET (0.6 MG TOTAL) BY MOUTH 2 (TWO) TIMES DAILY. WITH  FOOD AS NEEDED FOR GOUT (Patient taking differently: Take 0.6 mg by mouth 2 (two) times daily. ) 30 tablet 3  . doxazosin (CARDURA) 8 MG tablet TAKE 1 TABLET (8 MG TOTAL) BY MOUTH AT BEDTIME. 90 tablet 3  . DULoxetine (CYMBALTA) 20 MG capsule TAKE 1 CAPSULE (20 MG TOTAL) BY MOUTH DAILY. 90 capsule 3  . HYDROcodone-acetaminophen (NORCO/VICODIN) 5-325 MG tablet Take 1-2 tablets by mouth every  6 (six) hours as needed. 20 tablet 0  . lisinopril (PRINIVIL,ZESTRIL) 5 MG tablet TAKE 1 TABLET (5 MG TOTAL) BY MOUTH DAILY. 90 tablet 3  . loperamide (IMODIUM) 2 MG capsule Take 1 capsule (2 mg total) by mouth as needed for diarrhea or loose stools. 20 capsule 0  . metoprolol (LOPRESSOR) 100 MG tablet TAKE 1 TABLET (100 MG TOTAL) BY MOUTH 2 (TWO) TIMES DAILY. 180 tablet 3  . vitamin B-12 (CYANOCOBALAMIN) 1000 MCG tablet Take 1,000 mcg by mouth daily.     No current facility-administered medications on file prior to visit.     Review of Systems Review of Systems  Constitutional: Negative for fever, appetite change, fatigue and unexpected weight change.  Eyes: Negative for pain and visual disturbance.  Respiratory: Negative for cough and shortness of breath.   Cardiovascular: Negative for cp or palpitations    Gastrointestinal: Negative for nausea, diarrhea and constipation.  Genitourinary: Negative for urgency and frequency.  Skin: Negative for pallor or rash   Neurological: Negative for weakness, light-headedness, numbness and headaches.  Hematological: Negative for adenopathy. Does not bruise/bleed easily.  Psychiatric/Behavioral: Negative for dysphoric mood. The patient is not nervous/anxious.         Objective:   Physical Exam  Constitutional: She appears well-developed and well-nourished. No distress.  Morbidly obese and well appearing   HENT:  Head: Normocephalic and atraumatic.  Mouth/Throat: Oropharynx is clear and moist.  Eyes: Conjunctivae and EOM are normal. Pupils are equal, round, and  reactive to light.  Neck: Normal range of motion. Neck supple. No JVD present. Carotid bruit is not present. No thyromegaly present.  Cardiovascular: Normal rate, regular rhythm, normal heart sounds and intact distal pulses.  Exam reveals no gallop.   Pulmonary/Chest: Effort normal and breath sounds normal. No respiratory distress. She has no wheezes. She has no rales.  No crackles  Abdominal: Soft. Bowel sounds are normal. She exhibits no distension, no abdominal bruit and no mass. There is no tenderness.  Musculoskeletal: She exhibits no edema or tenderness.  Nl rom R arm s/p fracture healing  No acute gout today  Lymphadenopathy:    She has no cervical adenopathy.  Neurological: She is alert. She has normal reflexes.  Skin: Skin is warm and dry. No rash noted. No pallor.  Nl color and turgor  Psychiatric: She has a normal mood and affect.          Assessment & Plan:   Problem List Items Addressed This Visit      Cardiovascular and Mediastinum   Essential hypertension - Primary    bp was elevated in hospital in setting of infection and with brief hold of lisinopril Now trending back to normal  bp in fair control at this time  BP Readings from Last 1 Encounters:  06/01/16 130/70   No changes needed Disc lifstyle change with low sodium diet and exercise   Given dASH handout  Enc wt loss       Relevant Orders   Comprehensive metabolic panel   CBC with Differential/Platelet     Genitourinary   AKI (acute kidney injury) ([place])    With klebsiella uti/dehydration  tx with hosp with cephalosporins and fluids  Improved at d/c Lab today for renal function  Good fluid intake        Endometrial cancer ([place])    Treated 2014- total hysterectomy with SB resection        Other   Gout    Takes colcrys  Is drinking fluids  Renal profile today      History of cardiomyopathy    No symptoms       History of fall    Recent fall with radial head fracture-husb fell on  her when she tried to help him Now has more help and his status is much improved They have both had PT  Disc fall prev in detail with pt and her daughter      History of radius fracture    R radial head after a fall  Per pt healed quickly with a sling      Hyponatremia    Resolved in hospital with hydration during tx of uti  Re check today      Leucocytosis    From recent uti/hosp Re check today with resolution of infection       Relevant Orders   CBC with Differential/Platelet   Morbid obesity ([place])    Discussed how this problem influences overall health and the risks it imposes  Reviewed plan for weight loss with lower calorie diet (via better food choices and also portion control or program like weight watchers) and exercise building up to or more than 30 minutes 5 days per week including some aerobic activity   Wt loss would help endurance and bp

## 2016-06-01 NOTE — Patient Instructions (Addendum)
Make sure to get at least 64 oz of water per day  Keep up the good water intake   Walk indoors and stay active  Keep moving  Take a look at the handout on DASH eating plan   Labs today for chemistries - kidney and liver function and also blood count (red and white)

## 2016-06-01 NOTE — Assessment & Plan Note (Signed)
Discussed how this problem influences overall health and the risks it imposes  Reviewed plan for weight loss with lower calorie diet (via better food choices and also portion control or program like weight watchers) and exercise building up to or more than 30 minutes 5 days per week including some aerobic activity   Wt loss would help endurance and bp

## 2016-06-01 NOTE — Assessment & Plan Note (Signed)
Treated 2014- total hysterectomy with SB resection

## 2016-06-01 NOTE — Progress Notes (Signed)
Pre visit review using our clinic review tool, if applicable. No additional management support is needed unless otherwise documented below in the visit note. 

## 2016-06-01 NOTE — Assessment & Plan Note (Signed)
From recent uti/hosp Re check today with resolution of infection

## 2016-06-01 NOTE — Assessment & Plan Note (Signed)
Resolved in hospital with hydration during tx of uti  Re check today

## 2016-06-01 NOTE — Assessment & Plan Note (Signed)
With klebsiella uti/dehydration  tx with hosp with cephalosporins and fluids  Improved at d/c Lab today for renal function  Good fluid intake

## 2016-06-01 NOTE — Assessment & Plan Note (Signed)
No symptoms 

## 2016-06-01 NOTE — Assessment & Plan Note (Signed)
bp was elevated in hospital in setting of infection and with brief hold of lisinopril Now trending back to normal  bp in fair control at this time  BP Readings from Last 1 Encounters:  06/01/16 130/70   No changes needed Disc lifstyle change with low sodium diet and exercise   Given dASH handout  Enc wt loss

## 2016-06-01 NOTE — Assessment & Plan Note (Signed)
Recent fall with radial head fracture-husb fell on her when she tried to help him Now has more help and his status is much improved They have both had PT  Disc fall prev in detail with pt and her daughter

## 2016-06-01 NOTE — Assessment & Plan Note (Signed)
R radial head after a fall  Per pt healed quickly with a sling

## 2016-06-02 ENCOUNTER — Encounter: Payer: Self-pay | Admitting: *Deleted

## 2016-06-18 ENCOUNTER — Encounter: Payer: Self-pay | Admitting: Primary Care

## 2016-06-18 ENCOUNTER — Ambulatory Visit (INDEPENDENT_AMBULATORY_CARE_PROVIDER_SITE_OTHER): Payer: Medicare Other | Admitting: Primary Care

## 2016-06-18 ENCOUNTER — Ambulatory Visit (INDEPENDENT_AMBULATORY_CARE_PROVIDER_SITE_OTHER)
Admission: RE | Admit: 2016-06-18 | Discharge: 2016-06-18 | Disposition: A | Discharge status: Home / Self Care | Payer: Medicare Other | Source: Ambulatory Visit | Attending: Primary Care | Admitting: Primary Care

## 2016-06-18 VITALS — BP 124/70 | HR 63 | Temp 97.5°F | Ht 62.5 in | Wt 228.0 lb

## 2016-06-18 DIAGNOSIS — M25561 Pain in right knee: Secondary | ICD-10-CM

## 2016-06-18 DIAGNOSIS — G8929 Other chronic pain: Secondary | ICD-10-CM | POA: Diagnosis not present

## 2016-06-18 DIAGNOSIS — M25562 Pain in left knee: Secondary | ICD-10-CM

## 2016-06-18 DIAGNOSIS — M79641 Pain in right hand: Secondary | ICD-10-CM | POA: Diagnosis not present

## 2016-06-18 DIAGNOSIS — M170 Bilateral primary osteoarthritis of knee: Secondary | ICD-10-CM

## 2016-06-18 DIAGNOSIS — M19011 Primary osteoarthritis, right shoulder: Secondary | ICD-10-CM | POA: Diagnosis not present

## 2016-06-18 NOTE — Progress Notes (Signed)
Subjective:    Patient ID: Valerie Ramirez, female    DOB: 02/06/1933, 83 y.o.   MRN: [id number]  HPI  Valerie Ramirez is an 83 year old female with a history of osteoarthritis, UTI, DDD, bilateral knee replacements who presents today with multiple complaints.  1) Knee Pain: Located to the medial bilateral knees which became bothersome Tuesday this week. Her husband fell onto her knees in January 2018. She was holding her husband's pants behind him while he was using his walker when her husband fell back onto her. They both fell to the ground. She was admitted to the hospital on 04/29/2016 for UTI, AKI, acute encephalopathy.   She's been doing well since discharge home from nursing home rehab in late January. During her hospital say in January 2018 she underwent xray of her left knee with small effusion, otherwise unremarkable. She denies swelling, erythema, recent re-injury to her knees. She underwent bilateral total knee replacements 18 years ago. She did complete physical therapy while at the SNF which helped her knees significantly. She declined home PT after nursing rehab as she felt much improved. She would like to restart this.  She denies hematuria, urinary frequency, foul smell to her urine. Daughter denies confusion. She is doing well otherwise.  2) Diarrhea: Present since Monday afternoon this week. She also reports chills, feeling feverish, fatigue. Her diarrhea and associated symptoms stopped yesterday afternoon since her daughter gave her two Imodium tablets. She denies abdominal pain, nausea, bloody stools. She's not eaten much but has been drinking water.   3) Hand Pain: Located to right dorsal hand. Fractured four years ago, pins in place. Yesterday she noticed pain to the dorsal hand to the site of pin placement. She denies recent injury, erythema, swelling.  Review of Systems  Gastrointestinal: Positive for diarrhea. Negative for abdominal pain, blood in stool, nausea and vomiting.   Genitourinary: Negative for dysuria, frequency and hematuria.  Musculoskeletal: Positive for arthralgias. Negative for joint swelling.  Skin: Negative for color change.  Neurological: Negative for dizziness and weakness.       Past Medical History:  Diagnosis Date  . Arthritis    OA  . Cancer ([place])    endometrial  . GERD (gastroesophageal reflux disease)   . Hyperlipidemia   . Hypertension   . Hypothyroid   . Kidney stone   . Shortness of breath dyspnea      Social History   Social History  . Marital status: Married    Spouse name: [name]  . Number of children: [name]  . Years of education: [name]   Occupational History  . Not on file.   Social History Main Topics  . Smoking status: Never Smoker  . Smokeless tobacco: Never Used  . Alcohol use No  . Drug use: No  . Sexual activity: No   Other Topics Concern  . Not on file   Social History Narrative  . No narrative on file    Past Surgical History:  Procedure Laterality Date  . ANTERIOR CERVICAL DECOMP/DISCECTOMY FUSION [name] 08/18/2015   Procedure: C5-6, C6-7 Anterior Cervical Discectomy and Fusion, Allograft, Plate;  Surgeon: Marybelle Killings, MD;  Location: [facility] [place];  Service: Orthopedics;  Laterality: [name];  . bone spur removed Right    shoulder  . BOWEL RESECTION  07/04/2012   Procedure: SMALL BOWEL RESECTION;  Surgeon: Alvino Chapel, MD;  Location: [facility];  Service: Gynecology;;  . CHOLECYSTECTOMY    . DILATION AND CURETTAGE OF UTERUS  1999  . EYE SURGERY Bilateral    cataracts  . HAND SURGERY  12/2008   after hand fx/due to fall  . JOINT REPLACEMENT Bilateral 07/1998   knees  . KNEE ARTHROPLASTY  05/23/1998   total right  . LAPAROTOMY Bilateral 07/04/2012   Procedure: EXPLORATORY LAPAROTOMY TOTAL ABDOMINAL HYSTERECTOMY BILATERAL SALPINGO-OOPHORECTOMY with small bowel resection ;  Surgeon: Alvino Chapel, MD;  Location: [facility];  Service: Gynecology;  Laterality: Bilateral;  . polyp removal  1999  .  TUBAL LIGATION      Family History  Problem Relation Age of Onset  . Cancer Brother     LEUKEMIA  . Cancer Brother     LUNG  . Cancer Brother     STOMACH  . Heart disease Sister     CABG  . Stroke Sister   . Heart disease Brother     CABG    Allergies  Allergen Reactions  . Allopurinol Nausea Only  . Amoxicillin-Pot Clavulanate     REACTION: stomach upset  . Lipitor [Atorvastatin Calcium] Other (See Comments)    Leg ache and ache all over  . Simvastatin     REACTION: muscle pain  . Valium [Diazepam] Other (See Comments)    "drives me crazy"  . Tape Itching and Rash    "Cannot use paper tape" --- also causes blisters. Use plastic tape    Current Outpatient Prescriptions on File Prior to Visit  Medication Sig Dispense Refill  . amLODipine (NORVASC) 10 MG tablet Take 1 tablet (10 mg total) by mouth daily. (Patient not taking: Reported on 06/18/2016) 30 tablet 2  . Aspirin-Caffeine (BAYER BACK & BODY PO) Take 2 tablets by mouth every 4 (four) hours as needed (back pain).     . colchicine (COLCRYS) 0.6 MG tablet TAKE 1 TABLET (0.6 MG TOTAL) BY MOUTH 2 (TWO) TIMES DAILY. WITH FOOD AS NEEDED FOR GOUT (Patient not taking: Reported on 06/18/2016) 30 tablet 3  . doxazosin (CARDURA) 8 MG tablet TAKE 1 TABLET (8 MG TOTAL) BY MOUTH AT BEDTIME. (Patient not taking: Reported on 06/18/2016) 90 tablet 3  . DULoxetine (CYMBALTA) 20 MG capsule TAKE 1 CAPSULE (20 MG TOTAL) BY MOUTH DAILY. (Patient not taking: Reported on 06/18/2016) 90 capsule 3  . HYDROcodone-acetaminophen (NORCO/VICODIN) 5-325 MG tablet Take 1-2 tablets by mouth every 6 (six) hours as needed. (Patient not taking: Reported on 06/18/2016) 20 tablet 0  . lisinopril (PRINIVIL,ZESTRIL) 5 MG tablet TAKE 1 TABLET (5 MG TOTAL) BY MOUTH DAILY. (Patient not taking: Reported on 06/18/2016) 90 tablet 3  . loperamide (IMODIUM) 2 MG capsule Take 1 capsule (2 mg total) by mouth as needed for diarrhea or loose stools. (Patient not taking: Reported on  06/18/2016) 20 capsule 0  . metoprolol (LOPRESSOR) 100 MG tablet TAKE 1 TABLET (100 MG TOTAL) BY MOUTH 2 (TWO) TIMES DAILY. (Patient not taking: Reported on 06/18/2016) 180 tablet 3  . vitamin B-12 (CYANOCOBALAMIN) 1000 MCG tablet Take 1,000 mcg by mouth daily.     No current facility-administered medications on file prior to visit.     BP 124/70   Pulse 63   Temp 97.5 F (36.4 C) (Oral)   Ht 5' 2.5" (1.588 m)   Wt 228 lb (103.4 kg)   SpO2 97%   BMI 41.04 kg/m    Objective:   Physical Exam  Constitutional: She appears well-nourished. She does not appear ill.  Neck: Neck supple.  Cardiovascular: Normal rate and regular rhythm.   Pulmonary/Chest: Effort  normal and breath sounds normal.  Abdominal: Soft. Normal appearance and bowel sounds are normal. There is no tenderness.  Musculoskeletal:       Right knee: She exhibits decreased range of motion. She exhibits no swelling, no erythema and no bony tenderness. Tenderness found. Medial joint line tenderness noted.       Left knee: She exhibits decreased range of motion. She exhibits no swelling, no erythema and no bony tenderness. Tenderness found. Medial joint line tenderness noted.       Right hand: She exhibits normal range of motion, no tenderness, no bony tenderness, no deformity and no swelling.  Skin: Skin is warm and dry. No erythema.          Assessment & Plan:  Diarrhea:  Present for 3 days. Improved since 1 dose of Imodium yesterday. Tolerating PO fluids, decrease in appetite. Suspect viral involvement.  Exam today stable. Does not appear acutely dehydrated. No alarm signs.  Stressed importance of hydration, advance diet as tolerated. Information provided for [employer]. Return precautions provided.  Hand Pain:  Located to right dorsal hand x 24 hours. Exam today without s/s of infection. Good ROM. Exam unremarkable. Obtain xray today to rule out any hardware malfunction/abnormality.  Sheral Flow,  NP   Sheral Flow, NP

## 2016-06-18 NOTE — Progress Notes (Signed)
Pre visit review using our clinic review tool, if applicable. No additional management support is needed unless otherwise documented below in the visit note. 

## 2016-06-18 NOTE — Patient Instructions (Addendum)
The diarrhea is likely caused by a virus which will resolve on its own.  Refrain from using Imodium now unless the diarrhea returns.  Continue to push intake of water to stay hydrated. Advance your diet as tolerated, take a look at the information below.  Complete xray(s) prior to leaving today. I will notify you of your results once received.  You will be contacted regarding your referral to home health physical therapy.  Please let us know if you have not heard back within one week.   Please call us if you develop fevers of 101 and higher, you start vomiting and cannot keep fluids down, you start to feel very weak and tired, you notice redness and swelling to the knees.  It was a pleasure meeting you!   Food Choices to Help Relieve Diarrhea, Adult When you have diarrhea, the foods you eat and your eating habits are very important. Choosing the right foods and drinks can help:  Relieve diarrhea.  Replace lost fluids and nutrients.  Prevent dehydration. What general guidelines should I follow? Relieving diarrhea   Choose foods with less than 2 g or .07 oz. of fiber per serving.  Limit fats to less than 8 tsp (38 g or 1.34 oz.) a day.  Avoid the following:  Foods and beverages sweetened with high-fructose corn syrup, honey, or sugar alcohols such as xylitol, sorbitol, and mannitol.  Foods that contain a lot of fat or sugar.  Fried, greasy, or spicy foods.  High-fiber grains, breads, and cereals.  Raw fruits and vegetables.  Eat foods that are rich in probiotics. These foods include dairy products such as yogurt and fermented milk products. They help increase healthy bacteria in the stomach and intestines (gastrointestinal tract, or GI tract).  If you have lactose intolerance, avoid dairy products. These may make your diarrhea worse.  Take medicine to help stop diarrhea (antidiarrheal medicine) only as told by your health care provider. Replacing nutrients   Eat small  meals or snacks every 3-4 hours.  Eat bland foods, such as white rice, toast, or baked potato, until your diarrhea starts to get better. Gradually reintroduce nutrient-rich foods as tolerated or as told by your health care provider. This includes:  Well-cooked protein foods.  Peeled, seeded, and soft-cooked fruits and vegetables.  Low-fat dairy products.  Take vitamin and mineral supplements as told by your health care provider. Preventing dehydration    Start by sipping water or a special solution to prevent dehydration (oral rehydration solution, ORS). Urine that is clear or pale yellow means that you are getting enough fluid.  Try to drink at least 8-10 cups of fluid each day to help replace lost fluids.  You may add other liquids in addition to water, such as clear juice or decaffeinated sports drinks, as tolerated or as told by your health care provider.  Avoid drinks with caffeine, such as coffee, tea, or soft drinks.  Avoid alcohol. What foods are recommended? The items listed may not be a complete list. Talk with your health care provider about what dietary choices are best for you. Grains  White rice. White, [place], or pita breads (fresh or toasted), including plain rolls, buns, or bagels. White pasta. Saltine, soda, or graham crackers. Pretzels. Low-fiber cereal. Cooked cereals made with water (such as cornmeal, farina, or cream cereals). Plain muffins. Matzo. Melba toast. Zwieback. Vegetables  Potatoes (without the skin). Most well-cooked and canned vegetables without skins or seeds. Tender lettuce. Fruits  Apple sauce. Fruits canned  in juice. Cooked apricots, cherries, grapefruit, peaches, pears, or plums. Fresh bananas and cantaloupe. Meats and other protein foods  Baked or boiled chicken. Eggs. Tofu. Fish. Seafood. Smooth nut butters. Ground or well-cooked tender beef, ham, veal, lamb, pork, or poultry. Dairy  Plain yogurt, kefir, and unsweetened liquid yogurt.  Lactose-free milk, buttermilk, skim milk, or soy milk. Low-fat or nonfat hard cheese. Beverages  Water. Low-calorie sports drinks. Fruit juices without pulp. Strained tomato and vegetable juices. Decaffeinated teas. Sugar-free beverages not sweetened with sugar alcohols. Oral rehydration solutions, if approved by your health care provider. Seasoning and other foods  Bouillon, broth, or soups made from recommended foods. What foods are not recommended? The items listed may not be a complete list. Talk with your health care provider about what dietary choices are best for you. Grains  Whole grain, whole wheat, bran, or rye breads, rolls, pastas, and crackers. Wild or brown rice. Whole grain or bran cereals. Barley. Oats and oatmeal. Corn tortillas or taco shells. Granola. Popcorn. Vegetables  Raw vegetables. Fried vegetables. Cabbage, broccoli, Brussels sprouts, artichokes, baked beans, beet greens, corn, kale, legumes, peas, sweet potatoes, and yams. Potato skins. Cooked spinach and cabbage. Fruits  Dried fruit, including raisins and dates. Raw fruits. Stewed or dried prunes. Canned fruits with syrup. Meat and other protein foods  Fried or fatty meats. Deli meats. Chunky nut butters. Nuts and seeds. Beans and lentils. Berniece Salines. Hot dogs. Sausage. Dairy  High-fat cheeses. Whole milk, chocolate milk, and beverages made with milk, such as milk shakes. Half-and-half. Cream. sour cream. Ice cream. Beverages  Caffeinated beverages (such as coffee, tea, soda, or energy drinks). Alcoholic beverages. Fruit juices with pulp. Prune juice. Soft drinks sweetened with high-fructose corn syrup or sugar alcohols. High-calorie sports drinks. Fats and oils  Butter. Cream sauces. Margarine. Salad oils. Plain salad dressings. Olives. Avocados. Mayonnaise. Sweets and desserts  Sweet rolls, doughnuts, and sweet breads. Sugar-free desserts sweetened with sugar alcohols such as xylitol and sorbitol. Seasoning and other  foods  Honey. Hot sauce. Chili powder. Gravy. Cream-based or milk-based soups. Pancakes and waffles. Summary  When you have diarrhea, the foods you eat and your eating habits are very important.  Make sure you get at least 8-10 cups of fluid each day, or enough to keep your urine clear or pale yellow.  Eat bland foods and gradually reintroduce healthy, nutrient-rich foods as tolerated, or as told by your health care provider.  Avoid high-fiber, fried, greasy, or spicy foods. This information is not intended to replace advice given to you by your health care provider. Make sure you discuss any questions you have with your health care provider. Document Released: 06/26/2003 Document Revised: 04/02/2016 Document Reviewed: 04/02/2016 Elsevier Interactive Patient Education  2017 [employer].

## 2016-06-18 NOTE — Assessment & Plan Note (Signed)
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 No s/s of acute infection, doubt hardware malfunction. Will place referral for home health physical therapy to help regain ROM and reduce stiffness/pain.

## 2016-06-20 DIAGNOSIS — Z8542 Personal history of malignant neoplasm of other parts of uterus: Secondary | ICD-10-CM | POA: Diagnosis not present

## 2016-06-20 DIAGNOSIS — G8929 Other chronic pain: Secondary | ICD-10-CM | POA: Diagnosis not present

## 2016-06-20 DIAGNOSIS — M519 Unspecified thoracic, thoracolumbar and lumbosacral intervertebral disc disorder: Secondary | ICD-10-CM | POA: Diagnosis not present

## 2016-06-20 DIAGNOSIS — M25561 Pain in right knee: Secondary | ICD-10-CM | POA: Diagnosis not present

## 2016-06-20 DIAGNOSIS — Z87440 Personal history of urinary (tract) infections: Secondary | ICD-10-CM | POA: Diagnosis not present

## 2016-06-20 DIAGNOSIS — M25562 Pain in left knee: Secondary | ICD-10-CM | POA: Diagnosis not present

## 2016-06-20 DIAGNOSIS — I10 Essential (primary) hypertension: Secondary | ICD-10-CM | POA: Diagnosis not present

## 2016-06-20 DIAGNOSIS — I429 Cardiomyopathy, unspecified: Secondary | ICD-10-CM | POA: Diagnosis not present

## 2016-06-20 DIAGNOSIS — Z96653 Presence of artificial knee joint, bilateral: Secondary | ICD-10-CM | POA: Diagnosis not present

## 2016-06-20 DIAGNOSIS — E042 Nontoxic multinodular goiter: Secondary | ICD-10-CM | POA: Diagnosis not present

## 2016-06-20 DIAGNOSIS — M109 Gout, unspecified: Secondary | ICD-10-CM | POA: Diagnosis not present

## 2016-06-20 DIAGNOSIS — E7800 Pure hypercholesterolemia, unspecified: Secondary | ICD-10-CM | POA: Diagnosis not present

## 2016-06-21 ENCOUNTER — Telehealth: Payer: Self-pay | Admitting: *Deleted

## 2016-06-21 NOTE — Telephone Encounter (Signed)
Please ok those verbal orders, thanks

## 2016-06-21 NOTE — Telephone Encounter (Signed)
Left voicemail giving Valerie Ramirez the verbal order

## 2016-06-21 NOTE — Telephone Encounter (Signed)
Newell Coral from [facility] called requesting VO for PT 2x/week x 4 weeks. [phone]

## 2016-06-25 DIAGNOSIS — M519 Unspecified thoracic, thoracolumbar and lumbosacral intervertebral disc disorder: Secondary | ICD-10-CM | POA: Diagnosis not present

## 2016-06-25 DIAGNOSIS — M25562 Pain in left knee: Secondary | ICD-10-CM | POA: Diagnosis not present

## 2016-06-25 DIAGNOSIS — G8929 Other chronic pain: Secondary | ICD-10-CM | POA: Diagnosis not present

## 2016-06-25 DIAGNOSIS — I10 Essential (primary) hypertension: Secondary | ICD-10-CM | POA: Diagnosis not present

## 2016-06-25 DIAGNOSIS — M109 Gout, unspecified: Secondary | ICD-10-CM | POA: Diagnosis not present

## 2016-06-25 DIAGNOSIS — M25561 Pain in right knee: Secondary | ICD-10-CM | POA: Diagnosis not present

## 2016-06-28 ENCOUNTER — Telehealth: Payer: Self-pay | Admitting: Family Medicine

## 2016-06-28 DIAGNOSIS — M25561 Pain in right knee: Secondary | ICD-10-CM | POA: Diagnosis not present

## 2016-06-28 DIAGNOSIS — G8929 Other chronic pain: Secondary | ICD-10-CM | POA: Diagnosis not present

## 2016-06-28 DIAGNOSIS — M25562 Pain in left knee: Secondary | ICD-10-CM | POA: Diagnosis not present

## 2016-06-28 DIAGNOSIS — M519 Unspecified thoracic, thoracolumbar and lumbosacral intervertebral disc disorder: Secondary | ICD-10-CM | POA: Diagnosis not present

## 2016-06-28 DIAGNOSIS — I10 Essential (primary) hypertension: Secondary | ICD-10-CM | POA: Diagnosis not present

## 2016-06-28 DIAGNOSIS — M109 Gout, unspecified: Secondary | ICD-10-CM | POA: Diagnosis not present

## 2016-06-28 NOTE — Telephone Encounter (Signed)
Valerie Ramirez with [facility] stopped in to give an update on pt. [place]

## 2016-06-28 NOTE — Telephone Encounter (Signed)
She stated that Ms. Bonneau is doing fantastic

## 2016-06-29 DIAGNOSIS — H353131 Nonexudative age-related macular degeneration, bilateral, early dry stage: Secondary | ICD-10-CM | POA: Diagnosis not present

## 2016-06-29 NOTE — Telephone Encounter (Signed)
Good to hear that

## 2016-07-01 DIAGNOSIS — G8929 Other chronic pain: Secondary | ICD-10-CM | POA: Diagnosis not present

## 2016-07-01 DIAGNOSIS — M519 Unspecified thoracic, thoracolumbar and lumbosacral intervertebral disc disorder: Secondary | ICD-10-CM | POA: Diagnosis not present

## 2016-07-01 DIAGNOSIS — M25562 Pain in left knee: Secondary | ICD-10-CM | POA: Diagnosis not present

## 2016-07-01 DIAGNOSIS — M109 Gout, unspecified: Secondary | ICD-10-CM | POA: Diagnosis not present

## 2016-07-01 DIAGNOSIS — M25561 Pain in right knee: Secondary | ICD-10-CM | POA: Diagnosis not present

## 2016-07-01 DIAGNOSIS — I10 Essential (primary) hypertension: Secondary | ICD-10-CM | POA: Diagnosis not present

## 2016-07-06 DIAGNOSIS — I10 Essential (primary) hypertension: Secondary | ICD-10-CM | POA: Diagnosis not present

## 2016-07-06 DIAGNOSIS — M109 Gout, unspecified: Secondary | ICD-10-CM | POA: Diagnosis not present

## 2016-07-06 DIAGNOSIS — M519 Unspecified thoracic, thoracolumbar and lumbosacral intervertebral disc disorder: Secondary | ICD-10-CM | POA: Diagnosis not present

## 2016-07-06 DIAGNOSIS — M25562 Pain in left knee: Secondary | ICD-10-CM | POA: Diagnosis not present

## 2016-07-06 DIAGNOSIS — G8929 Other chronic pain: Secondary | ICD-10-CM | POA: Diagnosis not present

## 2016-07-06 DIAGNOSIS — M25561 Pain in right knee: Secondary | ICD-10-CM | POA: Diagnosis not present

## 2016-07-08 DIAGNOSIS — M25561 Pain in right knee: Secondary | ICD-10-CM | POA: Diagnosis not present

## 2016-07-08 DIAGNOSIS — M109 Gout, unspecified: Secondary | ICD-10-CM | POA: Diagnosis not present

## 2016-07-08 DIAGNOSIS — I10 Essential (primary) hypertension: Secondary | ICD-10-CM | POA: Diagnosis not present

## 2016-07-08 DIAGNOSIS — G8929 Other chronic pain: Secondary | ICD-10-CM | POA: Diagnosis not present

## 2016-07-08 DIAGNOSIS — M519 Unspecified thoracic, thoracolumbar and lumbosacral intervertebral disc disorder: Secondary | ICD-10-CM | POA: Diagnosis not present

## 2016-07-08 DIAGNOSIS — M25562 Pain in left knee: Secondary | ICD-10-CM | POA: Diagnosis not present

## 2016-07-13 DIAGNOSIS — M519 Unspecified thoracic, thoracolumbar and lumbosacral intervertebral disc disorder: Secondary | ICD-10-CM | POA: Diagnosis not present

## 2016-07-13 DIAGNOSIS — I10 Essential (primary) hypertension: Secondary | ICD-10-CM | POA: Diagnosis not present

## 2016-07-13 DIAGNOSIS — G8929 Other chronic pain: Secondary | ICD-10-CM | POA: Diagnosis not present

## 2016-07-13 DIAGNOSIS — M25562 Pain in left knee: Secondary | ICD-10-CM | POA: Diagnosis not present

## 2016-07-13 DIAGNOSIS — M25561 Pain in right knee: Secondary | ICD-10-CM | POA: Diagnosis not present

## 2016-07-13 DIAGNOSIS — M109 Gout, unspecified: Secondary | ICD-10-CM | POA: Diagnosis not present

## 2016-07-16 DIAGNOSIS — M25562 Pain in left knee: Secondary | ICD-10-CM | POA: Diagnosis not present

## 2016-07-16 DIAGNOSIS — M25561 Pain in right knee: Secondary | ICD-10-CM | POA: Diagnosis not present

## 2016-07-16 DIAGNOSIS — M109 Gout, unspecified: Secondary | ICD-10-CM | POA: Diagnosis not present

## 2016-07-16 DIAGNOSIS — G8929 Other chronic pain: Secondary | ICD-10-CM | POA: Diagnosis not present

## 2016-07-16 DIAGNOSIS — I10 Essential (primary) hypertension: Secondary | ICD-10-CM | POA: Diagnosis not present

## 2016-07-16 DIAGNOSIS — M519 Unspecified thoracic, thoracolumbar and lumbosacral intervertebral disc disorder: Secondary | ICD-10-CM | POA: Diagnosis not present

## 2016-07-22 ENCOUNTER — Other Ambulatory Visit: Payer: Self-pay | Admitting: Family Medicine

## 2016-07-23 NOTE — Telephone Encounter (Signed)
Last filled on 11/07/15 #30 tabs with 3 additional refills, f/u done on 06/01/16, please advise

## 2016-07-25 NOTE — Telephone Encounter (Signed)
Will refill electronically  

## 2016-07-26 ENCOUNTER — Encounter (INDEPENDENT_AMBULATORY_CARE_PROVIDER_SITE_OTHER): Payer: Self-pay

## 2016-07-26 ENCOUNTER — Other Ambulatory Visit: Payer: Self-pay

## 2016-07-26 ENCOUNTER — Ambulatory Visit (INDEPENDENT_AMBULATORY_CARE_PROVIDER_SITE_OTHER): Payer: Medicare Other

## 2016-07-26 VITALS — BP 140/80 | HR 75 | Temp 97.6°F | Ht 62.5 in | Wt 230.5 lb

## 2016-07-26 DIAGNOSIS — R739 Hyperglycemia, unspecified: Secondary | ICD-10-CM | POA: Diagnosis not present

## 2016-07-26 DIAGNOSIS — Z0000 Encounter for general adult medical examination without abnormal findings: Secondary | ICD-10-CM

## 2016-07-26 DIAGNOSIS — E039 Hypothyroidism, unspecified: Secondary | ICD-10-CM

## 2016-07-26 DIAGNOSIS — I10 Essential (primary) hypertension: Secondary | ICD-10-CM

## 2016-07-26 LAB — LIPID PANEL
Cholesterol: 215 mg/dL — ABNORMAL HIGH (ref 0–200)
HDL: 67.4 mg/dL (ref >39.00)
LDL Cholesterol: 132 mg/dL — ABNORMAL HIGH (ref 0–99)
NonHDL: 147.69
Total CHOL/HDL Ratio: 3
Triglycerides: 77 mg/dL (ref 0.0–149.0)
VLDL: 15.4 mg/dL (ref 0.0–40.0)

## 2016-07-26 LAB — CBC WITH DIFFERENTIAL/PLATELET
Basophils Absolute: 0.1 10*3/uL (ref 0.0–0.1)
Basophils Relative: 0.7 % (ref 0.0–3.0)
Eosinophils Absolute: 0.2 10*3/uL (ref 0.0–0.7)
Eosinophils Relative: 1.3 % (ref 0.0–5.0)
HCT: 33.4 % — ABNORMAL LOW (ref 36.0–46.0)
Hemoglobin: 10.8 g/dL — ABNORMAL LOW (ref 12.0–15.0)
Lymphocytes Relative: 13 % (ref 12.0–46.0)
Lymphs Abs: 2.2 10*3/uL (ref 0.7–4.0)
MCHC: 32.4 g/dL (ref 30.0–36.0)
MCV: 84.6 fl (ref 78.0–100.0)
Monocytes Absolute: 1.8 10*3/uL — ABNORMAL HIGH (ref 0.1–1.0)
Monocytes Relative: 10.6 % (ref 3.0–12.0)
Neutro Abs: 12.5 10*3/uL — ABNORMAL HIGH (ref 1.4–7.7)
Neutrophils Relative %: 74.4 % (ref 43.0–77.0)
Platelets: 212 10*3/uL (ref 150.0–400.0)
RBC: 3.94 Mil/uL (ref 3.87–5.11)
RDW: 19.4 % — ABNORMAL HIGH (ref 11.5–15.5)
WBC: 16.8 10*3/uL — ABNORMAL HIGH (ref 4.0–10.5)

## 2016-07-26 LAB — COMPREHENSIVE METABOLIC PANEL
ALT: 7 U/L (ref 0–35)
AST: 12 U/L (ref 0–37)
Albumin: 3.9 g/dL (ref 3.5–5.2)
Alkaline Phosphatase: 99 U/L (ref 39–117)
BUN: 21 mg/dL (ref 6–23)
CO2: 27 mEq/L (ref 19–32)
Calcium: 9.4 mg/dL (ref 8.4–10.5)
Chloride: 102 mEq/L (ref 96–112)
Creatinine, Ser: 1.28 mg/dL — ABNORMAL HIGH (ref 0.40–1.20)
GFR: 42.44 mL/min — ABNORMAL LOW (ref >60.00)
Glucose, Bld: 133 mg/dL — ABNORMAL HIGH (ref 70–99)
Potassium: 4.7 mEq/L (ref 3.5–5.1)
Sodium: 137 mEq/L (ref 135–145)
Total Bilirubin: 0.9 mg/dL (ref 0.2–1.2)
Total Protein: 7.5 g/dL (ref 6.0–8.3)

## 2016-07-26 LAB — HEMOGLOBIN A1C: Hgb A1c MFr Bld: 6 % (ref 4.6–6.5)

## 2016-07-26 LAB — TSH: TSH: 2.62 u[IU]/mL (ref 0.35–4.50)

## 2016-07-26 NOTE — Progress Notes (Signed)
Pre visit review using our clinic review tool, if applicable. No additional management support is needed unless otherwise documented below in the visit note. 

## 2016-07-26 NOTE — Telephone Encounter (Signed)
Pt in office today for AWV. Pt requested refill for Colchicine. Pain in right root: 10+/10. Pharmacy confirmed.

## 2016-07-26 NOTE — Telephone Encounter (Signed)
Valerie Ramirez -It looks like I already refilled this yesterday?

## 2016-07-26 NOTE — Progress Notes (Signed)
PCP notes:   Health maintenance:  Mammogram - addressed/pt will schedule  Abnormal screenings:   None  Patient concerns:   Pt is experiencing pain related to gout in right foot. Pain scale: 10+/10. Pt has requested refill of Colchicine. Refill request sent to PCP.  Nurse concerns:  None  Next PCP appt:   09/15/16 @ 1115  I reviewed health advisor's note, was available for consultation, and agree with documentation and plan. Loura Pardon MD

## 2016-07-26 NOTE — Patient Instructions (Signed)
Ms. Abdo , Thank you for taking time to come for your Medicare Wellness Visit. I appreciate your ongoing commitment to your health goals. Please review the following plan we discussed and let me know if I can assist you in the future.   These are the goals we discussed: Goals    . Increase water intake          Starting 07/26/2016, I will continue to drink at 6-8 glasses of water daily.        This is a list of the screening recommended for you and due dates:  Health Maintenance  Topic Date Due  . Mammogram  07/26/2017*  . Flu Shot  11/17/2016  . Tetanus Vaccine  11/01/2021  . DEXA scan (bone density measurement)  Completed  . Pneumonia vaccines  Completed  *Topic was postponed. The date shown is not the original due date.   Preventive Care for Adults  A healthy lifestyle and preventive care can promote health and wellness. Preventive health guidelines for adults include the following key practices.  . A routine yearly physical is a good way to check with your health care provider about your health and preventive screening. It is a chance to share any concerns and updates on your health and to receive a thorough exam.  . Visit your dentist for a routine exam and preventive care every 6 months. Brush your teeth twice a day and floss once a day. Good oral hygiene prevents tooth decay and gum disease.  . The frequency of eye exams is based on your age, health, family medical history, use  of contact lenses, and other factors. Follow your health care provider's ecommendations for frequency of eye exams.  . Eat a healthy diet. Foods like vegetables, fruits, whole grains, low-fat dairy products, and lean protein foods contain the nutrients you need without too many calories. Decrease your intake of foods high in solid fats, added sugars, and salt. Eat the right amount of calories for you. Get information about a proper diet from your health care provider, if necessary.  . Regular  physical exercise is one of the most important things you can do for your health. Most adults should get at least 150 minutes of moderate-intensity exercise (any activity that increases your heart rate and causes you to sweat) each week. In addition, most adults need muscle-strengthening exercises on 2 or more days a week.  Silver Sneakers may be a benefit available to you. To determine eligibility, you may visit the website: www.silversneakers.com or contact program at (534)726-4932 Mon-Fri between 8AM-8PM.   . Maintain a healthy weight. The body mass index (BMI) is a screening tool to identify possible weight problems. It provides an estimate of body fat based on height and weight. Your health care provider can find your BMI and can help you achieve or maintain a healthy weight.   For adults 20 years and older: ? A BMI below 18.5 is considered underweight. ? A BMI of 18.5 to 24.9 is normal. ? A BMI of 25 to 29.9 is considered overweight. ? A BMI of 30 and above is considered obese.   . Maintain normal blood lipids and cholesterol levels by exercising and minimizing your intake of saturated fat. Eat a balanced diet with plenty of fruit and vegetables. Blood tests for lipids and cholesterol should begin at age 59 and be repeated every 5 years. If your lipid or cholesterol levels are high, you are over 50, or you are at high risk  for heart disease, you may need your cholesterol levels checked more frequently. Ongoing high lipid and cholesterol levels should be treated with medicines if diet and exercise are not working.  . If you smoke, find out from your health care provider how to quit. If you do not use tobacco, please do not start.  . If you choose to drink alcohol, please do not consume more than 2 drinks per day. One drink is considered to be 12 ounces (355 mL) of beer, 5 ounces (148 mL) of wine, or 1.5 ounces (44 mL) of liquor.  . If you are 80-7 years old, ask your health care provider if  you should take aspirin to prevent strokes.  . Use sunscreen. Apply sunscreen liberally and repeatedly throughout the day. You should seek shade when your shadow is shorter than you. Protect yourself by wearing long sleeves, pants, a wide-brimmed hat, and sunglasses year round, whenever you are outdoors.  . Once a month, do a whole body skin exam, using a mirror to look at the skin on your back. Tell your health care provider of new moles, moles that have irregular borders, moles that are larger than a pencil eraser, or moles that have changed in shape or color.

## 2016-07-26 NOTE — Progress Notes (Signed)
Subjective:   Valerie Ramirez is a 83 y.o. female who presents for Medicare Annual (Subsequent) preventive examination.  Review of Systems:  [name] Cardiac Risk Factors include: advanced age (>48men, >34 women);obesity (BMI >30kg/m2);hypertension     Objective:     Vitals: BP 140/80 (BP Location: Right Arm, Patient Position: Sitting, Cuff Size: Large)   Pulse 75   Temp 97.6 F (36.4 C) (Oral)   Ht 5' 2.5" (1.588 m) Comment: no shoes  Wt 230 lb 8 oz (104.6 kg)   SpO2 96%   BMI 41.49 kg/m   Body mass index is 41.49 kg/m.   Tobacco History  Smoking Status  . Never Smoker  Smokeless Tobacco  . Never Used     Counseling given: No   Past Medical History:  Diagnosis Date  . Arthritis    OA  . Cancer ([place])    endometrial  . GERD (gastroesophageal reflux disease)   . Hyperlipidemia   . Hypertension   . Hypothyroid   . Kidney stone   . Shortness of breath dyspnea    Past Surgical History:  Procedure Laterality Date  . ANTERIOR CERVICAL DECOMP/DISCECTOMY FUSION [name] 08/18/2015   Procedure: C5-6, C6-7 Anterior Cervical Discectomy and Fusion, Allograft, Plate;  Surgeon: Marybelle Killings, MD;  Location: [place];  Service: Orthopedics;  Laterality: [name];  . bone spur removed Right    shoulder  . BOWEL RESECTION  07/04/2012   Procedure: SMALL BOWEL RESECTION;  Surgeon: Alvino Chapel, MD;  Location: [facility];  Service: Gynecology;;  . CHOLECYSTECTOMY    . DILATION AND CURETTAGE OF UTERUS  1999  . EYE SURGERY Bilateral    cataracts  . HAND SURGERY  12/2008   after hand fx/due to fall  . JOINT REPLACEMENT Bilateral 07/1998   knees  . KNEE ARTHROPLASTY  05/23/1998   total right  . LAPAROTOMY Bilateral 07/04/2012   Procedure: EXPLORATORY LAPAROTOMY TOTAL ABDOMINAL HYSTERECTOMY BILATERAL SALPINGO-OOPHORECTOMY with small bowel resection ;  Surgeon: Alvino Chapel, MD;  Location: [facility];  Service: Gynecology;  Laterality: Bilateral;  . polyp removal  1999  . TUBAL  LIGATION     Family History  Problem Relation Age of Onset  . Cancer Brother     LEUKEMIA  . Cancer Brother     LUNG  . Cancer Brother     STOMACH  . Heart disease Sister     CABG  . Stroke Sister   . Heart disease Brother     CABG   History  Sexual Activity  . Sexual activity: No    Outpatient Encounter Prescriptions as of 07/26/2016  Medication Sig  . amLODipine (NORVASC) 10 MG tablet Take 1 tablet (10 mg total) by mouth daily.  . Aspirin-Caffeine (BAYER BACK & BODY PO) Take 2 tablets by mouth every 4 (four) hours as needed (back pain).   . colchicine 0.6 MG tablet TAKE ONE TABLET TWO TIMES A DAY WITH FOOD AS NEEDED GOUT  . doxazosin (CARDURA) 8 MG tablet TAKE 1 TABLET (8 MG TOTAL) BY MOUTH AT BEDTIME.  . DULoxetine (CYMBALTA) 20 MG capsule TAKE 1 CAPSULE (20 MG TOTAL) BY MOUTH DAILY.  [name] HYDROcodone-acetaminophen (NORCO/VICODIN) 5-325 MG tablet Take 1-2 tablets by mouth every 6 (six) hours as needed.  [name] lisinopril (PRINIVIL,ZESTRIL) 5 MG tablet TAKE 1 TABLET (5 MG TOTAL) BY MOUTH DAILY.  [name] loperamide (IMODIUM) 2 MG capsule Take 1 capsule (2 mg total) by mouth as needed for diarrhea or loose stools.  [name]  metoprolol (LOPRESSOR) 100 MG tablet TAKE 1 TABLET (100 MG TOTAL) BY MOUTH 2 (TWO) TIMES DAILY.  . vitamin B-12 (CYANOCOBALAMIN) 1000 MCG tablet Take 1,000 mcg by mouth daily.   No facility-administered encounter medications on file as of 07/26/2016.     Activities of Daily Living In your present state of health, do you have any difficulty performing the following activities: 07/26/2016 04/26/2016  Hearing? Y N  Vision? N N  Difficulty concentrating or making decisions? N N  Walking or climbing stairs? N Y  Dressing or bathing? N Y  Doing errands, shopping? N N  Preparing Food and eating ? N -  Using the Toilet? N -  In the past six months, have you accidently leaked urine? Y -  Do you have problems with loss of bowel control? N -  Managing your Medications? N -  Managing  your Finances? N -  Housekeeping or managing your Housekeeping? N -  Some recent data might be hidden    Patient Care Team: Abner Greenspan, MD as PCP - General (Family Medicine)    Assessment:    Hearing Screening Comments: Bilateral hearing aids Vision Screening Comments: Last vision exam in Feb 2018 with Dr. Lorie Apley  Exercise Activities and Dietary recommendations Current Exercise Habits: The patient does not participate in regular exercise at present, Exercise limited by: orthopedic condition(s)  Goals    . Increase water intake          Starting 07/26/2016, I will continue to drink at 6-8 glasses of water daily.       Fall Risk Fall Risk  07/26/2016 12/12/2015 08/12/2014 08/10/2013 08/08/2012  Falls in the past year? No Yes Yes No Yes  Number falls in past yr: - 2 or more 2 or more - -  Injury with Fall? - No No - -  Risk Factor Category  - - High Fall Risk - -  Risk for fall due to : - - History of fall(s) - Other (Comment)  Risk for fall due to (comments): - - - - foot drop/ occ uses cane   Depression Screen PHQ 2/9 Scores 07/26/2016 08/12/2014 08/10/2013 08/08/2012  PHQ - 2 Score 0 0 0 0     Cognitive Function MMSE - Mini Mental State Exam 07/26/2016  Orientation to time 5  Orientation to Place 5  Registration 3  Attention/ Calculation 0  Recall 3  Language- name 2 objects 0  Language- repeat 1  Language- follow 3 step command 3  Language- read & follow direction 0  Write a sentence 0  Copy design 0  Total score 20       PLEASE NOTE: A Mini-Cog screen was completed. Maximum score is 20. A value of 0 denotes this part of [place] MMSE was not completed or the patient failed this part of the Mini-Cog screening.   Mini-Cog Screening Orientation to Time - Max 5 pts Orientation to Place - Max 5 pts Registration - Max 3 pts Recall - Max 3 pts Language Repeat - Max 1 pts Language Follow 3 Step Command - Max 3 pts   Immunization History  Administered Date(s)  Administered  . Influenza Split 04/01/2011  . Influenza Whole 01/20/2012  . Influenza, High Dose Seasonal PF 01/29/2016  . Influenza,inj,Quad PF,36+ Mos 05/16/2015  . Influenza-Unspecified 02/15/2013, 02/28/2014  . Pneumococcal Conjugate-13 08/12/2014  . Pneumococcal Polysaccharide-23 07/07/2012  . Td 05/14/1997, 11/02/2011   Screening Tests Health Maintenance  Topic Date Due  . MAMMOGRAM  07/26/2017 (Originally 10/05/2014)  . INFLUENZA VACCINE  11/17/2016  . TETANUS/TDAP  11/01/2021  . DEXA SCAN  Completed  . PNA vac Low Risk Adult  Completed      Plan:     I have personally reviewed and addressed the Medicare Annual Wellness questionnaire and have noted the following in the patient's chart:  A. Medical and social history B. Use of alcohol, tobacco or illicit drugs  C. Current medications and supplements D. Functional ability and status E.  Nutritional status F.  Physical activity G. Advance directives H. List of other physicians I.  Hospitalizations, surgeries, and ER visits in previous 12 months J.  [place] to include hearing, vision, cognitive, depression L. Referrals and appointments - none  In addition, I have reviewed and discussed with patient certain preventive protocols, quality metrics, and best practice recommendations. A written personalized care plan for preventive services as well as general preventive health recommendations were provided to patient.  See attached scanned questionnaire for additional information.   Signed,   Lindell Noe, MHA, BS, LPN Health Coach

## 2016-09-01 ENCOUNTER — Encounter: Payer: Self-pay | Admitting: Gynecology

## 2016-09-15 ENCOUNTER — Encounter: Payer: Self-pay | Admitting: Family Medicine

## 2016-09-15 ENCOUNTER — Ambulatory Visit (INDEPENDENT_AMBULATORY_CARE_PROVIDER_SITE_OTHER): Payer: Medicare Other | Admitting: Family Medicine

## 2016-09-15 VITALS — BP 130/75 | HR 68 | Temp 97.7°F | Ht 62.5 in | Wt 230.5 lb

## 2016-09-15 DIAGNOSIS — N179 Acute kidney failure, unspecified: Secondary | ICD-10-CM

## 2016-09-15 DIAGNOSIS — R739 Hyperglycemia, unspecified: Secondary | ICD-10-CM | POA: Diagnosis not present

## 2016-09-15 DIAGNOSIS — H9191 Unspecified hearing loss, right ear: Secondary | ICD-10-CM

## 2016-09-15 DIAGNOSIS — I10 Essential (primary) hypertension: Secondary | ICD-10-CM | POA: Diagnosis not present

## 2016-09-15 DIAGNOSIS — E039 Hypothyroidism, unspecified: Secondary | ICD-10-CM

## 2016-09-15 DIAGNOSIS — E7800 Pure hypercholesterolemia, unspecified: Secondary | ICD-10-CM | POA: Diagnosis not present

## 2016-09-15 DIAGNOSIS — M1A9XX0 Chronic gout, unspecified, without tophus (tophi): Secondary | ICD-10-CM

## 2016-09-15 DIAGNOSIS — D72829 Elevated white blood cell count, unspecified: Secondary | ICD-10-CM | POA: Diagnosis not present

## 2016-09-15 DIAGNOSIS — M546 Pain in thoracic spine: Secondary | ICD-10-CM | POA: Diagnosis not present

## 2016-09-15 LAB — POC URINALSYSI DIPSTICK (AUTOMATED)
Bilirubin, UA: NEGATIVE
Blood, UA: NEGATIVE
Glucose, UA: NEGATIVE
Ketones, UA: NEGATIVE
Leukocytes, UA: NEGATIVE
Nitrite, UA: NEGATIVE
Protein, UA: 15
Spec Grav, UA: >=1.03 — AB (ref 1.010–1.025)
Urobilinogen, UA: 0.2 E.U./dL
pH, UA: 6 (ref 5.0–8.0)

## 2016-09-15 LAB — BASIC METABOLIC PANEL
BUN: 20 mg/dL (ref 6–23)
CO2: 29 mEq/L (ref 19–32)
Calcium: 9.3 mg/dL (ref 8.4–10.5)
Chloride: 103 mEq/L (ref 96–112)
Creatinine, Ser: 1.26 mg/dL — ABNORMAL HIGH (ref 0.40–1.20)
GFR: 43.2 mL/min — ABNORMAL LOW (ref >60.00)
Glucose, Bld: 117 mg/dL — ABNORMAL HIGH (ref 70–99)
Potassium: 4.3 mEq/L (ref 3.5–5.1)
Sodium: 138 mEq/L (ref 135–145)

## 2016-09-15 LAB — CBC WITH DIFFERENTIAL/PLATELET
Basophils Absolute: 0.1 10*3/uL (ref 0.0–0.1)
Basophils Relative: 1.2 % (ref 0.0–3.0)
Eosinophils Absolute: 0.3 10*3/uL (ref 0.0–0.7)
Eosinophils Relative: 2.7 % (ref 0.0–5.0)
HCT: 33.5 % — ABNORMAL LOW (ref 36.0–46.0)
Hemoglobin: 10.9 g/dL — ABNORMAL LOW (ref 12.0–15.0)
Lymphocytes Relative: 16.1 % (ref 12.0–46.0)
Lymphs Abs: 1.7 10*3/uL (ref 0.7–4.0)
MCHC: 32.7 g/dL (ref 30.0–36.0)
MCV: 82.7 fl (ref 78.0–100.0)
Monocytes Absolute: 0.9 10*3/uL (ref 0.1–1.0)
Monocytes Relative: 8.5 % (ref 3.0–12.0)
Neutro Abs: 7.7 10*3/uL (ref 1.4–7.7)
Neutrophils Relative %: 71.5 % (ref 43.0–77.0)
Platelets: 260 10*3/uL (ref 150.0–400.0)
RBC: 4.05 Mil/uL (ref 3.87–5.11)
RDW: 18 % — ABNORMAL HIGH (ref 11.5–15.5)
WBC: 10.7 10*3/uL — ABNORMAL HIGH (ref 4.0–10.5)

## 2016-09-15 MED ORDER — METOPROLOL TARTRATE 100 MG PO TABS: ORAL_TABLET | ORAL | 3 refills | Status: DC

## 2016-09-15 MED ORDER — DOXAZOSIN MESYLATE 8 MG PO TABS: ORAL_TABLET | ORAL | 3 refills | Status: DC

## 2016-09-15 MED ORDER — LISINOPRIL 5 MG PO TABS: ORAL_TABLET | ORAL | 3 refills | Status: DC

## 2016-09-15 NOTE — Assessment & Plan Note (Signed)
L side-after working on a fence Tight musculature-suspect spasm Suggest stretch/heat Consider xray if no imp in 1-2 wk

## 2016-09-15 NOTE — Assessment & Plan Note (Signed)
Disc prediabetes Lab Results  Component Value Date   HGBA1C 6.0 07/26/2016   disc imp of low glycemic diet and wt loss to prevent DM2 She is unlikely to watch her diet

## 2016-09-15 NOTE — Assessment & Plan Note (Signed)
Discussed how this problem influences overall health and the risks it imposes  Reviewed plan for weight loss with lower calorie diet (via better food choices and also portion control or program like weight watchers) and exercise building up to or more than 30 minutes 5 days per week including some aerobic activity   She is pre diabetic Not very open to diet change

## 2016-09-15 NOTE — Progress Notes (Signed)
Subjective:    Patient ID: Valerie Ramirez, female    DOB: 1933-02-02, 83 y.o.   MRN: [id number]  HPI Here for annual f/u of chronic medical problems   Having a lot of pain in L lower back  No injury known  Hurts to move  Does not feel like she has a uti   Other than that doing ok   amw was 4/9 No care gaps or concerns  Wt Readings from Last 3 Encounters:  09/15/16 230 lb 8 oz (104.6 kg)  07/26/16 230 lb 8 oz (104.6 kg)  06/18/16 228 lb (103.4 kg)   bmi 41.4 Morbid obese category  Colon screen 5/16 neg ifob She declines further screening    Breast cancer screening - wants to schedule a mammogram  Mammogram 5/15 Self breast exam -no lumps   dexa 6/15 at gyn bmd in nl range No recent falls or fx Has hx of remote fx  She ran out of D  No longer sees gyn   Zoster status -interested in shingrix Has not had ? Chicken pox/unsure   Hysterectomy in the past for endometrial cancer   bp is stable today  No cp or palpitations or headaches or edema  No side effects to medicines  BP Readings from Last 3 Encounters:  09/15/16 (!) 142/78  07/26/16 140/80  06/18/16 124/70        Chemistry      Component Value Date/Time   NA 137 07/26/2016 1129   K 4.7 07/26/2016 1129   CL 102 07/26/2016 1129   CO2 27 07/26/2016 1129   BUN 21 07/26/2016 1129   CREATININE 1.28 (H) 07/26/2016 1129      Component Value Date/Time   CALCIUM 9.4 07/26/2016 1129   ALKPHOS 99 07/26/2016 1129   AST 12 07/26/2016 1129   ALT 7 07/26/2016 1129   BILITOT 0.9 07/26/2016 1129     she states she drinks a lot of water  She takes asa for back pain   Results for orders placed or performed in visit on 09/15/16  POCT Urinalysis Dipstick (Automated)  Result Value Ref Range   Color, UA Yellow    Clarity, UA Hazy    Glucose, UA Negative    Bilirubin, UA Negative    Ketones, UA Negative    Spec Grav, UA >=1.030 (A) 1.010 - 1.025   Blood, UA Negative    pH, UA 6.0 5.0 - 8.0   Protein, UA  15 mg/dL    Urobilinogen, UA 0.2 0.2 or 1.0 E.U./dL   Nitrite, UA Negative    Leukocytes, UA Negative Negative   urine is concentrated with some protein  Hypothyroidism  Pt has no clinical changes No change in energy level/ hair or skin/ edema and no tremor Lab Results  Component Value Date   TSH 2.62 07/26/2016      Hx of gout  Intol of allopurinol Uses colchicine prn   Hx of hyperlipidemia Lab Results  Component Value Date   CHOL 215 (H) 07/26/2016   CHOL 210 (H) 10/31/2015   CHOL 188 11/06/2014   Lab Results  Component Value Date   HDL 67.40 07/26/2016   HDL 45.40 10/31/2015   HDL 48.00 11/06/2014   Lab Results  Component Value Date   LDLCALC 132 (H) 07/26/2016   LDLCALC 117 (H) 11/06/2014   LDLCALC 118 (H) 08/05/2014   Lab Results  Component Value Date   TRIG 77.0 07/26/2016   TRIG 212.0 (H) 10/31/2015  TRIG 114.0 11/06/2014   Lab Results  Component Value Date   CHOLHDL 3 07/26/2016   CHOLHDL 5 10/31/2015   CHOLHDL 4 11/06/2014   Lab Results  Component Value Date   LDLDIRECT 136.0 10/31/2015   LDLDIRECT 115.6 07/28/2012   LDLDIRECT 139.5 10/05/2010   Intolerant of statin medicines  HDL went up and LDL went up   Hx of hyperglycemia Lab Results  Component Value Date   HGBA1C 6.0 07/26/2016   Hx of leukocytosis  Lab Results  Component Value Date   WBC 16.8 (H) 07/26/2016   HGB 10.8 (L) 07/26/2016   HCT 33.4 (L) 07/26/2016   MCV 84.6 07/26/2016   PLT 212.0 07/26/2016     Patient Active Problem List   Diagnosis Date Noted  . Thoracic back pain 09/15/2016  . History of radius fracture 06/01/2016  . Hyponatremia 04/25/2016  . AKI (acute kidney injury) ([place]) 04/25/2016  . Leucocytosis 04/25/2016  . Status post cervical spinal fusion 08/18/2015  . Osteoarthritis, hip, bilateral 08/12/2014  . Hearing loss in right ear 06/12/2014  . Colon cancer screening 08/10/2013  . Seborrheic keratoses, inflamed 08/10/2013  . History of fall  04/10/2013  . Encounter for Medicare annual wellness exam 08/08/2012  . Gout 07/14/2012  . History of endometrial cancer 05/25/2012  . Degenerative joint disease of cervical spine 05/03/2011  . Other screening mammogram 03/31/2011  . Hyperglycemia 09/26/2007  . Morbid obesity ([place]) 07/12/2007  . GOITER, MULTINODULAR 07/11/2007  . Essential hypertension 07/11/2007  . History of cardiomyopathy 07/11/2007  . Osteoarthritis 07/11/2007  . 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 Hypothyroidism 10/05/2006  . HYPERCHOLESTEROLEMIA, PURE 10/05/2006   Past Medical History:  Diagnosis Date  . Arthritis    OA  . Cancer ([place])    endometrial  . GERD (gastroesophageal reflux disease)   . Hyperlipidemia   . Hypertension   . Hypothyroid   . Kidney stone   . Shortness of breath dyspnea    Past Surgical History:  Procedure Laterality Date  . ANTERIOR CERVICAL DECOMP/DISCECTOMY FUSION [name] 08/18/2015   Procedure: C5-6, C6-7 Anterior Cervical Discectomy and Fusion, Allograft, Plate;  Surgeon: Marybelle Killings, MD;  Location: [facility];  Service: Orthopedics;  Laterality: [name];  . bone spur removed Right    shoulder  . BOWEL RESECTION  07/04/2012   Procedure: SMALL BOWEL RESECTION;  Surgeon: Alvino Chapel, MD;  Location: [facility];  Service: Gynecology;;  . CHOLECYSTECTOMY    . DILATION AND CURETTAGE OF UTERUS  1999  . EYE SURGERY Bilateral    cataracts  . HAND SURGERY  12/2008   after hand fx/due to fall  . JOINT REPLACEMENT Bilateral 07/1998   knees  . KNEE ARTHROPLASTY  05/23/1998   total right  . LAPAROTOMY Bilateral 07/04/2012   Procedure: EXPLORATORY LAPAROTOMY TOTAL ABDOMINAL HYSTERECTOMY BILATERAL SALPINGO-OOPHORECTOMY with small bowel resection ;  Surgeon: Alvino Chapel, MD;  Location: [facility];  Service: Gynecology;  Laterality: Bilateral;  . polyp removal  1999  . TUBAL LIGATION     Social History  Substance Use Topics  . Smoking status: Never Smoker  . Smokeless  tobacco: Never Used  . Alcohol use No   Family History  Problem Relation Age of Onset  . Cancer Brother        LEUKEMIA  . Cancer Brother        LUNG  . Cancer Brother        STOMACH  . Heart disease Sister  CABG  . Stroke Sister   . Heart disease Brother        CABG   Allergies  Allergen Reactions  . Allopurinol Nausea Only  . Amoxicillin-Pot Clavulanate     REACTION: stomach upset  . Lipitor [Atorvastatin Calcium] Other (See Comments)    Leg ache and ache all over  . Simvastatin     REACTION: muscle pain  . Valium [Diazepam] Other (See Comments)    "drives me crazy"  . Tape Itching and Rash    "Cannot use paper tape" --- also causes blisters. Use plastic tape   Current Outpatient Prescriptions on File Prior to Visit  Medication Sig Dispense Refill  . amLODipine (NORVASC) 10 MG tablet Take 1 tablet (10 mg total) by mouth daily. 30 tablet 2  . Aspirin-Caffeine (BAYER BACK & BODY PO) Take 2 tablets by mouth every 4 (four) hours as needed (back pain).     . colchicine 0.6 MG tablet TAKE ONE TABLET TWO TIMES A DAY WITH FOOD AS NEEDED GOUT 30 tablet 3  . DULoxetine (CYMBALTA) 20 MG capsule TAKE 1 CAPSULE (20 MG TOTAL) BY MOUTH DAILY. 90 capsule 3  . HYDROcodone-acetaminophen (NORCO/VICODIN) 5-325 MG tablet Take 1-2 tablets by mouth every 6 (six) hours as needed. 20 tablet 0  . loperamide (IMODIUM) 2 MG capsule Take 1 capsule (2 mg total) by mouth as needed for diarrhea or loose stools. 20 capsule 0  . vitamin B-12 (CYANOCOBALAMIN) 1000 MCG tablet Take 1,000 mcg by mouth daily.     No current facility-administered medications on file prior to visit.     Review of Systems Review of Systems  Constitutional: Negative for fever, appetite change, f and unexpected weight change.  Eyes: Negative for pain and visual disturbance.  Respiratory: Negative for cough and shortness of breath.   Cardiovascular: Negative for cp or palpitations    Gastrointestinal: Negative for  nausea, diarrhea and constipation.  Genitourinary: Negative for urgency and frequency.  Skin: Negative for pallor or rash   MSK pos for L mid back pain /muscle spasm Neurological: Negative for weakness, light-headedness, numbness and headaches.  Hematological: Negative for adenopathy. Does not bruise/bleed easily.  Psychiatric/Behavioral: Negative for dysphoric mood. The patient is not nervous/anxious. Pos for caregiver stress        Objective:   Physical Exam  Constitutional: She appears well-developed and well-nourished. No distress.  Morbidly obese and well appearing   HENT:  Head: Normocephalic and atraumatic.  Right Ear: External ear normal.  Left Ear: External ear normal.  Mouth/Throat: Oropharynx is clear and moist.  Eyes: Conjunctivae and EOM are normal. Pupils are equal, round, and reactive to light. No scleral icterus.  Neck: Normal range of motion. Neck supple. No JVD present. Carotid bruit is not present. No thyromegaly present.  Cardiovascular: Normal rate, regular rhythm, normal heart sounds and intact distal pulses.  Exam reveals no gallop.   Pulmonary/Chest: Effort normal and breath sounds normal. No respiratory distress. She has no wheezes. She exhibits no tenderness.  Abdominal: Soft. Bowel sounds are normal. She exhibits no distension, no abdominal bruit and no mass. There is no tenderness.  Genitourinary: No breast swelling, tenderness, discharge or bleeding.  Genitourinary Comments: Breast exam: No mass, nodules, thickening, tenderness, bulging, retraction, inflamation, nipple discharge or skin changes noted.  No axillary or clavicular LA.      Musculoskeletal: Normal range of motion. She exhibits no edema or tenderness.  Tender over L TS musculature with spasm No bony tendenress  Lymphadenopathy:  She has no cervical adenopathy.  Neurological: She is alert. She has normal reflexes. No cranial nerve deficit. She exhibits normal muscle tone. Coordination normal.    Skin: Skin is warm and dry. No rash noted. No erythema. No pallor.  Solar lentigines diffusely Also sks   Psychiatric: She has a normal mood and affect.          Assessment & Plan:   Problem List Items Addressed This Visit      Cardiovascular and Mediastinum   Essential hypertension - Primary    bp in fair control at this time  BP Readings from Last 1 Encounters:  09/15/16 130/75   No changes needed Disc lifstyle change with low sodium diet and exercise  Enc wt loss      Relevant Medications   doxazosin (CARDURA) 8 MG tablet   lisinopril (PRINIVIL,ZESTRIL) 5 MG tablet   metoprolol tartrate (LOPRESSOR) 100 MG tablet   Other Relevant Orders   Basic metabolic panel     Endocrine   Hypothyroidism    Hypothyroidism  Pt has no clinical changes No change in energy level/ hair or skin/ edema and no tremor Lab Results  Component Value Date   TSH 2.62 07/26/2016          Relevant Medications   metoprolol tartrate (LOPRESSOR) 100 MG tablet     Nervous and Auditory   Hearing loss in right ear    Wears hearing aide        Genitourinary   AKI (acute kidney injury) ([place])    Lab Results  Component Value Date   CREATININE 1.28 (H) 07/26/2016   This is back up  Enc water intake ua today  discouraged asa or nsaid use  Re check lab today      Relevant Orders   Basic metabolic panel   POCT Urinalysis Dipstick (Automated) (Completed)     Other   Gout    Uses colchicine prn Intol of allopurinol Enc low purine diet       HYPERCHOLESTEROLEMIA, PURE    Disc goals for lipids and reasons to control them Rev labs with pt Rev low sat fat diet in detail  Intolerant of statins  HDL is up  LDL is up  Ratio is down      Relevant Medications   doxazosin (CARDURA) 8 MG tablet   lisinopril (PRINIVIL,ZESTRIL) 5 MG tablet   metoprolol tartrate (LOPRESSOR) 100 MG tablet   Hyperglycemia    Disc prediabetes Lab Results  Component Value Date   HGBA1C 6.0  07/26/2016   disc imp of low glycemic diet and wt loss to prevent DM2 She is unlikely to watch her diet       Leucocytosis    Also anemia with hb of 10.8 ua neg for signs of infection  Re check this today with path eval of smear  She had brother with leukemia       Relevant Orders   CBC with Differential/Platelet   Pathologist smear review   POCT Urinalysis Dipstick (Automated) (Completed)   Morbid obesity ([place])    Discussed how this problem influences overall health and the risks it imposes  Reviewed plan for weight loss with lower calorie diet (via better food choices and also portion control or program like weight watchers) and exercise building up to or more than 30 minutes 5 days per week including some aerobic activity   She is pre diabetic Not very open to diet change      Thoracic  back pain    L side-after working on a fence Tight musculature-suspect spasm Suggest stretch/heat Consider xray if no imp in 1-2 wk

## 2016-09-15 NOTE — Assessment & Plan Note (Signed)
Hypothyroidism  Pt has no clinical changes No change in energy level/ hair or skin/ edema and no tremor Lab Results  Component Value Date   TSH 2.62 07/26/2016

## 2016-09-15 NOTE — Assessment & Plan Note (Signed)
Uses colchicine prn Intol of allopurinol Enc low purine diet

## 2016-09-15 NOTE — Assessment & Plan Note (Signed)
bp in fair control at this time  BP Readings from Last 1 Encounters:  09/15/16 130/75   No changes needed Disc lifstyle change with low sodium diet and exercise  Enc wt loss

## 2016-09-15 NOTE — Assessment & Plan Note (Signed)
Disc goals for lipids and reasons to control them Rev labs with pt Rev low sat fat diet in detail  Intolerant of statins  HDL is up  LDL is up  Ratio is down

## 2016-09-15 NOTE — Assessment & Plan Note (Addendum)
Also anemia with hb of 10.8 ua neg for signs of infection  Re check this today with path eval of smear  She had brother with leukemia

## 2016-09-15 NOTE — Assessment & Plan Note (Signed)
Lab Results  Component Value Date   CREATININE 1.28 (H) 07/26/2016   This is back up  Enc water intake ua today  discouraged asa or nsaid use  Re check lab today

## 2016-09-15 NOTE — Patient Instructions (Addendum)
Don't forget to schedule your mammogram at [facility]  Here is a paper with the phone number   Don't forget to start back on your vitamin D   If you are interested in the new shingles vaccine (Shingrix) - call your insurance to check on coverage,( you should not get it within 1 month of other vaccines) , then call us for a prescription  for it to take to a pharmacy that gives the shot , or make a nurse visit to get it here depending on your coverage  you are a candidate even if you have not had the chicken pox   Your blood sugar is in the prediabetes range  Watch your diet closely - try to step back from sweets the best you can  Here are some handouts -we will keep an eye out   For cholesterol : Avoid red meat/ fried foods/ egg yolks/ fatty breakfast meats/ butter, cheese and high fat dairy/ and shellfish    Use heat on your back and try to stretch it  If no improvement in a week or two call to schedule an xray   Lab today for blood count and also kidney function  Re check urine as well

## 2016-09-15 NOTE — Assessment & Plan Note (Signed)
Wears hearing aide

## 2016-09-16 LAB — PATHOLOGIST SMEAR REVIEW

## 2016-10-01 ENCOUNTER — Other Ambulatory Visit (INDEPENDENT_AMBULATORY_CARE_PROVIDER_SITE_OTHER): Payer: Medicare Other

## 2016-10-01 DIAGNOSIS — I10 Essential (primary) hypertension: Secondary | ICD-10-CM

## 2016-10-01 LAB — BASIC METABOLIC PANEL
BUN: 29 mg/dL — ABNORMAL HIGH (ref 6–23)
CO2: 26 mEq/L (ref 19–32)
Calcium: 9.5 mg/dL (ref 8.4–10.5)
Chloride: 105 mEq/L (ref 96–112)
Creatinine, Ser: 1.4 mg/dL — ABNORMAL HIGH (ref 0.40–1.20)
GFR: 38.25 mL/min — ABNORMAL LOW (ref >60.00)
Glucose, Bld: 130 mg/dL — ABNORMAL HIGH (ref 70–99)
Potassium: 4.3 mEq/L (ref 3.5–5.1)
Sodium: 140 mEq/L (ref 135–145)

## 2016-10-28 ENCOUNTER — Encounter: Payer: Self-pay | Admitting: *Deleted

## 2017-02-03 DIAGNOSIS — H353131 Nonexudative age-related macular degeneration, bilateral, early dry stage: Secondary | ICD-10-CM | POA: Diagnosis not present

## 2017-02-10 DIAGNOSIS — Z23 Encounter for immunization: Secondary | ICD-10-CM | POA: Diagnosis not present

## 2017-02-17 ENCOUNTER — Other Ambulatory Visit: Payer: Self-pay | Admitting: Family Medicine

## 2017-04-04 ENCOUNTER — Emergency Department (HOSPITAL_COMMUNITY): Payer: Medicare Other

## 2017-04-04 ENCOUNTER — Encounter (HOSPITAL_COMMUNITY): Payer: Self-pay

## 2017-04-04 ENCOUNTER — Other Ambulatory Visit: Payer: Self-pay

## 2017-04-04 DIAGNOSIS — N179 Acute kidney failure, unspecified: Secondary | ICD-10-CM | POA: Diagnosis not present

## 2017-04-04 DIAGNOSIS — I160 Hypertensive urgency: Secondary | ICD-10-CM | POA: Diagnosis not present

## 2017-04-04 DIAGNOSIS — I214 Non-ST elevation (NSTEMI) myocardial infarction: Secondary | ICD-10-CM | POA: Diagnosis not present

## 2017-04-04 DIAGNOSIS — R0789 Other chest pain: Secondary | ICD-10-CM | POA: Diagnosis not present

## 2017-04-04 DIAGNOSIS — N183 Chronic kidney disease, stage 3 (moderate): Secondary | ICD-10-CM | POA: Diagnosis not present

## 2017-04-04 DIAGNOSIS — Z8249 Family history of ischemic heart disease and other diseases of the circulatory system: Secondary | ICD-10-CM

## 2017-04-04 DIAGNOSIS — E785 Hyperlipidemia, unspecified: Secondary | ICD-10-CM | POA: Diagnosis present

## 2017-04-04 DIAGNOSIS — Z6841 Body Mass Index (BMI) 40.0 and over, adult: Secondary | ICD-10-CM | POA: Diagnosis not present

## 2017-04-04 DIAGNOSIS — H9191 Unspecified hearing loss, right ear: Secondary | ICD-10-CM | POA: Diagnosis present

## 2017-04-04 DIAGNOSIS — I25110 Atherosclerotic heart disease of native coronary artery with unstable angina pectoris: Secondary | ICD-10-CM | POA: Diagnosis not present

## 2017-04-04 DIAGNOSIS — Z79899 Other long term (current) drug therapy: Secondary | ICD-10-CM

## 2017-04-04 DIAGNOSIS — E039 Hypothyroidism, unspecified: Secondary | ICD-10-CM | POA: Diagnosis present

## 2017-04-04 DIAGNOSIS — R079 Chest pain, unspecified: Secondary | ICD-10-CM | POA: Diagnosis not present

## 2017-04-04 DIAGNOSIS — D649 Anemia, unspecified: Secondary | ICD-10-CM | POA: Diagnosis present

## 2017-04-04 DIAGNOSIS — I10 Essential (primary) hypertension: Secondary | ICD-10-CM | POA: Diagnosis not present

## 2017-04-04 DIAGNOSIS — R001 Bradycardia, unspecified: Secondary | ICD-10-CM | POA: Diagnosis present

## 2017-04-04 DIAGNOSIS — I129 Hypertensive chronic kidney disease with stage 1 through stage 4 chronic kidney disease, or unspecified chronic kidney disease: Secondary | ICD-10-CM | POA: Diagnosis not present

## 2017-04-04 DIAGNOSIS — Z90710 Acquired absence of both cervix and uterus: Secondary | ICD-10-CM

## 2017-04-04 DIAGNOSIS — Z7722 Contact with and (suspected) exposure to environmental tobacco smoke (acute) (chronic): Secondary | ICD-10-CM | POA: Diagnosis present

## 2017-04-04 DIAGNOSIS — Z91048 Other nonmedicinal substance allergy status: Secondary | ICD-10-CM

## 2017-04-04 DIAGNOSIS — Z9079 Acquired absence of other genital organ(s): Secondary | ICD-10-CM

## 2017-04-04 DIAGNOSIS — Z981 Arthrodesis status: Secondary | ICD-10-CM

## 2017-04-04 DIAGNOSIS — Z9049 Acquired absence of other specified parts of digestive tract: Secondary | ICD-10-CM

## 2017-04-04 DIAGNOSIS — Z888 Allergy status to other drugs, medicaments and biological substances status: Secondary | ICD-10-CM

## 2017-04-04 DIAGNOSIS — Z90722 Acquired absence of ovaries, bilateral: Secondary | ICD-10-CM

## 2017-04-04 DIAGNOSIS — M109 Gout, unspecified: Secondary | ICD-10-CM | POA: Diagnosis present

## 2017-04-04 DIAGNOSIS — Z8589 Personal history of malignant neoplasm of other organs and systems: Secondary | ICD-10-CM

## 2017-04-04 DIAGNOSIS — K219 Gastro-esophageal reflux disease without esophagitis: Secondary | ICD-10-CM | POA: Diagnosis present

## 2017-04-04 DIAGNOSIS — Z96653 Presence of artificial knee joint, bilateral: Secondary | ICD-10-CM | POA: Diagnosis present

## 2017-04-04 DIAGNOSIS — Z881 Allergy status to other antibiotic agents status: Secondary | ICD-10-CM

## 2017-04-04 LAB — CBC
HCT: 35.9 % — ABNORMAL LOW (ref 36.0–46.0)
Hemoglobin: 11.2 g/dL — ABNORMAL LOW (ref 12.0–15.0)
MCH: 26.5 pg (ref 26.0–34.0)
MCHC: 31.2 g/dL (ref 30.0–36.0)
MCV: 85.1 fL (ref 78.0–100.0)
Platelets: 206 10*3/uL (ref 150–400)
RBC: 4.22 MIL/uL (ref 3.87–5.11)
RDW: 17.4 % — ABNORMAL HIGH (ref 11.5–15.5)
WBC: 9.6 10*3/uL (ref 4.0–10.5)

## 2017-04-04 LAB — BASIC METABOLIC PANEL
Anion gap: 9 (ref 5–15)
BUN: 32 mg/dL — ABNORMAL HIGH (ref 6–20)
CO2: 24 mmol/L (ref 22–32)
Calcium: 9 mg/dL (ref 8.9–10.3)
Chloride: 105 mmol/L (ref 101–111)
Creatinine, Ser: 1.45 mg/dL — ABNORMAL HIGH (ref 0.44–1.00)
GFR calc Af Amer: 38 mL/min — ABNORMAL LOW (ref >60)
GFR calc non Af Amer: 33 mL/min — ABNORMAL LOW (ref >60)
Glucose, Bld: 116 mg/dL — ABNORMAL HIGH (ref 65–99)
Potassium: 4.2 mmol/L (ref 3.5–5.1)
Sodium: 138 mmol/L (ref 135–145)

## 2017-04-04 LAB — I-STAT TROPONIN, ED: Troponin i, poc: 0.03 ng/mL (ref 0.00–0.08)

## 2017-04-04 NOTE — ED Triage Notes (Signed)
[facility]- pt coming from home with c/oo chest tightness that radiates to her back with exertion X1 year. She also reports intermittent SOB and vomiting. CP free with EMS. 22g LHand, pt took 500mg  of aspirin at home prior to Ems arrival. EMS vitals 148/104, HR 59, rr 20, SPO2 100%, CBG 154.

## 2017-04-05 ENCOUNTER — Inpatient Hospital Stay (HOSPITAL_COMMUNITY)
Admission: EM | Admit: 2017-04-05 | Discharge: 2017-04-09 | DRG: 247 | Disposition: A | Discharge status: Home / Self Care | Payer: Medicare Other | Attending: Cardiology | Admitting: Cardiology

## 2017-04-05 ENCOUNTER — Observation Stay (HOSPITAL_BASED_OUTPATIENT_CLINIC_OR_DEPARTMENT_OTHER): Payer: Medicare Other

## 2017-04-05 DIAGNOSIS — I200 Unstable angina: Secondary | ICD-10-CM

## 2017-04-05 DIAGNOSIS — D649 Anemia, unspecified: Secondary | ICD-10-CM | POA: Diagnosis not present

## 2017-04-05 DIAGNOSIS — R079 Chest pain, unspecified: Secondary | ICD-10-CM | POA: Diagnosis not present

## 2017-04-05 DIAGNOSIS — I160 Hypertensive urgency: Secondary | ICD-10-CM | POA: Diagnosis present

## 2017-04-05 DIAGNOSIS — N183 Chronic kidney disease, stage 3 unspecified: Secondary | ICD-10-CM | POA: Diagnosis present

## 2017-04-05 DIAGNOSIS — Z955 Presence of coronary angioplasty implant and graft: Secondary | ICD-10-CM

## 2017-04-05 DIAGNOSIS — N1832 Chronic kidney disease, stage 3b: Secondary | ICD-10-CM | POA: Diagnosis present

## 2017-04-05 LAB — TSH: TSH: 4.855 u[IU]/mL — ABNORMAL HIGH (ref 0.350–4.500)

## 2017-04-05 LAB — T4, FREE: Free T4: 0.92 ng/dL (ref 0.61–1.12)

## 2017-04-05 LAB — I-STAT TROPONIN, ED: Troponin i, poc: 0.02 ng/mL (ref 0.00–0.08)

## 2017-04-05 LAB — ACETAMINOPHEN LEVEL: Acetaminophen (Tylenol), Serum: 10 ug/mL (ref 10–30)

## 2017-04-05 LAB — TROPONIN I
Troponin I: <0.03 ng/mL (ref <0.03)
Troponin I: <0.03 ng/mL (ref <0.03)
Troponin I: <0.03 ng/mL (ref <0.03)

## 2017-04-05 LAB — SALICYLATE LEVEL: Salicylate Lvl: <7 mg/dL (ref 2.8–30.0)

## 2017-04-05 LAB — ECHOCARDIOGRAM COMPLETE

## 2017-04-05 MED ORDER — METOPROLOL TARTRATE 50 MG PO TABS
100.0000 mg | ORAL_TABLET | Freq: Two times a day (BID) | ORAL | Status: DC
  Filled 2017-04-05: qty 4

## 2017-04-05 MED ORDER — ONDANSETRON HCL 4 MG/2ML IJ SOLN: 4.0000 mg | Freq: Four times a day (QID) | INTRAMUSCULAR | Status: DC | PRN

## 2017-04-05 MED ORDER — PANTOPRAZOLE SODIUM 40 MG IV SOLR
40.0000 mg | Freq: Once | INTRAVENOUS | Status: AC
  Administered 2017-04-05: 40 mg via INTRAVENOUS
  Filled 2017-04-05: qty 40

## 2017-04-05 MED ORDER — DOXAZOSIN MESYLATE 8 MG PO TABS
8.0000 mg | ORAL_TABLET | Freq: Every day | ORAL | Status: DC
  Administered 2017-04-05: 8 mg via ORAL
  Filled 2017-04-05 (×2): qty 1

## 2017-04-05 MED ORDER — CYCLOBENZAPRINE HCL 10 MG PO TABS: 10.0000 mg | ORAL_TABLET | Freq: Three times a day (TID) | ORAL | Status: DC | PRN

## 2017-04-05 MED ORDER — ROSUVASTATIN CALCIUM 10 MG PO TABS
10.0000 mg | ORAL_TABLET | Freq: Every day | ORAL | Status: DC
  Administered 2017-04-05 – 2017-04-09 (×4): 10 mg via ORAL
  Filled 2017-04-05 (×4): qty 1

## 2017-04-05 MED ORDER — HYDRALAZINE HCL 20 MG/ML IJ SOLN
10.0000 mg | Freq: Once | INTRAMUSCULAR | Status: AC
  Administered 2017-04-05: 10 mg via INTRAVENOUS
  Filled 2017-04-05: qty 1

## 2017-04-05 MED ORDER — MORPHINE SULFATE (PF) 4 MG/ML IV SOLN: 2.0000 mg | INTRAVENOUS | Status: DC | PRN

## 2017-04-05 MED ORDER — ACETAMINOPHEN 325 MG PO TABS
650.0000 mg | ORAL_TABLET | ORAL | Status: DC | PRN
  Administered 2017-04-05 – 2017-04-06 (×2): 650 mg via ORAL
  Filled 2017-04-05 (×2): qty 2

## 2017-04-05 MED ORDER — GI COCKTAIL ~~LOC~~: 30.0000 mL | Freq: Four times a day (QID) | ORAL | Status: DC | PRN

## 2017-04-05 MED ORDER — SODIUM CHLORIDE 0.9 % WEIGHT BASED INFUSION
3.0000 mL/kg/h | INTRAVENOUS | Status: DC
  Administered 2017-04-06: 3 mL/kg/h via INTRAVENOUS

## 2017-04-05 MED ORDER — SODIUM CHLORIDE 0.9% FLUSH
3.0000 mL | Freq: Two times a day (BID) | INTRAVENOUS | Status: DC
  Administered 2017-04-06: 3 mL via INTRAVENOUS

## 2017-04-05 MED ORDER — HYDRALAZINE HCL 20 MG/ML IJ SOLN: 10.0000 mg | INTRAMUSCULAR | Status: DC | PRN

## 2017-04-05 MED ORDER — SODIUM CHLORIDE 0.9 % IV SOLN
250.0000 mL | INTRAVENOUS | Status: DC | PRN
Start: 2017-04-05 — End: 2017-04-06

## 2017-04-05 MED ORDER — ASPIRIN EC 81 MG PO TBEC
81.0000 mg | DELAYED_RELEASE_TABLET | Freq: Every day | ORAL | Status: DC
  Administered 2017-04-05 – 2017-04-06 (×2): 81 mg via ORAL
  Filled 2017-04-05 (×2): qty 1

## 2017-04-05 MED ORDER — ACETAMINOPHEN 325 MG PO TABS
650.0000 mg | ORAL_TABLET | ORAL | Status: DC | PRN
  Administered 2017-04-05: 650 mg via ORAL
  Filled 2017-04-05: qty 2

## 2017-04-05 MED ORDER — ENOXAPARIN SODIUM 40 MG/0.4ML ~~LOC~~ SOLN
40.0000 mg | SUBCUTANEOUS | Status: DC
  Administered 2017-04-05: 40 mg via SUBCUTANEOUS
  Filled 2017-04-05: qty 0.4

## 2017-04-05 MED ORDER — DULOXETINE HCL 20 MG PO CPEP
20.0000 mg | ORAL_CAPSULE | Freq: Every day | ORAL | Status: DC
  Administered 2017-04-06 – 2017-04-09 (×4): 20 mg via ORAL
  Filled 2017-04-05 (×5): qty 1

## 2017-04-05 MED ORDER — ASPIRIN 81 MG PO CHEW: 324.0000 mg | CHEWABLE_TABLET | Freq: Once | ORAL | Status: DC

## 2017-04-05 MED ORDER — HYDRALAZINE HCL 20 MG/ML IJ SOLN
5.0000 mg | INTRAMUSCULAR | Status: DC | PRN
  Administered 2017-04-05 – 2017-04-06 (×2): 5 mg via INTRAVENOUS
  Filled 2017-04-05 (×2): qty 1

## 2017-04-05 MED ORDER — SODIUM CHLORIDE 0.9% FLUSH
3.0000 mL | INTRAVENOUS | Status: DC | PRN
Start: 2017-04-05 — End: 2017-04-06

## 2017-04-05 MED ORDER — ACETAMINOPHEN ER 650 MG PO TBCR: 650.0000 mg | EXTENDED_RELEASE_TABLET | Freq: Four times a day (QID) | ORAL | Status: DC | PRN

## 2017-04-05 MED ORDER — SODIUM CHLORIDE 0.9 % WEIGHT BASED INFUSION
1.0000 mL/kg/h | INTRAVENOUS | Status: DC
Start: 2017-04-06 — End: 2017-04-06
  Administered 2017-04-06 (×2): 250 mL via INTRAVENOUS

## 2017-04-05 NOTE — ED Notes (Signed)
Admitting MD paged to [facility] @ 1128 to [phone].

## 2017-04-05 NOTE — Progress Notes (Signed)
Patient admitted after midnight, no further chest pain.  CE negative thus far-- NPO until seen by cardiology.  Patient denies previous cath/stress test.  Eulogio Bear DO

## 2017-04-05 NOTE — ED Notes (Signed)
RN called nuclear and vascular; no orders for stress test at this time.

## 2017-04-05 NOTE — ED Notes (Signed)
Cards at bedside

## 2017-04-05 NOTE — H&P (Signed)
History and Physical    Valerie Ramirez HUD:[id number] DOB: 1932-09-01 DOA: 04/05/2017  Referring MD/NP/PA: Dina Rich PCP: Abner Greenspan, MD  Patient coming from: Home   Chief Complaint: Chest pain  I have personally briefly reviewed patient's old medical records in [place]   HPI: Valerie Ramirez is a 83 y.o. female with medical history significant of HTN, osteoarthritis, gout, and GERD; who presents with complaints of intermittent chest pain with exertion over the last 6 months.  Patient was observed stopping multiple times while walking back into the house from being out in the yard by her daughter today.  She complained of substernal chest pressure as though a ton of bricks were sitting on her chest.  She had associated symptoms of nausea, vomiting, headache, and intermittent ankle swelling.  Symptoms normally go away with rest.  Patient reports taking thousand milligrams of aspirin as needed for chest and joint pain symptoms.  Notes utilizing 1 bottle of 500 mg aspirin 50 tablets in 1 week on average.  Also reports taking Tylenol and Aleve as needed for her aches and pains.  Otherwise, she reports taking all of her other regularly prescribed medications as advised.  ED Course: Upon admission into the emergency department patient was noted to be afebrile, pulse 49-65, respiration 12-18, blood pressure 125/53 to 233/89, O2 saturation 97-100% on room air.  Labs revealed negative troponins x2, hemoglobin 11.2, BUN 32, creatinine 1.45, glucose 116.  Chest x-ray showed no acute abnormalities.  TRH called to admit for need of further workup.  Review of Systems  Constitutional: Positive for malaise/fatigue. Negative for chills, diaphoresis and fever.  HENT: Negative for ear discharge and nosebleeds.   Eyes: Negative for pain and discharge.  Respiratory: Positive for shortness of breath. Negative for cough and sputum production.   Cardiovascular: Positive for chest pain and leg swelling.    Gastrointestinal: Positive for nausea and vomiting. Negative for abdominal pain and diarrhea.       Positive for belching  Genitourinary: Negative for dysuria and frequency.  Musculoskeletal: Positive for joint pain. Negative for falls.  Skin: Negative for itching and rash.  Neurological: Positive for headaches. Negative for focal weakness and loss of consciousness.  Psychiatric/Behavioral: Negative for hallucinations and substance abuse.    Past Medical History:  Diagnosis Date  . Arthritis    OA  . Cancer ([place])    endometrial  . GERD (gastroesophageal reflux disease)   . Hyperlipidemia   . Hypertension   . Hypothyroid   . Kidney stone   . Shortness of breath dyspnea     Past Surgical History:  Procedure Laterality Date  . ANTERIOR CERVICAL DECOMP/DISCECTOMY FUSION [name] 08/18/2015   Procedure: C5-6, C6-7 Anterior Cervical Discectomy and Fusion, Allograft, Plate;  Surgeon: Marybelle Killings, MD;  Location: [facility];  Service: Orthopedics;  Laterality: [name];  . bone spur removed Right    shoulder  . BOWEL RESECTION  07/04/2012   Procedure: SMALL BOWEL RESECTION;  Surgeon: Alvino Chapel, MD;  Location: [facility];  Service: Gynecology;;  . CHOLECYSTECTOMY    . DILATION AND CURETTAGE OF UTERUS  1999  . EYE SURGERY Bilateral    cataracts  . HAND SURGERY  12/2008   after hand fx/due to fall  . JOINT REPLACEMENT Bilateral 07/1998   knees  . KNEE ARTHROPLASTY  05/23/1998   total right  . LAPAROTOMY Bilateral 07/04/2012   Procedure: EXPLORATORY LAPAROTOMY TOTAL ABDOMINAL HYSTERECTOMY BILATERAL SALPINGO-OOPHORECTOMY with small bowel resection ;  Surgeon: Alvino Chapel, MD;  Location: [facility];  Service: Gynecology;  Laterality: Bilateral;  . polyp removal  1999  . TUBAL LIGATION       reports that  has never smoked. she has never used smokeless tobacco. She reports that she does not drink alcohol or use drugs.  Allergies  Allergen Reactions  . Allopurinol Nausea Only  .  Amoxicillin-Pot Clavulanate     REACTION: stomach upset  . Lipitor [Atorvastatin Calcium] Other (See Comments)    Leg ache and ache all over  . Simvastatin     REACTION: muscle pain  . Valium [Diazepam] Other (See Comments)    "drives me crazy"  . Tape Itching and Rash    "Cannot use paper tape" --- also causes blisters. Use plastic tape    Family History  Problem Relation Age of Onset  . Cancer Brother        LEUKEMIA  . Cancer Brother        LUNG  . Cancer Brother        STOMACH  . Heart disease Sister        CABG  . Stroke Sister   . Heart disease Brother        CABG    Prior to Admission medications   Medication Sig Start Date End Date Taking? Authorizing Provider  acetaminophen (TYLENOL) 650 MG CR tablet Take 650 mg by mouth every 8 (eight) hours as needed for pain.   Yes [provider]  Aspirin-Caffeine (BAYER BACK & BODY PO) Take 2 tablets by mouth every 4 (four) hours as needed (back pain).    Yes [provider]  colchicine 0.6 MG tablet TAKE ONE TABLET TWO TIMES A DAY WITH FOOD AS NEEDED GOUT 02/17/17  Yes Tower, Wynelle Fanny, MD  cyclobenzaprine (FLEXERIL) 10 MG tablet Take 10 mg by mouth 3 (three) times daily as needed for muscle spasms.   Yes [provider]  doxazosin (CARDURA) 8 MG tablet TAKE 1 TABLET (8 MG TOTAL) BY MOUTH AT BEDTIME. 09/15/16  Yes Tower, Marne A, MD  DULoxetine (CYMBALTA) 20 MG capsule TAKE 1 CAPSULE (20 MG TOTAL) BY MOUTH DAILY. 05/21/16  Yes Tower, Marne A, MD  ECHINACEA PO Take 760 mg by mouth 2 (two) times daily.   Yes [provider]  lisinopril (PRINIVIL,ZESTRIL) 5 MG tablet TAKE 1 TABLET (5 MG TOTAL) BY MOUTH DAILY. 09/15/16  Yes Tower, Wynelle Fanny, MD  metoprolol tartrate (LOPRESSOR) 100 MG tablet TAKE 1 TABLET (100 MG TOTAL) BY MOUTH 2 (TWO) TIMES DAILY. 09/15/16  Yes Tower, Wynelle Fanny, MD  naproxen sodium (ALEVE) 220 MG tablet Take 220 mg by mouth daily as needed (back & muscle pain).   Yes [provider]    vitamin B-12 (CYANOCOBALAMIN) 1000 MCG tablet Take 1,000 mcg by mouth daily.   Yes [provider]  amLODipine (NORVASC) 10 MG tablet Take 1 tablet (10 mg total) by mouth daily. Patient not taking: Reported on 04/04/2017 04/30/16   Eugenie Filler, MD  HYDROcodone-acetaminophen (NORCO/VICODIN) 5-325 MG tablet Take 1-2 tablets by mouth every 6 (six) hours as needed. Patient not taking: Reported on 04/04/2017 04/29/16   Eugenie Filler, MD  loperamide (IMODIUM) 2 MG capsule Take 1 capsule (2 mg total) by mouth as needed for diarrhea or loose stools. Patient not taking: Reported on 04/04/2017 04/29/16   Eugenie Filler, MD    Physical Exam:  Constitutional: Elderly female in no acute distress, and able to  follow commands Vitals:   04/05/17 0115 04/05/17 0130 04/05/17 0145 04/05/17 0200  BP: (!) 194/70 (!) 159/62 (!) 137/58 (!) 125/53  Pulse: (!) 49 (!) 58 (!) 55 60  Resp: 17 14 13 12   Temp:      TempSrc:      SpO2: 99% 98% 98% 98%  Weight:      Height:       Eyes: PERRL, lids and conjunctivae normal ENMT: Mucous membranes are dry. Posterior pharynx clear of any exudate or lesions.  Neck: normal, supple, no masses, no thyromegaly Respiratory: clear to auscultation bilaterally, no wheezing, no crackles. Normal respiratory effort. No accessory muscle use.  Cardiovascular: Bradycardic, no murmurs / rubs / gallops. Trace lower extremity edema. 2+ pedal pulses. No carotid bruits.  Abdomen: no tenderness, no masses palpated. No hepatosplenomegaly. Bowel sounds positive.  Musculoskeletal: no clubbing / cyanosis. No joint deformity upper and lower extremities. Good ROM, no contractures. Normal muscle tone.  Skin: no rashes, lesions, ulcers. No induration Neurologic: CN 2-12 grossly intact. Sensation intact, DTR normal. Strength 5/5 in all 4.  Psychiatric: Normal judgment and insight. Alert and oriented x 3. Normal mood.     Labs on Admission: I have personally reviewed  following labs and imaging studies  CBC: Recent Labs  Lab 04/04/17 1817  WBC 9.6  HGB 11.2*  HCT 35.9*  MCV 85.1  PLT 174   Basic Metabolic Panel: Recent Labs  Lab 04/04/17 1817  NA 138  K 4.2  CL 105  CO2 24  GLUCOSE 116*  BUN 32*  CREATININE 1.45*  CALCIUM 9.0   GFR: Estimated Creatinine Clearance: 33.4 mL/min (A) (by C-G formula based on SCr of 1.45 mg/dL (H)). Liver Function Tests: No results for input(s): AST, ALT, ALKPHOS, BILITOT, PROT, ALBUMIN in the last 168 hours. No results for input(s): LIPASE, AMYLASE in the last 168 hours. No results for input(s): AMMONIA in the last 168 hours. Coagulation Profile: No results for input(s): INR, PROTIME in the last 168 hours. Cardiac Enzymes: No results for input(s): CKTOTAL, CKMB, CKMBINDEX, TROPONINI in the last 168 hours. BNP (last 3 results) No results for input(s): PROBNP in the last 8760 hours. HbA1C: No results for input(s): HGBA1C in the last 72 hours. CBG: No results for input(s): GLUCAP in the last 168 hours. Lipid Profile: No results for input(s): CHOL, HDL, LDLCALC, TRIG, CHOLHDL, LDLDIRECT in the last 72 hours. Thyroid Function Tests: No results for input(s): TSH, T4TOTAL, FREET4, T3FREE, THYROIDAB in the last 72 hours. Anemia Panel: No results for input(s): VITAMINB12, FOLATE, FERRITIN, TIBC, IRON, RETICCTPCT in the last 72 hours. Urine analysis:    Component Value Date/Time   COLORURINE AMBER (A) 04/25/2016 1704   APPEARANCEUR HAZY (A) 04/25/2016 1704   LABSPEC 1.025 04/25/2016 1704   PHURINE 5.0 04/25/2016 1704   GLUCOSEU NEGATIVE 04/25/2016 1704   HGBUR NEGATIVE 04/25/2016 1704   BILIRUBINUR Negative 09/15/2016 1214   KETONESUR NEGATIVE 04/25/2016 1704   PROTEINUR 15 mg/dL 09/15/2016 1214   PROTEINUR 100 (A) 04/25/2016 1704   UROBILINOGEN 0.2 09/15/2016 1214   UROBILINOGEN 1.0 06/20/2014 1030   NITRITE Negative 09/15/2016 1214   NITRITE NEGATIVE 04/25/2016 1704   LEUKOCYTESUR Negative  09/15/2016 1214   Sepsis Labs: No results found for this or any previous visit (from the past 240 hour(s)).   Radiological Exams on Admission: Dg Chest 2 View  Result Date: 04/04/2017 CLINICAL DATA:  Chest pain EXAM: CHEST  2 VIEW COMPARISON:  08/08/2015 FINDINGS: Heart size mildly enlarged.  Atherosclerotic aorta. Negative for heart failure. Lungs are clear without infiltrate or effusion. IMPRESSION: No active cardiopulmonary disease. Electronically Signed   By: Franchot Gallo M.D.   On: 04/04/2017 19:20    EKG: Independently reviewed.  Sinus bradycardia at 54 bpm with lateral T wave inversion.  Assessment/Plan Chest pain with exertion: Acute.  Patient presents with complaints of chest pain with exertion improves with rest.  Initial troponins negative x2 EKG with some lateral T wave inversion that appears new. - Admit to a telemetry bed - Chest pain order set initiated - Trend cardiac troponins - Check echocardiogram - Check TSH - Message a sent for cardiology to evaluate in a.m.  Hypertensive urgency: Acute.  Patient's initial blood pressure was elevated up to 233/89.  Patient noted not to be taking amlodipine.  Question of elevated blood pressure secondary to his use of NSAIDs. - Continue metoprolol and doxazosin  - Hydralazine IV prn > sBP - Initially held lisinopril due to kidney function  Chronic kidney disease stage III: Creatinine noted to be 1.45 with BUN 34.  Possible prerenal cause of symptoms.  Creatinine appears close to previous kidney function back in June.  - IV fluids - Recheck BMP in a.m. - Avoid nephrotoxic agents such as NSAIDs - Counseled patient on need to avoid overuse of NSAIDs and ASA  Normocytic normochromic anemia - Recheck CBC in a.m.  History of hypothyroidism: Patient currently not on any thyroid medications.  - Check TSH and free T4  DVT prophylaxis: lovenox Code Status: Full Family Communication: Discussed plan of care with the patient and  family present at bedside Disposition Plan: Discharge home in 1-2 days if workup negative Consults called: none  Admission status: observation  Norval Morton MD [facility] Pager [phone]   If 7PM-7AM, please contact night-coverage [email or web address] Password [email or web address]  04/05/2017, 2:39 AM

## 2017-04-05 NOTE — ED Provider Notes (Signed)
[facility] EMERGENCY DEPARTMENT Provider Note   CSN: [id number] Arrival date & time: 04/04/17  1804     History   Chief Complaint Chief Complaint  Patient presents with  . Chest Pain    HPI LATEEFAH MALLERY is a 83 y.o. female.  HPI  This is an 83 year old female with history of hypertension, hyperlipidemia who presents with chest pain and nausea.  Most of the history is reported by the patient's daughter.  She states that her mother walked out to the chicken house today and on her way back had to stop multiple times secondary to chest pain and nausea.  She had one episode of vomiting.  Upon further questioning, she had a similar episode that was more severe yesterday.  Patient reports that she has had similar exertional symptoms over the last 6 months.  She is currently chest pain-free.  She occasionally has lower extremity edema.  No shortness of breath, cough, fever.  She has never had a cardiac stress or catheterization.  She takes multiple blood pressure medications and has difficult to control blood pressure.  Past Medical History:  Diagnosis Date  . Arthritis    OA  . Cancer ([place])    endometrial  . GERD (gastroesophageal reflux disease)   . Hyperlipidemia   . Hypertension   . Hypothyroid   . Kidney stone   . Shortness of breath dyspnea     Patient Active Problem List   Diagnosis Date Noted  . Thoracic back pain 09/15/2016  . History of radius fracture 06/01/2016  . Hyponatremia 04/25/2016  . AKI (acute kidney injury) ([place]) 04/25/2016  . Leucocytosis 04/25/2016  . Status post cervical spinal fusion 08/18/2015  . Osteoarthritis, hip, bilateral 08/12/2014  . Hearing loss in right ear 06/12/2014  . Colon cancer screening 08/10/2013  . Seborrheic keratoses, inflamed 08/10/2013  . History of fall 04/10/2013  . Encounter for Medicare annual wellness exam 08/08/2012  . Gout 07/14/2012  . History of endometrial cancer 05/25/2012  . Degenerative  joint disease of cervical spine 05/03/2011  . Other screening mammogram 03/31/2011  . Hyperglycemia 09/26/2007  . Morbid obesity ([place]) 07/12/2007  . GOITER, MULTINODULAR 07/11/2007  . Essential hypertension 07/11/2007  . History of cardiomyopathy 07/11/2007  . Osteoarthritis 07/11/2007  . 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 Hypothyroidism 10/05/2006  . HYPERCHOLESTEROLEMIA, PURE 10/05/2006    Past Surgical History:  Procedure Laterality Date  . ANTERIOR CERVICAL DECOMP/DISCECTOMY FUSION [name] 08/18/2015   Procedure: C5-6, C6-7 Anterior Cervical Discectomy and Fusion, Allograft, Plate;  Surgeon: Marybelle Killings, MD;  Location: [facility];  Service: Orthopedics;  Laterality: [name];  . bone spur removed Right    shoulder  . BOWEL RESECTION  07/04/2012   Procedure: SMALL BOWEL RESECTION;  Surgeon: Alvino Chapel, MD;  Location: [facility];  Service: Gynecology;;  . CHOLECYSTECTOMY    . DILATION AND CURETTAGE OF UTERUS  1999  . EYE SURGERY Bilateral    cataracts  . HAND SURGERY  12/2008   after hand fx/due to fall  . JOINT REPLACEMENT Bilateral 07/1998   knees  . KNEE ARTHROPLASTY  05/23/1998   total right  . LAPAROTOMY Bilateral 07/04/2012   Procedure: EXPLORATORY LAPAROTOMY TOTAL ABDOMINAL HYSTERECTOMY BILATERAL SALPINGO-OOPHORECTOMY with small bowel resection ;  Surgeon: Alvino Chapel, MD;  [facility];  Service: Gynecology;  Laterality: Bilateral;  . polyp removal  1999  . TUBAL LIGATION      OB History    [place]  Para Term Preterm AB Living   5 2     3 2    SAB TAB Ectopic Multiple Live Births   3               Home Medications    Prior to Admission medications   Medication Sig Start Date End Date Taking? Authorizing Provider  acetaminophen (TYLENOL) 650 MG CR tablet Take 650 mg by mouth every 8 (eight) hours as needed for pain.   Yes [provider]  Aspirin-Caffeine (BAYER BACK & BODY PO) Take 2 tablets by mouth every 4 (four) hours as  needed (back pain).    Yes [provider]  colchicine 0.6 MG tablet TAKE ONE TABLET TWO TIMES A DAY WITH FOOD AS NEEDED GOUT 02/17/17  Yes Tower, Wynelle Fanny, MD  cyclobenzaprine (FLEXERIL) 10 MG tablet Take 10 mg by mouth 3 (three) times daily as needed for muscle spasms.   Yes [provider]  doxazosin (CARDURA) 8 MG tablet TAKE 1 TABLET (8 MG TOTAL) BY MOUTH AT BEDTIME. 09/15/16  Yes Tower, Marne A, MD  DULoxetine (CYMBALTA) 20 MG capsule TAKE 1 CAPSULE (20 MG TOTAL) BY MOUTH DAILY. 05/21/16  Yes Tower, Marne A, MD  ECHINACEA PO Take 760 mg by mouth 2 (two) times daily.   Yes [provider]  lisinopril (PRINIVIL,ZESTRIL) 5 MG tablet TAKE 1 TABLET (5 MG TOTAL) BY MOUTH DAILY. 09/15/16  Yes Tower, Wynelle Fanny, MD  metoprolol tartrate (LOPRESSOR) 100 MG tablet TAKE 1 TABLET (100 MG TOTAL) BY MOUTH 2 (TWO) TIMES DAILY. 09/15/16  Yes Tower, Wynelle Fanny, MD  naproxen sodium (ALEVE) 220 MG tablet Take 220 mg by mouth daily as needed (back & muscle pain).   Yes [provider]  vitamin B-12 (CYANOCOBALAMIN) 1000 MCG tablet Take 1,000 mcg by mouth daily.   Yes [provider]  amLODipine (NORVASC) 10 MG tablet Take 1 tablet (10 mg total) by mouth daily. Patient not taking: Reported on 04/04/2017 04/30/16   Eugenie Filler, MD  HYDROcodone-acetaminophen (NORCO/VICODIN) 5-325 MG tablet Take 1-2 tablets by mouth every 6 (six) hours as needed. Patient not taking: Reported on 04/04/2017 04/29/16   Eugenie Filler, MD  loperamide (IMODIUM) 2 MG capsule Take 1 capsule (2 mg total) by mouth as needed for diarrhea or loose stools. Patient not taking: Reported on 04/04/2017 04/29/16   Eugenie Filler, MD    Family History Family History  Problem Relation Age of Onset  . Cancer Brother        LEUKEMIA  . Cancer Brother        LUNG  . Cancer Brother        STOMACH  . Heart disease Sister        CABG  . Stroke Sister   . Heart disease Brother        CABG    Social  History Social History   Tobacco Use  . Smoking status: Never Smoker  . Smokeless tobacco: Never Used  Substance Use Topics  . Alcohol use: No    Alcohol/week: 0.0 oz  . Drug use: No     Allergies   Allopurinol; Amoxicillin-pot clavulanate; Lipitor [atorvastatin calcium]; Simvastatin; Valium [diazepam]; and Tape   Review of Systems Review of Systems  Constitutional: Negative for fever.  Respiratory: Negative for shortness of breath.   Cardiovascular: Positive for chest pain and leg swelling.  Gastrointestinal: Positive for nausea and vomiting. Negative for abdominal pain and diarrhea.  Genitourinary: Negative for  dysuria.  All other systems reviewed and are negative.    Physical Exam Updated Vital Signs BP (!) 194/69 (BP Location: Left Arm)   Pulse (!) 51   Temp 98 F (36.7 C) (Oral)   Resp 13   Ht 5\' 2"  (1.575 m)   Wt 101.6 kg (224 lb)   SpO2 97%   BMI 40.97 kg/m   Physical Exam  Constitutional: She is oriented to person, place, and time. She appears well-developed and well-nourished.  Elderly, overweight, no acute distress  HENT:  Head: Normocephalic and atraumatic.  Cardiovascular: Normal rate, regular rhythm and normal heart sounds.  Pulmonary/Chest: Effort normal. No respiratory distress. She has no wheezes.  Abdominal: Soft. Bowel sounds are normal.  Musculoskeletal:       Right lower leg: She exhibits edema.       Left lower leg: She exhibits edema.  Neurological: She is alert and oriented to person, place, and time.  Skin: Skin is warm and dry.  Psychiatric: She has a normal mood and affect.  Nursing note and vitals reviewed.    ED Treatments / Results  Labs (all labs ordered are listed, but only abnormal results are displayed) Labs Reviewed  BASIC METABOLIC PANEL - Abnormal; Notable for the following components:      Result Value   Glucose, Bld 116 (*)    BUN 32 (*)    Creatinine, Ser 1.45 (*)    GFR calc non Af Amer 33 (*)    GFR calc Af  Amer 38 (*)    All other components within normal limits  CBC - Abnormal; Notable for the following components:   Hemoglobin 11.2 (*)    HCT 35.9 (*)    RDW 17.4 (*)    All other components within normal limits  I-STAT TROPONIN, ED  I-STAT TROPONIN, ED    EKG  EKG Interpretation  Date/Time:  Tuesday April 05 2017 00:38:10 EST Ventricular Rate:  54 PR Interval:    QRS Duration: 105 QT Interval:  446 QTC Calculation: 423 R Axis:   48 Text Interpretation:  Sinus arrhythmia RSR' in V1 or V2, right VCD or RVH Repol abnrm suggests ischemia, lateral leads Confirmed by Thayer Jew [phone]) on 04/05/2017 1:10:24 AM       Radiology Dg Chest 2 View  Result Date: 04/04/2017 CLINICAL DATA:  Chest pain EXAM: CHEST  2 VIEW COMPARISON:  08/08/2015 FINDINGS: Heart size mildly enlarged. Atherosclerotic aorta. Negative for heart failure. Lungs are clear without infiltrate or effusion. IMPRESSION: No active cardiopulmonary disease. Electronically Signed   By: Franchot Gallo M.D.   On: 04/04/2017 19:20    Procedures Procedures (including critical care time)  Medications Ordered in ED Medications  hydrALAZINE (APRESOLINE) injection 10 mg (not administered)  aspirin chewable tablet 324 mg (not administered)     Initial Impression / Assessment and Plan / ED Course  I have reviewed the triage vital signs and the nursing notes.  Pertinent labs & imaging results that were available during my care of the patient were reviewed by me and considered in my medical decision making (see chart for details).     Patient presents with chest pain and nausea.  Currently pain-free and without symptoms.  History is very concerning given the exertional nature of symptoms.  It appears she has had ongoing symptoms over the last 6 months at least.  EKG shows nonspecific T wave inversions laterally.  She is hypertensive initially with a systolic blood pressure of 230.  Currently  blood pressure is 194/69.   She has taken her blood pressure medications today.  She is currently pain-free.  She is given a full dose aspirin.  Initial troponin is negative.  However, given her risk factors and history, will admit for further cardiac evaluation with concerns for angina and hypertensive urgency.  She was given IV hydralazine for blood pressure.    Final Clinical Impressions(s) / ED Diagnoses   Final diagnoses:  Exertional chest pain  Hypertensive urgency    ED Discharge Orders    None       Merryl Hacker, MD 04/05/17 0111

## 2017-04-05 NOTE — Progress Notes (Signed)
  Echocardiogram 2D Echocardiogram has been performed.  Valerie Ramirez 04/05/2017, 3:54 PM

## 2017-04-05 NOTE — ED Notes (Addendum)
Echo at bedside

## 2017-04-05 NOTE — ED Notes (Signed)
Pt placed in hospital bed for comfort.

## 2017-04-05 NOTE — ED Notes (Signed)
Asked Network engineer to page Dr. Eliseo Squires

## 2017-04-05 NOTE — ED Notes (Signed)
Heart PA at bedside.

## 2017-04-05 NOTE — Consult Note (Signed)
Cardiology Consultation:   Patient ID: MINYON BILLITER; [id number]; 04/08/1933   Admit date: 04/05/2017 Date of Consult: 04/05/2017  Primary Care Provider: Abner Greenspan, MD Primary Cardiologist: New to Dr. Martinique   Patient Profile:   Valerie Ramirez is a 83 y.o. female with a hx of  With hx of HTN, HLD, hypothyroidism and secondary exposure to tobacco smoking who is being seen today for the evaluation of chest pain  at the request of Dr. Tamala Julian.   No prior cardiac hx. Sister and brother has hx of CABG.   History of Present Illness:   Valerie Ramirez presented with greater than 6 months hx of DOE and chest pressure. 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 She needs to take frequent stop/rest due to dyspnea and substernal chest pressure. Intermittent radiation to neck and bilateral shoulder with episodes of vomiting. Worse episode 2022-08-12. Husband passed away in 01/10/2023. She lives by her self. This been happening everyday. Sister noted her symptoms yesterday and EMS was called. Her chest pressure lasted for 45 minutes. She takes multiple doses of ASA and Tylenol for chronic back and knee pain. She uses at least 1 bottle of 50 tables of 500mg  ASA/week. She cannot lay flat due to dyspnea. Has LE edema and dizziness. Chronic fatigue and tiredness. No syncope, palpitation or melena.    EKG showed interior lateral ST changes, more prominent than prior EKG- personally reviewd. BP 218/186 on arrival. Compliant with medications. Held home ACE due to elevated Scr. f Chest pain free since admission. CXR without cardiopulmonary disease.   Past Medical History:  Diagnosis Date  . Arthritis    OA  . Cancer ([place])    endometrial  . GERD (gastroesophageal reflux disease)   . Hyperlipidemia   . Hypertension   . Hypothyroid   . Kidney stone   . Shortness of breath dyspnea     Past Surgical History:  Procedure Laterality Date  . ANTERIOR CERVICAL DECOMP/DISCECTOMY FUSION [name] 08/18/2015   Procedure: C5-6, C6-7 Anterior Cervical Discectomy and Fusion, Allograft, Plate;  Surgeon: Marybelle Killings, MD;  Location: [place];  Service: Orthopedics;  Laterality: [name];  . bone spur removed Right    shoulder  . BOWEL RESECTION  07/04/2012   Procedure: SMALL BOWEL RESECTION;  Surgeon: Alvino Chapel, MD;  Location: [facility];  Service: Gynecology;;  . CHOLECYSTECTOMY    . DILATION AND CURETTAGE OF UTERUS  1999  . EYE SURGERY Bilateral    cataracts  . HAND SURGERY  12/2008   after hand fx/due to fall  . JOINT REPLACEMENT Bilateral 07/1998   knees  . KNEE ARTHROPLASTY  05/23/1998   total right  . LAPAROTOMY Bilateral 07/04/2012   Procedure: EXPLORATORY LAPAROTOMY TOTAL ABDOMINAL HYSTERECTOMY BILATERAL SALPINGO-OOPHORECTOMY with small bowel resection ;  Surgeon: Alvino Chapel, MD;  Location: [facility];  Service: Gynecology;  Laterality: Bilateral;  . polyp removal  1999  . TUBAL LIGATION       Inpatient Medications: Scheduled Meds: . aspirin  324 mg Oral Once  . doxazosin  8 mg Oral QHS  . DULoxetine  20 mg Oral Daily  . enoxaparin (LOVENOX) injection  40 mg Subcutaneous Q24H  . metoprolol tartrate  100 mg Oral BID   Continuous Infusions:  PRN Meds: acetaminophen, cyclobenzaprine, gi cocktail, hydrALAZINE, morphine injection, ondansetron (ZOFRAN) IV  Allergies:    Allergies  Allergen Reactions  . Allopurinol Nausea Only  . Amoxicillin-Pot Clavulanate     REACTION:  stomach upset  . Lipitor [Atorvastatin Calcium] Other (See Comments)    Leg ache and ache all over  . Simvastatin     REACTION: muscle pain  . Valium [Diazepam] Other (See Comments)    "drives me crazy"  . Tape Itching and Rash    "Cannot use paper tape" --- also causes blisters. Use plastic tape    Social History:   Social History   Socioeconomic History  . Marital status: Married    Spouse name: Not on file  . Number of children: Not on file  . Years of education: Not on file  . Highest  education level: Not on file  Social Needs  . Financial resource strain: Not on file  . Food insecurity - worry: Not on file  . Food insecurity - inability: Not on file  . Transportation needs - medical: Not on file  . Transportation needs - non-medical: Not on file  Occupational History  . Not on file  Tobacco Use  . Smoking status: Never Smoker  . Smokeless tobacco: Never Used  Substance and Sexual Activity  . Alcohol use: No    Alcohol/week: 0.0 oz  . Drug use: No  . Sexual activity: No  Other Topics Concern  . Not on file  Social History Narrative  . Not on file    Family History:    Family History  Problem Relation Age of Onset  . Cancer Brother        LEUKEMIA  . Cancer Brother        LUNG  . Cancer Brother        STOMACH  . Heart disease Sister        CABG  . Stroke Sister   . Heart disease Brother        CABG     ROS:  Please see the history of present illness.  ROS All other ROS reviewed and negative.     Physical Exam/Data:   Vitals:   04/05/17 0700 04/05/17 0730 04/05/17 0800 04/05/17 0830  BP: 129/61 (!) 147/61 (!) 140/51 (!) 139/51  Pulse: 66 64 68 74  Resp: 19 19 16 10   Temp:      TempSrc:      SpO2: 97% 93% 95% 97%  Weight:      Height:       No intake or output data in the 24 hours ending 04/05/17 1227 Filed Weights   04/04/17 1812  Weight: 224 lb (101.6 kg)   Body mass index is 40.97 kg/m.  General:  Well nourished, well developed, in no acute distress HEENT: normal Lymph: no adenopathy Neck: no JVD Endocrine:  No thryomegaly Vascular: No carotid bruits; FA pulses 2+ bilaterally without bruits  Cardiac:  normal S1, S2; RRR; mild systolic murmur  Lungs:  clear to auscultation bilaterally, no wheezing, rhonchi or rales  Abd: soft, nontender, no hepatomegaly  Ext: no edema Musculoskeletal:  No deformities, BUE and BLE strength normal and equal Skin: warm and dry  Neuro:  CNs 2-12 intact, no focal abnormalities noted Psych:   Normal affect    Telemetry:  Telemetry was personally reviewed and demonstrates:  Sinus bradycardia at 60s  Relevant CV Studies: Pending echo  Laboratory Data:  Chemistry Recent Labs  Lab 04/04/17 1817  NA 138  K 4.2  CL 105  CO2 24  GLUCOSE 116*  BUN 32*  CREATININE 1.45*  CALCIUM 9.0  GFRNONAA 33*  GFRAA 38*  ANIONGAP 9    No  results for input(s): PROT, ALBUMIN, AST, ALT, ALKPHOS, BILITOT in the last 168 hours. Hematology Recent Labs  Lab 04/04/17 1817  WBC 9.6  RBC 4.22  HGB 11.2*  HCT 35.9*  MCV 85.1  MCH 26.5  MCHC 31.2  RDW 17.4*  PLT 206   Cardiac Enzymes Recent Labs  Lab 04/05/17 0334 04/05/17 0555 04/05/17 0843  TROPONINI <0.03 <0.03 <0.03    Recent Labs  Lab 04/04/17 1859 04/05/17 0100  TROPIPOC 0.03 0.02    Radiology/Studies:  Dg Chest 2 View  Result Date: 04/04/2017 CLINICAL DATA:  Chest pain EXAM: CHEST  2 VIEW COMPARISON:  08/08/2015 FINDINGS: Heart size mildly enlarged. Atherosclerotic aorta. Negative for heart failure. Lungs are clear without infiltrate or effusion. IMPRESSION: No active cardiopulmonary disease. Electronically Signed   By: Franchot Gallo M.D.   On: 04/04/2017 19:20    Assessment and Plan:   1. Accelerating angina - Ongoing for at least 6 months. Worse episode Sunday. Chest pain free since arrival. Pending echo.  - EKG with more prominent ST depression inferior laterally. Enzyme remained negative.  - Start ASA 81mg  qd and Lipitor 80mg  qd. Check lipid profile.  07/26/2016: Cholesterol 215; HDL 67.40; LDL Cholesterol 132; Triglycerides 77.0; VLDL 15.4  - Cath tomorrow 4pm.  2. Sinus bradycardia - On metoprolol 100mg  BID at home. Held AM dose.  Rate in 60s. Will discontinue to observe rate as some of her fatigue is due to bradycardia. Consider adding coreg pending echocardiogram.   3. Hypertensive urgency - Uses multiple doses of NSAIDS every day. BP improved after IV hydralazine 10mg . Lisinopril 5mg  held due to  AKI.   4. Acute on CKD stage III - Baseline Scr around 1.2-1.3. Held ACE.   5. Hypothyroidism - Per primary team  For questions or updates, please contact [place] Please consult [email or web address] for contact info under Cardiology/STEMI.   Valerie Ramirez, [place]  04/05/2017 12:27 PM   Patient seen and examined and history reviewed. Agree with above findings and plan. Very pleasant 83 yo WF with history of HTN, HLD, family history of CAD presents with symptoms of classic progressive angina. She was severely hypertensive on admission. BP has improved. 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   On exam she is an obese WF in NAD. No JVD or bruits. Lungs are clear.  CV RRR with gr 1/6 SEM. No gallop or S3 Pulses 2+ = no edema  Ecg shows NSR with LVH and increased ST depression in the inferolateral leads compared to 2017. I have personally reviewed and interpreted this study.   Troponin level negative x 3.   CXR shows NAD. I have personally reviewed and interpreted this study.  Impression: 1. Accelerating/unstable angina. Symptoms are classic. Multiple risk factors for CAD. Uncontrolled HTN may be a factor. Recommend continued BP control. Beta blocker on hold for bradycardia. May be able to start Coreg if HR improves. Continue ASA 81 mg daily. Start statin. Discussed further ischemic evaluation with cardiac cath versus stress testing. I recommend cardiac cath given progressive symptoms. Will need to hydrate prior to cath for renal insufficiency. The procedure and risks were reviewed including but not limited to death, myocardial infarction, stroke, arrythmias, bleeding, transfusion, emergency surgery, dye allergy, or renal dysfunction. The patient voices understanding and is agreeable to proceed. Will check Echo as well for LV function. 2. Uncontrolled HTN. Exacerbated by high doses of ASA therapy. BP is improving. 3. CKD stage 3.    Peter Martinique,  [place] 04/05/2017  1:23 PM

## 2017-04-06 ENCOUNTER — Other Ambulatory Visit: Payer: Self-pay

## 2017-04-06 ENCOUNTER — Inpatient Hospital Stay (HOSPITAL_COMMUNITY)
Admission: EM | Disposition: A | Discharge status: Home / Self Care | Payer: Self-pay | Source: Home / Self Care | Attending: Cardiology

## 2017-04-06 ENCOUNTER — Ambulatory Visit (HOSPITAL_COMMUNITY): Admission: RE | Admit: 2017-04-06 | Payer: Medicare Other | Source: Ambulatory Visit | Admitting: Cardiovascular Disease

## 2017-04-06 DIAGNOSIS — N179 Acute kidney failure, unspecified: Secondary | ICD-10-CM | POA: Diagnosis present

## 2017-04-06 DIAGNOSIS — Z96653 Presence of artificial knee joint, bilateral: Secondary | ICD-10-CM | POA: Diagnosis present

## 2017-04-06 DIAGNOSIS — I495 Sick sinus syndrome: Secondary | ICD-10-CM

## 2017-04-06 DIAGNOSIS — E039 Hypothyroidism, unspecified: Secondary | ICD-10-CM | POA: Diagnosis present

## 2017-04-06 DIAGNOSIS — H9191 Unspecified hearing loss, right ear: Secondary | ICD-10-CM | POA: Diagnosis present

## 2017-04-06 DIAGNOSIS — R079 Chest pain, unspecified: Secondary | ICD-10-CM | POA: Diagnosis not present

## 2017-04-06 DIAGNOSIS — M109 Gout, unspecified: Secondary | ICD-10-CM | POA: Diagnosis present

## 2017-04-06 DIAGNOSIS — I25110 Atherosclerotic heart disease of native coronary artery with unstable angina pectoris: Secondary | ICD-10-CM | POA: Diagnosis not present

## 2017-04-06 DIAGNOSIS — Z981 Arthrodesis status: Secondary | ICD-10-CM | POA: Diagnosis not present

## 2017-04-06 DIAGNOSIS — D649 Anemia, unspecified: Secondary | ICD-10-CM | POA: Diagnosis not present

## 2017-04-06 DIAGNOSIS — I160 Hypertensive urgency: Secondary | ICD-10-CM

## 2017-04-06 DIAGNOSIS — E785 Hyperlipidemia, unspecified: Secondary | ICD-10-CM | POA: Diagnosis present

## 2017-04-06 DIAGNOSIS — R001 Bradycardia, unspecified: Secondary | ICD-10-CM | POA: Diagnosis present

## 2017-04-06 DIAGNOSIS — Z9079 Acquired absence of other genital organ(s): Secondary | ICD-10-CM | POA: Diagnosis not present

## 2017-04-06 DIAGNOSIS — Z79899 Other long term (current) drug therapy: Secondary | ICD-10-CM | POA: Diagnosis not present

## 2017-04-06 DIAGNOSIS — I129 Hypertensive chronic kidney disease with stage 1 through stage 4 chronic kidney disease, or unspecified chronic kidney disease: Secondary | ICD-10-CM | POA: Diagnosis present

## 2017-04-06 DIAGNOSIS — Z8249 Family history of ischemic heart disease and other diseases of the circulatory system: Secondary | ICD-10-CM | POA: Diagnosis not present

## 2017-04-06 DIAGNOSIS — I214 Non-ST elevation (NSTEMI) myocardial infarction: Secondary | ICD-10-CM | POA: Diagnosis present

## 2017-04-06 DIAGNOSIS — Z9049 Acquired absence of other specified parts of digestive tract: Secondary | ICD-10-CM | POA: Diagnosis not present

## 2017-04-06 DIAGNOSIS — I200 Unstable angina: Secondary | ICD-10-CM | POA: Diagnosis not present

## 2017-04-06 DIAGNOSIS — Z8589 Personal history of malignant neoplasm of other organs and systems: Secondary | ICD-10-CM | POA: Diagnosis not present

## 2017-04-06 DIAGNOSIS — Z6841 Body Mass Index (BMI) 40.0 and over, adult: Secondary | ICD-10-CM | POA: Diagnosis not present

## 2017-04-06 DIAGNOSIS — Z90710 Acquired absence of both cervix and uterus: Secondary | ICD-10-CM | POA: Diagnosis not present

## 2017-04-06 DIAGNOSIS — K219 Gastro-esophageal reflux disease without esophagitis: Secondary | ICD-10-CM | POA: Diagnosis present

## 2017-04-06 DIAGNOSIS — Z90722 Acquired absence of ovaries, bilateral: Secondary | ICD-10-CM | POA: Diagnosis not present

## 2017-04-06 DIAGNOSIS — N183 Chronic kidney disease, stage 3 (moderate): Secondary | ICD-10-CM | POA: Diagnosis not present

## 2017-04-06 HISTORY — PX: CORONARY STENT INTERVENTION: CATH118234

## 2017-04-06 HISTORY — PX: LEFT HEART CATH AND CORONARY ANGIOGRAPHY: CATH118249

## 2017-04-06 LAB — COMPREHENSIVE METABOLIC PANEL
ALT: 13 U/L — ABNORMAL LOW (ref 14–54)
AST: 15 U/L (ref 15–41)
Albumin: 3.2 g/dL — ABNORMAL LOW (ref 3.5–5.0)
Alkaline Phosphatase: 105 U/L (ref 38–126)
Anion gap: 10 (ref 5–15)
BUN: 30 mg/dL — ABNORMAL HIGH (ref 6–20)
CO2: 23 mmol/L (ref 22–32)
Calcium: 8.8 mg/dL — ABNORMAL LOW (ref 8.9–10.3)
Chloride: 104 mmol/L (ref 101–111)
Creatinine, Ser: 1.43 mg/dL — ABNORMAL HIGH (ref 0.44–1.00)
GFR calc Af Amer: 38 mL/min — ABNORMAL LOW (ref >60)
GFR calc non Af Amer: 33 mL/min — ABNORMAL LOW (ref >60)
Glucose, Bld: 133 mg/dL — ABNORMAL HIGH (ref 65–99)
Potassium: 3.9 mmol/L (ref 3.5–5.1)
Sodium: 137 mmol/L (ref 135–145)
Total Bilirubin: 0.7 mg/dL (ref 0.3–1.2)
Total Protein: 6.1 g/dL — ABNORMAL LOW (ref 6.5–8.1)

## 2017-04-06 LAB — POCT I-STAT 3, ART BLOOD GAS (G3+)
Acid-base deficit: 11 mmol/L — ABNORMAL HIGH (ref 0.0–2.0)
Bicarbonate: 15.3 mmol/L — ABNORMAL LOW (ref 20.0–28.0)
O2 Saturation: 99 %
TCO2: 16 mmol/L — ABNORMAL LOW (ref 22–32)
pCO2 arterial: 34.8 mmHg (ref 32.0–48.0)
pH, Arterial: 7.249 — ABNORMAL LOW (ref 7.350–7.450)
pO2, Arterial: 147 mmHg — ABNORMAL HIGH (ref 83.0–108.0)

## 2017-04-06 LAB — CBC
HCT: 36.3 % (ref 36.0–46.0)
Hemoglobin: 11.1 g/dL — ABNORMAL LOW (ref 12.0–15.0)
MCH: 26.4 pg (ref 26.0–34.0)
MCHC: 30.6 g/dL (ref 30.0–36.0)
MCV: 86.2 fL (ref 78.0–100.0)
Platelets: 184 10*3/uL (ref 150–400)
RBC: 4.21 MIL/uL (ref 3.87–5.11)
RDW: 18.5 % — ABNORMAL HIGH (ref 11.5–15.5)
WBC: 11.6 10*3/uL — ABNORMAL HIGH (ref 4.0–10.5)

## 2017-04-06 LAB — BASIC METABOLIC PANEL
Anion gap: 12 (ref 5–15)
BUN: 24 mg/dL — ABNORMAL HIGH (ref 6–20)
CO2: 17 mmol/L — ABNORMAL LOW (ref 22–32)
Calcium: 8.6 mg/dL — ABNORMAL LOW (ref 8.9–10.3)
Chloride: 108 mmol/L (ref 101–111)
Creatinine, Ser: 1.26 mg/dL — ABNORMAL HIGH (ref 0.44–1.00)
GFR calc Af Amer: 45 mL/min — ABNORMAL LOW (ref >60)
GFR calc non Af Amer: 39 mL/min — ABNORMAL LOW (ref >60)
Glucose, Bld: 152 mg/dL — ABNORMAL HIGH (ref 65–99)
Potassium: 4.4 mmol/L (ref 3.5–5.1)
Sodium: 137 mmol/L (ref 135–145)

## 2017-04-06 LAB — POCT ACTIVATED CLOTTING TIME
Activated Clotting Time: 224 seconds
Activated Clotting Time: 439 seconds

## 2017-04-06 LAB — CK TOTAL AND CKMB (NOT AT ARMC)
CK, MB: 3.7 ng/mL (ref 0.5–5.0)
Relative Index: 3.1 — ABNORMAL HIGH (ref 0.0–2.5)
Total CK: 118 U/L (ref 38–234)

## 2017-04-06 LAB — POCT I-STAT, CHEM 8
BUN: 24 mg/dL — ABNORMAL HIGH (ref 6–20)
Calcium, Ion: 1.05 mmol/L — ABNORMAL LOW (ref 1.15–1.40)
Chloride: 82 mmol/L — ABNORMAL LOW (ref 101–111)
Creatinine, Ser: 0.6 mg/dL (ref 0.44–1.00)
Glucose, Bld: 74 mg/dL (ref 65–99)
HCT: 32 % — ABNORMAL LOW (ref 36.0–46.0)
Hemoglobin: 10.9 g/dL — ABNORMAL LOW (ref 12.0–15.0)
Potassium: 3.1 mmol/L — ABNORMAL LOW (ref 3.5–5.1)
Sodium: 119 mmol/L — CL (ref 135–145)
TCO2: 18 mmol/L — ABNORMAL LOW (ref 22–32)

## 2017-04-06 LAB — PROTIME-INR
INR: 1.04
Prothrombin Time: 13.5 seconds (ref 11.4–15.2)

## 2017-04-06 LAB — LIPID PANEL
Cholesterol: 222 mg/dL — ABNORMAL HIGH (ref 0–200)
HDL: 49 mg/dL (ref >40)
LDL Cholesterol: 140 mg/dL — ABNORMAL HIGH (ref 0–99)
Total CHOL/HDL Ratio: 4.5 RATIO
Triglycerides: 165 mg/dL — ABNORMAL HIGH (ref <150)
VLDL: 33 mg/dL (ref 0–40)

## 2017-04-06 LAB — TROPONIN I: Troponin I: 0.11 ng/mL (ref <0.03)

## 2017-04-06 SURGERY — LEFT HEART CATH AND CORONARY ANGIOGRAPHY
Anesthesia: LOCAL

## 2017-04-06 MED ORDER — HYDRALAZINE HCL 20 MG/ML IJ SOLN
INTRAMUSCULAR | Status: DC | PRN
  Administered 2017-04-06: 20 mg via INTRAVENOUS

## 2017-04-06 MED ORDER — MIDAZOLAM HCL 2 MG/2ML IJ SOLN
INTRAMUSCULAR | Status: AC
  Filled 2017-04-06: qty 2

## 2017-04-06 MED ORDER — VERAPAMIL HCL 2.5 MG/ML IV SOLN
INTRAVENOUS | Status: DC | PRN
  Administered 2017-04-06: 10 mL via INTRA_ARTERIAL

## 2017-04-06 MED ORDER — ACETAMINOPHEN 325 MG PO TABS
650.0000 mg | ORAL_TABLET | ORAL | Status: DC | PRN
  Administered 2017-04-07 – 2017-04-08 (×4): 650 mg via ORAL
  Filled 2017-04-06 (×4): qty 2

## 2017-04-06 MED ORDER — HEPARIN SODIUM (PORCINE) 1000 UNIT/ML IJ SOLN
INTRAMUSCULAR | Status: AC
  Filled 2017-04-06: qty 1

## 2017-04-06 MED ORDER — FENTANYL CITRATE (PF) 100 MCG/2ML IJ SOLN
INTRAMUSCULAR | Status: AC
  Filled 2017-04-06: qty 2

## 2017-04-06 MED ORDER — FENTANYL CITRATE (PF) 100 MCG/2ML IJ SOLN
INTRAMUSCULAR | Status: DC | PRN
  Administered 2017-04-06: 25 ug via INTRAVENOUS

## 2017-04-06 MED ORDER — HYDRALAZINE HCL 20 MG/ML IJ SOLN
INTRAMUSCULAR | Status: AC
  Filled 2017-04-06: qty 1

## 2017-04-06 MED ORDER — LIDOCAINE HCL (PF) 1 % IJ SOLN
INTRAMUSCULAR | Status: AC
  Filled 2017-04-06: qty 30

## 2017-04-06 MED ORDER — HEPARIN (PORCINE) IN NACL 2-0.9 UNIT/ML-% IJ SOLN
INTRAMUSCULAR | Status: AC | PRN
  Administered 2017-04-06: 1000 mL

## 2017-04-06 MED ORDER — HEPARIN (PORCINE) IN NACL 2-0.9 UNIT/ML-% IJ SOLN
INTRAMUSCULAR | Status: AC
  Filled 2017-04-06: qty 1000

## 2017-04-06 MED ORDER — AMLODIPINE BESYLATE 5 MG PO TABS
5.0000 mg | ORAL_TABLET | Freq: Every day | ORAL | Status: DC
  Administered 2017-04-06 – 2017-04-07 (×2): 5 mg via ORAL
  Filled 2017-04-06 (×2): qty 1

## 2017-04-06 MED ORDER — CLOPIDOGREL BISULFATE 300 MG PO TABS
ORAL_TABLET | ORAL | Status: AC
  Filled 2017-04-06: qty 2

## 2017-04-06 MED ORDER — HEPARIN SODIUM (PORCINE) 1000 UNIT/ML IJ SOLN
INTRAMUSCULAR | Status: DC | PRN
  Administered 2017-04-06: 4000 [IU] via INTRAVENOUS

## 2017-04-06 MED ORDER — ONDANSETRON HCL 4 MG/2ML IJ SOLN
INTRAMUSCULAR | Status: DC | PRN
  Administered 2017-04-06: 4 mg via INTRAVENOUS

## 2017-04-06 MED ORDER — IOPAMIDOL (ISOVUE-370) INJECTION 76%
INTRAVENOUS | Status: DC | PRN
  Administered 2017-04-06: 90 mL via INTRA_ARTERIAL

## 2017-04-06 MED ORDER — CLOPIDOGREL BISULFATE 300 MG PO TABS
ORAL_TABLET | ORAL | Status: DC | PRN
  Administered 2017-04-06: 600 mg via ORAL

## 2017-04-06 MED ORDER — LABETALOL HCL 5 MG/ML IV SOLN
10.0000 mg | INTRAVENOUS | Status: DC | PRN
  Administered 2017-04-06 – 2017-04-08 (×3): 10 mg via INTRAVENOUS
  Filled 2017-04-06 (×2): qty 4

## 2017-04-06 MED ORDER — SODIUM CHLORIDE 0.9 % IV SOLN: INTRAVENOUS | Status: AC

## 2017-04-06 MED ORDER — ONDANSETRON HCL 4 MG/2ML IJ SOLN: 4.0000 mg | Freq: Four times a day (QID) | INTRAMUSCULAR | Status: DC | PRN

## 2017-04-06 MED ORDER — NITROGLYCERIN 0.4 MG SL SUBL: 0.4000 mg | SUBLINGUAL_TABLET | SUBLINGUAL | Status: DC | PRN

## 2017-04-06 MED ORDER — IOPAMIDOL (ISOVUE-370) INJECTION 76%
INTRAVENOUS | Status: AC
  Filled 2017-04-06: qty 50

## 2017-04-06 MED ORDER — SODIUM CHLORIDE 0.9% FLUSH: 3.0000 mL | INTRAVENOUS | Status: DC | PRN

## 2017-04-06 MED ORDER — VERAPAMIL HCL 2.5 MG/ML IV SOLN
INTRAVENOUS | Status: AC
  Filled 2017-04-06: qty 2

## 2017-04-06 MED ORDER — MIDAZOLAM HCL 2 MG/2ML IJ SOLN
INTRAMUSCULAR | Status: DC | PRN
  Administered 2017-04-06: 1 mg via INTRAVENOUS

## 2017-04-06 MED ORDER — LIDOCAINE HCL (PF) 1 % IJ SOLN
INTRAMUSCULAR | Status: DC | PRN
  Administered 2017-04-06: 2 mL via SUBCUTANEOUS
  Administered 2017-04-06: 3 mg via SUBCUTANEOUS
  Administered 2017-04-06: 4 mL via SUBCUTANEOUS

## 2017-04-06 MED ORDER — CLOPIDOGREL BISULFATE 75 MG PO TABS
75.0000 mg | ORAL_TABLET | Freq: Every day | ORAL | Status: DC
  Administered 2017-04-07 – 2017-04-09 (×3): 75 mg via ORAL
  Filled 2017-04-06 (×3): qty 1

## 2017-04-06 MED ORDER — ONDANSETRON HCL 4 MG/2ML IJ SOLN
INTRAMUSCULAR | Status: AC
  Filled 2017-04-06: qty 2

## 2017-04-06 MED ORDER — NITROGLYCERIN IN D5W 200-5 MCG/ML-% IV SOLN
0.0000 ug/min | INTRAVENOUS | Status: DC
  Administered 2017-04-06: 5 ug/min via INTRAVENOUS
  Filled 2017-04-06: qty 250

## 2017-04-06 MED ORDER — ENOXAPARIN SODIUM 40 MG/0.4ML ~~LOC~~ SOLN: 40.0000 mg | SUBCUTANEOUS | Status: DC

## 2017-04-06 MED ORDER — MIDAZOLAM HCL 2 MG/2ML IJ SOLN
INTRAMUSCULAR | Status: DC | PRN
  Administered 2017-04-06 (×2): 1 mg via INTRAVENOUS

## 2017-04-06 MED ORDER — HEPARIN SODIUM (PORCINE) 1000 UNIT/ML IJ SOLN
INTRAMUSCULAR | Status: DC | PRN
  Administered 2017-04-06: 5000 [IU] via INTRAVENOUS
  Administered 2017-04-06: 2000 [IU] via INTRAVENOUS

## 2017-04-06 MED ORDER — IOPAMIDOL (ISOVUE-370) INJECTION 76%
INTRAVENOUS | Status: AC
  Filled 2017-04-06: qty 100

## 2017-04-06 MED ORDER — ENOXAPARIN SODIUM 40 MG/0.4ML ~~LOC~~ SOLN
40.0000 mg | Freq: Every day | SUBCUTANEOUS | Status: DC
  Administered 2017-04-07 – 2017-04-08 (×2): 40 mg via SUBCUTANEOUS
  Filled 2017-04-06 (×2): qty 0.4

## 2017-04-06 MED ORDER — NITROGLYCERIN 1 MG/10 ML FOR IR/CATH LAB
INTRA_ARTERIAL | Status: AC
  Filled 2017-04-06: qty 10

## 2017-04-06 MED ORDER — IOPAMIDOL (ISOVUE-370) INJECTION 76%
INTRAVENOUS | Status: DC | PRN
  Administered 2017-04-06: 75 mL via INTRA_ARTERIAL

## 2017-04-06 MED ORDER — SODIUM CHLORIDE 0.9% FLUSH
3.0000 mL | Freq: Two times a day (BID) | INTRAVENOUS | Status: DC
  Administered 2017-04-07 – 2017-04-08 (×3): 3 mL via INTRAVENOUS

## 2017-04-06 MED ORDER — SODIUM CHLORIDE 0.9 % IV SOLN: 250.0000 mL | INTRAVENOUS | Status: DC | PRN

## 2017-04-06 MED ORDER — CARVEDILOL 6.25 MG PO TABS: 6.2500 mg | ORAL_TABLET | Freq: Two times a day (BID) | ORAL | Status: DC

## 2017-04-06 SURGICAL SUPPLY — 22 items
BALLN EMERGE MR 2.5X12 (BALLOONS) ×2
BALLOON EMERGE MR 2.5X12 (BALLOONS) IMPLANT
CATH 5FR JL3.5 JR4 ANG PIG MP (CATHETERS) ×1 IMPLANT
CATH INFINITI 4FR 3 DRC (CATHETERS) ×1 IMPLANT
CATH INFINITI 4FR JL3.5 (CATHETERS) ×1 IMPLANT
CATH VISTA GUIDE 6FR XB3 (CATHETERS) ×1 IMPLANT
DEVICE RAD COMP TR BAND LRG (VASCULAR PRODUCTS) ×1 IMPLANT
ELECT DEFIB PAD ADLT CADENCE (PAD) ×1 IMPLANT
GLIDESHEATH SLEND SS 6F .021 (SHEATH) ×3 IMPLANT
GUIDEWIRE INQWIRE 1.5J.035X260 (WIRE) IMPLANT
HOVERMATT SINGLE USE (MISCELLANEOUS) ×1 IMPLANT
INQWIRE 1.5J .035X260CM (WIRE) ×4
KIT ENCORE 26 ADVANTAGE (KITS) ×1 IMPLANT
KIT HEART LEFT (KITS) ×2 IMPLANT
PACK CARDIAC CATHETERIZATION (CUSTOM PROCEDURE TRAY) ×2 IMPLANT
SHEATH PINNACLE 4F 10CM (SHEATH) ×1 IMPLANT
STENT SVELTE  RX 3.50 X 28MM (Permanent Stent) ×1 IMPLANT
TRANSDUCER W/STOPCOCK (MISCELLANEOUS) ×2 IMPLANT
TUBING CIL FLEX 10 FLL-RA (TUBING) ×2 IMPLANT
WIRE HI TORQ VERSACORE-J 145CM (WIRE) ×1 IMPLANT
WIRE MICROINTRODUCER 60CM (WIRE) ×1 IMPLANT
WIRE RUNTHROUGH .014X180CM (WIRE) ×1 IMPLANT

## 2017-04-06 NOTE — Care Management Obs Status (Addendum)
[facility] NOTIFICATION   Patient Details  Name: Valerie Ramirez MRN: [id number] Date of Birth: 1932-12-17   Medicare Observation Status Notification Given:  Yes    Erenest Rasher, RN 04/06/2017, 4:06 PM

## 2017-04-06 NOTE — H&P (View-Only) (Signed)
Progress Note  Patient Name: Valerie Ramirez Date of Encounter: 04/06/2017  Primary Cardiologist: Valerie Denherder Martinique, MD   Subjective   Mild chest pressure since last night.   Inpatient Medications    Scheduled Meds: . aspirin EC  81 mg Oral Daily  . doxazosin  8 mg Oral QHS  . DULoxetine  20 mg Oral Daily  . enoxaparin (LOVENOX) injection  40 mg Subcutaneous Q24H  . rosuvastatin  10 mg Oral q1800  . sodium chloride flush  3 mL Intravenous Q12H   Continuous Infusions: . sodium chloride    . sodium chloride 1 mL/kg/hr (04/06/17 2458)   PRN Meds: sodium chloride, acetaminophen, cyclobenzaprine, gi cocktail, hydrALAZINE, morphine injection, ondansetron (ZOFRAN) IV, sodium chloride flush   Vital Signs    Vitals:   04/05/17 2029 04/05/17 2205 04/05/17 2300 04/06/17 0536  BP: (!) 195/76 (!) 185/75 (!) 164/78 (!) 180/64  Pulse:    75  Resp:      Temp: 98.1 F (36.7 C)   98.1 F (36.7 C)  TempSrc: Oral   Oral  SpO2: 98%   95%  Weight:    232 lb 4.8 oz (105.4 kg)  Height:        Intake/Output Summary (Last 24 hours) at 04/06/2017 0998 Last data filed at 04/06/2017 0102 Gross per 24 hour  Intake -  Output 300 ml  Net -300 ml   Filed Weights   04/04/17 1812 04/05/17 1824 04/06/17 0536  Weight: 224 lb (101.6 kg) 230 lb (104.3 kg) 232 lb 4.8 oz (105.4 kg)    Telemetry  Unable to review as is not working today - trying to resolve issue  ECG    None today   Physical Exam   GEN: No acute distress.   Neck: No JVD Cardiac: RRR, no murmurs, rubs, or gallops.  Respiratory: Clear to auscultation bilaterally. GI: Soft, nontender, non-distended  MS: No edema; No deformity. Neuro:  Nonfocal  Psych: Normal affect   Labs    Chemistry Recent Labs  Lab 04/04/17 1817 04/06/17 0301  NA 138 137  K 4.2 3.9  CL 105 104  CO2 24 23  GLUCOSE 116* 133*  BUN 32* 30*  CREATININE 1.45* 1.43*  CALCIUM 9.0 8.8*  PROT  --  6.1*  ALBUMIN  --  3.2*  AST  --  15  ALT  --   13*  ALKPHOS  --  105  BILITOT  --  0.7  GFRNONAA 33* 33*  GFRAA 38* 38*  ANIONGAP 9 10     Hematology Recent Labs  Lab 04/04/17 1817  WBC 9.6  RBC 4.22  HGB 11.2*  HCT 35.9*  MCV 85.1  MCH 26.5  MCHC 31.2  RDW 17.4*  PLT 206    Cardiac Enzymes Recent Labs  Lab 04/05/17 0334 04/05/17 0555 04/05/17 0843  TROPONINI <0.03 <0.03 <0.03    Recent Labs  Lab 04/04/17 1859 04/05/17 0100  TROPIPOC 0.03 0.02     BNPNo results for input(s): BNP, PROBNP in the last 168 hours.   DDimer No results for input(s): DDIMER in the last 168 hours.   Radiology    Dg Chest 2 View  Result Date: 04/04/2017 CLINICAL DATA:  Chest pain EXAM: CHEST  2 VIEW COMPARISON:  08/08/2015 FINDINGS: Heart size mildly enlarged. Atherosclerotic aorta. Negative for heart failure. Lungs are clear without infiltrate or effusion. IMPRESSION: No active cardiopulmonary disease. Electronically Signed   By: Franchot Gallo M.D.   On: 04/04/2017 19:20  Cardiac Studies   Echo  Study Conclusions  - Left ventricle: The cavity size was normal. Wall thickness was   increased in a pattern of severe LVH. Systolic function was   normal. The estimated ejection fraction was in the range of 55%   to 60%. Wall motion was normal; there were no regional wall   motion abnormalities. Doppler parameters are consistent with   pseudonormal left ventricular relaxation (grade 2 diastolic   dysfunction). The E/e&' ratio is >15, suggesting elevated LV   filling pressure. - Aortic valve: Sclerosis without stenosis. Transvalvular velocity   was minimally increased. Mean gradient (S): 7 mm Hg. - Mitral valve: Mildly thickened leaflets . There was trivial   regurgitation. - Left atrium: Moderately dilated. - Tricuspid valve: There was mild regurgitation. - Pulmonary arteries: PA peak pressure: 34 mm Hg (S). - Inferior vena cava: The vessel was normal in size. The   respirophasic diameter changes were in the normal range  (>= 50%),   consistent with normal central venous pressure.  Impressions:  - LVEF 55-60%, severe LVH, normal wall motion, grade 2 DD with   elevated LV filling pressure, aortic valve sclerosis, trivial MR,   moderate LAE, mild TR, RVSP 34 mmHg, normal IVC.  Patient Profile     83 y.o. female with a hx of  With hx of HTN, HLD, hypothyroidism and secondary exposure to tobacco smoking who is being seen for  6 months hx of DOE and chest pressure. She takes multiple doses of ASA and Tylenol for chronic back and knee pain. She uses at least 1 bottle of 50 tables of 500mg  ASA/week.   Assessment & Plan    1. Accelerating angina - Ongoing for at least 6 months. EKG with more prominent ST depression inferior laterally. Enzyme remained negative. Echo showed normal LVEF with grade 2 DD.  - She has mild 3-4/10 chest pressure since last night. Start IV nitro. Check troponin and EKG.  - Continue ASA 81mg  and statin. Cath later today.   2. HLD - 04/06/2017: Cholesterol 222; HDL 49; LDL Cholesterol 140; Triglycerides 165; VLDL 33  - Allergic to Lipitor and Simvastatin. Started low dose Crestor. Outpatient lipid clinic referral.   3. Sinus bradycardia - Held home BB.  Unable review telemetry this morning as it non functional currently.  - Change to coreg later this admission.   4. Hypertensive urgency - Exacerbated by NSAIDS use. Start IV nitro as above.   4. Acute on CKD stage III - Baseline Scr around 1.2-1.3. On hydration.    5. Hypothyroidism - Per primary team    For questions or updates, please contact [place] Please consult [email or web address] for contact info under Cardiology/STEMI.      Valerie Longest [place], PA  04/06/2017, 9:09 AM    Patient seen and examined and history reviewed. Agree with above findings and plan. Patient states she had a ton of bricks on her chest last night. Better now. On IV Ntg. BP still elevated but improved. Renal indices stable.  Bradycardia resolved. Will start Coreg. Plan to proceed with cardiac cath today.  Valerie Ramirez, [place] 04/06/2017 11:36 AM

## 2017-04-06 NOTE — Progress Notes (Signed)
PROGRESS NOTE    Valerie Ramirez  CBJ:[id number] DOB: 04-03-1933 DOA: 04/05/2017 PCP: Abner Greenspan, MD   Outpatient Specialists:     Brief Narrative:  Valerie Ramirez is a 83 y.o. female with medical history significant of HTN, osteoarthritis, gout, and GERD; who presents with complaints of intermittent chest pain with exertion over the last 6 months.  Patient was observed stopping multiple times while walking back into the house from being out in the yard by her daughter today.  She complained of substernal chest pressure as though a ton of bricks were sitting on her chest.  She had associated symptoms of nausea, vomiting, headache, and intermittent ankle swelling.  Symptoms normally go away with rest.  Plan for cath 12/19.       Assessment & Plan:   Principal Problem:   Chest pain Active Problems:   Hypertensive urgency   CKD (chronic kidney disease), stage III (HCC)   Normocytic normochromic anemia   Chest pain with exertion -CE negative - echocardiogram: - LVEF 55-60%, severe LVH, normal wall motion, grade 2 DD with elevated LV filling pressure, aortic valve sclerosis, trivial MR,  moderate LAE, mild TR, RVSP 34 mmHg, normal IVC. - TSH: 4.855 with normal t4 -cardiology following -cath 12/19  Hypertensive urgency: Acute.  -initial blood pressure was elevated up to 233/89 -on nitro gtt  Chronic kidney disease stage III:  -Cr stable  Normocytic normochromic anemia - from CKD?  History of hypothyroidism: Patient currently not on any thyroid medications.  - TSH slight high but free t4 normal-- outpatient follow up     DVT prophylaxis:  Lovenox   Code Status: Full Code   Family Communication:   Disposition Plan:     Consultants:  cards  Subjective: Had episode of CP last night but says she is feeling better and ready to go home   Objective: Vitals:   04/05/17 2205 04/05/17 2300 04/06/17 0536 04/06/17 0920  BP: (!) 185/75 (!) 164/78 (!)  180/64 (!) 166/67  Pulse:   75   Resp:      Temp:   98.1 F (36.7 C)   TempSrc:   Oral   SpO2:   95%   Weight:   105.4 kg (232 lb 4.8 oz)   Height:        Intake/Output Summary (Last 24 hours) at 04/06/2017 1356 Last data filed at 04/06/2017 0941 Gross per 24 hour  Intake 0 ml  Output 300 ml  Net -300 ml   Filed Weights   04/04/17 1812 04/05/17 1824 04/06/17 0536  Weight: 101.6 kg (224 lb) 104.3 kg (230 lb) 105.4 kg (232 lb 4.8 oz)    Examination:  General exam: Appears calm and comfortable  Cardiovascular system: RRR Central nervous system: Alert and oriented. No focal neurological deficits. Skin: No rashes, lesions or ulcers Psychiatry: Judgement and insight appear normal. Mood & affect appropriate.     Data Reviewed: I have personally reviewed following labs and imaging studies  CBC: Recent Labs  Lab 04/04/17 1817  WBC 9.6  HGB 11.2*  HCT 35.9*  MCV 85.1  PLT 160   Basic Metabolic Panel: Recent Labs  Lab 04/04/17 1817 04/06/17 0301  NA 138 137  K 4.2 3.9  CL 105 104  CO2 24 23  GLUCOSE 116* 133*  BUN 32* 30*  CREATININE 1.45* 1.43*  CALCIUM 9.0 8.8*   GFR: Estimated Creatinine Clearance: 34.6 mL/min (A) (by C-G formula based on SCr of 1.43 mg/dL (H)). Liver  Function Tests: Recent Labs  Lab 04/06/17 0301  AST 15  ALT 13*  ALKPHOS 105  BILITOT 0.7  PROT 6.1*  ALBUMIN 3.2*   No results for input(s): LIPASE, AMYLASE in the last 168 hours. No results for input(s): AMMONIA in the last 168 hours. Coagulation Profile: Recent Labs  Lab 04/06/17 0301  INR 1.04   Cardiac Enzymes: Recent Labs  Lab 04/05/17 0334 04/05/17 0555 04/05/17 0843  TROPONINI <0.03 <0.03 <0.03   BNP (last 3 results) No results for input(s): PROBNP in the last 8760 hours. HbA1C: No results for input(s): HGBA1C in the last 72 hours. CBG: No results for input(s): GLUCAP in the last 168 hours. Lipid Profile: Recent Labs    04/06/17 0301  CHOL 222*  HDL 49    LDLCALC 140*  TRIG 165*  CHOLHDL 4.5   Thyroid Function Tests: Recent Labs    04/05/17 0334 04/05/17 0843  TSH 4.855*  --   FREET4  --  0.92   Anemia Panel: No results for input(s): VITAMINB12, FOLATE, FERRITIN, TIBC, IRON, RETICCTPCT in the last 72 hours. Urine analysis:    Component Value Date/Time   COLORURINE AMBER (A) 04/25/2016 1704   APPEARANCEUR HAZY (A) 04/25/2016 1704   LABSPEC 1.025 04/25/2016 1704   PHURINE 5.0 04/25/2016 1704   GLUCOSEU NEGATIVE 04/25/2016 1704   HGBUR NEGATIVE 04/25/2016 1704   BILIRUBINUR Negative 09/15/2016 1214   KETONESUR NEGATIVE 04/25/2016 1704   PROTEINUR 15 mg/dL 09/15/2016 1214   PROTEINUR 100 (A) 04/25/2016 1704   UROBILINOGEN 0.2 09/15/2016 1214   UROBILINOGEN 1.0 06/20/2014 1030   NITRITE Negative 09/15/2016 1214   NITRITE NEGATIVE 04/25/2016 1704   LEUKOCYTESUR Negative 09/15/2016 1214    )No results found for this or any previous visit (from the past 240 hour(s)).    Anti-infectives (From admission, onward)   None       Radiology Studies: Dg Chest 2 View  Result Date: 04/04/2017 CLINICAL DATA:  Chest pain EXAM: CHEST  2 VIEW COMPARISON:  08/08/2015 FINDINGS: Heart size mildly enlarged. Atherosclerotic aorta. Negative for heart failure. Lungs are clear without infiltrate or effusion. IMPRESSION: No active cardiopulmonary disease. Electronically Signed   By: Franchot Gallo M.D.   On: 04/04/2017 19:20        Scheduled Meds: . aspirin EC  81 mg Oral Daily  . carvedilol  6.25 mg Oral BID WC  . doxazosin  8 mg Oral QHS  . DULoxetine  20 mg Oral Daily  . enoxaparin (LOVENOX) injection  40 mg Subcutaneous Q24H  . rosuvastatin  10 mg Oral q1800  . sodium chloride flush  3 mL Intravenous Q12H   Continuous Infusions: . sodium chloride    . sodium chloride 1 mL/kg/hr (04/06/17 7616)  . nitroGLYCERIN 5 mcg/min (04/06/17 0941)     LOS: 0 days    Time spent: 35 min    Geradine Girt, DO [facility] Pager [phone]  If 7PM-7AM, please contact night-coverage [email or web address] Password [email or web address] 04/06/2017, 1:56 PM

## 2017-04-06 NOTE — Research (Signed)
OPTIMIZE Research Study Informed Consent   Subject Name: Valerie Ramirez  Subject met inclusion and exclusion criteria.  The informed consent form, study requirements and expectations were reviewed with the subject and questions and concerns were addressed prior to the signing of the consent form.  The subject verbalized understanding of the trial requirements.  The subject agreed to participate in the OPTIMIZE trial and signed the informed consent at 1338 on 04/06/2017.  The informed consent was obtained prior to performance of any protocol-specific procedures for the subject.  A copy of the signed informed consent was given to the subject and a copy was placed in the subject's medical record. The subject will be enrolled if angiographic criteria is met.  Blossom Hoops 04/06/2017, 3:12 PM

## 2017-04-06 NOTE — Care Management Note (Signed)
Case Management Note  Patient Details  Name: Valerie Ramirez MRN: [id number] Date of Birth: Jul 31, 1932  Subjective/Objective:   Chest pain, lives at home alone. She was independent prior to hospital stay . No DME needed. Scheduled cardiac cath 04/06/2017. Has good family support.            Action/Plan: Discharge Planning: NCM will continue to follow for dc needs.   Expected Discharge Date:                 Expected Discharge Plan:  Home/Self Care  In-House Referral:  NA  Discharge planning Services  CM Consult  Post Acute Care Choice:  NA Choice offered to:  NA  DME Arranged:  [name] DME Agency:  NA  HH Arranged:  NA HH Agency:  NA  Status of Service:  In process, will continue to follow  If discussed at Long Length of Stay Meetings, dates discussed:    Additional Comments:  Erenest Rasher, RN 04/06/2017, 4:09 PM

## 2017-04-06 NOTE — Progress Notes (Signed)
Progress Note  Patient Name: Valerie Ramirez Date of Encounter: 04/06/2017  Primary Cardiologist: Dontarious Schaum [place], MD   Subjective   Mild chest pressure since last night.   Inpatient Medications    Scheduled Meds: . aspirin EC  81 mg Oral Daily  . doxazosin  8 mg Oral QHS  . DULoxetine  20 mg Oral Daily  . enoxaparin (LOVENOX) injection  40 mg Subcutaneous Q24H  . rosuvastatin  10 mg Oral q1800  . sodium chloride flush  3 mL Intravenous Q12H   Continuous Infusions: . sodium chloride    . sodium chloride 1 mL/kg/hr (04/06/17 5277)   PRN Meds: sodium chloride, acetaminophen, cyclobenzaprine, gi cocktail, hydrALAZINE, morphine injection, ondansetron (ZOFRAN) IV, sodium chloride flush   Vital Signs    Vitals:   04/05/17 2029 04/05/17 2205 04/05/17 2300 04/06/17 0536  BP: (!) 195/76 (!) 185/75 (!) 164/78 (!) 180/64  Pulse:    75  Resp:      Temp: 98.1 F (36.7 C)   98.1 F (36.7 C)  TempSrc: Oral   Oral  SpO2: 98%   95%  Weight:    232 lb 4.8 oz (105.4 kg)  Height:        Intake/Output Summary (Last 24 hours) at 04/06/2017 8242 Last data filed at 04/06/2017 0102 Gross per 24 hour  Intake -  Output 300 ml  Net -300 ml   Filed Weights   04/04/17 1812 04/05/17 1824 04/06/17 0536  Weight: 224 lb (101.6 kg) 230 lb (104.3 kg) 232 lb 4.8 oz (105.4 kg)    Telemetry  Unable to review as is not working today - trying to resolve issue  ECG    None today   Physical Exam   GEN: No acute distress.   Neck: No JVD Cardiac: RRR, no murmurs, rubs, or gallops.  Respiratory: Clear to auscultation bilaterally. GI: Soft, nontender, non-distended  MS: No edema; No deformity. Neuro:  Nonfocal  Psych: Normal affect   Labs    Chemistry Recent Labs  Lab 04/04/17 1817 04/06/17 0301  NA 138 137  K 4.2 3.9  CL 105 104  CO2 24 23  GLUCOSE 116* 133*  BUN 32* 30*  CREATININE 1.45* 1.43*  CALCIUM 9.0 8.8*  PROT  --  6.1*  ALBUMIN  --  3.2*  AST  --  15  ALT  --   13*  ALKPHOS  --  105  BILITOT  --  0.7  GFRNONAA 33* 33*  GFRAA 38* 38*  ANIONGAP 9 10     Hematology Recent Labs  Lab 04/04/17 1817  WBC 9.6  RBC 4.22  HGB 11.2*  HCT 35.9*  MCV 85.1  MCH 26.5  MCHC 31.2  RDW 17.4*  PLT 206    Cardiac Enzymes Recent Labs  Lab 04/05/17 0334 04/05/17 0555 04/05/17 0843  TROPONINI <0.03 <0.03 <0.03    Recent Labs  Lab 04/04/17 1859 04/05/17 0100  TROPIPOC 0.03 0.02     BNPNo results for input(s): BNP, PROBNP in the last 168 hours.   DDimer No results for input(s): DDIMER in the last 168 hours.   Radiology    Dg Chest 2 View  Result Date: 04/04/2017 CLINICAL DATA:  Chest pain EXAM: CHEST  2 VIEW COMPARISON:  08/08/2015 FINDINGS: Heart size mildly enlarged. Atherosclerotic aorta. Negative for heart failure. Lungs are clear without infiltrate or effusion. IMPRESSION: No active cardiopulmonary disease. Electronically Signed   By: Franchot Gallo M.D.   On: 04/04/2017 19:20  Cardiac Studies   Echo  Study Conclusions  - Left ventricle: The cavity size was normal. Wall thickness was   increased in a pattern of severe LVH. Systolic function was   normal. The estimated ejection fraction was in the range of 55%   to 60%. Wall motion was normal; there were no regional wall   motion abnormalities. Doppler parameters are consistent with   pseudonormal left ventricular relaxation (grade 2 diastolic   dysfunction). The E/e&' ratio is >15, suggesting elevated LV   filling pressure. - Aortic valve: Sclerosis without stenosis. Transvalvular velocity   was minimally increased. Mean gradient (S): 7 mm Hg. - Mitral valve: Mildly thickened leaflets . There was trivial   regurgitation. - Left atrium: Moderately dilated. - Tricuspid valve: There was mild regurgitation. - Pulmonary arteries: PA peak pressure: 34 mm Hg (S). - Inferior vena cava: The vessel was normal in size. The   respirophasic diameter changes were in the normal range  (>= 50%),   consistent with normal central venous pressure.  Impressions:  - LVEF 55-60%, severe LVH, normal wall motion, grade 2 DD with   elevated LV filling pressure, aortic valve sclerosis, trivial MR,   moderate LAE, mild TR, RVSP 34 mmHg, normal IVC.  Patient Profile     83 y.o. female with a hx of  With hx of HTN, HLD, hypothyroidism and secondary exposure to tobacco smoking who is being seen for  6 months hx of DOE and chest pressure. She takes multiple doses of ASA and Tylenol for chronic back and knee pain. She uses at least 1 bottle of 50 tables of 500mg  ASA/week.   Assessment & Plan    1. Accelerating angina - Ongoing for at least 6 months. EKG with more prominent ST depression inferior laterally. Enzyme remained negative. Echo showed normal LVEF with grade 2 DD.  - She has mild 3-4/10 chest pressure since last night. Start IV nitro. Check troponin and EKG.  - Continue ASA 81mg  and statin. Cath later today.   2. HLD - 04/06/2017: Cholesterol 222; HDL 49; LDL Cholesterol 140; Triglycerides 165; VLDL 33  - Allergic to Lipitor and Simvastatin. Started low dose Crestor. Outpatient lipid clinic referral.   3. Sinus bradycardia - Held home BB.  Unable review telemetry this morning as it non functional currently.  - Change to coreg later this admission.   4. Hypertensive urgency - Exacerbated by NSAIDS use. Start IV nitro as above.   4. Acute on CKD stage III - Baseline Scr around 1.2-1.3. On hydration.    5. Hypothyroidism - Per primary team    For questions or updates, please contact [place] Please consult [email or web address] for contact info under Cardiology/STEMI.      Mahalia Longest Alicia, PA  04/06/2017, 9:09 AM    Patient seen and examined and history reviewed. Agree with above findings and plan. Patient states she had a ton of bricks on her chest last night. Better now. On IV Ntg. BP still elevated but improved. Renal indices stable.  Bradycardia resolved. Will start Coreg. Plan to proceed with cardiac cath today.  Maximino Cozzolino [place], [place] 04/06/2017 11:36 AM

## 2017-04-06 NOTE — Interval H&P Note (Signed)
Cath Lab Visit (complete for each Cath Lab visit)  Clinical Evaluation Leading to the Procedure:   ACS: Yes.   unstable angina  Non-ACS:   n/a   History and Physical Interval Note:  04/06/2017 4:02 PM  Valerie Ramirez  has presented today for surgery, with the diagnosis of unstable angina  The various methods of treatment have been discussed with the patient and family. After consideration of risks, benefits and other options for treatment, the patient has consented to  Procedure(s): LEFT HEART CATH AND CORONARY ANGIOGRAPHY ([name]) as a surgical intervention .  The patient's history has been reviewed, patient examined, no change in status, stable for surgery.  I have reviewed the patient's chart and labs.  Questions were answered to the patient's satisfaction.     Kathlyn [place]

## 2017-04-07 ENCOUNTER — Encounter (HOSPITAL_COMMUNITY): Payer: Self-pay | Admitting: Internal Medicine

## 2017-04-07 DIAGNOSIS — N183 Chronic kidney disease, stage 3 (moderate): Secondary | ICD-10-CM

## 2017-04-07 LAB — BASIC METABOLIC PANEL
Anion gap: 9 (ref 5–15)
BUN: 23 mg/dL — ABNORMAL HIGH (ref 6–20)
CO2: 20 mmol/L — ABNORMAL LOW (ref 22–32)
Calcium: 8.4 mg/dL — ABNORMAL LOW (ref 8.9–10.3)
Chloride: 109 mmol/L (ref 101–111)
Creatinine, Ser: 1.24 mg/dL — ABNORMAL HIGH (ref 0.44–1.00)
GFR calc Af Amer: 46 mL/min — ABNORMAL LOW (ref >60)
GFR calc non Af Amer: 39 mL/min — ABNORMAL LOW (ref >60)
Glucose, Bld: 124 mg/dL — ABNORMAL HIGH (ref 65–99)
Potassium: 4.5 mmol/L (ref 3.5–5.1)
Sodium: 138 mmol/L (ref 135–145)

## 2017-04-07 LAB — CBC
HCT: 34 % — ABNORMAL LOW (ref 36.0–46.0)
Hemoglobin: 10.3 g/dL — ABNORMAL LOW (ref 12.0–15.0)
MCH: 26.3 pg (ref 26.0–34.0)
MCHC: 30.3 g/dL (ref 30.0–36.0)
MCV: 86.7 fL (ref 78.0–100.0)
Platelets: 169 10*3/uL (ref 150–400)
RBC: 3.92 MIL/uL (ref 3.87–5.11)
RDW: 18.6 % — ABNORMAL HIGH (ref 11.5–15.5)
WBC: 10.5 10*3/uL (ref 4.0–10.5)

## 2017-04-07 LAB — TROPONIN I
Troponin I: 0.7 ng/mL (ref <0.03)
Troponin I: 1.09 ng/mL (ref <0.03)

## 2017-04-07 MED ORDER — CARVEDILOL 25 MG PO TABS
25.0000 mg | ORAL_TABLET | Freq: Two times a day (BID) | ORAL | Status: DC
Start: 2017-04-07 — End: 2017-04-09
  Administered 2017-04-07 – 2017-04-09 (×6): 25 mg via ORAL
  Filled 2017-04-07 (×6): qty 1

## 2017-04-07 MED ORDER — ORAL CARE MOUTH RINSE
15.0000 mL | Freq: Two times a day (BID) | OROMUCOSAL | Status: DC
  Administered 2017-04-07 – 2017-04-09 (×2): 15 mL via OROMUCOSAL

## 2017-04-07 MED ORDER — ASPIRIN 81 MG PO CHEW
81.0000 mg | CHEWABLE_TABLET | Freq: Every day | ORAL | Status: DC
  Administered 2017-04-07 – 2017-04-09 (×3): 81 mg via ORAL
  Filled 2017-04-07 (×3): qty 1

## 2017-04-07 MED FILL — Nitroglycerin IV Soln 100 MCG/ML in D5W: INTRA_ARTERIAL | Qty: 10 | Status: AC

## 2017-04-07 NOTE — Progress Notes (Signed)
Patient is in ICU under cardiology service. hospitalist will sign off. D/w cardiology.  Valerie Ramirez

## 2017-04-07 NOTE — Progress Notes (Signed)
CRITICAL VALUE ALERT  Critical Value:  Troponin 0.70  Date & Time Notied:  04/07/17 0420  Provider Notified: No  Orders Received/Actions taken: Expected result - MD to round

## 2017-04-07 NOTE — Progress Notes (Signed)
Progress Note  Patient Name: Valerie Ramirez Date of Encounter: 04/07/2017  Primary Cardiologist: Micaella Gitto [place], MD   Subjective   Patient is feeling much better today. No chest pain overnight. Slept well.   Inpatient Medications    Scheduled Meds: . amLODipine  5 mg Oral Daily  . carvedilol  25 mg Oral BID WC  . clopidogrel  75 mg Oral Q breakfast  . DULoxetine  20 mg Oral Daily  . enoxaparin (LOVENOX) injection  40 mg Subcutaneous QHS  . mouth rinse  15 mL Mouth Rinse BID  . rosuvastatin  10 mg Oral q1800  . sodium chloride flush  3 mL Intravenous Q12H   Continuous Infusions: . sodium chloride 75 mL/hr (04/06/17 2030)  . sodium chloride     PRN Meds: sodium chloride, acetaminophen, cyclobenzaprine, gi cocktail, labetalol, morphine injection, nitroGLYCERIN, ondansetron (ZOFRAN) IV, sodium chloride flush   Vital Signs    Vitals:   04/07/17 0400 04/07/17 0500 04/07/17 0600 04/07/17 0700  BP: (!) 172/63 (!) 187/60 (!) 155/56 (!) 155/62  Pulse: (!) 105 (!) 104 96 100  Resp: 20 19 15 20   Temp:      TempSrc:      SpO2: 95% 94% 96% 96%  Weight:  235 lb 0.2 oz (106.6 kg)    Height:        Intake/Output Summary (Last 24 hours) at 04/07/2017 0747 Last data filed at 04/07/2017 0540 Gross per 24 hour  Intake 808.38 ml  Output 1050 ml  Net -241.62 ml   Filed Weights   04/05/17 1824 04/06/17 0536 04/07/17 0500  Weight: 230 lb (104.3 kg) 232 lb 4.8 oz (105.4 kg) 235 lb 0.2 oz (106.6 kg)    Telemetry   NSR with rare PVC, one triplet  ECG    NSR, LVH. ST depression inferolaterally. I have personally reviewed and interpreted this study.   Physical Exam   GEN: No acute distress.  obese Neck: No JVD Cardiac: RRR, no murmurs, rubs, or gallops.  Respiratory: Clear to auscultation bilaterally. GI: Soft, nontender, non-distended  MS: No edema; No deformity. Right  Radial site without  Hematoma. Neuro:  Nonfocal  Psych: Normal affect   Labs     Chemistry Recent Labs  Lab 04/06/17 0301 04/06/17 1819 04/06/17 2243 04/07/17 0318  NA 137 119* 137 138  K 3.9 3.1* 4.4 4.5  CL 104 82* 108 109  CO2 23  --  17* 20*  GLUCOSE 133* 74 152* 124*  BUN 30* 24* 24* 23*  CREATININE 1.43* 0.60 1.26* 1.24*  CALCIUM 8.8*  --  8.6* 8.4*  PROT 6.1*  --   --   --   ALBUMIN 3.2*  --   --   --   AST 15  --   --   --   ALT 13*  --   --   --   ALKPHOS 105  --   --   --   BILITOT 0.7  --   --   --   GFRNONAA 33*  --  39* 39*  GFRAA 38*  --  45* 46*  ANIONGAP 10  --  12 9     Hematology Recent Labs  Lab 04/04/17 1817 04/06/17 1819 04/06/17 2243  WBC 9.6  --  11.6*  RBC 4.22  --  4.21  HGB 11.2* 10.9* 11.1*  HCT 35.9* 32.0* 36.3  MCV 85.1  --  86.2  MCH 26.5  --  26.4  MCHC 31.2  --  30.6  RDW 17.4*  --  18.5*  PLT 206  --  184    Cardiac Enzymes Recent Labs  Lab 04/05/17 0555 04/05/17 0843 04/06/17 1730 04/07/17 0318  TROPONINI <0.03 <0.03 0.11* 0.70*    Recent Labs  Lab 04/04/17 1859 04/05/17 0100  TROPIPOC 0.03 0.02     BNPNo results for input(s): BNP, PROBNP in the last 168 hours.   DDimer No results for input(s): DDIMER in the last 168 hours.   Radiology    No results found.  Cardiac Studies   Echo  Study Conclusions  - Left ventricle: The cavity size was normal. Wall thickness was   increased in a pattern of severe LVH. Systolic function was   normal. The estimated ejection fraction was in the range of 55%   to 60%. Wall motion was normal; there were no regional wall   motion abnormalities. Doppler parameters are consistent with   pseudonormal left ventricular relaxation (grade 2 diastolic   dysfunction). The E/e&' ratio is >15, suggesting elevated LV   filling pressure. - Aortic valve: Sclerosis without stenosis. Transvalvular velocity   was minimally increased. Mean gradient (S): 7 mm Hg. - Mitral valve: Mildly thickened leaflets . There was trivial   regurgitation. - Left atrium: Moderately  dilated. - Tricuspid valve: There was mild regurgitation. - Pulmonary arteries: PA peak pressure: 34 mm Hg (S). - Inferior vena cava: The vessel was normal in size. The   respirophasic diameter changes were in the normal range (>= 50%),   consistent with normal central venous pressure.  Impressions:  - LVEF 55-60%, severe LVH, normal wall motion, grade 2 DD with   elevated LV filling pressure, aortic valve sclerosis, trivial MR,   moderate LAE, mild TR, RVSP 34 mmHg, normal IVC.  Procedures   LEFT HEART CATH AND CORONARY ANGIOGRAPHY  Conclusion     Prox RCA lesion is 60% stenosed.  Prox Cx to Mid Cx lesion is 95% stenosed.  Ost LAD to Prox LAD lesion is 40% stenosed.   Findings:  1. LM ok 2. LAD 40% proximal 3. LCx 95% mid 4. RCA 60% proxima   Assessment:  Significant 1v CAD with 95% mid LCX lesion   Plan/Discussion:  Plan PCI LCX.   Glori Bickers, MD  5:36 PM   Procedures   CORONARY STENT INTERVENTION  Conclusion     Mid Cx lesion is 95% stenosed.  A drug-eluting stent was successfully placed using a STENT SVELTE RX 3.50 X 28MM.  Post intervention, there is a 0% residual stenosis.   Successful angioplasty and drug-eluting stent placement to the mid left circumflex.  Recommendations: It is not entirely clear why the patient became sick and hypotensive just before starting PCI.  She complained of significant back pain and vomited once.  Most likely, she dropped her blood pressure with hydralazine which led to coronary hypoperfusion.  There was no signs of bleeding.  We checked hemoglobin and it was 10.9.  The patient's symptoms improved after PCI to the left circumflex. Continue dual antiplatelet therapy for at least one year.  Monitor closely in the ICU.      Patient Profile     83 y.o. female with a hx of  With hx of HTN, HLD, hypothyroidism and secondary exposure to tobacco smoking who is being seen for  6 months hx of DOE and  chest pressure. She takes multiple doses of ASA and Tylenol for chronic back and knee pain. She uses at least 1 bottle  of 50 tables of 500mg  ASA/week.   Assessment & Plan    1. NSTEMI.  - Ongoing for at least 6 months. EKG with more prominent ST depression inferior laterally. Enzyme leak. Echo showed normal LVEF with grade 2 DD.  - Cath demonstrated critical stenosis in a very large LCx vessel. Otherwise nonobstructive disease. S/p DES of LCx.  - Continue DAPT  and statin. Will transfer to telemetry and ambulate. Hopefully DC in am.  2. HLD - 04/06/2017: Cholesterol 222; HDL 49; LDL Cholesterol 140; Triglycerides 165; VLDL 33  - Allergic to Lipitor and Simvastatin. Started low dose Crestor. Outpatient lipid clinic referral.   3. Sinus bradycardia - HR currently in 80s  - will increase Coreg and monitor.   4. Hypertensive urgency - Exacerbated by NSAIDS use.  - increase amlodipine to 5 mg daily.  - increase Coreg to 25 mg bid.  - Avoid ACEi/ARB now due to CKD.  4. Acute on CKD stage III - Baseline Scr around 1.2-1.3.  Renal indices stable.   5. Hypothyroidism     For questions or updates, please contact [place] Please consult [email or web address] for contact info under Cardiology/STEMI.      Signed,  Demarqus Jocson [place], [place] 04/07/2017 7:47 AM

## 2017-04-07 NOTE — Progress Notes (Signed)
CARDIAC REHAB PHASE I   PRE:  Rate/Rhythm: 86 SR  BP:  Supine: 168/67  Sitting:   Standing:    SaO2: 93% 2L  MODE:  Ambulation: 140 ft   POST:  Rate/Rhythm: 106 ST  BP:  Supine:   Sitting: 163/58  Standing:    SaO2: 90-91%RA after walk      89% in recliner  Put on 2L 1415-1453 Pt walked 140 ft on RA with gait belt use. Pt did not want to use rolling walker. Stated she felt steady even though she seemed a little wobbly. Pt stated she has her husband's rollator and walkers at home if needed. Stressed importance of plavix with stent. Call bell in reach. Denied CP and no complaints.   Graylon Good, RN BSN  04/07/2017 2:49 PM

## 2017-04-07 NOTE — Care Management Note (Signed)
Case Management Note  Previous Nurse Case Manager Note: Jonnie Finner  Patient Details  Name: Valerie Ramirez MRN: [id number] Date of Birth: 1932/12/18  Subjective/Objective: Chest pain, lives at home alone. She was independent prior to hospital stay . No DME needed. Scheduled cardiac cath 04/06/2017. Has good family support  12/ 20 1608 [place], BSN- s/p coronary stent intervention will be on plavix.                      Action/Plan: NCM will follow for dc needs.   Expected Discharge Date:                  Expected Discharge Plan:  Home/Self Care  In-House Referral:  NA  Discharge planning Services  CM Consult  Post Acute Care Choice:  NA Choice offered to:  NA  DME Arranged:  [name] DME Agency:  NA  HH Arranged:  NA HH Agency:  NA  Status of Service:  In process, will continue to follow  If discussed at Long Length of Stay Meetings, dates discussed:    Additional Comments:  Zenon Mayo, RN 04/07/2017, 4:08 PM

## 2017-04-08 MED ORDER — LOSARTAN POTASSIUM 25 MG PO TABS
25.0000 mg | ORAL_TABLET | Freq: Every day | ORAL | Status: DC
  Administered 2017-04-08 – 2017-04-09 (×2): 25 mg via ORAL
  Filled 2017-04-08 (×2): qty 1

## 2017-04-08 MED ORDER — DOXAZOSIN MESYLATE 2 MG PO TABS
2.0000 mg | ORAL_TABLET | Freq: Every day | ORAL | Status: DC
  Administered 2017-04-08: 2 mg via ORAL
  Filled 2017-04-08: qty 1

## 2017-04-08 MED ORDER — HYDRALAZINE HCL 20 MG/ML IJ SOLN
10.0000 mg | Freq: Once | INTRAMUSCULAR | Status: AC
  Administered 2017-04-08: 10 mg via INTRAVENOUS
  Filled 2017-04-08: qty 1

## 2017-04-08 MED ORDER — COLCHICINE 0.6 MG PO TABS
0.6000 mg | ORAL_TABLET | Freq: Two times a day (BID) | ORAL | Status: DC
  Administered 2017-04-08 – 2017-04-09 (×3): 0.6 mg via ORAL
  Filled 2017-04-08 (×3): qty 1

## 2017-04-08 MED ORDER — AMLODIPINE BESYLATE 10 MG PO TABS
10.0000 mg | ORAL_TABLET | Freq: Every day | ORAL | Status: DC
  Administered 2017-04-08 – 2017-04-09 (×2): 10 mg via ORAL
  Filled 2017-04-08 (×2): qty 1

## 2017-04-08 NOTE — Progress Notes (Signed)
CARDIAC REHAB PHASE I   PRE:  Rate/Rhythm: 84 SR  BP:  Supine: 140/60  Sitting:   Standing:    SaO2: 91%RA  MODE:  Ambulation: to outside of door ft   POST:  Rate/Rhythm: 91 SR  BP:  Supine:   Sitting: 132/50  Standing:    SaO2: 89-90%RA 1250-1330 Pt walked outside of door and to recliner with rolling walker and gait belt use. Pt stated she has gout and feet are hurting. Only able to walk short distance. Set up lunch after heating it. Brief ed done with pt. Encouraged her to walk as tolerated with walker at home. She told me she has walker at home. Stressed importance of plavix with stent. Gave heart healthy diet and reviewed NTG use,  Discussed CRP 2 and will refer to [facility]. Gave MI booklet. RN to ask family about her stent card. Pt stated she does not have it and it is not in chart.   Graylon Good, RN BSN  04/08/2017 1:24 PM

## 2017-04-08 NOTE — Progress Notes (Signed)
Paged Cardiology PA on call about patient's sustained SBP > 180 after giving prn labetalol @ 0500. PA to put in another prn order. Will continue to monitor patient closely.

## 2017-04-08 NOTE — Progress Notes (Signed)
Pt received from [place]. Pt and family oriented to room and equipment. VSS. Telemetry applied. Call bell within reach, will continue to monitor.   Fritz Pickerel, RN

## 2017-04-08 NOTE — Progress Notes (Signed)
Progress Note  Patient Name: Valerie Ramirez Date of Encounter: 04/08/2017  Primary Cardiologist: Noni Stonesifer [place], MD   Subjective   No chest pain or dyspnea. Complains of HA and pain in feet. History of gout.   Inpatient Medications    Scheduled Meds: . amLODipine  10 mg Oral Daily  . aspirin  81 mg Oral Daily  . carvedilol  25 mg Oral BID WC  . clopidogrel  75 mg Oral Q breakfast  . colchicine  0.6 mg Oral BID  . doxazosin  2 mg Oral QHS  . DULoxetine  20 mg Oral Daily  . enoxaparin (LOVENOX) injection  40 mg Subcutaneous QHS  . losartan  25 mg Oral Daily  . mouth rinse  15 mL Mouth Rinse BID  . rosuvastatin  10 mg Oral q1800  . sodium chloride flush  3 mL Intravenous Q12H   Continuous Infusions: . sodium chloride     PRN Meds: sodium chloride, acetaminophen, cyclobenzaprine, gi cocktail, labetalol, morphine injection, nitroGLYCERIN, ondansetron (ZOFRAN) IV, sodium chloride flush   Vital Signs    Vitals:   04/08/17 0500 04/08/17 0536 04/08/17 0600 04/08/17 0700  BP: (!) 190/74 (!) 168/61 (!) 192/60 (!) 163/79  Pulse: 78 81 80 83  Resp: (!) 21 (!) 23 (!) 26 19  Temp:      TempSrc:      SpO2: 100% 100% 100% 100%  Weight: 229 lb 0.9 oz (103.9 kg)     Height:        Intake/Output Summary (Last 24 hours) at 04/08/2017 0739 Last data filed at 04/08/2017 0000 Gross per 24 hour  Intake -  Output 250 ml  Net -250 ml   Filed Weights   04/06/17 0536 04/07/17 0500 04/08/17 0500  Weight: 232 lb 4.8 oz (105.4 kg) 235 lb 0.2 oz (106.6 kg) 229 lb 0.9 oz (103.9 kg)    Telemetry   NSR - I have personally reviewed and interpreted this study.   ECG    None today   Physical Exam   GENERAL:  Well appearing, obese WF in NAD HEENT:  PERRL, EOMI, sclera are clear. Oropharynx is clear. NECK:  No jugular venous distention, carotid upstroke brisk and symmetric, no bruits, no thyromegaly or adenopathy LUNGS:  Clear to auscultation bilaterally CHEST:   Unremarkable HEART:  RRR,  PMI not displaced or sustained,S1 and S2 within normal limits, no S3, no S4: no clicks, no rubs, no murmurs ABD:  Soft, nontender. BS +, no masses or bruits. No hepatomegaly, no splenomegaly EXT:  2 + pulses throughout, no edema, no cyanosis no clubbing. Pain to palpation in lateral MTP joints.  SKIN:  Warm and dry.  No rashes NEURO:  Alert and oriented x 3. Cranial nerves II through XII intact. PSYCH:  Cognitively intact    Labs    Chemistry Recent Labs  Lab 04/06/17 0301 04/06/17 1819 04/06/17 2243 04/07/17 0318  NA 137 119* 137 138  K 3.9 3.1* 4.4 4.5  CL 104 82* 108 109  CO2 23  --  17* 20*  GLUCOSE 133* 74 152* 124*  BUN 30* 24* 24* 23*  CREATININE 1.43* 0.60 1.26* 1.24*  CALCIUM 8.8*  --  8.6* 8.4*  PROT 6.1*  --   --   --   ALBUMIN 3.2*  --   --   --   AST 15  --   --   --   ALT 13*  --   --   --  ALKPHOS 105  --   --   --   BILITOT 0.7  --   --   --   GFRNONAA 33*  --  39* 39*  GFRAA 38*  --  45* 46*  ANIONGAP 10  --  12 9     Hematology Recent Labs  Lab 04/04/17 1817 04/06/17 1819 04/06/17 2243 04/07/17 0241  WBC 9.6  --  11.6* 10.5  RBC 4.22  --  4.21 3.92  HGB 11.2* 10.9* 11.1* 10.3*  HCT 35.9* 32.0* 36.3 34.0*  MCV 85.1  --  86.2 86.7  MCH 26.5  --  26.4 26.3  MCHC 31.2  --  30.6 30.3  RDW 17.4*  --  18.5* 18.6*  PLT 206  --  184 169    Cardiac Enzymes Recent Labs  Lab 04/05/17 0843 04/06/17 1730 04/07/17 0318 04/07/17 0928  TROPONINI <0.03 0.11* 0.70* 1.09*    Recent Labs  Lab 04/04/17 1859 04/05/17 0100  TROPIPOC 0.03 0.02     BNPNo results for input(s): BNP, PROBNP in the last 168 hours.   DDimer No results for input(s): DDIMER in the last 168 hours.   Radiology    No results found.  Cardiac Studies   Echo  Study Conclusions  - Left ventricle: The cavity size was normal. Wall thickness was   increased in a pattern of severe LVH. Systolic function was   normal. The estimated ejection  fraction was in the range of 55%   to 60%. Wall motion was normal; there were no regional wall   motion abnormalities. Doppler parameters are consistent with   pseudonormal left ventricular relaxation (grade 2 diastolic   dysfunction). The E/e&' ratio is >15, suggesting elevated LV   filling pressure. - Aortic valve: Sclerosis without stenosis. Transvalvular velocity   was minimally increased. Mean gradient (S): 7 mm Hg. - Mitral valve: Mildly thickened leaflets . There was trivial   regurgitation. - Left atrium: Moderately dilated. - Tricuspid valve: There was mild regurgitation. - Pulmonary arteries: PA peak pressure: 34 mm Hg (S). - Inferior vena cava: The vessel was normal in size. The   respirophasic diameter changes were in the normal range (>= 50%),   consistent with normal central venous pressure.  Impressions:  - LVEF 55-60%, severe LVH, normal wall motion, grade 2 DD with   elevated LV filling pressure, aortic valve sclerosis, trivial MR,   moderate LAE, mild TR, RVSP 34 mmHg, normal IVC.  Procedures   LEFT HEART CATH AND CORONARY ANGIOGRAPHY  Conclusion     Prox RCA lesion is 60% stenosed.  Prox Cx to Mid Cx lesion is 95% stenosed.  Ost LAD to Prox LAD lesion is 40% stenosed.   Findings:  1. LM ok 2. LAD 40% proximal 3. LCx 95% mid 4. RCA 60% proxima   Assessment:  Significant 1v CAD with 95% mid LCX lesion   Plan/Discussion:  Plan PCI LCX.   Glori Bickers, MD  5:36 PM   Procedures   CORONARY STENT INTERVENTION  Conclusion     Mid Cx lesion is 95% stenosed.  A drug-eluting stent was successfully placed using a STENT SVELTE RX 3.50 X 28MM.  Post intervention, there is a 0% residual stenosis.   Successful angioplasty and drug-eluting stent placement to the mid left circumflex.  Recommendations: It is not entirely clear why the patient became sick and hypotensive just before starting PCI.  She complained of significant back  pain and vomited once.  Most likely, she  dropped her blood pressure with hydralazine which led to coronary hypoperfusion.  There was no signs of bleeding.  We checked hemoglobin and it was 10.9.  The patient's symptoms improved after PCI to the left circumflex. Continue dual antiplatelet therapy for at least one year.  Monitor closely in the ICU.      Patient Profile     83 y.o. female with a hx of  With hx of HTN, HLD, hypothyroidism and secondary exposure to tobacco smoking who is being seen for  6 months hx of DOE and chest pressure. She takes multiple doses of ASA and Tylenol for chronic back and knee pain. She uses at least 1 bottle of 50 tables of 500mg  ASA/week.   Assessment & Plan    1. NSTEMI.  -  EKG with more prominent ST depression inferior laterally. Enzyme leak. Echo showed normal LVEF with grade 2 DD.  - Cath demonstrated critical stenosis in a very large LCx vessel. Otherwise nonobstructive disease. S/p DES of LCx.  - Continue DAPT for one year and statin. Will transfer to telemetry and ambulate.   2. HLD - 04/06/2017: Cholesterol 222; HDL 49; LDL Cholesterol 140; Triglycerides 165; VLDL 33  - Allergic to Lipitor and Simvastatin. Started low dose Crestor. Outpatient lipid clinic referral.   3. Sinus bradycardia- resolved.  - HR currently in 80s  - will continue Coreg and monitor.   4. Hypertensive urgency- BP still poorly controlled. - Exacerbated by NSAIDS/high dose ASA use.  - increase amlodipine to 10 mg daily.  -  Coreg to 25 mg bid.  -  Renal functio back to baseline- will add losartan 25 mg daily -  Resume Cardura 2 mg at bedtime.   4. Acute on CKD stage III - Baseline Scr around 1.2-1.3.  Renal indices stable. Repeat BMT in am  5. Hypothyroidism  6. Gout. Start colchicine.   Possible DC tomorrow if BP improved.      For questions or updates, please contact [place] Please consult [email or web address] for contact info under Cardiology/STEMI.       Signed,  Lavonia Eager [place], [place] 04/08/2017 7:39 AM

## 2017-04-09 DIAGNOSIS — R079 Chest pain, unspecified: Secondary | ICD-10-CM

## 2017-04-09 MED ORDER — NITROGLYCERIN 0.4 MG SL SUBL: 0.4000 mg | SUBLINGUAL_TABLET | SUBLINGUAL | 12 refills | Status: AC | PRN

## 2017-04-09 MED ORDER — DOXAZOSIN MESYLATE 2 MG PO TABS: ORAL_TABLET | ORAL | 0 refills | Status: DC

## 2017-04-09 MED ORDER — LOSARTAN POTASSIUM 25 MG PO TABS: 25.0000 mg | ORAL_TABLET | Freq: Every day | ORAL | 11 refills | Status: DC

## 2017-04-09 MED ORDER — ASPIRIN 81 MG PO CHEW: 81.0000 mg | CHEWABLE_TABLET | Freq: Every day | ORAL | Status: DC

## 2017-04-09 MED ORDER — ROSUVASTATIN CALCIUM 10 MG PO TABS: 10.0000 mg | ORAL_TABLET | Freq: Every day | ORAL | 11 refills | Status: DC

## 2017-04-09 MED ORDER — CLOPIDOGREL BISULFATE 75 MG PO TABS: 75.0000 mg | ORAL_TABLET | Freq: Every day | ORAL | 11 refills | Status: DC

## 2017-04-09 MED ORDER — CARVEDILOL 25 MG PO TABS: 25.0000 mg | ORAL_TABLET | Freq: Two times a day (BID) | ORAL | 11 refills | Status: DC

## 2017-04-09 NOTE — Progress Notes (Signed)
Cardiac Rehab Note: Cardiac Rehab RN in to ambulate with patient. Patient and daughter states patient has been ambulating today. Patient in process of going home. Patient and Daughter deny need for education reinforcement.

## 2017-04-09 NOTE — Progress Notes (Signed)
Progress Note  Patient Name: Valerie Ramirez Date of Encounter: 04/09/2017  Primary Cardiologist: Peter Martinique, MD   Subjective   No chest or back pain. Wants to go home.   Inpatient Medications    Scheduled Meds: . amLODipine  10 mg Oral Daily  . aspirin  81 mg Oral Daily  . carvedilol  25 mg Oral BID WC  . clopidogrel  75 mg Oral Q breakfast  . colchicine  0.6 mg Oral BID  . doxazosin  2 mg Oral QHS  . DULoxetine  20 mg Oral Daily  . enoxaparin (LOVENOX) injection  40 mg Subcutaneous QHS  . losartan  25 mg Oral Daily  . mouth rinse  15 mL Mouth Rinse BID  . rosuvastatin  10 mg Oral q1800  . sodium chloride flush  3 mL Intravenous Q12H   Continuous Infusions: . sodium chloride     PRN Meds: sodium chloride, acetaminophen, cyclobenzaprine, gi cocktail, labetalol, morphine injection, nitroGLYCERIN, ondansetron (ZOFRAN) IV, sodium chloride flush   Vital Signs    Vitals:   04/08/17 0800 04/08/17 1150 04/08/17 2015 04/09/17 0237  BP: (!) 158/60  (!) 139/48   Pulse: 82 71 80   Resp:  19 (!) 32 20  Temp: 97.8 F (36.6 C)  99 F (37.2 C)   TempSrc: Oral     SpO2: 98% 94% 96%   Weight:    231 lb 4.8 oz (104.9 kg)  Height:        Intake/Output Summary (Last 24 hours) at 04/09/2017 1118 Last data filed at 04/09/2017 0810 Gross per 24 hour  Intake -  Output 200 ml  Net -200 ml   Filed Weights   04/07/17 0500 04/08/17 0500 04/09/17 0237  Weight: 235 lb 0.2 oz (106.6 kg) 229 lb 0.9 oz (103.9 kg) 231 lb 4.8 oz (104.9 kg)    Telemetry    nsr - Personally Reviewed  ECG    none - Personally Reviewed  Physical Exam   GEN: Well appearing, No acute distress.   Neck: No JVD Cardiac: RRR, no murmurs, rubs, or gallops.  Respiratory: Clear to auscultation bilaterally. GI: Soft, nontender, non-distended  MS: No edema; No deformity. Neuro:  Nonfocal  Psych: Normal affect   Labs    Chemistry Recent Labs  Lab 04/06/17 0301 04/06/17 1819 04/06/17 2243  04/07/17 0318  NA 137 119* 137 138  K 3.9 3.1* 4.4 4.5  CL 104 82* 108 109  CO2 23  --  17* 20*  GLUCOSE 133* 74 152* 124*  BUN 30* 24* 24* 23*  CREATININE 1.43* 0.60 1.26* 1.24*  CALCIUM 8.8*  --  8.6* 8.4*  PROT 6.1*  --   --   --   ALBUMIN 3.2*  --   --   --   AST 15  --   --   --   ALT 13*  --   --   --   ALKPHOS 105  --   --   --   BILITOT 0.7  --   --   --   GFRNONAA 33*  --  39* 39*  GFRAA 38*  --  45* 46*  ANIONGAP 10  --  12 9     Hematology Recent Labs  Lab 04/04/17 1817 04/06/17 1819 04/06/17 2243 04/07/17 0241  WBC 9.6  --  11.6* 10.5  RBC 4.22  --  4.21 3.92  HGB 11.2* 10.9* 11.1* 10.3*  HCT 35.9* 32.0* 36.3 34.0*  MCV 85.1  --  86.2 86.7  MCH 26.5  --  26.4 26.3  MCHC 31.2  --  30.6 30.3  RDW 17.4*  --  18.5* 18.6*  PLT 206  --  184 169    Cardiac Enzymes Recent Labs  Lab 04/05/17 0843 04/06/17 1730 04/07/17 0318 04/07/17 0928  TROPONINI <0.03 0.11* 0.70* 1.09*    Recent Labs  Lab 04/04/17 1859 04/05/17 0100  TROPIPOC 0.03 0.02     BNPNo results for input(s): BNP, PROBNP in the last 168 hours.   DDimer No results for input(s): DDIMER in the last 168 hours.   Radiology    No results found.  Cardiac Studies   none  Patient Profile     83 y.o. female admitted with NSTEMI and underwent PCI.  Assessment & Plan    1. NSTEMI - she is s/p PCI and will need to be discharged home on DAPT for a year. 2. HTN - her blood pressure is improved. Continue current meds. Miight need uptitration as an outpatient. 3. Dyslipidemia - she has been started on low dose crestor. Followup in lipid clinic. PCSK9 inhibitor would be a strong consideration. 4. ASA abuse - I strongly encouraged her not to take any more asa except with her plavix, 81 mg daily.   For questions or updates, please contact [place] Please consult [email or web address] for contact info under Cardiology/STEMI.      Signed, Cristopher Peru, MD  04/09/2017, 11:18 AM  Patient ID:  Valerie Ramirez, female   DOB: March 10, 1933, 83 y.o.   MRN: [id number]

## 2017-04-09 NOTE — Discharge Summary (Signed)
Discharge Summary    Patient ID: Valerie Ramirez,  MRN: [id number], DOB/AGE: 81-04-1932 81 y.o.  Admit date: 04/05/2017 Discharge date: 04/09/2017  Primary Care Provider: Loura Pardon A Primary Cardiologist: Peter Martinique, MD  Discharge Diagnoses    Principal Problem:   Exertional chest pain Active Problems:   Hypertensive urgency   CKD (chronic kidney disease), stage III (HCC)   Normocytic normochromic anemia   Unstable angina (HCC)   Allergies Allergies  Allergen Reactions  . Allopurinol Nausea Only  . Amoxicillin-Pot Clavulanate     REACTION: stomach upset  . Lipitor [Atorvastatin Calcium] Other (See Comments)    Leg ache and ache all over  . Simvastatin     REACTION: muscle pain  . Valium [Diazepam] Other (See Comments)    "drives me crazy"  . Tape Itching and Rash    "Cannot use paper tape" --- also causes blisters. Use plastic tape    Diagnostic Studies/Procedures    ECHO: 04/05/2017 - Left ventricle: The cavity size was normal. Wall thickness was   increased in a pattern of severe LVH. Systolic function was   normal. The estimated ejection fraction was in the range of 55%   to 60%. Wall motion was normal; there were no regional wall   motion abnormalities. Doppler parameters are consistent with   pseudonormal left ventricular relaxation (grade 2 diastolic   dysfunction). The E/e&' ratio is >15, suggesting elevated LV   filling pressure. - Aortic valve: Sclerosis without stenosis. Transvalvular velocity   was minimally increased. Mean gradient (S): 7 mm Hg. - Mitral valve: Mildly thickened leaflets . There was trivial   regurgitation. - Left atrium: Moderately dilated. - Tricuspid valve: There was mild regurgitation. - Pulmonary arteries: PA peak pressure: 34 mm Hg (S). - Inferior vena cava: The vessel was normal in size. The   respirophasic diameter changes were in the normal range (>= 50%),   consistent with normal central venous  pressure. Impressions: - LVEF 55-60%, severe LVH, normal wall motion, grade 2 DD with   elevated LV filling pressure, aortic valve sclerosis, trivial MR,   moderate LAE, mild TR, RVSP 34 mmHg, normal IVC.  CARDIAC CATH: 04/06/2017  Prox RCA lesion is 60% stenosed.  Prox Cx to Mid Cx lesion is 95% stenosed.  Ost LAD to Prox LAD lesion is 40% stenosed.  Findings: 1. LM ok 2. LAD 40% proximal 3. LCx 95% mid 4. RCA 60% proxima  Assessment: Significant 1v CAD with 95% mid LCX lesion Diagnostic Diagram       PCI: 04/06/2017  Mid Cx lesion is 95% stenosed.  A drug-eluting stent was successfully placed using a STENT SVELTE RX 3.50 X 28MM.  Post intervention, there is a 0% residual stenosis.  Successful angioplasty and drug-eluting stent placement to the mid left circumflex. Recommendations: It is not entirely clear why the patient became sick and hypotensive just before starting PCI.  She complained of significant back pain and vomited once.  Most likely, she dropped her blood pressure with hydralazine which led to coronary hypoperfusion.  There was no signs of bleeding.  We checked hemoglobin and it was 10.9.  The patient's symptoms improved after PCI to the left circumflex. Continue dual antiplatelet therapy for at least one year.  Monitor closely in the ICU. Post-Intervention Diagram          _____________   History of Present Illness     83 y.o. female with a hx of With hx of HTN,  HLD, hypothyroidism and secondary exposure to tobacco smokingwho is being seen for 6 months hx of DOE and chest pressure. She takes multiple doses of ASA and Tylenol for chronic back and knee pain. She uses at least 1 bottle of 50 tables of 500mg  ASA/week.   Hospital Course     Consultants: None  1. NSTEMI - She is s/p DES to the circumflex, residual disease in the RCA and LAD to be treated medically.EKG with more prominent ST depression inferior laterally.  She will need to be  discharged home on DAPT for a year.  She is on aspirin, Plavix, beta-blocker, and statin.  EF is preserved.  2. HTN - her blood pressure is improved. Continue current meds. Miight need uptitration as an outpatient.  Beta-blocker was changed from metoprolol to Coreg.  She was changed from lisinopril to Cozaar.  Her Cardura was decreased.  She is being discharged on the medication she is currently on in the hospital.  3. Dyslipidemia - she has been started on low dose crestor. Followup in lipid clinic. PCSK9 inhibitor would be a strong consideration.  She has not tolerated high dose statins in the past.  4. ASA abuse - I strongly encouraged her not to take any more asa except with her plavix, 81 mg daily.   Stop her high dose aspirin tablet as that also contains caffeine.  Okay to use Aleve which she was already doing, but minimize use while on aspirin and Plavix..  5.  CKD III: Her GFR was 46 in January 2018, it was 33 on admission and is up to 39.  She must decrease her NSAID and Tylenol use or her renal function will worsen.  6.  History of gout, possible flare: Continue colchicine.   _____________  Discharge Vitals Blood pressure (!) 143/53, pulse 76, temperature 97.8 F (36.6 C), temperature source Oral, resp. rate 20, height 5\' 2"  (1.575 m), weight 231 lb 4.8 oz (104.9 kg), SpO2 94 %.  Filed Weights   04/07/17 0500 04/08/17 0500 04/09/17 0237  Weight: 235 lb 0.2 oz (106.6 kg) 229 lb 0.9 oz (103.9 kg) 231 lb 4.8 oz (104.9 kg)    Labs & Radiologic Studies    CBC Recent Labs    04/06/17 2243 04/07/17 0241  WBC 11.6* 10.5  HGB 11.1* 10.3*  HCT 36.3 34.0*  MCV 86.2 86.7  PLT 184 182   Basic Metabolic Panel Recent Labs    04/06/17 2243 04/07/17 0318  NA 137 138  K 4.4 4.5  CL 108 109  CO2 17* 20*  GLUCOSE 152* 124*  BUN 24* 23*  CREATININE 1.26* 1.24*  CALCIUM 8.6* 8.4*   Liver Function Tests Lab Results  Component Value Date   ALT 13 (L) 04/06/2017   AST 15  04/06/2017   ALKPHOS 105 04/06/2017   BILITOT 0.7 04/06/2017   Cardiac Enzymes Recent Labs    04/06/17 1730 04/07/17 0318 04/07/17 0928  CKTOTAL 118  --   --   CKMB 3.7  --   --   TROPONINI 0.11* 0.70* 1.09*    Fasting Lipid Panel Lab Results  Component Value Date   CHOL 222 (H) 04/06/2017   HDL 49 04/06/2017   LDLCALC 140 (H) 04/06/2017   LDLDIRECT 136.0 10/31/2015   TRIG 165 (H) 04/06/2017   CHOLHDL 4.5 04/06/2017    Thyroid Function Tests Lab Results  Component Value Date   TSH 4.855 (H) 04/05/2017   _____________  Dg Chest 2 View  Result Date:  04/04/2017 CLINICAL DATA:  Chest pain EXAM: CHEST  2 VIEW COMPARISON:  08/08/2015 FINDINGS: Heart size mildly enlarged. Atherosclerotic aorta. Negative for heart failure. Lungs are clear without infiltrate or effusion. IMPRESSION: No active cardiopulmonary disease. Electronically Signed   By: Franchot Gallo M.D.   On: 04/04/2017 19:20   Disposition   Pt is being discharged home today in good condition.  Follow-up Plans & Appointments    Follow-up Information    Martinique, Peter M, MD Follow up.   Specialty:  Cardiology Why:  The office will call Contact information: [address] [place] [address] [phone]          Discharge Instructions    AMB Referral to Cardiac Rehabilitation - Phase II   Complete by:  As directed    Diagnosis:  Coronary Stents   Amb Referral to Cardiac Rehabilitation   Complete by:  As directed    Diagnosis:   NSTEMI Coronary Stents     Diet - low sodium heart healthy   Complete by:  As directed    Increase activity slowly   Complete by:  As directed       Discharge Medications   Allergies as of 04/09/2017      Reactions   Allopurinol Nausea Only   Amoxicillin-pot Clavulanate    REACTION: stomach upset   Lipitor [atorvastatin Calcium] Other (See Comments)   Leg ache and ache all over   Simvastatin    REACTION: muscle pain   Valium [diazepam] Other (See  Comments)   "drives me crazy"   Tape Itching, Rash   "Cannot use paper tape" --- also causes blisters. Use plastic tape      Medication List    STOP taking these medications   BAYER BACK & BODY PO   lisinopril 5 MG tablet Commonly known as:  PRINIVIL,ZESTRIL   metoprolol tartrate 100 MG tablet Commonly known as:  LOPRESSOR     TAKE these medications   acetaminophen 650 MG CR tablet Commonly known as:  TYLENOL Take 650 mg by mouth every 8 (eight) hours as needed for pain.   amLODipine 10 MG tablet Commonly known as:  NORVASC Take 1 tablet (10 mg total) by mouth daily.   aspirin 81 MG chewable tablet Chew 1 tablet (81 mg total) by mouth daily. Start taking on:  04/10/2017   carvedilol 25 MG tablet Commonly known as:  COREG Take 1 tablet (25 mg total) by mouth 2 (two) times daily with a meal.   clopidogrel 75 MG tablet Commonly known as:  PLAVIX Take 1 tablet (75 mg total) by mouth daily with breakfast. Start taking on:  04/10/2017   colchicine 0.6 MG tablet TAKE ONE TABLET TWO TIMES A DAY WITH FOOD AS NEEDED GOUT   cyclobenzaprine 10 MG tablet Commonly known as:  FLEXERIL Take 10 mg by mouth 3 (three) times daily as needed for muscle spasms.   doxazosin 2 MG tablet Commonly known as:  CARDURA TAKE 1 TABLET (8 MG TOTAL) BY MOUTH AT BEDTIME. What changed:  medication strength   DULoxetine 20 MG capsule Commonly known as:  CYMBALTA TAKE 1 CAPSULE (20 MG TOTAL) BY MOUTH DAILY.   ECHINACEA PO Take 760 mg by mouth 2 (two) times daily.   HYDROcodone-acetaminophen 5-325 MG tablet Commonly known as:  NORCO/VICODIN Take 1-2 tablets by mouth every 6 (six) hours as needed.   loperamide 2 MG capsule Commonly known as:  IMODIUM Take 1 capsule (2 mg total) by mouth as  needed for diarrhea or loose stools.   losartan 25 MG tablet Commonly known as:  COZAAR Take 1 tablet (25 mg total) by mouth daily. Start taking on:  04/10/2017   naproxen sodium 220 MG  tablet Commonly known as:  ALEVE Take 220 mg by mouth daily as needed (back & muscle pain). Notes to patient:  Minimize use while on aspirin and Plavix.   nitroGLYCERIN 0.4 MG SL tablet Commonly known as:  NITROSTAT Place 1 tablet (0.4 mg total) under the tongue every 5 (five) minutes x 3 doses as needed for chest pain.   rosuvastatin 10 MG tablet Commonly known as:  CRESTOR Take 1 tablet (10 mg total) by mouth daily at 6 PM.   vitamin B-12 1000 MCG tablet Commonly known as:  CYANOCOBALAMIN Take 1,000 mcg by mouth daily.        Aspirin prescribed at discharge?  Yes High Intensity Statin Prescribed? (Lipitor 40-80mg  or Crestor 20-40mg ): No: Intolerant to high-dose statins Beta Blocker Prescribed? Yes For EF <40%, was ACEI/ARB Prescribed? No: EF is greater than 40 ADP Receptor Inhibitor Prescribed? (i.e. Plavix etc.-Includes Medically Managed Patients): Yes For EF <40%, Aldosterone Inhibitor Prescribed? No: EF is greater than 40 Was EF assessed during THIS hospitalization? Yes Was Cardiac Rehab II ordered? (Included Medically managed Patients): Yes   Outstanding Labs/Studies   None  Duration of Discharge Encounter   Greater than 30 minutes including physician time.  Alcario Drought [name] NP 04/09/2017, 4:00 PM  EP Attending  Patient seen and examined. Agree with above. She is stable for DC home. Usual followup as outlined above.   Mikle Bosworth.D.

## 2017-04-11 ENCOUNTER — Telehealth: Payer: Self-pay | Admitting: Cardiology

## 2017-04-11 NOTE — Telephone Encounter (Signed)
TCM Call: Patient is scheduled with [facility] with 04-22-17 @10  am.

## 2017-04-11 NOTE — Telephone Encounter (Signed)
Patient contacted regarding discharge from [facility] on 12/22.  Patient understands to follow up with provider Almyra Deforest on 04/22/17 at 10:00am at [facility]. Patient understands discharge instructions? Yes Patient understands medications and regimen? Yes Patient understands to bring all medications to this visit? Yes

## 2017-04-14 ENCOUNTER — Encounter: Payer: Self-pay | Admitting: *Deleted

## 2017-04-14 DIAGNOSIS — H43812 Vitreous degeneration, left eye: Secondary | ICD-10-CM | POA: Diagnosis not present

## 2017-04-22 ENCOUNTER — Other Ambulatory Visit: Payer: Self-pay | Admitting: *Deleted

## 2017-04-22 ENCOUNTER — Ambulatory Visit (HOSPITAL_COMMUNITY)
Admission: RE | Admit: 2017-04-22 | Discharge: 2017-04-22 | Disposition: A | Discharge status: Home / Self Care | Payer: Medicare Other | Source: Ambulatory Visit | Attending: Cardiovascular Disease | Admitting: Cardiovascular Disease

## 2017-04-22 ENCOUNTER — Encounter: Payer: Self-pay | Admitting: Physician Assistant

## 2017-04-22 ENCOUNTER — Encounter (HOSPITAL_COMMUNITY): Payer: Self-pay

## 2017-04-22 ENCOUNTER — Ambulatory Visit (INDEPENDENT_AMBULATORY_CARE_PROVIDER_SITE_OTHER): Payer: Medicare Other | Admitting: Physician Assistant

## 2017-04-22 VITALS — BP 163/91 | HR 63 | Ht 62.0 in | Wt 229.0 lb

## 2017-04-22 DIAGNOSIS — I721 Aneurysm of artery of upper extremity: Secondary | ICD-10-CM | POA: Diagnosis not present

## 2017-04-22 DIAGNOSIS — E785 Hyperlipidemia, unspecified: Secondary | ICD-10-CM

## 2017-04-22 DIAGNOSIS — E039 Hypothyroidism, unspecified: Secondary | ICD-10-CM | POA: Diagnosis not present

## 2017-04-22 DIAGNOSIS — Z955 Presence of coronary angioplasty implant and graft: Secondary | ICD-10-CM | POA: Diagnosis not present

## 2017-04-22 DIAGNOSIS — R413 Other amnesia: Secondary | ICD-10-CM

## 2017-04-22 DIAGNOSIS — I2510 Atherosclerotic heart disease of native coronary artery without angina pectoris: Secondary | ICD-10-CM | POA: Diagnosis not present

## 2017-04-22 DIAGNOSIS — I10 Essential (primary) hypertension: Secondary | ICD-10-CM | POA: Diagnosis not present

## 2017-04-22 MED ORDER — HYDROCODONE-ACETAMINOPHEN 5-325 MG PO TABS: 1.0000 | ORAL_TABLET | Freq: Four times a day (QID) | ORAL | 0 refills | Status: DC | PRN

## 2017-04-22 MED ORDER — AMLODIPINE BESYLATE 10 MG PO TABS: 10.0000 mg | ORAL_TABLET | Freq: Every day | ORAL | 11 refills | Status: DC

## 2017-04-22 NOTE — Patient Instructions (Addendum)
Medication Instructions:  Continue current medications  If you need a refill on your cardiac medications before your next appointment, please call your pharmacy.  Labwork: None Ordered   You may go to any [facility] lab that is convenient for you however, we do have a lab in our office that is able to assist you. You do NOT need an appointment for our lab. Once in our office lobby there is a podium to the right of the check-in desk where you are to sign-in and ring a doorbell to alert [place] you are here. Lab is open Monday-Friday from 8:00am to 4:00pm; and is closed for lunch from 12:45p-1:45pm   Testing/Procedures: Your physician has requested that you have a upper extremity arterial duplex. This test is an ultrasound of the arteries in the arms. It looks at arterial blood flow in the arms. Allow one hour for Upper Arterial scans. There are no restrictions or special instructions  Your physician has requested that you have an MRI.  Special Instructions:  You have an appointment for out patient surgery Tuesday January 8th, please report to [facility] at 11:00 am, do not eat anything after midnight, you can take your morning medications with a sip of water.   Thank you for choosing [facility] at [facility]!!

## 2017-04-22 NOTE — Progress Notes (Signed)
Call to patient to be at [facility] department at 9:30 am on 04/26/2017 for surgery. No food No drink past MN night prior and to stay on Plavix and Aspirin. Follow the detailed instructions she receives from the hospital pre-admission testing department about this surgery. Verbalized understanding. Must have driver home.

## 2017-04-22 NOTE — Progress Notes (Signed)
Cardiology Office Note    Date:  04/24/2017   ID:  Valerie Ramirez, DOB 07-19-1932, MRN [id number]  PCP:  Abner Greenspan, MD  Cardiologist:  Dr. Martinique  Chief Complaint  Patient presents with  . Follow-up    recent cath, arm sore from cath site, patient thinks something may be broken    History of Present Illness:  Valerie Ramirez is a 84 y.o. female with PMH of HTN, HLD, hypothyroidism presented with DOE and CP.  Echocardiogram obtained on 04/05/2017 showed EF 55-60%, grade 2 DD, PA peak pressure 34 mmHg.  She was ruled in for NSTEMI.  She eventually underwent cardiac catheterization on 04/06/2017 which showed a 95% mid left circumflex lesion treated with 3.5 x 28 mm DES, there was a 40% ostial to proximal LAD residual and to 60% proximal RCA residual.  Post procedure, patient was placed on dual antiplatelet therapy.  Due to her intolerance of Lipitor and simvastatin, she was started on low-dose Crestor.  She also needed outpatient lipid clinic referral due to LDL of 140 and total cholesterol was of 222.  Patient presents today for cardiology office visit.  She continued to have right arm pain after discharge, on physical exam there is a egg sized pulsatile mass by the right radial artery that is tender to touch and has bruit on physical exam as well.  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  She has numbness of the right thumb however not involving the other 4 fingers. This has been seen by Dr. Gwenlyn Found DOD as well, who felt this is semi-urgent and numbness is due to compression of radial nerve. Since cardiac catheterization occurred 2 weeks ago, I doubt it will go away by itself. Dr. Gwenlyn Found recommended urgent vascular consultation. Otherwise she has not had any further chest pain.  We discussed the need to be compliant with aspirin and Plavix.  I will see her back in 3 weeks for follow-up.  Due to severe pain, I cleared with DOD Dr. Gwenlyn Found, I  did prescribe her 5 tablets of Norco.  Her daughter was also concerned about memory loss which is significantly noticeable after recent cardiac catheterization, after discussing with Dr. Gwenlyn Found, I recommended MRI of the brain to rule out recent stroke.   I have discussed the case with Dr. Donzetta Matters of vascular surgery.  Two options include sending the patient to the hospital today versus have the patient seen by vascular surgery next Tuesday, patient really does not wish to go back to the ED again, therefore she opted to be seen by vascular surgery next Tuesday morning.  Our staff will arrange.   Past Medical History:  Diagnosis Date  . Arthritis    OA  . Cancer ([place])    endometrial  . GERD (gastroesophageal reflux disease)   . Hyperlipidemia   . Hypertension   . Hypothyroid   . Kidney stone   . Shortness of breath dyspnea     Past Surgical History:  Procedure Laterality Date  . ANTERIOR CERVICAL DECOMP/DISCECTOMY FUSION [name] 08/18/2015   Procedure: C5-6, C6-7 Anterior Cervical Discectomy and Fusion, Allograft, Plate;  Surgeon: Marybelle Killings, MD;  Location: [facility];  Service: Orthopedics;  Laterality: [name];  . bone spur removed Right    shoulder  . BOWEL RESECTION  07/04/2012   Procedure: SMALL BOWEL RESECTION;  Surgeon: Alvino Chapel, MD;  Location: [facility];  Service: Gynecology;;  . CHOLECYSTECTOMY    .  CORONARY STENT INTERVENTION [name] 04/06/2017   Procedure: CORONARY STENT INTERVENTION;  Surgeon: Wellington Hampshire, MD;  Location: [facility];  Service: Cardiovascular;  Laterality: [name];  . DILATION AND CURETTAGE OF UTERUS  1999  . EYE SURGERY Bilateral    cataracts  . HAND SURGERY  12/2008   after hand fx/due to fall  . JOINT REPLACEMENT Bilateral 07/1998   knees  . KNEE ARTHROPLASTY  05/23/1998   total right  . LAPAROTOMY Bilateral 07/04/2012   Procedure: EXPLORATORY LAPAROTOMY TOTAL ABDOMINAL HYSTERECTOMY BILATERAL SALPINGO-OOPHORECTOMY with small bowel resection ;   Surgeon: Alvino Chapel, MD;  Location: [facility];  Service: Gynecology;  Laterality: Bilateral;  . LEFT HEART CATH AND CORONARY ANGIOGRAPHY [name] 04/06/2017   Procedure: LEFT HEART CATH AND CORONARY ANGIOGRAPHY;  Surgeon: Jolaine Artist, MD;  Location: [facility];  Service: Cardiovascular;  Laterality: [name];  . polyp removal  1999  . TUBAL LIGATION      Current Medications: Outpatient Medications Prior to Visit  Medication Sig Dispense Refill  . acetaminophen (TYLENOL) 650 MG CR tablet Take 650 mg by mouth every 8 (eight) hours as needed for pain.    [name] aspirin 81 MG chewable tablet Chew 1 tablet (81 mg total) by mouth daily.    . carvedilol (COREG) 25 MG tablet Take 1 tablet (25 mg total) by mouth 2 (two) times daily with a meal. 60 tablet 11  . clopidogrel (PLAVIX) 75 MG tablet Take 1 tablet (75 mg total) by mouth daily with breakfast. 30 tablet 11  . colchicine 0.6 MG tablet TAKE ONE TABLET TWO TIMES A DAY WITH FOOD AS NEEDED GOUT 30 tablet 5  . cyclobenzaprine (FLEXERIL) 10 MG tablet Take 10 mg by mouth 3 (three) times daily as needed for muscle spasms.    [name] doxazosin (CARDURA) 2 MG tablet TAKE 1 TABLET (8 MG TOTAL) BY MOUTH AT BEDTIME. 30 tablet 0  . DULoxetine (CYMBALTA) 20 MG capsule TAKE 1 CAPSULE (20 MG TOTAL) BY MOUTH DAILY. 90 capsule 3  . ECHINACEA PO Take 760 mg by mouth 2 (two) times daily.    [name] loperamide (IMODIUM) 2 MG capsule Take 1 capsule (2 mg total) by mouth as needed for diarrhea or loose stools. 20 capsule 0  . losartan (COZAAR) 25 MG tablet Take 1 tablet (25 mg total) by mouth daily. 30 tablet 11  . naproxen sodium (ALEVE) 220 MG tablet Take 220 mg by mouth daily as needed (back & muscle pain).    . nitroGLYCERIN (NITROSTAT) 0.4 MG SL tablet Place 1 tablet (0.4 mg total) under the tongue every 5 (five) minutes x 3 doses as needed for chest pain. 25 tablet 12  . rosuvastatin (CRESTOR) 10 MG tablet Take 1 tablet (10 mg total) by mouth daily at 6 PM. 30  tablet 11  . vitamin B-12 (CYANOCOBALAMIN) 1000 MCG tablet Take 1,000 mcg by mouth daily.    [name] amLODipine (NORVASC) 10 MG tablet Take 1 tablet (10 mg total) by mouth daily. 30 tablet 2  . HYDROcodone-acetaminophen (NORCO/VICODIN) 5-325 MG tablet Take 1-2 tablets by mouth every 6 (six) hours as needed. 20 tablet 0   No facility-administered medications prior to visit.      Allergies:   Allopurinol; Lipitor [atorvastatin calcium]; Simvastatin; and Tape   Social History   Socioeconomic History  . Marital status: Married    Spouse name: None  . Number of children: None  . Years of education: None  . Highest education  level: None  Social Needs  . Financial resource strain: None  . Food insecurity - worry: None  . Food insecurity - inability: None  . Transportation needs - medical: None  . Transportation needs - non-medical: None  Occupational History  . None  Tobacco Use  . Smoking status: Never Smoker  . Smokeless tobacco: Never Used  Substance and Sexual Activity  . Alcohol use: No    Alcohol/week: 0.0 oz  . Drug use: No  . Sexual activity: No  Other Topics Concern  . None  Social History Narrative  . None     Family History:  The patient's family history includes Cancer in her brother, brother, and brother; Heart disease in her brother and sister; Stroke in her sister.   ROS:   Please see the history of present illness.    ROS All other systems reviewed and are negative.   PHYSICAL EXAM:   VS:  BP (!) 163/91   Pulse 63   Ht 5\' 2"  (1.575 m)   Wt 229 lb (103.9 kg)   BMI 41.88 kg/m    GEN: Well nourished, well developed, in no acute distress  HEENT: normal  Neck: no JVD, carotid bruits, or masses Cardiac: RRR; no murmurs, rubs, or gallops,no edema  Respiratory:  clear to auscultation bilaterally, normal work of breathing GI: soft, nontender, nondistended, + BS MS: no deformity or atrophy  Skin: warm and dry, no rash   +2-3cm diameter mass on R wrist, pulsatile,  with bruit Neuro:  Alert and Oriented x 3, Strength and sensation are intact Psych: euthymic mood, full affect  Wt Readings from Last 3 Encounters:  04/22/17 229 lb (103.9 kg)  04/09/17 231 lb 4.8 oz (104.9 kg)  09/15/16 230 lb 8 oz (104.6 kg)      Studies/Labs Reviewed:   EKG:  EKG is not ordered today.    Recent Labs: 04/29/2016: Magnesium 2.1 04/05/2017: TSH 4.855 04/06/2017: ALT 13 04/07/2017: BUN 23; Creatinine, Ser 1.24; Hemoglobin 10.3; Platelets 169; Potassium 4.5; Sodium 138   Lipid Panel    Component Value Date/Time   CHOL 222 (H) 04/06/2017 0301   TRIG 165 (H) 04/06/2017 0301   HDL 49 04/06/2017 0301   CHOLHDL 4.5 04/06/2017 0301   VLDL 33 04/06/2017 0301   LDLCALC 140 (H) 04/06/2017 0301   LDLDIRECT 136.0 10/31/2015 0959    Additional studies/ records that were reviewed today include:   Cath 04/06/2017 Conclusion     Mid Cx lesion is 95% stenosed.  A drug-eluting stent was successfully placed using a STENT SVELTE RX 3.50 X 28MM.  Post intervention, there is a 0% residual stenosis.   Successful angioplasty and drug-eluting stent placement to the mid left circumflex.    Echo 04/05/2017 LV EF: 55% -   60%  ------------------------------------------------------------------- Indications:      Chest pain 786.51.  ------------------------------------------------------------------- History:   PMH:   Angina pectoris.  Risk factors:  Morbidly obese. Dyslipidemia.  ------------------------------------------------------------------- Study Conclusions  - Left ventricle: The cavity size was normal. Wall thickness was   increased in a pattern of severe LVH. Systolic function was   normal. The estimated ejection fraction was in the range of 55%   to 60%. Wall motion was normal; there were no regional wall   motion abnormalities. Doppler parameters are consistent with   pseudonormal left ventricular relaxation (grade 2 diastolic   dysfunction). The  E/e&' ratio is >15, suggesting elevated LV   filling pressure. - Aortic valve: Sclerosis without  stenosis. Transvalvular velocity   was minimally increased. Mean gradient (S): 7 mm Hg. - Mitral valve: Mildly thickened leaflets . There was trivial   regurgitation. - Left atrium: Moderately dilated. - Tricuspid valve: There was mild regurgitation. - Pulmonary arteries: PA peak pressure: 34 mm Hg (S). - Inferior vena cava: The vessel was normal in size. The   respirophasic diameter changes were in the normal range (>= 50%),   consistent with normal central venous pressure.  Impressions:  - LVEF 55-60%, severe LVH, normal wall motion, grade 2 DD with   elevated LV filling pressure, aortic valve sclerosis, trivial MR,   moderate LAE, mild TR, RVSP 34 mmHg, normal IVC.  ASSESSMENT:    1. Pseudoaneurysm of artery of upper extremity ([place])   2. Memory loss   3. Coronary artery disease involving native coronary artery of native heart without angina pectoris   4. Essential hypertension   5. Hyperlipidemia, unspecified hyperlipidemia type   6. Hypothyroidism, unspecified type      PLAN:  In order of problems listed above:  1. Right wrist pseudoaneurysm: Occurred after cardiac catheterization 2 weeks ago.  Egg sized, this was evaluated by DOD Dr. Gwenlyn Found as well.  I will obtain a right upper extremity arterial Doppler this afternoon.  I have discussed the case with Dr. Donzetta Matters of vascular surgery as well, we gave the patient 2 options, either going to the ED today versus early vascular evaluation next Tuesday, patient really does not wish to go back to the ED, therefore she opted to go be evaluated by vascular surgery next Tuesday morning.  She understands if there is any significant change between now and then, she will go to the emergency room.  Otherwise I have given her 5 tablets of Norco for pain after clearing with my attending.  2. Memory issues: According to the daughter, this is more  noticeable after cardiac catheterization 2 weeks ago, she actually has some vision difficulties as well on the left side.  I have obtained an MRI of the brain to rule out potential stroke.  However MRI would not be very urgent as symptoms occurred 2 weeks ago.  3. CAD: Recently underwent PCI of left circumflex artery.  Continue on aspirin and Plavix.  4. Hypertension: Continue carvedilol, losartan and amlodipine.  Blood pressure elevated today, likely in the setting of pain.  5. Hyperlipidemia: Continue Crestor 10 mg daily.  6. Hypothyroidism: Managed by primary care provider.    Medication Adjustments/Labs and Tests Ordered: Current medicines are reviewed at length with the patient today.  Concerns regarding medicines are outlined above.  Medication changes, Labs and Tests ordered today are listed in the Patient Instructions below. Patient Instructions  Medication Instructions:  Continue current medications  If you need a refill on your cardiac medications before your next appointment, please call your pharmacy.  Labwork: None Ordered   You may go to any [facility] lab that is convenient for you however, we do have a lab in our office that is able to assist you. You do NOT need an appointment for our lab. Once in our office lobby there is a podium to the right of the check-in desk where you are to sign-in and ring a doorbell to alert [place] you are here. Lab is open Monday-Friday from 8:00am to 4:00pm; and is closed for lunch from 12:45p-1:45pm   Testing/Procedures: Your physician has requested that you have a upper extremity arterial duplex. This test is an ultrasound of the arteries in  the arms. It looks at arterial blood flow in the arms. Allow one hour for Upper Arterial scans. There are no restrictions or special instructions  Your physician has requested that you have an MRI.  Special Instructions:  You have an appointment for out patient surgery Tuesday January 8th, please report to  [facility] at 11:00 am, do not eat anything after midnight, you can take your morning medications with a sip of water.   Thank you for choosing [facility] at [employer], [place]  04/24/2017 9:51 AM    [address], [place], [place]  [address] Phone: [phone]; Fax: [phone]

## 2017-04-22 NOTE — Progress Notes (Unsigned)
Today's right upper extremity ultrasound is positive for pseudoaneurysm. Results given to [facility].

## 2017-04-24 ENCOUNTER — Encounter: Payer: Self-pay | Admitting: Physician Assistant

## 2017-04-25 ENCOUNTER — Other Ambulatory Visit: Payer: Self-pay

## 2017-04-25 ENCOUNTER — Telehealth: Payer: Self-pay | Admitting: *Deleted

## 2017-04-25 ENCOUNTER — Encounter (HOSPITAL_COMMUNITY): Payer: Self-pay | Admitting: *Deleted

## 2017-04-25 NOTE — Progress Notes (Signed)
Valerie Ramirez states that she no longer has chest pressure or shortness of breath, just arm pain.

## 2017-04-25 NOTE — Telephone Encounter (Signed)
Surgery timed changed per Dr. Bridgett Larsson. Patient called and instructed to be at [facility] admitting at 6 am on 04/26/17. Verbalized understanding.

## 2017-04-25 NOTE — Progress Notes (Signed)
Pseudoaneurysm, pending surgical repair 04/26/2017

## 2017-04-26 ENCOUNTER — Encounter (HOSPITAL_COMMUNITY): Payer: Self-pay

## 2017-04-26 ENCOUNTER — Encounter (HOSPITAL_COMMUNITY)
Admission: RE | Disposition: A | Discharge status: Home / Self Care | Payer: Self-pay | Source: Ambulatory Visit | Attending: Vascular Surgery

## 2017-04-26 ENCOUNTER — Ambulatory Visit (HOSPITAL_COMMUNITY)
Admission: RE | Admit: 2017-04-26 | Discharge: 2017-04-26 | Disposition: A | Discharge status: Home / Self Care | Payer: Medicare Other | Source: Ambulatory Visit | Attending: Vascular Surgery | Admitting: Vascular Surgery

## 2017-04-26 ENCOUNTER — Ambulatory Visit (HOSPITAL_COMMUNITY): Payer: Medicare Other | Admitting: Anesthesiology

## 2017-04-26 ENCOUNTER — Telehealth: Payer: Self-pay | Admitting: Vascular Surgery

## 2017-04-26 DIAGNOSIS — N289 Disorder of kidney and ureter, unspecified: Secondary | ICD-10-CM | POA: Insufficient documentation

## 2017-04-26 DIAGNOSIS — Z96653 Presence of artificial knee joint, bilateral: Secondary | ICD-10-CM | POA: Insufficient documentation

## 2017-04-26 DIAGNOSIS — E7800 Pure hypercholesterolemia, unspecified: Secondary | ICD-10-CM | POA: Diagnosis not present

## 2017-04-26 DIAGNOSIS — E039 Hypothyroidism, unspecified: Secondary | ICD-10-CM | POA: Diagnosis not present

## 2017-04-26 DIAGNOSIS — Z955 Presence of coronary angioplasty implant and graft: Secondary | ICD-10-CM | POA: Insufficient documentation

## 2017-04-26 DIAGNOSIS — I2510 Atherosclerotic heart disease of native coronary artery without angina pectoris: Secondary | ICD-10-CM | POA: Diagnosis not present

## 2017-04-26 DIAGNOSIS — Z7982 Long term (current) use of aspirin: Secondary | ICD-10-CM | POA: Diagnosis not present

## 2017-04-26 DIAGNOSIS — E785 Hyperlipidemia, unspecified: Secondary | ICD-10-CM | POA: Insufficient documentation

## 2017-04-26 DIAGNOSIS — Z6841 Body Mass Index (BMI) 40.0 and over, adult: Secondary | ICD-10-CM | POA: Insufficient documentation

## 2017-04-26 DIAGNOSIS — Z7902 Long term (current) use of antithrombotics/antiplatelets: Secondary | ICD-10-CM | POA: Insufficient documentation

## 2017-04-26 DIAGNOSIS — I10 Essential (primary) hypertension: Secondary | ICD-10-CM | POA: Diagnosis not present

## 2017-04-26 DIAGNOSIS — Z79899 Other long term (current) drug therapy: Secondary | ICD-10-CM | POA: Diagnosis not present

## 2017-04-26 DIAGNOSIS — Z791 Long term (current) use of non-steroidal anti-inflammatories (NSAID): Secondary | ICD-10-CM | POA: Insufficient documentation

## 2017-04-26 DIAGNOSIS — Z8542 Personal history of malignant neoplasm of other parts of uterus: Secondary | ICD-10-CM | POA: Insufficient documentation

## 2017-04-26 DIAGNOSIS — I721 Aneurysm of artery of upper extremity: Secondary | ICD-10-CM | POA: Diagnosis not present

## 2017-04-26 HISTORY — PX: FALSE ANEURYSM REPAIR: SHX5152

## 2017-04-26 HISTORY — DX: Unspecified hearing loss, unspecified ear: H91.90

## 2017-04-26 HISTORY — DX: Personal history of urinary calculi: Z87.442

## 2017-04-26 LAB — COMPREHENSIVE METABOLIC PANEL
ALT: 24 U/L (ref 14–54)
AST: 32 U/L (ref 15–41)
Albumin: 3.5 g/dL (ref 3.5–5.0)
Alkaline Phosphatase: 104 U/L (ref 38–126)
Anion gap: 10 (ref 5–15)
BUN: 24 mg/dL — ABNORMAL HIGH (ref 6–20)
CO2: 20 mmol/L — ABNORMAL LOW (ref 22–32)
Calcium: 9 mg/dL (ref 8.9–10.3)
Chloride: 107 mmol/L (ref 101–111)
Creatinine, Ser: 1.29 mg/dL — ABNORMAL HIGH (ref 0.44–1.00)
GFR calc Af Amer: 43 mL/min — ABNORMAL LOW (ref >60)
GFR calc non Af Amer: 37 mL/min — ABNORMAL LOW (ref >60)
Glucose, Bld: 127 mg/dL — ABNORMAL HIGH (ref 65–99)
Potassium: 3.8 mmol/L (ref 3.5–5.1)
Sodium: 137 mmol/L (ref 135–145)
Total Bilirubin: 0.8 mg/dL (ref 0.3–1.2)
Total Protein: 6.5 g/dL (ref 6.5–8.1)

## 2017-04-26 LAB — CBC
HCT: 35.6 % — ABNORMAL LOW (ref 36.0–46.0)
Hemoglobin: 10.6 g/dL — ABNORMAL LOW (ref 12.0–15.0)
MCH: 25.5 pg — ABNORMAL LOW (ref 26.0–34.0)
MCHC: 29.8 g/dL — ABNORMAL LOW (ref 30.0–36.0)
MCV: 85.8 fL (ref 78.0–100.0)
Platelets: 196 10*3/uL (ref 150–400)
RBC: 4.15 MIL/uL (ref 3.87–5.11)
RDW: 16.8 % — ABNORMAL HIGH (ref 11.5–15.5)
WBC: 6.9 10*3/uL (ref 4.0–10.5)

## 2017-04-26 SURGERY — REPAIR, PSEUDOANEURYSM
Anesthesia: General | Laterality: Right

## 2017-04-26 MED ORDER — EPHEDRINE 5 MG/ML INJ
INTRAVENOUS | Status: AC
  Filled 2017-04-26: qty 10

## 2017-04-26 MED ORDER — HEPARIN SODIUM (PORCINE) 5000 UNIT/ML IJ SOLN
INTRAMUSCULAR | Status: DC | PRN
  Administered 2017-04-26: 500 mL

## 2017-04-26 MED ORDER — ONDANSETRON HCL 4 MG/2ML IJ SOLN
INTRAMUSCULAR | Status: DC | PRN
  Administered 2017-04-26: 4 mg via INTRAVENOUS

## 2017-04-26 MED ORDER — DEXTROSE 5 % IV SOLN
INTRAVENOUS | Status: DC | PRN
  Administered 2017-04-26: 20 ug/min via INTRAVENOUS

## 2017-04-26 MED ORDER — 0.9 % SODIUM CHLORIDE (POUR BTL) OPTIME
TOPICAL | Status: DC | PRN
  Administered 2017-04-26: 1000 mL

## 2017-04-26 MED ORDER — DEXTROSE 5 % IV SOLN
1.5000 g | INTRAVENOUS | Status: AC
  Administered 2017-04-26: 1.5 g via INTRAVENOUS
  Filled 2017-04-26: qty 1.5

## 2017-04-26 MED ORDER — HEMOSTATIC AGENTS (NO CHARGE) OPTIME
TOPICAL | Status: DC | PRN
  Administered 2017-04-26: 1 via TOPICAL

## 2017-04-26 MED ORDER — PROPOFOL 10 MG/ML IV BOLUS
INTRAVENOUS | Status: AC
  Filled 2017-04-26: qty 20

## 2017-04-26 MED ORDER — DEXAMETHASONE SODIUM PHOSPHATE 10 MG/ML IJ SOLN
INTRAMUSCULAR | Status: DC | PRN
  Administered 2017-04-26: 10 mg via INTRAVENOUS

## 2017-04-26 MED ORDER — TRAMADOL HCL 50 MG PO TABS: 50.0000 mg | ORAL_TABLET | Freq: Four times a day (QID) | ORAL | 0 refills | Status: DC | PRN

## 2017-04-26 MED ORDER — SODIUM CHLORIDE 0.9 % IV SOLN: INTRAVENOUS | Status: DC

## 2017-04-26 MED ORDER — HEPARIN SODIUM (PORCINE) 1000 UNIT/ML IJ SOLN
INTRAMUSCULAR | Status: DC | PRN
  Administered 2017-04-26: 10000 [IU] via INTRAVENOUS

## 2017-04-26 MED ORDER — FENTANYL CITRATE (PF) 100 MCG/2ML IJ SOLN
INTRAMUSCULAR | Status: DC | PRN
  Administered 2017-04-26: 50 ug via INTRAVENOUS
  Administered 2017-04-26: 100 ug via INTRAVENOUS
  Administered 2017-04-26: 50 ug via INTRAVENOUS

## 2017-04-26 MED ORDER — LIDOCAINE 2% (20 MG/ML) 5 ML SYRINGE
INTRAMUSCULAR | Status: AC
  Filled 2017-04-26: qty 5

## 2017-04-26 MED ORDER — DEXAMETHASONE SODIUM PHOSPHATE 10 MG/ML IJ SOLN
INTRAMUSCULAR | Status: AC
  Filled 2017-04-26: qty 1

## 2017-04-26 MED ORDER — LIDOCAINE HCL (CARDIAC) 20 MG/ML IV SOLN
INTRAVENOUS | Status: DC | PRN
  Administered 2017-04-26: 100 mg via INTRAVENOUS

## 2017-04-26 MED ORDER — PROPOFOL 10 MG/ML IV BOLUS
INTRAVENOUS | Status: DC | PRN
  Administered 2017-04-26: 110 mg via INTRAVENOUS

## 2017-04-26 MED ORDER — FENTANYL CITRATE (PF) 250 MCG/5ML IJ SOLN
INTRAMUSCULAR | Status: AC
  Filled 2017-04-26: qty 5

## 2017-04-26 MED ORDER — EPHEDRINE SULFATE 50 MG/ML IJ SOLN
INTRAMUSCULAR | Status: DC | PRN
  Administered 2017-04-26: 15 mg via INTRAVENOUS
  Administered 2017-04-26: 10 mg via INTRAVENOUS

## 2017-04-26 MED ORDER — GLYCOPYRROLATE 0.2 MG/ML IJ SOLN
INTRAMUSCULAR | Status: DC | PRN
  Administered 2017-04-26: 0.2 mg via INTRAVENOUS

## 2017-04-26 MED ORDER — LACTATED RINGERS IV SOLN
INTRAVENOUS | Status: DC
  Administered 2017-04-26: 07:00:00 via INTRAVENOUS

## 2017-04-26 MED ORDER — PROTAMINE SULFATE 10 MG/ML IV SOLN
INTRAVENOUS | Status: DC | PRN
  Administered 2017-04-26: 60 mg via INTRAVENOUS

## 2017-04-26 MED ORDER — ONDANSETRON HCL 4 MG/2ML IJ SOLN
INTRAMUSCULAR | Status: AC
  Filled 2017-04-26: qty 2

## 2017-04-26 SURGICAL SUPPLY — 51 items
ADH SKN CLS APL DERMABOND .7 (GAUZE/BANDAGES/DRESSINGS) ×1
AGENT HMST SPONGE THK3/8 (HEMOSTASIS) ×1
BANDAGE ACE 4X5 VEL STRL LF (GAUZE/BANDAGES/DRESSINGS) IMPLANT
BANDAGE ESMARK 6X9 LF (GAUZE/BANDAGES/DRESSINGS) IMPLANT
BNDG CMPR 9X6 STRL LF SNTH (GAUZE/BANDAGES/DRESSINGS) ×1
BNDG ESMARK 6X9 LF (GAUZE/BANDAGES/DRESSINGS) ×2
CANISTER SUCT 3000ML PPV (MISCELLANEOUS) ×2 IMPLANT
CLIP VESOCCLUDE MED 24/CT (CLIP) ×2 IMPLANT
CLIP VESOCCLUDE SM WIDE 24/CT (CLIP) ×2 IMPLANT
COVER PROBE W GEL 5X96 (DRAPES) ×1 IMPLANT
CUFF TOURNIQUET SINGLE 18IN (TOURNIQUET CUFF) ×1 IMPLANT
CUFF TOURNIQUET SINGLE 24IN (TOURNIQUET CUFF) IMPLANT
CUFF TOURNIQUET SINGLE 34IN LL (TOURNIQUET CUFF) IMPLANT
CUFF TOURNIQUET SINGLE 44IN (TOURNIQUET CUFF) IMPLANT
DERMABOND ADVANCED (GAUZE/BANDAGES/DRESSINGS) ×1
DERMABOND ADVANCED .7 DNX12 (GAUZE/BANDAGES/DRESSINGS) ×1 IMPLANT
DRAIN CHANNEL 15F RND FF W/TCR (WOUND CARE) IMPLANT
ELECT REM PT RETURN 9FT ADLT (ELECTROSURGICAL) ×2
ELECTRODE REM PT RTRN 9FT ADLT (ELECTROSURGICAL) ×1 IMPLANT
EVACUATOR SILICONE 100CC (DRAIN) IMPLANT
GAUZE SPONGE 4X4 16PLY XRAY LF (GAUZE/BANDAGES/DRESSINGS) ×1 IMPLANT
GLOVE BIO SURGEON STRL SZ7 (GLOVE) ×3 IMPLANT
GLOVE BIOGEL PI IND STRL 6.5 (GLOVE) IMPLANT
GLOVE BIOGEL PI IND STRL 7.5 (GLOVE) ×1 IMPLANT
GLOVE BIOGEL PI INDICATOR 6.5 (GLOVE) ×1
GLOVE BIOGEL PI INDICATOR 7.5 (GLOVE) ×1
GLOVE SURG SS PI 6.5 STRL IVOR (GLOVE) ×1 IMPLANT
GOWN STRL NON-REIN LRG LVL3 (GOWN DISPOSABLE) ×1 IMPLANT
GOWN STRL REUS W/ TWL LRG LVL3 (GOWN DISPOSABLE) ×3 IMPLANT
GOWN STRL REUS W/TWL LRG LVL3 (GOWN DISPOSABLE) ×6
HEMOSTAT SPONGE AVITENE ULTRA (HEMOSTASIS) ×1 IMPLANT
KIT BASIN OR (CUSTOM PROCEDURE TRAY) ×2 IMPLANT
KIT ROOM TURNOVER OR (KITS) ×2 IMPLANT
NS IRRIG 1000ML POUR BTL (IV SOLUTION) ×4 IMPLANT
PACK CV ACCESS (CUSTOM PROCEDURE TRAY) ×1 IMPLANT
PACK PERIPHERAL VASCULAR (CUSTOM PROCEDURE TRAY) ×2 IMPLANT
PAD ARMBOARD 7.5X6 YLW CONV (MISCELLANEOUS) ×4 IMPLANT
STAPLER VISISTAT 35W (STAPLE) IMPLANT
SUT MNCRL AB 4-0 PS2 18 (SUTURE) ×2 IMPLANT
SUT PROLENE 5 0 C 1 24 (SUTURE) ×2 IMPLANT
SUT PROLENE 6 0 BV (SUTURE) ×3 IMPLANT
SUT PROLENE 6 0 CC (SUTURE) ×1 IMPLANT
SUT PROLENE 7 0 BV 1 (SUTURE) ×1 IMPLANT
SUT VIC AB 2-0 CT1 27 (SUTURE) ×2
SUT VIC AB 2-0 CT1 TAPERPNT 27 (SUTURE) ×1 IMPLANT
SUT VIC AB 3-0 SH 27 (SUTURE) ×2
SUT VIC AB 3-0 SH 27X BRD (SUTURE) ×1 IMPLANT
TOWEL GREEN STERILE (TOWEL DISPOSABLE) ×2 IMPLANT
TRAY FOLEY W/METER SILVER 16FR (SET/KITS/TRAYS/PACK) ×2 IMPLANT
UNDERPAD 30X30 (UNDERPADS AND DIAPERS) ×2 IMPLANT
WATER STERILE IRR 1000ML POUR (IV SOLUTION) ×2 IMPLANT

## 2017-04-26 NOTE — Anesthesia Postprocedure Evaluation (Signed)
Anesthesia Post Note  Patient: [name]  Procedure(s) Performed: 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 (Right )     Patient location during evaluation: PACU Anesthesia Type: General Level of consciousness: sedated Pain management: pain level controlled Vital Signs Assessment: post-procedure vital signs reviewed and stable Respiratory status: spontaneous breathing and respiratory function stable Cardiovascular status: stable Postop Assessment: no apparent nausea or vomiting Anesthetic complications: no    Last Vitals:  Vitals:   04/26/17 1038 04/26/17 1046  BP:  (!) 143/57  Pulse: 76   Resp: 15   Temp: (!) 36.2 C   SpO2: 93% 93%    Last Pain:  Vitals:   04/26/17 1038  TempSrc:   PainSc: 0-No pain                 Rivka Baune DANIEL

## 2017-04-26 NOTE — Transfer of Care (Signed)
Immediate Anesthesia Transfer of Care Note  Patient: Valerie Ramirez  Procedure(s) Performed: 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 (Right )  Patient Location: PACU  Anesthesia Type:General  Level of Consciousness: awake and patient cooperative  Airway & Oxygen Therapy: Patient Spontanous Breathing  Post-op Assessment: Report given to RN and Post -op Vital signs reviewed and stable  Post vital signs: Reviewed and stable  Last Vitals:  Vitals:   04/26/17 0648  BP: (!) 175/62  Pulse: 70  Resp: 18  Temp: 36.6 C  SpO2: 98%    Last Pain:  Vitals:   04/26/17 0715  TempSrc:   PainSc: 7          Complications: No apparent anesthesia complications

## 2017-04-26 NOTE — H&P (Signed)
Requested by:  Dr. Gwenlyn Found (cardiology)  Reason for consultation: right wrist mass    History of Present Illness   Valerie Ramirez is a 84 y.o. ([birth year]) female who presents with cc: enlarge mass in R wrist. Pt underwent PCI on 04/06/17 via a R radial access.  Multiple needle sticks were noted during this intervention.  By 04/22/17 a sizeable pulsatile mass was found on exam during Cardiology follow up.  Patient notes pain with manipulating mass and pain with use of thumb.  She notes some numbness in the right thumb.    Past Medical History:  Diagnosis Date  . Arthritis    OA  . Cancer ([place])    endometrial  . GERD (gastroesophageal reflux disease)   . Hearing loss   . History of kidney stones   . Hyperlipidemia   . Hypertension   . Hypothyroid    "not in years"  . Shortness of breath dyspnea    04/25/17- not anymore    Past Surgical History:  Procedure Laterality Date  . ABDOMINAL HYSTERECTOMY    . ANTERIOR CERVICAL DECOMP/DISCECTOMY FUSION [name] 08/18/2015   Procedure: C5-6, C6-7 Anterior Cervical Discectomy and Fusion, Allograft, Plate;  Surgeon: Marybelle Killings, MD;  Location: [facility];  Service: Orthopedics;  Laterality: [name];  . bone spur removed Right    shoulder  . BOWEL RESECTION  07/04/2012   Procedure: SMALL BOWEL RESECTION;  Surgeon: Alvino Chapel, MD;  Location: [facility];  Service: Gynecology;;  . CHOLECYSTECTOMY    . CORONARY STENT INTERVENTION [name] 04/06/2017   Procedure: CORONARY STENT INTERVENTION;  Surgeon: Wellington Hampshire, MD;  Location: [facility];  Service: Cardiovascular;  Laterality: [name];  . DILATION AND CURETTAGE OF UTERUS  1999  . EYE SURGERY Bilateral    cataracts  . HAND SURGERY  12/2008   after hand fx/due to fall  . JOINT REPLACEMENT Bilateral 07/1998   knees  . KNEE ARTHROPLASTY  05/23/1998   total right  . LAPAROTOMY Bilateral 07/04/2012   Procedure: EXPLORATORY LAPAROTOMY TOTAL ABDOMINAL HYSTERECTOMY BILATERAL SALPINGO-OOPHORECTOMY  with small bowel resection ;  Surgeon: Alvino Chapel, MD;  Location: [facility];  Service: Gynecology;  Laterality: Bilateral;  . LEFT HEART CATH AND CORONARY ANGIOGRAPHY [name] 04/06/2017   Procedure: LEFT HEART CATH AND CORONARY ANGIOGRAPHY;  Surgeon: Jolaine Artist, MD;  Location: [facility];  Service: Cardiovascular;  Laterality: [name];  . polyp removal  1999  . TUBAL LIGATION       Social History   Socioeconomic History  . Marital status: Married    Spouse name: Not on file  . Number of children: Not on file  . Years of education: Not on file  . Highest education level: Not on file  Social Needs  . Financial resource strain: Not on file  . Food insecurity - worry: Not on file  . Food insecurity - inability: Not on file  . Transportation needs - medical: Not on file  . Transportation needs - non-medical: Not on file  Occupational History  . Not on file  Tobacco Use  . Smoking status: Never Smoker  . Smokeless tobacco: Never Used  Substance and Sexual Activity  . Alcohol use: No    Alcohol/week: 0.0 oz  . Drug use: No  . Sexual activity: No  Other Topics Concern  . Not on file  Social History Narrative  . Not on file    Family History  Problem Relation Age of  Onset  . Cancer Brother        LEUKEMIA  . Cancer Brother        LUNG  . Cancer Brother        STOMACH  . Heart disease Sister        CABG  . Stroke Sister   . Heart disease Brother        CABG    Current Facility-Administered Medications  Medication Dose Route Frequency Provider Last Rate Last Dose  . 0.9 %  sodium chloride infusion   Intravenous Continuous Angelia Mould, MD      . cefUROXime (ZINACEF) 1.5 g in dextrose 5 % 50 mL IVPB  1.5 g Intravenous 30 min Pre-Op Angelia Mould, MD      . lactated ringers infusion   Intravenous Continuous Albertha Ghee, MD        Allergies  Allergen Reactions  . Allopurinol Nausea Only  . Lipitor [Atorvastatin Calcium] Other  (See Comments)    Leg ache and ache all over  . Simvastatin     REACTION: muscle pain  . Tape Itching and Rash    "Cannot use paper tape" --- also causes blisters. Use plastic tape    REVIEW OF SYSTEMS (negative unless checked):   Cardiac:  []  Chest pain or chest pressure? [x]  Shortness of breath upon activity? []  Shortness of breath when lying flat? []  Irregular heart rhythm?  Vascular:  []  Pain in calf, thigh, or hip brought on by walking? []  Pain in feet at night that wakes you up from your sleep? []  Blood clot in your veins? []  Leg swelling?  Pulmonary:  []  Oxygen at home? []  Productive cough? []  Wheezing?  Neurologic:  []  Sudden weakness in arms or legs? [x]  Sudden numbness in arms or legs? []  Sudden onset of difficult speaking or slurred speech? []  Temporary loss of vision in one eye? []  Problems with dizziness?  Gastrointestinal:  []  Blood in stool? []  Vomited blood?  Genitourinary:  []  Burning when urinating? []  Blood in urine?  Psychiatric:  []  Major depression  Hematologic:  []  Bleeding problems? []  Problems with blood clotting?  Dermatologic:  []  Rashes or ulcers?  Constitutional:  []  Fever or chills?  Ear/Nose/Throat:  []  Change in hearing? []  Nose bleeds? []  Sore throat?  Musculoskeletal:  []  Back pain? []  Joint pain? []  Muscle pain?   For VQI Use Only   PRE-ADM LIVING Home  AMB STATUS Ambulatory  CAD Sx Unstable Angina  PRIOR CHF None  STRESS TEST No    Physical Examination     Vitals:   04/26/17 0648  BP: (!) 175/62  Pulse: 70  Resp: 18  Temp: 97.8 F (36.6 C)  TempSrc: Oral  SpO2: 98%  Weight: 233 lb (105.7 kg)  Height: 5\' 2"  (1.575 m)   Body mass index is 42.62 kg/m.  General Alert, O x 3, Obese, NAD  Head [place]/AT,    Ear/Nose/ Throat Hearing grossly intact, nares without erythema or drainage, oropharynx without Erythema or Exudate, Mallampati score: 3,   Eyes PERRLA, EOMI,    Neck Supple, mid-line  trachea,    Pulmonary Sym exp, good B air movt, CTA B  Cardiac RRR, Nl S1, S2, no Murmurs, No rubs, No S3,S4  Vascular Vessel Right Left  Radial Palpable Palpable  Brachial Palpable Palpable  Carotid Palpable, No Bruit Palpable, No Bruit  Aorta Not palpable [name]  Femoral Palpable Palpable  Popliteal Not palpable Not palpable  PT Not palpable  Not palpable  DP Not palpable Not palpable    Gastro- intestinal soft, non-distended, non-tender to palpation, No guarding or rebound, no HSM, no masses, no CVAT B, No palpable prominent aortic pulse,    Musculo- skeletal M/S 5/5 throughout except R hand exam limited by pain, R hand grip stil 4/5, palpable mass overlying radial artery, Extremities without ischemic changes  , No edema present, No visible varicosities , No Lipodermatosclerosis present  Neurologic Cranial nerves 2-12 intact , Pain and light touch intact in extremities , Motor exam as listed above  Psychiatric Judgement intact, Mood & affect appropriate for pt's clinical situation  Dermatologic See M/S exam for extremity exam, No rashes otherwise noted  Lymphatic  Palpable lymph nodes: None    Non-invasive Vascular Imaging     RUE Arterial Duplex (04/22/17)  Pseudoaneurysm measures 1.5 cm x 1.7 cm.   Laboratory   CBC CBC Latest Ref Rng & Units 04/07/2017 04/06/2017 04/06/2017  WBC 4.0 - 10.5 K/uL 10.5 11.6(H) -  Hemoglobin 12.0 - 15.0 g/dL 10.3(L) 11.1(L) 10.9(L)  Hematocrit 36.0 - 46.0 % 34.0(L) 36.3 32.0(L)  Platelets 150 - 400 K/uL 169 184 -    BMP BMP Latest Ref Rng & Units 04/07/2017 04/06/2017 04/06/2017  Glucose 65 - 99 mg/dL 124(H) 152(H) 74  BUN 6 - 20 mg/dL 23(H) 24(H) 24(H)  Creatinine 0.44 - 1.00 mg/dL 1.24(H) 1.26(H) 0.60  Sodium 135 - 145 mmol/L 138 137 119(LL)  Potassium 3.5 - 5.1 mmol/L 4.5 4.4 3.1(L)  Chloride 101 - 111 mmol/L 109 108 82(L)  CO2 22 - 32 mmol/L 20(L) 17(L) -  Calcium 8.9 - 10.3 mg/dL 8.4(L) 8.6(L) -    Coagulation Lab Results    Component Value Date   INR 1.04 04/06/2017   INR 1.05 08/08/2015   No results found for: PTT  Lipids    Component Value Date/Time   CHOL 222 (H) 04/06/2017 0301   TRIG 165 (H) 04/06/2017 0301   HDL 49 04/06/2017 0301   CHOLHDL 4.5 04/06/2017 0301   VLDL 33 04/06/2017 0301   LDLCALC 140 (H) 04/06/2017 0301   LDLDIRECT 136.0 10/31/2015 0959    Medical Decision Making   Valerie Ramirez is a 84 y.o. female who presents with: R radial PSA s/p PCI   Given sx status, would proceed with R wrist exploration and repair of radial PSA. Risk, benefits, and alternatives to proposed surgery were discussed.   The patient is aware the risks include but are not limited to: bleeding, infection, nerve damage, thrombosis, need for additional procedures, death and stroke.   The patient agrees to proceed forward with the procedure.  Thank you for allowing [place] to participate in this patient's care.   Adele Barthel, MD, FACS [facility] Office: [phone] Pager: [phone]  04/26/2017, 7:22 AM

## 2017-04-26 NOTE — Anesthesia Preprocedure Evaluation (Addendum)
Anesthesia Evaluation  Patient identified by MRN, date of birth, ID band Patient awake    Reviewed: Allergy & Precautions, NPO status , Patient's Chart, lab work & pertinent test results  History of Anesthesia Complications Negative for: history of anesthetic complications  Airway Mallampati: I  TM Distance: >3 FB Neck ROM: Full    Dental no notable dental hx. (+) Dental Advisory Given, Edentulous Upper, Edentulous Lower   Pulmonary neg pulmonary ROS,    Pulmonary exam normal        Cardiovascular hypertension, Pt. on home beta blockers + CAD and + Cardiac Stents  Normal cardiovascular exam  Impressions:  - LVEF 55-60%, severe LVH, normal wall motion, grade 2 DD with   elevated LV filling pressure, aortic valve sclerosis, trivial MR,   moderate LAE, mild TR, RVSP 34 mmHg, normal IVC.   Neuro/Psych negative neurological ROS     GI/Hepatic Neg liver ROS, GERD  ,  Endo/Other  Hypothyroidism Morbid obesity  Renal/GU Renal InsufficiencyRenal disease     Musculoskeletal negative musculoskeletal ROS (+)   Abdominal   Peds  Hematology negative hematology ROS (+)   Anesthesia Other Findings Day of surgery medications reviewed with the patient.  Reproductive/Obstetrics                            Anesthesia Physical Anesthesia Plan  ASA: III  Anesthesia Plan: General   Post-op Pain Management:    Induction: Intravenous  PONV Risk Score and Plan: 2 and Ondansetron and Dexamethasone  Airway Management Planned: LMA  Additional Equipment:   Intra-op Plan:   Post-operative Plan: Extubation in OR  Informed Consent: I have reviewed the patients History and Physical, chart, labs and discussed the procedure including the risks, benefits and alternatives for the proposed anesthesia with the patient or authorized representative who has indicated his/her understanding and acceptance.   Dental  advisory given  Plan Discussed with: CRNA, Anesthesiologist and Surgeon  Anesthesia Plan Comments:       Anesthesia Quick Evaluation

## 2017-04-26 NOTE — Telephone Encounter (Signed)
-----   Message from Mena Goes, RN sent at 04/26/2017 11:07 AM EST ----- Regarding: 2 weeks post radial artery repair   ----- Message ----- From: Iline Oven Sent: 04/26/2017  10:12 AM To: [facility] Charge Pool  Can you schedule an appt for this pt with Dr. Bridgett Larsson in about 2 weeks.  PO R open radial pseudoaneurysm repair. Thanks, [facility]

## 2017-04-26 NOTE — Op Note (Signed)
    OPERATIVE NOTE   PROCEDURE: 1. Repair of right radial artery pseudoaneurysm  PRE-OPERATIVE DIAGNOSIS: symptomatic right radial pseudoaneurysm  POST-OPERATIVE DIAGNOSIS: same as above   SURGEON: Adele Barthel, MD  ASSISTANT(S): Dagoberto Ligas, PAC   ANESTHESIA: general  ESTIMATED BLOOD LOSS: 50 cc  FINDING(S): 1.  3 cm x 2 cm x 3 cm pseudoaneurysm on top right radial artery 50-75% filled with chronic thrombus 2.  Palpable right radial pulse at end of case  SPECIMEN(S):  none  INDICATIONS:   Valerie Ramirez is a 84 y.o. 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  Dr. Donzetta Matters recommended repair of the right radial artery pseudoaneurysm.  The patient is aware risk include but are not limited to: bleeding, infection, nerve injury, and need for additional procedures.  DESCRIPTION: After obtaining full informed written consent, the patient was brought back to the operating room and placed supine upon the operating table.  The patient received IV antibiotics prior to induction.  A procedure time out was completed and the correct surgical site was verified.  After obtaining adequate anesthesia, the patient was prepped and draped in the standard fashion for: right wrist exploration.  I marked the location of the radial artery on the right arm and the location of the pseudoaneurysm.  This pseudoaneurysm was significantly larger than noted on prior non-invasive imaging, so I elected to given the patient 10000 units of Heparin.    I placed a sterile tourniquet on the upper arm.  After waiting 3 minutes, I exsanguinated the right arm with an [place] bandage and inflated the tourniquet to 250 mm Hg.  I made an incision over the pseudoaneurysm and using electrocautery, I dissected out the superficial wall of the pseudoaneurysm.  I briefly dropped the tourniquet to verify this was the pseudoaneurysm.  The strong pulse verified this.  I re-exsanguinated the right  arm and then reinflated the tourniquet.  I opened the superficial wall of the pseudoaneurysm.  Immediately chronic thrombus was evident.  I removed all of the thrombus to fully visualized the pseudoaneurysm cavity.  There appeared to be a single bleeding point in the floor of the cavity. I repaired this with two 6-0 Prolene stitches.  Prior to completing this repair of the radial artery, I dropped the tourniquet.  There was pulsatile bleeding from this hole in the floor of the cavity.  I tied the two sutures togetheter and this controlled the bleeding.    I then proceeded to dissect the superficial wall of the cavity and resected this with electrocautery.  I elected to leave the inferior wall to avoid any arterial or nerve injury.  I packed the surgical wound with Avitene.  I gave the patient 60 mg of Protamine.  After waiting a few minutes, I washed out the Avitene and then repaired some bleeding in the the suture line with a 6-0 Prolene.  I repacked the wound with Avitene.  After waiting a few minutes, I removed the Avitene.  There was no further active bleeding.  I reapproximated the subcutaneous tissue with a double layer of 3-0 Vicryl.  The skin was reapproximated with a running subcuticular stitch of 4-0 Monocryl.  The skin was cleaned, dried, and reinforced with Dermabond.   COMPLICATIONS: none  CONDITION: stable   Adele Barthel, MD, [facility] Office: [phone] Pager: [phone]  04/26/2017, 10:01 AM

## 2017-04-26 NOTE — Anesthesia Procedure Notes (Signed)
Procedure Name: LMA Insertion Date/Time: 04/26/2017 8:44 AM Performed by: Lance Coon, CRNA Pre-anesthesia Checklist: Patient identified, Emergency Drugs available, Suction available, Patient being monitored and Timeout performed Patient Re-evaluated:Patient Re-evaluated prior to induction Oxygen Delivery Method: Circle system utilized Preoxygenation: Pre-oxygenation with 100% oxygen Induction Type: IV induction LMA: LMA inserted LMA Size: 4.0 Number of attempts: 1 Placement Confirmation: positive ETCO2 and breath sounds checked- equal and bilateral Tube secured with: Tape Dental Injury: Teeth and Oropharynx as per pre-operative assessment

## 2017-04-26 NOTE — Telephone Encounter (Signed)
spoke to pt's daughter who is also pt's transportation, offered friday 05/20/17, she said she could not transport on fridays, told her blc is moving to wed the following week, resched appt 05/25/17 at 9:45

## 2017-04-27 ENCOUNTER — Encounter (HOSPITAL_COMMUNITY): Payer: Self-pay | Admitting: Vascular Surgery

## 2017-05-02 ENCOUNTER — Other Ambulatory Visit: Payer: Self-pay

## 2017-05-02 ENCOUNTER — Telehealth: Payer: Self-pay | Admitting: Physician Assistant

## 2017-05-02 ENCOUNTER — Ambulatory Visit
Admission: RE | Admit: 2017-05-02 | Discharge: 2017-05-02 | Disposition: A | Discharge status: Home / Self Care | Payer: Medicare Other | Source: Ambulatory Visit | Attending: Physician Assistant | Admitting: Physician Assistant

## 2017-05-02 DIAGNOSIS — I639 Cerebral infarction, unspecified: Secondary | ICD-10-CM

## 2017-05-02 DIAGNOSIS — R413 Other amnesia: Secondary | ICD-10-CM

## 2017-05-02 DIAGNOSIS — R410 Disorientation, unspecified: Secondary | ICD-10-CM | POA: Diagnosis not present

## 2017-05-02 MED ORDER — GADOBENATE DIMEGLUMINE 529 MG/ML IV SOLN
20.0000 mL | Freq: Once | INTRAVENOUS | Status: AC | PRN
  Administered 2017-05-02: 20 mL via INTRAVENOUS

## 2017-05-02 NOTE — Telephone Encounter (Signed)
Was able to get in touch with daughter - she called the number I had previously called from. She affirms patient has been doing better lately without any new/progressive symptoms -> will recommend outpatient neuro evaluation per Dr. Martinique. Malachy Mood, can you help facilitate? Thank you! Reviewed ER precautions with daughter and advised to take patient to [facility] ER for any new slurred speech, weakness, visual changes or any progression of prior symptoms. She verbalized understanding and gratitude.  Nataki Mccrumb PA-C

## 2017-05-02 NOTE — Telephone Encounter (Signed)
Spoke to patient advised scheduler will call back tomorrow with a neurology appointment.

## 2017-05-02 NOTE — Telephone Encounter (Signed)
I am covering for Valerie Deforest PA-C's inbox this afternoon. Valerie Ramirez MRI report came across, result as: IMPRESSION: 1. Punctate acute/early subacute microvascular infarct in right anterior caudate body. No acute hemorrhage. 2. Chronic right occipital infarction. 3. Moderate to severe chronic microvascular ischemic changes and moderate brain parenchymal volume loss. 4. Small chronic lacunar infarct in right putamen extending into mid corona radiata.  I contacted Valerie Ramirez who had also gotten copy of result. Pt was seen by Valerie Ramirez 1/4 for some memory issues and visual changes after cath. In talking with Valerie Ramirez today at the time of that visit symptoms had almost completely resolved. Per discussion with Valerie Ramirez, he feels that if patient is symptomatically stable then she can be referred as outpatient to neurology for stroke (would need to be urgent appt so that it does not get moved out several months). I tried to call patient and her phone just rang incessantly, did not go to voicemail. I tried to call emergency contacts son and daughter with no reply. Son answered on second try, stated that his sister and mom were out and about s/p MRI. He states she has been doing well recently and had no acute concerns. He instructed to try to call daughter several more times in case she was screening calls I tried to call daughter again with no response and left message on machine to call us back ASAP to review symptoms. I had asked son to try and get a hold of them as soon as he can to get them to call our office back.  If anything new/concerning she should be instructed to go to ER, but otherwise plan for neurology eval as OP per Valerie Ramirez.  Will forward to Dr. Doug Sou nurse to see if she can try once more before office closes, and to help facilitate referral to neurology.  Dayna Dunn PA-C

## 2017-05-02 NOTE — Progress Notes (Signed)
amb ref °

## 2017-05-04 ENCOUNTER — Telehealth: Payer: Self-pay | Admitting: Physician Assistant

## 2017-05-04 NOTE — Telephone Encounter (Signed)
[facility] and inquired as to the referral to be seen there.  They said they will be contacting the patient today.

## 2017-05-09 ENCOUNTER — Telehealth: Payer: Self-pay | Admitting: *Deleted

## 2017-05-09 ENCOUNTER — Ambulatory Visit: Payer: Self-pay | Admitting: Neurology

## 2017-05-09 ENCOUNTER — Encounter: Payer: Self-pay | Admitting: *Deleted

## 2017-05-09 DIAGNOSIS — Z006 Encounter for examination for normal comparison and control in clinical research program: Secondary | ICD-10-CM

## 2017-05-09 NOTE — Telephone Encounter (Signed)
Caregiver canceled new pt appt same day due to illness.

## 2017-05-09 NOTE — Progress Notes (Signed)
[employer] Research study 30 day telephone follow up completed. Spoke with patients daughter, patient doing well. Patient had some memory issues beginning 04/08/17. On Office follow 04/22/17 up daughter mentioned to provider patient was still having memory issues. At that point the the decision was made to obtain MRI. MRI abnormal diagnosed with stroke, and patient to follow up with [facility]. No other issues that have not been reported. Next research required follow up will be between 19/May/2019-18/jul/2019. Questions encouraged and answered. I thanked her daughter for participating in research study.

## 2017-05-11 ENCOUNTER — Telehealth: Payer: Self-pay | Admitting: Physician Assistant

## 2017-05-11 MED ORDER — DOXAZOSIN MESYLATE 2 MG PO TABS: ORAL_TABLET | ORAL | 5 refills | Status: DC

## 2017-05-11 NOTE — Telephone Encounter (Signed)
Returned call to daughter who request rx for Cardura.  States they were discharged with rx but did not have any refills.    OV 04/22/17 with Almyra Deforest PA-no med changes made.  rx sent to pharmacy.

## 2017-05-11 NOTE — Telephone Encounter (Signed)
New message   Pt c/o medication issue:  1. Name of Medication: doxazosin (CARDURA) 2 MG tablet  2. How are you currently taking this medication (dosage and times per day)? TAKE 1 TABLET (8 MG TOTAL) BY MOUTH AT BEDTIME  3. Are you having a reaction (difficulty breathing--STAT)? no  4. What is your medication issue? Patient was in hospital and they changed the dose on her Cardura but only gave her a prescription for 30 days And possibly needs another prescription depending on what the doc wants her to do

## 2017-05-18 ENCOUNTER — Encounter: Payer: Self-pay | Admitting: Physician Assistant

## 2017-05-18 ENCOUNTER — Ambulatory Visit (INDEPENDENT_AMBULATORY_CARE_PROVIDER_SITE_OTHER): Payer: Medicare Other | Admitting: Physician Assistant

## 2017-05-18 VITALS — BP 118/62 | HR 84 | Resp 16 | Ht 62.0 in | Wt 223.2 lb

## 2017-05-18 DIAGNOSIS — I2510 Atherosclerotic heart disease of native coronary artery without angina pectoris: Secondary | ICD-10-CM

## 2017-05-18 DIAGNOSIS — I10 Essential (primary) hypertension: Secondary | ICD-10-CM | POA: Diagnosis not present

## 2017-05-18 DIAGNOSIS — I9789 Other postprocedural complications and disorders of the circulatory system, not elsewhere classified: Secondary | ICD-10-CM

## 2017-05-18 DIAGNOSIS — I639 Cerebral infarction, unspecified: Secondary | ICD-10-CM | POA: Diagnosis not present

## 2017-05-18 DIAGNOSIS — E039 Hypothyroidism, unspecified: Secondary | ICD-10-CM

## 2017-05-18 DIAGNOSIS — E782 Mixed hyperlipidemia: Secondary | ICD-10-CM | POA: Diagnosis not present

## 2017-05-18 DIAGNOSIS — I729 Aneurysm of unspecified site: Secondary | ICD-10-CM

## 2017-05-18 DIAGNOSIS — T81718A Complication of other artery following a procedure, not elsewhere classified, initial encounter: Secondary | ICD-10-CM

## 2017-05-18 NOTE — Patient Instructions (Signed)
Medication Instructions:  Your physician recommends that you continue on your current medications as directed. Please refer to the Current Medication list given to you today.  Labwork: Your physician recommends that you return for lab work in: TODAY-LIPID   Testing/Procedures: NONE   Follow-Up: Your physician recommends that you schedule a follow-up appointment in: 2-3 MONTHS WITH DR Martinique ONLY  Any Other Special Instructions Will Be Listed Below (If Applicable). If you need a refill on your cardiac medications before your next appointment, please call your pharmacy.

## 2017-05-18 NOTE — Progress Notes (Deleted)
    PostoperativeVisit   History of Present Illness   Valerie Ramirez is a 84 y.o. year old female who presents for postoperative follow-up for: repair of R radial artery PSA.  The patient is *** able to complete their activities of daily living.  The patient's current symptoms are: ***.   Physical Examination  ***There were no vitals filed for this visit. ***There is no height or weight on file to calculate BMI.  right arm Incision is *** healed, skin feels ***, hand grip is ***/5, sensation in digits is *** intact, ***palpable thrill, bruit can *** be auscultated     Medical Decision Making   Valerie Ramirez is a 84 y.o. year old female who presents s/p R radial artery PSA   ***  Thank you for allowing [place] to participate in this patient's care.   Adele Barthel, MD, FACS [facility] Office: [phone] Pager: [phone]

## 2017-05-18 NOTE — Progress Notes (Signed)
Cardiology Office Note    Date:  05/18/2017   ID:  Valerie Ramirez, DOB Jun 27, 1932, MRN [id number]  PCP:  Abner Greenspan, MD  Cardiologist:  Dr. Martinique  Chief Complaint  Patient presents with  . Follow-up    pseudoaneurysm   . Diarrhea    History of Present Illness:  Valerie Ramirez is a 84 y.o. female with PMH of HTN, HLD, hypothyroidism and CAD.  Echocardiogram obtained on 04/05/2017 showed EF 55-60%, grade 2 DD, PA peak pressure 34 mmHg.  She was ruled in for NSTEMI.  She eventually underwent cardiac catheterization on 04/06/2017 which showed a 95% mid left circumflex lesion treated with 3.5 x 28 mm DES, there was a 40% ostial to proximal LAD residual and to 60% proximal RCA residual.  Post procedure, patient was placed on dual antiplatelet therapy.  Due to her intolerance of Lipitor and simvastatin, she was started on low-dose Crestor.  She also needed outpatient lipid clinic referral due to LDL of 140 and total cholesterol was of 222.  I last saw the patient on 04/22/2017, it was noticed that she had a large pseudoaneurysm of the right radial artery, this was eventually repaired by Dr. Bridgett Larsson on 04/26/2017.  Daughter at the time also mentioned some mental status change immediately after the discharge, they did not seek medical attention at the time.  I obtained nonurgent MRI as she was discharged from the hospital 2 weeks prior and her symptom has been improving for the past 2 weeks.  MRI unfortunately does show punctate acute/early subacute microvascular infarct in the right anterior caudate body, chronic right occipital infarction, small chronic lacunar infarct in the right putamen extending into the mid corona radiata.  She has upcoming visit with neurology.  She presents to cardiology office visit.  She does not have any neurological residual or any imbalance issue.  She has been compliant with dual antiplatelet therapy and denies any chest pain.  She has no lower extremity edema, orthopnea  or PND.  Her right radial incision site is well-healed with good radial pulse.  She will need a fasting lipid panel and LFT today, however if LDL remains uncontrolled, I would refer her to the lipid clinic.   Past Medical History:  Diagnosis Date  . Arthritis    OA  . Cancer ([place])    endometrial  . GERD (gastroesophageal reflux disease)   . Hearing loss   . History of kidney stones   . Hyperlipidemia   . Hypertension   . Hypothyroid    "not in years"  . Shortness of breath dyspnea    04/25/17- not anymore    Past Surgical History:  Procedure Laterality Date  . ABDOMINAL HYSTERECTOMY    . ANTERIOR CERVICAL DECOMP/DISCECTOMY FUSION [name] 08/18/2015   Procedure: C5-6, C6-7 Anterior Cervical Discectomy and Fusion, Allograft, Plate;  Surgeon: Marybelle Killings, MD;  Location: [facility];  Service: Orthopedics;  Laterality: [name];  . bone spur removed Right    shoulder  . BOWEL RESECTION  07/04/2012   Procedure: SMALL BOWEL RESECTION;  Surgeon: Alvino Chapel, MD;  Location: [facility];  Service: Gynecology;;  . CHOLECYSTECTOMY    . CORONARY STENT INTERVENTION [name] 04/06/2017   Procedure: CORONARY STENT INTERVENTION;  Surgeon: Wellington Hampshire, MD;  Location: [facility];  Service: Cardiovascular;  Laterality: [name];  . DILATION AND CURETTAGE OF UTERUS  1999  . EYE SURGERY Bilateral    cataracts  . FALSE ANEURYSM REPAIR Right  04/26/2017   Procedure: 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;  Surgeon: Conrad , MD;  Location: [facility];  Service: Vascular;  Laterality: Right;  . HAND SURGERY  12/2008   after hand fx/due to fall  . JOINT REPLACEMENT Bilateral 07/1998   knees  . KNEE ARTHROPLASTY  05/23/1998   total right  . LAPAROTOMY Bilateral 07/04/2012   Procedure: EXPLORATORY LAPAROTOMY TOTAL ABDOMINAL HYSTERECTOMY BILATERAL SALPINGO-OOPHORECTOMY with small bowel resection ;  Surgeon: Alvino Chapel, MD;  Location: [facility];  Service: Gynecology;  Laterality: Bilateral;  .  LEFT HEART CATH AND CORONARY ANGIOGRAPHY [name] 04/06/2017   Procedure: LEFT HEART CATH AND CORONARY ANGIOGRAPHY;  Surgeon: Jolaine Artist, MD;  Location: [facility];  Service: Cardiovascular;  Laterality: [name];  . polyp removal  1999  . TUBAL LIGATION      Current Medications: Outpatient Medications Prior to Visit  Medication Sig Dispense Refill  . acetaminophen (TYLENOL) 650 MG CR tablet Take 650 mg by mouth every 8 (eight) hours as needed for pain.    [name] amLODipine (NORVASC) 10 MG tablet Take 1 tablet (10 mg total) by mouth daily. 30 tablet 11  . aspirin 81 MG chewable tablet Chew 1 tablet (81 mg total) by mouth daily.    . carvedilol (COREG) 25 MG tablet Take 1 tablet (25 mg total) by mouth 2 (two) times daily with a meal. 60 tablet 11  . clopidogrel (PLAVIX) 75 MG tablet Take 1 tablet (75 mg total) by mouth daily with breakfast. 30 tablet 11  . colchicine 0.6 MG tablet TAKE ONE TABLET TWO TIMES A DAY WITH FOOD AS NEEDED GOUT (Patient taking differently: TAKE ONE TABLET (0.6mg ) TWO TIMES A DAY WITH FOOD AS NEEDED GOUT) 30 tablet 5  . cyclobenzaprine (FLEXERIL) 10 MG tablet Take 10 mg by mouth 3 (three) times daily as needed for muscle spasms.    [name] doxazosin (CARDURA) 2 MG tablet TAKE 1 TABLET (8 MG TOTAL) BY MOUTH AT BEDTIME. 30 tablet 5  . DULoxetine (CYMBALTA) 20 MG capsule TAKE 1 CAPSULE (20 MG TOTAL) BY MOUTH DAILY. 90 capsule 3  . ECHINACEA PO Take 760 mg by mouth 2 (two) times daily.    [name] HYDROcodone-acetaminophen (NORCO/VICODIN) 5-325 MG tablet Take 1-2 tablets by mouth every 6 (six) hours as needed. (Patient taking differently: Take 1-2 tablets by mouth every 6 (six) hours as needed for moderate pain. ) 5 tablet 0  . loperamide (IMODIUM) 2 MG capsule Take 1 capsule (2 mg total) by mouth as needed for diarrhea or loose stools. 20 capsule 0  . losartan (COZAAR) 25 MG tablet Take 1 tablet (25 mg total) by mouth daily. 30 tablet 11  . naproxen sodium (ALEVE) 220 MG tablet Take  220 mg by mouth daily as needed (back & muscle pain).    . nitroGLYCERIN (NITROSTAT) 0.4 MG SL tablet Place 1 tablet (0.4 mg total) under the tongue every 5 (five) minutes x 3 doses as needed for chest pain. 25 tablet 12  . rosuvastatin (CRESTOR) 10 MG tablet Take 1 tablet (10 mg total) by mouth daily at 6 PM. 30 tablet 11  . traMADol (ULTRAM) 50 MG tablet Take 1 tablet (50 mg total) by mouth every 6 (six) hours as needed. 12 tablet 0  . vitamin B-12 (CYANOCOBALAMIN) 1000 MCG tablet Take 1,000 mcg by mouth daily.     No facility-administered medications prior to visit.      Allergies:   Allopurinol; Lipitor [atorvastatin calcium];  Simvastatin; and Tape   Social History   Socioeconomic History  . Marital status: Married    Spouse name: None  . Number of children: None  . Years of education: None  . Highest education level: None  Social Needs  . Financial resource strain: None  . Food insecurity - worry: None  . Food insecurity - inability: None  . Transportation needs - medical: None  . Transportation needs - non-medical: None  Occupational History  . None  Tobacco Use  . Smoking status: Never Smoker  . Smokeless tobacco: Never Used  Substance and Sexual Activity  . Alcohol use: No    Alcohol/week: 0.0 oz  . Drug use: No  . Sexual activity: No  Other Topics Concern  . None  Social History Narrative  . None     Family History:  The patient's family history includes Cancer in her brother, brother, and brother; Heart disease in her brother and sister; Stroke in her sister.   ROS:   Please see the history of present illness.    ROS All other systems reviewed and are negative.   PHYSICAL EXAM:   VS:  BP 118/62   Pulse 84   Resp 16   Ht 5\' 2"  (1.575 m)   Wt 223 lb 3.2 oz (101.2 kg)   SpO2 99%   BMI 40.82 kg/m    GEN: Well nourished, well developed, in no acute distress  HEENT: normal  Neck: no JVD, carotid bruits, or masses Cardiac: RRR; no murmurs, rubs, or  gallops,no edema  Respiratory:  clear to auscultation bilaterally, normal work of breathing GI: soft, nontender, nondistended, + BS MS: no deformity or atrophy  Skin: warm and dry, no rash Neuro:  Alert and Oriented x 3, Strength and sensation are intact Psych: euthymic mood, full affect  Wt Readings from Last 3 Encounters:  05/18/17 223 lb 3.2 oz (101.2 kg)  04/26/17 233 lb (105.7 kg)  04/22/17 229 lb (103.9 kg)      Studies/Labs Reviewed:   EKG:  EKG is not ordered today.   Recent Labs: 04/05/2017: TSH 4.855 04/26/2017: ALT 24; BUN 24; Creatinine, Ser 1.29; Hemoglobin 10.6; Platelets 196; Potassium 3.8; Sodium 137   Lipid Panel    Component Value Date/Time   CHOL 222 (H) 04/06/2017 0301   TRIG 165 (H) 04/06/2017 0301   HDL 49 04/06/2017 0301   CHOLHDL 4.5 04/06/2017 0301   VLDL 33 04/06/2017 0301   LDLCALC 140 (H) 04/06/2017 0301   LDLDIRECT 136.0 10/31/2015 0959    Additional studies/ records that were reviewed today include:   Cath 04/06/2017 Conclusion     Mid Cx lesion is 95% stenosed.  A drug-eluting stent was successfully placed using a STENT SVELTE RX 3.50 X 28MM.  Post intervention, there is a 0% residual stenosis.  Successful angioplasty and drug-eluting stent placement to the mid left circumflex.    Echo 04/05/2017 LV EF: 55% - 60%  Study Conclusions  - Left ventricle: The cavity size was normal. Wall thickness was increased in a pattern of severe LVH. Systolic function was normal. The estimated ejection fraction was in the range of 55% to 60%. Wall motion was normal; there were no regional wall motion abnormalities. Doppler parameters are consistent with pseudonormal left ventricular relaxation (grade 2 diastolic dysfunction). The E/e&' ratio is >15, suggesting elevated LV filling pressure. - Aortic valve: Sclerosis without stenosis. Transvalvular velocity was minimally increased. Mean gradient (S): 7 mm Hg. -  Mitral valve: Mildly thickened  leaflets . There was trivial regurgitation. - Left atrium: Moderately dilated. - Tricuspid valve: There was mild regurgitation. - Pulmonary arteries: PA peak pressure: 34 mm Hg (S). - Inferior vena cava: The vessel was normal in size. The respirophasic diameter changes were in the normal range (>= 50%), consistent with normal central venous pressure.  Impressions:  - LVEF 55-60%, severe LVH, normal wall motion, grade 2 DD with elevated LV filling pressure, aortic valve sclerosis, trivial MR, moderate LAE, mild TR, RVSP 34 mmHg, normal IVC.    ASSESSMENT:    1. Coronary artery disease involving native coronary artery of native heart without angina pectoris   2. Mixed hyperlipidemia   3. Essential hypertension   4. Hypothyroidism, unspecified type   5. Cerebrovascular accident (CVA), unspecified mechanism ([place])   6. Pseudoaneurysm following procedure ([place])      PLAN:  In order of problems listed above:  1. CAD: Compliant with dual antiplatelet therapy, continue aspirin, Plavix, carvedilol, losartan and Crestor.  2. Hyperlipidemia: On Crestor 10 mg, however previous LDL 140.  Intolerant of Lipitor and Zocor.  Obtain fasting lipid panel and LFT today, if uncontrolled, will refer the patient to lipid clinic.  3. Hypertension: Blood pressure very well controlled  4. Pseudoaneurysm of the right radial artery: Noted during last office visit after cardiac catheterization, underwent vascular repair by Dr. Bridgett Larsson  5. History of CVA: Confirmed on MRI, noted by daughter to have some altered mental status after last cardiac catheterization.  However MRI also showed some chronic infarct as well in the occipital lobe.  No evidence of atrial fibrillation at this time.  6. Hypothyroidism: Managed by primary care provider.      Medication Adjustments/Labs and Tests Ordered: Current medicines are reviewed at length with the patient today.  Concerns  regarding medicines are outlined above.  Medication changes, Labs and Tests ordered today are listed in the Patient Instructions below. Patient Instructions  Medication Instructions:  Your physician recommends that you continue on your current medications as directed. Please refer to the Current Medication list given to you today.  Labwork: Your physician recommends that you return for lab work in: TODAY-LIPID   Testing/Procedures: NONE   Follow-Up: Your physician recommends that you schedule a follow-up appointment in: 2-3 MONTHS WITH DR Martinique ONLY  Any Other Special Instructions Will Be Listed Below (If Applicable). If you need a refill on your cardiac medications before your next appointment, please call your pharmacy.     Hilbert Corrigan, [place]  05/18/2017 12:56 PM    [address], [place], [place]  [address] Phone: [phone]; Fax: [phone]

## 2017-05-19 DIAGNOSIS — H43812 Vitreous degeneration, left eye: Secondary | ICD-10-CM | POA: Diagnosis not present

## 2017-05-19 LAB — LIPID PANEL
Chol/HDL Ratio: 2.4 ratio (ref 0.0–4.4)
Cholesterol, Total: 123 mg/dL (ref 100–199)
HDL: 52 mg/dL (ref >39)
LDL Calculated: 28 mg/dL (ref 0–99)
Triglycerides: 214 mg/dL — ABNORMAL HIGH (ref 0–149)
VLDL Cholesterol Cal: 43 mg/dL — ABNORMAL HIGH (ref 5–40)

## 2017-05-20 ENCOUNTER — Encounter: Payer: Medicare Other | Admitting: Vascular Surgery

## 2017-05-23 ENCOUNTER — Other Ambulatory Visit: Payer: Self-pay | Admitting: Family Medicine

## 2017-05-24 NOTE — Telephone Encounter (Signed)
Med filled.  

## 2017-05-25 ENCOUNTER — Encounter: Payer: Medicare Other | Admitting: Vascular Surgery

## 2017-05-25 ENCOUNTER — Ambulatory Visit (INDEPENDENT_AMBULATORY_CARE_PROVIDER_SITE_OTHER): Payer: Medicare Other | Admitting: Family Medicine

## 2017-05-25 ENCOUNTER — Encounter: Payer: Self-pay | Admitting: Family Medicine

## 2017-05-25 VITALS — BP 132/68 | HR 66 | Temp 97.7°F | Ht 62.0 in | Wt 226.2 lb

## 2017-05-25 DIAGNOSIS — N183 Chronic kidney disease, stage 3 unspecified: Secondary | ICD-10-CM

## 2017-05-25 DIAGNOSIS — S81812A Laceration without foreign body, left lower leg, initial encounter: Secondary | ICD-10-CM | POA: Diagnosis not present

## 2017-05-25 DIAGNOSIS — C541 Malignant neoplasm of endometrium: Secondary | ICD-10-CM

## 2017-05-25 DIAGNOSIS — E039 Hypothyroidism, unspecified: Secondary | ICD-10-CM

## 2017-05-25 DIAGNOSIS — N179 Acute kidney failure, unspecified: Secondary | ICD-10-CM

## 2017-05-25 DIAGNOSIS — E7800 Pure hypercholesterolemia, unspecified: Secondary | ICD-10-CM

## 2017-05-25 DIAGNOSIS — I495 Sick sinus syndrome: Secondary | ICD-10-CM | POA: Diagnosis not present

## 2017-05-25 DIAGNOSIS — I10 Essential (primary) hypertension: Secondary | ICD-10-CM | POA: Diagnosis not present

## 2017-05-25 DIAGNOSIS — I2510 Atherosclerotic heart disease of native coronary artery without angina pectoris: Secondary | ICD-10-CM

## 2017-05-25 DIAGNOSIS — M47899 Other spondylosis, site unspecified: Secondary | ICD-10-CM

## 2017-05-25 DIAGNOSIS — R739 Hyperglycemia, unspecified: Secondary | ICD-10-CM

## 2017-05-25 DIAGNOSIS — M1A9XX0 Chronic gout, unspecified, without tophus (tophi): Secondary | ICD-10-CM

## 2017-05-25 DIAGNOSIS — Z8673 Personal history of transient ischemic attack (TIA), and cerebral infarction without residual deficits: Secondary | ICD-10-CM

## 2017-05-25 LAB — COMPREHENSIVE METABOLIC PANEL
ALT: 29 U/L (ref 0–35)
AST: 31 U/L (ref 0–37)
Albumin: 4 g/dL (ref 3.5–5.2)
Alkaline Phosphatase: 101 U/L (ref 39–117)
BUN: 31 mg/dL — ABNORMAL HIGH (ref 6–23)
CO2: 22 mEq/L (ref 19–32)
Calcium: 9.2 mg/dL (ref 8.4–10.5)
Chloride: 110 mEq/L (ref 96–112)
Creatinine, Ser: 1.6 mg/dL — ABNORMAL HIGH (ref 0.40–1.20)
GFR: 32.73 mL/min — ABNORMAL LOW (ref >60.00)
Glucose, Bld: 143 mg/dL — ABNORMAL HIGH (ref 70–99)
Potassium: 4.2 mEq/L (ref 3.5–5.1)
Sodium: 138 mEq/L (ref 135–145)
Total Bilirubin: 0.5 mg/dL (ref 0.2–1.2)
Total Protein: 6.9 g/dL (ref 6.0–8.3)

## 2017-05-25 LAB — TSH: TSH: 2.61 u[IU]/mL (ref 0.35–4.50)

## 2017-05-25 LAB — HEMOGLOBIN A1C: Hgb A1c MFr Bld: 6.3 % (ref 4.6–6.5)

## 2017-05-25 MED ORDER — COLCHICINE 0.6 MG PO TABS: 0.6000 mg | ORAL_TABLET | Freq: Two times a day (BID) | ORAL | 11 refills | Status: DC

## 2017-05-25 NOTE — Progress Notes (Signed)
Subjective:    Patient ID: Valerie Ramirez, female    DOB: 1932-10-29, 84 y.o.   MRN: [id number]  HPI Here for f/u of chronic health problems   Daughter is helping out    Bumped her leg (left) on Sunday and it bled for several days  It is starting to heal per pt  Washing with soap and water and using neosporin     Wt Readings from Last 3 Encounters:  05/25/17 226 lb 4 oz (102.6 kg)  05/18/17 223 lb 3.2 oz (101.2 kg)  04/26/17 233 lb (105.7 kg)  daughter says she is loosing weight  Has been active  Planning to start cardio-pulm rehab  41.38 kg/m   Had flu shot 11/18  bp is stable today - now much more stable  Family puts medications out daily-doing well with that  Takes amlodipine and coreg and cardura and losartan No cp or palpitations or headaches or edema  No side effects to medicines  BP Readings from Last 3 Encounters:  05/25/17 132/68  05/18/17 118/62  04/26/17 (!) 133/57     No cp or sob at all    Lab Results  Component Value Date   CREATININE 1.29 (H) 04/26/2017   BUN 24 (H) 04/26/2017   NA 137 04/26/2017   K 3.8 04/26/2017   CL 107 04/26/2017   CO2 20 (L) 04/26/2017    Hx of CKD stage 3 She drinks "a lot of water" - really likes it so not a problem    Lab Results  Component Value Date   ALT 24 04/26/2017   AST 32 04/26/2017   ALKPHOS 104 04/26/2017   BILITOT 0.8 04/26/2017    Elevated TSH in dec  Lab Results  Component Value Date   TSH 4.855 (H) 04/05/2017   free T4 was normal at the time No thyroid supplementation   Hyperglycemia Lab Results  Component Value Date   HGBA1C 6.0 07/26/2016    Hyperlipidemia  Lab Results  Component Value Date   CHOL 123 05/18/2017   HDL 52 05/18/2017   LDLCALC 28 05/18/2017   LDLDIRECT 136.0 10/31/2015   TRIG 214 (H) 05/18/2017   CHOLHDL 2.4 05/18/2017   In setting of heart dz Intolerant of some statins-on low dose crestor  Also eating better !   Takes cymbalta for mood = doing fair  emotionally- lost her husband  Cannot sleep well overall  About 2 hours at a time / gets naps   Was diagnosed with CVA at last cardiology visit (some memory issues)- thinks she had a stroke right after her cardiac cath -now has new type of stent  Finding on MRI  Has neuro and vascular appts upcoming  Also had repair of pseudoaneurysm in her arm   Gout is improved  Cardiologist had her change colchicine to bid on a schedule   OA spine is worse Not a surg candidate  Needs handicapped parking form- cannot walk over 200 ft   Patient Active Problem List   Diagnosis Date Noted  . Sick sinus syndrome ([place]) 05/25/2017  . Unstable angina ([place])   . Exertional chest pain 04/05/2017  . Hypertensive urgency 04/05/2017  . CKD (chronic kidney disease), stage III ([place]) 04/05/2017  . Normocytic normochromic anemia 04/05/2017  . Thoracic back pain 09/15/2016  . History of radius fracture 06/01/2016  . Hyponatremia 04/25/2016  . AKI (acute kidney injury) ([place]) 04/25/2016  . Leucocytosis 04/25/2016  . Status post cervical spinal fusion 08/18/2015  .  Osteoarthritis, hip, bilateral 08/12/2014  . Hearing loss in right ear 06/12/2014  . Colon cancer screening 08/10/2013  . Seborrheic keratoses, inflamed 08/10/2013  . History of fall 04/10/2013  . Encounter for Medicare annual wellness exam 08/08/2012  . Gout 07/14/2012  . History of endometrial cancer 05/25/2012  . Degenerative joint disease of cervical spine 05/03/2011  . Other screening mammogram 03/31/2011  . Hyperglycemia 09/26/2007  . Morbid obesity ([place]) 07/12/2007  . GOITER, MULTINODULAR 07/11/2007  . Essential hypertension 07/11/2007  . History of cardiomyopathy 07/11/2007  . Osteoarthritis 07/11/2007  . 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 Hypothyroidism 10/05/2006  . HYPERCHOLESTEROLEMIA, PURE 10/05/2006   Past Medical History:  Diagnosis Date  . Arthritis    OA  . Cancer ([place])    endometrial  . GERD  (gastroesophageal reflux disease)   . Hearing loss   . History of kidney stones   . Hyperlipidemia   . Hypertension   . Hypothyroid    "not in years"  . Shortness of breath dyspnea    04/25/17- not anymore   Past Surgical History:  Procedure Laterality Date  . ABDOMINAL HYSTERECTOMY    . ANTERIOR CERVICAL DECOMP/DISCECTOMY FUSION [name] 08/18/2015   Procedure: C5-6, C6-7 Anterior Cervical Discectomy and Fusion, Allograft, Plate;  Surgeon: Marybelle Killings, MD;  Location: [facility];  Service: Orthopedics;  Laterality: [name];  . bone spur removed Right    shoulder  . BOWEL RESECTION  07/04/2012   Procedure: SMALL BOWEL RESECTION;  Surgeon: Alvino Chapel, MD;  Location: [facility];  Service: Gynecology;;  . CHOLECYSTECTOMY    . CORONARY STENT INTERVENTION [name] 04/06/2017   Procedure: CORONARY STENT INTERVENTION;  Surgeon: Wellington Hampshire, MD;  Location: [facility];  Service: Cardiovascular;  Laterality: [name];  . DILATION AND CURETTAGE OF UTERUS  1999  . EYE SURGERY Bilateral    cataracts  . FALSE ANEURYSM REPAIR Right 04/26/2017   Procedure: 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;  Surgeon: Conrad Farmville, MD;  Location: [facility];  Service: Vascular;  Laterality: Right;  . HAND SURGERY  12/2008   after hand fx/due to fall  . JOINT REPLACEMENT Bilateral 07/1998   knees  . KNEE ARTHROPLASTY  05/23/1998   total right  . LAPAROTOMY Bilateral 07/04/2012   Procedure: EXPLORATORY LAPAROTOMY TOTAL ABDOMINAL HYSTERECTOMY BILATERAL SALPINGO-OOPHORECTOMY with small bowel resection ;  Surgeon: Alvino Chapel, MD;  Location: [facility];  Service: Gynecology;  Laterality: Bilateral;  . LEFT HEART CATH AND CORONARY ANGIOGRAPHY [name] 04/06/2017   Procedure: LEFT HEART CATH AND CORONARY ANGIOGRAPHY;  Surgeon: Jolaine Artist, MD;  Location: [facility];  Service: Cardiovascular;  Laterality: [name];  . polyp removal  1999  . TUBAL LIGATION     Social History   Tobacco Use  . Smoking  status: Never Smoker  . Smokeless tobacco: Never Used  Substance Use Topics  . Alcohol use: No    Alcohol/week: 0.0 oz  . Drug use: No   Family History  Problem Relation Age of Onset  . Cancer Brother        LEUKEMIA  . Cancer Brother        LUNG  . Cancer Brother        STOMACH  . Heart disease Sister        CABG  . Stroke Sister   . Heart disease Brother        CABG   Allergies  Allergen Reactions  .  Allopurinol Nausea Only  . Lipitor [Atorvastatin Calcium] Other (See Comments)    Leg ache and ache all over  . Simvastatin Other (See Comments)    REACTION: muscle pain  . Tape Itching and Rash    "Cannot use paper tape" --- also causes blisters. Use plastic tape   Current Outpatient Medications on File Prior to Visit  Medication Sig Dispense Refill  . acetaminophen (TYLENOL) 650 MG CR tablet Take 650 mg by mouth every 8 (eight) hours as needed for pain.    [name] amLODipine (NORVASC) 10 MG tablet Take 1 tablet (10 mg total) by mouth daily. 30 tablet 11  . aspirin 81 MG chewable tablet Chew 1 tablet (81 mg total) by mouth daily.    . carvedilol (COREG) 25 MG tablet Take 1 tablet (25 mg total) by mouth 2 (two) times daily with a meal. 60 tablet 11  . clopidogrel (PLAVIX) 75 MG tablet Take 1 tablet (75 mg total) by mouth daily with breakfast. 30 tablet 11  . cyclobenzaprine (FLEXERIL) 10 MG tablet Take 10 mg by mouth 3 (three) times daily as needed for muscle spasms.    [name] doxazosin (CARDURA) 2 MG tablet TAKE 1 TABLET (8 MG TOTAL) BY MOUTH AT BEDTIME. 30 tablet 5  . DULoxetine (CYMBALTA) 20 MG capsule TAKE 1 CAPSULE (20 MG TOTAL) BY MOUTH DAILY. 90 capsule 1  . ECHINACEA PO Take 760 mg by mouth 2 (two) times daily.    [name] HYDROcodone-acetaminophen (NORCO/VICODIN) 5-325 MG tablet Take 1-2 tablets by mouth every 6 (six) hours as needed. (Patient taking differently: Take 1-2 tablets by mouth every 6 (six) hours as needed for moderate pain. ) 5 tablet 0  . loperamide (IMODIUM) 2 MG  capsule Take 1 capsule (2 mg total) by mouth as needed for diarrhea or loose stools. 20 capsule 0  . losartan (COZAAR) 25 MG tablet Take 1 tablet (25 mg total) by mouth daily. 30 tablet 11  . nitroGLYCERIN (NITROSTAT) 0.4 MG SL tablet Place 1 tablet (0.4 mg total) under the tongue every 5 (five) minutes x 3 doses as needed for chest pain. 25 tablet 12  . rosuvastatin (CRESTOR) 10 MG tablet Take 1 tablet (10 mg total) by mouth daily at 6 PM. 30 tablet 11  . traMADol (ULTRAM) 50 MG tablet Take 1 tablet (50 mg total) by mouth every 6 (six) hours as needed. 12 tablet 0  . vitamin B-12 (CYANOCOBALAMIN) 1000 MCG tablet Take 1,000 mcg by mouth daily.     No current facility-administered medications on file prior to visit.     Review of Systems  Constitutional: Negative for activity change, appetite change, fatigue, fever and unexpected weight change.  HENT: Negative for congestion, ear pain, rhinorrhea, sinus pressure and sore throat.   Eyes: Negative for pain, redness and visual disturbance.  Respiratory: Negative for cough, shortness of breath and wheezing.   Cardiovascular: Negative for chest pain and palpitations.  Gastrointestinal: Negative for abdominal pain, blood in stool, constipation and diarrhea.  Endocrine: Negative for polydipsia and polyuria.  Genitourinary: Negative for dysuria, frequency and urgency.  Musculoskeletal: Positive for arthralgias and back pain. Negative for myalgias.  Skin: Negative for pallor and rash.  Allergic/Immunologic: Negative for environmental allergies.  Neurological: Negative for dizziness, syncope and headaches.  Hematological: Negative for adenopathy. Does not bruise/bleed easily.  Psychiatric/Behavioral: Negative for decreased concentration and dysphoric mood. The patient is not nervous/anxious.        Objective:   Physical Exam  Constitutional: She appears  well-developed and well-nourished. No distress.  obese and well appearing   HENT:  Head:  Normocephalic and atraumatic.  Mouth/Throat: Oropharynx is clear and moist.  Eyes: Conjunctivae and EOM are normal. Pupils are equal, round, and reactive to light.  Neck: Normal range of motion. Neck supple. No JVD present. Carotid bruit is not present. No thyromegaly present.  No change in thyroid exam  Cardiovascular: Normal rate, regular rhythm, normal heart sounds and intact distal pulses. Exam reveals no gallop.  Pulmonary/Chest: Effort normal and breath sounds normal. No respiratory distress. She has no wheezes. She has no rales.  No crackles  Abdominal: Soft. Bowel sounds are normal. She exhibits no distension, no abdominal bruit and no mass. There is no tenderness.  Musculoskeletal: She exhibits no edema.  Lymphadenopathy:    She has no cervical adenopathy.  Neurological: She is alert. She has normal reflexes. No cranial nerve deficit. She exhibits normal muscle tone. Coordination normal.  Skin: Skin is warm and dry. No rash noted. No pallor.  Psychiatric: She has a normal mood and affect.  Mentally sharp Cheerful and talkative           Assessment & Plan:   Problem List Items Addressed This Visit      Cardiovascular and Mediastinum   Essential hypertension - Primary    bp in fair control at this time  BP Readings from Last 1 Encounters:  05/25/17 132/68   No changes needed Disc lifstyle change with low sodium diet and exercise  Wt los enc  cmet today  Has CKD      Relevant Orders   Comprehensive metabolic panel (Completed)   Sick sinus syndrome (HCC)     Endocrine   Hypothyroidism    Last tsh was up but FT4 nl  Re check TSH today  No medications at this time (was on levothy in the past)      Relevant Orders   TSH (Completed)     Musculoskeletal and Integument   Osteoarthritis    In spine- with difficulty ambulating  Handicapped parking form filled out       Relevant Medications   colchicine 0.6 MG tablet   Skin tear of left lower leg without  complication    Healing well with soap/water and abx ointment Disc what to watch for         Genitourinary   AKI (acute kidney injury) ([place])    Multifactorial  Rev last labs  No urinary symptoms  Disc fluid intake-does well with this  Renal panel today      CKD (chronic kidney disease), stage III ([place])   RESOLVED: Endometrial cancer (HCC)   Relevant Medications   colchicine 0.6 MG tablet     Other   Gout    Doing better on scheduled colchicine now -bid (from cardiology)      H/O: CVA (cerebrovascular accident)    Suspect this occurred with last cath- had temporary MS change  MRI shows several old infarcts  For neuro f/u  No residual symptoms now  Enc to continue plavix/bp and cholesterol medication       HYPERCHOLESTEROLEMIA, PURE    Now on low dose crestor and tolerating well  LDL is down to 28 Disc low sat/trans fat diet   Disc goals for lipids and reasons to control them Rev labs with pt Rev low sat fat diet in detail       Hyperglycemia    Check A1C today  disc imp of low glycemic  diet and wt loss to prevent DM2       Relevant Orders   Hemoglobin A1c (Completed)   Morbid obesity (HCC)    Discussed how this problem influences overall health and the risks it imposes  Reviewed plan for weight loss with lower calorie diet (via better food choices and also portion control or program like weight watchers) and exercise building up to or more than 30 minutes 5 days per week including some aerobic activity

## 2017-05-25 NOTE — Patient Instructions (Addendum)
For kidney health - aim for 64 oz of fluids per day (mostly water)   Continue current medicines  Labs today for thyroid and A1C (sugar) and chemistries   Glad you doing better   Keep wound on leg clean with soap and water and use neosporin   Continue specialist follow ups

## 2017-05-26 ENCOUNTER — Telehealth: Payer: Self-pay

## 2017-05-26 DIAGNOSIS — S81812A Laceration without foreign body, left lower leg, initial encounter: Secondary | ICD-10-CM | POA: Insufficient documentation

## 2017-05-26 DIAGNOSIS — Z8673 Personal history of transient ischemic attack (TIA), and cerebral infarction without residual deficits: Secondary | ICD-10-CM | POA: Insufficient documentation

## 2017-05-26 MED ORDER — OMEGA-3-ACID ETHYL ESTERS 1 G PO CAPS: 1.0000 g | ORAL_CAPSULE | Freq: Two times a day (BID) | ORAL | 4 refills | Status: DC

## 2017-05-26 NOTE — Assessment & Plan Note (Signed)
Suspect this occurred with last cath- had temporary MS change  MRI shows several old infarcts  For neuro f/u  No residual symptoms now  Enc to continue plavix/bp and cholesterol medication

## 2017-05-26 NOTE — Assessment & Plan Note (Signed)
bp in fair control at this time  BP Readings from Last 1 Encounters:  05/25/17 132/68   No changes needed Disc lifstyle change with low sodium diet and exercise  Wt los enc  cmet today  Has CKD

## 2017-05-26 NOTE — Assessment & Plan Note (Signed)
Multifactorial  Rev last labs  No urinary symptoms  Disc fluid intake-does well with this  Renal panel today

## 2017-05-26 NOTE — Assessment & Plan Note (Signed)
Check A1C today  disc imp of low glycemic diet and wt loss to prevent DM2

## 2017-05-26 NOTE — Telephone Encounter (Signed)
Patients daughter Sherre Poot, per DPR, informed of patients labs results.

## 2017-05-26 NOTE — Telephone Encounter (Signed)
-----   Message from [place], [place] sent at 05/23/2017 12:59 PM EST ----- Bad cholesterol (LDL) better controlled, triglyceride still very high, recommend Lovaza 1000mg  BID.

## 2017-05-26 NOTE — Assessment & Plan Note (Signed)
Healing well with soap/water and abx ointment Disc what to watch for

## 2017-05-26 NOTE — Assessment & Plan Note (Signed)
Now on low dose crestor and tolerating well  LDL is down to 28 Disc low sat/trans fat diet   Disc goals for lipids and reasons to control them Rev labs with pt Rev low sat fat diet in detail

## 2017-05-26 NOTE — Assessment & Plan Note (Signed)
Discussed how this problem influences overall health and the risks it imposes  Reviewed plan for weight loss with lower calorie diet (via better food choices and also portion control or program like weight watchers) and exercise building up to or more than 30 minutes 5 days per week including some aerobic activity    

## 2017-05-26 NOTE — Assessment & Plan Note (Signed)
Doing better on scheduled colchicine now -bid (from cardiology)

## 2017-05-26 NOTE — Assessment & Plan Note (Signed)
In spine- with difficulty ambulating  Handicapped parking form filled out

## 2017-05-26 NOTE — Assessment & Plan Note (Signed)
Last tsh was up but FT4 nl  Re check TSH today  No medications at this time (was on levothy in the past)

## 2017-05-27 ENCOUNTER — Telehealth: Payer: Self-pay | Admitting: Family Medicine

## 2017-05-27 DIAGNOSIS — N183 Chronic kidney disease, stage 3 unspecified: Secondary | ICD-10-CM

## 2017-05-27 NOTE — Telephone Encounter (Signed)
-----   Message from Tammi Sou, [place] sent at 05/26/2017  3:33 PM EST ----- Daughter notified of lab results and Dr. Marliss Coots comments. Daughter agrees with referral to kidney specialist she would like to see someone in  if possible. Please put referral in and I advise pt our [facility] will call to schedule appt.  Also daughter said you mentioned that her cholesterol was good but her cardiologist wants her to be on an additional cholesterol medication, she just wants you to take a look at his comments/labs and tell her what you think

## 2017-05-27 NOTE — Telephone Encounter (Signed)
Her triglycerides were mildly high -perhaps that is what the cholesterol comment was about  I will do renal ref and route to [facility]

## 2017-06-02 NOTE — Telephone Encounter (Signed)
LMOM [place]

## 2017-06-10 NOTE — Progress Notes (Signed)
    Postoperative Access Visit   History of Present Illness   Valerie Ramirez is a 84 y.o. year old female who presents for postoperative follow-up for: Repair of R radial artery PSA (Date: 04/26/17).  The patient's wounds are healed.  The patient's current symptoms are: none.  Recent MRI consistent with CVA   Physical Examination   Vitals:   06/15/17 0830 06/15/17 0834  BP: (!) 171/77 (!) 171/80  Pulse: 80   Resp: 18   Temp: (!) 97.2 F (36.2 C)   TempSrc: Oral   SpO2: 96%   Weight: 229 lb 4.8 oz (104 kg)   Height: 5\' 2"  (1.575 m)     right arm Inc healed, hand grip 5/5, sensation intact, no residual palpable mass in right wrist    Medical Decision Making   Valerie Ramirez is a 84 y.o. year old female who presents s/p repair of sx R radial PSA, CVA   Doubt CVA related to radial artery PSA repair, rather cardiac cath probably etio for both the radial PSA and CVA  Patient can follow up as needed  Thank you for allowing [place] to participate in this patient's care.   Adele Barthel, MD, FACS [facility] Office: [phone] Pager: [phone]

## 2017-06-15 ENCOUNTER — Ambulatory Visit: Payer: Self-pay | Admitting: Neurology

## 2017-06-15 ENCOUNTER — Encounter: Payer: Self-pay | Admitting: Vascular Surgery

## 2017-06-15 ENCOUNTER — Ambulatory Visit (INDEPENDENT_AMBULATORY_CARE_PROVIDER_SITE_OTHER): Payer: Medicare Other | Admitting: Vascular Surgery

## 2017-06-15 VITALS — BP 171/80 | HR 80 | Temp 97.2°F | Resp 18 | Ht 62.0 in | Wt 229.3 lb

## 2017-06-15 DIAGNOSIS — S55102S Unspecified injury of radial artery at forearm level, left arm, sequela: Secondary | ICD-10-CM

## 2017-06-29 NOTE — Research (Signed)
OPTIMIZE Protocol Labs Component     Latest Ref Rng & Units 04/05/2017 04/05/2017 04/05/2017 04/06/2017         3:34 AM  5:55 AM  8:43 AM 17:30 Pre PCI  Troponin I     <0.03 ng/mL <0.03 <0.03 <0.03 0.11 (HH)   Component     Latest Ref Rng & Units 04/07/2017 Post PCI 4-12 04/07/2017 Post PCI 12-20         3:18 AM  9:28 AM  Troponin I     <0.03 ng/mL 0.70 (HH) 1.09 (HH)

## 2017-07-15 DIAGNOSIS — N183 Chronic kidney disease, stage 3 (moderate): Secondary | ICD-10-CM | POA: Diagnosis not present

## 2017-07-15 DIAGNOSIS — N179 Acute kidney failure, unspecified: Secondary | ICD-10-CM | POA: Diagnosis not present

## 2017-07-15 DIAGNOSIS — N390 Urinary tract infection, site not specified: Secondary | ICD-10-CM | POA: Diagnosis not present

## 2017-07-19 DIAGNOSIS — N179 Acute kidney failure, unspecified: Secondary | ICD-10-CM | POA: Diagnosis not present

## 2017-07-26 ENCOUNTER — Telehealth: Payer: Self-pay | Admitting: Family Medicine

## 2017-07-26 DIAGNOSIS — N183 Chronic kidney disease, stage 3 unspecified: Secondary | ICD-10-CM

## 2017-07-26 DIAGNOSIS — R739 Hyperglycemia, unspecified: Secondary | ICD-10-CM

## 2017-07-26 DIAGNOSIS — M1A9XX0 Chronic gout, unspecified, without tophus (tophi): Secondary | ICD-10-CM

## 2017-07-26 DIAGNOSIS — E039 Hypothyroidism, unspecified: Secondary | ICD-10-CM

## 2017-07-26 DIAGNOSIS — E7800 Pure hypercholesterolemia, unspecified: Secondary | ICD-10-CM

## 2017-07-26 DIAGNOSIS — I10 Essential (primary) hypertension: Secondary | ICD-10-CM

## 2017-07-26 NOTE — Telephone Encounter (Signed)
-----   Message from Eustace Pen, LPN sent at 09/26/6787  5:09 PM EDT ----- Regarding: Labs 4/10 Lab orders needed. Thank you.  Insurance:  [employer]

## 2017-07-27 ENCOUNTER — Ambulatory Visit (INDEPENDENT_AMBULATORY_CARE_PROVIDER_SITE_OTHER): Payer: Medicare Other

## 2017-07-27 VITALS — BP 140/82 | HR 59 | Temp 97.9°F | Ht 62.5 in | Wt 225.0 lb

## 2017-07-27 DIAGNOSIS — I10 Essential (primary) hypertension: Secondary | ICD-10-CM

## 2017-07-27 DIAGNOSIS — N183 Chronic kidney disease, stage 3 unspecified: Secondary | ICD-10-CM

## 2017-07-27 DIAGNOSIS — R739 Hyperglycemia, unspecified: Secondary | ICD-10-CM | POA: Diagnosis not present

## 2017-07-27 DIAGNOSIS — Z0000 Encounter for general adult medical examination without abnormal findings: Secondary | ICD-10-CM

## 2017-07-27 DIAGNOSIS — E039 Hypothyroidism, unspecified: Secondary | ICD-10-CM

## 2017-07-27 DIAGNOSIS — M1A9XX0 Chronic gout, unspecified, without tophus (tophi): Secondary | ICD-10-CM

## 2017-07-27 DIAGNOSIS — E7800 Pure hypercholesterolemia, unspecified: Secondary | ICD-10-CM

## 2017-07-27 LAB — CBC WITH DIFFERENTIAL/PLATELET
Basophils Absolute: 0.1 10*3/uL (ref 0.0–0.1)
Basophils Relative: 1.1 % (ref 0.0–3.0)
Eosinophils Absolute: 0.1 10*3/uL (ref 0.0–0.7)
Eosinophils Relative: 1.2 % (ref 0.0–5.0)
HCT: 32.8 % — ABNORMAL LOW (ref 36.0–46.0)
Hemoglobin: 10.9 g/dL — ABNORMAL LOW (ref 12.0–15.0)
Lymphocytes Relative: 28.9 % (ref 12.0–46.0)
Lymphs Abs: 1.7 10*3/uL (ref 0.7–4.0)
MCHC: 33.4 g/dL (ref 30.0–36.0)
MCV: 86.8 fl (ref 78.0–100.0)
Monocytes Absolute: 0.7 10*3/uL (ref 0.1–1.0)
Monocytes Relative: 12.3 % — ABNORMAL HIGH (ref 3.0–12.0)
Neutro Abs: 3.3 10*3/uL (ref 1.4–7.7)
Neutrophils Relative %: 56.5 % (ref 43.0–77.0)
Platelets: 181 10*3/uL (ref 150.0–400.0)
RBC: 3.77 Mil/uL — ABNORMAL LOW (ref 3.87–5.11)
RDW: 17.7 % — ABNORMAL HIGH (ref 11.5–15.5)
WBC: 5.8 10*3/uL (ref 4.0–10.5)

## 2017-07-27 LAB — COMPREHENSIVE METABOLIC PANEL
ALT: 40 U/L — ABNORMAL HIGH (ref 0–35)
AST: 39 U/L — ABNORMAL HIGH (ref 0–37)
Albumin: 4.2 g/dL (ref 3.5–5.2)
Alkaline Phosphatase: 118 U/L — ABNORMAL HIGH (ref 39–117)
BUN: 23 mg/dL (ref 6–23)
CO2: 24 mEq/L (ref 19–32)
Calcium: 9.6 mg/dL (ref 8.4–10.5)
Chloride: 107 mEq/L (ref 96–112)
Creatinine, Ser: 1.45 mg/dL — ABNORMAL HIGH (ref 0.40–1.20)
GFR: 36.66 mL/min — ABNORMAL LOW (ref >60.00)
Glucose, Bld: 142 mg/dL — ABNORMAL HIGH (ref 70–99)
Potassium: 5.1 mEq/L (ref 3.5–5.1)
Sodium: 139 mEq/L (ref 135–145)
Total Bilirubin: 0.7 mg/dL (ref 0.2–1.2)
Total Protein: 7.1 g/dL (ref 6.0–8.3)

## 2017-07-27 LAB — TSH: TSH: 2.61 u[IU]/mL (ref 0.35–4.50)

## 2017-07-27 LAB — LIPID PANEL
Cholesterol: 121 mg/dL (ref 0–200)
HDL: 49.7 mg/dL (ref >39.00)
LDL Cholesterol: 41 mg/dL (ref 0–99)
NonHDL: 71.77
Total CHOL/HDL Ratio: 2
Triglycerides: 153 mg/dL — ABNORMAL HIGH (ref 0.0–149.0)
VLDL: 30.6 mg/dL (ref 0.0–40.0)

## 2017-07-27 LAB — HEMOGLOBIN A1C: Hgb A1c MFr Bld: 6.2 % (ref 4.6–6.5)

## 2017-07-27 LAB — URIC ACID: Uric Acid, Serum: 7.5 mg/dL — ABNORMAL HIGH (ref 2.4–7.0)

## 2017-07-27 NOTE — Progress Notes (Signed)
PCP notes:   Health maintenance:  Mammogram - PCP please address at next appt  Abnormal screenings:   None  Patient concerns:   None  Nurse concerns:  None  Next PCP appt:   08/03/17 @ 1100  I reviewed health advisor's note, was available for consultation, and agree with documentation and plan. Loura Pardon MD

## 2017-07-27 NOTE — Progress Notes (Signed)
Subjective:   Valerie Ramirez is a 84 y.o. female who presents for Medicare Annual (Subsequent) preventive examination.  Review of Systems: [name] Cardiac Risk Factors include: advanced age (>42men, >26 women);obesity (BMI >30kg/m2);hypertension     Objective:     Vitals: BP 140/82 (BP Location: Right Arm, Patient Position: Sitting, Cuff Size: Large)   Pulse (!) 59   Temp 97.9 F (36.6 C) (Oral)   Ht 5' 2.5" (1.588 m) Comment: no shoes  Wt 225 lb (102.1 kg)   SpO2 98%   BMI 40.50 kg/m   Body mass index is 40.5 kg/m.  Advanced Directives 07/27/2017 04/26/2017 04/04/2017 07/26/2016 04/26/2016 04/25/2016 04/23/2016  Does Patient Have a Medical Advance Directive? Yes No No Yes Yes Yes Yes  Type of Paramedic of [place];Living will - - [place];Living will [place];Living will [place];Living will Living will;Healthcare Power of Attorney  Does patient want to make changes to medical advance directive? - - - - No - Patient declined No - Patient declined -  Copy of [place] in Chart? No - copy requested - - No - copy requested No - copy requested No - copy requested -  Would patient like information on creating a medical advance directive? - No - Patient declined No - Patient declined - - - -  Pre-existing out of facility DNR order (yellow form or pink MOST form) - - - - - - -    Tobacco Social History   Tobacco Use  Smoking Status Never Smoker  Smokeless Tobacco Never Used     Counseling given: No   Clinical Intake:  Pre-visit preparation completed: Yes  Pain : No/denies pain Pain Score: 0-No pain     Nutritional Status: BMI > 30  Obese Nutritional Risks: None Diabetes: No  How often do you need to have someone help you when you read instructions, pamphlets, or other written materials from your doctor or pharmacy?: 1 - Never What is the last grade level you completed in  school?: 12th grade  Interpreter Needed?: No  Comments: pt is a widow and lives alone Information entered by :: LPinson, LPN  Past Medical History:  Diagnosis Date  . Arthritis    OA  . Cancer ([place])    endometrial  . GERD (gastroesophageal reflux disease)   . Hearing loss   . History of kidney stones   . Hyperlipidemia   . Hypertension   . Hypothyroid    "not in years"  . Shortness of breath dyspnea    04/25/17- not anymore   Past Surgical History:  Procedure Laterality Date  . ABDOMINAL HYSTERECTOMY    . ANTERIOR CERVICAL DECOMP/DISCECTOMY FUSION [name] 08/18/2015   Procedure: C5-6, C6-7 Anterior Cervical Discectomy and Fusion, Allograft, Plate;  Surgeon: Marybelle Killings, MD;  Location: [facility];  Service: Orthopedics;  Laterality: [name];  . bone spur removed Right    shoulder  . BOWEL RESECTION  07/04/2012   Procedure: SMALL BOWEL RESECTION;  Surgeon: Alvino Chapel, MD;  Location: [facility];  Service: Gynecology;;  . CHOLECYSTECTOMY    . CORONARY STENT INTERVENTION [name] 04/06/2017   Procedure: CORONARY STENT INTERVENTION;  Surgeon: Wellington Hampshire, MD;  Location: [facility];  Service: Cardiovascular;  Laterality: [name];  . DILATION AND CURETTAGE OF UTERUS  1999  . EYE SURGERY Bilateral    cataracts  . 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 HAND SURGERY  12/2008   after hand fx/due to fall  . JOINT REPLACEMENT Bilateral 07/1998   knees  . KNEE ARTHROPLASTY  05/23/1998   total right  . LAPAROTOMY Bilateral 07/04/2012   Procedure: EXPLORATORY LAPAROTOMY TOTAL ABDOMINAL HYSTERECTOMY BILATERAL SALPINGO-OOPHORECTOMY with small bowel resection ;  Surgeon: Alvino Chapel, MD;  Location: [facility];  Service: Gynecology;  Laterality: Bilateral;  . LEFT HEART CATH AND CORONARY ANGIOGRAPHY [name] 04/06/2017   Procedure: LEFT HEART  CATH AND CORONARY ANGIOGRAPHY;  Surgeon: Jolaine Artist, MD;  Location: [facility];  Service: Cardiovascular;  Laterality: [name];  . polyp removal  1999  . TUBAL LIGATION     Family History  Problem Relation Age of Onset  . Cancer Brother        LEUKEMIA  . Cancer Brother        LUNG  . Cancer Brother        STOMACH  . Heart disease Sister        CABG  . Stroke Sister   . Heart disease Brother        CABG   Social History   Socioeconomic History  . Marital status: Married    Spouse name: Not on file  . Number of children: Not on file  . Years of education: Not on file  . Highest education level: Not on file  Occupational History  . Not on file  Social Needs  . Financial resource strain: Not on file  . Food insecurity:    Worry: Not on file    Inability: Not on file  . Transportation needs:    Medical: Not on file    Non-medical: Not on file  Tobacco Use  . Smoking status: Never Smoker  . Smokeless tobacco: Never Used  Substance and Sexual Activity  . Alcohol use: No    Alcohol/week: 0.0 oz  . Drug use: No  . Sexual activity: Never  Lifestyle  . Physical activity:    Days per week: Not on file    Minutes per session: Not on file  . Stress: Not on file  Relationships  . Social connections:    Talks on phone: Not on file    Gets together: Not on file    Attends religious service: Not on file    Active member of club or organization: Not on file    Attends meetings of clubs or organizations: Not on file    Relationship status: Not on file  Other Topics Concern  . Not on file  Social History Narrative  . Not on file    Outpatient Encounter Medications as of 07/27/2017  Medication Sig  . acetaminophen (TYLENOL) 650 MG CR tablet Take 650 mg by mouth every 8 (eight) hours as needed for pain.  [name] amLODipine (NORVASC) 10 MG tablet Take 1 tablet (10 mg total) by mouth daily.  [name] aspirin 81 MG chewable tablet Chew 1 tablet (81 mg total) by mouth daily.    . carvedilol (COREG) 25 MG tablet Take 1 tablet (25 mg total) by mouth 2 (two) times daily with a meal.  . clopidogrel (PLAVIX) 75 MG tablet Take 1 tablet (75 mg total) by mouth daily with breakfast.  . colchicine 0.6 MG tablet Take 1 tablet (0.6 mg total) by mouth 2 (two) times daily.  . cyclobenzaprine (FLEXERIL) 10 MG  tablet Take 10 mg by mouth 3 (three) times daily as needed for muscle spasms.  [name] doxazosin (CARDURA) 2 MG tablet TAKE 1 TABLET (8 MG TOTAL) BY MOUTH AT BEDTIME.  . DULoxetine (CYMBALTA) 20 MG capsule TAKE 1 CAPSULE (20 MG TOTAL) BY MOUTH DAILY.  [name] ECHINACEA PO Take 760 mg by mouth 2 (two) times daily.  [name] HYDROcodone-acetaminophen (NORCO/VICODIN) 5-325 MG tablet Take 1-2 tablets by mouth every 6 (six) hours as needed.  . loperamide (IMODIUM) 2 MG capsule Take 1 capsule (2 mg total) by mouth as needed for diarrhea or loose stools.  [name] losartan (COZAAR) 25 MG tablet Take 1 tablet (25 mg total) by mouth daily.  . nitroGLYCERIN (NITROSTAT) 0.4 MG SL tablet Place 1 tablet (0.4 mg total) under the tongue every 5 (five) minutes x 3 doses as needed for chest pain.  [name] omega-3 acid ethyl esters (LOVAZA) 1 g capsule Take 1 capsule (1 g total) by mouth 2 (two) times daily.  . rosuvastatin (CRESTOR) 10 MG tablet Take 1 tablet (10 mg total) by mouth daily at 6 PM.  . traMADol (ULTRAM) 50 MG tablet Take 1 tablet (50 mg total) by mouth every 6 (six) hours as needed.  . vitamin B-12 (CYANOCOBALAMIN) 1000 MCG tablet Take 1,000 mcg by mouth daily.   No facility-administered encounter medications on file as of 07/27/2017.     Activities of Daily Living In your present state of health, do you have any difficulty performing the following activities: 07/27/2017 04/26/2017  Hearing? N N  Vision? N N  Difficulty concentrating or making decisions? N N  Walking or climbing stairs? N N  Comment SOB when walking long distances -  Dressing or bathing? N N  Doing errands, shopping? N -  Preparing Food and  eating ? N -  Using the Toilet? N -  In the past six months, have you accidently leaked urine? Y -  Do you have problems with loss of bowel control? N -  Managing your Medications? N -  Managing your Finances? N -  Housekeeping or managing your Housekeeping? N -  Some recent data might be hidden    Patient Care Team: Tower, Wynelle Fanny, MD as PCP - General (Family Medicine) Martinique, Peter M, MD as PCP - Cardiology (Cardiology)    Assessment:   This is a routine wellness examination for [place].  Hearing Screening Comments: Bilateral hearing aids Vision Screening Comments: Last vision in 2018 with Dr. Izell Glenwood   Exercise Activities and Dietary recommendations Current Exercise Habits: The patient does not participate in regular exercise at present, Exercise limited by: None identified  Goals    . DIET - INCREASE WATER INTAKE     Starting 07/27/2017, I will continue to drink 6-8 glasses of water daily.        Fall Risk Fall Risk  07/27/2017 07/26/2016 12/12/2015 08/12/2014 08/10/2013  Falls in the past year? No No Yes Yes No  Comment - - [facility]i Telephone Survey: data to providers prior to load - -  Number falls in past yr: - - 2 or more 2 or more -  Comment - - Emmi Telephone Survey Actual Response = 20 - -  Injury with Fall? - - No No -  Risk Factor Category  - - - High Fall Risk -  Risk for fall due to : - - - History of fall(s) -  Risk for fall due to: Comment - - - - -   IDepression Screen PHQ  2/9 Scores 07/27/2017 07/26/2016 08/12/2014 08/10/2013  PHQ - 2 Score 0 0 0 0  PHQ- 9 Score 0 - - -     Cognitive Function MMSE - Mini Mental State Exam 07/27/2017 07/26/2016  Orientation to time 5 5  Orientation to Place 5 5  Registration 3 3  Attention/ Calculation 0 0  Recall 3 3  Language- name 2 objects 0 0  Language- repeat 1 1  Language- follow 3 step command 3 3  Language- read & follow direction 0 0  Write a sentence 0 0  Copy design 0 0  Total score 20 20     PLEASE NOTE: A  Mini-Cog screen was completed. Maximum score is 20. A value of 0 denotes this part of [place] MMSE was not completed or the patient failed this part of the Mini-Cog screening.   Mini-Cog Screening Orientation to Time - Max 5 pts Orientation to Place - Max 5 pts Registration - Max 3 pts Recall - Max 3 pts Language Repeat - Max 1 pts Language Follow 3 Step Command - Max 3 pts     Immunization History  Administered Date(s) Administered  . Influenza Split 04/01/2011  . Influenza Whole 01/20/2012  . Influenza, High Dose Seasonal PF 01/29/2016  . Influenza,inj,Quad PF,6+ Mos 05/16/2015  . Influenza-Unspecified 02/15/2013, 02/28/2014, 03/03/2017  . Pneumococcal Conjugate-13 08/12/2014  . Pneumococcal Polysaccharide-23 07/07/2012  . Td 05/14/1997, 11/02/2011    Screening Tests Health Maintenance  Topic Date Due  . MAMMOGRAM  07/28/2018 (Originally 10/05/2014)  . INFLUENZA VACCINE  11/17/2017  . TETANUS/TDAP  11/01/2021  . DEXA SCAN  Completed  . PNA vac Low Risk Adult  Completed       Plan:     I have personally reviewed, addressed, and noted the following in the patient's chart:  A. Medical and social history B. Use of alcohol, tobacco or illicit drugs  C. Current medications and supplements D. Functional ability and status E.  Nutritional status F.  Physical activity G. Advance directives H. List of other physicians I.  Hospitalizations, surgeries, and ER visits in previous 12 months J.  [place] to include hearing, vision, cognitive, depression L. Referrals and appointments - none  In addition, I have reviewed and discussed with patient certain preventive protocols, quality metrics, and best practice recommendations. A written personalized care plan for preventive services as well as general preventive health recommendations were provided to patient.  See attached scanned questionnaire for additional information.   Signed,   Lindell Noe, MHA, BS,  LPN Health Coach

## 2017-07-27 NOTE — Patient Instructions (Signed)
[name] , Thank you for taking time to come for your Medicare Wellness Visit. I appreciate your ongoing commitment to your health goals. Please review the following plan we discussed and let me know if I can assist you in the future.   These are the goals we discussed: Goals    . DIET - INCREASE WATER INTAKE     Starting 07/27/2017, I will continue to drink 6-8 glasses of water daily.        This is a list of the screening recommended for you and due dates:  Health Maintenance  Topic Date Due  . Mammogram  07/28/2018*  . Flu Shot  11/17/2017  . Tetanus Vaccine  11/01/2021  . DEXA scan (bone density measurement)  Completed  . Pneumonia vaccines  Completed  *Topic was postponed. The date shown is not the original due date.   Preventive Care for Adults  A healthy lifestyle and preventive care can promote health and wellness. Preventive health guidelines for adults include the following key practices.  . A routine yearly physical is a good way to check with your health care provider about your health and preventive screening. It is a chance to share any concerns and updates on your health and to receive a thorough exam.  . Visit your dentist for a routine exam and preventive care every 6 months. Brush your teeth twice a day and floss once a day. Good oral hygiene prevents tooth decay and gum disease.  . The frequency of eye exams is based on your age, health, family medical history, use  of contact lenses, and other factors. Follow your health care provider's recommendations for frequency of eye exams.  . Eat a healthy diet. Foods like vegetables, fruits, whole grains, low-fat dairy products, and lean protein foods contain the nutrients you need without too many calories. Decrease your intake of foods high in solid fats, added sugars, and salt. Eat the right amount of calories for you. Get information about a proper diet from your health care provider, if necessary.  . Regular  physical exercise is one of the most important things you can do for your health. Most adults should get at least 150 minutes of moderate-intensity exercise (any activity that increases your heart rate and causes you to sweat) each week. In addition, most adults need muscle-strengthening exercises on 2 or more days a week.  Silver Sneakers may be a benefit available to you. To determine eligibility, you may visit the website: www.silversneakers.com or contact program at 437-426-5615 Mon-Fri between 8AM-8PM.   . Maintain a healthy weight. The body mass index (BMI) is a screening tool to identify possible weight problems. It provides an estimate of body fat based on height and weight. Your health care provider can find your BMI and can help you achieve or maintain a healthy weight.   For adults 20 years and older: ? A BMI below 18.5 is considered underweight. ? A BMI of 18.5 to 24.9 is normal. ? A BMI of 25 to 29.9 is considered overweight. ? A BMI of 30 and above is considered obese.   . Maintain normal blood lipids and cholesterol levels by exercising and minimizing your intake of saturated fat. Eat a balanced diet with plenty of fruit and vegetables. Blood tests for lipids and cholesterol should begin at age 34 and be repeated every 5 years. If your lipid or cholesterol levels are high, you are over 50, or you are at high risk for heart disease, you  may need your cholesterol levels checked more frequently. Ongoing high lipid and cholesterol levels should be treated with medicines if diet and exercise are not working.  . If you smoke, find out from your health care provider how to quit. If you do not use tobacco, please do not start.  . If you choose to drink alcohol, please do not consume more than 2 drinks per day. One drink is considered to be 12 ounces (355 mL) of beer, 5 ounces (148 mL) of wine, or 1.5 ounces (44 mL) of liquor.  . If you are 43-37 years old, ask your health care provider if  you should take aspirin to prevent strokes.  . Use sunscreen. Apply sunscreen liberally and repeatedly throughout the day. You should seek shade when your shadow is shorter than you. Protect yourself by wearing long sleeves, pants, a wide-brimmed hat, and sunglasses year round, whenever you are outdoors.  . Once a month, do a whole body skin exam, using a mirror to look at the skin on your back. Tell your health care provider of new moles, moles that have irregular borders, moles that are larger than a pencil eraser, or moles that have changed in shape or color.

## 2017-08-03 ENCOUNTER — Ambulatory Visit (INDEPENDENT_AMBULATORY_CARE_PROVIDER_SITE_OTHER): Payer: Medicare Other | Admitting: Family Medicine

## 2017-08-03 ENCOUNTER — Encounter: Payer: Self-pay | Admitting: Family Medicine

## 2017-08-03 VITALS — BP 138/76 | HR 66 | Temp 97.8°F | Ht 62.5 in | Wt 225.0 lb

## 2017-08-03 DIAGNOSIS — R739 Hyperglycemia, unspecified: Secondary | ICD-10-CM

## 2017-08-03 DIAGNOSIS — R740 Nonspecific elevation of levels of transaminase and lactic acid dehydrogenase [LDH]: Secondary | ICD-10-CM

## 2017-08-03 DIAGNOSIS — N183 Chronic kidney disease, stage 3 unspecified: Secondary | ICD-10-CM

## 2017-08-03 DIAGNOSIS — I2510 Atherosclerotic heart disease of native coronary artery without angina pectoris: Secondary | ICD-10-CM | POA: Diagnosis not present

## 2017-08-03 DIAGNOSIS — D649 Anemia, unspecified: Secondary | ICD-10-CM | POA: Diagnosis not present

## 2017-08-03 DIAGNOSIS — I10 Essential (primary) hypertension: Secondary | ICD-10-CM | POA: Diagnosis not present

## 2017-08-03 DIAGNOSIS — E039 Hypothyroidism, unspecified: Secondary | ICD-10-CM

## 2017-08-03 DIAGNOSIS — E7800 Pure hypercholesterolemia, unspecified: Secondary | ICD-10-CM

## 2017-08-03 DIAGNOSIS — M1A9XX0 Chronic gout, unspecified, without tophus (tophi): Secondary | ICD-10-CM | POA: Diagnosis not present

## 2017-08-03 DIAGNOSIS — R7401 Elevation of levels of liver transaminase levels: Secondary | ICD-10-CM | POA: Insufficient documentation

## 2017-08-03 MED ORDER — COLCHICINE 0.6 MG PO TABS: 0.6000 mg | ORAL_TABLET | Freq: Two times a day (BID) | ORAL | 11 refills | Status: DC

## 2017-08-03 MED ORDER — DULOXETINE HCL 20 MG PO CPEP: ORAL_CAPSULE | ORAL | 3 refills | Status: DC

## 2017-08-03 NOTE — Patient Instructions (Addendum)
Please take 1000 iu of vitamin D3  Over the counter daily - for bone health   I do recommend the shingles vaccine (Shingrix) -check back with the pharmacy   Hold the losartan and ask Dr Candiss Norse what Deloris Ping any he wants to subs  Also discuss with Dr Martinique   Also liver numbers are slightly up - perhaps related to crestor but sure?  Discuss with Dr Martinique also   To prevent diabetes Try to get most of your carbohydrates from produce (with the exception of white potatoes)  Eat less bread/pasta/rice/snack foods/cereals/sweets and other items from the middle of the grocery store (processed carbs)  Do not over-take tylenol (no more than 2 every 6 hours) -due to liver function

## 2017-08-03 NOTE — Progress Notes (Signed)
Subjective:    Patient ID: Valerie Ramirez, female    DOB: Dec 29, 1932, 84 y.o.   MRN: [id number]  HPI Here for f/u of chronic health problems   Cannot hear well today- her hearing aide is not working today    [employer] from Last 3 Encounters:  08/03/17 225 lb (102.1 kg)  07/27/17 225 lb (102.1 kg)  06/15/17 229 lb 4.8 oz (104 kg)  down 4 lb  40.50 kg/m   Had amw on 4/10 No concerns  Mammogram 6/15 nl - declines because she would not want to to treat breast cancer if she had it  Self breast exam - no lumps   dexa 6/15 normal BMD Had a fall yesterday - got a bad cramp in her leg after walking / and she fell forward but did not hurt herself  Uses a walker  No fractures  Not taking vitamin D  Zoster status -has not had shingrix    Colon screen 5/16 neg ifob Declines further colon cancer screening -declines   bp is stable today  Sees cardiology next week  No cp or palpitations or headaches or edema  No side effects to medicines  BP Readings from Last 3 Encounters:  08/03/17 138/76  07/27/17 140/82  06/15/17 (!) 171/80   takes amlodipine 10 mg  Coreg 25 mg  cardura 2 mg Losartan 25 mg (got a letter that her losartan was recalled)     Lab Results  Component Value Date   CREATININE 1.45 (H) 07/27/2017   BUN 23 07/27/2017   NA 139 07/27/2017   K 5.1 07/27/2017   CL 107 07/27/2017   CO2 24 07/27/2017   Sees renal for CKD This is improved  Lab Results  Component Value Date   ALT 40 (H) 07/27/2017   AST 39 (H) 07/27/2017   ALKPHOS 118 (H) 07/27/2017   BILITOT 0.7 07/27/2017    Lab Results  Component Value Date   WBC 5.8 07/27/2017   HGB 10.9 (L) 07/27/2017   HCT 32.8 (L) 07/27/2017   MCV 86.8 07/27/2017   PLT 181.0 07/27/2017    Was on abx for bladder infection - with levaquin     Hypothyroidism  Pt has no clinical changes No change in energy level/ hair or skin/ edema and no tremor Lab Results  Component Value Date   TSH 2.61 07/27/2017     H/o gout  Uses colchicine prn  Intol of allopurinol Lab Results  Component Value Date   LABURIC 7.5 (H) 07/27/2017  no problems - now taking the colchicine every day (per renal)     Hyperlipidemia Lab Results  Component Value Date   CHOL 121 07/27/2017   CHOL 123 05/18/2017   CHOL 222 (H) 04/06/2017   Lab Results  Component Value Date   HDL 49.70 07/27/2017   HDL 52 05/18/2017   HDL 49 04/06/2017   Lab Results  Component Value Date   LDLCALC 41 07/27/2017   LDLCALC 28 05/18/2017   LDLCALC 140 (H) 04/06/2017   Lab Results  Component Value Date   TRIG 153.0 (H) 07/27/2017   TRIG 214 (H) 05/18/2017   TRIG 165 (H) 04/06/2017   Lab Results  Component Value Date   CHOLHDL 2 07/27/2017   CHOLHDL 2.4 05/18/2017   CHOLHDL 4.5 04/06/2017   Lab Results  Component Value Date   LDLDIRECT 136.0 10/31/2015   LDLDIRECT 115.6 07/28/2012   LDLDIRECT 139.5 10/05/2010  improved with low dose crestor  Also  lovaza  Eating healthier as well    Hx of anemia  Lab Results  Component Value Date   WBC 5.8 07/27/2017   HGB 10.9 (L) 07/27/2017   HCT 32.8 (L) 07/27/2017   MCV 86.8 07/27/2017   PLT 181.0 07/27/2017   This is stable and wbc is better   Hyperglycemia  Lab Results  Component Value Date   HGBA1C 6.2 07/27/2017  this is up from 6.0 Now she is eating better however and lost 5 lb    Patient Active Problem List   Diagnosis Date Noted  . Elevated transaminase level 08/03/2017  . Radial artery injury, left, sequela 06/15/2017  . H/O: CVA (cerebrovascular accident) 05/26/2017  . Sick sinus syndrome ([place]) 05/25/2017  . Unstable angina ([place])   . Exertional chest pain 04/05/2017  . CKD (chronic kidney disease), stage III ([place]) 04/05/2017  . Normocytic normochromic anemia 04/05/2017  . Thoracic back pain 09/15/2016  . History of radius fracture 06/01/2016  . AKI (acute kidney injury) ([place]) 04/25/2016  . Status post cervical spinal fusion 08/18/2015  .  Osteoarthritis, hip, bilateral 08/12/2014  . Hearing loss in right ear 06/12/2014  . Colon cancer screening 08/10/2013  . Seborrheic keratoses, inflamed 08/10/2013  . History of fall 04/10/2013  . Encounter for Medicare annual wellness exam 08/08/2012  . Gout 07/14/2012  . History of endometrial cancer 05/25/2012  . Degenerative joint disease of cervical spine 05/03/2011  . Other screening mammogram 03/31/2011  . Hyperglycemia 09/26/2007  . Morbid obesity ([place]) 07/12/2007  . Essential hypertension 07/11/2007  . History of cardiomyopathy 07/11/2007  . Osteoarthritis 07/11/2007  . 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 Hypothyroidism 10/05/2006  . HYPERCHOLESTEROLEMIA, PURE 10/05/2006   Past Medical History:  Diagnosis Date  . Arthritis    OA  . Cancer ([place])    endometrial  . GERD (gastroesophageal reflux disease)   . Hearing loss   . History of kidney stones   . Hyperlipidemia   . Hypertension   . Hypothyroid    "not in years"  . Shortness of breath dyspnea    04/25/17- not anymore   Past Surgical History:  Procedure Laterality Date  . ABDOMINAL HYSTERECTOMY    . ANTERIOR CERVICAL DECOMP/DISCECTOMY FUSION [name] 08/18/2015   Procedure: C5-6, C6-7 Anterior Cervical Discectomy and Fusion, Allograft, Plate;  Surgeon: Marybelle Killings, MD;  Location: [facility];  Service: Orthopedics;  Laterality: [name];  . bone spur removed Right    shoulder  . BOWEL RESECTION  07/04/2012   Procedure: SMALL BOWEL RESECTION;  Surgeon: Alvino Chapel, MD;  Location: [facility];  Service: Gynecology;;  . CHOLECYSTECTOMY    . CORONARY STENT INTERVENTION [name] 04/06/2017   Procedure: CORONARY STENT INTERVENTION;  Surgeon: Wellington Hampshire, MD;  Location: [facility];  Service: Cardiovascular;  Laterality: [name];  . DILATION AND CURETTAGE OF UTERUS  1999  . EYE SURGERY Bilateral    cataracts  . FALSE ANEURYSM REPAIR Right 04/26/2017   Procedure: 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;   Surgeon: Conrad Roanoke, MD;  Location: [facility];  Service: Vascular;  Laterality: Right;  . HAND SURGERY  12/2008   after hand fx/due to fall  . JOINT REPLACEMENT Bilateral 07/1998   knees  . KNEE ARTHROPLASTY  05/23/1998   total right  . LAPAROTOMY Bilateral 07/04/2012   Procedure: EXPLORATORY LAPAROTOMY TOTAL ABDOMINAL HYSTERECTOMY BILATERAL SALPINGO-OOPHORECTOMY with small bowel resection ;  Surgeon: Alvino Chapel, MD;  Location: [facility];  Service: Gynecology;  Laterality: Bilateral;  . LEFT HEART CATH AND CORONARY ANGIOGRAPHY [name] 04/06/2017   Procedure: LEFT HEART CATH AND CORONARY ANGIOGRAPHY;  Surgeon: Jolaine Artist, MD;  Location: [facility];  Service: Cardiovascular;  Laterality: [name];  . polyp removal  1999  . TUBAL LIGATION     Social History   Tobacco Use  . Smoking status: Never Smoker  . Smokeless tobacco: Never Used  Substance Use Topics  . Alcohol use: No    Alcohol/week: 0.0 oz  . Drug use: No   Family History  Problem Relation Age of Onset  . Cancer Brother        LEUKEMIA  . Cancer Brother        LUNG  . Cancer Brother        STOMACH  . Heart disease Sister        CABG  . Stroke Sister   . Heart disease Brother        CABG   Allergies  Allergen Reactions  . Allopurinol Nausea Only  . Lipitor [Atorvastatin Calcium] Other (See Comments)    Leg ache and ache all over  . Simvastatin Other (See Comments)    REACTION: muscle pain  . Tape Itching and Rash    "Cannot use paper tape" --- also causes blisters. Use plastic tape   Current Outpatient Medications on File Prior to Visit  Medication Sig Dispense Refill  . acetaminophen (TYLENOL) 650 MG CR tablet Take 650 mg by mouth every 8 (eight) hours as needed for pain.    [name] amLODipine (NORVASC) 10 MG tablet Take 1 tablet (10 mg total) by mouth daily. 30 tablet 11  . aspirin 81 MG chewable tablet Chew 1 tablet (81 mg total) by mouth daily.    . carvedilol (COREG) 25 MG tablet Take 1 tablet  (25 mg total) by mouth 2 (two) times daily with a meal. 60 tablet 11  . clopidogrel (PLAVIX) 75 MG tablet Take 1 tablet (75 mg total) by mouth daily with breakfast. 30 tablet 11  . cyclobenzaprine (FLEXERIL) 10 MG tablet Take 10 mg by mouth 3 (three) times daily as needed for muscle spasms.    [name] doxazosin (CARDURA) 2 MG tablet TAKE 1 TABLET (8 MG TOTAL) BY MOUTH AT BEDTIME. 30 tablet 5  . ECHINACEA PO Take 760 mg by mouth 2 (two) times daily.    [name] HYDROcodone-acetaminophen (NORCO/VICODIN) 5-325 MG tablet Take 1-2 tablets by mouth every 6 (six) hours as needed. 5 tablet 0  . loperamide (IMODIUM) 2 MG capsule Take 1 capsule (2 mg total) by mouth as needed for diarrhea or loose stools. 20 capsule 0  . losartan (COZAAR) 25 MG tablet Take 1 tablet (25 mg total) by mouth daily. 30 tablet 11  . nitroGLYCERIN (NITROSTAT) 0.4 MG SL tablet Place 1 tablet (0.4 mg total) under the tongue every 5 (five) minutes x 3 doses as needed for chest pain. 25 tablet 12  . omega-3 acid ethyl esters (LOVAZA) 1 g capsule Take 1 capsule (1 g total) by mouth 2 (two) times daily. 60 capsule 4  . rosuvastatin (CRESTOR) 10 MG tablet Take 1 tablet (10 mg total) by mouth daily at 6 PM. 30 tablet 11  . traMADol (ULTRAM) 50 MG tablet Take 1 tablet (50 mg total) by mouth every 6 (six) hours as needed. 12 tablet 0  . vitamin B-12 (CYANOCOBALAMIN) 1000 MCG tablet Take 1,000 mcg by mouth daily.     No current facility-administered  medications on file prior to visit.      Review of Systems  Constitutional: Negative for activity change, appetite change, fatigue, fever and unexpected weight change.  HENT: Negative for congestion, ear pain, rhinorrhea, sinus pressure and sore throat.   Eyes: Negative for pain, redness and visual disturbance.  Respiratory: Negative for cough, shortness of breath and wheezing.   Cardiovascular: Negative for chest pain and palpitations.  Gastrointestinal: Negative for abdominal pain, blood in stool,  constipation and diarrhea.  Endocrine: Negative for polydipsia and polyuria.  Genitourinary: Negative for dysuria, frequency and urgency.  Musculoskeletal: Positive for arthralgias. Negative for back pain and myalgias.  Skin: Negative for pallor and rash.  Allergic/Immunologic: Negative for environmental allergies.  Neurological: Negative for dizziness, syncope and headaches.  Hematological: Negative for adenopathy. Does not bruise/bleed easily.  Psychiatric/Behavioral: Negative for decreased concentration and dysphoric mood. The patient is not nervous/anxious.        Objective:   Physical Exam  Constitutional: She appears well-developed and well-nourished. No distress.  obese and well appearing   HENT:  Head: Normocephalic and atraumatic.  Right Ear: External ear normal.  Left Ear: External ear normal.  Mouth/Throat: Oropharynx is clear and moist.  Eyes: Pupils are equal, round, and reactive to light. Conjunctivae and EOM are normal. No scleral icterus.  Neck: Normal range of motion. Neck supple. No JVD present. Carotid bruit is not present. No thyromegaly present.  Cardiovascular: Normal rate, regular rhythm, normal heart sounds and intact distal pulses. Exam reveals no gallop.  Pulmonary/Chest: Effort normal and breath sounds normal. No respiratory distress. She has no wheezes. She exhibits no tenderness. No breast swelling, tenderness, discharge or bleeding.  Abdominal: Soft. Bowel sounds are normal. She exhibits no distension, no abdominal bruit and no mass. There is no tenderness.  Genitourinary: No breast swelling, tenderness, discharge or bleeding.  Genitourinary Comments: Declines breast exam  Musculoskeletal: Normal range of motion. She exhibits no edema or tenderness.  Limited rom of joints and neck  Lymphadenopathy:    She has no cervical adenopathy.  Neurological: She is alert. She has normal reflexes. No cranial nerve deficit. She exhibits normal muscle tone.  Coordination normal.  Skin: Skin is warm and dry. No rash noted. No erythema. No pallor.  Solar lentigines diffusely  Also sks  Psychiatric: She has a normal mood and affect.  cheerful          Assessment & Plan:   Problem List Items Addressed This Visit      Cardiovascular and Mediastinum   Essential hypertension - Primary    bp in fair control at this time  BP Readings from Last 1 Encounters:  08/03/17 138/76   No changes needed Disc lifstyle change with low sodium diet and exercise  Losartan was recalled  Will disc subs with cardiology at upcoming appt Lab rev        Endocrine   Hypothyroidism    Hypothyroidism  Pt has no clinical changes No change in energy level/ hair or skin/ edema and no tremor Lab Results  Component Value Date   TSH 2.61 07/27/2017            Genitourinary   CKD (chronic kidney disease), stage III ([place])    Continues renal f/u  Avoid nsaids Water intake discussed          Other   Elevated transaminase level    Poss from crestor or tylenol or fatty liver Continue to follow  No symptoms  Gout    Doing well with reg dosed colchicine Lab Results  Component Value Date   LABURIC 7.5 (H) 07/27/2017   Intol of allopurinol  Renal doc aware       HYPERCHOLESTEROLEMIA, PURE    Disc goals for lipids and reasons to control them Rev labs with pt Rev low sat fat diet in detail On crestor and diet  Watching LFTs      Hyperglycemia    Lab Results  Component Value Date   HGBA1C 6.2 07/27/2017   Up a bit disc imp of low glycemic diet and wt loss to prevent DM2       Morbid obesity ([facility])    Discussed how this problem influences overall health and the risks it imposes  Reviewed plan for weight loss with lower calorie diet (via better food choices and also portion control or program like weight watchers) and exercise building up to or more than 30 minutes 5 days per week including some aerobic activity          Normocytic normochromic anemia    Suspect rel to renal dz/chronic dz  No changes in lab or clinically  Continue to follow

## 2017-08-04 DIAGNOSIS — R600 Localized edema: Secondary | ICD-10-CM | POA: Diagnosis not present

## 2017-08-04 DIAGNOSIS — N183 Chronic kidney disease, stage 3 (moderate): Secondary | ICD-10-CM | POA: Diagnosis not present

## 2017-08-04 DIAGNOSIS — I10 Essential (primary) hypertension: Secondary | ICD-10-CM | POA: Diagnosis not present

## 2017-08-04 NOTE — Assessment & Plan Note (Signed)
Suspect rel to renal dz/chronic dz  No changes in lab or clinically  Continue to follow

## 2017-08-04 NOTE — Assessment & Plan Note (Signed)
Doing well with reg dosed colchicine Lab Results  Component Value Date   LABURIC 7.5 (H) 07/27/2017   Intol of allopurinol  Renal doc aware

## 2017-08-04 NOTE — Assessment & Plan Note (Signed)
Hypothyroidism  Pt has no clinical changes No change in energy level/ hair or skin/ edema and no tremor Lab Results  Component Value Date   TSH 2.61 07/27/2017

## 2017-08-04 NOTE — Assessment & Plan Note (Signed)
bp in fair control at this time  BP Readings from Last 1 Encounters:  08/03/17 138/76   No changes needed Disc lifstyle change with low sodium diet and exercise  Losartan was recalled  Will disc subs with cardiology at upcoming appt Lab rev

## 2017-08-04 NOTE — Assessment & Plan Note (Signed)
Poss from crestor or tylenol or fatty liver Continue to follow  No symptoms

## 2017-08-04 NOTE — Assessment & Plan Note (Signed)
Discussed how this problem influences overall health and the risks it imposes  Reviewed plan for weight loss with lower calorie diet (via better food choices and also portion control or program like weight watchers) and exercise building up to or more than 30 minutes 5 days per week including some aerobic activity    

## 2017-08-04 NOTE — Assessment & Plan Note (Signed)
Lab Results  Component Value Date   HGBA1C 6.2 07/27/2017   Up a bit disc imp of low glycemic diet and wt loss to prevent DM2

## 2017-08-04 NOTE — Assessment & Plan Note (Signed)
Disc goals for lipids and reasons to control them Rev labs with pt Rev low sat fat diet in detail On crestor and diet  Watching LFTs

## 2017-08-04 NOTE — Assessment & Plan Note (Signed)
Continues renal f/u  Avoid nsaids Water intake discussed

## 2017-08-08 DIAGNOSIS — H6121 Impacted cerumen, right ear: Secondary | ICD-10-CM | POA: Diagnosis not present

## 2017-08-08 DIAGNOSIS — H903 Sensorineural hearing loss, bilateral: Secondary | ICD-10-CM | POA: Diagnosis not present

## 2017-08-14 NOTE — Progress Notes (Signed)
Cardiology Office Note    Date:  08/15/2017   ID:  Valerie Ramirez, DOB 01/19/1933, MRN [id number]  PCP:  Abner Greenspan, MD  Cardiologist:  Dr. Martinique  Chief Complaint  Patient presents with  . Follow-up    pt denied chest pain, pt stated one of her BP medication cause her to feel dizzy and lightheaded in the morning    History of Present Illness:  Valerie Ramirez is a 84 y.o. female with PMH of HTN, HLD, hypothyroidism and CAD.  Echocardiogram obtained on 04/05/2017 showed EF 55-60%, grade 2 DD, PA peak pressure 34 mmHg.  She was ruled in for NSTEMI.  She eventually underwent cardiac catheterization on 04/06/2017 which showed a 95% mid left circumflex lesion treated with 3.5 x 28 mm DES, there was a 40% ostial to proximal LAD residual and to 60% proximal RCA residual.  Post procedure, patient was placed on dual antiplatelet therapy.  Due to her intolerance of Lipitor and simvastatin, she was started on low-dose Crestor.    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 Bridgett Larsson on 04/26/2017.  Daughter at the time also mentioned some mental status change immediately after the discharge, they did not seek medical attention at the time.    MRI unfortunately does show punctate acute/early subacute microvascular infarct in the right anterior caudate body, chronic right occipital infarction, small chronic lacunar infarct in the right putamen extending into the mid corona radiata.  .  On follow up today she is seen with her daughter. She is feeling well. She has been active in her garden and yard. She denies any chest pain or SOB. Her daughter notes a little confusion at times but overall is doing well with little residual from her stroke. Her losartan has been recalled. She does note some dizziness a couple hours after taking her morning pills.    Past Medical History:  Diagnosis Date  . Arthritis    OA  .  Cancer ([place])    endometrial  . GERD (gastroesophageal reflux disease)   . Hearing loss   . History of kidney stones   . Hyperlipidemia   . Hypertension   . Hypothyroid    "not in years"  . Shortness of breath dyspnea    04/25/17- not anymore    Past Surgical History:  Procedure Laterality Date  . ABDOMINAL HYSTERECTOMY    . ANTERIOR CERVICAL DECOMP/DISCECTOMY FUSION [name] 08/18/2015   Procedure: C5-6, C6-7 Anterior Cervical Discectomy and Fusion, Allograft, Plate;  Surgeon: Marybelle Killings, MD;  Location: [facility];  Service: Orthopedics;  Laterality: [name];  . bone spur removed Right    shoulder  . BOWEL RESECTION  07/04/2012   Procedure: SMALL BOWEL RESECTION;  Surgeon: Alvino Chapel, MD;  Location: [facility];  Service: Gynecology;;  . CHOLECYSTECTOMY    . CORONARY STENT INTERVENTION [name] 04/06/2017   Procedure: CORONARY STENT INTERVENTION;  Surgeon: Wellington Hampshire, MD;  Location: [facility];  Service: Cardiovascular;  Laterality: [name];  . DILATION AND CURETTAGE OF UTERUS  1999  . EYE SURGERY Bilateral    cataracts  . FALSE ANEURYSM REPAIR Right 04/26/2017   Procedure: 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;  Surgeon: Conrad Pine Lawn, MD;  Location: [facility];  Service: Vascular;  Laterality: Right;  . HAND SURGERY  12/2008   after hand fx/due to fall  . JOINT  REPLACEMENT Bilateral 07/1998   knees  . KNEE ARTHROPLASTY  05/23/1998   total right  . LAPAROTOMY Bilateral 07/04/2012   Procedure: EXPLORATORY LAPAROTOMY TOTAL ABDOMINAL HYSTERECTOMY BILATERAL SALPINGO-OOPHORECTOMY with small bowel resection ;  Surgeon: Alvino Chapel, MD;  Location: [facility];  Service: Gynecology;  Laterality: Bilateral;  . LEFT HEART CATH AND CORONARY ANGIOGRAPHY [name] 04/06/2017   Procedure: LEFT HEART CATH AND CORONARY ANGIOGRAPHY;  Surgeon: Jolaine Artist, MD;  Location: [facility];  Service: Cardiovascular;  Laterality: [name];  . polyp removal  1999  . TUBAL LIGATION       Current Medications: Outpatient Medications Prior to Visit  Medication Sig Dispense Refill  . acetaminophen (TYLENOL) 650 MG CR tablet Take 650 mg by mouth every 8 (eight) hours as needed for pain.    [name] amLODipine (NORVASC) 10 MG tablet Take 1 tablet (10 mg total) by mouth daily. 30 tablet 11  . aspirin 81 MG chewable tablet Chew 1 tablet (81 mg total) by mouth daily.    . carvedilol (COREG) 25 MG tablet Take 1 tablet (25 mg total) by mouth 2 (two) times daily with a meal. 60 tablet 11  . clopidogrel (PLAVIX) 75 MG tablet Take 1 tablet (75 mg total) by mouth daily with breakfast. 30 tablet 11  . colchicine 0.6 MG tablet Take 1 tablet (0.6 mg total) by mouth 2 (two) times daily. 60 tablet 11  . cyclobenzaprine (FLEXERIL) 10 MG tablet Take 10 mg by mouth 3 (three) times daily as needed for muscle spasms.    [name] doxazosin (CARDURA) 2 MG tablet TAKE 1 TABLET (8 MG TOTAL) BY MOUTH AT BEDTIME. 30 tablet 5  . DULoxetine (CYMBALTA) 20 MG capsule TAKE 1 CAPSULE (20 MG TOTAL) BY MOUTH DAILY. 90 capsule 3  . ECHINACEA PO Take 760 mg by mouth 2 (two) times daily.    [name] HYDROcodone-acetaminophen (NORCO/VICODIN) 5-325 MG tablet Take 1-2 tablets by mouth every 6 (six) hours as needed. 5 tablet 0  . loperamide (IMODIUM) 2 MG capsule Take 1 capsule (2 mg total) by mouth as needed for diarrhea or loose stools. 20 capsule 0  . nitroGLYCERIN (NITROSTAT) 0.4 MG SL tablet Place 1 tablet (0.4 mg total) under the tongue every 5 (five) minutes x 3 doses as needed for chest pain. 25 tablet 12  . omega-3 acid ethyl esters (LOVAZA) 1 g capsule Take 1 capsule (1 g total) by mouth 2 (two) times daily. 60 capsule 4  . rosuvastatin (CRESTOR) 10 MG tablet Take 1 tablet (10 mg total) by mouth daily at 6 PM. 30 tablet 11  . traMADol (ULTRAM) 50 MG tablet Take 1 tablet (50 mg total) by mouth every 6 (six) hours as needed. 12 tablet 0  . vitamin B-12 (CYANOCOBALAMIN) 1000 MCG tablet Take 1,000 mcg by mouth daily.    [name] losartan  (COZAAR) 25 MG tablet Take 1 tablet (25 mg total) by mouth daily. 30 tablet 11   No facility-administered medications prior to visit.      Allergies:   Allopurinol; Lipitor [atorvastatin calcium]; Simvastatin; and Tape   Social History   Socioeconomic History  . Marital status: Married    Spouse name: Not on file  . Number of children: Not on file  . Years of education: Not on file  . Highest education level: Not on file  Occupational History  . Not on file  Social Needs  . Financial resource strain: Not on file  . Food insecurity:  Worry: Not on file    Inability: Not on file  . Transportation needs:    Medical: Not on file    Non-medical: Not on file  Tobacco Use  . Smoking status: Never Smoker  . Smokeless tobacco: Never Used  Substance and Sexual Activity  . Alcohol use: No    Alcohol/week: 0.0 oz  . Drug use: No  . Sexual activity: Never  Lifestyle  . Physical activity:    Days per week: Not on file    Minutes per session: Not on file  . Stress: Not on file  Relationships  . Social connections:    Talks on phone: Not on file    Gets together: Not on file    Attends religious service: Not on file    Active member of club or organization: Not on file    Attends meetings of clubs or organizations: Not on file    Relationship status: Not on file  Other Topics Concern  . Not on file  Social History Narrative  . Not on file     Family History:  The patient's family history includes Cancer in her brother, brother, and brother; Heart disease in her brother and sister; Stroke in her sister.   ROS:   Please see the history of present illness.    ROS All other systems reviewed and are negative.   PHYSICAL EXAM:   VS:  BP 135/69   Pulse 69   Ht 5' 2.5" (1.588 m)   Wt 224 lb 3.2 oz (101.7 kg)   BMI 40.35 kg/m    GENERAL:  Well appearing obese WF in NAD HEENT:  PERRL, EOMI, sclera are clear. Oropharynx is clear. NECK:  No jugular venous distention, carotid  upstroke brisk and symmetric, no bruits, no thyromegaly or adenopathy LUNGS:  Clear to auscultation bilaterally CHEST:  Unremarkable HEART:  RRR,  PMI not displaced or sustained,S1 and S2 within normal limits, no S3, no S4: no clicks, no rubs, no murmurs ABD:  Soft, nontender. BS +, no masses or bruits. No hepatomegaly, no splenomegaly EXT:  2 + pulses throughout, no edema, no cyanosis no clubbing SKIN:  Warm and dry.  No rashes NEURO:  Alert and oriented x 3. Cranial nerves II through XII intact. PSYCH:  Cognitively intact    Wt Readings from Last 3 Encounters:  08/15/17 224 lb 3.2 oz (101.7 kg)  08/03/17 225 lb (102.1 kg)  07/27/17 225 lb (102.1 kg)      Studies/Labs Reviewed:   EKG:  EKG is not ordered today.   Recent Labs: 07/27/2017: ALT 40; BUN 23; Creatinine, Ser 1.45; Hemoglobin 10.9; Platelets 181.0; Potassium 5.1; Sodium 139; TSH 2.61   Lipid Panel    Component Value Date/Time   CHOL 121 07/27/2017 1118   CHOL 123 05/18/2017 1154   TRIG 153.0 (H) 07/27/2017 1118   HDL 49.70 07/27/2017 1118   HDL 52 05/18/2017 1154   CHOLHDL 2 07/27/2017 1118   VLDL 30.6 07/27/2017 1118   LDLCALC 41 07/27/2017 1118   LDLCALC 28 05/18/2017 1154   LDLDIRECT 136.0 10/31/2015 0959    Additional studies/ records that were reviewed today include:   Cath 04/06/2017 Conclusion     Mid Cx lesion is 95% stenosed.  A drug-eluting stent was successfully placed using a STENT SVELTE RX 3.50 X 28MM.  Post intervention, there is a 0% residual stenosis.  Successful angioplasty and drug-eluting stent placement to the mid left circumflex.    Echo 04/05/2017 LV EF:  55% - 60%  Study Conclusions  - Left ventricle: The cavity size was normal. Wall thickness was increased in a pattern of severe LVH. Systolic function was normal. The estimated ejection fraction was in the range of 55% to 60%. Wall motion was normal; there were no regional wall motion abnormalities.  Doppler parameters are consistent with pseudonormal left ventricular relaxation (grade 2 diastolic dysfunction). The E/e&' ratio is >15, suggesting elevated LV filling pressure. - Aortic valve: Sclerosis without stenosis. Transvalvular velocity was minimally increased. Mean gradient (S): 7 mm Hg. - Mitral valve: Mildly thickened leaflets . There was trivial regurgitation. - Left atrium: Moderately dilated. - Tricuspid valve: There was mild regurgitation. - Pulmonary arteries: PA peak pressure: 34 mm Hg (S). - Inferior vena cava: The vessel was normal in size. The respirophasic diameter changes were in the normal range (>= 50%), consistent with normal central venous pressure.  Impressions:  - LVEF 55-60%, severe LVH, normal wall motion, grade 2 DD with elevated LV filling pressure, aortic valve sclerosis, trivial MR, moderate LAE, mild TR, RVSP 34 mmHg, normal IVC.    ASSESSMENT:    1. Coronary artery disease involving native coronary artery of native heart without angina pectoris   2. Essential hypertension   3. Mixed hyperlipidemia      PLAN:  In order of problems listed above:  1. CAD: s/p DES of LCx for NSTEMI in Dec. 2018. Compliant with dual antiplatelet therapy, continue aspirin, Plavix, carvedilol, and Crestor.  2. Hyperlipidemia: On Crestor 10 mg and tolerating well. Lipids look excellent.   3. Hypertension: Blood pressure is well controlled. Will switch losartan to lisinopril 5 mg daily due to recall.  4. Pseudoaneurysm of the right radial artery: Noted post cardiac catheterization, underwent vascular repair by Dr. Bridgett Larsson  5.  CVA: Confirmed on MRI, noted by daughter to have some altered mental status after last cardiac catheterization.  However MRI also showed some chronic infarct as well in the occipital lobe.  Clinically improved.  6. Hypothyroidism: Managed by primary care provider.       Medication Adjustments/Labs and Tests  Ordered: Current medicines are reviewed at length with the patient today.  Concerns regarding medicines are outlined above.  Medication changes, Labs and Tests ordered today are listed in the Patient Instructions below. Patient Instructions  Continue your current therapy except we will switch losartan to lisinopril 5 mg daily  I will see you in 6 months      Signed, Peter Martinique, MD  08/15/2017 10:55 AM    [facility] [address], [place], [place]  [address] Phone: [phone]; Fax: [phone]

## 2017-08-15 ENCOUNTER — Ambulatory Visit (INDEPENDENT_AMBULATORY_CARE_PROVIDER_SITE_OTHER): Payer: Medicare Other | Admitting: Cardiology

## 2017-08-15 ENCOUNTER — Encounter: Payer: Self-pay | Admitting: Cardiology

## 2017-08-15 VITALS — BP 135/69 | HR 69 | Ht 62.5 in | Wt 224.2 lb

## 2017-08-15 DIAGNOSIS — I10 Essential (primary) hypertension: Secondary | ICD-10-CM | POA: Diagnosis not present

## 2017-08-15 DIAGNOSIS — I2510 Atherosclerotic heart disease of native coronary artery without angina pectoris: Secondary | ICD-10-CM | POA: Diagnosis not present

## 2017-08-15 DIAGNOSIS — E782 Mixed hyperlipidemia: Secondary | ICD-10-CM | POA: Diagnosis not present

## 2017-08-15 MED ORDER — LISINOPRIL 5 MG PO TABS: 5.0000 mg | ORAL_TABLET | Freq: Every day | ORAL | 3 refills | Status: DC

## 2017-08-15 NOTE — Patient Instructions (Addendum)
Continue your current therapy except we will switch losartan to lisinopril 5 mg daily  I will see you in 6 months

## 2017-09-01 ENCOUNTER — Telehealth: Payer: Self-pay | Admitting: Cardiology

## 2017-09-01 NOTE — Telephone Encounter (Signed)
Spoke with pt dtr, for the last 4 days the patient has been having a lot of dizziness and has felt bad. Her bp today was 120/60. She reports dizziness with any movement, turning her head and lying down. The patient states she feels like she is drunk. Advised patient it sounds like vertigo because her bp is good. They will try OTC allergy medication to help. If no better, they will contact her medical doctor.

## 2017-09-01 NOTE — Telephone Encounter (Signed)
New Message:       Pt c/o BP issue: STAT if pt c/o blurred vision, one-sided weakness or slurred speech  1. What are your last 5 BP readings? 126/20:today 120/60:yesterday afternoon  2. Are you having any other symptoms (ex. Dizziness, headache, blurred vision, passed out)? Dizziness/swinging head  3. What is your BP issue? Pt is having trouble with bp being low

## 2017-09-15 ENCOUNTER — Telehealth: Payer: Self-pay | Admitting: *Deleted

## 2017-09-15 NOTE — Telephone Encounter (Signed)
Left message for patient to call research office for her 6 month telephone follow up for the OPTIMIZE study.

## 2017-09-18 ENCOUNTER — Encounter (HOSPITAL_COMMUNITY): Payer: Self-pay

## 2017-09-18 ENCOUNTER — Inpatient Hospital Stay (HOSPITAL_COMMUNITY)
Admission: EM | Admit: 2017-09-18 | Discharge: 2017-09-21 | DRG: 083 | Disposition: A | Discharge status: Skilled Nursing Facility | Payer: Medicare Other | Attending: Internal Medicine | Admitting: Internal Medicine

## 2017-09-18 ENCOUNTER — Emergency Department (HOSPITAL_COMMUNITY): Payer: Medicare Other

## 2017-09-18 DIAGNOSIS — Z955 Presence of coronary angioplasty implant and graft: Secondary | ICD-10-CM | POA: Diagnosis not present

## 2017-09-18 DIAGNOSIS — I2510 Atherosclerotic heart disease of native coronary artery without angina pectoris: Secondary | ICD-10-CM

## 2017-09-18 DIAGNOSIS — S065X0A Traumatic subdural hemorrhage without loss of consciousness, initial encounter: Secondary | ICD-10-CM | POA: Diagnosis not present

## 2017-09-18 DIAGNOSIS — D631 Anemia in chronic kidney disease: Secondary | ICD-10-CM | POA: Diagnosis present

## 2017-09-18 DIAGNOSIS — R41841 Cognitive communication deficit: Secondary | ICD-10-CM | POA: Diagnosis not present

## 2017-09-18 DIAGNOSIS — I10 Essential (primary) hypertension: Secondary | ICD-10-CM | POA: Diagnosis not present

## 2017-09-18 DIAGNOSIS — R040 Epistaxis: Secondary | ICD-10-CM | POA: Insufficient documentation

## 2017-09-18 DIAGNOSIS — S0083XA Contusion of other part of head, initial encounter: Secondary | ICD-10-CM | POA: Diagnosis not present

## 2017-09-18 DIAGNOSIS — Z981 Arthrodesis status: Secondary | ICD-10-CM | POA: Diagnosis not present

## 2017-09-18 DIAGNOSIS — W010XXA Fall on same level from slipping, tripping and stumbling without subsequent striking against object, initial encounter: Secondary | ICD-10-CM | POA: Diagnosis present

## 2017-09-18 DIAGNOSIS — I629 Nontraumatic intracranial hemorrhage, unspecified: Secondary | ICD-10-CM | POA: Diagnosis not present

## 2017-09-18 DIAGNOSIS — R278 Other lack of coordination: Secondary | ICD-10-CM | POA: Diagnosis not present

## 2017-09-18 DIAGNOSIS — M6389 Disorders of muscle in diseases classified elsewhere, multiple sites: Secondary | ICD-10-CM | POA: Diagnosis not present

## 2017-09-18 DIAGNOSIS — Z90710 Acquired absence of both cervix and uterus: Secondary | ICD-10-CM

## 2017-09-18 DIAGNOSIS — N183 Chronic kidney disease, stage 3 unspecified: Secondary | ICD-10-CM | POA: Diagnosis present

## 2017-09-18 DIAGNOSIS — S065X9A Traumatic subdural hemorrhage with loss of consciousness of unspecified duration, initial encounter: Principal | ICD-10-CM | POA: Diagnosis present

## 2017-09-18 DIAGNOSIS — S199XXA Unspecified injury of neck, initial encounter: Secondary | ICD-10-CM | POA: Diagnosis not present

## 2017-09-18 DIAGNOSIS — Z888 Allergy status to other drugs, medicaments and biological substances status: Secondary | ICD-10-CM

## 2017-09-18 DIAGNOSIS — W228XXA Striking against or struck by other objects, initial encounter: Secondary | ICD-10-CM | POA: Diagnosis present

## 2017-09-18 DIAGNOSIS — S022XXA Fracture of nasal bones, initial encounter for closed fracture: Secondary | ICD-10-CM | POA: Diagnosis present

## 2017-09-18 DIAGNOSIS — M542 Cervicalgia: Secondary | ICD-10-CM | POA: Diagnosis not present

## 2017-09-18 DIAGNOSIS — I129 Hypertensive chronic kidney disease with stage 1 through stage 4 chronic kidney disease, or unspecified chronic kidney disease: Secondary | ICD-10-CM | POA: Diagnosis present

## 2017-09-18 DIAGNOSIS — S99929A Unspecified injury of unspecified foot, initial encounter: Secondary | ICD-10-CM | POA: Diagnosis present

## 2017-09-18 DIAGNOSIS — M6281 Muscle weakness (generalized): Secondary | ICD-10-CM | POA: Diagnosis not present

## 2017-09-18 DIAGNOSIS — M109 Gout, unspecified: Secondary | ICD-10-CM | POA: Diagnosis present

## 2017-09-18 DIAGNOSIS — Z8679 Personal history of other diseases of the circulatory system: Secondary | ICD-10-CM | POA: Diagnosis present

## 2017-09-18 DIAGNOSIS — Z79899 Other long term (current) drug therapy: Secondary | ICD-10-CM

## 2017-09-18 DIAGNOSIS — Z7902 Long term (current) use of antithrombotics/antiplatelets: Secondary | ICD-10-CM | POA: Diagnosis not present

## 2017-09-18 DIAGNOSIS — Z91048 Other nonmedicinal substance allergy status: Secondary | ICD-10-CM

## 2017-09-18 DIAGNOSIS — Z96653 Presence of artificial knee joint, bilateral: Secondary | ICD-10-CM | POA: Diagnosis present

## 2017-09-18 DIAGNOSIS — S065XAA Traumatic subdural hemorrhage with loss of consciousness status unknown, initial encounter: Secondary | ICD-10-CM

## 2017-09-18 DIAGNOSIS — R609 Edema, unspecified: Secondary | ICD-10-CM | POA: Diagnosis not present

## 2017-09-18 DIAGNOSIS — D62 Acute posthemorrhagic anemia: Secondary | ICD-10-CM | POA: Diagnosis present

## 2017-09-18 DIAGNOSIS — E785 Hyperlipidemia, unspecified: Secondary | ICD-10-CM | POA: Diagnosis present

## 2017-09-18 DIAGNOSIS — E7849 Other hyperlipidemia: Secondary | ICD-10-CM | POA: Diagnosis not present

## 2017-09-18 DIAGNOSIS — G4709 Other insomnia: Secondary | ICD-10-CM | POA: Diagnosis not present

## 2017-09-18 DIAGNOSIS — R2681 Unsteadiness on feet: Secondary | ICD-10-CM | POA: Diagnosis not present

## 2017-09-18 DIAGNOSIS — I6200 Nontraumatic subdural hemorrhage, unspecified: Secondary | ICD-10-CM | POA: Diagnosis not present

## 2017-09-18 DIAGNOSIS — S022XXB Fracture of nasal bones, initial encounter for open fracture: Secondary | ICD-10-CM | POA: Diagnosis not present

## 2017-09-18 DIAGNOSIS — S022XXD Fracture of nasal bones, subsequent encounter for fracture with routine healing: Secondary | ICD-10-CM | POA: Diagnosis not present

## 2017-09-18 DIAGNOSIS — Z7982 Long term (current) use of aspirin: Secondary | ICD-10-CM

## 2017-09-18 DIAGNOSIS — N1832 Chronic kidney disease, stage 3b: Secondary | ICD-10-CM | POA: Diagnosis present

## 2017-09-18 DIAGNOSIS — Z9861 Coronary angioplasty status: Secondary | ICD-10-CM

## 2017-09-18 DIAGNOSIS — W19XXXA Unspecified fall, initial encounter: Secondary | ICD-10-CM | POA: Diagnosis not present

## 2017-09-18 DIAGNOSIS — R58 Hemorrhage, not elsewhere classified: Secondary | ICD-10-CM | POA: Diagnosis not present

## 2017-09-18 DIAGNOSIS — R1311 Dysphagia, oral phase: Secondary | ICD-10-CM | POA: Diagnosis not present

## 2017-09-18 DIAGNOSIS — D6489 Other specified anemias: Secondary | ICD-10-CM | POA: Diagnosis not present

## 2017-09-18 DIAGNOSIS — S065X0D Traumatic subdural hemorrhage without loss of consciousness, subsequent encounter: Secondary | ICD-10-CM | POA: Diagnosis not present

## 2017-09-18 DIAGNOSIS — Z7901 Long term (current) use of anticoagulants: Secondary | ICD-10-CM

## 2017-09-18 HISTORY — DX: Atherosclerotic heart disease of native coronary artery without angina pectoris: I25.10

## 2017-09-18 LAB — CBC WITH DIFFERENTIAL/PLATELET
Abs Immature Granulocytes: 0 10*3/uL (ref 0.0–0.1)
Basophils Absolute: 0.1 10*3/uL (ref 0.0–0.1)
Basophils Relative: 1 %
Eosinophils Absolute: 0 10*3/uL (ref 0.0–0.7)
Eosinophils Relative: 0 %
HCT: 29.3 % — ABNORMAL LOW (ref 36.0–46.0)
Hemoglobin: 8.9 g/dL — ABNORMAL LOW (ref 12.0–15.0)
Immature Granulocytes: 0 %
Lymphocytes Relative: 15 %
Lymphs Abs: 1.7 10*3/uL (ref 0.7–4.0)
MCH: 27.6 pg (ref 26.0–34.0)
MCHC: 30.4 g/dL (ref 30.0–36.0)
MCV: 91 fL (ref 78.0–100.0)
Monocytes Absolute: 1.3 10*3/uL — ABNORMAL HIGH (ref 0.1–1.0)
Monocytes Relative: 12 %
Neutro Abs: 7.8 10*3/uL — ABNORMAL HIGH (ref 1.7–7.7)
Neutrophils Relative %: 72 %
Platelets: 200 10*3/uL (ref 150–400)
RBC: 3.22 MIL/uL — ABNORMAL LOW (ref 3.87–5.11)
RDW: 17.5 % — ABNORMAL HIGH (ref 11.5–15.5)
WBC: 10.9 10*3/uL — ABNORMAL HIGH (ref 4.0–10.5)

## 2017-09-18 LAB — BASIC METABOLIC PANEL
Anion gap: 8 (ref 5–15)
BUN: 36 mg/dL — ABNORMAL HIGH (ref 6–20)
CO2: 22 mmol/L (ref 22–32)
Calcium: 8.7 mg/dL — ABNORMAL LOW (ref 8.9–10.3)
Chloride: 108 mmol/L (ref 101–111)
Creatinine, Ser: 1.44 mg/dL — ABNORMAL HIGH (ref 0.44–1.00)
GFR calc Af Amer: 38 mL/min — ABNORMAL LOW (ref >60)
GFR calc non Af Amer: 33 mL/min — ABNORMAL LOW (ref >60)
Glucose, Bld: 117 mg/dL — ABNORMAL HIGH (ref 65–99)
Potassium: 4.2 mmol/L (ref 3.5–5.1)
Sodium: 138 mmol/L (ref 135–145)

## 2017-09-18 LAB — PROTIME-INR
INR: 1.06
Prothrombin Time: 13.7 seconds (ref 11.4–15.2)

## 2017-09-18 MED ORDER — OXYCODONE-ACETAMINOPHEN 5-325 MG PO TABS
1.0000 | ORAL_TABLET | Freq: Once | ORAL | Status: AC
  Administered 2017-09-18: 1 via ORAL
  Filled 2017-09-18: qty 1

## 2017-09-18 MED ORDER — ROSUVASTATIN CALCIUM 5 MG PO TABS
10.0000 mg | ORAL_TABLET | Freq: Every day | ORAL | Status: DC
  Administered 2017-09-18 – 2017-09-20 (×3): 10 mg via ORAL
  Filled 2017-09-18 (×3): qty 2

## 2017-09-18 MED ORDER — OXYCODONE-ACETAMINOPHEN 5-325 MG PO TABS
1.0000 | ORAL_TABLET | ORAL | Status: DC | PRN
  Administered 2017-09-18 – 2017-09-21 (×6): 1 via ORAL
  Filled 2017-09-18 (×6): qty 1

## 2017-09-18 MED ORDER — ACETAMINOPHEN 325 MG PO TABS: 650.0000 mg | ORAL_TABLET | Freq: Four times a day (QID) | ORAL | Status: DC | PRN

## 2017-09-18 MED ORDER — SODIUM CHLORIDE 0.9% FLUSH: 3.0000 mL | INTRAVENOUS | Status: DC | PRN

## 2017-09-18 MED ORDER — COLCHICINE 0.6 MG PO TABS
0.6000 mg | ORAL_TABLET | Freq: Two times a day (BID) | ORAL | Status: DC
  Administered 2017-09-18 – 2017-09-21 (×6): 0.6 mg via ORAL
  Filled 2017-09-18 (×6): qty 1

## 2017-09-18 MED ORDER — SODIUM CHLORIDE 0.9 % IV SOLN
Freq: Once | INTRAVENOUS | Status: AC
  Administered 2017-09-18: 17:00:00 via INTRAVENOUS

## 2017-09-18 MED ORDER — MORPHINE SULFATE (PF) 4 MG/ML IV SOLN: 0.5000 mg | INTRAVENOUS | Status: DC | PRN

## 2017-09-18 MED ORDER — SODIUM CHLORIDE 0.9% FLUSH
3.0000 mL | Freq: Two times a day (BID) | INTRAVENOUS | Status: DC
  Administered 2017-09-18 – 2017-09-21 (×6): 3 mL via INTRAVENOUS

## 2017-09-18 MED ORDER — ONDANSETRON HCL 4 MG/2ML IJ SOLN: 4.0000 mg | Freq: Four times a day (QID) | INTRAMUSCULAR | Status: DC | PRN

## 2017-09-18 MED ORDER — SALINE SPRAY 0.65 % NA SOLN
2.0000 | NASAL | Status: DC | PRN
  Administered 2017-09-19: 2 via NASAL
  Filled 2017-09-18: qty 44

## 2017-09-18 MED ORDER — MORPHINE SULFATE (PF) 4 MG/ML IV SOLN
4.0000 mg | Freq: Once | INTRAVENOUS | Status: DC
  Filled 2017-09-18: qty 1

## 2017-09-18 MED ORDER — OXYMETAZOLINE HCL 0.05 % NA SOLN
1.0000 | Freq: Once | NASAL | Status: AC
  Administered 2017-09-18: 1 via NASAL
  Filled 2017-09-18: qty 15

## 2017-09-18 MED ORDER — ACETAMINOPHEN 650 MG RE SUPP: 650.0000 mg | Freq: Four times a day (QID) | RECTAL | Status: DC | PRN

## 2017-09-18 MED ORDER — AMLODIPINE BESYLATE 5 MG PO TABS
5.0000 mg | ORAL_TABLET | Freq: Every day | ORAL | Status: DC
  Administered 2017-09-18 – 2017-09-21 (×4): 5 mg via ORAL
  Filled 2017-09-18 (×4): qty 1

## 2017-09-18 MED ORDER — MORPHINE SULFATE (PF) 4 MG/ML IV SOLN: 4.0000 mg | Freq: Once | INTRAVENOUS | Status: DC

## 2017-09-18 MED ORDER — SODIUM CHLORIDE 0.9 % IV SOLN: Freq: Once | INTRAVENOUS | Status: DC

## 2017-09-18 MED ORDER — CARVEDILOL 12.5 MG PO TABS
25.0000 mg | ORAL_TABLET | Freq: Two times a day (BID) | ORAL | Status: DC
  Administered 2017-09-18 – 2017-09-21 (×6): 25 mg via ORAL
  Filled 2017-09-18 (×7): qty 2

## 2017-09-18 MED ORDER — ONDANSETRON HCL 4 MG/2ML IJ SOLN
4.0000 mg | Freq: Once | INTRAMUSCULAR | Status: AC
  Administered 2017-09-18: 4 mg via INTRAVENOUS

## 2017-09-18 MED ORDER — LOSARTAN POTASSIUM 50 MG PO TABS
25.0000 mg | ORAL_TABLET | Freq: Every day | ORAL | Status: DC
  Administered 2017-09-18 – 2017-09-21 (×4): 25 mg via ORAL
  Filled 2017-09-18 (×4): qty 1

## 2017-09-18 MED ORDER — SILVER NITRATE-POT NITRATE 75-25 % EX MISC
1.0000 | Freq: Once | CUTANEOUS | Status: AC
  Administered 2017-09-18: 1 via TOPICAL
  Filled 2017-09-18: qty 1

## 2017-09-18 MED ORDER — CEPHALEXIN 250 MG PO CAPS
250.0000 mg | ORAL_CAPSULE | Freq: Four times a day (QID) | ORAL | Status: DC
  Administered 2017-09-18 – 2017-09-21 (×11): 250 mg via ORAL
  Filled 2017-09-18 (×11): qty 1

## 2017-09-18 MED ORDER — POLYETHYLENE GLYCOL 3350 17 G PO PACK
17.0000 g | PACK | Freq: Every day | ORAL | Status: DC | PRN
  Administered 2017-09-20: 17 g via ORAL
  Filled 2017-09-18: qty 1

## 2017-09-18 MED ORDER — SODIUM CHLORIDE 0.9 % IV SOLN: 250.0000 mL | INTRAVENOUS | Status: DC | PRN

## 2017-09-18 MED ORDER — LOPERAMIDE HCL 2 MG PO CAPS: 2.0000 mg | ORAL_CAPSULE | ORAL | Status: DC | PRN

## 2017-09-18 MED ORDER — ONDANSETRON 4 MG PO TBDP
4.0000 mg | ORAL_TABLET | Freq: Once | ORAL | Status: DC
  Administered 2017-09-18: 4 mg via ORAL
  Filled 2017-09-18: qty 1

## 2017-09-18 MED ORDER — DULOXETINE HCL 20 MG PO CPEP
20.0000 mg | ORAL_CAPSULE | Freq: Every day | ORAL | Status: DC
  Administered 2017-09-19 – 2017-09-21 (×3): 20 mg via ORAL
  Filled 2017-09-18 (×4): qty 1

## 2017-09-18 NOTE — Consult Note (Signed)
Valerie Ramirez, [place] 84 y.o., female [id number]     Chief Complaint: nose bleed  HPI: 84 yo wf, stumbled and fell ~8 hrs ago.  No LOC.  On Plavix and ASA.  Hx recent dizziness, possibly medication related.   Pain in face and neck.   CT head/maxillofacial shows sphenoid fluid, possible non-displaced LEFT nasal dorsal fx.  Interhemispheric SDH.  Seen by Dr. Sherwood Gambler.  ENT called by ED for RIGHT epistaxis, persistent.  Balloon pack RIGHT with improvement.    PMH: Past Medical History:  Diagnosis Date  . Arthritis    OA  . CAD (coronary artery disease)   . Cancer ([place])    endometrial  . GERD (gastroesophageal reflux disease)   . Hearing loss   . History of kidney stones   . Hyperlipidemia   . Hypertension   . Hypothyroid    "not in years"  . Shortness of breath dyspnea    04/25/17- not anymore    Surg Hx: Past Surgical History:  Procedure Laterality Date  . ABDOMINAL HYSTERECTOMY    . ANTERIOR CERVICAL DECOMP/DISCECTOMY FUSION [name] 08/18/2015   Procedure: C5-6, C6-7 Anterior Cervical Discectomy and Fusion, Allograft, Plate;  Surgeon: Marybelle Killings, MD;  Location: [facility];  Service: Orthopedics;  Laterality: [name];  . bone spur removed Right    shoulder  . BOWEL RESECTION  07/04/2012   Procedure: SMALL BOWEL RESECTION;  Surgeon: Alvino Chapel, MD;  Location: [facility];  Service: Gynecology;;  . CHOLECYSTECTOMY    . CORONARY STENT INTERVENTION [name] 04/06/2017   Procedure: CORONARY STENT INTERVENTION;  Surgeon: Wellington Hampshire, MD;  Location: [facility];  Service: Cardiovascular;  Laterality: [name];  . DILATION AND CURETTAGE OF UTERUS  1999  . EYE SURGERY Bilateral    cataracts  . 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 HAND SURGERY  12/2008   after hand fx/due to fall  . JOINT REPLACEMENT Bilateral 07/1998   knees  . KNEE  ARTHROPLASTY  05/23/1998   total right  . LAPAROTOMY Bilateral 07/04/2012   Procedure: EXPLORATORY LAPAROTOMY TOTAL ABDOMINAL HYSTERECTOMY BILATERAL SALPINGO-OOPHORECTOMY with small bowel resection ;  Surgeon: Alvino Chapel, MD;  Location: [facility];  Service: Gynecology;  Laterality: Bilateral;  . LEFT HEART CATH AND CORONARY ANGIOGRAPHY [name] 04/06/2017   Procedure: LEFT HEART CATH AND CORONARY ANGIOGRAPHY;  Surgeon: Jolaine Artist, MD;  Location: [facility];  Service: Cardiovascular;  Laterality: [name];  . polyp removal  1999  . TUBAL LIGATION      FHx:   Family History  Problem Relation Age of Onset  . Cancer Brother        LEUKEMIA  . Cancer Brother        LUNG  . Cancer Brother        STOMACH  . Heart disease Sister        CABG  . Stroke Sister   . Heart disease Brother        CABG   SocHx:  reports that she has never smoked. She has never used smokeless tobacco. She reports that she does not drink alcohol or use drugs.  ALLERGIES:  Allergies  Allergen Reactions  . Allopurinol Nausea Only  . Lipitor [Atorvastatin Calcium] Other (See Comments)    Leg ache and ache all over  . Simvastatin Other (See  Comments)    REACTION: muscle pain  . Tape Itching and Rash    "Cannot use paper tape" --- also causes blisters. Use plastic tape    Medications Prior to Admission  Medication Sig Dispense Refill  . acetaminophen (TYLENOL) 650 MG CR tablet Take 650 mg by mouth every 8 (eight) hours as needed for pain.    [name] amLODipine (NORVASC) 10 MG tablet Take 1 tablet (10 mg total) by mouth daily. 30 tablet 11  . aspirin 81 MG chewable tablet Chew 1 tablet (81 mg total) by mouth daily.    . carvedilol (COREG) 25 MG tablet Take 1 tablet (25 mg total) by mouth 2 (two) times daily with a meal. 60 tablet 11  . clopidogrel (PLAVIX) 75 MG tablet Take 1 tablet (75 mg total) by mouth daily with breakfast. 30 tablet 11  . colchicine 0.6 MG tablet Take 1 tablet (0.6 mg total) by  mouth 2 (two) times daily. 60 tablet 11  . doxazosin (CARDURA) 2 MG tablet TAKE 1 TABLET (8 MG TOTAL) BY MOUTH AT BEDTIME. (Patient taking differently: 2 mg. TAKE 1 TABLET (8 MG TOTAL) BY MOUTH AT BEDTIME.) 30 tablet 5  . DULoxetine (CYMBALTA) 20 MG capsule TAKE 1 CAPSULE (20 MG TOTAL) BY MOUTH DAILY. 90 capsule 3  . lisinopril (PRINIVIL,ZESTRIL) 5 MG tablet Take 1 tablet (5 mg total) by mouth daily. 90 tablet 3  . loperamide (IMODIUM) 2 MG capsule Take 1 capsule (2 mg total) by mouth as needed for diarrhea or loose stools. 20 capsule 0  . losartan (COZAAR) 25 MG tablet Take 25 mg by mouth daily.    . nitroGLYCERIN (NITROSTAT) 0.4 MG SL tablet Place 1 tablet (0.4 mg total) under the tongue every 5 (five) minutes x 3 doses as needed for chest pain. 25 tablet 12  . omega-3 acid ethyl esters (LOVAZA) 1 g capsule Take 1 capsule (1 g total) by mouth 2 (two) times daily. 60 capsule 4  . rosuvastatin (CRESTOR) 10 MG tablet Take 1 tablet (10 mg total) by mouth daily at 6 PM. 30 tablet 11  . vitamin B-12 (CYANOCOBALAMIN) 1000 MCG tablet Take 1,000 mcg by mouth daily.    [name] HYDROcodone-acetaminophen (NORCO/VICODIN) 5-325 MG tablet Take 1-2 tablets by mouth every 6 (six) hours as needed. (Patient not taking: Reported on 09/18/2017) 5 tablet 0  . traMADol (ULTRAM) 50 MG tablet Take 1 tablet (50 mg total) by mouth every 6 (six) hours as needed. (Patient not taking: Reported on 09/18/2017) 12 tablet 0    Results for orders placed or performed during the hospital encounter of 09/18/17 (from the past 48 hour(s))  Basic metabolic panel     Status: Abnormal   Collection Time: 09/18/17 11:35 AM  Result Value Ref Range   Sodium 138 135 - 145 mmol/L   Potassium 4.2 3.5 - 5.1 mmol/L   Chloride 108 101 - 111 mmol/L   CO2 22 22 - 32 mmol/L   Glucose, Bld 117 (H) 65 - 99 mg/dL   BUN 36 (H) 6 - 20 mg/dL   Creatinine, Ser 1.44 (H) 0.44 - 1.00 mg/dL   Calcium 8.7 (L) 8.9 - 10.3 mg/dL   GFR calc non Af Amer 33 (L) >60  mL/min   GFR calc Af Amer 38 (L) >60 mL/min    Comment: (NOTE) The eGFR has been calculated using the CKD EPI equation. This calculation has not been validated in all clinical situations. eGFR's persistently <60 mL/min signify possible Chronic Kidney Disease.  Anion gap 8 5 - 15    Comment: Performed at [facility] [address], [place], [place] [id number]  CBC with Differential     Status: Abnormal   Collection Time: 09/18/17 11:35 AM  Result Value Ref Range   WBC 10.9 (H) 4.0 - 10.5 K/uL   RBC 3.22 (L) 3.87 - 5.11 MIL/uL   Hemoglobin 8.9 (L) 12.0 - 15.0 g/dL   HCT 29.3 (L) 36.0 - 46.0 %   MCV 91.0 78.0 - 100.0 fL   MCH 27.6 26.0 - 34.0 pg   MCHC 30.4 30.0 - 36.0 g/dL   RDW 17.5 (H) 11.5 - 15.5 %   Platelets 200 150 - 400 K/uL   Neutrophils Relative % 72 %   Neutro Abs 7.8 (H) 1.7 - 7.7 K/uL   Lymphocytes Relative 15 %   Lymphs Abs 1.7 0.7 - 4.0 K/uL   Monocytes Relative 12 %   Monocytes Absolute 1.3 (H) 0.1 - 1.0 K/uL   Eosinophils Relative 0 %   Eosinophils Absolute 0.0 0.0 - 0.7 K/uL   Basophils Relative 1 %   Basophils Absolute 0.1 0.0 - 0.1 K/uL   Immature Granulocytes 0 %   Abs Immature Granulocytes 0.0 0.0 - 0.1 K/uL    Comment: Performed at [facility], [place] [address] [place], [place] [address]  Protime-INR     Status: None   Collection Time: 09/18/17 11:35 AM  Result Value Ref Range   Prothrombin Time 13.7 11.4 - 15.2 seconds   INR 1.06     Comment: Performed at [facility], [place] [address] [place], [place] [address]   Ct Head Wo Contrast  Result Date: 09/18/2017 CLINICAL DATA:  Fall this morning, hematoma to forehead, bleeding from nose. EXAM: CT HEAD WITHOUT CONTRAST CT MAXILLOFACIAL WITHOUT CONTRAST CT CERVICAL SPINE WITHOUT CONTRAST TECHNIQUE: Multidetector CT imaging of the head, cervical spine, and maxillofacial structures were performed using the standard protocol without intravenous contrast. Multiplanar CT image reconstructions  of the cervical spine and maxillofacial structures were also generated. COMPARISON:  Brain MRI dated 05/02/2017. FINDINGS: CT HEAD FINDINGS Brain: Small acute subdural hemorrhage located just to the LEFT of the interhemispheric fissure, at the upper margin of the anterior falx, measuring 7 mm thickness, without significant mass effect. No additional extra-axial hemorrhage. No parenchymal hemorrhage or evidence of parenchymal edema. Chronic small vessel ischemic changes are seen throughout the bilateral periventricular and subcortical white matter regions. 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 No hydrocephalus. Vascular: There are chronic calcified atherosclerotic changes of the large vessels at the skull base. No unexpected hyperdense vessel. Skull: No skull fracture or displacement. Other: Large soft tissue hematoma overlying the lower LEFT frontal bone. No underlying fracture. Slightly displaced fracture at the anterior aspects of the LEFT nasal bone. CT MAXILLOFACIAL FINDINGS Osseous: Lower frontal bones are intact and normally aligned. Osseous structures about the orbits are intact and normally aligned bilaterally. Bilateral zygomatic arches and pterygoid plates are intact. No mandible fracture or displacement seen. A focal lucency within the anterior RIGHT mandible appears to have well corticated margins indicating vessel foramen, not appreciably changed compared to the earlier facial bone CT of 12/23/2008. Incidental note made of chronic degenerative change at the RIGHT TMJ with associated mild subluxation of the mandibular condyle. Slightly displaced fracture at the anterior aspects of the LEFT nasal bone. Questionable minimally displaced fracture at the upper margin of the nasal septum. Chronic  rightward deviation of the nasal septum. Orbits: Orbital globes appear intact and symmetric in configuration. No retro-orbital hemorrhage or edema. Sinuses: Fluid  levels within the bilateral maxillary sinuses, sphenoid sinus and ethmoid air cells Soft tissues: Prominent soft tissue edema/hematoma overlying the LEFT lower frontal bone, medial LEFT orbit and nasal bones. CT CERVICAL SPINE FINDINGS Alignment: No evidence of acute vertebral body subluxation. Skull base and vertebrae: No fracture line or displaced fracture fragment. Facet joints appear intact and normally aligned throughout. Anterior cervical fusion hardware appears intact and appropriately positioned at the C5 through C7 levels. Soft tissues and spinal canal: No prevertebral fluid or swelling. No visible canal hematoma. Disc levels:  No significant central canal stenosis at any level. Upper chest: No acute findings. Other: Bilateral carotid atherosclerosis. IMPRESSION: 1. Small acute subdural hemorrhage along the anterior aspects of the interhemispheric fissure, measuring 7 mm greatest thickness, without significant mass effect. No other intracranial hemorrhage. No skull fracture. 2. 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 Additional chronic small vessel ischemic changes throughout the white matter regions bilaterally. 3. Large soft tissue hematoma overlying the LEFT lower frontal bone. No underlying fracture. 4. Slightly displaced fracture of the LEFT nasal bone, anterior aspect. Questionable minimally displaced fracture of the underlying anterior nasal septum. Associated fluid levels within the paranasal sinuses. 5. No other facial bone fracture or displacement seen. 6. No fracture or acute subluxation within the cervical spine. Anterior cervical fusion hardware within the lower cervical spine appears intact and appropriately positioned. These results were called by telephone at the time of interpretation on 09/18/2017 at 11:07 am to Dr. Isla Pence , who verbally acknowledged these results. Electronically Signed   By: Franki Cabot M.D.   On: 09/18/2017 11:09   Ct  Cervical Spine Wo Contrast  Result Date: 09/18/2017 CLINICAL DATA:  Fall this morning, hematoma to forehead, bleeding from nose. EXAM: CT HEAD WITHOUT CONTRAST CT MAXILLOFACIAL WITHOUT CONTRAST CT CERVICAL SPINE WITHOUT CONTRAST TECHNIQUE: Multidetector CT imaging of the head, cervical spine, and maxillofacial structures were performed using the standard protocol without intravenous contrast. Multiplanar CT image reconstructions of the cervical spine and maxillofacial structures were also generated. COMPARISON:  Brain MRI dated 05/02/2017. FINDINGS: CT HEAD FINDINGS Brain: Small acute subdural hemorrhage located just to the LEFT of the interhemispheric fissure, at the upper margin of the anterior falx, measuring 7 mm thickness, without significant mass effect. No additional extra-axial hemorrhage. No parenchymal hemorrhage or evidence of parenchymal edema. Chronic small vessel ischemic changes are seen throughout the bilateral periventricular and subcortical white matter regions. 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 No hydrocephalus. Vascular: There are chronic calcified atherosclerotic changes of the large vessels at the skull base. No unexpected hyperdense vessel. Skull: No skull fracture or displacement. Other: Large soft tissue hematoma overlying the lower LEFT frontal bone. No underlying fracture. Slightly displaced fracture at the anterior aspects of the LEFT nasal bone. CT MAXILLOFACIAL FINDINGS Osseous: Lower frontal bones are intact and normally aligned. Osseous structures about the orbits are intact and normally aligned bilaterally. Bilateral zygomatic arches and pterygoid plates are intact. No mandible fracture or displacement seen. A focal lucency within the anterior RIGHT mandible appears to have well corticated margins indicating vessel foramen, not appreciably changed compared to the earlier facial bone CT of 12/23/2008. Incidental note made of  chronic degenerative change at the RIGHT TMJ with associated mild subluxation of the mandibular condyle. Slightly displaced fracture at the anterior  aspects of the LEFT nasal bone. Questionable minimally displaced fracture at the upper margin of the nasal septum. Chronic rightward deviation of the nasal septum. Orbits: Orbital globes appear intact and symmetric in configuration. No retro-orbital hemorrhage or edema. Sinuses: Fluid levels within the bilateral maxillary sinuses, sphenoid sinus and ethmoid air cells Soft tissues: Prominent soft tissue edema/hematoma overlying the LEFT lower frontal bone, medial LEFT orbit and nasal bones. CT CERVICAL SPINE FINDINGS Alignment: No evidence of acute vertebral body subluxation. Skull base and vertebrae: No fracture line or displaced fracture fragment. Facet joints appear intact and normally aligned throughout. Anterior cervical fusion hardware appears intact and appropriately positioned at the C5 through C7 levels. Soft tissues and spinal canal: No prevertebral fluid or swelling. No visible canal hematoma. Disc levels:  No significant central canal stenosis at any level. Upper chest: No acute findings. Other: Bilateral carotid atherosclerosis. IMPRESSION: 1. Small acute subdural hemorrhage along the anterior aspects of the interhemispheric fissure, measuring 7 mm greatest thickness, without significant mass effect. No other intracranial hemorrhage. No skull fracture. 2. 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 Additional chronic small vessel ischemic changes throughout the white matter regions bilaterally. 3. Large soft tissue hematoma overlying the LEFT lower frontal bone. No underlying fracture. 4. Slightly displaced fracture of the LEFT nasal bone, anterior aspect. Questionable minimally displaced fracture of the underlying anterior nasal septum. Associated fluid levels within the paranasal sinuses. 5. No other facial bone fracture  or displacement seen. 6. No fracture or acute subluxation within the cervical spine. Anterior cervical fusion hardware within the lower cervical spine appears intact and appropriately positioned. These results were called by telephone at the time of interpretation on 09/18/2017 at 11:07 am to Dr. Isla Pence , who verbally acknowledged these results. Electronically Signed   By: Franki Cabot M.D.   On: 09/18/2017 11:09   Ct Maxillofacial Wo Contrast  Result Date: 09/18/2017 CLINICAL DATA:  Fall this morning, hematoma to forehead, bleeding from nose. EXAM: CT HEAD WITHOUT CONTRAST CT MAXILLOFACIAL WITHOUT CONTRAST CT CERVICAL SPINE WITHOUT CONTRAST TECHNIQUE: Multidetector CT imaging of the head, cervical spine, and maxillofacial structures were performed using the standard protocol without intravenous contrast. Multiplanar CT image reconstructions of the cervical spine and maxillofacial structures were also generated. COMPARISON:  Brain MRI dated 05/02/2017. FINDINGS: CT HEAD FINDINGS Brain: Small acute subdural hemorrhage located just to the LEFT of the interhemispheric fissure, at the upper margin of the anterior falx, measuring 7 mm thickness, without significant mass effect. No additional extra-axial hemorrhage. No parenchymal hemorrhage or evidence of parenchymal edema. Chronic small vessel ischemic changes are seen throughout the bilateral periventricular and subcortical white matter regions. 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 No hydrocephalus. Vascular: There are chronic calcified atherosclerotic changes of the large vessels at the skull base. No unexpected hyperdense vessel. Skull: No skull fracture or displacement. Other: Large soft tissue hematoma overlying the lower LEFT frontal bone. No underlying fracture. Slightly displaced fracture at the anterior aspects of the LEFT nasal bone. CT MAXILLOFACIAL FINDINGS Osseous: Lower frontal bones are intact  and normally aligned. Osseous structures about the orbits are intact and normally aligned bilaterally. Bilateral zygomatic arches and pterygoid plates are intact. No mandible fracture or displacement seen. A focal lucency within the anterior RIGHT mandible appears to have well corticated margins indicating vessel foramen, not appreciably changed compared to the earlier facial bone CT of 12/23/2008. Incidental note made of chronic degenerative change at  the RIGHT TMJ with associated mild subluxation of the mandibular condyle. Slightly displaced fracture at the anterior aspects of the LEFT nasal bone. Questionable minimally displaced fracture at the upper margin of the nasal septum. Chronic rightward deviation of the nasal septum. Orbits: Orbital globes appear intact and symmetric in configuration. No retro-orbital hemorrhage or edema. Sinuses: Fluid levels within the bilateral maxillary sinuses, sphenoid sinus and ethmoid air cells Soft tissues: Prominent soft tissue edema/hematoma overlying the LEFT lower frontal bone, medial LEFT orbit and nasal bones. CT CERVICAL SPINE FINDINGS Alignment: No evidence of acute vertebral body subluxation. Skull base and vertebrae: No fracture line or displaced fracture fragment. Facet joints appear intact and normally aligned throughout. Anterior cervical fusion hardware appears intact and appropriately positioned at the C5 through C7 levels. Soft tissues and spinal canal: No prevertebral fluid or swelling. No visible canal hematoma. Disc levels:  No significant central canal stenosis at any level. Upper chest: No acute findings. Other: Bilateral carotid atherosclerosis. IMPRESSION: 1. Small acute subdural hemorrhage along the anterior aspects of the interhemispheric fissure, measuring 7 mm greatest thickness, without significant mass effect. No other intracranial hemorrhage. No skull fracture. 2. 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  Additional chronic small vessel ischemic changes throughout the white matter regions bilaterally. 3. Large soft tissue hematoma overlying the LEFT lower frontal bone. No underlying fracture. 4. Slightly displaced fracture of the LEFT nasal bone, anterior aspect. Questionable minimally displaced fracture of the underlying anterior nasal septum. Associated fluid levels within the paranasal sinuses. 5. No other facial bone fracture or displacement seen. 6. No fracture or acute subluxation within the cervical spine. Anterior cervical fusion hardware within the lower cervical spine appears intact and appropriately positioned. These results were called by telephone at the time of interpretation on 09/18/2017 at 11:07 am to Dr. Isla Pence , who verbally acknowledged these results. Electronically Signed   By: Franki Cabot M.D.   On: 09/18/2017 11:09     Blood pressure (!) 110/45, pulse 72, temperature 98.6 F (37 C), temperature source Oral, resp. rate 18, SpO2 99 %.  PHYSICAL EXAM: Overall appearance:  ecchymotic and swollen face, esp LEFT forehead.no external lacs.  Nose not palpably distorted.   Head: as above Eyes:  Subjectively intact vision and EOMI without diplopia or dysconjugate gaze. Ears: no Battle's sign Nose:  Balloon pack RIGHT nose.  Clot filling LEFT nose.  No notable bleeding. Oral Cavity:  Ecchymosis and swelling.  No teeth.   Oral Pharynx/Hypopharynx/Larynx:  Sl old blood in pharynx. Neuro: mental status good. Neck:  Without masses.  Studies Reviewed:  CT maxillofacial    Assessment/Plan Fall with minor non-displaced nasal fx.  SDH.  RIGHT epistaxis due to trauma in face of aspirin and Plavix therapy.    Plan:  Ice, elevation, nasal hygiene measures.  Off anticoagulants until cleared per Dr. Sherwood Gambler.  On Keflex (ordered).  I will remove the nasal packs on Wed or Thurs, in my office if possible.    At some point she might benefit from a Vestibular Rehabilitation evaluation to  help her be safe in her own home.    Jodi Marble 10/19/4285, 3:51 PM

## 2017-09-18 NOTE — ED Triage Notes (Signed)
Nose bleed decreasing.

## 2017-09-18 NOTE — ED Triage Notes (Signed)
EDP aty bed side to eval PT.

## 2017-09-18 NOTE — Consult Note (Signed)
Reason for Consult: Head and facial trauma with small left interhemispheric acute subdural hematoma, but on aspirin and Plavix Referring Physician: Dr. Isla Pence  Valerie Ramirez is an 84 y.o. female.  HPI: Patient tripped and fell forward at her home earlier today.  Uncertain of brief loss of consciousness.  Brought by EMS to [facility] emergency room for evaluation.  Seen by Dr. Gilford Raid (EDP) found to have evidence of facial trauma and bleeding from the nares (particularly the right).  CT of the head showed evidence of a small left falx acute subdural hematoma without mass-effect as well as nasal bone fracture.  Patient has some facial discomfort as well as discomfort in the back of the head, but is otherwise without complaints.  Patient's history is notable for having had a single-vessel coronary angioplasty with drug-eluting stent placement 5-1/2 months ago (April 06, 2017) by Dr. Kathlyn Sacramento.  She is followed Dr. Peter Martinique from [facility] health cardiology.  She is continued on aspirin and Plavix since the stenting procedure.  Past Medical History:  Past Medical History:  Diagnosis Date  . Arthritis    OA  . Cancer ([place])    endometrial  . GERD (gastroesophageal reflux disease)   . Hearing loss   . History of kidney stones   . Hyperlipidemia   . Hypertension   . Hypothyroid    "not in years"  . Shortness of breath dyspnea    04/25/17- not anymore    Past Surgical History:  Past Surgical History:  Procedure Laterality Date  . ABDOMINAL HYSTERECTOMY    . ANTERIOR CERVICAL DECOMP/DISCECTOMY FUSION [name] 08/18/2015   Procedure: C5-6, C6-7 Anterior Cervical Discectomy and Fusion, Allograft, Plate;  Surgeon: Marybelle Killings, MD;  Location: [place];  Service: Orthopedics;  Laterality: [name];  . bone spur removed Right    shoulder  . BOWEL RESECTION  07/04/2012   Procedure: SMALL BOWEL RESECTION;  Surgeon: Alvino Chapel, MD;  Location: [facility];  Service:  Gynecology;;  . CHOLECYSTECTOMY    . CORONARY STENT INTERVENTION [name] 04/06/2017   Procedure: CORONARY STENT INTERVENTION;  Surgeon: Wellington Hampshire, MD;  Location: [place] [facility];  Service: Cardiovascular;  Laterality: [name];  . DILATION AND CURETTAGE OF UTERUS  1999  . EYE SURGERY Bilateral    cataracts  . 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 HAND SURGERY  12/2008   after hand fx/due to fall  . JOINT REPLACEMENT Bilateral 07/1998   knees  . KNEE ARTHROPLASTY  05/23/1998   total right  . LAPAROTOMY Bilateral 07/04/2012   Procedure: EXPLORATORY LAPAROTOMY TOTAL ABDOMINAL HYSTERECTOMY BILATERAL SALPINGO-OOPHORECTOMY with small bowel resection ;  Surgeon: Alvino Chapel, MD;  Location: [facility];  Service: Gynecology;  Laterality: Bilateral;  . LEFT HEART CATH AND CORONARY ANGIOGRAPHY [name] 04/06/2017   Procedure: LEFT HEART CATH AND CORONARY ANGIOGRAPHY;  Surgeon: Jolaine Artist, MD;  Location: [place] [facility];  Service: Cardiovascular;  Laterality: [name];  . polyp removal  1999  . TUBAL LIGATION      Family History:  Family History  Problem Relation Age of Onset  . Cancer Brother        LEUKEMIA  . Cancer Brother        LUNG  . Cancer Brother        STOMACH  .  Heart disease Sister        CABG  . Stroke Sister   . Heart disease Brother        CABG    Social History:  reports that she has never smoked. She has never used smokeless tobacco. She reports that she does not drink alcohol or use drugs.  Allergies:  Allergies  Allergen Reactions  . Allopurinol Nausea Only  . Lipitor [Atorvastatin Calcium] Other (See Comments)    Leg ache and ache all over  . Simvastatin Other (See Comments)    REACTION: muscle pain  . Tape Itching and Rash    "Cannot use paper tape" --- also causes blisters. Use plastic tape     Medications: I have reviewed the patient's current medications.  ROS: Notable for those difficulties described in her history of present illness and past medical history, but is otherwise unremarkable.  Physical Examination: Well-developed well-nourished white female in no acute distress. Blood pressure (!) 133/48, pulse 71, temperature 98 F (36.7 C), temperature source Oral, resp. rate 18, SpO2 100 %. External: Continued bleeding from right nares. Extremity: No clubbing cyanosis or edema  Neurological Examination: Mental Status Examination: Awake alert, oriented to name, hospital, and June 2019.  Following commands.  Speech fluent. Cranial Nerve Examination: Pupils equal, round, 3 mm in diameter, reactive to light.  EOMI.  Facial sensation intact.  Facial movement symmetrical.  Hearing present.  Palatal movement symmetrical.  Shoulder shrug symmetrical.  Tongue midline. Motor Examination: 5/5 strength in upper and lower extremities.  No drift of upper extremities. Sensory Examination: Intact in the upper and lower extremities. Reflex Examination:   Symmetrical Gait and Stance Examination: Not tested due to the nature of the patient's condition.   Ct Head Wo Contrast  Result Date: 09/18/2017 CLINICAL DATA:  Fall this morning, hematoma to forehead, bleeding from nose. EXAM: CT HEAD WITHOUT CONTRAST CT MAXILLOFACIAL WITHOUT CONTRAST CT CERVICAL SPINE WITHOUT CONTRAST TECHNIQUE: Multidetector CT imaging of the head, cervical spine, and maxillofacial structures were performed using the standard protocol without intravenous contrast. Multiplanar CT image reconstructions of the cervical spine and maxillofacial structures were also generated. COMPARISON:  Brain MRI dated 05/02/2017. FINDINGS: CT HEAD FINDINGS Brain: Small acute subdural hemorrhage located just to the LEFT of the interhemispheric fissure, at the upper margin of the anterior falx, measuring 7 mm thickness, without significant mass  effect. No additional extra-axial hemorrhage. No parenchymal hemorrhage or evidence of parenchymal edema. Chronic small vessel ischemic changes are seen throughout the bilateral periventricular and subcortical white matter regions. 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 No hydrocephalus. Vascular: There are chronic calcified atherosclerotic changes of the large vessels at the skull base. No unexpected hyperdense vessel. Skull: No skull fracture or displacement. Other: Large soft tissue hematoma overlying the lower LEFT frontal bone. No underlying fracture. Slightly displaced fracture at the anterior aspects of the LEFT nasal bone. CT MAXILLOFACIAL FINDINGS Osseous: Lower frontal bones are intact and normally aligned. Osseous structures about the orbits are intact and normally aligned bilaterally. Bilateral zygomatic arches and pterygoid plates are intact. No mandible fracture or displacement seen. A focal lucency within the anterior RIGHT mandible appears to have well corticated margins indicating vessel foramen, not appreciably changed compared to the earlier facial bone CT of 12/23/2008. Incidental note made of chronic degenerative change at the RIGHT TMJ with associated mild subluxation of the mandibular condyle. Slightly displaced fracture at the anterior aspects of the LEFT nasal bone. Questionable  minimally displaced fracture at the upper margin of the nasal septum. Chronic rightward deviation of the nasal septum. Orbits: Orbital globes appear intact and symmetric in configuration. No retro-orbital hemorrhage or edema. Sinuses: Fluid levels within the bilateral maxillary sinuses, sphenoid sinus and ethmoid air cells Soft tissues: Prominent soft tissue edema/hematoma overlying the LEFT lower frontal bone, medial LEFT orbit and nasal bones. CT CERVICAL SPINE FINDINGS Alignment: No evidence of acute vertebral body subluxation. Skull base and vertebrae: No  fracture line or displaced fracture fragment. Facet joints appear intact and normally aligned throughout. Anterior cervical fusion hardware appears intact and appropriately positioned at the C5 through C7 levels. Soft tissues and spinal canal: No prevertebral fluid or swelling. No visible canal hematoma. Disc levels:  No significant central canal stenosis at any level. Upper chest: No acute findings. Other: Bilateral carotid atherosclerosis. IMPRESSION: 1. Small acute subdural hemorrhage along the anterior aspects of the interhemispheric fissure, measuring 7 mm greatest thickness, without significant mass effect. No other intracranial hemorrhage. No skull fracture. 2. 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 Additional chronic small vessel ischemic changes throughout the white matter regions bilaterally. 3. Large soft tissue hematoma overlying the LEFT lower frontal bone. No underlying fracture. 4. Slightly displaced fracture of the LEFT nasal bone, anterior aspect. Questionable minimally displaced fracture of the underlying anterior nasal septum. Associated fluid levels within the paranasal sinuses. 5. No other facial bone fracture or displacement seen. 6. No fracture or acute subluxation within the cervical spine. Anterior cervical fusion hardware within the lower cervical spine appears intact and appropriately positioned. These results were called by telephone at the time of interpretation on 09/18/2017 at 11:07 am to Dr. Isla Pence , who verbally acknowledged these results. Electronically Signed   By: Franki Cabot M.D.   On: 09/18/2017 11:09   Ct Cervical Spine Wo Contrast  Result Date: 09/18/2017 CLINICAL DATA:  Fall this morning, hematoma to forehead, bleeding from nose. EXAM: CT HEAD WITHOUT CONTRAST CT MAXILLOFACIAL WITHOUT CONTRAST CT CERVICAL SPINE WITHOUT CONTRAST TECHNIQUE: Multidetector CT imaging of the head, cervical spine, and maxillofacial structures were  performed using the standard protocol without intravenous contrast. Multiplanar CT image reconstructions of the cervical spine and maxillofacial structures were also generated. COMPARISON:  Brain MRI dated 05/02/2017. FINDINGS: CT HEAD FINDINGS Brain: Small acute subdural hemorrhage located just to the LEFT of the interhemispheric fissure, at the upper margin of the anterior falx, measuring 7 mm thickness, without significant mass effect. No additional extra-axial hemorrhage. No parenchymal hemorrhage or evidence of parenchymal edema. Chronic small vessel ischemic changes are seen throughout the bilateral periventricular and subcortical white matter regions. 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 No hydrocephalus. Vascular: There are chronic calcified atherosclerotic changes of the large vessels at the skull base. No unexpected hyperdense vessel. Skull: No skull fracture or displacement. Other: Large soft tissue hematoma overlying the lower LEFT frontal bone. No underlying fracture. Slightly displaced fracture at the anterior aspects of the LEFT nasal bone. CT MAXILLOFACIAL FINDINGS Osseous: Lower frontal bones are intact and normally aligned. Osseous structures about the orbits are intact and normally aligned bilaterally. Bilateral zygomatic arches and pterygoid plates are intact. No mandible fracture or displacement seen. A focal lucency within the anterior RIGHT mandible appears to have well corticated margins indicating vessel foramen, not appreciably changed compared to the earlier facial bone CT of 12/23/2008. Incidental note made of chronic degenerative change at the RIGHT TMJ with associated mild  subluxation of the mandibular condyle. Slightly displaced fracture at the anterior aspects of the LEFT nasal bone. Questionable minimally displaced fracture at the upper margin of the nasal septum. Chronic rightward deviation of the nasal septum. Orbits: Orbital  globes appear intact and symmetric in configuration. No retro-orbital hemorrhage or edema. Sinuses: Fluid levels within the bilateral maxillary sinuses, sphenoid sinus and ethmoid air cells Soft tissues: Prominent soft tissue edema/hematoma overlying the LEFT lower frontal bone, medial LEFT orbit and nasal bones. CT CERVICAL SPINE FINDINGS Alignment: No evidence of acute vertebral body subluxation. Skull base and vertebrae: No fracture line or displaced fracture fragment. Facet joints appear intact and normally aligned throughout. Anterior cervical fusion hardware appears intact and appropriately positioned at the C5 through C7 levels. Soft tissues and spinal canal: No prevertebral fluid or swelling. No visible canal hematoma. Disc levels:  No significant central canal stenosis at any level. Upper chest: No acute findings. Other: Bilateral carotid atherosclerosis. IMPRESSION: 1. Small acute subdural hemorrhage along the anterior aspects of the interhemispheric fissure, measuring 7 mm greatest thickness, without significant mass effect. No other intracranial hemorrhage. No skull fracture. 2. 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 Additional chronic small vessel ischemic changes throughout the white matter regions bilaterally. 3. Large soft tissue hematoma overlying the LEFT lower frontal bone. No underlying fracture. 4. Slightly displaced fracture of the LEFT nasal bone, anterior aspect. Questionable minimally displaced fracture of the underlying anterior nasal septum. Associated fluid levels within the paranasal sinuses. 5. No other facial bone fracture or displacement seen. 6. No fracture or acute subluxation within the cervical spine. Anterior cervical fusion hardware within the lower cervical spine appears intact and appropriately positioned. These results were called by telephone at the time of interpretation on 09/18/2017 at 11:07 am to Dr. Isla Pence , who verbally  acknowledged these results. Electronically Signed   By: Franki Cabot M.D.   On: 09/18/2017 11:09   Ct Maxillofacial Wo Contrast  Result Date: 09/18/2017 CLINICAL DATA:  Fall this morning, hematoma to forehead, bleeding from nose. EXAM: CT HEAD WITHOUT CONTRAST CT MAXILLOFACIAL WITHOUT CONTRAST CT CERVICAL SPINE WITHOUT CONTRAST TECHNIQUE: Multidetector CT imaging of the head, cervical spine, and maxillofacial structures were performed using the standard protocol without intravenous contrast. Multiplanar CT image reconstructions of the cervical spine and maxillofacial structures were also generated. COMPARISON:  Brain MRI dated 05/02/2017. FINDINGS: CT HEAD FINDINGS Brain: Small acute subdural hemorrhage located just to the LEFT of the interhemispheric fissure, at the upper margin of the anterior falx, measuring 7 mm thickness, without significant mass effect. No additional extra-axial hemorrhage. No parenchymal hemorrhage or evidence of parenchymal edema. Chronic small vessel ischemic changes are seen throughout the bilateral periventricular and subcortical white matter regions. 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 No hydrocephalus. Vascular: There are chronic calcified atherosclerotic changes of the large vessels at the skull base. No unexpected hyperdense vessel. Skull: No skull fracture or displacement. Other: Large soft tissue hematoma overlying the lower LEFT frontal bone. No underlying fracture. Slightly displaced fracture at the anterior aspects of the LEFT nasal bone. CT MAXILLOFACIAL FINDINGS Osseous: Lower frontal bones are intact and normally aligned. Osseous structures about the orbits are intact and normally aligned bilaterally. Bilateral zygomatic arches and pterygoid plates are intact. No mandible fracture or displacement seen. A focal lucency within the anterior RIGHT mandible appears to have well corticated margins indicating vessel foramen,  not appreciably changed compared to the earlier facial  bone CT of 12/23/2008. Incidental note made of chronic degenerative change at the RIGHT TMJ with associated mild subluxation of the mandibular condyle. Slightly displaced fracture at the anterior aspects of the LEFT nasal bone. Questionable minimally displaced fracture at the upper margin of the nasal septum. Chronic rightward deviation of the nasal septum. Orbits: Orbital globes appear intact and symmetric in configuration. No retro-orbital hemorrhage or edema. Sinuses: Fluid levels within the bilateral maxillary sinuses, sphenoid sinus and ethmoid air cells Soft tissues: Prominent soft tissue edema/hematoma overlying the LEFT lower frontal bone, medial LEFT orbit and nasal bones. CT CERVICAL SPINE FINDINGS Alignment: No evidence of acute vertebral body subluxation. Skull base and vertebrae: No fracture line or displaced fracture fragment. Facet joints appear intact and normally aligned throughout. Anterior cervical fusion hardware appears intact and appropriately positioned at the C5 through C7 levels. Soft tissues and spinal canal: No prevertebral fluid or swelling. No visible canal hematoma. Disc levels:  No significant central canal stenosis at any level. Upper chest: No acute findings. Other: Bilateral carotid atherosclerosis. IMPRESSION: 1. Small acute subdural hemorrhage along the anterior aspects of the interhemispheric fissure, measuring 7 mm greatest thickness, without significant mass effect. No other intracranial hemorrhage. No skull fracture. 2. 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 Additional chronic small vessel ischemic changes throughout the white matter regions bilaterally. 3. Large soft tissue hematoma overlying the LEFT lower frontal bone. No underlying fracture. 4. Slightly displaced fracture of the LEFT nasal bone, anterior aspect. Questionable minimally displaced fracture of the underlying anterior  nasal septum. Associated fluid levels within the paranasal sinuses. 5. No other facial bone fracture or displacement seen. 6. No fracture or acute subluxation within the cervical spine. Anterior cervical fusion hardware within the lower cervical spine appears intact and appropriately positioned. These results were called by telephone at the time of interpretation on 09/18/2017 at 11:07 am to Dr. Isla Pence , who verbally acknowledged these results. Electronically Signed   By: Franki Cabot M.D.   On: 09/18/2017 11:09     Assessment/Plan: Spoke with patient, her 2 daughters who are at the bedside, and Dr. Gilford Raid.  Regards the small acute subdural hematoma, no neurosurgical intervention is indicated at this time.  I do feel that the aspirin and Plavix need to be discontinued, hopefully for no longer than 2 weeks.  She will need a follow-up CT of the brain without contrast on the morning of June 4.  I have placed the order.  She needs to be monitored with serial neuro checks and therefore will be best admitted to the medical service in consideration of her age and underlying medical conditions.  It may be beneficial for her to be seen by Dr. Peter Martinique or 1 of his associates.  If the follow-up CT on June 4 is stable, she should be able to be discharged home, if otherwise stable, and we will want her to return for follow-up in our office on June 17 with a repeat follow-up CT of the brain without contrast that morning (since I will be out of town, she will need to see 1 of my partners).  I have explained to the patient and her daughters, that stopping the aspirin and Plavix does place the stent at risk of occlusion, but that continuing the aspirin and Plavix puts her at risk for significant expansion of the acute subdural hematoma.  They understand the competing risks in this medical situation.   Hosie Spangle, MD 09/18/2017, 12:17 PM

## 2017-09-18 NOTE — ED Triage Notes (Signed)
Attempted report x1. 

## 2017-09-18 NOTE — ED Triage Notes (Signed)
Patient arrived by [facility] following trip and fall inside her home this am. [address] forward striking head and face. No loc. Patient arrived with hematoma to forehead and bleeding from right nare. Large clot noted to right nare but patient unable to blow her nose. Alert and oriented. Also complains of chronic neck pain due to surgical hx of neck. Neuro and vascular intact.

## 2017-09-18 NOTE — H&P (Addendum)
TRH H&P   Patient Demographics:    Valerie Ramirez, is a 84 y.o. female  MRN: [id number]   DOB - [birth year]  Admit Date - 09/18/2017  Outpatient Primary MD for the patient is Tower, Wynelle Fanny, MD  Referring MD/NP/PA: Isla Pence  Outpatient Specialists:  Peter Martinique Patient coming from: home  No chief complaint on file. Fall    HPI:    Valerie Ramirez  is a 84 y.o. female, w hypothyroidism, hypertension, hyperlipidemia, CAD s/p DES Lcx 04/06/2017 [facility] Anon), apparently was going into kitchen and tripped on a step and hit her face on the floor.  Pt presented for epistaxis and facial trauma.   In ED,  CT brain IMPRESSION: 1. Small acute subdural hemorrhage along the anterior aspects of the interhemispheric fissure, measuring 7 mm greatest thickness, without significant mass effect. No other intracranial hemorrhage. No skull fracture. 2. 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 Additional chronic small vessel ischemic changes throughout the white matter regions bilaterally. 3. Large soft tissue hematoma overlying the LEFT lower frontal bone. No underlying fracture. 4. Slightly displaced fracture of the LEFT nasal bone, anterior aspect. Questionable minimally displaced fracture of the underlying anterior nasal septum. Associated fluid levels within the paranasal sinuses. 5. No other facial bone fracture or displacement seen. 6. No fracture or acute subluxation within the cervical spine. Anterior cervical fusion hardware within the lower cervical spine appears intact and appropriately positioned.   Na 138, K 4.2,  Bun 36, K 1.44 Calcium 8.7,  Wbc 10.9, Hgb 8.9, Plt 200 INR 1.06  Neurosurgery consulted by ED, recommended stopping aspirin and plavix.   ENT consulted by ED, pending for ? Nasal fracture.   Pt will be admitted for  subdural hematoma and ? Nasal fracture.     Review of systems:    In addition to the HPI above,  No Fever-chills, No Headache, No changes with Vision or hearing, No problems swallowing food or Liquids, No Chest pain, Cough or Shortness of Breath, No Abdominal pain, No Nausea or Vommitting, Bowel movements are regular, No Blood in stool or Urine, No dysuria, No new skin rashes or bruises, No new joints pains-aches,  No new weakness, tingling, numbness in any extremity, No recent weight gain or loss, No polyuria, polydypsia or polyphagia, No significant Mental Stressors. No syncope  A full 10 point Review of Systems was done, except as stated above, all other Review of Systems were negative.   With Past History of the following :    Past Medical History:  Diagnosis Date  . Arthritis    OA  . CAD (coronary artery disease)   . Cancer ([place])    endometrial  . GERD (gastroesophageal reflux disease)   . Hearing loss   . History of kidney stones   . Hyperlipidemia   . Hypertension   . Hypothyroid    "  not in years"  . Shortness of breath dyspnea    04/25/17- not anymore      Past Surgical History:  Procedure Laterality Date  . ABDOMINAL HYSTERECTOMY    . ANTERIOR CERVICAL DECOMP/DISCECTOMY FUSION [name] 08/18/2015   Procedure: C5-6, C6-7 Anterior Cervical Discectomy and Fusion, Allograft, Plate;  Surgeon: Marybelle Killings, MD;  Location: [place];  Service: Orthopedics;  Laterality: [name];  . bone spur removed Right    shoulder  . BOWEL RESECTION  07/04/2012   Procedure: SMALL BOWEL RESECTION;  Surgeon: Alvino Chapel, MD;  Location: [facility];  Service: Gynecology;;  . CHOLECYSTECTOMY    . CORONARY STENT INTERVENTION [name] 04/06/2017   Procedure: CORONARY STENT INTERVENTION;  Surgeon: Wellington Hampshire, MD;  Location: [place] [facility];  Service: Cardiovascular;  Laterality: [name];  . DILATION AND CURETTAGE OF UTERUS  1999  . EYE SURGERY Bilateral    cataracts  . 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 HAND SURGERY  12/2008   after hand fx/due to fall  . JOINT REPLACEMENT Bilateral 07/1998   knees  . KNEE ARTHROPLASTY  05/23/1998   total right  . LAPAROTOMY Bilateral 07/04/2012   Procedure: EXPLORATORY LAPAROTOMY TOTAL ABDOMINAL HYSTERECTOMY BILATERAL SALPINGO-OOPHORECTOMY with small bowel resection ;  Surgeon: Alvino Chapel, MD;  Location: [facility];  Service: Gynecology;  Laterality: Bilateral;  . LEFT HEART CATH AND CORONARY ANGIOGRAPHY [name] 04/06/2017   Procedure: LEFT HEART CATH AND CORONARY ANGIOGRAPHY;  Surgeon: Jolaine Artist, MD;  Location: [place] [facility];  Service: Cardiovascular;  Laterality: [name];  . polyp removal  1999  . TUBAL LIGATION        Social History:     Social History   Tobacco Use  . Smoking status: Never Smoker  . Smokeless tobacco: Never Used  Substance Use Topics  . Alcohol use: No    Alcohol/week: 0.0 oz     Lives - at home  Mobility - walks by self   Family History :     Family History  Problem Relation Age of Onset  . Cancer Brother        LEUKEMIA  . Cancer Brother        LUNG  . Cancer Brother        STOMACH  . Heart disease Sister        CABG  . Stroke Sister   . Heart disease Brother        CABG      Home Medications:   Prior to Admission medications   Medication Sig Start Date End Date Taking? Authorizing Provider  acetaminophen (TYLENOL) 650 MG CR tablet Take 650 mg by mouth every 8 (eight) hours as needed for pain.   Yes [provider]  amLODipine (NORVASC) 10 MG tablet Take 1 tablet (10 mg total) by mouth daily. 04/22/17  Yes Almyra Deforest, PA  aspirin 81 MG chewable tablet Chew 1 tablet (81 mg total) by mouth daily. 04/10/17  Yes [name], [name], PA-C  carvedilol (COREG) 25 MG tablet Take 1 tablet (25 mg total) by mouth 2 (two)  times daily with a meal. 04/09/17  Yes [name], [name], PA-C  clopidogrel (PLAVIX) 75 MG tablet Take 1 tablet (75 mg total) by mouth daily with breakfast. 04/10/17  Yes [name], [name], PA-C  colchicine 0.6  MG tablet Take 1 tablet (0.6 mg total) by mouth 2 (two) times daily. 08/03/17  Yes Tower, Wynelle Fanny, MD  doxazosin (CARDURA) 2 MG tablet TAKE 1 TABLET (8 MG TOTAL) BY MOUTH AT BEDTIME. Patient taking differently: 2 mg. TAKE 1 TABLET (8 MG TOTAL) BY MOUTH AT BEDTIME. 05/11/17  Yes Martinique, Peter M, MD  DULoxetine (CYMBALTA) 20 MG capsule TAKE 1 CAPSULE (20 MG TOTAL) BY MOUTH DAILY. 08/03/17  Yes Tower, Wynelle Fanny, MD  lisinopril (PRINIVIL,ZESTRIL) 5 MG tablet Take 1 tablet (5 mg total) by mouth daily. 08/15/17 08/10/18 Yes Martinique, Peter M, MD  loperamide (IMODIUM) 2 MG capsule Take 1 capsule (2 mg total) by mouth as needed for diarrhea or loose stools. 04/29/16  Yes Eugenie Filler, MD  losartan (COZAAR) 25 MG tablet Take 25 mg by mouth daily.   Yes [provider]  nitroGLYCERIN (NITROSTAT) 0.4 MG SL tablet Place 1 tablet (0.4 mg total) under the tongue every 5 (five) minutes x 3 doses as needed for chest pain. 04/09/17  Yes [name], [name], PA-C  omega-3 acid ethyl esters (LOVAZA) 1 g capsule Take 1 capsule (1 g total) by mouth 2 (two) times daily. 05/26/17  Yes Almyra Deforest, PA  rosuvastatin (CRESTOR) 10 MG tablet Take 1 tablet (10 mg total) by mouth daily at 6 PM. 04/09/17  Yes [name], [name], PA-C  vitamin B-12 (CYANOCOBALAMIN) 1000 MCG tablet Take 1,000 mcg by mouth daily.   Yes [provider]  HYDROcodone-acetaminophen (NORCO/VICODIN) 5-325 MG tablet Take 1-2 tablets by mouth every 6 (six) hours as needed. Patient not taking: Reported on 09/18/2017 04/22/17   Almyra Deforest, PA  traMADol (ULTRAM) 50 MG tablet Take 1 tablet (50 mg total) by mouth every 6 (six) hours as needed. Patient not taking: Reported on 09/18/2017 04/26/17 04/26/18  Dagoberto Ligas, PA-C     Allergies:       Allergies  Allergen Reactions  . Allopurinol Nausea Only  . Lipitor [Atorvastatin Calcium] Other (See Comments)    Leg ache and ache all over  . Simvastatin Other (See Comments)    REACTION: muscle pain  . Tape Itching and Rash    "Cannot use paper tape" --- also causes blisters. Use plastic tape     Physical Exam:   Vitals  Blood pressure (!) 108/53, pulse 73, temperature 98 F (36.7 C), temperature source Oral, resp. rate 18, SpO2 99 %.   1. General  lying in bed in NAD,    2. Normal affect and insight, Not Suicidal or Homicidal, Awake Alert, Oriented X 3.  3. No F.N deficits, ALL C.Nerves Intact, Strength 5/5 all 4 extremities, Sensation intact all 4 extremities, Plantars down going.  4. Ears and Eyes appear Normal, Conjunctivae clear, PERRLA. Moist Oral Mucosa.  5. Supple Neck, No JVD, No cervical lymphadenopathy appriciated, No Carotid Bruits.  6. Symmetrical Chest wall movement, Good air movement bilaterally, CTAB.  7. RRR, No Gallops, Rubs or Murmurs, No Parasternal Heave.  8. Positive Bowel Sounds, Abdomen Soft, No tenderness, No organomegaly appriciated,No rebound -guarding or rigidity.  9.  No Cyanosis,  Hematoma on forehead, 5cm and bruising around her eyes, and and nose  10. Good muscle tone,  joints appear normal , no effusions, Normal ROM.  11. No Palpable Lymph Nodes in Neck or Axillae     Data Review:    CBC Recent Labs  Lab 09/18/17 1135  WBC 10.9*  HGB 8.9*  HCT 29.3*  PLT 200  MCV 91.0  MCH 27.6  MCHC 30.4  RDW 17.5*  LYMPHSABS 1.7  MONOABS 1.3*  EOSABS 0.0  BASOSABS 0.1   ------------------------------------------------------------------------------------------------------------------  Chemistries  Recent Labs  Lab 09/18/17 1135  NA 138  K 4.2  CL 108  CO2 22  GLUCOSE 117*  BUN 36*  CREATININE 1.44*  CALCIUM 8.7*    ------------------------------------------------------------------------------------------------------------------ CrCl cannot be calculated (Unknown ideal weight.). ------------------------------------------------------------------------------------------------------------------ No results for input(s): TSH, T4TOTAL, T3FREE, THYROIDAB in the last 72 hours.  Invalid input(s): FREET3  Coagulation profile Recent Labs  Lab 09/18/17 1135  INR 1.06   ------------------------------------------------------------------------------------------------------------------- No results for input(s): DDIMER in the last 72 hours. -------------------------------------------------------------------------------------------------------------------  Cardiac Enzymes No results for input(s): CKMB, TROPONINI, MYOGLOBIN in the last 168 hours.  Invalid input(s): CK ------------------------------------------------------------------------------------------------------------------ No results found for: BNP   ---------------------------------------------------------------------------------------------------------------  Urinalysis    Component Value Date/Time   COLORURINE AMBER (A) 04/25/2016 1704   APPEARANCEUR HAZY (A) 04/25/2016 1704   LABSPEC 1.025 04/25/2016 1704   PHURINE 5.0 04/25/2016 1704   GLUCOSEU NEGATIVE 04/25/2016 1704   HGBUR NEGATIVE 04/25/2016 1704   BILIRUBINUR Negative 09/15/2016 1214   KETONESUR NEGATIVE 04/25/2016 1704   PROTEINUR 15 mg/dL 09/15/2016 1214   PROTEINUR 100 (A) 04/25/2016 1704   UROBILINOGEN 0.2 09/15/2016 1214   UROBILINOGEN 1.0 06/20/2014 1030   NITRITE Negative 09/15/2016 1214   NITRITE NEGATIVE 04/25/2016 1704   LEUKOCYTESUR Negative 09/15/2016 1214    ----------------------------------------------------------------------------------------------------------------   Imaging Results:    Ct Head Wo Contrast  Result Date: 09/18/2017 CLINICAL DATA:  Fall this  morning, hematoma to forehead, bleeding from nose. EXAM: CT HEAD WITHOUT CONTRAST CT MAXILLOFACIAL WITHOUT CONTRAST CT CERVICAL SPINE WITHOUT CONTRAST TECHNIQUE: Multidetector CT imaging of the head, cervical spine, and maxillofacial structures were performed using the standard protocol without intravenous contrast. Multiplanar CT image reconstructions of the cervical spine and maxillofacial structures were also generated. COMPARISON:  Brain MRI dated 05/02/2017. FINDINGS: CT HEAD FINDINGS Brain: Small acute subdural hemorrhage located just to the LEFT of the interhemispheric fissure, at the upper margin of the anterior falx, measuring 7 mm thickness, without significant mass effect. No additional extra-axial hemorrhage. No parenchymal hemorrhage or evidence of parenchymal edema. Chronic small vessel ischemic changes are seen throughout the bilateral periventricular and subcortical white matter regions. 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 No hydrocephalus. Vascular: There are chronic calcified atherosclerotic changes of the large vessels at the skull base. No unexpected hyperdense vessel. Skull: No skull fracture or displacement. Other: Large soft tissue hematoma overlying the lower LEFT frontal bone. No underlying fracture. Slightly displaced fracture at the anterior aspects of the LEFT nasal bone. CT MAXILLOFACIAL FINDINGS Osseous: Lower frontal bones are intact and normally aligned. Osseous structures about the orbits are intact and normally aligned bilaterally. Bilateral zygomatic arches and pterygoid plates are intact. No mandible fracture or displacement seen. A focal lucency within the anterior RIGHT mandible appears to have well corticated margins indicating vessel foramen, not appreciably changed compared to the earlier facial bone CT of 12/23/2008. Incidental note made of chronic degenerative change at the RIGHT TMJ with associated mild subluxation of  the mandibular condyle. Slightly displaced fracture at the anterior aspects of the LEFT nasal bone. Questionable minimally displaced fracture at the upper margin of the nasal septum. Chronic rightward deviation of the nasal septum. Orbits: Orbital globes appear intact and symmetric in configuration. No retro-orbital hemorrhage or edema. Sinuses: Fluid levels within the bilateral maxillary sinuses, sphenoid sinus and ethmoid air  cells Soft tissues: Prominent soft tissue edema/hematoma overlying the LEFT lower frontal bone, medial LEFT orbit and nasal bones. CT CERVICAL SPINE FINDINGS Alignment: No evidence of acute vertebral body subluxation. Skull base and vertebrae: No fracture line or displaced fracture fragment. Facet joints appear intact and normally aligned throughout. Anterior cervical fusion hardware appears intact and appropriately positioned at the C5 through C7 levels. Soft tissues and spinal canal: No prevertebral fluid or swelling. No visible canal hematoma. Disc levels:  No significant central canal stenosis at any level. Upper chest: No acute findings. Other: Bilateral carotid atherosclerosis. IMPRESSION: 1. Small acute subdural hemorrhage along the anterior aspects of the interhemispheric fissure, measuring 7 mm greatest thickness, without significant mass effect. No other intracranial hemorrhage. No skull fracture. 2. 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 Additional chronic small vessel ischemic changes throughout the white matter regions bilaterally. 3. Large soft tissue hematoma overlying the LEFT lower frontal bone. No underlying fracture. 4. Slightly displaced fracture of the LEFT nasal bone, anterior aspect. Questionable minimally displaced fracture of the underlying anterior nasal septum. Associated fluid levels within the paranasal sinuses. 5. No other facial bone fracture or displacement seen. 6. No fracture or acute subluxation within the cervical  spine. Anterior cervical fusion hardware within the lower cervical spine appears intact and appropriately positioned. These results were called by telephone at the time of interpretation on 09/18/2017 at 11:07 am to Dr. Isla Pence , who verbally acknowledged these results. Electronically Signed   By: Franki Cabot M.D.   On: 09/18/2017 11:09   Ct Cervical Spine Wo Contrast  Result Date: 09/18/2017 CLINICAL DATA:  Fall this morning, hematoma to forehead, bleeding from nose. EXAM: CT HEAD WITHOUT CONTRAST CT MAXILLOFACIAL WITHOUT CONTRAST CT CERVICAL SPINE WITHOUT CONTRAST TECHNIQUE: Multidetector CT imaging of the head, cervical spine, and maxillofacial structures were performed using the standard protocol without intravenous contrast. Multiplanar CT image reconstructions of the cervical spine and maxillofacial structures were also generated. COMPARISON:  Brain MRI dated 05/02/2017. FINDINGS: CT HEAD FINDINGS Brain: Small acute subdural hemorrhage located just to the LEFT of the interhemispheric fissure, at the upper margin of the anterior falx, measuring 7 mm thickness, without significant mass effect. No additional extra-axial hemorrhage. No parenchymal hemorrhage or evidence of parenchymal edema. Chronic small vessel ischemic changes are seen throughout the bilateral periventricular and subcortical white matter regions. 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 No hydrocephalus. Vascular: There are chronic calcified atherosclerotic changes of the large vessels at the skull base. No unexpected hyperdense vessel. Skull: No skull fracture or displacement. Other: Large soft tissue hematoma overlying the lower LEFT frontal bone. No underlying fracture. Slightly displaced fracture at the anterior aspects of the LEFT nasal bone. CT MAXILLOFACIAL FINDINGS Osseous: Lower frontal bones are intact and normally aligned. Osseous structures about the orbits are intact and  normally aligned bilaterally. Bilateral zygomatic arches and pterygoid plates are intact. No mandible fracture or displacement seen. A focal lucency within the anterior RIGHT mandible appears to have well corticated margins indicating vessel foramen, not appreciably changed compared to the earlier facial bone CT of 12/23/2008. Incidental note made of chronic degenerative change at the RIGHT TMJ with associated mild subluxation of the mandibular condyle. Slightly displaced fracture at the anterior aspects of the LEFT nasal bone. Questionable minimally displaced fracture at the upper margin of the nasal septum. Chronic rightward deviation of the nasal septum. Orbits: Orbital globes appear intact and symmetric in configuration.  No retro-orbital hemorrhage or edema. Sinuses: Fluid levels within the bilateral maxillary sinuses, sphenoid sinus and ethmoid air cells Soft tissues: Prominent soft tissue edema/hematoma overlying the LEFT lower frontal bone, medial LEFT orbit and nasal bones. CT CERVICAL SPINE FINDINGS Alignment: No evidence of acute vertebral body subluxation. Skull base and vertebrae: No fracture line or displaced fracture fragment. Facet joints appear intact and normally aligned throughout. Anterior cervical fusion hardware appears intact and appropriately positioned at the C5 through C7 levels. Soft tissues and spinal canal: No prevertebral fluid or swelling. No visible canal hematoma. Disc levels:  No significant central canal stenosis at any level. Upper chest: No acute findings. Other: Bilateral carotid atherosclerosis. IMPRESSION: 1. Small acute subdural hemorrhage along the anterior aspects of the interhemispheric fissure, measuring 7 mm greatest thickness, without significant mass effect. No other intracranial hemorrhage. No skull fracture. 2. 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 Additional chronic small vessel ischemic changes throughout the white matter  regions bilaterally. 3. Large soft tissue hematoma overlying the LEFT lower frontal bone. No underlying fracture. 4. Slightly displaced fracture of the LEFT nasal bone, anterior aspect. Questionable minimally displaced fracture of the underlying anterior nasal septum. Associated fluid levels within the paranasal sinuses. 5. No other facial bone fracture or displacement seen. 6. No fracture or acute subluxation within the cervical spine. Anterior cervical fusion hardware within the lower cervical spine appears intact and appropriately positioned. These results were called by telephone at the time of interpretation on 09/18/2017 at 11:07 am to Dr. Isla Pence , who verbally acknowledged these results. Electronically Signed   By: Franki Cabot M.D.   On: 09/18/2017 11:09   Ct Maxillofacial Wo Contrast  Result Date: 09/18/2017 CLINICAL DATA:  Fall this morning, hematoma to forehead, bleeding from nose. EXAM: CT HEAD WITHOUT CONTRAST CT MAXILLOFACIAL WITHOUT CONTRAST CT CERVICAL SPINE WITHOUT CONTRAST TECHNIQUE: Multidetector CT imaging of the head, cervical spine, and maxillofacial structures were performed using the standard protocol without intravenous contrast. Multiplanar CT image reconstructions of the cervical spine and maxillofacial structures were also generated. COMPARISON:  Brain MRI dated 05/02/2017. FINDINGS: CT HEAD FINDINGS Brain: Small acute subdural hemorrhage located just to the LEFT of the interhemispheric fissure, at the upper margin of the anterior falx, measuring 7 mm thickness, without significant mass effect. No additional extra-axial hemorrhage. No parenchymal hemorrhage or evidence of parenchymal edema. Chronic small vessel ischemic changes are seen throughout the bilateral periventricular and subcortical white matter regions. 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 No hydrocephalus. Vascular: There are chronic calcified atherosclerotic  changes of the large vessels at the skull base. No unexpected hyperdense vessel. Skull: No skull fracture or displacement. Other: Large soft tissue hematoma overlying the lower LEFT frontal bone. No underlying fracture. Slightly displaced fracture at the anterior aspects of the LEFT nasal bone. CT MAXILLOFACIAL FINDINGS Osseous: Lower frontal bones are intact and normally aligned. Osseous structures about the orbits are intact and normally aligned bilaterally. Bilateral zygomatic arches and pterygoid plates are intact. No mandible fracture or displacement seen. A focal lucency within the anterior RIGHT mandible appears to have well corticated margins indicating vessel foramen, not appreciably changed compared to the earlier facial bone CT of 12/23/2008. Incidental note made of chronic degenerative change at the RIGHT TMJ with associated mild subluxation of the mandibular condyle. Slightly displaced fracture at the anterior aspects of the LEFT nasal bone. Questionable minimally displaced fracture at the upper margin of the nasal  septum. Chronic rightward deviation of the nasal septum. Orbits: Orbital globes appear intact and symmetric in configuration. No retro-orbital hemorrhage or edema. Sinuses: Fluid levels within the bilateral maxillary sinuses, sphenoid sinus and ethmoid air cells Soft tissues: Prominent soft tissue edema/hematoma overlying the LEFT lower frontal bone, medial LEFT orbit and nasal bones. CT CERVICAL SPINE FINDINGS Alignment: No evidence of acute vertebral body subluxation. Skull base and vertebrae: No fracture line or displaced fracture fragment. Facet joints appear intact and normally aligned throughout. Anterior cervical fusion hardware appears intact and appropriately positioned at the C5 through C7 levels. Soft tissues and spinal canal: No prevertebral fluid or swelling. No visible canal hematoma. Disc levels:  No significant central canal stenosis at any level. Upper chest: No acute  findings. Other: Bilateral carotid atherosclerosis. IMPRESSION: 1. Small acute subdural hemorrhage along the anterior aspects of the interhemispheric fissure, measuring 7 mm greatest thickness, without significant mass effect. No other intracranial hemorrhage. No skull fracture. 2. 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 Additional chronic small vessel ischemic changes throughout the white matter regions bilaterally. 3. Large soft tissue hematoma overlying the LEFT lower frontal bone. No underlying fracture. 4. Slightly displaced fracture of the LEFT nasal bone, anterior aspect. Questionable minimally displaced fracture of the underlying anterior nasal septum. Associated fluid levels within the paranasal sinuses. 5. No other facial bone fracture or displacement seen. 6. No fracture or acute subluxation within the cervical spine. Anterior cervical fusion hardware within the lower cervical spine appears intact and appropriately positioned. These results were called by telephone at the time of interpretation on 09/18/2017 at 11:07 am to Dr. Isla Pence , who verbally acknowledged these results. Electronically Signed   By: Franki Cabot M.D.   On: 09/18/2017 11:09     Assessment & Plan:    Principal Problem:   Subdural hematoma (HCC) Active Problems:   Essential hypertension   CKD (chronic kidney disease), stage III (HCC)   CAD (coronary artery disease)   Subdural hematoma NO Asprin, NO Plavix Neurosurgery input appreciated D/w cardiology Dr. Aundra Dubin who said ok to be off plavix and aspirin as long as needed by neurosurgery  Epistaxis and ? Nasal fracture Appreciate ENT input (ED consulted Dr. Erik Obey)  CAD Cont carvedilol 25mg  po bid Cont losartan 25mg  po qday Cont Amlodipine at 5mg  po qday (decrease in dose) Since bp slightly soft  CKD stage3 Check cmp in am  Gout Cont colchicine  Anemia Check cbc in am  DVT Prophylaxis  SCDs   AM Labs  Ordered, also please review Full Orders  Family Communication: Admission, patients condition and plan of care including tests being ordered have been discussed with the patient who indicate understanding and agree with the plan and Code Status.  Code Status FULL CODE  Likely DC to  home  Condition GUARDED    Consults called: ENT by ED, neurosurgery by ED, cardiology Dr. Aundra Dubin  (verbal)  Admission status: inpatient  Time spent in minutes : 45  Jani Gravel M.D on 09/18/2017 at 2:13 PM  Between 7am to 7pm - Pager - [phone]  After 7pm go to [email or web address] - password [facility]  [facility] - Office  [phone]

## 2017-09-18 NOTE — Discharge Instructions (Signed)
Stay on antibiotics until we remove the RIGHT nasal pack OK to blow nose to dislodge LEFT blood clots OK to rinse throat with cool dilute salt water to clear old blood Recheck my office 3-4 days for packing removal No aspirin or Plavix until Dr. Sherwood Gambler gives the [place].

## 2017-09-18 NOTE — ED Provider Notes (Signed)
[facility] EMERGENCY DEPARTMENT Provider Note   CSN: [id number] Arrival date & time: 09/18/17  4098     History   Chief Complaint No chief complaint on file.   HPI Valerie Ramirez is a 84 y.o. female.  Pt presents to the ED today with fall and facial trauma.  The pt said she stubbed her toe and fell forward.  The pt hit her forehead and her nose.  She is bleeding from her right nare.  She is on plavix.  The pt c/o facial and neck pain.      Past Medical History:  Diagnosis Date  . Arthritis    OA  . CAD (coronary artery disease)   . Cancer ([place])    endometrial  . GERD (gastroesophageal reflux disease)   . Hearing loss   . History of kidney stones   . Hyperlipidemia   . Hypertension   . Hypothyroid    "not in years"  . Shortness of breath dyspnea    04/25/17- not anymore    Patient Active Problem List   Diagnosis Date Noted  . Subdural hematoma ([place]) 09/18/2017  . CAD (coronary artery disease) 09/18/2017  . Elevated transaminase level 08/03/2017  . Radial artery injury, left, sequela 06/15/2017  . H/O: CVA (cerebrovascular accident) 05/26/2017  . Sick sinus syndrome ([place]) 05/25/2017  . Unstable angina ([place])   . Exertional chest pain 04/05/2017  . CKD (chronic kidney disease), stage III ([place]) 04/05/2017  . Normocytic normochromic anemia 04/05/2017  . Thoracic back pain 09/15/2016  . History of radius fracture 06/01/2016  . AKI (acute kidney injury) ([place]) 04/25/2016  . Status post cervical spinal fusion 08/18/2015  . Osteoarthritis, hip, bilateral 08/12/2014  . Hearing loss in right ear 06/12/2014  . Colon cancer screening 08/10/2013  . Seborrheic keratoses, inflamed 08/10/2013  . History of fall 04/10/2013  . Encounter for Medicare annual wellness exam 08/08/2012  . Gout 07/14/2012  . History of endometrial cancer 05/25/2012  . Degenerative joint disease of cervical spine 05/03/2011  . Other screening mammogram 03/31/2011  .  Hyperglycemia 09/26/2007  . Morbid obesity ([place]) 07/12/2007  . Essential hypertension 07/11/2007  . History of cardiomyopathy 07/11/2007  . Osteoarthritis 07/11/2007  . 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 Hypothyroidism 10/05/2006  . HYPERCHOLESTEROLEMIA, PURE 10/05/2006    Past Surgical History:  Procedure Laterality Date  . ABDOMINAL HYSTERECTOMY    . ANTERIOR CERVICAL DECOMP/DISCECTOMY FUSION [name] 08/18/2015   Procedure: C5-6, C6-7 Anterior Cervical Discectomy and Fusion, Allograft, Plate;  Surgeon: Marybelle Killings, MD;  Location: [facility];  Service: Orthopedics;  Laterality: [name];  . bone spur removed Right    shoulder  . BOWEL RESECTION  07/04/2012   Procedure: SMALL BOWEL RESECTION;  Surgeon: Alvino Chapel, MD;  Location: [facility];  Service: Gynecology;;  . CHOLECYSTECTOMY    . CORONARY STENT INTERVENTION [name] 04/06/2017   Procedure: CORONARY STENT INTERVENTION;  Surgeon: Wellington Hampshire, MD;  Location: [facility];  Service: Cardiovascular;  Laterality: [name];  . DILATION AND CURETTAGE OF UTERUS  1999  . EYE SURGERY Bilateral    cataracts  . FALSE ANEURYSM REPAIR Right 04/26/2017   Procedure: 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;  Surgeon: Conrad Franklin, MD;  Location: [facility];  Service: Vascular;  Laterality: Right;  . HAND SURGERY  12/2008   after hand fx/due to fall  . JOINT REPLACEMENT Bilateral 07/1998   knees  . KNEE ARTHROPLASTY  05/23/1998  total right  . LAPAROTOMY Bilateral 07/04/2012   Procedure: EXPLORATORY LAPAROTOMY TOTAL ABDOMINAL HYSTERECTOMY BILATERAL SALPINGO-OOPHORECTOMY with small bowel resection ;  Surgeon: Alvino Chapel, MD;  Location: [facility];  Service: Gynecology;  Laterality: Bilateral;  . LEFT HEART CATH AND CORONARY ANGIOGRAPHY [name] 04/06/2017   Procedure: LEFT HEART CATH AND CORONARY ANGIOGRAPHY;  Surgeon: Jolaine Artist, MD;  Location: [facility];  Service: Cardiovascular;  Laterality: [name];  . polyp  removal  1999  . TUBAL LIGATION       OB History    Gravida  5   Para  2   Term      Preterm      AB  3   Living  2     SAB  3   TAB      Ectopic      Multiple      Live Births               Home Medications    Prior to Admission medications   Medication Sig Start Date End Date Taking? Authorizing Provider  acetaminophen (TYLENOL) 650 MG CR tablet Take 650 mg by mouth every 8 (eight) hours as needed for pain.    [provider]  amLODipine (NORVASC) 10 MG tablet Take 1 tablet (10 mg total) by mouth daily. 04/22/17   Almyra Deforest, PA  aspirin 81 MG chewable tablet Chew 1 tablet (81 mg total) by mouth daily. 04/10/17   [name], [name], PA-C  carvedilol (COREG) 25 MG tablet Take 1 tablet (25 mg total) by mouth 2 (two) times daily with a meal. 04/09/17   [name], [name], PA-C  clopidogrel (PLAVIX) 75 MG tablet Take 1 tablet (75 mg total) by mouth daily with breakfast. 04/10/17   [name], [name], PA-C  colchicine 0.6 MG tablet Take 1 tablet (0.6 mg total) by mouth 2 (two) times daily. 08/03/17   Tower, Wynelle Fanny, MD  cyclobenzaprine (FLEXERIL) 10 MG tablet Take 10 mg by mouth 3 (three) times daily as needed for muscle spasms.    [provider]  doxazosin (CARDURA) 2 MG tablet TAKE 1 TABLET (8 MG TOTAL) BY MOUTH AT BEDTIME. 05/11/17   Martinique, Peter M, MD  DULoxetine (CYMBALTA) 20 MG capsule TAKE 1 CAPSULE (20 MG TOTAL) BY MOUTH DAILY. 08/03/17   Tower, Wynelle Fanny, MD  ECHINACEA PO Take 760 mg by mouth 2 (two) times daily.    [provider]  HYDROcodone-acetaminophen (NORCO/VICODIN) 5-325 MG tablet Take 1-2 tablets by mouth every 6 (six) hours as needed. 04/22/17   Almyra Deforest, PA  lisinopril (PRINIVIL,ZESTRIL) 5 MG tablet Take 1 tablet (5 mg total) by mouth daily. 08/15/17 08/10/18  Martinique, Peter M, MD  loperamide (IMODIUM) 2 MG capsule Take 1 capsule (2 mg total) by mouth as needed for diarrhea or loose stools. 04/29/16   Eugenie Filler, MD    nitroGLYCERIN (NITROSTAT) 0.4 MG SL tablet Place 1 tablet (0.4 mg total) under the tongue every 5 (five) minutes x 3 doses as needed for chest pain. 04/09/17   [name], [name], PA-C  omega-3 acid ethyl esters (LOVAZA) 1 g capsule Take 1 capsule (1 g total) by mouth 2 (two) times daily. 05/26/17   Almyra Deforest, PA  rosuvastatin (CRESTOR) 10 MG tablet Take 1 tablet (10 mg total) by mouth daily at 6 PM. 04/09/17   [name], [name], PA-C  traMADol (ULTRAM) 50 MG tablet Take 1 tablet (50 mg total) by mouth  every 6 (six) hours as needed. 04/26/17 04/26/18  Dagoberto Ligas, PA-C  vitamin B-12 (CYANOCOBALAMIN) 1000 MCG tablet Take 1,000 mcg by mouth daily.    [provider]    Family History Family History  Problem Relation Age of Onset  . Cancer Brother        LEUKEMIA  . Cancer Brother        LUNG  . Cancer Brother        STOMACH  . Heart disease Sister        CABG  . Stroke Sister   . Heart disease Brother        CABG    Social History Social History   Tobacco Use  . Smoking status: Never Smoker  . Smokeless tobacco: Never Used  Substance Use Topics  . Alcohol use: No    Alcohol/week: 0.0 oz  . Drug use: No     Allergies   Allopurinol; Lipitor [atorvastatin calcium]; Simvastatin; and Tape   Review of Systems Review of Systems  HENT: Positive for facial swelling and nosebleeds.   Musculoskeletal: Positive for neck pain.  All other systems reviewed and are negative.    Physical Exam Updated Vital Signs BP (!) 133/48   Pulse 71   Temp 98 F (36.7 C) (Oral)   Resp 18   SpO2 100%   Physical Exam  Constitutional: She is oriented to person, place, and time. She appears well-developed and well-nourished.  HENT:  Head:    Right Ear: External ear normal.  Left Ear: External ear normal.  Nose: Epistaxis is observed.  Mouth/Throat: Oropharynx is clear and moist.  Eyes: Pupils are equal, round, and reactive to light. Conjunctivae and EOM are normal.  Neck:  Spinous process tenderness and muscular tenderness present.  Cardiovascular: Normal rate, regular rhythm, normal heart sounds and intact distal pulses.  Pulmonary/Chest: Effort normal and breath sounds normal.  Abdominal: Soft. Bowel sounds are normal.  Musculoskeletal: Normal range of motion.  Neurological: She is alert and oriented to person, place, and time.  Skin: Skin is warm. Capillary refill takes less than 2 seconds.  Psychiatric: She has a normal mood and affect. Her behavior is normal. Judgment and thought content normal.  Nursing note and vitals reviewed.    ED Treatments / Results  Labs (all labs ordered are listed, but only abnormal results are displayed) Labs Reviewed  BASIC METABOLIC PANEL - Abnormal; Notable for the following components:      Result Value   Glucose, Bld 117 (*)    BUN 36 (*)    Creatinine, Ser 1.44 (*)    Calcium 8.7 (*)    GFR calc non Af Amer 33 (*)    GFR calc Af Amer 38 (*)    All other components within normal limits  CBC WITH DIFFERENTIAL/PLATELET - Abnormal; Notable for the following components:   WBC 10.9 (*)    RBC 3.22 (*)    Hemoglobin 8.9 (*)    HCT 29.3 (*)    RDW 17.5 (*)    Neutro Abs 7.8 (*)    Monocytes Absolute 1.3 (*)    All other components within normal limits  PROTIME-INR    EKG None  Radiology Ct Head Wo Contrast  Result Date: 09/18/2017 CLINICAL DATA:  Fall this morning, hematoma to forehead, bleeding from nose. EXAM: CT HEAD WITHOUT CONTRAST CT MAXILLOFACIAL WITHOUT CONTRAST CT CERVICAL SPINE WITHOUT CONTRAST TECHNIQUE: Multidetector CT imaging of the head, cervical spine, and maxillofacial structures were performed using  the standard protocol without intravenous contrast. Multiplanar CT image reconstructions of the cervical spine and maxillofacial structures were also generated. COMPARISON:  Brain MRI dated 05/02/2017. FINDINGS: CT HEAD FINDINGS Brain: Small acute subdural hemorrhage located just to the LEFT of the  interhemispheric fissure, at the upper margin of the anterior falx, measuring 7 mm thickness, without significant mass effect. No additional extra-axial hemorrhage. No parenchymal hemorrhage or evidence of parenchymal edema. Chronic small vessel ischemic changes are seen throughout the bilateral periventricular and subcortical white matter regions. 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 No hydrocephalus. Vascular: There are chronic calcified atherosclerotic changes of the large vessels at the skull base. No unexpected hyperdense vessel. Skull: No skull fracture or displacement. Other: Large soft tissue hematoma overlying the lower LEFT frontal bone. No underlying fracture. Slightly displaced fracture at the anterior aspects of the LEFT nasal bone. CT MAXILLOFACIAL FINDINGS Osseous: Lower frontal bones are intact and normally aligned. Osseous structures about the orbits are intact and normally aligned bilaterally. Bilateral zygomatic arches and pterygoid plates are intact. No mandible fracture or displacement seen. A focal lucency within the anterior RIGHT mandible appears to have well corticated margins indicating vessel foramen, not appreciably changed compared to the earlier facial bone CT of 12/23/2008. Incidental note made of chronic degenerative change at the RIGHT TMJ with associated mild subluxation of the mandibular condyle. Slightly displaced fracture at the anterior aspects of the LEFT nasal bone. Questionable minimally displaced fracture at the upper margin of the nasal septum. Chronic rightward deviation of the nasal septum. Orbits: Orbital globes appear intact and symmetric in configuration. No retro-orbital hemorrhage or edema. Sinuses: Fluid levels within the bilateral maxillary sinuses, sphenoid sinus and ethmoid air cells Soft tissues: Prominent soft tissue edema/hematoma overlying the LEFT lower frontal bone, medial LEFT orbit and nasal bones. CT  CERVICAL SPINE FINDINGS Alignment: No evidence of acute vertebral body subluxation. Skull base and vertebrae: No fracture line or displaced fracture fragment. Facet joints appear intact and normally aligned throughout. Anterior cervical fusion hardware appears intact and appropriately positioned at the C5 through C7 levels. Soft tissues and spinal canal: No prevertebral fluid or swelling. No visible canal hematoma. Disc levels:  No significant central canal stenosis at any level. Upper chest: No acute findings. Other: Bilateral carotid atherosclerosis. IMPRESSION: 1. Small acute subdural hemorrhage along the anterior aspects of the interhemispheric fissure, measuring 7 mm greatest thickness, without significant mass effect. No other intracranial hemorrhage. No skull fracture. 2. 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 Additional chronic small vessel ischemic changes throughout the white matter regions bilaterally. 3. Large soft tissue hematoma overlying the LEFT lower frontal bone. No underlying fracture. 4. Slightly displaced fracture of the LEFT nasal bone, anterior aspect. Questionable minimally displaced fracture of the underlying anterior nasal septum. Associated fluid levels within the paranasal sinuses. 5. No other facial bone fracture or displacement seen. 6. No fracture or acute subluxation within the cervical spine. Anterior cervical fusion hardware within the lower cervical spine appears intact and appropriately positioned. These results were called by telephone at the time of interpretation on 09/18/2017 at 11:07 am to Dr. Isla Pence , who verbally acknowledged these results. Electronically Signed   By: Franki Cabot M.D.   On: 09/18/2017 11:09   Ct Cervical Spine Wo Contrast  Result Date: 09/18/2017 CLINICAL DATA:  Fall this morning, hematoma to forehead, bleeding from nose. EXAM: CT HEAD WITHOUT CONTRAST CT MAXILLOFACIAL WITHOUT CONTRAST CT CERVICAL  SPINE  WITHOUT CONTRAST TECHNIQUE: Multidetector CT imaging of the head, cervical spine, and maxillofacial structures were performed using the standard protocol without intravenous contrast. Multiplanar CT image reconstructions of the cervical spine and maxillofacial structures were also generated. COMPARISON:  Brain MRI dated 05/02/2017. FINDINGS: CT HEAD FINDINGS Brain: Small acute subdural hemorrhage located just to the LEFT of the interhemispheric fissure, at the upper margin of the anterior falx, measuring 7 mm thickness, without significant mass effect. No additional extra-axial hemorrhage. No parenchymal hemorrhage or evidence of parenchymal edema. Chronic small vessel ischemic changes are seen throughout the bilateral periventricular and subcortical white matter regions. 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 No hydrocephalus. Vascular: There are chronic calcified atherosclerotic changes of the large vessels at the skull base. No unexpected hyperdense vessel. Skull: No skull fracture or displacement. Other: Large soft tissue hematoma overlying the lower LEFT frontal bone. No underlying fracture. Slightly displaced fracture at the anterior aspects of the LEFT nasal bone. CT MAXILLOFACIAL FINDINGS Osseous: Lower frontal bones are intact and normally aligned. Osseous structures about the orbits are intact and normally aligned bilaterally. Bilateral zygomatic arches and pterygoid plates are intact. No mandible fracture or displacement seen. A focal lucency within the anterior RIGHT mandible appears to have well corticated margins indicating vessel foramen, not appreciably changed compared to the earlier facial bone CT of 12/23/2008. Incidental note made of chronic degenerative change at the RIGHT TMJ with associated mild subluxation of the mandibular condyle. Slightly displaced fracture at the anterior aspects of the LEFT nasal bone. Questionable minimally displaced  fracture at the upper margin of the nasal septum. Chronic rightward deviation of the nasal septum. Orbits: Orbital globes appear intact and symmetric in configuration. No retro-orbital hemorrhage or edema. Sinuses: Fluid levels within the bilateral maxillary sinuses, sphenoid sinus and ethmoid air cells Soft tissues: Prominent soft tissue edema/hematoma overlying the LEFT lower frontal bone, medial LEFT orbit and nasal bones. CT CERVICAL SPINE FINDINGS Alignment: No evidence of acute vertebral body subluxation. Skull base and vertebrae: No fracture line or displaced fracture fragment. Facet joints appear intact and normally aligned throughout. Anterior cervical fusion hardware appears intact and appropriately positioned at the C5 through C7 levels. Soft tissues and spinal canal: No prevertebral fluid or swelling. No visible canal hematoma. Disc levels:  No significant central canal stenosis at any level. Upper chest: No acute findings. Other: Bilateral carotid atherosclerosis. IMPRESSION: 1. Small acute subdural hemorrhage along the anterior aspects of the interhemispheric fissure, measuring 7 mm greatest thickness, without significant mass effect. No other intracranial hemorrhage. No skull fracture. 2. 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 Additional chronic small vessel ischemic changes throughout the white matter regions bilaterally. 3. Large soft tissue hematoma overlying the LEFT lower frontal bone. No underlying fracture. 4. Slightly displaced fracture of the LEFT nasal bone, anterior aspect. Questionable minimally displaced fracture of the underlying anterior nasal septum. Associated fluid levels within the paranasal sinuses. 5. No other facial bone fracture or displacement seen. 6. No fracture or acute subluxation within the cervical spine. Anterior cervical fusion hardware within the lower cervical spine appears intact and appropriately positioned. These results were  called by telephone at the time of interpretation on 09/18/2017 at 11:07 am to Dr. Isla Pence , who verbally acknowledged these results. Electronically Signed   By: Franki Cabot M.D.   On: 09/18/2017 11:09   Ct Maxillofacial Wo Contrast  Result Date: 09/18/2017 CLINICAL DATA:  Fall this  morning, hematoma to forehead, bleeding from nose. EXAM: CT HEAD WITHOUT CONTRAST CT MAXILLOFACIAL WITHOUT CONTRAST CT CERVICAL SPINE WITHOUT CONTRAST TECHNIQUE: Multidetector CT imaging of the head, cervical spine, and maxillofacial structures were performed using the standard protocol without intravenous contrast. Multiplanar CT image reconstructions of the cervical spine and maxillofacial structures were also generated. COMPARISON:  Brain MRI dated 05/02/2017. FINDINGS: CT HEAD FINDINGS Brain: Small acute subdural hemorrhage located just to the LEFT of the interhemispheric fissure, at the upper margin of the anterior falx, measuring 7 mm thickness, without significant mass effect. No additional extra-axial hemorrhage. No parenchymal hemorrhage or evidence of parenchymal edema. Chronic small vessel ischemic changes are seen throughout the bilateral periventricular and subcortical white matter regions. 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 No hydrocephalus. Vascular: There are chronic calcified atherosclerotic changes of the large vessels at the skull base. No unexpected hyperdense vessel. Skull: No skull fracture or displacement. Other: Large soft tissue hematoma overlying the lower LEFT frontal bone. No underlying fracture. Slightly displaced fracture at the anterior aspects of the LEFT nasal bone. CT MAXILLOFACIAL FINDINGS Osseous: Lower frontal bones are intact and normally aligned. Osseous structures about the orbits are intact and normally aligned bilaterally. Bilateral zygomatic arches and pterygoid plates are intact. No mandible fracture or displacement seen. A focal  lucency within the anterior RIGHT mandible appears to have well corticated margins indicating vessel foramen, not appreciably changed compared to the earlier facial bone CT of 12/23/2008. Incidental note made of chronic degenerative change at the RIGHT TMJ with associated mild subluxation of the mandibular condyle. Slightly displaced fracture at the anterior aspects of the LEFT nasal bone. Questionable minimally displaced fracture at the upper margin of the nasal septum. Chronic rightward deviation of the nasal septum. Orbits: Orbital globes appear intact and symmetric in configuration. No retro-orbital hemorrhage or edema. Sinuses: Fluid levels within the bilateral maxillary sinuses, sphenoid sinus and ethmoid air cells Soft tissues: Prominent soft tissue edema/hematoma overlying the LEFT lower frontal bone, medial LEFT orbit and nasal bones. CT CERVICAL SPINE FINDINGS Alignment: No evidence of acute vertebral body subluxation. Skull base and vertebrae: No fracture line or displaced fracture fragment. Facet joints appear intact and normally aligned throughout. Anterior cervical fusion hardware appears intact and appropriately positioned at the C5 through C7 levels. Soft tissues and spinal canal: No prevertebral fluid or swelling. No visible canal hematoma. Disc levels:  No significant central canal stenosis at any level. Upper chest: No acute findings. Other: Bilateral carotid atherosclerosis. IMPRESSION: 1. Small acute subdural hemorrhage along the anterior aspects of the interhemispheric fissure, measuring 7 mm greatest thickness, without significant mass effect. No other intracranial hemorrhage. No skull fracture. 2. 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 Additional chronic small vessel ischemic changes throughout the white matter regions bilaterally. 3. Large soft tissue hematoma overlying the LEFT lower frontal bone. No underlying fracture. 4. Slightly displaced fracture  of the LEFT nasal bone, anterior aspect. Questionable minimally displaced fracture of the underlying anterior nasal septum. Associated fluid levels within the paranasal sinuses. 5. No other facial bone fracture or displacement seen. 6. No fracture or acute subluxation within the cervical spine. Anterior cervical fusion hardware within the lower cervical spine appears intact and appropriately positioned. These results were called by telephone at the time of interpretation on 09/18/2017 at 11:07 am to Dr. Isla Pence , who verbally acknowledged these results. Electronically Signed   By: Franki Cabot M.D.   On:  09/18/2017 11:09    Procedures .Epistaxis Management Date/Time: 09/18/2017 1:29 PM Performed by: Isla Pence, MD Authorized by: Isla Pence, MD   Consent:    Consent obtained:  Verbal   Consent given by:  Patient   Risks discussed:  Pain and bleeding   Alternatives discussed:  No treatment Anesthesia (see MAR for exact dosages):    Anesthesia method:  None Procedure details:    Treatment site:  R anterior   Treatment method:  Silver nitrate   Treatment complexity:  Limited   Treatment episode: initial   Post-procedure details:    Assessment:  No improvement   Patient tolerance of procedure:  Tolerated well, no immediate complications .Epistaxis Management Date/Time: 09/18/2017 1:29 PM Performed by: Isla Pence, MD Authorized by: Isla Pence, MD   Consent:    Consent obtained:  Verbal   Consent given by:  Patient   Risks discussed:  Bleeding, infection, nasal injury and pain   Alternatives discussed:  No treatment Anesthesia (see MAR for exact dosages):    Anesthesia method:  None Procedure details:    Treatment site:  L anterior   Treatment method:  Nasal balloon   Treatment complexity:  Limited   Treatment episode: recurring   Post-procedure details:    Assessment:  Bleeding decreased   Patient tolerance of procedure:  Tolerated well, no immediate  complications   (including critical care time)  Medications Ordered in ED Medications  silver nitrate applicators applicator 1 Stick (has no administration in time range)  ondansetron (ZOFRAN) injection 4 mg (has no administration in time range)  morphine 4 MG/ML injection 4 mg (4 mg Intramuscular Not Given 09/18/17 1327)  0.9 %  sodium chloride infusion (has no administration in time range)  oxymetazoline (AFRIN) 0.05 % nasal spray 1 spray (1 spray Each Nare Given 09/18/17 0956)  oxyCODONE-acetaminophen (PERCOCET/ROXICET) 5-325 MG per tablet 1 tablet (1 tablet Oral Given 09/18/17 1149)     Initial Impression / Assessment and Plan / ED Course  I have reviewed the triage vital signs and the nursing notes.  Pertinent labs & imaging results that were available during my care of the patient were reviewed by me and considered in my medical decision making (see chart for details).    Pt d/w Dr. Sherwood Gambler (NS) who came to see pt.  He recommends no Plavix/ASA for 2 weeks and repeat CT scan.  Nasal packing with balloon has helped the bleeding, but it has not stopped it completely.  Pt d/w Dr. Erik Obey (ENT) who will see pt.  Pt d/w Dr. Maudie Mercury ([facility]) for admission.  Final Clinical Impressions(s) / ED Diagnoses   Final diagnoses:  Subdural hematoma (HCC)  Closed fracture of nasal bone, initial encounter  Epistaxis  On clopidogrel therapy    ED Discharge Orders    None       Isla Pence, MD 09/18/17 1334

## 2017-09-18 NOTE — ED Triage Notes (Signed)
PT refuses Morphine because Pt reports Morphine will make you crazy.

## 2017-09-19 ENCOUNTER — Telehealth: Payer: Self-pay | Admitting: Cardiology

## 2017-09-19 LAB — COMPREHENSIVE METABOLIC PANEL
ALT: 40 U/L (ref 14–54)
AST: 50 U/L — ABNORMAL HIGH (ref 15–41)
Albumin: 3 g/dL — ABNORMAL LOW (ref 3.5–5.0)
Alkaline Phosphatase: 89 U/L (ref 38–126)
Anion gap: 9 (ref 5–15)
BUN: 41 mg/dL — ABNORMAL HIGH (ref 6–20)
CO2: 22 mmol/L (ref 22–32)
Calcium: 8.5 mg/dL — ABNORMAL LOW (ref 8.9–10.3)
Chloride: 110 mmol/L (ref 101–111)
Creatinine, Ser: 1.48 mg/dL — ABNORMAL HIGH (ref 0.44–1.00)
GFR calc Af Amer: 37 mL/min — ABNORMAL LOW (ref >60)
GFR calc non Af Amer: 32 mL/min — ABNORMAL LOW (ref >60)
Glucose, Bld: 118 mg/dL — ABNORMAL HIGH (ref 65–99)
Potassium: 4.3 mmol/L (ref 3.5–5.1)
Sodium: 141 mmol/L (ref 135–145)
Total Bilirubin: 1 mg/dL (ref 0.3–1.2)
Total Protein: 5.3 g/dL — ABNORMAL LOW (ref 6.5–8.1)

## 2017-09-19 LAB — CBC
HCT: 22.9 % — ABNORMAL LOW (ref 36.0–46.0)
Hemoglobin: 7.1 g/dL — ABNORMAL LOW (ref 12.0–15.0)
MCH: 28.3 pg (ref 26.0–34.0)
MCHC: 31 g/dL (ref 30.0–36.0)
MCV: 91.2 fL (ref 78.0–100.0)
Platelets: 161 10*3/uL (ref 150–400)
RBC: 2.51 MIL/uL — ABNORMAL LOW (ref 3.87–5.11)
RDW: 17.9 % — ABNORMAL HIGH (ref 11.5–15.5)
WBC: 7 10*3/uL (ref 4.0–10.5)

## 2017-09-19 LAB — ABO/RH: ABO/RH(D): A NEG

## 2017-09-19 LAB — PREPARE RBC (CROSSMATCH)

## 2017-09-19 MED ORDER — SODIUM CHLORIDE 0.9 % IV SOLN
Freq: Once | INTRAVENOUS | Status: AC
  Administered 2017-09-19: 15:00:00 via INTRAVENOUS

## 2017-09-19 NOTE — Consult Note (Signed)
   [facility] Inpatient Consult   09/19/2017  Valerie Ramirez 09/30/1932 [id number]    Patient screened for potential [facility] Care Management services. Patient also qualifies for [facility] if appropriate.  Discussed above with (covering) inpatient RNCM (Sam).   Marthenia Rolling, MSN-Ed, RN,BSN [facility] Liaison [phone]

## 2017-09-19 NOTE — Progress Notes (Signed)
Attending MD note  Patient was seen, examined,treatment plan was discussed with the PA-S.  I have personally reviewed the clinical findings, lab, imaging studies and management of this patient in detail. I agree with the documentation, as recorded by the PA-S  Patient is a pleasant 84 year old female with history of hypothyroidism, hypertension, hyperlipidemia, coronary artery disease who had a ground-level fall in her kitchen and fell face first onto the hard floor.  She came to the hospital with facial trauma, was found to have subdural hematoma and a nasal bone fracture with significant epistaxis.  Neurosurgery and ENT were consulted in the ED.  BP (!) 133/44 (BP Location: Left Arm)   Pulse 69   Temp 98.3 F (36.8 C) (Oral)   Resp 20   Ht 5\' 2"  (1.575 m)   Wt 97.1 kg (214 lb 1.1 oz)   SpO2 93%   BMI 39.15 kg/m  On Exam: Gen. exam: Awake, alert, not in any distress, significant ecchymosis around her eyes at the base of her nose Chest: Good air entry bilaterally, no rhonchi or rales CVS: S1-S2 regular, no murmurs Abdomen: Soft, nontender and nondistended Neurology: Non-focal Skin: No rash or lesions  Plan  Subdural hematoma -Hold aspirin and Plavix for at least couple weeks,  -neurosurgery following, recommended repeat CT scan on 6/4 in the morning  Deconditioning/recurrent falls -PT evaluation pending  Epistaxis/nasal fracture -ENT consulted, status post balloon pack on the right  Coronary artery disease -Continue home medications, admitting MD discussed with on-call cardiologist Dr. Aundra Dubin, okay to hold DAPT for now  Chronic kidney disease stage III -Creatinine at baseline  Chronic anemia due to kidney disease, also component of acute blood loss anemia in the setting of epistaxis -Hemoglobin close to 7 this morning, given CAD will keep above 8, transfuse 1 unit of packed red blood cells  Rest as below  Skie Vitrano M. Cruzita Lederer, MD [facility] [phone]  If  7PM-7AM, please contact night-coverage [email or web address] Password [email or web address]    PROGRESS NOTE  CATHARINE KETTLEWELL WSF:[id number] DOB: [birth year] DOA: 09/18/2017 PCP: Abner Greenspan, MD   LOS: 1 day   Brief Narrative / Interim history: Pt is a 84 y.o. Female with history of hypothyroidism, hypertension, hyperlipidemia, CAD who was admitted to the hospital for epistaxis and facial trauma from stubbing her toe and falling head first. CBC shows acute anemia. CT showed evidence of small left falx acute subdural hematoma and a nasal bone fracture. Repeat CT scheduled for tomorrow (6/4) morning.   Assessment & Plan: Principal Problem:   Subdural hematoma (HCC) Active Problems:   Essential hypertension   CKD (chronic kidney disease), stage III (HCC)   CAD (coronary artery disease)   Subdural hematoma -Aspirin and Plavix discontinued temporarily -Neurosurgery reports no surgical intervention at this time and monitor with serial neuro checks, neurosurgery for input on when to restart antiplatelets  -Follow up CT scheduled 6/4, if stable patient can return home  Epistaxis and nasal fracture -nasal balloon in place to decrease bleeding -Ice, elevation, nasal hygiene, Keflex -ENT to remove nasal packs in office Wed or Thurs (6/5 or 6/6)  -Vestibular rehabilitation evaluation recommended by ENT as patient has multiple falls   Hypertension/CAD -Continue to monitor BP -Continue Carvedilol 25mg  BID, Losartan 25mg  BID, Amlodipine 92md qd and Rosuvastatin 10mg  qd  CKD -Creatinine at 1.48, around patient's baseline for past year  Anemia -CBC shows Hgb down to 7.1 this morning from 8.9 last night, most likely from blood loss via epistaxis. Will transfuse  1 unit.  -Repeat CBC 6/4  DVT prophylaxis: SCDs Code Status: Full Code Family Communication: [name] Disposition Plan: Home when improved  Consultants:   Neurosurgery  ENT  PT  Procedures:   Antimicrobials:  Keflex 250mg   QID  Subjective: Patient reports generalized weakness and fatigue along with facial pain, but is improving. She does not have headache, confusion, nausea, or abdominal pain.  Objective: Vitals:   09/18/17 2042 09/19/17 0013 09/19/17 0337 09/19/17 0735  BP: (!) 115/46 (!) 151/46 (!) 145/48 (!) 142/58  Pulse: 76 65 69 73  Resp: 18 18 16 16   Temp: 97.6 F (36.4 C) 98.4 F (36.9 C) 98.1 F (36.7 C) 98.4 F (36.9 C)  TempSrc: Oral Oral Oral Oral  SpO2: 98% 91% 95% 91%  Weight:      Height:        Intake/Output Summary (Last 24 hours) at 09/19/2017 1030 Last data filed at 09/19/2017 0600 Gross per 24 hour  Intake 272.08 ml  Output 1 ml  Net 271.08 ml   Filed Weights   09/18/17 1552  Weight: 97.1 kg (214 lb 1.1 oz)    Examination:  Constitutional: NAD Eyes: PERRL, contusions in bilateral periorbital regions and forehead ENT: nasal balloon in place Respiratory: clear to auscultation bilaterally, no wheezing, no crackles. Normal respiratory effort. No accessory muscle use.  Cardiovascular: Regular rate and rhythm, no murmurs / rubs / gallops. No LE edema. 2+ pedal pulses.  Abdomen: no tenderness. Bowel sounds positive.  Musculoskeletal: no clubbing / cyanosis. No joint deformity upper and lower extremities. No contractures. Normal muscle tone.  Skin: no rashes, lesions, ulcers. No induration Neurologic: CN 2-12 grossly intact. Strength 5/5 in all 4.  Psychiatric: Normal judgment and insight. Alert and oriented x 3. Normal mood.    Data Reviewed: I have independently reviewed following labs and imaging studies  CBC: Recent Labs  Lab 09/18/17 1135 09/19/17 0502  WBC 10.9* 7.0  NEUTROABS 7.8*  --   HGB 8.9* 7.1*  HCT 29.3* 22.9*  MCV 91.0 91.2  PLT 200 376   Basic Metabolic Panel: Recent Labs  Lab 09/18/17 1135 09/19/17 0502  NA 138 141  K 4.2 4.3  CL 108 110  CO2 22 22  GLUCOSE 117* 118*  BUN 36* 41*  CREATININE 1.44* 1.48*  CALCIUM 8.7* 8.5*    GFR: Estimated Creatinine Clearance: 31.3 mL/min (A) (by C-G formula based on SCr of 1.48 mg/dL (H)). Liver Function Tests: Recent Labs  Lab 09/19/17 0502  AST 50*  ALT 40  ALKPHOS 89  BILITOT 1.0  PROT 5.3*  ALBUMIN 3.0*   No results for input(s): LIPASE, AMYLASE in the last 168 hours. No results for input(s): AMMONIA in the last 168 hours. Coagulation Profile: Recent Labs  Lab 09/18/17 1135  INR 1.06   Cardiac Enzymes: No results for input(s): CKTOTAL, CKMB, CKMBINDEX, TROPONINI in the last 168 hours. BNP (last 3 results) No results for input(s): PROBNP in the last 8760 hours. HbA1C: No results for input(s): HGBA1C in the last 72 hours. CBG: No results for input(s): GLUCAP in the last 168 hours. Lipid Profile: No results for input(s): CHOL, HDL, LDLCALC, TRIG, CHOLHDL, LDLDIRECT in the last 72 hours. Thyroid Function Tests: No results for input(s): TSH, T4TOTAL, FREET4, T3FREE, THYROIDAB in the last 72 hours. Anemia Panel: No results for input(s): VITAMINB12, FOLATE, FERRITIN, TIBC, IRON, RETICCTPCT in the last 72 hours. Urine analysis:    Component Value Date/Time   COLORURINE AMBER (A) 04/25/2016 1704  APPEARANCEUR HAZY (A) 04/25/2016 1704   LABSPEC 1.025 04/25/2016 1704   PHURINE 5.0 04/25/2016 1704   GLUCOSEU NEGATIVE 04/25/2016 1704   HGBUR NEGATIVE 04/25/2016 1704   BILIRUBINUR Negative 09/15/2016 1214   KETONESUR NEGATIVE 04/25/2016 1704   PROTEINUR 15 mg/dL 09/15/2016 1214   PROTEINUR 100 (A) 04/25/2016 1704   UROBILINOGEN 0.2 09/15/2016 1214   UROBILINOGEN 1.0 06/20/2014 1030   NITRITE Negative 09/15/2016 1214   NITRITE NEGATIVE 04/25/2016 1704   LEUKOCYTESUR Negative 09/15/2016 1214   Sepsis Labs: Invalid input(s): PROCALCITONIN, LACTICIDVEN  No results found for this or any previous visit (from the past 240 hour(s)).    Radiology Studies: Ct Head Wo Contrast  Result Date: 09/18/2017 CLINICAL DATA:  Fall this morning, hematoma to  forehead, bleeding from nose. EXAM: CT HEAD WITHOUT CONTRAST CT MAXILLOFACIAL WITHOUT CONTRAST CT CERVICAL SPINE WITHOUT CONTRAST TECHNIQUE: Multidetector CT imaging of the head, cervical spine, and maxillofacial structures were performed using the standard protocol without intravenous contrast. Multiplanar CT image reconstructions of the cervical spine and maxillofacial structures were also generated. COMPARISON:  Brain MRI dated 05/02/2017. FINDINGS: CT HEAD FINDINGS Brain: Small acute subdural hemorrhage located just to the LEFT of the interhemispheric fissure, at the upper margin of the anterior falx, measuring 7 mm thickness, without significant mass effect. No additional extra-axial hemorrhage. No parenchymal hemorrhage or evidence of parenchymal edema. Chronic small vessel ischemic changes are seen throughout the bilateral periventricular and subcortical white matter regions. 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 No hydrocephalus. Vascular: There are chronic calcified atherosclerotic changes of the large vessels at the skull base. No unexpected hyperdense vessel. Skull: No skull fracture or displacement. Other: Large soft tissue hematoma overlying the lower LEFT frontal bone. No underlying fracture. Slightly displaced fracture at the anterior aspects of the LEFT nasal bone. CT MAXILLOFACIAL FINDINGS Osseous: Lower frontal bones are intact and normally aligned. Osseous structures about the orbits are intact and normally aligned bilaterally. Bilateral zygomatic arches and pterygoid plates are intact. No mandible fracture or displacement seen. A focal lucency within the anterior RIGHT mandible appears to have well corticated margins indicating vessel foramen, not appreciably changed compared to the earlier facial bone CT of 12/23/2008. Incidental note made of chronic degenerative change at the RIGHT TMJ with associated mild subluxation of the mandibular  condyle. Slightly displaced fracture at the anterior aspects of the LEFT nasal bone. Questionable minimally displaced fracture at the upper margin of the nasal septum. Chronic rightward deviation of the nasal septum. Orbits: Orbital globes appear intact and symmetric in configuration. No retro-orbital hemorrhage or edema. Sinuses: Fluid levels within the bilateral maxillary sinuses, sphenoid sinus and ethmoid air cells Soft tissues: Prominent soft tissue edema/hematoma overlying the LEFT lower frontal bone, medial LEFT orbit and nasal bones. CT CERVICAL SPINE FINDINGS Alignment: No evidence of acute vertebral body subluxation. Skull base and vertebrae: No fracture line or displaced fracture fragment. Facet joints appear intact and normally aligned throughout. Anterior cervical fusion hardware appears intact and appropriately positioned at the C5 through C7 levels. Soft tissues and spinal canal: No prevertebral fluid or swelling. No visible canal hematoma. Disc levels:  No significant central canal stenosis at any level. Upper chest: No acute findings. Other: Bilateral carotid atherosclerosis. IMPRESSION: 1. Small acute subdural hemorrhage along the anterior aspects of the interhemispheric fissure, measuring 7 mm greatest thickness, without significant mass effect. No other intracranial hemorrhage. No skull fracture. 2. 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 Additional chronic small vessel ischemic changes throughout the white matter regions bilaterally. 3. Large soft tissue hematoma overlying the LEFT lower frontal bone. No underlying fracture. 4. Slightly displaced fracture of the LEFT nasal bone, anterior aspect. Questionable minimally displaced fracture of the underlying anterior nasal septum. Associated fluid levels within the paranasal sinuses. 5. No other facial bone fracture or displacement seen. 6. No fracture or acute subluxation within the cervical spine. Anterior  cervical fusion hardware within the lower cervical spine appears intact and appropriately positioned. These results were called by telephone at the time of interpretation on 09/18/2017 at 11:07 am to Dr. Isla Pence , who verbally acknowledged these results. Electronically Signed   By: Franki Cabot M.D.   On: 09/18/2017 11:09   Ct Cervical Spine Wo Contrast  Result Date: 09/18/2017 CLINICAL DATA:  Fall this morning, hematoma to forehead, bleeding from nose. EXAM: CT HEAD WITHOUT CONTRAST CT MAXILLOFACIAL WITHOUT CONTRAST CT CERVICAL SPINE WITHOUT CONTRAST TECHNIQUE: Multidetector CT imaging of the head, cervical spine, and maxillofacial structures were performed using the standard protocol without intravenous contrast. Multiplanar CT image reconstructions of the cervical spine and maxillofacial structures were also generated. COMPARISON:  Brain MRI dated 05/02/2017. FINDINGS: CT HEAD FINDINGS Brain: Small acute subdural hemorrhage located just to the LEFT of the interhemispheric fissure, at the upper margin of the anterior falx, measuring 7 mm thickness, without significant mass effect. No additional extra-axial hemorrhage. No parenchymal hemorrhage or evidence of parenchymal edema. Chronic small vessel ischemic changes are seen throughout the bilateral periventricular and subcortical white matter regions. 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 No hydrocephalus. Vascular: There are chronic calcified atherosclerotic changes of the large vessels at the skull base. No unexpected hyperdense vessel. Skull: No skull fracture or displacement. Other: Large soft tissue hematoma overlying the lower LEFT frontal bone. No underlying fracture. Slightly displaced fracture at the anterior aspects of the LEFT nasal bone. CT MAXILLOFACIAL FINDINGS Osseous: Lower frontal bones are intact and normally aligned. Osseous structures about the orbits are intact and normally aligned  bilaterally. Bilateral zygomatic arches and pterygoid plates are intact. No mandible fracture or displacement seen. A focal lucency within the anterior RIGHT mandible appears to have well corticated margins indicating vessel foramen, not appreciably changed compared to the earlier facial bone CT of 12/23/2008. Incidental note made of chronic degenerative change at the RIGHT TMJ with associated mild subluxation of the mandibular condyle. Slightly displaced fracture at the anterior aspects of the LEFT nasal bone. Questionable minimally displaced fracture at the upper margin of the nasal septum. Chronic rightward deviation of the nasal septum. Orbits: Orbital globes appear intact and symmetric in configuration. No retro-orbital hemorrhage or edema. Sinuses: Fluid levels within the bilateral maxillary sinuses, sphenoid sinus and ethmoid air cells Soft tissues: Prominent soft tissue edema/hematoma overlying the LEFT lower frontal bone, medial LEFT orbit and nasal bones. CT CERVICAL SPINE FINDINGS Alignment: No evidence of acute vertebral body subluxation. Skull base and vertebrae: No fracture line or displaced fracture fragment. Facet joints appear intact and normally aligned throughout. Anterior cervical fusion hardware appears intact and appropriately positioned at the C5 through C7 levels. Soft tissues and spinal canal: No prevertebral fluid or swelling. No visible canal hematoma. Disc levels:  No significant central canal stenosis at any level. Upper chest: No acute findings. Other: Bilateral carotid atherosclerosis. IMPRESSION: 1. Small acute subdural hemorrhage along the anterior aspects of the interhemispheric fissure, measuring 7 mm greatest thickness, without significant mass  effect. No other intracranial hemorrhage. No skull fracture. 2. 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 Additional chronic small vessel ischemic changes throughout the white matter regions bilaterally.  3. Large soft tissue hematoma overlying the LEFT lower frontal bone. No underlying fracture. 4. Slightly displaced fracture of the LEFT nasal bone, anterior aspect. Questionable minimally displaced fracture of the underlying anterior nasal septum. Associated fluid levels within the paranasal sinuses. 5. No other facial bone fracture or displacement seen. 6. No fracture or acute subluxation within the cervical spine. Anterior cervical fusion hardware within the lower cervical spine appears intact and appropriately positioned. These results were called by telephone at the time of interpretation on 09/18/2017 at 11:07 am to Dr. Isla Pence , who verbally acknowledged these results. Electronically Signed   By: Franki Cabot M.D.   On: 09/18/2017 11:09   Ct Maxillofacial Wo Contrast  Result Date: 09/18/2017 CLINICAL DATA:  Fall this morning, hematoma to forehead, bleeding from nose. EXAM: CT HEAD WITHOUT CONTRAST CT MAXILLOFACIAL WITHOUT CONTRAST CT CERVICAL SPINE WITHOUT CONTRAST TECHNIQUE: Multidetector CT imaging of the head, cervical spine, and maxillofacial structures were performed using the standard protocol without intravenous contrast. Multiplanar CT image reconstructions of the cervical spine and maxillofacial structures were also generated. COMPARISON:  Brain MRI dated 05/02/2017. FINDINGS: CT HEAD FINDINGS Brain: Small acute subdural hemorrhage located just to the LEFT of the interhemispheric fissure, at the upper margin of the anterior falx, measuring 7 mm thickness, without significant mass effect. No additional extra-axial hemorrhage. No parenchymal hemorrhage or evidence of parenchymal edema. Chronic small vessel ischemic changes are seen throughout the bilateral periventricular and subcortical white matter regions. 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 No hydrocephalus. Vascular: There are chronic calcified atherosclerotic changes of the large  vessels at the skull base. No unexpected hyperdense vessel. Skull: No skull fracture or displacement. Other: Large soft tissue hematoma overlying the lower LEFT frontal bone. No underlying fracture. Slightly displaced fracture at the anterior aspects of the LEFT nasal bone. CT MAXILLOFACIAL FINDINGS Osseous: Lower frontal bones are intact and normally aligned. Osseous structures about the orbits are intact and normally aligned bilaterally. Bilateral zygomatic arches and pterygoid plates are intact. No mandible fracture or displacement seen. A focal lucency within the anterior RIGHT mandible appears to have well corticated margins indicating vessel foramen, not appreciably changed compared to the earlier facial bone CT of 12/23/2008. Incidental note made of chronic degenerative change at the RIGHT TMJ with associated mild subluxation of the mandibular condyle. Slightly displaced fracture at the anterior aspects of the LEFT nasal bone. Questionable minimally displaced fracture at the upper margin of the nasal septum. Chronic rightward deviation of the nasal septum. Orbits: Orbital globes appear intact and symmetric in configuration. No retro-orbital hemorrhage or edema. Sinuses: Fluid levels within the bilateral maxillary sinuses, sphenoid sinus and ethmoid air cells Soft tissues: Prominent soft tissue edema/hematoma overlying the LEFT lower frontal bone, medial LEFT orbit and nasal bones. CT CERVICAL SPINE FINDINGS Alignment: No evidence of acute vertebral body subluxation. Skull base and vertebrae: No fracture line or displaced fracture fragment. Facet joints appear intact and normally aligned throughout. Anterior cervical fusion hardware appears intact and appropriately positioned at the C5 through C7 levels. Soft tissues and spinal canal: No prevertebral fluid or swelling. No visible canal hematoma. Disc levels:  No significant central canal stenosis at any level. Upper chest: No acute findings. Other: Bilateral  carotid atherosclerosis. IMPRESSION: 1. Small acute  subdural hemorrhage along the anterior aspects of the interhemispheric fissure, measuring 7 mm greatest thickness, without significant mass effect. No other intracranial hemorrhage. No skull fracture. 2. 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 Additional chronic small vessel ischemic changes throughout the white matter regions bilaterally. 3. Large soft tissue hematoma overlying the LEFT lower frontal bone. No underlying fracture. 4. Slightly displaced fracture of the LEFT nasal bone, anterior aspect. Questionable minimally displaced fracture of the underlying anterior nasal septum. Associated fluid levels within the paranasal sinuses. 5. No other facial bone fracture or displacement seen. 6. No fracture or acute subluxation within the cervical spine. Anterior cervical fusion hardware within the lower cervical spine appears intact and appropriately positioned. These results were called by telephone at the time of interpretation on 09/18/2017 at 11:07 am to Dr. Isla Pence , who verbally acknowledged these results. Electronically Signed   By: Franki Cabot M.D.   On: 09/18/2017 11:09     Scheduled Meds: . amLODipine  5 mg Oral Daily  . carvedilol  25 mg Oral BID WC  . cephALEXin  250 mg Oral Q6H  . colchicine  0.6 mg Oral BID  . DULoxetine  20 mg Oral Daily  . losartan  25 mg Oral Daily  .  morphine injection  4 mg Intramuscular Once  . rosuvastatin  10 mg Oral q1800  . sodium chloride flush  3 mL Intravenous Q12H   Continuous Infusions: . sodium chloride Stopped (09/18/17 1617)  . sodium chloride      Carita Pian, PA-S  Marzetta Board, MD, PhD [facility] Pager [phone]  If 7PM-7AM, please contact night-coverage [email or web address] Password [email or web address] 09/19/2017, 10:30 AM   [email or web address]

## 2017-09-19 NOTE — Progress Notes (Signed)
Daughter present to visit patient and patient states that she is talking to people that have come to visit her but the patient's daughter knows that these people have not been here. Daughter is concerned that the patient's status is deteriorating. Daughter has experienced a loss of a mother-in-law after a head injury in the recent past and has lost her father last year.  Reviewed CT results, plan for follow-up CT tomorrow, 6/4, and informed her of the intent to place progress note for MD review. Family satisfied with careplan. Wendee Copp

## 2017-09-19 NOTE — Evaluation (Signed)
Physical Therapy Evaluation Patient Details Name: Valerie Ramirez MRN: [id number] DOB: 16-Sep-1932 Today's Date: 09/19/2017   History of Present Illness  Patient is a pleasant 84 year old female with history of hypothyroidism, hypertension, hyperlipidemia, coronary artery disease who had a ground-level fall in her kitchen and fell face first onto the hard floor.  She came to the hospital with facial trauma, was found to have subdural hematoma and a nasal bone fracture with significant epistaxis.  PMH positive for HTN, cardiomyopathy, GERD,, endometrial CA.  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  Currently lives alone, but amenable to going home with 24 hour care with family and [place] [facility] [place].  PT to follow acutely.    Follow Up Recommendations Home health PT;Supervision/Assistance - 24 hour([facility])    Equipment Recommendations  None recommended by PT    Recommendations for Other Services       Precautions / Restrictions Precautions Precautions: Fall      Mobility  Bed Mobility Overal bed mobility: Needs Assistance Bed Mobility: Supine to Sit;Sit to Supine     Supine to sit: Min assist;HOB elevated Sit to supine: Min assist   General bed mobility comments: patient pulled up on PT to come up to sit on EOB; to supine assist for attempted modified hall pike, but ended up on her back  Transfers Overall transfer level: Needs assistance Equipment used: None Transfers: Sit to/from Stand Sit to Stand: Min assist         General transfer comment: able to stand unaided, but needed assist for balance in standing  Ambulation/Gait Ambulation/Gait assistance: Min guard;Min assist Ambulation Distance (Feet): 40 Feet Assistive device: IV Pole Gait Pattern/deviations: Step-through pattern;Decreased  stride length;Wide base of support     General Gait Details: off balance and needing assist for gait, refusing RW right now, but needed support of IV pole  Stairs            Wheelchair Mobility    Modified Rankin (Stroke Patients Only)       Balance Overall balance assessment: Needs assistance   Sitting balance-Leahy Scale: Good     Standing balance support: Single extremity supported Standing balance-Leahy Scale: Poor Standing balance comment: UE support needed for balance                             Pertinent Vitals/Pain Pain Assessment: 0-10 Pain Score: 3  Pain Location: back of head and neck Pain Descriptors / Indicators: Aching Pain Intervention(s): Monitored during session;Premedicated before session    Home Living Family/patient expects to be discharged to:: Private residence Living Arrangements: Alone   Type of Home: Mobile home Home Access: Stairs to enter Entrance Stairs-Rails: Right Entrance Stairs-Number of Steps: 3 Home Layout: One level Home Equipment: None;Walker - 4 wheels;Cane - single point;Shower seat - built in;Grab bars - tub/shower      Prior Function                 Hand Dominance   Dominant Hand: Right    Extremity/Trunk Assessment   Upper Extremity Assessment Upper Extremity Assessment: Generalized weakness    Lower Extremity Assessment Lower Extremity Assessment: Generalized weakness       Communication   Communication: No difficulties  Cognition Arousal/Alertness: Awake/alert Behavior During Therapy: WFL for tasks assessed/performed Overall Cognitive Status: Within  Functional Limits for tasks assessed                                 General Comments: reports hearing people talking to her that are not here      General Comments General comments (skin integrity, edema, etc.): bruising on face, under eyes, balloon in R nare and still some bloody drainage, recieving transfusion during  session and daughter and friend in room.   Vestibular Assessment - 09/19/17 1804      Vestibular Assessment   General Observation  Describes spinning sensation when first sitting up that just began after incident.  Lasts few seconds, no changes in hearing or vision.  Has fallen routinely for past several years, but continues to keep children and mow the grass, etc.  Gets up from falls on her own, or recently happened when son was close and got his help.       Symptom Behavior   Type of Dizziness  Spinning    Frequency of Dizziness  intermittent    Duration of Dizziness  seconds    Aggravating Factors  Supine to sit    Relieving Factors  Rest      Occulomotor Exam   Occulomotor Alignment  Abnormal periorbital swelling and ecchymosis    Spontaneous  Absent    Gaze-induced  Absent    Smooth Pursuits  Saccades    Saccades  Slow slower with vertical saccades      Vestibulo-Occular Reflex   VOR 1 Head Only (x 1 viewing)  intact horizontal and vertical, but limited due to cervical pain    VOR Cancellation  Normal      Positional Testing   Sidelying Test  -- attempted, too difficult to attain position (IV w/ PRBC's)        Exercises     Assessment/Plan    PT Assessment Patient needs continued PT services  PT Problem List Decreased balance;Decreased activity tolerance;Decreased knowledge of use of DME;Decreased mobility;Decreased strength;Decreased safety awareness;Decreased knowledge of precautions;Pain       PT Treatment Interventions DME instruction;Functional mobility training;Balance training;Patient/family education;Gait training;Therapeutic activities;Stair training;Therapeutic exercise    PT Goals (Current goals can be found in the Care Plan section)  Acute Rehab PT Goals Patient Stated Goal: To go home PT Goal Formulation: With patient/family Time For Goal Achievement: 10/03/17 Potential to Achieve Goals: Good    Frequency Min 5X/week   Barriers to discharge         Co-evaluation               AM-PAC PT "6 Clicks" Daily Activity  Outcome Measure Difficulty turning over in bed (including adjusting bedclothes, sheets and blankets)?: Unable Difficulty moving from lying on back to sitting on the side of the bed? : Unable Difficulty sitting down on and standing up from a chair with arms (e.g., wheelchair, bedside commode, etc,.)?: Unable Help needed moving to and from a bed to chair (including a wheelchair)?: A Little Help needed walking in hospital room?: A Little Help needed climbing 3-5 steps with a railing? : A Lot 6 Click Score: 11    End of Session Equipment Utilized During Treatment: Gait belt Activity Tolerance: Patient tolerated treatment well Patient left: in bed;with call bell/phone within reach;with family/visitor present;with bed alarm set   PT Visit Diagnosis: Other abnormalities of gait and mobility (R26.89);Dizziness and giddiness (R42);BPPV    Time: 9798-9211 PT Time Calculation (min) (ACUTE  ONLY): 43 min   Charges:   PT Evaluation $PT Eval Moderate Complexity: 1 Mod PT Treatments $Gait Training: 8-22 mins $Neuromuscular Re-education: 8-22 mins   PT G CodesMagda Kiel, [place] [phone] 09/19/2017   Reginia Naas 09/19/2017, 6:08 PM

## 2017-09-19 NOTE — Telephone Encounter (Signed)
New Message:      Pt's daughter is calling to let us know that her mom is in the hospital. She fell on yesterday and has a small brain bleed as well as a hematoma on her forehead. Dr. Durene Cal has taken her off of her plavix for 2 wks

## 2017-09-19 NOTE — Progress Notes (Signed)
Subjective: Patient sitting up in bed, without complaints.  Appetite has been poor, with limited p.o. intake.  Objective: Vital signs in last 24 hours: Vitals:   09/19/17 0337 09/19/17 0735 09/19/17 1208 09/19/17 1445  BP: (!) 145/48 (!) 142/58 (!) 133/44 (!) 119/43  Pulse: 69 73 69 70  Resp: 16 16 20 16   Temp: 98.1 F (36.7 C) 98.4 F (36.9 C) 98.3 F (36.8 C) 98.4 F (36.9 C)  TempSrc: Oral Oral Oral Oral  SpO2: 95% 91% 93% 96%  Weight:      Height:        Intake/Output from previous day: 06/02 0701 - 06/03 0700 In: 272.1 [P.O.:270; I.V.:2.1] Out: 1 [Urine:1] Intake/Output this shift: Total I/O In: 500 [I.V.:500] Out: -   Physical Exam: Awake and alert, oriented to name, hospital, and September 19, 2017.  Following commands.  Speech fluent.  Pupils equal, round, reactive to light.  EOMI.  Facial movement symmetrical.  Moving all 4 extremities well.  No drift of upper extremities.  CBC Recent Labs    09/18/17 1135 09/19/17 0502  WBC 10.9* 7.0  HGB 8.9* 7.1*  HCT 29.3* 22.9*  PLT 200 161   BMET Recent Labs    09/18/17 1135 09/19/17 0502  NA 138 141  K 4.2 4.3  CL 108 110  CO2 22 22  GLUCOSE 117* 118*  BUN 36* 41*  CREATININE 1.44* 1.48*  CALCIUM 8.7* 8.5*    Assessment/Plan: Stable from a neurosurgical perspective.  For follow-up CT of the brain without contrast at 0500 in a.m. Continues off of aspirin and Plavix.  Hosie Spangle, MD 09/19/2017, 6:15 PM

## 2017-09-19 NOTE — Telephone Encounter (Signed)
Message sent to Dr.Jordan. 

## 2017-09-20 ENCOUNTER — Inpatient Hospital Stay (HOSPITAL_COMMUNITY): Payer: Medicare Other

## 2017-09-20 DIAGNOSIS — S065X9A Traumatic subdural hemorrhage with loss of consciousness of unspecified duration, initial encounter: Principal | ICD-10-CM

## 2017-09-20 DIAGNOSIS — R040 Epistaxis: Secondary | ICD-10-CM

## 2017-09-20 DIAGNOSIS — I10 Essential (primary) hypertension: Secondary | ICD-10-CM

## 2017-09-20 DIAGNOSIS — S022XXA Fracture of nasal bones, initial encounter for closed fracture: Secondary | ICD-10-CM

## 2017-09-20 DIAGNOSIS — N183 Chronic kidney disease, stage 3 (moderate): Secondary | ICD-10-CM

## 2017-09-20 LAB — BPAM RBC
Blood Product Expiration Date: 201906192359
ISSUE DATE / TIME: 201906031441
Unit Type and Rh: 600

## 2017-09-20 LAB — TYPE AND SCREEN
ABO/RH(D): A NEG
Antibody Screen: NEGATIVE
Unit division: 0

## 2017-09-20 LAB — CBC
HCT: 25.2 % — ABNORMAL LOW (ref 36.0–46.0)
Hemoglobin: 7.9 g/dL — ABNORMAL LOW (ref 12.0–15.0)
MCH: 28 pg (ref 26.0–34.0)
MCHC: 31.3 g/dL (ref 30.0–36.0)
MCV: 89.4 fL (ref 78.0–100.0)
Platelets: 173 10*3/uL (ref 150–400)
RBC: 2.82 MIL/uL — ABNORMAL LOW (ref 3.87–5.11)
RDW: 17.7 % — ABNORMAL HIGH (ref 11.5–15.5)
WBC: 6.6 10*3/uL (ref 4.0–10.5)

## 2017-09-20 NOTE — Evaluation (Signed)
Occupational Therapy Evaluation Patient Details Name: Valerie Ramirez MRN: [id number] DOB: [birth year] Today's Date: 09/20/2017    History of Present Illness Patient is a pleasant 84 year old female with history of hypothyroidism, hypertension, hyperlipidemia, coronary artery disease who had a ground-level fall in her kitchen and fell face first onto the hard floor.  She came to the hospital with facial trauma, was found to have subdural hematoma and a nasal bone fracture with significant epistaxis.  PMH positive for HTN, cardiomyopathy, GERD,, endometrial CA.   Clinical Impression   This 84 y/o female presents with the above. Pt lives alone, reports at baseline she is independent with ADLs and functional mobility. Pt presents with decreased dynamic balance, generalized weakness. Pt completing room level functional mobility without AD and minA this session; completing toileting, LB dressing and standing grooming ADLs with MinA throughout task completion. Pt/pt's daughter present during session and reporting plans for SNF stay prior to return home. Feel this is appropriate recommendation given pt lives alone and given her current level of assist. Will continue to follow acutely to progress pt towards established OT goals.     Follow Up Recommendations  SNF;Supervision/Assistance - 24 hour    Equipment Recommendations  Other (comment)(TBD in next venue )           Precautions / Restrictions Precautions Precautions: Fall Restrictions Weight Bearing Restrictions: No      Mobility Bed Mobility Overal bed mobility: Needs Assistance Bed Mobility: Sit to Supine     Supine to sit: Min assist;HOB elevated Sit to supine: Min guard   General bed mobility comments: increased time and effort with use of bedrails, minguard for safety   Transfers Overall transfer level: Needs assistance Equipment used: 1 person hand held assist Transfers: Sit to/from Stand Sit to Stand: Min assist         General transfer comment: assist to rise and steady, stood from EOB and toilet     Balance Overall balance assessment: Needs assistance Sitting-balance support: Feet supported;No upper extremity supported Sitting balance-Leahy Scale: Good     Standing balance support: Single extremity supported Standing balance-Leahy Scale: Poor Standing balance comment: needs external support for balance.                            ADL either performed or assessed with clinical judgement   ADL Overall ADL's : Needs assistance/impaired Eating/Feeding: Supervision/ safety;Sitting   Grooming: Wash/dry hands;Minimal assistance;Standing   Upper Body Bathing: Sitting;Min guard   Lower Body Bathing: Minimal assistance;Sit to/from stand   Upper Body Dressing : Set up;Min guard;Sitting   Lower Body Dressing: Minimal assistance;Sit to/from stand Lower Body Dressing Details (indicate cue type and reason): pt able to don underwear/pants while sitting on toilet, min steadying assist provided in standing while pt advances over hips  Toilet Transfer: Minimal assistance;Ambulation;Regular Toilet;Grab bars   Toileting- Clothing Manipulation and Hygiene: Minimal assistance;Sit to/from stand Toileting - Clothing Manipulation Details (indicate cue type and reason): min steadying assist for clothing management, pt performing peri-care using lateral leans seated on toilet      Functional mobility during ADLs: Minimal assistance General ADL Comments: pt ambulated to bathroom, noted to have some incontinenct prior to reaching toilet, donned clean underwear and pants     Vision Baseline Vision/History: Wears glasses Wears Glasses: Reading only Patient Visual Report: No change from baseline Vision Assessment?: Yes Eye Alignment: Within Functional Limits Ocular Range of Motion: Within Functional Limits Alignment/Gaze Preference:  Within Defined Limits Tracking/Visual Pursuits: Unable to hold eye  position out of midline                Pertinent Vitals/Pain Pain Assessment: Faces Faces Pain Scale: Hurts even more Pain Location: back of head and neck Pain Descriptors / Indicators: Aching Pain Intervention(s): Monitored during session;Limited activity within patient's tolerance;Premedicated before session     Hand Dominance Right   Extremity/Trunk Assessment Upper Extremity Assessment Upper Extremity Assessment: Generalized weakness   Lower Extremity Assessment Lower Extremity Assessment: Defer to PT evaluation       Communication Communication Communication: No difficulties   Cognition Arousal/Alertness: Awake/alert Behavior During Therapy: WFL for tasks assessed/performed Overall Cognitive Status: Within Functional Limits for tasks assessed                                 General Comments: Not specifically tested, but does still report hearing people (wonder if this is more of a tinnitus?) she does seemingly grossly have equal hearing bil as tested with finger rub test.    General Comments                  Home Living Family/patient expects to be discharged to:: Private residence Living Arrangements: Alone Available Help at Discharge: Family Type of Home: Mobile home Home Access: Stairs to enter Technical brewer of Steps: 3 Entrance Stairs-Rails: Right       Bathroom Shower/Tub: Occupational psychologist: Standard     Home Equipment: None;Walker - 4 wheels;Cane - single point;Shower seat - built in;Grab bars - tub/shower          Prior Functioning/Environment Level of Independence: Independent                 OT Problem List: Decreased strength;Impaired balance (sitting and/or standing);Pain;Decreased range of motion;Decreased activity tolerance;Decreased knowledge of use of DME or AE      OT Treatment/Interventions: Self-care/ADL training;DME and/or AE instruction;Therapeutic activities;Balance  training;Therapeutic exercise;Patient/family education    OT Goals(Current goals can be found in the care plan section) Acute Rehab OT Goals Patient Stated Goal: To go home; to take a shower  OT Goal Formulation: With patient Time For Goal Achievement: 10/04/17 Potential to Achieve Goals: Good  OT Frequency: Min 2X/week                             AM-PAC PT "6 Clicks" Daily Activity     Outcome Measure Help from another person eating meals?: None Help from another person taking care of personal grooming?: A Little Help from another person toileting, which includes using toliet, bedpan, or urinal?: A Little Help from another person bathing (including washing, rinsing, drying)?: A Lot Help from another person to put on and taking off regular upper body clothing?: A Little Help from another person to put on and taking off regular lower body clothing?: A Lot 6 Click Score: 17   End of Session Equipment Utilized During Treatment: Gait belt Nurse Communication: Mobility status  Activity Tolerance: Patient tolerated treatment well Patient left: in bed;with call bell/phone within reach;with bed alarm set;with family/visitor present  OT Visit Diagnosis: Unsteadiness on feet (R26.81);History of falling (Z91.81)                Time: 6295-2841 OT Time Calculation (min): 29 min Charges:  OT General Charges $OT Visit: 1 Visit OT  Evaluation $OT Eval Moderate Complexity: 1 Mod OT Treatments $Self Care/Home Management : 8-22 mins G-Codes:     Lou Cal, OT Pager [phone] 09/20/2017   Raymondo Band 09/20/2017, 2:04 PM

## 2017-09-20 NOTE — Progress Notes (Signed)
Subjective: Patient feeling achy and sore.  P.o. intake remains limited.  Follow-up CT of the brain without contrast this morning shows slight decrease in small left parafalcine subdural hematoma.  There remains no mass-effect.  Objective: Vital signs in last 24 hours: Vitals:   09/19/17 2019 09/20/17 0041 09/20/17 0434 09/20/17 0459  BP: 124/77 (!) 126/41 (!) 133/53   Pulse: 70 79 70   Resp: 18 20 20    Temp: 98.5 F (36.9 C) 99 F (37.2 C) 98.6 F (37 C)   TempSrc: Oral Oral Oral   SpO2: 95% 95% 92%   Weight:    98.2 kg (216 lb 7.9 oz)  Height:        Intake/Output from previous day: 06/03 0701 - 06/04 0700 In: 500 [I.V.:500] Out: -  Intake/Output this shift: No intake/output data recorded.  Physical Exam: Awake and alert, oriented to name, hospital, and June 2019.  Following commands.  Speech fluent.  Pupils 3 mm bilaterally, round, reactive to light.  EOMI.  Facial movement symmetrical.  Moving all 4 extremities well.  No drift of upper extremities.  CBC Recent Labs    09/19/17 0502 09/20/17 0548  WBC 7.0 6.6  HGB 7.1* 7.9*  HCT 22.9* 25.2*  PLT 161 173   BMET Recent Labs    09/18/17 1135 09/19/17 0502  NA 138 141  K 4.2 4.3  CL 108 110  CO2 22 22  GLUCOSE 117* 118*  BUN 36* 41*  CREATININE 1.44* 1.48*  CALCIUM 8.7* 8.5*    Studies/Results: Ct Head Wo Contrast  Result Date: 09/20/2017 CLINICAL DATA:  Head trauma, assess for intracranial hemorrhage. EXAM: CT HEAD WITHOUT CONTRAST TECHNIQUE: Contiguous axial images were obtained from the base of the skull through the vertex without intravenous contrast. COMPARISON:  CT head September 18, 2017 FINDINGS: BRAIN: 4 mm parafalcine dense subdural hematoma was 7 mm. No intraparenchymal hemorrhage, mass effect, midline shift or acute large vascular territory infarct. Right occipital lobe encephalomalacia. Overt basal ganglia infarct. Patchy to confluent supratentorial white matter hypodensities. No parenchymal brain volume  loss for age. No hydrocephalus. Basal cisterns are patent. VASCULAR: Moderate to severe calcific atherosclerosis of the carotid siphons. SKULL: No skull fracture. Moderate left frontal scalp hematoma. No subcutaneous gas or radiopaque foreign bodies. SINUSES/ORBITS: Moderate paranasal sinusitis with relative sparing of the frontal sinuses. Trace mastoid effusions.The included ocular globes and orbital contents are non-suspicious. Status post bilateral ocular lens implants. OTHER: Of facial soft tissue swelling and subcutaneous fat stranding compatible with contusion. IMPRESSION: 1. 4 mm residual parafalcine subdural hematoma, decreased from 7 mm. 2. 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 3. Moderate to severe chronic small vessel ischemic changes. Old right basal ganglia infarct. Electronically Signed   By: Elon Alas M.D.   On: 09/20/2017 05:40   Ct Head Wo Contrast  Result Date: 09/18/2017 CLINICAL DATA:  Fall this morning, hematoma to forehead, bleeding from nose. EXAM: CT HEAD WITHOUT CONTRAST CT MAXILLOFACIAL WITHOUT CONTRAST CT CERVICAL SPINE WITHOUT CONTRAST TECHNIQUE: Multidetector CT imaging of the head, cervical spine, and maxillofacial structures were performed using the standard protocol without intravenous contrast. Multiplanar CT image reconstructions of the cervical spine and maxillofacial structures were also generated. COMPARISON:  Brain MRI dated 05/02/2017. FINDINGS: CT HEAD FINDINGS Brain: Small acute subdural hemorrhage located just to the LEFT of the interhemispheric fissure, at the upper margin of the anterior falx, measuring 7 mm thickness, without significant mass effect. No additional extra-axial hemorrhage. No parenchymal hemorrhage or evidence of parenchymal  edema. Chronic small vessel ischemic changes are seen throughout the bilateral periventricular and subcortical white matter regions. There is an old infarct within the RIGHT occipital lobe with associated  encephalomalacia. No hydrocephalus. Vascular: There are chronic calcified atherosclerotic changes of the large vessels at the skull base. No unexpected hyperdense vessel. Skull: No skull fracture or displacement. Other: Large soft tissue hematoma overlying the lower LEFT frontal bone. No underlying fracture. Slightly displaced fracture at the anterior aspects of the LEFT nasal bone. CT MAXILLOFACIAL FINDINGS Osseous: Lower frontal bones are intact and normally aligned. Osseous structures about the orbits are intact and normally aligned bilaterally. Bilateral zygomatic arches and pterygoid plates are intact. No mandible fracture or displacement seen. A focal lucency within the anterior RIGHT mandible appears to have well corticated margins indicating vessel foramen, not appreciably changed compared to the earlier facial bone CT of 12/23/2008. Incidental note made of chronic degenerative change at the RIGHT TMJ with associated mild subluxation of the mandibular condyle. Slightly displaced fracture at the anterior aspects of the LEFT nasal bone. Questionable minimally displaced fracture at the upper margin of the nasal septum. Chronic rightward deviation of the nasal septum. Orbits: Orbital globes appear intact and symmetric in configuration. No retro-orbital hemorrhage or edema. Sinuses: Fluid levels within the bilateral maxillary sinuses, sphenoid sinus and ethmoid air cells Soft tissues: Prominent soft tissue edema/hematoma overlying the LEFT lower frontal bone, medial LEFT orbit and nasal bones. CT CERVICAL SPINE FINDINGS Alignment: No evidence of acute vertebral body subluxation. Skull base and vertebrae: No fracture line or displaced fracture fragment. Facet joints appear intact and normally aligned throughout. Anterior cervical fusion hardware appears intact and appropriately positioned at the C5 through C7 levels. Soft tissues and spinal canal: No prevertebral fluid or swelling. No visible canal hematoma. Disc  levels:  No significant central canal stenosis at any level. Upper chest: No acute findings. Other: Bilateral carotid atherosclerosis. IMPRESSION: 1. Small acute subdural hemorrhage along the anterior aspects of the interhemispheric fissure, measuring 7 mm greatest thickness, without significant mass effect. No other intracranial hemorrhage. No skull fracture. 2. 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 Additional chronic small vessel ischemic changes throughout the white matter regions bilaterally. 3. Large soft tissue hematoma overlying the LEFT lower frontal bone. No underlying fracture. 4. Slightly displaced fracture of the LEFT nasal bone, anterior aspect. Questionable minimally displaced fracture of the underlying anterior nasal septum. Associated fluid levels within the paranasal sinuses. 5. No other facial bone fracture or displacement seen. 6. No fracture or acute subluxation within the cervical spine. Anterior cervical fusion hardware within the lower cervical spine appears intact and appropriately positioned. These results were called by telephone at the time of interpretation on 09/18/2017 at 11:07 am to Dr. Isla Pence , who verbally acknowledged these results. Electronically Signed   By: Franki Cabot M.D.   On: 09/18/2017 11:09   Ct Cervical Spine Wo Contrast  Result Date: 09/18/2017 CLINICAL DATA:  Fall this morning, hematoma to forehead, bleeding from nose. EXAM: CT HEAD WITHOUT CONTRAST CT MAXILLOFACIAL WITHOUT CONTRAST CT CERVICAL SPINE WITHOUT CONTRAST TECHNIQUE: Multidetector CT imaging of the head, cervical spine, and maxillofacial structures were performed using the standard protocol without intravenous contrast. Multiplanar CT image reconstructions of the cervical spine and maxillofacial structures were also generated. COMPARISON:  Brain MRI dated 05/02/2017. FINDINGS: CT HEAD FINDINGS Brain: Small acute subdural hemorrhage located just to the LEFT of  the interhemispheric fissure, at the upper margin of the anterior falx,  measuring 7 mm thickness, without significant mass effect. No additional extra-axial hemorrhage. No parenchymal hemorrhage or evidence of parenchymal edema. Chronic small vessel ischemic changes are seen throughout the bilateral periventricular and subcortical white matter regions. 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 No hydrocephalus. Vascular: There are chronic calcified atherosclerotic changes of the large vessels at the skull base. No unexpected hyperdense vessel. Skull: No skull fracture or displacement. Other: Large soft tissue hematoma overlying the lower LEFT frontal bone. No underlying fracture. Slightly displaced fracture at the anterior aspects of the LEFT nasal bone. CT MAXILLOFACIAL FINDINGS Osseous: Lower frontal bones are intact and normally aligned. Osseous structures about the orbits are intact and normally aligned bilaterally. Bilateral zygomatic arches and pterygoid plates are intact. No mandible fracture or displacement seen. A focal lucency within the anterior RIGHT mandible appears to have well corticated margins indicating vessel foramen, not appreciably changed compared to the earlier facial bone CT of 12/23/2008. Incidental note made of chronic degenerative change at the RIGHT TMJ with associated mild subluxation of the mandibular condyle. Slightly displaced fracture at the anterior aspects of the LEFT nasal bone. Questionable minimally displaced fracture at the upper margin of the nasal septum. Chronic rightward deviation of the nasal septum. Orbits: Orbital globes appear intact and symmetric in configuration. No retro-orbital hemorrhage or edema. Sinuses: Fluid levels within the bilateral maxillary sinuses, sphenoid sinus and ethmoid air cells Soft tissues: Prominent soft tissue edema/hematoma overlying the LEFT lower frontal bone, medial LEFT orbit and nasal bones.  CT CERVICAL SPINE FINDINGS Alignment: No evidence of acute vertebral body subluxation. Skull base and vertebrae: No fracture line or displaced fracture fragment. Facet joints appear intact and normally aligned throughout. Anterior cervical fusion hardware appears intact and appropriately positioned at the C5 through C7 levels. Soft tissues and spinal canal: No prevertebral fluid or swelling. No visible canal hematoma. Disc levels:  No significant central canal stenosis at any level. Upper chest: No acute findings. Other: Bilateral carotid atherosclerosis. IMPRESSION: 1. Small acute subdural hemorrhage along the anterior aspects of the interhemispheric fissure, measuring 7 mm greatest thickness, without significant mass effect. No other intracranial hemorrhage. No skull fracture. 2. 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 Additional chronic small vessel ischemic changes throughout the white matter regions bilaterally. 3. Large soft tissue hematoma overlying the LEFT lower frontal bone. No underlying fracture. 4. Slightly displaced fracture of the LEFT nasal bone, anterior aspect. Questionable minimally displaced fracture of the underlying anterior nasal septum. Associated fluid levels within the paranasal sinuses. 5. No other facial bone fracture or displacement seen. 6. No fracture or acute subluxation within the cervical spine. Anterior cervical fusion hardware within the lower cervical spine appears intact and appropriately positioned. These results were called by telephone at the time of interpretation on 09/18/2017 at 11:07 am to Dr. Isla Pence , who verbally acknowledged these results. Electronically Signed   By: Franki Cabot M.D.   On: 09/18/2017 11:09   Ct Maxillofacial Wo Contrast  Result Date: 09/18/2017 CLINICAL DATA:  Fall this morning, hematoma to forehead, bleeding from nose. EXAM: CT HEAD WITHOUT CONTRAST CT MAXILLOFACIAL WITHOUT CONTRAST CT CERVICAL SPINE  WITHOUT CONTRAST TECHNIQUE: Multidetector CT imaging of the head, cervical spine, and maxillofacial structures were performed using the standard protocol without intravenous contrast. Multiplanar CT image reconstructions of the cervical spine and maxillofacial structures were also generated. COMPARISON:  Brain MRI dated 05/02/2017. FINDINGS: CT HEAD FINDINGS Brain: Small acute subdural hemorrhage  located just to the LEFT of the interhemispheric fissure, at the upper margin of the anterior falx, measuring 7 mm thickness, without significant mass effect. No additional extra-axial hemorrhage. No parenchymal hemorrhage or evidence of parenchymal edema. Chronic small vessel ischemic changes are seen throughout the bilateral periventricular and subcortical white matter regions. 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 No hydrocephalus. Vascular: There are chronic calcified atherosclerotic changes of the large vessels at the skull base. No unexpected hyperdense vessel. Skull: No skull fracture or displacement. Other: Large soft tissue hematoma overlying the lower LEFT frontal bone. No underlying fracture. Slightly displaced fracture at the anterior aspects of the LEFT nasal bone. CT MAXILLOFACIAL FINDINGS Osseous: Lower frontal bones are intact and normally aligned. Osseous structures about the orbits are intact and normally aligned bilaterally. Bilateral zygomatic arches and pterygoid plates are intact. No mandible fracture or displacement seen. A focal lucency within the anterior RIGHT mandible appears to have well corticated margins indicating vessel foramen, not appreciably changed compared to the earlier facial bone CT of 12/23/2008. Incidental note made of chronic degenerative change at the RIGHT TMJ with associated mild subluxation of the mandibular condyle. Slightly displaced fracture at the anterior aspects of the LEFT nasal bone. Questionable minimally displaced  fracture at the upper margin of the nasal septum. Chronic rightward deviation of the nasal septum. Orbits: Orbital globes appear intact and symmetric in configuration. No retro-orbital hemorrhage or edema. Sinuses: Fluid levels within the bilateral maxillary sinuses, sphenoid sinus and ethmoid air cells Soft tissues: Prominent soft tissue edema/hematoma overlying the LEFT lower frontal bone, medial LEFT orbit and nasal bones. CT CERVICAL SPINE FINDINGS Alignment: No evidence of acute vertebral body subluxation. Skull base and vertebrae: No fracture line or displaced fracture fragment. Facet joints appear intact and normally aligned throughout. Anterior cervical fusion hardware appears intact and appropriately positioned at the C5 through C7 levels. Soft tissues and spinal canal: No prevertebral fluid or swelling. No visible canal hematoma. Disc levels:  No significant central canal stenosis at any level. Upper chest: No acute findings. Other: Bilateral carotid atherosclerosis. IMPRESSION: 1. Small acute subdural hemorrhage along the anterior aspects of the interhemispheric fissure, measuring 7 mm greatest thickness, without significant mass effect. No other intracranial hemorrhage. No skull fracture. 2. 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 Additional chronic small vessel ischemic changes throughout the white matter regions bilaterally. 3. Large soft tissue hematoma overlying the LEFT lower frontal bone. No underlying fracture. 4. Slightly displaced fracture of the LEFT nasal bone, anterior aspect. Questionable minimally displaced fracture of the underlying anterior nasal septum. Associated fluid levels within the paranasal sinuses. 5. No other facial bone fracture or displacement seen. 6. No fracture or acute subluxation within the cervical spine. Anterior cervical fusion hardware within the lower cervical spine appears intact and appropriately positioned. These results were  called by telephone at the time of interpretation on 09/18/2017 at 11:07 am to Dr. Isla Pence , who verbally acknowledged these results. Electronically Signed   By: Franki Cabot M.D.   On: 09/18/2017 11:09    Assessment/Plan: Stable from a neurosurgical perspective both on exam as well as CT.  Spoke with the patient and her daughter who was at the bedside.  Recommend that patient continue off of aspirin and Plavix until Monday, June 17, then both aspirin and Plavix can be resumed.  I have asked that they contact my office and set up an appointment to be seen by  me in the office the week of June 24, with a CT of the brain without contrast on the day of the appointment (my secretary Junie Panning [phone]) will be able to arrange for the appointment and CT.  I will sign off continued inpatient follow-up, but we will plan on seeing the patient the week of June 24.  Please recontact for acute neurosurgical issues in the interim as needed.  Hosie Spangle, MD 09/20/2017, 8:48 AM

## 2017-09-20 NOTE — Progress Notes (Addendum)
Physical Therapy Treatment Patient Details Name: Valerie Ramirez MRN: [id number] DOB: 01/04/1933 Today's Date: 09/20/2017    History of Present Illness Patient is a pleasant 84 year old female with history of hypothyroidism, hypertension, hyperlipidemia, coronary artery disease who had a ground-level fall in her kitchen and fell face first onto the hard floor.  She came to the hospital with facial trauma, was found to have subdural hematoma and a nasal bone fracture with significant epistaxis.  PMH positive for HTN, cardiomyopathy, GERD,, endometrial CA.    PT Comments    Continued vestibular assessment completed with no signs of BPPV with both horizontal and posterior canal modified testing (using the bed to assist with positioning due to intolerance of neck extension).  Pt had no report of spinning and no signs of typical nystagmus associated with positive testing, however, she did report "spinning dizziness" at one point during gait (straight gait, not while turning) and had to sit and rest for a minute to let it pass.  Vestibular therapist will continue to follow and assess.  Pt and family report that [facility] is their preference for rehab before returning home.    Follow Up Recommendations  SNF;Supervision/Assistance - 24 hour([facility] is family's preference.)     Equipment Recommendations  None recommended by PT    Recommendations for Other Services   NA     Precautions / Restrictions Precautions Precautions: Fall    Mobility  Bed Mobility Overal bed mobility: Needs Assistance Bed Mobility: Supine to Sit;Sit to Supine     Supine to sit: Min assist;HOB elevated Sit to supine: Min assist   General bed mobility comments: Min assit to help pt go to sitting EOB.  heavy use of railing for leverage.   Transfers Overall transfer level: Needs assistance Equipment used: 1 person hand held assist Transfers: Sit to/from Stand Sit to Stand: Min assist          General transfer comment: Min hand held assist to help pt pull up to standing from bed.   Ambulation/Gait Ambulation/Gait assistance: Min assist Ambulation Distance (Feet): 25 Feet Assistive device: 1 person hand held assist Gait Pattern/deviations: Step-through pattern;Staggering left;Staggering right     General Gait Details: mildly staggering gait pattern and had to stop once at the sink and sit and rest because of dizziness.  It took ~1 minute to pass in sitting.  Pt reported it was spinning, not lightheadedness.  It resolved and we were able to continue walking laps around the room turning to both sides without reports of dizziness.  Daughter reports she has not really eaten any solid food since she has been here as well which may make her feel funny.            Balance Overall balance assessment: Needs assistance Sitting-balance support: Feet supported;No upper extremity supported Sitting balance-Leahy Scale: Good     Standing balance support: Single extremity supported Standing balance-Leahy Scale: Poor Standing balance comment: needs external support for balance.              09/20/17 1129  Vestibular Assessment  General Observation Pt reports she has not been dizzy today getting up and down to [facility].  She is still hearing people?  and head and neck are very sore, was pre medicated with pain meds.   Symptom Behavior  Type of Dizziness Spinning  Frequency of Dizziness intermittent  Duration of Dizziness almost a minute today  Aggravating Factors Activity in general  Relieving Factors Rest;Closing eyes  Occulomotor Exam  Occulomotor Alignment Abnormal (does not quite looks symmetrical)  Spontaneous Absent  Gaze-induced Absent  Smooth Pursuits Saccades  Auditory  Comments hearing grossly equal bil with finger rub test  Positional Testing  Dix-Hallpike Dix-Hallpike Right;Dix-Hallpike Left  Horizontal Canal Testing Horizontal Canal Right;Horizontal Canal Left   Dix-Hallpike Right  Dix-Hallpike Right Duration 0  Dix-Hallpike Right Symptoms No nystagmus  Dix-Hallpike Left  Dix-Hallpike Left Duration 0  Dix-Hallpike Left Symptoms No nystagmus  Horizontal Canal Right  Horizontal Canal Right Duration 0  Horizontal Canal Right Symptoms Normal  Horizontal Canal Left  Horizontal Canal Left Duration 0  Horizontal Canal Left Symptoms Normal                    Cognition Arousal/Alertness: Awake/alert Behavior During Therapy: WFL for tasks assessed/performed Overall Cognitive Status: Within Functional Limits for tasks assessed                                 General Comments: Not specifically tested, but does still report hearing people (wonder if this is more of a tinnitus?) she does seemingly grossly have equal hearing bil as tested with finger rub test.              Pertinent Vitals/Pain Pain Assessment: Faces Faces Pain Scale: Hurts whole lot Pain Location: back of head and neck Pain Descriptors / Indicators: Aching Pain Intervention(s): Limited activity within patient's tolerance;Monitored during session;Premedicated before session;Repositioned        PT Goals (current goals can now be found in the care plan section) Acute Rehab PT Goals Patient Stated Goal: To go home Progress towards PT goals: Progressing toward goals    Frequency    Min 3X/week      PT Plan Discharge plan needs to be updated;Frequency needs to be updated       AM-PAC PT "6 Clicks" Daily Activity  Outcome Measure  Difficulty turning over in bed (including adjusting bedclothes, sheets and blankets)?: Unable Difficulty moving from lying on back to sitting on the side of the bed? : Unable Difficulty sitting down on and standing up from a chair with arms (e.g., wheelchair, bedside commode, etc,.)?: Unable Help needed moving to and from a bed to chair (including a wheelchair)?: A Little Help needed walking in hospital room?: A  Little Help needed climbing 3-5 steps with a railing? : A Lot 6 Click Score: 11    End of Session   Activity Tolerance: Patient limited by fatigue;Patient limited by pain Patient left: in bed;with family/visitor present;Other (comment)(seated EOB with daughter and OT present)   PT Visit Diagnosis: Other abnormalities of gait and mobility (R26.89);Dizziness and giddiness (R42)     Time: 9244-6286 PT Time Calculation (min) (ACUTE ONLY): 24 min  Charges:  $Gait Training: 8-22 mins $Therapeutic Activity: 8-22 mins          Dolorez Jeffrey B. Jani Ploeger, PT, DPT [phone]            09/20/2017, 11:39 AM

## 2017-09-20 NOTE — Progress Notes (Signed)
PROGRESS NOTE    Valerie Ramirez  WUJ:[id number] DOB: April 11, 1933 DOA: 09/18/2017 PCP: Valerie Greenspan, MD   Brief Narrative: Patient is a pleasant 84 year old female with history of hypothyroidism, hypertension, hyperlipidemia, coronary artery disease who had a ground-level fall in her kitchen and fell face first onto the hard floor.  She came to the hospital with facial trauma, was found to have subdural hematoma and a nasal bone fracture with significant epistaxis.  Neurosurgery and ENT were consulted in the ED.    Assessment & Plan:   Principal Problem:   Subdural hematoma (HCC) Active Problems:   Essential hypertension   CKD (chronic kidney disease), stage III (HCC)   CAD (coronary artery disease)   Subdural hematoma Repeat CT head this morning showed improvement in the hematoma. Appreciate neurosurgery recommendations.  Plan to hold aspirin and Plavix till October 03, 2017, after which they both can be resumed. Recommend outpatient follow-up with neurosurgery scheduled on June 24.  Also repeat outpatient CT of the head without contrast on follow-up.   Recurrent falls PT evaluation recommending 24-hour supervision with home PT versus SNF.  Patient and family wants to pursue SNF at this time.  Social work consulted.    Epistaxis/nasal fracture ENT consulted. Status post a balloon pack on the right.  Epistaxis stopped.    Coronary artery disease Patient currently denies any chest pain or shortness of breath.  Discussed with on-call cardiologist Dr. Aundra Ramirez okay to hold DAPT  for now   Anemia of chronic disease secondary to stage III CKD. Hemoglobin at 7.9 this morning improved after 1 unit of PRBC transfusion.    Stage III CKD Creatinine at baseline.   DVT prophylaxis: SCDs Code Status: Full code Family Communication: Discussed with family at bedside  disposition Plan: Plan for SNF when bed available  Consultants:   Neurosurgery Dr. [place] Ramirez  ENT  Procedures:  Status post balloon pack on the right for epistaxis Antimicrobials: Keflex  Subjective: Valerie Ramirez reports soreness back of the head and the nose.   Objective: Vitals:   09/20/17 0041 09/20/17 0434 09/20/17 0459 09/20/17 0900  BP: (!) 126/41 (!) 133/53  (!) 142/52  Pulse: 79 70  68  Resp: 20 20  20   Temp: 99 F (37.2 C) 98.6 F (37 C)  98.6 F (37 C)  TempSrc: Oral Oral  Oral  SpO2: 95% 92%  95%  Weight:   98.2 kg (216 lb 7.9 oz)   Height:        Intake/Output Summary (Last 24 hours) at 09/20/2017 1051 Last data filed at 09/19/2017 1445 Gross per 24 hour  Intake 500 ml  Output -  Net 500 ml   Filed Weights   09/18/17 1552 09/20/17 0459  Weight: 97.1 kg (214 lb 1.1 oz) 98.2 kg (216 lb 7.9 oz)    Examination:  General exam: Bilateral periorbital contusions and bruising on the lip, nasal balloon in place on the right.  No apparent distress. Respiratory system: Clear to auscultation. Respiratory effort normal. Cardiovascular system: S1 & S2 heard, RRR. No JVD, murmurs, rubs, gallops or clicks. No pedal edema. Gastrointestinal system: Abdomen is nondistended, soft and nontender. No organomegaly or masses felt. Normal bowel sounds heard. Central nervous system: Alert and oriented. No focal neurological deficits. Extremities: Symmetric 5 x 5 power. Skin: No rashes, lesions or ulcers Psychiatry: Mood & affect appropriate.     Data Reviewed: I have personally reviewed following labs and imaging studies  CBC: Recent Labs  Lab 09/18/17 1135  09/19/17 0502 09/20/17 0548  WBC 10.9* 7.0 6.6  NEUTROABS 7.8*  --   --   HGB 8.9* 7.1* 7.9*  HCT 29.3* 22.9* 25.2*  MCV 91.0 91.2 89.4  PLT 200 161 161   Basic Metabolic Panel: Recent Labs  Lab 09/18/17 1135 09/19/17 0502  NA 138 141  K 4.2 4.3  CL 108 110  CO2 22 22  GLUCOSE 117* 118*  BUN 36* 41*  CREATININE 1.44* 1.48*  CALCIUM 8.7* 8.5*   GFR: Estimated Creatinine Clearance: 31.5 mL/min (A) (by C-G formula based on SCr  of 1.48 mg/dL (H)). Liver Function Tests: Recent Labs  Lab 09/19/17 0502  AST 50*  ALT 40  ALKPHOS 89  BILITOT 1.0  PROT 5.3*  ALBUMIN 3.0*   No results for input(s): LIPASE, AMYLASE in the last 168 hours. No results for input(s): AMMONIA in the last 168 hours. Coagulation Profile: Recent Labs  Lab 09/18/17 1135  INR 1.06   Cardiac Enzymes: No results for input(s): CKTOTAL, CKMB, CKMBINDEX, TROPONINI in the last 168 hours. BNP (last 3 results) No results for input(s): PROBNP in the last 8760 hours. HbA1C: No results for input(s): HGBA1C in the last 72 hours. CBG: No results for input(s): GLUCAP in the last 168 hours. Lipid Profile: No results for input(s): CHOL, HDL, LDLCALC, TRIG, CHOLHDL, LDLDIRECT in the last 72 hours. Thyroid Function Tests: No results for input(s): TSH, T4TOTAL, FREET4, T3FREE, THYROIDAB in the last 72 hours. Anemia Panel: No results for input(s): VITAMINB12, FOLATE, FERRITIN, TIBC, IRON, RETICCTPCT in the last 72 hours. Sepsis Labs: No results for input(s): PROCALCITON, LATICACIDVEN in the last 168 hours.  No results found for this or any previous visit (from the past 240 hour(s)).       Radiology Studies: Ct Head Wo Contrast  Result Date: 09/20/2017 CLINICAL DATA:  Head trauma, assess for intracranial hemorrhage. EXAM: CT HEAD WITHOUT CONTRAST TECHNIQUE: Contiguous axial images were obtained from the base of the skull through the vertex without intravenous contrast. COMPARISON:  CT head September 18, 2017 FINDINGS: BRAIN: 4 mm parafalcine dense subdural hematoma was 7 mm. No intraparenchymal hemorrhage, mass effect, midline shift or acute large vascular territory infarct. Right occipital lobe encephalomalacia. Overt basal ganglia infarct. Patchy to confluent supratentorial white matter hypodensities. No parenchymal brain volume loss for age. No hydrocephalus. Basal cisterns are patent. VASCULAR: Moderate to severe calcific atherosclerosis of the carotid  siphons. SKULL: No skull fracture. Moderate left frontal scalp hematoma. No subcutaneous gas or radiopaque foreign bodies. SINUSES/ORBITS: Moderate paranasal sinusitis with relative sparing of the frontal sinuses. Trace mastoid effusions.The included ocular globes and orbital contents are non-suspicious. Status post bilateral ocular lens implants. OTHER: Of facial soft tissue swelling and subcutaneous fat stranding compatible with contusion. IMPRESSION: 1. 4 mm residual parafalcine subdural hematoma, decreased from 7 mm. 2. 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 3. Moderate to severe chronic small vessel ischemic changes. Old right basal ganglia infarct. Electronically Signed   By: Elon Alas M.D.   On: 09/20/2017 05:40        Scheduled Meds: . amLODipine  5 mg Oral Daily  . carvedilol  25 mg Oral BID WC  . cephALEXin  250 mg Oral Q6H  . colchicine  0.6 mg Oral BID  . DULoxetine  20 mg Oral Daily  . losartan  25 mg Oral Daily  .  morphine injection  4 mg Intramuscular Once  . rosuvastatin  10 mg Oral q1800  . sodium chloride flush  3  mL Intravenous Q12H   Continuous Infusions: . sodium chloride Stopped (09/18/17 1617)  . sodium chloride       LOS: 2 days    Time spent: 35 minutes.     Hosie Poisson, MD [facility] Pager [phone] If 7PM-7AM, please contact night-coverage [email or web address] Password [email or web address] 09/20/2017, 10:51 AM

## 2017-09-20 NOTE — NC FL2 (Signed)
[place] LEVEL OF CARE SCREENING TOOL     IDENTIFICATION  Patient Name: Valerie Ramirez Birthdate: Oct 30, 1932 Sex: female Admission Date (Current Location): 09/18/2017  [facility] and [place] Number:  Herbalist and Address:  [facility], [place] [address], [place], [place] [address]      Provider Number: [id number]  Attending Physician Name and Address:  Hosie Poisson, MD  Relative Name and Phone Number:       Current Level of Care: Hospital Recommended Level of Care: [place] Prior Approval Number:    Date Approved/Denied:   PASRR Number: [id number]  Discharge Plan: SNF    Current Diagnoses: Patient Active Problem List   Diagnosis Date Noted  . Subdural hematoma ([place]) 09/18/2017  . CAD (coronary artery disease) 09/18/2017  . Closed fracture of nasal bones   . Epistaxis   . Elevated transaminase level 08/03/2017  . Radial artery injury, left, sequela 06/15/2017  . H/O: CVA (cerebrovascular accident) 05/26/2017  . Sick sinus syndrome ([place]) 05/25/2017  . Unstable angina ([place])   . Exertional chest pain 04/05/2017  . CKD (chronic kidney disease), stage III ([place]) 04/05/2017  . Normocytic normochromic anemia 04/05/2017  . Thoracic back pain 09/15/2016  . History of radius fracture 06/01/2016  . AKI (acute kidney injury) ([place]) 04/25/2016  . Status post cervical spinal fusion 08/18/2015  . Osteoarthritis, hip, bilateral 08/12/2014  . Hearing loss in right ear 06/12/2014  . Colon cancer screening 08/10/2013  . Seborrheic keratoses, inflamed 08/10/2013  . History of fall 04/10/2013  . Encounter for Medicare annual wellness exam 08/08/2012  . Gout 07/14/2012  . History of endometrial cancer 05/25/2012  . Degenerative joint disease of cervical spine 05/03/2011  . Other screening mammogram 03/31/2011  . Hyperglycemia 09/26/2007  . Morbid obesity ([place]) 07/12/2007  . Essential hypertension 07/11/2007  . History  of cardiomyopathy 07/11/2007  . Osteoarthritis 07/11/2007  . 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 Hypothyroidism 10/05/2006  . HYPERCHOLESTEROLEMIA, PURE 10/05/2006    Orientation RESPIRATION BLADDER Height & Weight     Time, Self, Situation, Place  Normal Continent Weight: 216 lb 7.9 oz (98.2 kg) Height:  5\' 2"  (157.5 cm)  BEHAVIORAL SYMPTOMS/MOOD NEUROLOGICAL BOWEL NUTRITION STATUS      Continent Diet(heart healthy)  AMBULATORY STATUS COMMUNICATION OF NEEDS Skin   Limited Assist Verbally Bruising, Skin abrasions                       Personal Care Assistance Level of Assistance  Bathing, Feeding, Dressing Bathing Assistance: Limited assistance Feeding assistance: Limited assistance Dressing Assistance: Limited assistance     Functional Limitations Info  Sight, Hearing, Speech Sight Info: Adequate Hearing Info: Adequate Speech Info: Adequate    SPECIAL CARE FACTORS FREQUENCY  PT (By licensed PT), OT (By licensed OT)     PT Frequency: 5x/wk OT Frequency: 5x/wk            Contractures Contractures Info: Not present    Additional Factors Info  Code Status, Allergies, Psychotropic Code Status Info: Full Allergies Info: Allopurinol, Lipitor Atorvastatin Calcium, Simvastatin, Tape Psychotropic Info: Cymbalta 20mg  daily         Current Medications (09/20/2017):  This is the current hospital active medication list Current Facility-Administered Medications  Medication Dose Route Frequency Provider Last Rate Last Dose  . 0.9 %  sodium chloride infusion   Intravenous Once Isla Pence, MD   Stopped at 09/18/17 1617  .  0.9 %  sodium chloride infusion  250 mL Intravenous PRN Jani Gravel, MD      . acetaminophen (TYLENOL) tablet 650 mg  650 mg Oral Q6H PRN Jani Gravel, MD       Or  . acetaminophen (TYLENOL) suppository 650 mg  650 mg Rectal Q6H PRN Jani Gravel, MD      . amLODipine (NORVASC) tablet 5 mg  5 mg Oral Daily Jani Gravel, MD   5 mg at 09/20/17  0912  . carvedilol (COREG) tablet 25 mg  25 mg Oral BID WC Jani Gravel, MD   25 mg at 09/20/17 0912  . cephALEXin (KEFLEX) capsule 250 mg  027 mg Oral O5D Jodi Marble, MD   664 mg at 09/20/17 1223  . colchicine tablet 0.6 mg  0.6 mg Oral BID Jani Gravel, MD   0.6 mg at 09/20/17 0912  . DULoxetine (CYMBALTA) DR capsule 20 mg  20 mg Oral Daily Jani Gravel, MD   20 mg at 09/20/17 0912  . loperamide (IMODIUM) capsule 2 mg  2 mg Oral PRN Jani Gravel, MD      . losartan (COZAAR) tablet 25 mg  25 mg Oral Daily Jani Gravel, MD   25 mg at 09/20/17 0912  . morphine 4 MG/ML injection 0.52 mg  0.52 mg Intravenous Q4H PRN Jani Gravel, MD      . morphine 4 MG/ML injection 4 mg  4 mg Intramuscular Once Isla Pence, MD      . ondansetron [facility]) injection 4 mg  4 mg Intravenous Q6H PRN Jani Gravel, MD      . oxyCODONE-acetaminophen (PERCOCET/ROXICET) 5-325 MG per tablet 1 tablet  1 tablet Oral Q4H PRN Jani Gravel, MD   1 tablet at 09/20/17 0912  . polyethylene glycol (MIRALAX / GLYCOLAX) packet 17 g  17 g Oral Daily PRN Jani Gravel, MD   17 g at 09/20/17 0912  . rosuvastatin (CRESTOR) tablet 10 mg  10 mg Oral q1800 Jani Gravel, MD   10 mg at 09/19/17 1807  . sodium chloride (OCEAN) 0.65 % nasal spray 2 spray  2 spray Each Nare PRN Jodi Marble, MD   2 spray at 09/19/17 0503  . sodium chloride flush (NS) 0.9 % injection 3 mL  3 mL Intravenous Q12H Jani Gravel, MD   3 mL at 09/20/17 0913  . sodium chloride flush (NS) 0.9 % injection 3 mL  3 mL Intravenous PRN Jani Gravel, MD         Discharge Medications: Please see discharge summary for a list of discharge medications.  Relevant Imaging Results:  Relevant Lab Results:   Additional Information SS#: [id number]  Geralynn Ochs, LCSW

## 2017-09-21 ENCOUNTER — Encounter: Payer: Self-pay | Admitting: *Deleted

## 2017-09-21 DIAGNOSIS — S022XXA Fracture of nasal bones, initial encounter for closed fracture: Secondary | ICD-10-CM | POA: Diagnosis not present

## 2017-09-21 DIAGNOSIS — E7849 Other hyperlipidemia: Secondary | ICD-10-CM | POA: Diagnosis not present

## 2017-09-21 DIAGNOSIS — Z006 Encounter for examination for normal comparison and control in clinical research program: Secondary | ICD-10-CM

## 2017-09-21 DIAGNOSIS — G4709 Other insomnia: Secondary | ICD-10-CM | POA: Diagnosis not present

## 2017-09-21 DIAGNOSIS — R040 Epistaxis: Secondary | ICD-10-CM | POA: Diagnosis not present

## 2017-09-21 DIAGNOSIS — M109 Gout, unspecified: Secondary | ICD-10-CM | POA: Diagnosis not present

## 2017-09-21 DIAGNOSIS — R2681 Unsteadiness on feet: Secondary | ICD-10-CM | POA: Diagnosis not present

## 2017-09-21 DIAGNOSIS — N183 Chronic kidney disease, stage 3 (moderate): Secondary | ICD-10-CM | POA: Diagnosis not present

## 2017-09-21 DIAGNOSIS — R278 Other lack of coordination: Secondary | ICD-10-CM | POA: Diagnosis not present

## 2017-09-21 DIAGNOSIS — M6389 Disorders of muscle in diseases classified elsewhere, multiple sites: Secondary | ICD-10-CM | POA: Diagnosis not present

## 2017-09-21 DIAGNOSIS — I2510 Atherosclerotic heart disease of native coronary artery without angina pectoris: Secondary | ICD-10-CM | POA: Diagnosis not present

## 2017-09-21 DIAGNOSIS — R1311 Dysphagia, oral phase: Secondary | ICD-10-CM | POA: Diagnosis not present

## 2017-09-21 DIAGNOSIS — M6281 Muscle weakness (generalized): Secondary | ICD-10-CM | POA: Diagnosis not present

## 2017-09-21 DIAGNOSIS — M542 Cervicalgia: Secondary | ICD-10-CM | POA: Diagnosis not present

## 2017-09-21 DIAGNOSIS — D6489 Other specified anemias: Secondary | ICD-10-CM | POA: Diagnosis not present

## 2017-09-21 DIAGNOSIS — S065X9A Traumatic subdural hemorrhage with loss of consciousness of unspecified duration, initial encounter: Secondary | ICD-10-CM | POA: Diagnosis not present

## 2017-09-21 DIAGNOSIS — S022XXD Fracture of nasal bones, subsequent encounter for fracture with routine healing: Secondary | ICD-10-CM | POA: Diagnosis not present

## 2017-09-21 DIAGNOSIS — I10 Essential (primary) hypertension: Secondary | ICD-10-CM | POA: Diagnosis not present

## 2017-09-21 DIAGNOSIS — R41841 Cognitive communication deficit: Secondary | ICD-10-CM | POA: Diagnosis not present

## 2017-09-21 DIAGNOSIS — S065X0D Traumatic subdural hemorrhage without loss of consciousness, subsequent encounter: Secondary | ICD-10-CM | POA: Diagnosis not present

## 2017-09-21 MED ORDER — CEPHALEXIN 250 MG PO CAPS: 250.0000 mg | ORAL_CAPSULE | Freq: Four times a day (QID) | ORAL | 0 refills | Status: AC

## 2017-09-21 MED ORDER — TRAMADOL HCL 50 MG PO TABS: 50.0000 mg | ORAL_TABLET | Freq: Four times a day (QID) | ORAL | 0 refills | Status: AC | PRN

## 2017-09-21 MED ORDER — POLYETHYLENE GLYCOL 3350 17 G PO PACK: 17.0000 g | PACK | Freq: Every day | ORAL | 0 refills | Status: DC | PRN

## 2017-09-21 NOTE — Clinical Social Work Note (Signed)
Clinical Social Work Assessment  Patient Details  Name: Valerie Ramirez MRN: [id number] Date of Birth: 1932/07/07  Date of referral:  09/20/17               Reason for consult:  Facility Placement                Permission sought to share information with:  Chartered certified accountant granted to share information::  Yes, Verbal Permission Granted  Name::        Agency::  [facility]  Relationship::     Contact Information:     Housing/Transportation Living arrangements for the past 2 months:  Single Family Home Source of Information:  Patient, Medical Team Patient Interpreter Needed:  None Criminal Activity/Legal Involvement Pertinent to Current Situation/Hospitalization:  No - Comment as needed Significant Relationships:  Adult Children Lives with:  Self Do you feel safe going back to the place where you live?  Yes Need for family participation in patient care:  No (Coment)  Care giving concerns:  Patient from home alone and would benefit from SNF at discharge.   Social Worker assessment / plan:  CSW alerted from team that plan has changed from home health to SNF, and patient would like [facility] for SNF at discharge. CSW sent out referral and will follow up for bed offer.  Employment status:  Retired Forensic scientist:  Medicare PT Recommendations:  [place] / Referral to community resources:  [place]  Patient/Family's Response to care:  Patient agreeable to SNF placement.  Patient/Family's Understanding of and Emotional Response to Diagnosis, Current Treatment, and Prognosis: Patient aware of deficits and that she would benefit from SNF placement before returning home. Patient says that she will go to [facility], and she knows the owners so they will definitely have a bed for her tomorrow to be able to go.  Emotional Assessment Appearance:  Appears stated age Attitude/Demeanor/Rapport:  Engaged Affect (typically  observed):  Pleasant Orientation:  Oriented to Self, Oriented to Place, Oriented to  Time, Oriented to Situation Alcohol / Substance use:  Not Applicable Psych involvement (Current and /or in the community):  No (Comment)  Discharge Needs  Concerns to be addressed:  Care Coordination Readmission within the last 30 days:  No Current discharge risk:  Dependent with Mobility, Lives alone Barriers to Discharge:  Continued Medical Work up, [place], [place] 09/21/2017, 11:33 AM

## 2017-09-21 NOTE — Progress Notes (Signed)
[facility] ENT to see if they would come remove the nasal rocket in patient's nose prior to her being discharged today instead of waiting til tomorrow. They called back and said for her to come straight there when she leaves the hospital and they will remove before she got to [facility] for rehab.

## 2017-09-21 NOTE — Discharge Summary (Signed)
Physician Discharge Summary  Valerie Ramirez IEP:[id number] DOB: 1933-04-04 DOA: 09/18/2017  PCP: Abner Greenspan, MD  Admit date: 09/18/2017 Discharge date: 09/21/2017  Admitted From: Home  Disposition:  SNF  Recommendations for Outpatient Follow-up:  1. Follow up with PCP in 1-2 weeks 2. Please obtain BMP/CBC in one week Please follow up with ENT tomorrow to remove the balloon pack Please follow up with neuro surgery as recommended.  Recommend getting an outpatient CT head without contrast before the neuro surgery appointment.  Recommend  holding aspirin and Plavix till October 03, 2017, after which they both can be resumed.    Discharge Condition:guarded.  CODE STATUS: full code.  Diet recommendation: Heart Healthy   Brief/Interim Summary: Patient isa pleasant 84 year old female with history of hypothyroidism, hypertension, hyperlipidemia, coronary artery disease who had a ground-level fall in her kitchen and fell face first onto the hard floor. She came to the hospital with facial trauma, was found to have subdural hematoma and a nasal bone fracture with significant epistaxis. Neurosurgery and ENT were consulted in the ED.    Discharge Diagnoses:  Principal Problem:   Subdural hematoma (HCC) Active Problems:   Essential hypertension   CKD (chronic kidney disease), stage III (HCC)   CAD (coronary artery disease)  Subdural hematoma Repeat CT head this morning showed improvement in the hematoma. Appreciate neurosurgery recommendations.  Plan to hold aspirin and Plavix till October 03, 2017, after which they both can be resumed. Recommend outpatient follow-up with neurosurgery scheduled on June 24.  Also repeat outpatient CT of the head without contrast on follow-up.   Recurrent falls PT evaluation recommending 24-hour supervision with home PT versus SNF.  Patient and family wants to pursue SNF at this time.  Social work consulted. Plan for SNF today.     Epistaxis/nasal  fracture ENT consulted. Status post a balloon pack on the right.  Epistaxis stopped. As per ENT recommendations, remove the pack tomorrow in the office.     Coronary artery disease Patient currently denies any chest pain or shortness of breath.  Discussed with on-call cardiologist Dr. Aundra Dubin okay to hold DAPT  for now.   Anemia of chronic disease secondary to stage III CKD. Hemoglobin at 7.9  improved after 1 unit of PRBC transfusion.    Stage III CKD Creatinine at baseline    Discharge Instructions  Discharge Instructions    Diet - low sodium heart healthy   Complete by:  As directed    Discharge instructions   Complete by:  As directed    We have stopped the aspirin and plavix, now, neurosurgery recommends to start the aspirin 81 mg daily and plavix 75 mg daily from 6/17 , follow up with neuro surgery on 6/24 as scheduled and get a repeat CT head without contrast to evaluate the bleed at that point.  Please follow up with ENT on Thursday 09/23/2015 to remove the nasal pack in the office.  Please follow up with PCP in one week.     Allergies as of 09/21/2017      Reactions   Allopurinol Nausea Only   Lipitor [atorvastatin Calcium] Other (See Comments)   Leg ache and ache all over   Simvastatin Other (See Comments)   REACTION: muscle pain   Tape Itching, Rash   "Cannot use paper tape" --- also causes blisters. Use plastic tape      Medication List    STOP taking these medications   aspirin 81 MG chewable tablet  clopidogrel 75 MG tablet Commonly known as:  PLAVIX   HYDROcodone-acetaminophen 5-325 MG tablet Commonly known as:  NORCO/VICODIN     TAKE these medications   acetaminophen 650 MG CR tablet Commonly known as:  TYLENOL Take 650 mg by mouth every 8 (eight) hours as needed for pain.   amLODipine 10 MG tablet Commonly known as:  NORVASC Take 1 tablet (10 mg total) by mouth daily.   carvedilol 25 MG tablet Commonly known as:  COREG Take 1  tablet (25 mg total) by mouth 2 (two) times daily with a meal.   cephALEXin 250 MG capsule Commonly known as:  KEFLEX Take 1 capsule (250 mg total) by mouth every 6 (six) hours for 2 days.   colchicine 0.6 MG tablet Take 1 tablet (0.6 mg total) by mouth 2 (two) times daily.   doxazosin 2 MG tablet Commonly known as:  CARDURA TAKE 1 TABLET (8 MG TOTAL) BY MOUTH AT BEDTIME. What changed:    how much to take  additional instructions   DULoxetine 20 MG capsule Commonly known as:  CYMBALTA TAKE 1 CAPSULE (20 MG TOTAL) BY MOUTH DAILY.   lisinopril 5 MG tablet Commonly known as:  PRINIVIL,ZESTRIL Take 1 tablet (5 mg total) by mouth daily.   loperamide 2 MG capsule Commonly known as:  IMODIUM Take 1 capsule (2 mg total) by mouth as needed for diarrhea or loose stools.   losartan 25 MG tablet Commonly known as:  COZAAR Take 25 mg by mouth daily.   nitroGLYCERIN 0.4 MG SL tablet Commonly known as:  NITROSTAT Place 1 tablet (0.4 mg total) under the tongue every 5 (five) minutes x 3 doses as needed for chest pain.   omega-3 acid ethyl esters 1 g capsule Commonly known as:  LOVAZA Take 1 capsule (1 g total) by mouth 2 (two) times daily.   polyethylene glycol packet Commonly known as:  MIRALAX / GLYCOLAX Take 17 g by mouth daily as needed for mild constipation.   rosuvastatin 10 MG tablet Commonly known as:  CRESTOR Take 1 tablet (10 mg total) by mouth daily at 6 PM.   traMADol 50 MG tablet Commonly known as:  ULTRAM Take 1 tablet (50 mg total) by mouth every 6 (six) hours as needed for up to 2 days.   vitamin B-12 1000 MCG tablet Commonly known as:  CYANOCOBALAMIN Take 1,000 mcg by mouth daily.      Follow-up Information    Jodi Marble, MD Follow up in 4 day(s).   Specialty:  Otolaryngology Contact information: [address] [place] [address] [phone]          Allergies  Allergen Reactions  . Allopurinol Nausea Only  . Lipitor  [Atorvastatin Calcium] Other (See Comments)    Leg ache and ache all over  . Simvastatin Other (See Comments)    REACTION: muscle pain  . Tape Itching and Rash    "Cannot use paper tape" --- also causes blisters. Use plastic tape    Consultations: Neuro surgery ENT   Procedures/Studies: Ct Head Wo Contrast  Result Date: 09/20/2017 CLINICAL DATA:  Head trauma, assess for intracranial hemorrhage. EXAM: CT HEAD WITHOUT CONTRAST TECHNIQUE: Contiguous axial images were obtained from the base of the skull through the vertex without intravenous contrast. COMPARISON:  CT head September 18, 2017 FINDINGS: BRAIN: 4 mm parafalcine dense subdural hematoma was 7 mm. No intraparenchymal hemorrhage, mass effect, midline shift or acute large vascular territory infarct. Right occipital lobe encephalomalacia.  Overt basal ganglia infarct. Patchy to confluent supratentorial white matter hypodensities. No parenchymal brain volume loss for age. No hydrocephalus. Basal cisterns are patent. VASCULAR: Moderate to severe calcific atherosclerosis of the carotid siphons. SKULL: No skull fracture. Moderate left frontal scalp hematoma. No subcutaneous gas or radiopaque foreign bodies. SINUSES/ORBITS: Moderate paranasal sinusitis with relative sparing of the frontal sinuses. Trace mastoid effusions.The included ocular globes and orbital contents are non-suspicious. Status post bilateral ocular lens implants. OTHER: Of facial soft tissue swelling and subcutaneous fat stranding compatible with contusion. IMPRESSION: 1. 4 mm residual parafalcine subdural hematoma, decreased from 7 mm. 2. 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 3. Moderate to severe chronic small vessel ischemic changes. Old right basal ganglia infarct. Electronically Signed   By: Elon Alas M.D.   On: 09/20/2017 05:40   Ct Head Wo Contrast  Result Date: 09/18/2017 CLINICAL DATA:  Fall this morning, hematoma to forehead, bleeding from nose. EXAM: CT HEAD  WITHOUT CONTRAST CT MAXILLOFACIAL WITHOUT CONTRAST CT CERVICAL SPINE WITHOUT CONTRAST TECHNIQUE: Multidetector CT imaging of the head, cervical spine, and maxillofacial structures were performed using the standard protocol without intravenous contrast. Multiplanar CT image reconstructions of the cervical spine and maxillofacial structures were also generated. COMPARISON:  Brain MRI dated 05/02/2017. FINDINGS: CT HEAD FINDINGS Brain: Small acute subdural hemorrhage located just to the LEFT of the interhemispheric fissure, at the upper margin of the anterior falx, measuring 7 mm thickness, without significant mass effect. No additional extra-axial hemorrhage. No parenchymal hemorrhage or evidence of parenchymal edema. Chronic small vessel ischemic changes are seen throughout the bilateral periventricular and subcortical white matter regions. 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 No hydrocephalus. Vascular: There are chronic calcified atherosclerotic changes of the large vessels at the skull base. No unexpected hyperdense vessel. Skull: No skull fracture or displacement. Other: Large soft tissue hematoma overlying the lower LEFT frontal bone. No underlying fracture. Slightly displaced fracture at the anterior aspects of the LEFT nasal bone. CT MAXILLOFACIAL FINDINGS Osseous: Lower frontal bones are intact and normally aligned. Osseous structures about the orbits are intact and normally aligned bilaterally. Bilateral zygomatic arches and pterygoid plates are intact. No mandible fracture or displacement seen. A focal lucency within the anterior RIGHT mandible appears to have well corticated margins indicating vessel foramen, not appreciably changed compared to the earlier facial bone CT of 12/23/2008. Incidental note made of chronic degenerative change at the RIGHT TMJ with associated mild subluxation of the mandibular condyle. Slightly displaced fracture at the  anterior aspects of the LEFT nasal bone. Questionable minimally displaced fracture at the upper margin of the nasal septum. Chronic rightward deviation of the nasal septum. Orbits: Orbital globes appear intact and symmetric in configuration. No retro-orbital hemorrhage or edema. Sinuses: Fluid levels within the bilateral maxillary sinuses, sphenoid sinus and ethmoid air cells Soft tissues: Prominent soft tissue edema/hematoma overlying the LEFT lower frontal bone, medial LEFT orbit and nasal bones. CT CERVICAL SPINE FINDINGS Alignment: No evidence of acute vertebral body subluxation. Skull base and vertebrae: No fracture line or displaced fracture fragment. Facet joints appear intact and normally aligned throughout. Anterior cervical fusion hardware appears intact and appropriately positioned at the C5 through C7 levels. Soft tissues and spinal canal: No prevertebral fluid or swelling. No visible canal hematoma. Disc levels:  No significant central canal stenosis at any level. Upper chest: No acute findings. Other: Bilateral carotid atherosclerosis. IMPRESSION: 1. Small acute subdural hemorrhage along the anterior aspects of the interhemispheric  fissure, measuring 7 mm greatest thickness, without significant mass effect. No other intracranial hemorrhage. No skull fracture. 2. 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 Additional chronic small vessel ischemic changes throughout the white matter regions bilaterally. 3. Large soft tissue hematoma overlying the LEFT lower frontal bone. No underlying fracture. 4. Slightly displaced fracture of the LEFT nasal bone, anterior aspect. Questionable minimally displaced fracture of the underlying anterior nasal septum. Associated fluid levels within the paranasal sinuses. 5. No other facial bone fracture or displacement seen. 6. No fracture or acute subluxation within the cervical spine. Anterior cervical fusion hardware within the lower  cervical spine appears intact and appropriately positioned. These results were called by telephone at the time of interpretation on 09/18/2017 at 11:07 am to Dr. Isla Pence , who verbally acknowledged these results. Electronically Signed   By: Franki Cabot M.D.   On: 09/18/2017 11:09   Ct Cervical Spine Wo Contrast  Result Date: 09/18/2017 CLINICAL DATA:  Fall this morning, hematoma to forehead, bleeding from nose. EXAM: CT HEAD WITHOUT CONTRAST CT MAXILLOFACIAL WITHOUT CONTRAST CT CERVICAL SPINE WITHOUT CONTRAST TECHNIQUE: Multidetector CT imaging of the head, cervical spine, and maxillofacial structures were performed using the standard protocol without intravenous contrast. Multiplanar CT image reconstructions of the cervical spine and maxillofacial structures were also generated. COMPARISON:  Brain MRI dated 05/02/2017. FINDINGS: CT HEAD FINDINGS Brain: Small acute subdural hemorrhage located just to the LEFT of the interhemispheric fissure, at the upper margin of the anterior falx, measuring 7 mm thickness, without significant mass effect. No additional extra-axial hemorrhage. No parenchymal hemorrhage or evidence of parenchymal edema. Chronic small vessel ischemic changes are seen throughout the bilateral periventricular and subcortical white matter regions. 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 No hydrocephalus. Vascular: There are chronic calcified atherosclerotic changes of the large vessels at the skull base. No unexpected hyperdense vessel. Skull: No skull fracture or displacement. Other: Large soft tissue hematoma overlying the lower LEFT frontal bone. No underlying fracture. Slightly displaced fracture at the anterior aspects of the LEFT nasal bone. CT MAXILLOFACIAL FINDINGS Osseous: Lower frontal bones are intact and normally aligned. Osseous structures about the orbits are intact and normally aligned bilaterally. Bilateral zygomatic arches and  pterygoid plates are intact. No mandible fracture or displacement seen. A focal lucency within the anterior RIGHT mandible appears to have well corticated margins indicating vessel foramen, not appreciably changed compared to the earlier facial bone CT of 12/23/2008. Incidental note made of chronic degenerative change at the RIGHT TMJ with associated mild subluxation of the mandibular condyle. Slightly displaced fracture at the anterior aspects of the LEFT nasal bone. Questionable minimally displaced fracture at the upper margin of the nasal septum. Chronic rightward deviation of the nasal septum. Orbits: Orbital globes appear intact and symmetric in configuration. No retro-orbital hemorrhage or edema. Sinuses: Fluid levels within the bilateral maxillary sinuses, sphenoid sinus and ethmoid air cells Soft tissues: Prominent soft tissue edema/hematoma overlying the LEFT lower frontal bone, medial LEFT orbit and nasal bones. CT CERVICAL SPINE FINDINGS Alignment: No evidence of acute vertebral body subluxation. Skull base and vertebrae: No fracture line or displaced fracture fragment. Facet joints appear intact and normally aligned throughout. Anterior cervical fusion hardware appears intact and appropriately positioned at the C5 through C7 levels. Soft tissues and spinal canal: No prevertebral fluid or swelling. No visible canal hematoma. Disc levels:  No significant central canal stenosis at any level. Upper chest: No acute  findings. Other: Bilateral carotid atherosclerosis. IMPRESSION: 1. Small acute subdural hemorrhage along the anterior aspects of the interhemispheric fissure, measuring 7 mm greatest thickness, without significant mass effect. No other intracranial hemorrhage. No skull fracture. 2. 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 Additional chronic small vessel ischemic changes throughout the white matter regions bilaterally. 3. Large soft tissue hematoma overlying the  LEFT lower frontal bone. No underlying fracture. 4. Slightly displaced fracture of the LEFT nasal bone, anterior aspect. Questionable minimally displaced fracture of the underlying anterior nasal septum. Associated fluid levels within the paranasal sinuses. 5. No other facial bone fracture or displacement seen. 6. No fracture or acute subluxation within the cervical spine. Anterior cervical fusion hardware within the lower cervical spine appears intact and appropriately positioned. These results were called by telephone at the time of interpretation on 09/18/2017 at 11:07 am to Dr. Isla Pence , who verbally acknowledged these results. Electronically Signed   By: Franki Cabot M.D.   On: 09/18/2017 11:09   Ct Maxillofacial Wo Contrast  Result Date: 09/18/2017 CLINICAL DATA:  Fall this morning, hematoma to forehead, bleeding from nose. EXAM: CT HEAD WITHOUT CONTRAST CT MAXILLOFACIAL WITHOUT CONTRAST CT CERVICAL SPINE WITHOUT CONTRAST TECHNIQUE: Multidetector CT imaging of the head, cervical spine, and maxillofacial structures were performed using the standard protocol without intravenous contrast. Multiplanar CT image reconstructions of the cervical spine and maxillofacial structures were also generated. COMPARISON:  Brain MRI dated 05/02/2017. FINDINGS: CT HEAD FINDINGS Brain: Small acute subdural hemorrhage located just to the LEFT of the interhemispheric fissure, at the upper margin of the anterior falx, measuring 7 mm thickness, without significant mass effect. No additional extra-axial hemorrhage. No parenchymal hemorrhage or evidence of parenchymal edema. Chronic small vessel ischemic changes are seen throughout the bilateral periventricular and subcortical white matter regions. 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 No hydrocephalus. Vascular: There are chronic calcified atherosclerotic changes of the large vessels at the skull base. No unexpected  hyperdense vessel. Skull: No skull fracture or displacement. Other: Large soft tissue hematoma overlying the lower LEFT frontal bone. No underlying fracture. Slightly displaced fracture at the anterior aspects of the LEFT nasal bone. CT MAXILLOFACIAL FINDINGS Osseous: Lower frontal bones are intact and normally aligned. Osseous structures about the orbits are intact and normally aligned bilaterally. Bilateral zygomatic arches and pterygoid plates are intact. No mandible fracture or displacement seen. A focal lucency within the anterior RIGHT mandible appears to have well corticated margins indicating vessel foramen, not appreciably changed compared to the earlier facial bone CT of 12/23/2008. Incidental note made of chronic degenerative change at the RIGHT TMJ with associated mild subluxation of the mandibular condyle. Slightly displaced fracture at the anterior aspects of the LEFT nasal bone. Questionable minimally displaced fracture at the upper margin of the nasal septum. Chronic rightward deviation of the nasal septum. Orbits: Orbital globes appear intact and symmetric in configuration. No retro-orbital hemorrhage or edema. Sinuses: Fluid levels within the bilateral maxillary sinuses, sphenoid sinus and ethmoid air cells Soft tissues: Prominent soft tissue edema/hematoma overlying the LEFT lower frontal bone, medial LEFT orbit and nasal bones. CT CERVICAL SPINE FINDINGS Alignment: No evidence of acute vertebral body subluxation. Skull base and vertebrae: No fracture line or displaced fracture fragment. Facet joints appear intact and normally aligned throughout. Anterior cervical fusion hardware appears intact and appropriately positioned at the C5 through C7 levels. Soft tissues and spinal canal: No prevertebral fluid or swelling. No visible  canal hematoma. Disc levels:  No significant central canal stenosis at any level. Upper chest: No acute findings. Other: Bilateral carotid atherosclerosis. IMPRESSION: 1.  Small acute subdural hemorrhage along the anterior aspects of the interhemispheric fissure, measuring 7 mm greatest thickness, without significant mass effect. No other intracranial hemorrhage. No skull fracture. 2. 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 Additional chronic small vessel ischemic changes throughout the white matter regions bilaterally. 3. Large soft tissue hematoma overlying the LEFT lower frontal bone. No underlying fracture. 4. Slightly displaced fracture of the LEFT nasal bone, anterior aspect. Questionable minimally displaced fracture of the underlying anterior nasal septum. Associated fluid levels within the paranasal sinuses. 5. No other facial bone fracture or displacement seen. 6. No fracture or acute subluxation within the cervical spine. Anterior cervical fusion hardware within the lower cervical spine appears intact and appropriately positioned. These results were called by telephone at the time of interpretation on 09/18/2017 at 11:07 am to Dr. Isla Pence , who verbally acknowledged these results. Electronically Signed   By: Franki Cabot M.D.   On: 09/18/2017 11:09       Subjective: Sore all over, but no chest pain or sob.   Discharge Exam: Vitals:   09/21/17 0435 09/21/17 0737  BP: (!) 139/57 (!) 158/64  Pulse: 71 70  Resp: 18 18  Temp: 98.7 F (37.1 C) 98.2 F (36.8 C)  SpO2: 93% 95%   Vitals:   09/20/17 2019 09/20/17 2357 09/21/17 0435 09/21/17 0737  BP: (!) 160/64 (!) 136/54 (!) 139/57 (!) 158/64  Pulse: 78 68 71 70  Resp: 16 16 18 18   Temp: 98.8 F (37.1 C) 98.2 F (36.8 C) 98.7 F (37.1 C) 98.2 F (36.8 C)  TempSrc: Oral Oral Oral Oral  SpO2: 93% 94% 93% 95%  Weight:      Height:        General: Pt is alert, awake, not in acute distress Cardiovascular: RRR, S1/S2 +, no rubs, no gallops Respiratory: CTA bilaterally, no wheezing, no rhonchi Abdominal: Soft, NT, ND, bowel sounds + Extremities: no edema, no  cyanosis    The results of significant diagnostics from this hospitalization (including imaging, microbiology, ancillary and laboratory) are listed below for reference.     Microbiology: No results found for this or any previous visit (from the past 240 hour(s)).   Labs: BNP (last 3 results) No results for input(s): BNP in the last 8760 hours. Basic Metabolic Panel: Recent Labs  Lab 09/18/17 1135 09/19/17 0502  NA 138 141  K 4.2 4.3  CL 108 110  CO2 22 22  GLUCOSE 117* 118*  BUN 36* 41*  CREATININE 1.44* 1.48*  CALCIUM 8.7* 8.5*   Liver Function Tests: Recent Labs  Lab 09/19/17 0502  AST 50*  ALT 40  ALKPHOS 89  BILITOT 1.0  PROT 5.3*  ALBUMIN 3.0*   No results for input(s): LIPASE, AMYLASE in the last 168 hours. No results for input(s): AMMONIA in the last 168 hours. CBC: Recent Labs  Lab 09/18/17 1135 09/19/17 0502 09/20/17 0548  WBC 10.9* 7.0 6.6  NEUTROABS 7.8*  --   --   HGB 8.9* 7.1* 7.9*  HCT 29.3* 22.9* 25.2*  MCV 91.0 91.2 89.4  PLT 200 161 173   Cardiac Enzymes: No results for input(s): CKTOTAL, CKMB, CKMBINDEX, TROPONINI in the last 168 hours. BNP: Invalid input(s): POCBNP CBG: No results for input(s): GLUCAP in the last 168 hours. D-Dimer No results for input(s): DDIMER in  the last 72 hours. Hgb A1c No results for input(s): HGBA1C in the last 72 hours. Lipid Profile No results for input(s): CHOL, HDL, LDLCALC, TRIG, CHOLHDL, LDLDIRECT in the last 72 hours. Thyroid function studies No results for input(s): TSH, T4TOTAL, T3FREE, THYROIDAB in the last 72 hours.  Invalid input(s): FREET3 Anemia work up No results for input(s): VITAMINB12, FOLATE, FERRITIN, TIBC, IRON, RETICCTPCT in the last 72 hours. Urinalysis    Component Value Date/Time   COLORURINE AMBER (A) 04/25/2016 1704   APPEARANCEUR HAZY (A) 04/25/2016 1704   LABSPEC 1.025 04/25/2016 1704   PHURINE 5.0 04/25/2016 1704   GLUCOSEU NEGATIVE 04/25/2016 1704   HGBUR NEGATIVE  04/25/2016 1704   BILIRUBINUR Negative 09/15/2016 1214   KETONESUR NEGATIVE 04/25/2016 1704   PROTEINUR 15 mg/dL 09/15/2016 1214   PROTEINUR 100 (A) 04/25/2016 1704   UROBILINOGEN 0.2 09/15/2016 1214   UROBILINOGEN 1.0 06/20/2014 1030   NITRITE Negative 09/15/2016 1214   NITRITE NEGATIVE 04/25/2016 1704   LEUKOCYTESUR Negative 09/15/2016 1214   Sepsis Labs Invalid input(s): PROCALCITONIN,  WBC,  LACTICIDVEN Microbiology No results found for this or any previous visit (from the past 240 hour(s)).   Time coordinating discharge: 35 minutes  SIGNED:   Hosie Poisson, MD  [facility] 09/21/2017, 10:39 AM Pager   If 7PM-7AM, please contact night-coverage [email or web address] Password [email or web address]

## 2017-09-21 NOTE — Care Management Note (Signed)
Case Management Note  Patient Details  Name: Valerie Ramirez MRN: [id number] Date of Birth: 04-08-1933  Subjective/Objective:   Pt admitted with subdural hematoma. She is from home alone.                 Action/Plan: Pt discharging to [facility] today. CM signing off.   Expected Discharge Date:  09/21/17               Expected Discharge Plan:  Skilled Nursing Facility  In-House Referral:  Clinical Social Work  Discharge planning Services     Post Acute Care Choice:    Choice offered to:     DME Arranged:    DME Agency:     HH Arranged:    [facility] Agency:     Status of Service:  Completed, signed off  If discussed at [employer], dates discussed:    Additional Comments:  Pollie Friar, RN 09/21/2017, 10:58 AM

## 2017-09-21 NOTE — Care Management Important Message (Signed)
Important Message  Patient Details  Name: Valerie Ramirez MRN: [id number] Date of Birth: 1932-10-21   Medicare Important Message Given:  Yes    Shaquitta Burbridge 09/21/2017, 1:14 PM

## 2017-09-21 NOTE — Clinical Social Work Placement (Signed)
Nurse to call report to [phone], Room 404B    CLINICAL SOCIAL WORK PLACEMENT  NOTE  Date:  09/21/2017  Patient Details  Name: Valerie Ramirez MRN: [id number] Date of Birth: 1932-05-01  Clinical Social Work is seeking post-discharge placement for this patient at the [place] level of care (*CSW will initial, date and re-position this form in  chart as items are completed):  Yes   Patient/family provided with [facility] Work Department's list of facilities offering this level of care within the geographic area requested by the patient (or if unable, by the patient's family).  Yes   Patient/family informed of their freedom to choose among providers that offer the needed level of care, that participate in Medicare, Medicaid or managed care program needed by the patient, have an available bed and are willing to accept the patient.  Yes   Patient/family informed of [facility]'s ownership interest in [facility] and [facility], as well as of the fact that they are under no obligation to receive care at these facilities.  PASRR submitted to EDS on       PASRR number received on       Existing PASRR number confirmed on 09/20/17     FL2 transmitted to all facilities in geographic area requested by pt/family on 09/20/17     FL2 transmitted to all facilities within larger geographic area on       Patient informed that his/her managed care company has contracts with or will negotiate with certain facilities, including the following:        Yes   Patient/family informed of bed offers received.  Patient chooses bed at [place] [place]     Physician recommends and patient chooses bed at      Patient to be transferred to [facility] on 09/21/17.  Patient to be transferred to facility by [facility]AR     Patient family notified on 09/21/17 of transfer.  Name of family member notified:        PHYSICIAN       Additional Comment:     _______________________________________________ Geralynn Ochs, LCSW 09/21/2017, 11:30 AM

## 2017-09-21 NOTE — Progress Notes (Signed)
Physical Therapy Treatment Patient Details Name: Valerie Ramirez MRN: [id number] DOB: 1933/01/05 Today's Date: 09/21/2017    History of Present Illness Patient is a pleasant 84 year old female with history of hypothyroidism, hypertension, hyperlipidemia, coronary artery disease who had a ground-level fall in her kitchen and fell face first onto the hard floor.  She came to the hospital with facial trauma, was found to have subdural hematoma and a nasal bone fracture with significant epistaxis.  PMH positive for HTN, cardiomyopathy, GERD,, endometrial CA.    PT Comments    Pt no longer reports dizziness during gait today, however, is limited in endurance and was reaching for stability with both hands (hand held assist and the hallway railing) for gait in the hallway today.  She remains appropriate for SNF level rehab before returning home alone.  PT to follow acutely until d/c confirmed.      Follow Up Recommendations  SNF;Supervision/Assistance - 24 hour     Equipment Recommendations  None recommended by PT    Recommendations for Other Services   NA     Precautions / Restrictions Precautions Precautions: Fall    Mobility  Bed Mobility Overal bed mobility: Needs Assistance Bed Mobility: Supine to Sit     Supine to sit: HOB elevated;Min assist     General bed mobility comments: Min hand held assist to pull up to sitting EOB.    Transfers Overall transfer level: Needs assistance Equipment used: 1 person hand held assist Transfers: Sit to/from Stand Sit to Stand: Min guard         General transfer comment: Min guard assist for safety and balance during transitions.   Ambulation/Gait Ambulation/Gait assistance: Min assist Ambulation Distance (Feet): 75 Feet Assistive device: 1 person hand held assist(and hallway railing) Gait Pattern/deviations: Step-through pattern;Staggering left;Staggering right     General Gait Details: Pt continues to have mildly staggering gait  pattern with poor endurance.  No reports of dizziness or spinning during gait today, just fatigue and unsteadiness requiring both hands to be supported for balance.  Pt prefers not to use a Rw.           Balance Overall balance assessment: Needs assistance Sitting-balance support: Feet supported;No upper extremity supported Sitting balance-Leahy Scale: Good     Standing balance support: Bilateral upper extremity supported Standing balance-Leahy Scale: Poor                              Cognition Arousal/Alertness: Awake/alert Behavior During Therapy: WFL for tasks assessed/performed Overall Cognitive Status: Within Functional Limits for tasks assessed                                           General Comments General comments (skin integrity, edema, etc.): Pt positioned in the recliner at the end of the session.       Pertinent Vitals/Pain Pain Assessment: Faces Faces Pain Scale: Hurts little more Pain Location: back of head and neck Pain Descriptors / Indicators: Aching Pain Intervention(s): Limited activity within patient's tolerance;Monitored during session;Premedicated before session;Repositioned           PT Goals (current goals can now be found in the care plan section) Acute Rehab PT Goals Patient Stated Goal: to go to rehab so that she can get strong enough to go home.  Progress towards PT goals: Progressing toward  goals    Frequency    Min 3X/week      PT Plan Current plan remains appropriate       AM-PAC PT "6 Clicks" Daily Activity  Outcome Measure  Difficulty turning over in bed (including adjusting bedclothes, sheets and blankets)?: A Little Difficulty moving from lying on back to sitting on the side of the bed? : Unable Difficulty sitting down on and standing up from a chair with arms (e.g., wheelchair, bedside commode, etc,.)?: Unable Help needed moving to and from a bed to chair (including a wheelchair)?: A  Little Help needed walking in hospital room?: A Little Help needed climbing 3-5 steps with a railing? : A Lot 6 Click Score: 13    End of Session   Activity Tolerance: Patient limited by fatigue Patient left: in chair;with call bell/phone within reach;with family/visitor present   PT Visit Diagnosis: Other abnormalities of gait and mobility (R26.89);Dizziness and giddiness (R42)     Time: 9892-1194 PT Time Calculation (min) (ACUTE ONLY): 10 min  Charges:  $Gait Training: 8-22 mins          Kevin Space B. [place], [place], DPT [phone]            09/21/2017, 1:11 PM

## 2017-09-21 NOTE — Progress Notes (Signed)
[place] study visit completed in person. Patient was in room [phone] for subdural hematoma from recent fall. She is recovering and awaiting discharge to SNF for rehab. Plavix and ASA are on hold for 2 weeks. She denies any chest pain. Next research required visit will be due 19/nov/2019-18-jan/2020 (EKG and exam will be required).

## 2017-09-22 ENCOUNTER — Telehealth: Payer: Self-pay | Admitting: *Deleted

## 2017-09-22 NOTE — Telephone Encounter (Signed)
Lm requesting return call to complete TCM and confirm hosp f/u appt  

## 2017-09-25 DIAGNOSIS — S065X0D Traumatic subdural hemorrhage without loss of consciousness, subsequent encounter: Secondary | ICD-10-CM | POA: Diagnosis not present

## 2017-09-25 DIAGNOSIS — I10 Essential (primary) hypertension: Secondary | ICD-10-CM | POA: Diagnosis not present

## 2017-09-25 DIAGNOSIS — M542 Cervicalgia: Secondary | ICD-10-CM | POA: Diagnosis not present

## 2017-09-26 NOTE — Telephone Encounter (Signed)
Valerie Ramirez  Can front office please contact this patient to set up a hospital follow up appointment?  Earl Lagos was unsuccessful in reaching the patient to complete the TCM.  We are out of TCM call window now, but patient still needs to be seen.   Thank you for your help with this.

## 2017-09-27 ENCOUNTER — Other Ambulatory Visit: Payer: Self-pay | Admitting: Neurosurgery

## 2017-09-27 DIAGNOSIS — S065XAA Traumatic subdural hemorrhage with loss of consciousness status unknown, initial encounter: Secondary | ICD-10-CM

## 2017-09-27 DIAGNOSIS — S065X9A Traumatic subdural hemorrhage with loss of consciousness of unspecified duration, initial encounter: Secondary | ICD-10-CM

## 2017-09-29 NOTE — Telephone Encounter (Signed)
I spoke with patient's daughter. She said pt is in rehab at [facility]. She is doing ok and will call to set up follow up when she is released.

## 2017-10-12 DIAGNOSIS — S065X0D Traumatic subdural hemorrhage without loss of consciousness, subsequent encounter: Secondary | ICD-10-CM | POA: Diagnosis not present

## 2017-10-12 DIAGNOSIS — S022XXD Fracture of nasal bones, subsequent encounter for fracture with routine healing: Secondary | ICD-10-CM | POA: Diagnosis not present

## 2017-10-13 ENCOUNTER — Ambulatory Visit
Admission: RE | Admit: 2017-10-13 | Discharge: 2017-10-13 | Disposition: A | Discharge status: Home / Self Care | Payer: Medicare Other | Source: Ambulatory Visit | Attending: Neurosurgery | Admitting: Neurosurgery

## 2017-10-13 DIAGNOSIS — I6200 Nontraumatic subdural hemorrhage, unspecified: Secondary | ICD-10-CM | POA: Diagnosis not present

## 2017-10-13 DIAGNOSIS — S065X0D Traumatic subdural hemorrhage without loss of consciousness, subsequent encounter: Secondary | ICD-10-CM | POA: Diagnosis not present

## 2017-10-13 DIAGNOSIS — S022XXD Fracture of nasal bones, subsequent encounter for fracture with routine healing: Secondary | ICD-10-CM | POA: Diagnosis not present

## 2017-10-13 DIAGNOSIS — S065XAA Traumatic subdural hemorrhage with loss of consciousness status unknown, initial encounter: Secondary | ICD-10-CM

## 2017-10-13 DIAGNOSIS — S065X9A Traumatic subdural hemorrhage with loss of consciousness of unspecified duration, initial encounter: Secondary | ICD-10-CM

## 2017-10-14 ENCOUNTER — Other Ambulatory Visit: Payer: Medicare Other

## 2017-10-14 ENCOUNTER — Telehealth: Payer: Self-pay | Admitting: Family Medicine

## 2017-10-14 ENCOUNTER — Telehealth: Payer: Self-pay | Admitting: Cardiology

## 2017-10-14 DIAGNOSIS — S022XXD Fracture of nasal bones, subsequent encounter for fracture with routine healing: Secondary | ICD-10-CM | POA: Diagnosis not present

## 2017-10-14 DIAGNOSIS — S065X0D Traumatic subdural hemorrhage without loss of consciousness, subsequent encounter: Secondary | ICD-10-CM | POA: Diagnosis not present

## 2017-10-14 NOTE — Telephone Encounter (Signed)
Copied from [facility] [id number]. Topic: General - Other >> Oct 14, 2017  1:38 PM Yvette Rack wrote: Reason for CRM: OT Tommi Emery from [facility] [phone] calling for verbal orders for  1 time a week for 1 week 0 times a week for 1 week 1 time a week for 1 week  \for ADL transfer exercise and DC when goals met

## 2017-10-14 NOTE — Telephone Encounter (Signed)
New message   Timmothy Sours from [facility] calling to confirm patient should be taking Coreg.  Timmothy Sours [phone]

## 2017-10-14 NOTE — Telephone Encounter (Signed)
Unable to reach Valerie Ramirez OT with [facility] by phone to verify 2nd order request 0 times a week for 1 week.

## 2017-10-14 NOTE — Telephone Encounter (Signed)
Returned call to Timmothy Sours, [place] with [facility]. He reports when patient got out of rehab @ [facility], he did not notice this on her med list. Patient has this medication at her house (coreg 25mg ) and has a typed print up that states 6.25mg  BID. Advised that there could have been changes per nursing home but we have not been updated on this.

## 2017-10-15 ENCOUNTER — Emergency Department (HOSPITAL_COMMUNITY): Payer: Medicare Other

## 2017-10-15 ENCOUNTER — Emergency Department (HOSPITAL_COMMUNITY)
Admission: EM | Admit: 2017-10-15 | Discharge: 2017-10-16 | Disposition: A | Discharge status: Home / Self Care | Payer: Medicare Other | Attending: Emergency Medicine | Admitting: Emergency Medicine

## 2017-10-15 ENCOUNTER — Encounter (HOSPITAL_COMMUNITY): Payer: Self-pay | Admitting: Emergency Medicine

## 2017-10-15 DIAGNOSIS — Z96653 Presence of artificial knee joint, bilateral: Secondary | ICD-10-CM | POA: Diagnosis not present

## 2017-10-15 DIAGNOSIS — N183 Chronic kidney disease, stage 3 (moderate): Secondary | ICD-10-CM | POA: Diagnosis not present

## 2017-10-15 DIAGNOSIS — I10 Essential (primary) hypertension: Secondary | ICD-10-CM | POA: Diagnosis not present

## 2017-10-15 DIAGNOSIS — R531 Weakness: Secondary | ICD-10-CM | POA: Diagnosis not present

## 2017-10-15 DIAGNOSIS — Z8542 Personal history of malignant neoplasm of other parts of uterus: Secondary | ICD-10-CM | POA: Insufficient documentation

## 2017-10-15 DIAGNOSIS — I129 Hypertensive chronic kidney disease with stage 1 through stage 4 chronic kidney disease, or unspecified chronic kidney disease: Secondary | ICD-10-CM | POA: Diagnosis not present

## 2017-10-15 DIAGNOSIS — Z7902 Long term (current) use of antithrombotics/antiplatelets: Secondary | ICD-10-CM | POA: Diagnosis not present

## 2017-10-15 DIAGNOSIS — E039 Hypothyroidism, unspecified: Secondary | ICD-10-CM | POA: Diagnosis not present

## 2017-10-15 DIAGNOSIS — R1030 Lower abdominal pain, unspecified: Secondary | ICD-10-CM | POA: Diagnosis not present

## 2017-10-15 DIAGNOSIS — R519 Headache, unspecified: Secondary | ICD-10-CM

## 2017-10-15 DIAGNOSIS — R42 Dizziness and giddiness: Secondary | ICD-10-CM | POA: Diagnosis not present

## 2017-10-15 DIAGNOSIS — W19XXXA Unspecified fall, initial encounter: Secondary | ICD-10-CM | POA: Diagnosis not present

## 2017-10-15 DIAGNOSIS — I6200 Nontraumatic subdural hemorrhage, unspecified: Secondary | ICD-10-CM | POA: Diagnosis not present

## 2017-10-15 DIAGNOSIS — R51 Headache: Secondary | ICD-10-CM | POA: Insufficient documentation

## 2017-10-15 DIAGNOSIS — Z79899 Other long term (current) drug therapy: Secondary | ICD-10-CM | POA: Diagnosis not present

## 2017-10-15 DIAGNOSIS — R1084 Generalized abdominal pain: Secondary | ICD-10-CM | POA: Diagnosis not present

## 2017-10-15 DIAGNOSIS — I2510 Atherosclerotic heart disease of native coronary artery without angina pectoris: Secondary | ICD-10-CM | POA: Diagnosis not present

## 2017-10-15 LAB — BASIC METABOLIC PANEL
Anion gap: 12 (ref 5–15)
BUN: 36 mg/dL — ABNORMAL HIGH (ref 8–23)
CO2: 19 mmol/L — ABNORMAL LOW (ref 22–32)
Calcium: 9 mg/dL (ref 8.9–10.3)
Chloride: 108 mmol/L (ref 98–111)
Creatinine, Ser: 1.53 mg/dL — ABNORMAL HIGH (ref 0.44–1.00)
GFR calc Af Amer: 35 mL/min — ABNORMAL LOW (ref >60)
GFR calc non Af Amer: 30 mL/min — ABNORMAL LOW (ref >60)
Glucose, Bld: 102 mg/dL — ABNORMAL HIGH (ref 70–99)
Potassium: 3.9 mmol/L (ref 3.5–5.1)
Sodium: 139 mmol/L (ref 135–145)

## 2017-10-15 LAB — CBC WITH DIFFERENTIAL/PLATELET
Abs Immature Granulocytes: 0 10*3/uL (ref 0.0–0.1)
Basophils Absolute: 0 10*3/uL (ref 0.0–0.1)
Basophils Relative: 1 %
Eosinophils Absolute: 0 10*3/uL (ref 0.0–0.7)
Eosinophils Relative: 0 %
HCT: 26.9 % — ABNORMAL LOW (ref 36.0–46.0)
Hemoglobin: 8.1 g/dL — ABNORMAL LOW (ref 12.0–15.0)
Immature Granulocytes: 0 %
Lymphocytes Relative: 26 %
Lymphs Abs: 1.9 10*3/uL (ref 0.7–4.0)
MCH: 27.8 pg (ref 26.0–34.0)
MCHC: 30.1 g/dL (ref 30.0–36.0)
MCV: 92.4 fL (ref 78.0–100.0)
Monocytes Absolute: 1 10*3/uL (ref 0.1–1.0)
Monocytes Relative: 14 %
Neutro Abs: 4.3 10*3/uL (ref 1.7–7.7)
Neutrophils Relative %: 59 %
Platelets: 237 10*3/uL (ref 150–400)
RBC: 2.91 MIL/uL — ABNORMAL LOW (ref 3.87–5.11)
RDW: 20.4 % — ABNORMAL HIGH (ref 11.5–15.5)
WBC: 7.3 10*3/uL (ref 4.0–10.5)

## 2017-10-15 LAB — TROPONIN I: Troponin I: <0.03 ng/mL (ref <0.03)

## 2017-10-15 NOTE — ED Notes (Signed)
Pt now complains of left sided chest pain, EKG completed and provider notified.

## 2017-10-15 NOTE — ED Notes (Signed)
ED Provider at bedside. 

## 2017-10-15 NOTE — ED Provider Notes (Signed)
[facility] EMERGENCY DEPARTMENT Provider Note   CSN: [id number] Arrival date & time: 10/15/17  2019     History   Chief Complaint Chief Complaint  Patient presents with  . Headache    HPI AWILDA COVIN is a 84 y.o. female.  HPI   84 year old female brought in for evaluation by request family.  Patient with a recent fall and small subdurals.  She went to [facility] rehab facility and then was discharged back to home on Tuesday.  Initially she seemed to be doing much better.  The past couple days though she has become less active.  Earlier today she began crying, complaining of a headache again and stating that she wanted to go see her husband who has died in the past year.  She is inconsolable so family brought her to the emergency room.  Past Medical History:  Diagnosis Date  . Arthritis    OA  . CAD (coronary artery disease)   . Cancer ([place])    endometrial  . GERD (gastroesophageal reflux disease)   . Hearing loss   . History of kidney stones   . Hyperlipidemia   . Hypertension   . Hypothyroid    "not in years"  . Shortness of breath dyspnea    04/25/17- not anymore    Patient Active Problem List   Diagnosis Date Noted  . Subdural hematoma ([place]) 09/18/2017  . CAD (coronary artery disease) 09/18/2017  . Closed fracture of nasal bones   . Epistaxis   . Elevated transaminase level 08/03/2017  . Radial artery injury, left, sequela 06/15/2017  . H/O: CVA (cerebrovascular accident) 05/26/2017  . Sick sinus syndrome ([place]) 05/25/2017  . Unstable angina ([place])   . Exertional chest pain 04/05/2017  . CKD (chronic kidney disease), stage III ([place]) 04/05/2017  . Normocytic normochromic anemia 04/05/2017  . Thoracic back pain 09/15/2016  . History of radius fracture 06/01/2016  . AKI (acute kidney injury) ([place]) 04/25/2016  . Status post cervical spinal fusion 08/18/2015  . Osteoarthritis, hip, bilateral 08/12/2014  . Hearing loss in right ear 06/12/2014    . Colon cancer screening 08/10/2013  . Seborrheic keratoses, inflamed 08/10/2013  . History of fall 04/10/2013  . Encounter for Medicare annual wellness exam 08/08/2012  . Gout 07/14/2012  . History of endometrial cancer 05/25/2012  . Degenerative joint disease of cervical spine 05/03/2011  . Other screening mammogram 03/31/2011  . Hyperglycemia 09/26/2007  . Morbid obesity ([place]) 07/12/2007  . Essential hypertension 07/11/2007  . History of cardiomyopathy 07/11/2007  . Osteoarthritis 07/11/2007  . 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 Hypothyroidism 10/05/2006  . HYPERCHOLESTEROLEMIA, PURE 10/05/2006    Past Surgical History:  Procedure Laterality Date  . ABDOMINAL HYSTERECTOMY    . ANTERIOR CERVICAL DECOMP/DISCECTOMY FUSION [name] 08/18/2015   Procedure: C5-6, C6-7 Anterior Cervical Discectomy and Fusion, Allograft, Plate;  Surgeon: Marybelle Killings, MD;  Location: [facility];  Service: Orthopedics;  Laterality: [name];  . bone spur removed Right    shoulder  . BOWEL RESECTION  07/04/2012   Procedure: SMALL BOWEL RESECTION;  Surgeon: Alvino Chapel, MD;  Location: [facility];  Service: Gynecology;;  . CHOLECYSTECTOMY    . CORONARY STENT INTERVENTION [name] 04/06/2017   Procedure: CORONARY STENT INTERVENTION;  Surgeon: Wellington Hampshire, MD;  Location: [facility] CV [facility];  Service: Cardiovascular;  Laterality: [name];  . DILATION AND CURETTAGE OF UTERUS  1999  . EYE SURGERY Bilateral    cataracts  .  FALSE ANEURYSM REPAIR Right 04/26/2017   Procedure: 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;  Surgeon: Conrad Flagler, MD;  Location: [facility];  Service: Vascular;  Laterality: Right;  . HAND SURGERY  12/2008   after hand fx/due to fall  . JOINT REPLACEMENT Bilateral 07/1998   knees  . KNEE ARTHROPLASTY  05/23/1998   total right  . LAPAROTOMY Bilateral 07/04/2012   Procedure: EXPLORATORY LAPAROTOMY TOTAL ABDOMINAL HYSTERECTOMY BILATERAL SALPINGO-OOPHORECTOMY with small bowel resection  ;  Surgeon: Alvino Chapel, MD;  Location: [facility];  Service: Gynecology;  Laterality: Bilateral;  . LEFT HEART CATH AND CORONARY ANGIOGRAPHY [name] 04/06/2017   Procedure: LEFT HEART CATH AND CORONARY ANGIOGRAPHY;  Surgeon: Jolaine Artist, MD;  Location: [facility];  Service: Cardiovascular;  Laterality: [name];  . polyp removal  1999  . TUBAL LIGATION       OB History    Gravida  5   Para  2   Term      Preterm      AB  3   Living  2     SAB  3   TAB      Ectopic      Multiple      Live Births               Home Medications    Prior to Admission medications   Medication Sig Start Date End Date Taking? Authorizing Provider  acetaminophen (TYLENOL) 650 MG CR tablet Take 650 mg by mouth every 8 (eight) hours as needed for pain.   Yes [provider]  amLODipine (NORVASC) 10 MG tablet Take 1 tablet (10 mg total) by mouth daily. 04/22/17  Yes Almyra Deforest, PA  carvedilol (COREG) 25 MG tablet Take 1 tablet (25 mg total) by mouth 2 (two) times daily with a meal. 04/09/17  Yes [name], [name], PA-C  clopidogrel (PLAVIX) 75 MG tablet Take 75 mg by mouth daily. 09/03/17  Yes [provider]  colchicine 0.6 MG tablet Take 1 tablet (0.6 mg total) by mouth 2 (two) times daily. 08/03/17  Yes Tower, Wynelle Fanny, MD  doxazosin (CARDURA) 2 MG tablet TAKE 1 TABLET (8 MG TOTAL) BY MOUTH AT BEDTIME. Patient taking differently: Take 2 mg by mouth at bedtime.  05/11/17  Yes [place], Peter M, MD  DULoxetine (CYMBALTA) 20 MG capsule TAKE 1 CAPSULE (20 MG TOTAL) BY MOUTH DAILY. 08/03/17  Yes Tower, Wynelle Fanny, MD  lisinopril (PRINIVIL,ZESTRIL) 5 MG tablet Take 1 tablet (5 mg total) by mouth daily. 08/15/17 08/10/18 Yes [place], Peter M, MD  loperamide (IMODIUM) 2 MG capsule Take 1 capsule (2 mg total) by mouth as needed for diarrhea or loose stools. 04/29/16  Yes Eugenie Filler, MD  losartan (COZAAR) 25 MG tablet Take 25 mg by mouth daily.   Yes [provider]   nitroGLYCERIN (NITROSTAT) 0.4 MG SL tablet Place 1 tablet (0.4 mg total) under the tongue every 5 (five) minutes x 3 doses as needed for chest pain. 04/09/17  Yes [name], [name], PA-C  polyethylene glycol (MIRALAX / GLYCOLAX) packet Take 17 g by mouth daily as needed for mild constipation. 09/21/17  Yes Hosie Poisson, MD  rosuvastatin (CRESTOR) 10 MG tablet Take 1 tablet (10 mg total) by mouth daily at 6 PM. 04/09/17  Yes [name], [name], PA-C  vitamin B-12 (CYANOCOBALAMIN) 1000 MCG tablet Take 1,000 mcg by mouth daily.   Yes [provider]  omega-3 acid ethyl  esters (LOVAZA) 1 g capsule Take 1 capsule (1 g total) by mouth 2 (two) times daily. 05/26/17   Almyra Deforest, PA    Family History Family History  Problem Relation Age of Onset  . Cancer Brother        LEUKEMIA  . Cancer Brother        LUNG  . Cancer Brother        STOMACH  . Heart disease Sister        CABG  . Stroke Sister   . Heart disease Brother        CABG    Social History Social History   Tobacco Use  . Smoking status: Never Smoker  . Smokeless tobacco: Never Used  Substance Use Topics  . Alcohol use: No    Alcohol/week: 0.0 oz  . Drug use: No     Allergies   Allopurinol; Lipitor [atorvastatin calcium]; Morphine and related; Simvastatin; and Tape   Review of Systems Review of Systems  All systems reviewed and negative, other than as noted in HPI.  Physical Exam Updated Vital Signs BP 139/64   Pulse 72   Temp 98 F (36.7 C) (Oral)   Resp 14   SpO2 99%   Physical Exam  Constitutional: She is oriented to person, place, and time. She appears well-developed and well-nourished. No distress.  HENT:  Head: Normocephalic and atraumatic.  Eyes: Conjunctivae are normal. Right eye exhibits no discharge. Left eye exhibits no discharge.  Neck: Neck supple.  Cardiovascular: Normal rate, regular rhythm and normal heart sounds. Exam reveals no gallop and no friction rub.  No murmur  heard. Pulmonary/Chest: Effort normal and breath sounds normal. No respiratory distress.  Abdominal: Soft. She exhibits no distension. There is no tenderness.  Musculoskeletal: She exhibits no edema or tenderness.  Neurological: She is alert and oriented to person, place, and time. She has normal strength. No cranial nerve deficit or sensory deficit.  Skin: Skin is warm and dry.  Psychiatric: Her behavior is normal. Thought content normal. Her mood appears anxious.  Nursing note and vitals reviewed.    ED Treatments / Results  Labs (all labs ordered are listed, but only abnormal results are displayed) Labs Reviewed  CBC WITH DIFFERENTIAL/PLATELET - Abnormal; Notable for the following components:      Result Value   RBC 2.91 (*)    Hemoglobin 8.1 (*)    HCT 26.9 (*)    RDW 20.4 (*)    All other components within normal limits  BASIC METABOLIC PANEL - Abnormal; Notable for the following components:   CO2 19 (*)    Glucose, Bld 102 (*)    BUN 36 (*)    Creatinine, Ser 1.53 (*)    GFR calc non Af Amer 30 (*)    GFR calc Af Amer 35 (*)    All other components within normal limits  TROPONIN I  URINALYSIS, ROUTINE W REFLEX MICROSCOPIC    EKG None  Radiology Ct Head Wo Contrast  Result Date: 10/15/2017 CLINICAL DATA:  84 year old female with altered mental status. Recent subdural hemorrhage. EXAM: CT HEAD WITHOUT CONTRAST TECHNIQUE: Contiguous axial images were obtained from the base of the skull through the vertex without intravenous contrast. COMPARISON:  Head CT dated 10/13/2017 FINDINGS: Brain: There is mild age-related atrophy and moderate chronic microvascular ischemic changes. An area of old infarct and encephalomalacia noted in the right occipital lobe. There is no acute intracranial hemorrhage. No mass effect or midline shift. No extra-axial  fluid collection. Interval resolution of the previously seen parafalcine subdural hemorrhage. Vascular: No hyperdense vessel or  unexpected calcification. Skull: Normal. Negative for fracture or focal lesion. Sinuses/Orbits: There is partial opacification of the right sphenoid sinus. The remainder of the visualized paranasal sinuses and mastoid air cells are clear. Other: None IMPRESSION: 1. No acute intracranial hemorrhage. Resolution of the previously seen parafalcine subdural blood. 2. Age-related atrophy and chronic microvascular ischemic changes. Old right occipital/PCA territory infarct. Electronically Signed   By: Anner Crete M.D.   On: 10/15/2017 22:49    Procedures Procedures (including critical care time)  Medications Ordered in ED Medications - No data to display   Initial Impression / Assessment and Plan / ED Course  I have reviewed the triage vital signs and the nursing notes.  Pertinent labs & imaging results that were available during my care of the patient were reviewed by me and considered in my medical decision making (see chart for details).    84 year old female with multiple complaints.  I suspect a large anxiety component.  On arrival she is very tearful and stated that she went to good see her husband who died within the past.  ED work-up fairly unremarkable.  She is now much calmer and more comfortable.  All her complaints are resolved and she states that she would like to go home.  CT the head does not show any acute abnormality.  Previously noted subdurals have resolved.  Urinalysis still pending.  I feel she is appropriate for discharge.  Antibiotics if it does appear she has UTI.  Final Clinical Impressions(s) / ED Diagnoses   Final diagnoses:  Nonintractable headache, unspecified chronicity pattern, unspecified headache type    ED Discharge Orders    None       Virgel Manifold, MD 10/15/17 2335
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
## 2017-10-15 NOTE — ED Notes (Signed)
Patient transported to CT 

## 2017-10-15 NOTE — ED Notes (Signed)
Pt refuses to allow RN to start IV and/or blood drawls.

## 2017-10-15 NOTE — ED Notes (Signed)
Prompted patient to provide urine sample. Pt states she wants to go home. Denies pain at this time. Family at bedside

## 2017-10-16 NOTE — ED Notes (Signed)
Patient left at this time with all belongings. 

## 2017-10-16 NOTE — Telephone Encounter (Signed)
I verbally ok the order- if you can ever get a hold of the person you need to

## 2017-10-16 NOTE — ED Notes (Signed)
Pt assisted up to bedside commode, had loose stool in urine sample. Notified Dr. Wilson Singer.

## 2017-10-17 DIAGNOSIS — S065X0D Traumatic subdural hemorrhage without loss of consciousness, subsequent encounter: Secondary | ICD-10-CM | POA: Diagnosis not present

## 2017-10-17 DIAGNOSIS — S022XXD Fracture of nasal bones, subsequent encounter for fracture with routine healing: Secondary | ICD-10-CM | POA: Diagnosis not present

## 2017-10-17 NOTE — Telephone Encounter (Signed)
Lm on Don's vm requesting a call back

## 2017-10-18 DIAGNOSIS — R51 Headache: Secondary | ICD-10-CM | POA: Diagnosis not present

## 2017-10-18 DIAGNOSIS — S065X9A Traumatic subdural hemorrhage with loss of consciousness of unspecified duration, initial encounter: Secondary | ICD-10-CM | POA: Diagnosis not present

## 2017-10-18 DIAGNOSIS — R42 Dizziness and giddiness: Secondary | ICD-10-CM | POA: Diagnosis not present

## 2017-10-23 ENCOUNTER — Other Ambulatory Visit: Payer: Self-pay | Admitting: Physician Assistant

## 2017-10-24 DIAGNOSIS — S022XXD Fracture of nasal bones, subsequent encounter for fracture with routine healing: Secondary | ICD-10-CM | POA: Diagnosis not present

## 2017-10-24 DIAGNOSIS — S065X0D Traumatic subdural hemorrhage without loss of consciousness, subsequent encounter: Secondary | ICD-10-CM | POA: Diagnosis not present

## 2017-10-26 ENCOUNTER — Inpatient Hospital Stay: Payer: Medicare Other | Admitting: Family Medicine

## 2017-10-26 ENCOUNTER — Encounter: Payer: Self-pay | Admitting: Family Medicine

## 2017-10-26 ENCOUNTER — Ambulatory Visit (INDEPENDENT_AMBULATORY_CARE_PROVIDER_SITE_OTHER): Payer: Medicare Other | Admitting: Family Medicine

## 2017-10-26 VITALS — BP 130/64 | HR 78 | Temp 98.4°F | Ht 62.0 in | Wt 202.8 lb

## 2017-10-26 DIAGNOSIS — N183 Chronic kidney disease, stage 3 unspecified: Secondary | ICD-10-CM

## 2017-10-26 DIAGNOSIS — Z8679 Personal history of other diseases of the circulatory system: Secondary | ICD-10-CM

## 2017-10-26 DIAGNOSIS — I10 Essential (primary) hypertension: Secondary | ICD-10-CM

## 2017-10-26 DIAGNOSIS — Z9181 History of falling: Secondary | ICD-10-CM | POA: Diagnosis not present

## 2017-10-26 DIAGNOSIS — F32A Depression, unspecified: Secondary | ICD-10-CM | POA: Insufficient documentation

## 2017-10-26 DIAGNOSIS — I2510 Atherosclerotic heart disease of native coronary artery without angina pectoris: Secondary | ICD-10-CM

## 2017-10-26 DIAGNOSIS — F329 Major depressive disorder, single episode, unspecified: Secondary | ICD-10-CM

## 2017-10-26 DIAGNOSIS — F0781 Postconcussional syndrome: Secondary | ICD-10-CM

## 2017-10-26 DIAGNOSIS — D649 Anemia, unspecified: Secondary | ICD-10-CM | POA: Diagnosis not present

## 2017-10-26 DIAGNOSIS — S022XXS Fracture of nasal bones, sequela: Secondary | ICD-10-CM | POA: Diagnosis not present

## 2017-10-26 MED ORDER — DULOXETINE HCL 30 MG PO CPEP: 30.0000 mg | ORAL_CAPSULE | Freq: Every day | ORAL | 3 refills | Status: DC

## 2017-10-26 NOTE — Patient Instructions (Addendum)
Do pick up any rugs you have left Keep working with PT  Get an emergency button as well   Brain rest is important for concussion symptoms  If symptoms get worse please let us know    Increase cymbalta to 30 mg daily  If no improvement or any side effects please alert [place]   Follow up in 3 months

## 2017-10-26 NOTE — Progress Notes (Signed)
Subjective:    Patient ID: Valerie Ramirez, female    DOB: 1933-04-19, 84 y.o.   MRN: [id number]  HPI Here for hosp f/u= ED on 6/29 F/u of subdural hemorrhages -continued headache  CT showed subdurals had resolved   Sustained subdural hem early June along with nasal fx after fall on face  Says she stubbed toe in kitchen - went forward   Did not end up needing surgery  Off plavix until 17th -now back on it   Still has a lot of dizziness  Esp if she goes from light to dark/ hot to hold or vice versa     Lab Results  Component Value Date   CREATININE 1.53 (H) 10/15/2017   BUN 36 (H) 10/15/2017   NA 139 10/15/2017   K 3.9 10/15/2017   CL 108 10/15/2017   CO2 19 (L) 10/15/2017   Lab Results  Component Value Date   WBC 7.3 10/15/2017   HGB 8.1 (L) 10/15/2017   HCT 26.9 (L) 10/15/2017   MCV 92.4 10/15/2017   PLT 237 10/15/2017     Could not get a urine specimen  No uti symptoms  Drinks a lot of water    CT report Ct Head Wo Contrast  Result Date: 10/15/2017 CLINICAL DATA:  83 year old female with altered mental status. Recent subdural hemorrhage. EXAM: CT HEAD WITHOUT CONTRAST TECHNIQUE: Contiguous axial images were obtained from the base of the skull through the vertex without intravenous contrast. COMPARISON:  Head CT dated 10/13/2017 FINDINGS: Brain: There is mild age-related atrophy and moderate chronic microvascular ischemic changes. An area of old infarct and encephalomalacia noted in the right occipital lobe. There is no acute intracranial hemorrhage. No mass effect or midline shift. No extra-axial fluid collection. Interval resolution of the previously seen parafalcine subdural hemorrhage. Vascular: No hyperdense vessel or unexpected calcification. Skull: Normal. Negative for fracture or focal lesion. Sinuses/Orbits: There is partial opacification of the right sphenoid sinus. The remainder of the visualized paranasal sinuses and mastoid air cells are clear. Other:  None IMPRESSION: 1. No acute intracranial hemorrhage. Resolution of the previously seen parafalcine subdural blood. 2. Age-related atrophy and chronic microvascular ischemic changes. Old right occipital/PCA territory infarct. Electronically Signed   By: Anner Crete M.D.   On: 10/15/2017 22:49  Review of Systems  Constitutional: Positive for fatigue. Negative for activity change, appetite change, fever and unexpected weight change.  HENT: Negative for congestion, ear pain, rhinorrhea, sinus pressure and sore throat.   Eyes: Negative for pain, redness and visual disturbance.  Respiratory: Negative for cough, shortness of breath and wheezing.   Cardiovascular: Negative for chest pain and palpitations.  Gastrointestinal: Negative for abdominal pain, blood in stool, constipation and diarrhea.  Endocrine: Negative for polydipsia and polyuria.  Genitourinary: Negative for dysuria, frequency and urgency.  Musculoskeletal: Negative for arthralgias, back pain and myalgias.  Skin: Negative for pallor and rash.  Allergic/Immunologic: Negative for environmental allergies.  Neurological: Positive for dizziness and headaches. Negative for tremors, seizures, syncope, facial asymmetry, speech difficulty, light-headedness and numbness.  Hematological: Negative for adenopathy. Does not bruise/bleed easily.  Psychiatric/Behavioral: Positive for decreased concentration and dysphoric mood. Negative for sleep disturbance and suicidal ideas. The patient is not nervous/anxious.        Wt Readings from Last 3 Encounters:  10/26/17 202 lb 12 oz (92 kg)  09/20/17 216 lb 7.9 oz (98.2 kg)  08/15/17 224 lb 3.2 oz (101.7 kg)   37.08 kg/m   BP Readings from Last  3 Encounters:  10/26/17 130/64  10/15/17 (!) 127/58  09/21/17 (!) 147/57   Pulse Readings from Last 3 Encounters:  10/26/17 78  10/15/17 71  09/21/17 68   Neuro surg f/u last week- Dr Sherwood Gambler  Noted her headaches/ dizziness- told it would take  months   Headache comes and goes - is worse today  Tends to take tylenol  Hurts in the back primarily  Is doing in home PT  Working on balance and strength   Has a walker and cane- uses them when she needs them  Now wearing shoes at home  Going to shorten long clothing  Now has walk in shower- grab bars / seat  Has one rug left to pick up   On cymbalta Dr Lorin [facility] gave it to her for her back pain  Wonders if a higher dose would help with mood and pain  Mood- has yucky days/ very sad at times (1 y since husband passed)  Had a day when she wanted to be in heaven with her husband  No side effects  Not interested in counseling Does pastoral 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 Depression 10/26/2017  . Subdural hematoma ([place]) 09/18/2017  . CAD (coronary artery disease) 09/18/2017  . Closed fracture of nasal bones   . Epistaxis   . Elevated transaminase level 08/03/2017  . Radial artery injury, left, sequela 06/15/2017  . H/O: CVA (cerebrovascular accident) 05/26/2017  . Sick sinus syndrome ([place]) 05/25/2017  . Unstable angina ([place])   . Exertional chest pain 04/05/2017  . CKD (chronic kidney disease), stage III ([place]) 04/05/2017  . Normocytic normochromic anemia 04/05/2017  . Thoracic back pain 09/15/2016  . History of radius fracture 06/01/2016  . AKI (acute kidney injury) ([place]) 04/25/2016  . Status post cervical spinal fusion 08/18/2015  . Osteoarthritis, hip, bilateral 08/12/2014  . Hearing loss in right ear 06/12/2014  . Colon cancer screening 08/10/2013  . Seborrheic keratoses, inflamed 08/10/2013  . History of fall 04/10/2013  . Encounter for Medicare annual wellness exam 08/08/2012  . Gout 07/14/2012  . History of endometrial cancer 05/25/2012  . Degenerative joint disease of cervical spine 05/03/2011  . Other screening mammogram 03/31/2011  . Hyperglycemia 09/26/2007  . Morbid obesity ([place]) 07/12/2007  .  Essential hypertension 07/11/2007  . History of cardiomyopathy 07/11/2007  . Osteoarthritis 07/11/2007  . 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 Hypothyroidism 10/05/2006  . HYPERCHOLESTEROLEMIA, PURE 10/05/2006   Past Medical History:  Diagnosis Date  . Arthritis    OA  . CAD (coronary artery disease)   . Cancer ([place])    endometrial  . GERD (gastroesophageal reflux disease)   . Hearing loss   . History of kidney stones   . Hyperlipidemia   . Hypertension   . Hypothyroid    "not in years"  . Shortness of breath dyspnea    04/25/17- not anymore   Past Surgical History:  Procedure Laterality Date  . ABDOMINAL HYSTERECTOMY    . ANTERIOR CERVICAL DECOMP/DISCECTOMY FUSION [name] 08/18/2015   Procedure: C5-6, C6-7 Anterior Cervical Discectomy and Fusion, Allograft, Plate;  Surgeon: Marybelle Killings, MD;  Location: [facility];  Service: Orthopedics;  Laterality: [name];  . bone spur removed Right    shoulder  . BOWEL RESECTION  07/04/2012   Procedure: SMALL BOWEL RESECTION;  Surgeon: Alvino Chapel, MD;  Location: [facility];  Service: Gynecology;;  . CHOLECYSTECTOMY    .  CORONARY STENT INTERVENTION [name] 04/06/2017   Procedure: CORONARY STENT INTERVENTION;  Surgeon: Wellington Hampshire, MD;  Location: [facility];  Service: Cardiovascular;  Laterality: [name];  . DILATION AND CURETTAGE OF UTERUS  1999  . EYE SURGERY Bilateral    cataracts  . FALSE ANEURYSM REPAIR Right 04/26/2017   Procedure: 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;  Surgeon: Conrad , MD;  Location: [facility];  Service: Vascular;  Laterality: Right;  . HAND SURGERY  12/2008   after hand fx/due to fall  . JOINT REPLACEMENT Bilateral 07/1998   knees  . KNEE ARTHROPLASTY  05/23/1998   total right  . LAPAROTOMY Bilateral 07/04/2012   Procedure: EXPLORATORY LAPAROTOMY TOTAL ABDOMINAL HYSTERECTOMY BILATERAL SALPINGO-OOPHORECTOMY with small bowel resection ;  Surgeon: Alvino Chapel, MD;  Location: [facility];  Service: Gynecology;  Laterality: Bilateral;  . LEFT HEART CATH AND CORONARY ANGIOGRAPHY [name] 04/06/2017   Procedure: LEFT HEART CATH AND CORONARY ANGIOGRAPHY;  Surgeon: Jolaine Artist, MD;  Location: [facility];  Service: Cardiovascular;  Laterality: [name];  . polyp removal  1999  . TUBAL LIGATION     Social History   Tobacco Use  . Smoking status: Never Smoker  . Smokeless tobacco: Never Used  Substance Use Topics  . Alcohol use: No    Alcohol/week: 0.0 oz  . Drug use: No   Family History  Problem Relation Age of Onset  . Cancer Brother        LEUKEMIA  . Cancer Brother        LUNG  . Cancer Brother        STOMACH  . Heart disease Sister        CABG  . Stroke Sister   . Heart disease Brother        CABG   Allergies  Allergen Reactions  . Allopurinol Nausea Only  . Lipitor [Atorvastatin Calcium] Other (See Comments)    Leg aches and aching all over  . Morphine And Related Other (See Comments)    Makes the patient "loopy,"   . Simvastatin Other (See Comments)    MUSCLE PAIN  . Tape Itching and Rash    "Cannot use paper tape" --- also causes blisters. Use plastic tape   Current Outpatient Medications on File Prior to Visit  Medication Sig Dispense Refill  . acetaminophen (TYLENOL) 650 MG CR tablet Take 650 mg by mouth every 8 (eight) hours as needed for pain.    [name] amLODipine (NORVASC) 10 MG tablet Take 1 tablet (10 mg total) by mouth daily. 30 tablet 11  . carvedilol (COREG) 25 MG tablet Take 1 tablet (25 mg total) by mouth 2 (two) times daily with a meal. 60 tablet 11  . clopidogrel (PLAVIX) 75 MG tablet Take 75 mg by mouth daily.    . colchicine 0.6 MG tablet Take 1 tablet (0.6 mg total) by mouth 2 (two) times daily. 60 tablet 11  . doxazosin (CARDURA) 2 MG tablet TAKE 1 TABLET (8 MG TOTAL) BY MOUTH AT BEDTIME. (Patient taking differently: Take 2 mg by mouth at bedtime. ) 30 tablet 5  . lisinopril (PRINIVIL,ZESTRIL) 5 MG tablet Take 1 tablet (5 mg  total) by mouth daily. 90 tablet 3  . loperamide (IMODIUM) 2 MG capsule Take 1 capsule (2 mg total) by mouth as needed for diarrhea or loose stools. 20 capsule 0  . losartan (COZAAR) 25 MG tablet Take 25 mg by mouth daily.    [name]  nitroGLYCERIN (NITROSTAT) 0.4 MG SL tablet Place 1 tablet (0.4 mg total) under the tongue every 5 (five) minutes x 3 doses as needed for chest pain. 25 tablet 12  . omega-3 acid ethyl esters (LOVAZA) 1 g capsule TAKE ONE CAPSULE BY MOUTH TWICE A DAY 60 capsule 3  . polyethylene glycol (MIRALAX / GLYCOLAX) packet Take 17 g by mouth daily as needed for mild constipation. 14 each 0  . rosuvastatin (CRESTOR) 10 MG tablet Take 1 tablet (10 mg total) by mouth daily at 6 PM. 30 tablet 11  . vitamin B-12 (CYANOCOBALAMIN) 1000 MCG tablet Take 1,000 mcg by mouth daily.     No current facility-administered medications on file prior to visit.       Objective:   Physical Exam        Assessment & Plan:   Problem List Items Addressed This Visit      Cardiovascular and Mediastinum   Essential hypertension    bp in fair control at this time  BP Readings from Last 1 Encounters:  10/26/17 130/64   No changes needed Most recent labs reviewed  Disc lifstyle change with low sodium diet and exercise  Labs reviewed         Nervous and Auditory   Post concussive syndrome - Primary    S/p fall/head inj/subdural hematoma with nasal fx Reviewed hospital records, lab results and studies in detail   Much improved-now with residual headache/ dizzines and trouble concentrating  Disc expectations for recovery  Handouts given-stressed imp of brain rest  Focus on rest when not doing PT incl rest from screens/reading etc  Update if no further imp       Relevant Medications   DULoxetine (CYMBALTA) 30 MG capsule     Musculoskeletal and Integument   Closed fracture of nasal bones    Much improved s/p hosp and ENT tx  Reviewed hospital records, lab results and studies in detail  breathing well/no further bleeding   Disc fall prev        Genitourinary   CKD (chronic kidney disease), stage III (H[facility])    Cr of 1.53 after hospitalization Disc imp of water intake  Continue renal f/u        Other   Depression    Worsened s/p hosp for fall with subdural hematoma and now post concussive sympt Reviewed stressors/ coping techniques/symptoms/ support sources/ tx options and side effects in detail today Will inc cymbalta to 30 mg and see if helpful  Can titrate further if needed Disc imp of self care       Relevant Medications   DULoxetine (CYMBALTA) 30 MG capsule   H/O subdural hemorrhage    Reviewed hospital records, lab results and studies in detail  Much improved - CT demonstrated resolution  Caused by fall (also on plavix)  Long discussion re: fall prevention  Also post concussion syndrome        History of fall    Resulting in nasal fx and subdural hematoma-now much improved Reviewed hospital records, lab results and studies in detail   Long disc re fall prevention  Continue home PT      Normocytic normochromic anemia    Stable during hosp for subdural hematoma

## 2017-10-27 DIAGNOSIS — S022XXD Fracture of nasal bones, subsequent encounter for fracture with routine healing: Secondary | ICD-10-CM | POA: Diagnosis not present

## 2017-10-27 DIAGNOSIS — S065X0D Traumatic subdural hemorrhage without loss of consciousness, subsequent encounter: Secondary | ICD-10-CM | POA: Diagnosis not present

## 2017-10-28 ENCOUNTER — Telehealth: Payer: Self-pay | Admitting: Family Medicine

## 2017-10-28 NOTE — Telephone Encounter (Signed)
Aware, thanks!

## 2017-10-28 NOTE — Telephone Encounter (Signed)
Copied from [place] [id number]. Topic: Quick Communication - See Telephone Encounter >> Oct 28, 2017  1:33 PM Synthia Innocent wrote: CRM for notification. See Telephone encounter for: 10/28/17. Per [facility] patient has declined any further OT. Discharging OT Please advise

## 2017-10-29 DIAGNOSIS — F0781 Postconcussional syndrome: Secondary | ICD-10-CM | POA: Insufficient documentation

## 2017-10-29 NOTE — Assessment & Plan Note (Signed)
Cr of 1.53 after hospitalization Disc imp of water intake  Continue renal f/u

## 2017-10-29 NOTE — Assessment & Plan Note (Signed)
S/p fall/head inj/subdural hematoma with nasal fx Reviewed hospital records, lab results and studies in detail   Much improved-now with residual headache/ dizzines and trouble concentrating  Disc expectations for recovery  Handouts given-stressed imp of brain rest  Focus on rest when not doing PT incl rest from screens/reading etc  Update if no further imp

## 2017-10-29 NOTE — Assessment & Plan Note (Signed)
bp in fair control at this time  BP Readings from Last 1 Encounters:  10/26/17 130/64   No changes needed Most recent labs reviewed  Disc lifstyle change with low sodium diet and exercise  Labs reviewed

## 2017-10-29 NOTE — Assessment & Plan Note (Signed)
Resulting in nasal fx and subdural hematoma-now much improved Reviewed hospital records, lab results and studies in detail   Long disc re fall prevention  Continue home PT

## 2017-10-29 NOTE — Assessment & Plan Note (Signed)
Worsened s/p hosp for fall with subdural hematoma and now post concussive sympt Reviewed stressors/ coping techniques/symptoms/ support sources/ tx options and side effects in detail today Will inc cymbalta to 30 mg and see if helpful  Can titrate further if needed Disc imp of self care

## 2017-10-29 NOTE — Assessment & Plan Note (Signed)
Reviewed hospital records, lab results and studies in detail  Much improved - CT demonstrated resolution  Caused by fall (also on plavix)  Long discussion re: fall prevention  Also post concussion syndrome

## 2017-10-29 NOTE — Assessment & Plan Note (Signed)
Much improved s/p hosp and ENT tx  Reviewed hospital records, lab results and studies in detail breathing well/no further bleeding   Disc fall prev

## 2017-10-29 NOTE — Assessment & Plan Note (Signed)
Stable during hosp for subdural hematoma

## 2017-10-31 DIAGNOSIS — S022XXD Fracture of nasal bones, subsequent encounter for fracture with routine healing: Secondary | ICD-10-CM | POA: Diagnosis not present

## 2017-10-31 DIAGNOSIS — S065X0D Traumatic subdural hemorrhage without loss of consciousness, subsequent encounter: Secondary | ICD-10-CM | POA: Diagnosis not present

## 2017-11-07 DIAGNOSIS — S065X0D Traumatic subdural hemorrhage without loss of consciousness, subsequent encounter: Secondary | ICD-10-CM | POA: Diagnosis not present

## 2017-11-07 DIAGNOSIS — S022XXD Fracture of nasal bones, subsequent encounter for fracture with routine healing: Secondary | ICD-10-CM | POA: Diagnosis not present

## 2017-11-11 ENCOUNTER — Other Ambulatory Visit: Payer: Self-pay | Admitting: Cardiology

## 2017-11-11 NOTE — Telephone Encounter (Signed)
Rx request sent to pharmacy.  

## 2017-12-19 ENCOUNTER — Emergency Department (HOSPITAL_COMMUNITY)
Admission: EM | Admit: 2017-12-19 | Discharge: 2017-12-19 | Disposition: A | Discharge status: Home / Self Care | Payer: Medicare Other | Attending: Emergency Medicine | Admitting: Emergency Medicine

## 2017-12-19 ENCOUNTER — Emergency Department (HOSPITAL_COMMUNITY): Payer: Medicare Other

## 2017-12-19 ENCOUNTER — Other Ambulatory Visit: Payer: Self-pay

## 2017-12-19 DIAGNOSIS — E785 Hyperlipidemia, unspecified: Secondary | ICD-10-CM | POA: Diagnosis not present

## 2017-12-19 DIAGNOSIS — R296 Repeated falls: Secondary | ICD-10-CM | POA: Diagnosis not present

## 2017-12-19 DIAGNOSIS — I129 Hypertensive chronic kidney disease with stage 1 through stage 4 chronic kidney disease, or unspecified chronic kidney disease: Secondary | ICD-10-CM | POA: Insufficient documentation

## 2017-12-19 DIAGNOSIS — E039 Hypothyroidism, unspecified: Secondary | ICD-10-CM | POA: Insufficient documentation

## 2017-12-19 DIAGNOSIS — Z9181 History of falling: Secondary | ICD-10-CM | POA: Diagnosis not present

## 2017-12-19 DIAGNOSIS — Z79899 Other long term (current) drug therapy: Secondary | ICD-10-CM | POA: Insufficient documentation

## 2017-12-19 DIAGNOSIS — R0781 Pleurodynia: Secondary | ICD-10-CM | POA: Diagnosis not present

## 2017-12-19 DIAGNOSIS — N390 Urinary tract infection, site not specified: Secondary | ICD-10-CM | POA: Diagnosis not present

## 2017-12-19 DIAGNOSIS — S0990XA Unspecified injury of head, initial encounter: Secondary | ICD-10-CM | POA: Diagnosis not present

## 2017-12-19 DIAGNOSIS — R531 Weakness: Secondary | ICD-10-CM | POA: Diagnosis not present

## 2017-12-19 DIAGNOSIS — W19XXXA Unspecified fall, initial encounter: Secondary | ICD-10-CM

## 2017-12-19 DIAGNOSIS — I2510 Atherosclerotic heart disease of native coronary artery without angina pectoris: Secondary | ICD-10-CM | POA: Diagnosis not present

## 2017-12-19 DIAGNOSIS — N183 Chronic kidney disease, stage 3 (moderate): Secondary | ICD-10-CM | POA: Diagnosis not present

## 2017-12-19 DIAGNOSIS — S299XXA Unspecified injury of thorax, initial encounter: Secondary | ICD-10-CM | POA: Diagnosis not present

## 2017-12-19 DIAGNOSIS — R52 Pain, unspecified: Secondary | ICD-10-CM | POA: Diagnosis not present

## 2017-12-19 LAB — URINALYSIS, ROUTINE W REFLEX MICROSCOPIC
Bilirubin Urine: NEGATIVE
Glucose, UA: NEGATIVE mg/dL
Ketones, ur: NEGATIVE mg/dL
Nitrite: POSITIVE — AB
Protein, ur: NEGATIVE mg/dL
Specific Gravity, Urine: 1.011 (ref 1.005–1.030)
pH: 5 (ref 5.0–8.0)

## 2017-12-19 LAB — CBC WITH DIFFERENTIAL/PLATELET
Abs Immature Granulocytes: 0 10*3/uL (ref 0.0–0.1)
Basophils Absolute: 0 10*3/uL (ref 0.0–0.1)
Basophils Relative: 1 %
Eosinophils Absolute: 0.1 10*3/uL (ref 0.0–0.7)
Eosinophils Relative: 2 %
HCT: 28.2 % — ABNORMAL LOW (ref 36.0–46.0)
Hemoglobin: 8.5 g/dL — ABNORMAL LOW (ref 12.0–15.0)
Immature Granulocytes: 0 %
Lymphocytes Relative: 25 %
Lymphs Abs: 1.2 10*3/uL (ref 0.7–4.0)
MCH: 28.3 pg (ref 26.0–34.0)
MCHC: 30.1 g/dL (ref 30.0–36.0)
MCV: 94 fL (ref 78.0–100.0)
Monocytes Absolute: 1.1 10*3/uL — ABNORMAL HIGH (ref 0.1–1.0)
Monocytes Relative: 24 %
Neutro Abs: 2.3 10*3/uL (ref 1.7–7.7)
Neutrophils Relative %: 48 %
Platelets: 217 10*3/uL (ref 150–400)
RBC: 3 MIL/uL — ABNORMAL LOW (ref 3.87–5.11)
RDW: 20.6 % — ABNORMAL HIGH (ref 11.5–15.5)
WBC: 4.8 10*3/uL (ref 4.0–10.5)

## 2017-12-19 LAB — COMPREHENSIVE METABOLIC PANEL
ALT: 41 U/L (ref 0–44)
AST: 48 U/L — ABNORMAL HIGH (ref 15–41)
Albumin: 3.4 g/dL — ABNORMAL LOW (ref 3.5–5.0)
Alkaline Phosphatase: 136 U/L — ABNORMAL HIGH (ref 38–126)
Anion gap: 11 (ref 5–15)
BUN: 34 mg/dL — ABNORMAL HIGH (ref 8–23)
CO2: 17 mmol/L — ABNORMAL LOW (ref 22–32)
Calcium: 8.7 mg/dL — ABNORMAL LOW (ref 8.9–10.3)
Chloride: 109 mmol/L (ref 98–111)
Creatinine, Ser: 1.79 mg/dL — ABNORMAL HIGH (ref 0.44–1.00)
GFR calc Af Amer: 29 mL/min — ABNORMAL LOW (ref >60)
GFR calc non Af Amer: 25 mL/min — ABNORMAL LOW (ref >60)
Glucose, Bld: 96 mg/dL (ref 70–99)
Potassium: 3.8 mmol/L (ref 3.5–5.1)
Sodium: 137 mmol/L (ref 135–145)
Total Bilirubin: 1.1 mg/dL (ref 0.3–1.2)
Total Protein: 6.4 g/dL — ABNORMAL LOW (ref 6.5–8.1)

## 2017-12-19 LAB — TROPONIN I: Troponin I: <0.03 ng/mL (ref <0.03)

## 2017-12-19 MED ORDER — CEPHALEXIN 250 MG PO CAPS
250.0000 mg | ORAL_CAPSULE | Freq: Once | ORAL | Status: AC
  Administered 2017-12-19: 250 mg via ORAL
  Filled 2017-12-19: qty 1

## 2017-12-19 MED ORDER — SODIUM CHLORIDE 0.9 % IV BOLUS
1000.0000 mL | Freq: Once | INTRAVENOUS | Status: AC
  Administered 2017-12-19: 1000 mL via INTRAVENOUS

## 2017-12-19 MED ORDER — CEPHALEXIN 250 MG PO CAPS: 500.0000 mg | ORAL_CAPSULE | Freq: Once | ORAL | Status: DC

## 2017-12-19 MED ORDER — CEPHALEXIN 250 MG PO CAPS: 250.0000 mg | ORAL_CAPSULE | Freq: Two times a day (BID) | ORAL | 0 refills | Status: AC

## 2017-12-19 MED ORDER — TRAMADOL HCL 50 MG PO TABS
50.0000 mg | ORAL_TABLET | Freq: Once | ORAL | Status: AC
  Administered 2017-12-19: 50 mg via ORAL
  Filled 2017-12-19: qty 1

## 2017-12-19 NOTE — Discharge Instructions (Addendum)
If you develop fever, vomiting, worsening weakness, unilateral or one-sided weakness, confusion, headache, or any other new/concerning symptoms or return to the ER for evaluation.  Otherwise follow-up with your primary care physician.

## 2017-12-19 NOTE — ED Triage Notes (Signed)
Pt arrives from home, generalized weakness and fatigue for one week, following first fall; has fallen three times since then, evalauted by EMS but refused hospital care. Pt felt "too weak to put up a fight" today. Also complains of CP radiating from Right to left, back to front, worsens with sitting up. Pt Aox4 on arrival

## 2017-12-19 NOTE — ED Provider Notes (Signed)
[facility] EMERGENCY DEPARTMENT Provider Note   CSN: [id number] Arrival date & time: 12/19/17  1549     History   Chief Complaint Chief Complaint  Patient presents with  . Chest Pain  . Fatigue    HPI Valerie Ramirez is a 84 y.o. female.  HPI  84 year old female presents with multiple falls, generalized weakness, and chest wall pain.  She has fallen multiple times, most recently about 6 days ago when she bent over, became dizzy, and then fell backwards when trying to stand back up.  She has diffuse lower chest wall and thoracic back pain.  Some pain with breathing and coughing.  This is been continuous since 6 days ago.  She did have a little bit of a slip 4 days ago but did not really injure herself then.  She has had a couple falls over the last week and a half where she has hit her head.  She is back on Plavix after having a subdural hematoma from a fall on Plavix.  She is been feeling generally weak and dizzy.  This is been ongoing since the subdural but seems to be worse over the last week or 2.  Has had poor appetite and not eating as much.  Felt nauseated after lunch yesterday and has not eaten since.  No urinary symptoms.  No unilateral weakness.  She has a walker but has been using a cane mostly.  These falls have been with a cane and not with a walker. Has been taking tramadol for pain, most recently 20 AM.  Past Medical History:  Diagnosis Date  . Arthritis    OA  . CAD (coronary artery disease)   . Cancer ([place])    endometrial  . GERD (gastroesophageal reflux disease)   . Hearing loss   . History of kidney stones   . Hyperlipidemia   . Hypertension   . Hypothyroid    "not in years"  . Shortness of breath dyspnea    04/25/17- not anymore    Patient Active Problem List   Diagnosis Date Noted  . Post concussive syndrome 10/29/2017  . Depression 10/26/2017  . H/O subdural hemorrhage 09/18/2017  . CAD (coronary artery disease) 09/18/2017  . Closed  fracture of nasal bones   . Epistaxis   . Elevated transaminase level 08/03/2017  . Radial artery injury, left, sequela 06/15/2017  . H/O: CVA (cerebrovascular accident) 05/26/2017  . Sick sinus syndrome ([place]) 05/25/2017  . Unstable angina ([place])   . Exertional chest pain 04/05/2017  . CKD (chronic kidney disease), stage III ([place]) 04/05/2017  . Normocytic normochromic anemia 04/05/2017  . Thoracic back pain 09/15/2016  . History of radius fracture 06/01/2016  . AKI (acute kidney injury) ([place]) 04/25/2016  . Status post cervical spinal fusion 08/18/2015  . Osteoarthritis, hip, bilateral 08/12/2014  . Hearing loss in right ear 06/12/2014  . Colon cancer screening 08/10/2013  . Seborrheic keratoses, inflamed 08/10/2013  . History of fall 04/10/2013  . Encounter for Medicare annual wellness exam 08/08/2012  . Gout 07/14/2012  . History of endometrial cancer 05/25/2012  . Degenerative joint disease of cervical spine 05/03/2011  . Other screening mammogram 03/31/2011  . Hyperglycemia 09/26/2007  . Morbid obesity ([place]) 07/12/2007  . Essential hypertension 07/11/2007  . History of cardiomyopathy 07/11/2007  . Osteoarthritis 07/11/2007  . 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 Hypothyroidism 10/05/2006  . HYPERCHOLESTEROLEMIA, PURE 10/05/2006    Past Surgical History:  Procedure Laterality Date  .  ABDOMINAL HYSTERECTOMY    . ANTERIOR CERVICAL DECOMP/DISCECTOMY FUSION [name] 08/18/2015   Procedure: C5-6, C6-7 Anterior Cervical Discectomy and Fusion, Allograft, Plate;  Surgeon: Marybelle Killings, MD;  Location: [facility];  Service: Orthopedics;  Laterality: [name];  . bone spur removed Right    shoulder  . BOWEL RESECTION  07/04/2012   Procedure: SMALL BOWEL RESECTION;  Surgeon: Alvino Chapel, MD;  Location: [facility];  Service: Gynecology;;  . CHOLECYSTECTOMY    . CORONARY STENT INTERVENTION [name] 04/06/2017   Procedure: CORONARY STENT INTERVENTION;  Surgeon: Wellington Hampshire, MD;   Location: [facility];  Service: Cardiovascular;  Laterality: [name];  . DILATION AND CURETTAGE OF UTERUS  1999  . EYE SURGERY Bilateral    cataracts  . FALSE ANEURYSM REPAIR Right 04/26/2017   Procedure: 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;  Surgeon: Conrad Marengo, MD;  Location: [facility];  Service: Vascular;  Laterality: Right;  . HAND SURGERY  12/2008   after hand fx/due to fall  . JOINT REPLACEMENT Bilateral 07/1998   knees  . KNEE ARTHROPLASTY  05/23/1998   total right  . LAPAROTOMY Bilateral 07/04/2012   Procedure: EXPLORATORY LAPAROTOMY TOTAL ABDOMINAL HYSTERECTOMY BILATERAL SALPINGO-OOPHORECTOMY with small bowel resection ;  Surgeon: Alvino Chapel, MD;  Location: [facility];  Service: Gynecology;  Laterality: Bilateral;  . LEFT HEART CATH AND CORONARY ANGIOGRAPHY [name] 04/06/2017   Procedure: LEFT HEART CATH AND CORONARY ANGIOGRAPHY;  Surgeon: Jolaine Artist, MD;  Location: [facility];  Service: Cardiovascular;  Laterality: [name];  . polyp removal  1999  . TUBAL LIGATION       OB History    Gravida  5   Para  2   Term      Preterm      AB  3   Living  2     SAB  3   TAB      Ectopic      Multiple      Live Births               Home Medications    Prior to Admission medications   Medication Sig Start Date End Date Taking? Authorizing Provider  acetaminophen (TYLENOL) 650 MG CR tablet Take 650 mg by mouth every 8 (eight) hours as needed for pain.    [provider]  amLODipine (NORVASC) 10 MG tablet Take 1 tablet (10 mg total) by mouth daily. 04/22/17   Almyra Deforest, PA  carvedilol (COREG) 25 MG tablet Take 1 tablet (25 mg total) by mouth 2 (two) times daily with a meal. 04/09/17   [name], [name], PA-C  cephALEXin (KEFLEX) 250 MG capsule Take 1 capsule (250 mg total) by mouth 2 (two) times daily for 5 days. 12/19/17 12/24/17  Sherwood Gambler, MD  clopidogrel (PLAVIX) 75 MG tablet Take 75 mg by mouth daily. 09/03/17   [provider]  colchicine 0.6 MG tablet Take 1 tablet (0.6 mg total) by mouth 2 (two) times daily. 08/03/17   Tower, Wynelle Fanny, MD  doxazosin (CARDURA) 2 MG tablet Take 1 tablet (2 mg total) by mouth at bedtime. 11/11/17   Martinique, Peter M, MD  DULoxetine (CYMBALTA) 30 MG capsule Take 1 capsule (30 mg total) by mouth daily. 10/26/17   Tower, Wynelle Fanny, MD  lisinopril (PRINIVIL,ZESTRIL) 5 MG tablet Take 1 tablet (5 mg total) by mouth daily. 08/15/17 08/10/18  Martinique, Peter M, MD  loperamide (IMODIUM) 2 MG capsule  Take 1 capsule (2 mg total) by mouth as needed for diarrhea or loose stools. 04/29/16   Eugenie Filler, MD  losartan (COZAAR) 25 MG tablet Take 25 mg by mouth daily.    [provider]  nitroGLYCERIN (NITROSTAT) 0.4 MG SL tablet Place 1 tablet (0.4 mg total) under the tongue every 5 (five) minutes x 3 doses as needed for chest pain. 04/09/17   [name], [name], PA-C  omega-3 acid ethyl esters (LOVAZA) 1 g capsule TAKE ONE CAPSULE BY MOUTH TWICE A DAY 10/24/17   Martinique, Peter M, MD  polyethylene glycol [facility] / Floria Raveling) packet Take 17 g by mouth daily as needed for mild constipation. 09/21/17   Hosie Poisson, MD  rosuvastatin (CRESTOR) 10 MG tablet Take 1 tablet (10 mg total) by mouth daily at 6 PM. 04/09/17   [name], [name], PA-C  vitamin B-12 (CYANOCOBALAMIN) 1000 MCG tablet Take 1,000 mcg by mouth daily.    [provider]    Family History Family History  Problem Relation Age of Onset  . Cancer Brother        LEUKEMIA  . Cancer Brother        LUNG  . Cancer Brother        STOMACH  . Heart disease Sister        CABG  . Stroke Sister   . Heart disease Brother        CABG    Social History Social History   Tobacco Use  . Smoking status: Never Smoker  . Smokeless tobacco: Never Used  Substance Use Topics  . Alcohol use: No    Alcohol/week: 0.0 standard drinks  . Drug use: No     Allergies   Allopurinol; Lipitor [atorvastatin calcium]; Morphine and  related; Simvastatin; and Tape   Review of Systems Review of Systems  Constitutional: Positive for fatigue.  Respiratory: Negative for shortness of breath.   Cardiovascular: Positive for chest pain (since fall).  Gastrointestinal: Positive for nausea. Negative for abdominal pain and vomiting.  Genitourinary: Negative for dysuria.  Musculoskeletal: Positive for back pain.  Neurological: Positive for dizziness (with change in position) and weakness. Negative for headaches.  All other systems reviewed and are negative.    Physical Exam Updated Vital Signs BP 116/68 (BP Location: Left Arm)   Pulse 74   Temp 98 F (36.7 C) (Oral)   Resp 13   Ht 5\' 2"  (1.575 m)   Wt 93 kg   SpO2 100%   BMI 37.49 kg/m   Physical Exam  Constitutional: She is oriented to person, place, and time. She appears well-developed and well-nourished.  Non-toxic appearance. She does not appear ill. No distress.  HENT:  Head: Normocephalic and atraumatic.  Right Ear: External ear normal.  Left Ear: External ear normal.  Nose: Nose normal.  Eyes: Right eye exhibits no discharge. Left eye exhibits no discharge.  Cardiovascular: Normal rate, regular rhythm and normal heart sounds.  Pulmonary/Chest: Effort normal and breath sounds normal. She exhibits tenderness.    Abdominal: Soft. There is no tenderness.  Musculoskeletal:       Thoracic back: She exhibits tenderness.       Back:  Neurological: She is alert and oriented to person, place, and time.  CN 3-12 grossly intact. 5/5 strength in BUE. 4/5 strength in BLE. Grossly normal sensation.   Skin: Skin is warm and dry.  Nursing note and vitals reviewed.    ED Treatments / Results  Labs (all labs  ordered are listed, but only abnormal results are displayed) Labs Reviewed  URINALYSIS, ROUTINE W REFLEX MICROSCOPIC - Abnormal; Notable for the following components:      Result Value   APPearance HAZY (*)    Hgb urine dipstick SMALL (*)    Nitrite  POSITIVE (*)    Leukocytes, UA SMALL (*)    Bacteria, UA MANY (*)    All other components within normal limits  COMPREHENSIVE METABOLIC PANEL - Abnormal; Notable for the following components:   CO2 17 (*)    BUN 34 (*)    Creatinine, Ser 1.79 (*)    Calcium 8.7 (*)    Total Protein 6.4 (*)    Albumin 3.4 (*)    AST 48 (*)    Alkaline Phosphatase 136 (*)    GFR calc non Af Amer 25 (*)    GFR calc Af Amer 29 (*)    All other components within normal limits  CBC WITH DIFFERENTIAL/PLATELET - Abnormal; Notable for the following components:   RBC 3.00 (*)    Hemoglobin 8.5 (*)    HCT 28.2 (*)    RDW 20.6 (*)    Monocytes Absolute 1.1 (*)    All other components within normal limits  URINE CULTURE  TROPONIN I    EKG EKG Interpretation  Date/Time:  Monday December 19 2017 15:50:23 EDT Ventricular Rate:  74 PR Interval:    QRS Duration: 95 QT Interval:  437 QTC Calculation: 485 R Axis:   33 Text Interpretation:  Sinus rhythm Borderline prolonged PR interval Abnormal R-wave progression, early transition Borderline repolarization abnormality no significant change since Jun 2019 Confirmed by Sherwood Gambler [phone]) on 12/19/2017 3:54:45 PM   Radiology Dg Ribs Bilateral W/chest  Result Date: 12/19/2017 CLINICAL DATA:  Rib pain.  RIGHT-sided rib pain.  Fall EXAM: BILATERAL RIBS AND CHEST - 4+ VIEW COMPARISON:  None. FINDINGS: Normal cardiac silhouette.  No pulmonary contusion or pleural fluid. Dedicated views of the RIGHT ribs demonstrate no displaced fracture. Dedicated views of the LEFT lower ribs demonstrate no displaced fracture. IMPRESSION: 1. No evidence of thoracic trauma. 2. No evidence rib fracture. Electronically Signed   By: Suzy Bouchard M.D.   On: 12/19/2017 17:21   Ct Head Wo Contrast  Result Date: 12/19/2017 CLINICAL DATA:  Multiple falls EXAM: CT HEAD WITHOUT CONTRAST TECHNIQUE: Contiguous axial images were obtained from the base of the skull through the vertex  without intravenous contrast. COMPARISON:  PET-CT 10/15/2017 FINDINGS: Brain: Remote infarction in the medial RIGHT occipital lobe again demonstrated. Extensive periventricular subcortical white matter hypodensities. No acute intracranial hemorrhage. No focal mass lesion. No CT evidence of acute infarction. No midline shift or mass effect. No hydrocephalus. Basilar cisterns are patent. Vascular: No hyperdense vessel or unexpected calcification. Skull: Normal. Negative for fracture or focal lesion. Sinuses/Orbits: Paranasal sinuses and mastoid air cells are clear. Orbits are clear. Other: None. IMPRESSION: 1. No acute intracranial findings.  No evidence trauma. 2. Remote RIGHT occipital infarction. 3. Atrophy white matter microvascular disease Electronically Signed   By: Suzy Bouchard M.D.   On: 12/19/2017 17:37    Procedures Procedures (including critical care time)  Medications Ordered in ED Medications  cephALEXin (KEFLEX) capsule 250 mg (has no administration in time range)  sodium chloride 0.9 % bolus 1,000 mL (1,000 mLs Intravenous New Bag/Given 12/19/17 1727)  traMADol (ULTRAM) tablet 50 mg (50 mg Oral Given 12/19/17 1823)     Initial Impression / Assessment and Plan / ED Course  I have reviewed the triage vital signs and the nursing notes.  Pertinent labs & imaging results that were available during my care of the patient were reviewed by me and considered in my medical decision making (see chart for details).     Patient does not have any focal deficits on exam.  CT head obtained given the multiple falls while on Plavix but this is negative for head bleed.  Labs show minimal bump in creatinine but no renal failure compared to old.  She was given a bolus of fluids.  Creatinine clearance around 25 and thus for her urinary tract infection she will be given a reduced dose Keflex.  However I do not think this represents pyelonephritis.  Her vital signs are unremarkable.  I offered to ambulate  her with a walker to test how she is but she declined stating that she is been walking including today.  She is informed that she should probably be using a walker at all times as all these falls have occurred without the walker and she is had some balance and dizziness issues since the subdural.  Otherwise she appears stable for discharge home to follow-up with her PCP.  I highly doubt stroke.  Final Clinical Impressions(s) / ED Diagnoses   Final diagnoses:  Fall, initial encounter  Acute UTI (urinary tract infection)  Generalized weakness    ED Discharge Orders         Ordered    cephALEXin (KEFLEX) 250 MG capsule  2 times daily     12/19/17 2021           Sherwood Gambler, MD 12/19/17 2030

## 2017-12-19 NOTE — ED Notes (Signed)
Pt aware of need for urine sample. Pt states she is unable to provide one at this time.

## 2017-12-22 LAB — URINE CULTURE: Culture: >=100000 — AB

## 2017-12-23 ENCOUNTER — Telehealth: Payer: Self-pay

## 2017-12-23 NOTE — Telephone Encounter (Signed)
Post ED Visit - Positive Culture Follow-up: Successful Patient Follow-Up  Culture assessed and recommendations reviewed by:  []  Elenor Quinones, Pharm.D. []  Heide Guile, Pharm.D., BCPS AQ-ID []  Parks Neptune, Pharm.D., BCPS []  Alycia Rossetti, Pharm.D., BCPS []  [place], [place].D., BCPS, AAHIVP []  Legrand Como, Pharm.D., BCPS, AAHIVP []  Salome Arnt, PharmD, BCPS []  Johnnette Gourd, PharmD, BCPS []  Hughes Better, PharmD, BCPS []  Leeroy Cha, PharmD Gwenlyn Found Pharm D Positive urine culture  []  Patient discharged without antimicrobial prescription and treatment is now indicated [x]  Organism is resistant to prescribed ED discharge antimicrobial []  Patient with positive blood cultures  Changes discussed with ED provider: Janeece Fitting [facility] New antibiotic prescription Ciprofloxacin 250 mg BID x 3 days Called to Valerie Ramirez [phone]  Contacted patient, date 12/23/17, time [place], Valerie Ramirez Tollie Pizza 12/23/2017, 11:00 AM

## 2017-12-23 NOTE — Progress Notes (Signed)
ED Antimicrobial Stewardship Positive Culture Follow Up   Valerie Ramirez is an 84 y.o. female who presented to [facility] on 12/19/2017 with a chief complaint of  Chief Complaint  Patient presents with  . Chest Pain  . Fatigue    Recent Results (from the past 720 hour(s))  Urine culture     Status: Abnormal   Collection Time: 12/19/17  7:37 PM  Result Value Ref Range Status   Specimen Description URINE, RANDOM  Final   Special Requests   Final    NONE Performed at [facility], [address], [place],  [address]    Culture >=100,000 COLONIES/mL CITROBACTER FREUNDII (A)  Final   Report Status 12/22/2017 FINAL  Final   Organism ID, Bacteria CITROBACTER FREUNDII (A)  Final      Susceptibility   Citrobacter freundii - MIC*    CEFAZOLIN >=64 RESISTANT Resistant     CEFTRIAXONE <=1 SENSITIVE Sensitive     CIPROFLOXACIN <=0.25 SENSITIVE Sensitive     GENTAMICIN <=1 SENSITIVE Sensitive     IMIPENEM <=0.25 SENSITIVE Sensitive     NITROFURANTOIN <=16 SENSITIVE Sensitive     TRIMETH/SULFA <=20 SENSITIVE Sensitive     PIP/TAZO <=4 SENSITIVE Sensitive     * >=100,000 COLONIES/mL CITROBACTER FREUNDII    [x]  Treated with cephalexin, organism resistant to prescribed antimicrobial   New antibiotic prescription: Ciprofloxacin 250mg  BID x3days  ED Provider: Darrol Jump James Lafalce 12/23/2017, 10:10 AM Clinical Pharmacist Monday - Friday phone -  [phone] Saturday - Sunday phone - [phone]

## 2017-12-26 ENCOUNTER — Encounter: Payer: Self-pay | Admitting: Family Medicine

## 2017-12-26 ENCOUNTER — Ambulatory Visit (INDEPENDENT_AMBULATORY_CARE_PROVIDER_SITE_OTHER): Payer: Medicare Other | Admitting: Family Medicine

## 2017-12-26 VITALS — BP 128/58 | HR 80 | Temp 97.9°F | Ht 62.0 in | Wt 193.0 lb

## 2017-12-26 DIAGNOSIS — Z9181 History of falling: Secondary | ICD-10-CM | POA: Diagnosis not present

## 2017-12-26 DIAGNOSIS — N183 Chronic kidney disease, stage 3 unspecified: Secondary | ICD-10-CM

## 2017-12-26 DIAGNOSIS — I10 Essential (primary) hypertension: Secondary | ICD-10-CM

## 2017-12-26 DIAGNOSIS — N3000 Acute cystitis without hematuria: Secondary | ICD-10-CM | POA: Diagnosis not present

## 2017-12-26 DIAGNOSIS — F0781 Postconcussional syndrome: Secondary | ICD-10-CM

## 2017-12-26 DIAGNOSIS — R197 Diarrhea, unspecified: Secondary | ICD-10-CM | POA: Insufficient documentation

## 2017-12-26 DIAGNOSIS — I2510 Atherosclerotic heart disease of native coronary artery without angina pectoris: Secondary | ICD-10-CM | POA: Diagnosis not present

## 2017-12-26 DIAGNOSIS — D649 Anemia, unspecified: Secondary | ICD-10-CM | POA: Diagnosis not present

## 2017-12-26 DIAGNOSIS — Z8679 Personal history of other diseases of the circulatory system: Secondary | ICD-10-CM

## 2017-12-26 DIAGNOSIS — N390 Urinary tract infection, site not specified: Secondary | ICD-10-CM | POA: Insufficient documentation

## 2017-12-26 DIAGNOSIS — R42 Dizziness and giddiness: Secondary | ICD-10-CM | POA: Diagnosis not present

## 2017-12-26 MED ORDER — FERROUS SULFATE 325 (65 FE) MG PO TABS: 325.0000 mg | ORAL_TABLET | Freq: Two times a day (BID) | ORAL | 5 refills | Status: DC

## 2017-12-26 NOTE — Assessment & Plan Note (Addendum)
bp in fair control at this time  BP Readings from Last 1 Encounters:  12/26/17 (!) 128/58   No changes needed Most recent labs reviewed  Disc lifstyle change with low sodium diet and exercise  In setting of CAD Continue amlodipine, coreg, plavix, cardura , lisinopril

## 2017-12-26 NOTE — Assessment & Plan Note (Signed)
Per pt ongoing since her hospitalization in June (at the time c diff neg)  However stools are more watery/ she feels ill/ poor appetite and wt loss  States she "cannot eat anything"  Will re check test for c diff -if neg consider ref to GI  ? If poss bacterial overgrowth  Disc imp of preventing dehydration in light of renal fxn Reviewed hospital records, lab results and studies in detail

## 2017-12-26 NOTE — Assessment & Plan Note (Signed)
Cr at 1.79 at last ED visit Treating for uti (cipro- citrobacter is not sens to keflex)  Enc fluid intake- having difficulty with this due to diarrhea and inability to take in much at a time  Also anemia persists (did refill iron) - most likely due to renal dz however

## 2017-12-26 NOTE — Assessment & Plan Note (Signed)
Citrobacter-resistant to keflex  Will finish 3 d course of renally dosed cipro

## 2017-12-26 NOTE — Assessment & Plan Note (Signed)
Since fall and subdural hematoma in June  CT scan is now clear  Having multiple falls Ref to neurology

## 2017-12-26 NOTE — Assessment & Plan Note (Signed)
Refilled iron - which makes her feel better Suspect this may be related to her CKD For renal f/u -will disc further

## 2017-12-26 NOTE — Patient Instructions (Signed)
Start back on iron   We will do a stool test for c diff (type of diarrhea from antibiotics and being in the hospital)   Try to keep up with fluids   Finish the cipro  We will refer you to neurology for dizziness /headache   Do re schedule your kidney doctor appt

## 2017-12-26 NOTE — Assessment & Plan Note (Signed)
Enc wt loss to help mobility and overall health - but has lost wt due to diarrhea  Nutritional status is guarded until this can be figured out and treated

## 2017-12-26 NOTE — Progress Notes (Signed)
Subjective:    Patient ID: Valerie Ramirez, female    DOB: 10-27-1932, 84 y.o.   MRN: [id number]  HPI Here for f/u of ED visit 9/2  Weakness/dizziness and several falls with lower anterior rib pain Also poor appetite  No changes on EKG  Dg Ribs Bilateral W/chest  Result Date: 12/19/2017 CLINICAL DATA:  Rib pain.  RIGHT-sided rib pain.  Fall EXAM: BILATERAL RIBS AND CHEST - 4+ VIEW COMPARISON:  None. FINDINGS: Normal cardiac silhouette.  No pulmonary contusion or pleural fluid. Dedicated views of the RIGHT ribs demonstrate no displaced fracture. Dedicated views of the LEFT lower ribs demonstrate no displaced fracture. IMPRESSION: 1. No evidence of thoracic trauma. 2. No evidence rib fracture. Electronically Signed   By: Suzy Bouchard M.D.   On: 12/19/2017 17:21   Ct Head Wo Contrast  Result Date: 12/19/2017 CLINICAL DATA:  Multiple falls EXAM: CT HEAD WITHOUT CONTRAST TECHNIQUE: Contiguous axial images were obtained from the base of the skull through the vertex without intravenous contrast. COMPARISON:  PET-CT 10/15/2017 FINDINGS: Brain: Remote infarction in the medial RIGHT occipital lobe again demonstrated. Extensive periventricular subcortical white matter hypodensities. No acute intracranial hemorrhage. No focal mass lesion. No CT evidence of acute infarction. No midline shift or mass effect. No hydrocephalus. Basilar cisterns are patent. Vascular: No hyperdense vessel or unexpected calcification. Skull: Normal. Negative for fracture or focal lesion. Sinuses/Orbits: Paranasal sinuses and mastoid air cells are clear. Orbits are clear. Other: None. IMPRESSION: 1. No acute intracranial findings.  No evidence trauma. 2. Remote RIGHT occipital infarction. 3. Atrophy white matter microvascular disease Electronically Signed   By: Suzy Bouchard M.D.   On: 12/19/2017 17:37    H/o subdural hematoma in the past Currently on plavix   Noted dec Cr Cl Lab Results  Component Value Date   CREATININE 1.79 (H) 12/19/2017   BUN 34 (H) 12/19/2017   NA 137 12/19/2017   K 3.8 12/19/2017   CL 109 12/19/2017   CO2 17 (L) 12/19/2017   Pos UA - given low dose keflex for treatment  She does see renal   Ucx returned with citrobacter and it was resistant to keflex  She was re tx with low dose cipro for 3 d  Lab Results  Component Value Date   WBC 4.8 12/19/2017   HGB 8.5 (L) 12/19/2017   HCT 28.2 (L) 12/19/2017   MCV 94.0 12/19/2017   PLT 217 12/19/2017   Lab Results  Component Value Date   TSH 2.61 07/27/2017    Lab Results  Component Value Date   ALT 41 12/19/2017   AST 48 (H) 12/19/2017   ALKPHOS 136 (H) 12/19/2017   BILITOT 1.1 12/19/2017    Wt Readings from Last 3 Encounters:  12/26/17 193 lb (87.5 kg)  12/19/17 205 lb (93 kg)  10/26/17 202 lb 12 oz (92 kg)   35.30 kg/m   Now she gets severe diarrhea every time she eats or drinks  Going on for a long time  Taking some immodium over the counter  Stays weak - ? Due to not eating Loosing weight  Frequent bms - between 5 and 10  Watery and soft  No blood (brown to black dep on iron)   Need iron ref   Still dizzy since her first head injury  Falling a lot  Using walker instead of cane  Legs tend to give way and she falls backwards (perhaps a little worse on the R)    Still  having HA and dizziness  Has had PT for balance (at home)   Had to cancel the last renal   BP Readings from Last 3 Encounters:  12/26/17 (!) 128/58  12/19/17 125/70  10/26/17 130/64    Patient Active Problem List   Diagnosis Date Noted  . Diarrhea 12/26/2017  . Dizziness 12/26/2017  . UTI (urinary tract infection) 12/26/2017  . Post concussive syndrome 10/29/2017  . Depression 10/26/2017  . H/O subdural hemorrhage 09/18/2017  . CAD (coronary artery disease) 09/18/2017  . Closed fracture of nasal bones   . Epistaxis   . Elevated transaminase level 08/03/2017  . Radial artery injury, left, sequela 06/15/2017  . H/O:  CVA (cerebrovascular accident) 05/26/2017  . Sick sinus syndrome ([place]) 05/25/2017  . Unstable angina ([place])   . Exertional chest pain 04/05/2017  . CKD (chronic kidney disease), stage III ([place]) 04/05/2017  . Normocytic normochromic anemia 04/05/2017  . Thoracic back pain 09/15/2016  . History of radius fracture 06/01/2016  . AKI (acute kidney injury) ([place]) 04/25/2016  . Status post cervical spinal fusion 08/18/2015  . Osteoarthritis, hip, bilateral 08/12/2014  . Hearing loss in right ear 06/12/2014  . Colon cancer screening 08/10/2013  . Seborrheic keratoses, inflamed 08/10/2013  . History of fall 04/10/2013  . Encounter for Medicare annual wellness exam 08/08/2012  . Gout 07/14/2012  . History of endometrial cancer 05/25/2012  . Degenerative joint disease of cervical spine 05/03/2011  . Other screening mammogram 03/31/2011  . Hyperglycemia 09/26/2007  . Morbid obesity ([place]) 07/12/2007  . Essential hypertension 07/11/2007  . History of cardiomyopathy 07/11/2007  . Osteoarthritis 07/11/2007  . 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 Hypothyroidism 10/05/2006  . HYPERCHOLESTEROLEMIA, PURE 10/05/2006   Past Medical History:  Diagnosis Date  . Arthritis    OA  . CAD (coronary artery disease)   . Cancer ([place])    endometrial  . GERD (gastroesophageal reflux disease)   . Hearing loss   . History of kidney stones   . Hyperlipidemia   . Hypertension   . Hypothyroid    "not in years"  . Shortness of breath dyspnea    04/25/17- not anymore   Past Surgical History:  Procedure Laterality Date  . ABDOMINAL HYSTERECTOMY    . ANTERIOR CERVICAL DECOMP/DISCECTOMY FUSION [name] 08/18/2015   Procedure: C5-6, C6-7 Anterior Cervical Discectomy and Fusion, Allograft, Plate;  Surgeon: Marybelle Killings, MD;  Location: [facility];  Service: Orthopedics;  Laterality: [name];  . bone spur removed Right    shoulder  . BOWEL RESECTION  07/04/2012   Procedure: SMALL BOWEL RESECTION;  Surgeon: Alvino Chapel, MD;  Location: [facility];  Service: Gynecology;;  . CHOLECYSTECTOMY    . CORONARY STENT INTERVENTION [name] 04/06/2017   Procedure: CORONARY STENT INTERVENTION;  Surgeon: Wellington Hampshire, MD;  Location: [facility];  Service: Cardiovascular;  Laterality: [name];  . DILATION AND CURETTAGE OF UTERUS  1999  . EYE SURGERY Bilateral    cataracts  . FALSE ANEURYSM REPAIR Right 04/26/2017   Procedure: 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;  Surgeon: Conrad Wakeman, MD;  Location: [facility];  Service: Vascular;  Laterality: Right;  . HAND SURGERY  12/2008   after hand fx/due to fall  . JOINT REPLACEMENT Bilateral 07/1998   knees  . KNEE ARTHROPLASTY  05/23/1998   total right  . LAPAROTOMY Bilateral 07/04/2012   Procedure: EXPLORATORY LAPAROTOMY TOTAL ABDOMINAL HYSTERECTOMY BILATERAL SALPINGO-OOPHORECTOMY with small bowel resection ;  Surgeon: Alvino Chapel, MD;  Location: [facility];  Service: Gynecology;  Laterality: Bilateral;  . LEFT HEART CATH AND CORONARY ANGIOGRAPHY [name] 04/06/2017   Procedure: LEFT HEART CATH AND CORONARY ANGIOGRAPHY;  Surgeon: Jolaine Artist, MD;  Location: [facility];  Service: Cardiovascular;  Laterality: [name];  . polyp removal  1999  . TUBAL LIGATION     Social History   Tobacco Use  . Smoking status: Never Smoker  . Smokeless tobacco: Never Used  Substance Use Topics  . Alcohol use: No    Alcohol/week: 0.0 standard drinks  . Drug use: No   Family History  Problem Relation Age of Onset  . Cancer Brother        LEUKEMIA  . Cancer Brother        LUNG  . Cancer Brother        STOMACH  . Heart disease Sister        CABG  . Stroke Sister   . Heart disease Brother        CABG   Allergies  Allergen Reactions  . Allopurinol Nausea Only  . Lipitor [Atorvastatin Calcium] Other (See Comments)    Leg aches and aching all over  . Morphine And Related Other (See Comments)    Makes the patient "loopy,"   . Simvastatin Other  (See Comments)    MUSCLE PAIN  . Tape Itching and Rash    "Cannot use paper tape" --- also causes blisters. Use plastic tape   Current Outpatient Medications on File Prior to Visit  Medication Sig Dispense Refill  . acetaminophen (TYLENOL) 650 MG CR tablet Take 650 mg by mouth every 8 (eight) hours as needed for pain.    [name] amLODipine (NORVASC) 10 MG tablet Take 1 tablet (10 mg total) by mouth daily. 30 tablet 11  . carvedilol (COREG) 25 MG tablet Take 1 tablet (25 mg total) by mouth 2 (two) times daily with a meal. 60 tablet 11  . clopidogrel (PLAVIX) 75 MG tablet Take 75 mg by mouth daily.    . colchicine 0.6 MG tablet Take 1 tablet (0.6 mg total) by mouth 2 (two) times daily. 60 tablet 11  . doxazosin (CARDURA) 2 MG tablet Take 1 tablet (2 mg total) by mouth at bedtime. 30 tablet 4  . DULoxetine (CYMBALTA) 30 MG capsule Take 1 capsule (30 mg total) by mouth daily. 90 capsule 3  . lisinopril (PRINIVIL,ZESTRIL) 5 MG tablet Take 1 tablet (5 mg total) by mouth daily. 90 tablet 3  . loperamide (IMODIUM) 2 MG capsule Take 1 capsule (2 mg total) by mouth as needed for diarrhea or loose stools. 20 capsule 0  . losartan (COZAAR) 25 MG tablet Take 25 mg by mouth daily.    . nitroGLYCERIN (NITROSTAT) 0.4 MG SL tablet Place 1 tablet (0.4 mg total) under the tongue every 5 (five) minutes x 3 doses as needed for chest pain. 25 tablet 12  . polyethylene glycol (MIRALAX / GLYCOLAX) packet Take 17 g by mouth daily as needed for mild constipation. 14 each 0  . rosuvastatin (CRESTOR) 10 MG tablet Take 1 tablet (10 mg total) by mouth daily at 6 PM. 30 tablet 11  . vitamin B-12 (CYANOCOBALAMIN) 1000 MCG tablet Take 1,000 mcg by mouth daily.     No current facility-administered medications on file prior to visit.      Review of Systems  Constitutional: Positive for fatigue. Negative for activity change, appetite change, fever and unexpected weight  change.       Wt loss due to poor po intake  HENT: Negative  for congestion, ear pain, rhinorrhea, sinus pressure and sore throat.   Eyes: Negative for pain, redness and visual disturbance.  Respiratory: Negative for cough, shortness of breath and wheezing.   Cardiovascular: Negative for chest pain and palpitations.  Gastrointestinal: Positive for diarrhea, nausea and vomiting. Negative for abdominal distention, abdominal pain, anal bleeding, blood in stool, constipation and rectal pain.  Endocrine: Negative for polydipsia and polyuria.  Genitourinary: Negative for dysuria, frequency, hematuria and urgency.  Musculoskeletal: Negative for arthralgias, back pain and myalgias.  Skin: Negative for pallor and rash.  Allergic/Immunologic: Negative for environmental allergies.  Neurological: Positive for dizziness. Negative for tremors, seizures, syncope, facial asymmetry, speech difficulty, weakness, numbness and headaches.  Hematological: Negative for adenopathy. Does not bruise/bleed easily.  Psychiatric/Behavioral: Negative for decreased concentration and dysphoric mood. The patient is not nervous/anxious.        Objective:   Physical Exam  Constitutional: She is oriented to person, place, and time. She appears well-developed and well-nourished. No distress.  obese and fatigued appearing   HENT:  Head: Normocephalic and atraumatic.  Right Ear: External ear normal.  Left Ear: External ear normal.  Nose: Nose normal.  Mouth/Throat: Oropharynx is clear and moist. No oropharyngeal exudate.  No sinus tenderness No temporal tenderness  No TMJ tenderness  Eyes: Pupils are equal, round, and reactive to light. Conjunctivae and EOM are normal. Right eye exhibits no discharge. Left eye exhibits no discharge. No scleral icterus.  No nystagmus  Neck: Normal range of motion and full passive range of motion without pain. Neck supple. No JVD present. Carotid bruit is not present. No tracheal deviation present. No thyromegaly present.  Cardiovascular: Normal rate,  regular rhythm and normal heart sounds.  No murmur heard. Pulmonary/Chest: Effort normal and breath sounds normal. No stridor. No respiratory distress. She has no wheezes. She has no rales.  Abdominal: Soft. Bowel sounds are normal. She exhibits no distension, no abdominal bruit and no mass. There is no hepatosplenomegaly. There is no tenderness. There is no rigidity, no rebound, no guarding, no CVA tenderness, no tenderness at McBurney's point and negative Murphy's sign. No hernia.  Musculoskeletal: She exhibits no edema or tenderness.  Lymphadenopathy:    She has no cervical adenopathy.  Neurological: She is alert and oriented to person, place, and time. She has normal strength and normal reflexes. She displays no atrophy, no tremor and normal reflexes. No cranial nerve deficit or sensory deficit. She exhibits normal muscle tone. She displays a negative Romberg sign. Coordination and gait normal.  No focal cerebellar signs   Skin: Skin is warm and dry. No rash noted. No pallor.  Psychiatric: She has a normal mood and affect. Her behavior is normal. Thought content normal.  More quiet than usual  Seems fatigued  Answers questions appropriately          Assessment & Plan:   Problem List Items Addressed This Visit      Cardiovascular and Mediastinum   Essential hypertension    bp in fair control at this time  BP Readings from Last 1 Encounters:  12/26/17 (!) 128/58   No changes needed Most recent labs reviewed  Disc lifstyle change with low sodium diet and exercise  In setting of CAD Continue amlodipine, coreg, plavix, cardura , lisinopril          Nervous and Auditory   Post concussive syndrome  Pt here with family today- stating that dizziness is worse as is balance in general- has had multiple falls (in setting of past subdural hematoma and current plavix is worrisome)  Pt states posterior headache is not improving  Still having trouble concentrating as well  Disc use  of walker for support  Reviewed hospital records, lab results and studies in detail   (recent ED visit for fall)- CT of head was reassuring -no subdural  Her neurosurgeon signed off  Will ref to neurology  Safety and fall prev disc in detail       Relevant Orders   Ambulatory referral to Neurology     Genitourinary   CKD (chronic kidney disease), stage III ([place])    Cr at 1.79 at last ED visit Treating for uti (cipro- citrobacter is not sens to keflex)  Enc fluid intake- having difficulty with this due to diarrhea and inability to take in much at a time  Also anemia persists (did refill iron) - most likely due to renal dz however        UTI (urinary tract infection)    Citrobacter-resistant to keflex  Will finish 3 d course of renally dosed cipro        Other   Diarrhea - Primary    Per pt ongoing since her hospitalization in June (at the time c diff neg)  However stools are more watery/ she feels ill/ poor appetite and wt loss  States she "cannot eat anything"  Will re check test for c diff -if neg consider ref to GI  ? If poss bacterial overgrowth  Disc imp of preventing dehydration in light of renal fxn Reviewed hospital records, lab results and studies in detail        Relevant Orders   C. difficile GDH and Toxin A/B   Dizziness    Since fall and subdural hematoma in June  CT scan is now clear  Having multiple falls Ref to neurology      Relevant Orders   Ambulatory referral to Neurology   H/O subdural hemorrhage    Despite clearing on CT- has c/o headache and dizziness/poor balance since event Ref to neurology      Relevant Orders   Ambulatory referral to Neurology   History of fall    Most recent fall- ant/post rib injuries (no fx)  Warm compresses /analgesic Disc fall prev -difficut with dizziness Neuro ref done Has done PT Enc strongly to use walker at all times       Morbid obesity ([place])    Enc wt loss to help mobility and overall health - but  has lost wt due to diarrhea  Nutritional status is guarded until this can be figured out and treated       Normocytic normochromic anemia    Refilled iron - which makes her feel better Suspect this may be related to her CKD For renal f/u -will disc further       Relevant Medications   ferrous sulfate 325 (65 FE) MG tablet

## 2017-12-26 NOTE — Assessment & Plan Note (Signed)
Despite clearing on CT- has c/o headache and dizziness/poor balance since event Ref to neurology

## 2017-12-26 NOTE — Assessment & Plan Note (Signed)
Pt here with family today- stating that dizziness is worse as is balance in general- has had multiple falls (in setting of past subdural hematoma and current plavix is worrisome)  Pt states posterior headache is not improving  Still having trouble concentrating as well  Disc use of walker for support  Reviewed hospital records, lab results and studies in detail   (recent ED visit for fall)- CT of head was reassuring -no subdural  Her neurosurgeon signed off  Will ref to neurology  Safety and fall prev disc in detail

## 2017-12-26 NOTE — Assessment & Plan Note (Signed)
Most recent fall- ant/post rib injuries (no fx)  Warm compresses /analgesic Disc fall prev -difficut with dizziness Neuro ref done Has done PT Enc strongly to use walker at all times

## 2018-01-02 ENCOUNTER — Encounter: Payer: Self-pay | Admitting: Neurology

## 2018-01-02 ENCOUNTER — Ambulatory Visit (INDEPENDENT_AMBULATORY_CARE_PROVIDER_SITE_OTHER): Payer: Medicare Other | Admitting: Neurology

## 2018-01-02 VITALS — Ht 62.0 in | Wt 199.0 lb

## 2018-01-02 DIAGNOSIS — S134XXA Sprain of ligaments of cervical spine, initial encounter: Secondary | ICD-10-CM | POA: Diagnosis not present

## 2018-01-02 DIAGNOSIS — I2510 Atherosclerotic heart disease of native coronary artery without angina pectoris: Secondary | ICD-10-CM | POA: Diagnosis not present

## 2018-01-02 DIAGNOSIS — S161XXA Strain of muscle, fascia and tendon at neck level, initial encounter: Secondary | ICD-10-CM | POA: Diagnosis not present

## 2018-01-02 DIAGNOSIS — M5481 Occipital neuralgia: Secondary | ICD-10-CM | POA: Diagnosis not present

## 2018-01-02 DIAGNOSIS — W19XXXA Unspecified fall, initial encounter: Secondary | ICD-10-CM | POA: Diagnosis not present

## 2018-01-02 DIAGNOSIS — M542 Cervicalgia: Secondary | ICD-10-CM | POA: Diagnosis not present

## 2018-01-02 DIAGNOSIS — R269 Unspecified abnormalities of gait and mobility: Secondary | ICD-10-CM

## 2018-01-02 DIAGNOSIS — I951 Orthostatic hypotension: Secondary | ICD-10-CM

## 2018-01-02 NOTE — Patient Instructions (Addendum)
1. See Dr. Martinique to discuss management of blood pressure medications and orthostatic hypotension 2. If still continue to have dizziness: see below 3. Occipital neuralgia, will send to physical therapy for neck exercises 4. Dr. Ernestina Patches at [facility] for c2/c3 medial branch blocks to help with occipital neuralgia 4. Consider occipital nerve blocks     Non-Drug Treatment for Low Blood Pressure on Standing:  1. Changing Postures: . Change posture slowly when getting up, especially in the morning . Hold on to something during the first few minutes after standing up. Do not start walking as soon as you get up from the chair . Avoid prolonged recumbency or lying down . Raise the head of the bed by 10 to 20 degrees  2. Exercise: . Perform Isotonic exercise, e.g.recumbent bike, pedaling movements while sitting in a chair . Avoid exercises where you have to strain   3. Avoid Pooling of blood in legs: . Wear custom-fitted elastic stockings. The ones which extend to the abdomen work even better. Consider wearing an abdominal binder. . Perform physical counter-maneuvers, such as crossing legs and tensing leg muscles.  4. Eating and Drinking: . Small meals are recommended. Avoid large meals. Avoid standing suddenly after a large meal . Avoid alcohol . Increase intake of fluids and regular salt. A daily intake of up to 10 grams of sodium per day and a fluid intake of 2.0 to 2.5 liters per day (8 to 10 glasses of water) is recommended.  . Rapid (over 3 minutes) ingestion of approximately 0.5 liter (2 glasses of water) of tap water, raises blood pressure within 5 to 15 minutes and lasts for an hour.  5. Other Tips: . Avoid hot baths. Instead take warm baths . Maintain a BP record standing and lying down . If you are only any BP lowering drugs (antihypertensives, diuretics, antidepressants, drugs for prostate, etc) ask your doctor to revisit the need to keep you on these drugs . If all  non-drug therapy fails, ask your doctor about drug therapy   Follow-up with primary care physician.    Orthostatic Hypotension Orthostatic hypotension is a sudden drop in blood pressure that happens when you quickly change positions, such as when you get up from a seated or lying position. Blood pressure is a measurement of how strongly, or weakly, your blood is pressing against the walls of your arteries. Arteries are blood vessels that carry blood from your heart throughout your body. When blood pressure is too low, you may not get enough blood to your brain or to the rest of your organs. This can cause weakness, light-headedness, rapid heartbeat, and fainting. This can last for just a few seconds or for up to a few minutes. Orthostatic hypotension is usually not a serious problem. However, if it happens frequently or gets worse, it may be a sign of something more serious. What are the causes? This condition may be caused by:  Sudden changes in posture, such as standing up quickly after you have been sitting or lying down.  Blood loss.  Loss of body fluids (dehydration).  Heart problems.  Hormone (endocrine) problems.  Pregnancy.  Severe infection.  Lack of certain nutrients.  Severe allergic reactions (anaphylaxis).  Certain medicines, such as blood pressure medicine or medicines that make the body lose excess fluids (diuretics). Sometimes, this condition can be caused by not taking medicine as directed, such as taking too much of a certain medicine.  What increases the risk? Certain factors can make you  more likely to develop orthostatic hypotension, including:  Age. Risk increases as you get older.  Conditions that affect the heart or the central nervous system.  Taking certain medicines, such as blood pressure medicine or diuretics.  Being pregnant.  What are the signs or symptoms? Symptoms of this condition may  include:  Weakness.  Light-headedness.  Dizziness.  Blurred vision.  Fatigue.  Rapid heartbeat.  Fainting, in severe cases.  How is this diagnosed? This condition is diagnosed based on:  Your medical history.  Your symptoms.  Your blood pressure measurement. Your health care provider will check your blood pressure when you are: ? Lying down. ? Sitting. ? Standing.  A blood pressure reading is recorded as two numbers, such as "120 over 80" (or 120/80). The first ("top") number is called the systolic pressure. It is a measure of the pressure in your arteries as your heart beats. The second ("bottom") number is called the diastolic pressure. It is a measure of the pressure in your arteries when your heart relaxes between beats. Blood pressure is measured in a unit called mm Hg. Healthy blood pressure for adults is 120/80. If your blood pressure is below 90/60, you may be diagnosed with hypotension. Other information or tests that may be used to diagnose orthostatic hypotension include:  Your other vital signs, such as your heart rate and temperature.  Blood tests.  Tilt table test. For this test, you will be safely secured to a table that moves you from a lying position to an upright position. Your heart rhythm and blood pressure will be monitored during the test.  How is this treated? Treatment for this condition may include:  Changing your diet. This may involve eating more salt (sodium) or drinking more water.  Taking medicines to raise your blood pressure.  Changing the dosage of certain medicines you are taking that might be lowering your blood pressure.  Wearing compression stockings. These stockings help to prevent blood clots and reduce swelling in your legs.  In some cases, you may need to go to the hospital for:  Fluid replacement. This means you will receive fluids through an IV tube.  Blood replacement. This means you will receive donated blood through an  IV tube (transfusion).  Treating an infection or heart problems, if this applies.  Monitoring. You may need to be monitored while medicines that you are taking wear off.  Follow these instructions at home: Eating and drinking   Drink enough fluid to keep your urine clear or pale yellow.  Eat a healthy diet and follow instructions from your health care provider about eating or drinking restrictions. A healthy diet includes: ? Fresh fruits and vegetables. ? Whole grains. ? Lean meats. ? Low-fat dairy products.  Eat extra salt only as directed. Do not add extra salt to your diet unless your health care provider told you to do that.  Eat frequent, small meals.  Avoid standing up suddenly after eating. Medicines  Take over-the-counter and prescription medicines only as told by your health care provider. ? Follow instructions from your health care provider about changing the dosage of your current medicines, if this applies. ? Do not stop or adjust any of your medicines on your own. General instructions  Wear compression stockings as told by your health care provider.  Get up slowly from lying down or sitting positions. This gives your blood pressure a chance to adjust.  Avoid hot showers and excessive heat as directed by your health care  provider.  Return to your normal activities as told by your health care provider. Ask your health care provider what activities are safe for you.  Do not use any products that contain nicotine or tobacco, such as cigarettes and e-cigarettes. If you need help quitting, ask your health care provider.  Keep all follow-up visits as told by your health care provider. This is important. Contact a health care provider if:  You vomit.  You have diarrhea.  You have a fever for more than 2-3 days.  You feel more thirsty than usual.  You feel weak and tired. Get help right away if:  You have chest pain.  You have a fast or irregular  heartbeat.  You develop numbness in any part of your body.  You cannot move your arms or your legs.  You have trouble speaking.  You become sweaty or feel lightheaded.  You faint.  You feel short of breath.  You have trouble staying awake.  You feel confused. This information is not intended to replace advice given to you by your health care provider. Make sure you discuss any questions you have with your health care provider. Document Released: 03/26/2002 Document Revised: 12/23/2015 Document Reviewed: 09/26/2015 Elsevier Interactive Patient Education  2018 [place] in the Home Falls can cause injuries. They can happen to people of all ages. There are many things you can do to make your home safe and to help prevent falls. What can I do on the outside of my home?  Regularly fix the edges of walkways and driveways and fix any cracks.  Remove anything that might make you trip as you walk through a door, such as a raised step or threshold.  Trim any bushes or trees on the path to your home.  Use bright outdoor lighting.  Clear any walking paths of anything that might make someone trip, such as rocks or tools.  Regularly check to see if handrails are loose or broken. Make sure that both sides of any steps have handrails.  Any raised decks and porches should have guardrails on the edges.  Have any leaves, snow, or ice cleared regularly.  Use sand or salt on walking paths during winter.  Clean up any spills in your garage right away. This includes oil or grease spills. What can I do in the bathroom?  Use night lights.  Install grab bars by the toilet and in the tub and shower. Do not use towel bars as grab bars.  Use non-skid mats or decals in the tub or shower.  If you need to sit down in the shower, use a plastic, non-slip stool.  Keep the floor dry. Clean up any water that spills on the floor as soon as it happens.  Remove soap buildup in  the tub or shower regularly.  Attach bath mats securely with double-sided non-slip rug tape.  Do not have throw rugs and other things on the floor that can make you trip. What can I do in the bedroom?  Use night lights.  Make sure that you have a light by your bed that is easy to reach.  Do not use any sheets or blankets that are too big for your bed. They should not hang down onto the floor.  Have a firm chair that has side arms. You can use this for support while you get dressed.  Do not have throw rugs and other things on the floor that can make you trip.  What can I do in the kitchen?  Clean up any spills right away.  Avoid walking on wet floors.  Keep items that you use a lot in easy-to-reach places.  If you need to reach something above you, use a strong step stool that has a grab bar.  Keep electrical cords out of the way.  Do not use floor polish or wax that makes floors slippery. If you must use wax, use non-skid floor wax.  Do not have throw rugs and other things on the floor that can make you trip. What can I do with my stairs?  Do not leave any items on the stairs.  Make sure that there are handrails on both sides of the stairs and use them. Fix handrails that are broken or loose. Make sure that handrails are as long as the stairways.  Check any carpeting to make sure that it is firmly attached to the stairs. Fix any carpet that is loose or worn.  Avoid having throw rugs at the top or bottom of the stairs. If you do have throw rugs, attach them to the floor with carpet tape.  Make sure that you have a light switch at the top of the stairs and the bottom of the stairs. If you do not have them, ask someone to add them for you. What else can I do to help prevent falls?  Wear shoes that: ? Do not have high heels. ? Have rubber bottoms. ? Are comfortable and fit you well. ? Are closed at the toe. Do not wear sandals.  If you use a stepladder: ? Make sure that  it is fully opened. Do not climb a closed stepladder. ? Make sure that both sides of the stepladder are locked into place. ? Ask someone to hold it for you, if possible.  Clearly mark and make sure that you can see: ? Any grab bars or handrails. ? First and last steps. ? Where the edge of each step is.  Use tools that help you move around (mobility aids) if they are needed. These include: ? Canes. ? Walkers. ? Scooters. ? Crutches.  Turn on the lights when you go into a dark area. Replace any light bulbs as soon as they burn out.  Set up your furniture so you have a clear path. Avoid moving your furniture around.  If any of your floors are uneven, fix them.  If there are any pets around you, be aware of where they are.  Review your medicines with your doctor. Some medicines can make you feel dizzy. This can increase your chance of falling. Ask your doctor what other things that you can do to help prevent falls. This information is not intended to replace advice given to you by your health care provider. Make sure you discuss any questions you have with your health care provider. Document Released: 01/30/2009 Document Revised: 09/11/2015 Document Reviewed: 05/10/2014 Elsevier Interactive Patient Education  2018 [place].    Occipital Nerve Block Patient Information  Description: The occipital nerves originate in the cervical (neck) spinal cord and travel upward through muscle and tissue to supply sensation to the back of the head and top of the scalp.  In addition, the nerves control some of the muscles of the scalp.  Occipital neuralgia is an irritation of these nerves which can cause headaches, numbness of the scalp, and neck discomfort.     The occipital nerve block will interrupt nerve transmission through these nerves and can  relieve pain and spasm.  The block consists of insertion of a small needle under the skin in the back of the head to deposit local anesthetic  (numbing medicine) and/or steroids around the nerve.  The entire block usually lasts less than 5 minutes.  Conditions which may be treated by occipital blocks:   Muscular pain and spasm of the scalp  Nerve irritation, back of the head  Headaches  Upper neck pain  Preparation for the injection:  1. Do not eat any solid food or dairy products within 8 hours of your appointment. 2. You may drink clear liquids up to 3 hours before appointment.  Clear liquids include water, black coffee, juice or soda.  No milk or cream please. 3. You may take your regular medication, including pain medications, with a sip of water before you appointment.  Diabetics should hold regular insulin (if taken separately) and take 1/2 normal NPH dose the morning of the procedure.  Carry some sugar containing items with you to your appointment. 4. A driver must accompany you and be prepared to drive you home after your procedure. 5. Bring all your current medications with you. 6. An IV may be inserted and sedation may be given at the discretion of the physician. 7. A blood pressure cuff, EKG, and other monitors will often be applied during the procedure.  Some patients may need to have extra oxygen administered for a short period. 8. You will be asked to provide medical information, including your allergies and medications, prior to the procedure.  We must know immediately if you are taking blood thinners (like Coumadin/Warfarin) or if you are allergic to IV iodine contrast (dye).  We must know if you could possible be pregnant.  9. Do not wear a high collared shirt or turtleneck.  Tie long hair up in the back if possible.  Possible side-effects:   Bleeding from needle site  Infection (rare, may require surgery)  Nerve injury (rare)  Hair on back of neck can be tinged with iodine scrub (this will wash out)  Light-headedness (temporary)  Pain at injection site (several days)  Decreased blood pressure (rare,  temporary)  Seizure (very rare)  Call if you experience:   Hives or difficulty breathing ( go to the emergency room)  Inflammation or drainage at the injection site(s)  Please note:  Although the local anesthetic injected can often make your painful muscles or headache feel good for several hours after the injection, the pain may return.  It takes 3-7 days for steroids to work.  You may not notice any pain relief for at least one week.  If effective, we will often do a series of injections spaced 3-6 weeks apart to maximally decrease your pain.  If you have any questions, please call [phone] [facility]

## 2018-01-02 NOTE — Progress Notes (Signed)
[facility]    Provider:  Dr Jaynee Eagles Referring Provider: Abner Greenspan, MD Primary Care Physician:  Tower, Wynelle Fanny, MD  CC:  Dizziness  HPI:  Valerie Ramirez is a 84 y.o. female here as requested by Dr. Glori Bickers for dizziness. Here with daughter who provides much information. PMHx hypothyroid, HTN, HLD, CAD, CKD, SDH after a fall. Dizziness starts in her head when she stands, never happens when sitting. She feels like it starts at the bottom of her feet and comes up the legs like heat and when it gets to the head she falls backwards. She was standing last week and her knees just buckled. Falls backwards and it hurts through her chest. If she stands she can't walk because her head is spinning and she has headaches in the back of her head. Head pain started when she fell in June and had a hematoma.  Things have not been normal since. She went to rehab after the hospital and also had rehab at home. Dizziness always happens when she is standing up. Not when sitting. She also have neck pain. No pain in the feet, some numbness. Last Tuesday she fell and 3x the prior week due to not using her walking aid.  Since then she is using her walker and no more falls, she was not using walker when she fell. No other focal neurologic deficits, associated symptoms, inciting events or modifiable factors.  Reviewed notes, labs and imaging from outside physicians, which showed:  Personally reviewed CT head 12/2017 images and agree with the following:  IMPRESSION: 1. No acute intracranial findings.  No evidence trauma. 2. Remote RIGHT occipital infarction. 3. Atrophy white matter microvascular disease  CMP 9/29 Cr 1.79 and BUN 34  Review of Systems: Patient complains of symptoms per HPI as well as the following symptoms: headache and dizziness. Pertinent negatives and positives per HPI. All others negative.   Social History   Socioeconomic History  . Marital status: Widowed    Spouse name:  Not on file  . Number of children: 2  . Years of education: Not on file  . Highest education level: High school graduate  Occupational History  . Not on file  Social Needs  . Financial resource strain: Not on file  . Food insecurity:    Worry: Not on file    Inability: Not on file  . Transportation needs:    Medical: Not on file    Non-medical: Not on file  Tobacco Use  . Smoking status: Never Smoker  . Smokeless tobacco: Never Used  Substance and Sexual Activity  . Alcohol use: Never    Alcohol/week: 0.0 standard drinks    Frequency: Never  . Drug use: Never  . Sexual activity: Never    Birth control/protection: Post-menopausal  Lifestyle  . Physical activity:    Days per week: Not on file    Minutes per session: Not on file  . Stress: Not on file  Relationships  . Social connections:    Talks on phone: Not on file    Gets together: Not on file    Attends religious service: Not on file    Active member of club or organization: Not on file    Attends meetings of clubs or organizations: Not on file    Relationship status: Not on file  . Intimate partner violence:    Fear of current or ex partner: Not on file    Emotionally abused: Not on file  Physically abused: Not on file    Forced sexual activity: Not on file  Other Topics Concern  . Not on file  Social History Narrative  . Not on file    Family History  Problem Relation Age of Onset  . Heart disease Father   . Cancer Brother        LEUKEMIA  . Cancer Brother        LUNG  . Cancer Brother        STOMACH  . Heart disease Sister        CABG  . Stroke Sister   . Cancer Sister   . Bladder Cancer Sister   . Heart disease Brother        CABG  . Stroke Sister        "massive brain bleed"    Past Medical History:  Diagnosis Date  . Arthritis    OA  . CAD (coronary artery disease)    stent December 2018  . Cancer ([place])    endometrial  . GERD (gastroesophageal reflux disease)   . Hearing loss     . History of kidney stones   . Hyperlipidemia   . Hypertension   . Hypothyroid    "not in years"  . Shortness of breath dyspnea    04/25/17- not anymore    Past Surgical History:  Procedure Laterality Date  . ABDOMINAL HYSTERECTOMY    . ANTERIOR CERVICAL DECOMP/DISCECTOMY FUSION [name] 08/18/2015   Procedure: C5-6, C6-7 Anterior Cervical Discectomy and Fusion, Allograft, Plate;  Surgeon: Marybelle Killings, MD;  Location: [place];  Service: Orthopedics;  Laterality: [name];  . bone spur removed Right    shoulder  . BOWEL RESECTION  07/04/2012   Procedure: SMALL BOWEL RESECTION;  Surgeon: Alvino Chapel, MD;  Location: [facility];  Service: Gynecology;;  . BREAST BIOPSY    . CHOLECYSTECTOMY    . CORONARY STENT INTERVENTION [name] 04/06/2017   Procedure: CORONARY STENT INTERVENTION;  Surgeon: Wellington Hampshire, MD;  Location: [facility];  Service: Cardiovascular;  Laterality: [name];  . DILATION AND CURETTAGE OF UTERUS  1999  . EYE SURGERY Bilateral    cataracts  . 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 HAND SURGERY  12/2008   after hand fx/due to fall  . JOINT REPLACEMENT Bilateral 07/1998   knees  . KNEE ARTHROPLASTY  05/23/1998   total right  . LAPAROTOMY Bilateral 07/04/2012   Procedure: EXPLORATORY LAPAROTOMY TOTAL ABDOMINAL HYSTERECTOMY BILATERAL SALPINGO-OOPHORECTOMY with small bowel resection ;  Surgeon: Alvino Chapel, MD;  Location: [facility];  Service: Gynecology;  Laterality: Bilateral;  . LEFT HEART CATH AND CORONARY ANGIOGRAPHY [name] 04/06/2017   Procedure: LEFT HEART CATH AND CORONARY ANGIOGRAPHY;  Surgeon: Jolaine Artist, MD;  Location: [facility];  Service: Cardiovascular;  Laterality: [name];  . polyp removal  1999  . TUBAL LIGATION      Current Outpatient Medications  Medication Sig Dispense Refill  .  acetaminophen (TYLENOL) 650 MG CR tablet Take 650 mg by mouth every 8 (eight) hours as needed for pain.    [name] amLODipine (NORVASC) 10 MG tablet Take 1 tablet (10 mg total) by mouth daily. 30 tablet 11  . carvedilol (COREG) 25 MG tablet Take 1 tablet (25 mg total) by mouth 2 (two) times daily with a meal.  60 tablet 11  . Cholecalciferol (VITAMIN D PO) Take by mouth.    . clopidogrel (PLAVIX) 75 MG tablet Take 75 mg by mouth daily.    . colchicine 0.6 MG tablet Take 1 tablet (0.6 mg total) by mouth 2 (two) times daily. 60 tablet 11  . doxazosin (CARDURA) 2 MG tablet Take 1 tablet (2 mg total) by mouth at bedtime. 30 tablet 4  . DULoxetine (CYMBALTA) 30 MG capsule Take 1 capsule (30 mg total) by mouth daily. 90 capsule 3  . ferrous sulfate 325 (65 FE) MG tablet Take 1 tablet (325 mg total) by mouth 2 (two) times daily with a meal. 60 tablet 5  . lisinopril (PRINIVIL,ZESTRIL) 5 MG tablet Take 1 tablet (5 mg total) by mouth daily. 90 tablet 3  . loperamide (IMODIUM) 2 MG capsule Take 1 capsule (2 mg total) by mouth as needed for diarrhea or loose stools. 20 capsule 0  . losartan (COZAAR) 25 MG tablet Take 25 mg by mouth daily.    . rosuvastatin (CRESTOR) 10 MG tablet Take 1 tablet (10 mg total) by mouth daily at 6 PM. 30 tablet 11  . vitamin B-12 (CYANOCOBALAMIN) 1000 MCG tablet Take 1,000 mcg by mouth daily.    . nitroGLYCERIN (NITROSTAT) 0.4 MG SL tablet Place 1 tablet (0.4 mg total) under the tongue every 5 (five) minutes x 3 doses as needed for chest pain. 25 tablet 12   No current facility-administered medications for this visit.     Allergies as of 01/02/2018 - Review Complete 01/02/2018  Allergen Reaction Noted  . Allopurinol Nausea Only 08/10/2013  . Lipitor [atorvastatin calcium] Other (See Comments) 03/31/2011  . Morphine and related Other (See Comments) 10/15/2017  . Simvastatin Other (See Comments) 07/29/2008  . Tape Itching and Rash 06/30/2012    Vitals: Ht 5\' 2"  (1.575 m)   Wt  199 lb (90.3 kg)   BMI 36.40 kg/m  Last Weight:  Wt Readings from Last 1 Encounters:  01/02/18 199 lb (90.3 kg)   Last Height:   Ht Readings from Last 1 Encounters:  01/02/18 5\' 2"  (1.575 m)    Physical exam: Exam: Gen: NAD, conversant, well nourised, obese, well groomed                     CV: RRR, no MRG. No Carotid Bruits. Mild peripheral edema, warm, nontender Eyes: Conjunctivae clear without exudates or hemorrhage  Neuro: Detailed Neurologic Exam  Speech:    Speech is normal; fluent and spontaneous with normal comprehension.  Cognition:    The patient is oriented to person, place, and time;     recent and remote memory slight impaired;     language fluent;     Impaired attention, concentration, fund of knowledge Cranial Nerves:    The pupils are equal, round, and reactive to light. Attempted fundoscopy could not visualize.. Visual fields are full to finger confrontation. Extraocular movements are intact. Trigeminal sensation is intact and the muscles of mastication are normal. The face is symmetric. The palate elevates in the midline. Hearing intact to voice. Voice is normal. Shoulder shrug is normal. The tongue has normal motion without fasciculations.   Coordination:    No dysmetria   Gait:    Wide based  Motor Observation: Patient becomes dizzy on standing    No asymmetry, no atrophy, and no involuntary movements noted. Tone:    Normal muscle tone.    Posture:    Posture is normal. normal erect  Strength:    Mild generalized weakness prox > distal, moreso in left hip flexion 3/5.      Sensation: intact to LT, decreased distally to all modalities in the LE     Reflex Exam:  DTR's: Absent lowers, hypo uppers but symmetric    Toes:    The toes are equiv bilaterally.   Clonus:    Clonus is absent.    BP  97/57  92/49  112/64    BP Location  LeftArm  LeftArm  LeftArm   Patient Position  Sitting  Standing  Sitting   Pulse  68  68  63         Assessment/Plan:  84 y.o. female here as requested by Dr. Glori Bickers for dizziness. Here with daughter who provides much information. PMHx hypothyroid, HTN, HLD, CAD, CKD, SDH after a fall.  Dizziness when standing: Patient is orthostatic in the office drops 20 systolic from sitting to standing, she is dizzy when she stands from chair. She is on multiple BP meds, f/u with Dr. Martinique to manage medicattions  Occipital neuralgia: Since fall, likely due to whiplash and cervical degenerative disease affecting the occipital nerves. Needs PT. If continues recommend nerve blocks  Falls: PT for gait abnormality  PT for neck pain and balance, falls - discussed minimizing risks including using walking aids  Orders Placed This Encounter  Procedures  . Ambulatory referral to [place] [address] [place], [place] [address]  Phone [phone] Fax [phone]

## 2018-01-03 ENCOUNTER — Encounter: Payer: Self-pay | Admitting: Neurology

## 2018-01-03 DIAGNOSIS — I951 Orthostatic hypotension: Secondary | ICD-10-CM | POA: Insufficient documentation

## 2018-01-03 NOTE — Progress Notes (Signed)
Cardiology Office Note    Date:  01/04/2018   ID:  Valerie Ramirez, DOB 08/18/1932, MRN [id number]  PCP:  Abner Greenspan, MD  Cardiologist:  Dr. Martinique  Chief Complaint  Patient presents with  . Chest Pain  . Dizziness    History of Present Illness:  Valerie Ramirez is a 84 y.o. female with PMH of HTN, HLD, hypothyroidism and CAD.  Echocardiogram obtained on 04/05/2017 showed EF 55-60%, grade 2 DD, PA peak pressure 34 mmHg.  She was ruled in for NSTEMI.  She eventually underwent cardiac catheterization on 04/06/2017 which showed a 95% mid left circumflex lesion treated with 3.5 x 28 mm DES, there was a 40% ostial to proximal LAD residual and to 60% proximal RCA residual.  Post procedure, patient was placed on dual antiplatelet therapy.  Due to her intolerance of Lipitor and simvastatin, she was started on low-dose Crestor.    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 Bridgett Larsson on 04/26/2017.  Daughter at the time also mentioned some mental status change immediately after the discharge, they did not seek medical attention at the time.    MRI unfortunately does show punctate acute/early subacute microvascular infarct in the right anterior caudate body, chronic right occipital infarction, small chronic lacunar infarct in the right putamen extending into the mid corona radiata.  .  She was admitted in June 2019 after a fall and facial trauma with nasal fracture and subdural hematoma. DAPT was held. Later resumed as outpatient and repeat CT showed resolution of hematoma.   She is seen with her daughter today. Notes multiple falling episodes over the past few weeks. Stays she gets lightheaded, her legs buckle and she falls back. Doesn't really pass out. Daughter notes BP running low at home. The only chest pain she gets is when she lies flat. No SOB. She has lost 45 lbs. "nothing tastes good". Ankles have  Been  swelling intermittently.   Past Medical History:  Diagnosis Date  . Arthritis    OA  . CAD (coronary artery disease)    stent December 2018  . Cancer ([place])    endometrial  . GERD (gastroesophageal reflux disease)   . Hearing loss   . History of kidney stones   . Hyperlipidemia   . Hypertension   . Hypothyroid    "not in years"  . Shortness of breath dyspnea    04/25/17- not anymore    Past Surgical History:  Procedure Laterality Date  . ABDOMINAL HYSTERECTOMY    . ANTERIOR CERVICAL DECOMP/DISCECTOMY FUSION [name] 08/18/2015   Procedure: C5-6, C6-7 Anterior Cervical Discectomy and Fusion, Allograft, Plate;  Surgeon: Marybelle Killings, MD;  Location: [place];  Service: Orthopedics;  Laterality: [name];  . bone spur removed Right    shoulder  . BOWEL RESECTION  07/04/2012   Procedure: SMALL BOWEL RESECTION;  Surgeon: Alvino Chapel, MD;  [facility];  Service: Gynecology;;  . BREAST BIOPSY    . CHOLECYSTECTOMY    . CORONARY STENT INTERVENTION [name] 04/06/2017   Procedure: CORONARY STENT INTERVENTION;  Surgeon: Wellington Hampshire, MD;  Location: [facility];  Service: Cardiovascular;  Laterality: [name];  . DILATION AND CURETTAGE OF UTERUS  1999  . EYE SURGERY Bilateral    cataracts  . 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 HAND SURGERY  12/2008   after hand fx/due to fall  . JOINT REPLACEMENT Bilateral 07/1998   knees  . KNEE ARTHROPLASTY  05/23/1998   total right  . LAPAROTOMY Bilateral 07/04/2012   Procedure: EXPLORATORY LAPAROTOMY TOTAL ABDOMINAL HYSTERECTOMY BILATERAL SALPINGO-OOPHORECTOMY with small bowel resection ;  Surgeon: Alvino Chapel, MD;  [facility];  Service: Gynecology;  Laterality: Bilateral;  . LEFT HEART CATH AND CORONARY ANGIOGRAPHY [name] 04/06/2017   Procedure: LEFT HEART CATH AND CORONARY  ANGIOGRAPHY;  Surgeon: Jolaine Artist, MD;  Location: [facility];  Service: Cardiovascular;  Laterality: [name];  . polyp removal  1999  . TUBAL LIGATION      Current Medications: Outpatient Medications Prior to Visit  Medication Sig Dispense Refill  . acetaminophen (TYLENOL) 650 MG CR tablet Take 650 mg by mouth every 8 (eight) hours as needed for pain.    [name] amLODipine (NORVASC) 10 MG tablet Take 1 tablet (10 mg total) by mouth daily. 30 tablet 11  . carvedilol (COREG) 25 MG tablet Take 1 tablet (25 mg total) by mouth 2 (two) times daily with a meal. 60 tablet 11  . Cholecalciferol (VITAMIN D PO) Take by mouth.    . clopidogrel (PLAVIX) 75 MG tablet Take 75 mg by mouth daily.    . colchicine 0.6 MG tablet Take 1 tablet (0.6 mg total) by mouth 2 (two) times daily. 60 tablet 11  . doxazosin (CARDURA) 2 MG tablet Take 1 tablet (2 mg total) by mouth at bedtime. 30 tablet 4  . DULoxetine (CYMBALTA) 30 MG capsule Take 1 capsule (30 mg total) by mouth daily. 90 capsule 3  . ferrous sulfate 325 (65 FE) MG tablet Take 1 tablet (325 mg total) by mouth 2 (two) times daily with a meal. 60 tablet 5  . lisinopril (PRINIVIL,ZESTRIL) 5 MG tablet Take 1 tablet (5 mg total) by mouth daily. 90 tablet 3  . loperamide (IMODIUM) 2 MG capsule Take 1 capsule (2 mg total) by mouth as needed for diarrhea or loose stools. 20 capsule 0  . losartan (COZAAR) 25 MG tablet Take 25 mg by mouth daily.    . nitroGLYCERIN (NITROSTAT) 0.4 MG SL tablet Place 1 tablet (0.4 mg total) under the tongue every 5 (five) minutes x 3 doses as needed for chest pain. 25 tablet 12  . rosuvastatin (CRESTOR) 10 MG tablet Take 1 tablet (10 mg total) by mouth daily at 6 PM. 30 tablet 11  . vitamin B-12 (CYANOCOBALAMIN) 1000 MCG tablet Take 1,000 mcg by mouth daily.     No facility-administered medications prior to visit.      Allergies:   Allopurinol; Lipitor [atorvastatin calcium]; Morphine and related; Simvastatin; and Tape    Social History   Socioeconomic History  . Marital status: Widowed    Spouse name: Not on file  . Number of children: 2  . Years of education: Not on file  . Highest education level: High school graduate  Occupational History  . Not on file  Social Needs  . Financial resource strain: Not on file  . Food insecurity:    Worry: Not on file    Inability: Not on file  . Transportation needs:    Medical: Not on file    Non-medical: Not on file  Tobacco Use  . Smoking status: Never Smoker  . Smokeless tobacco: Never Used  Substance and Sexual Activity  .  Alcohol use: Never    Alcohol/week: 0.0 standard drinks    Frequency: Never  . Drug use: Never  . Sexual activity: Never    Birth control/protection: Post-menopausal  Lifestyle  . Physical activity:    Days per week: Not on file    Minutes per session: Not on file  . Stress: Not on file  Relationships  . Social connections:    Talks on phone: Not on file    Gets together: Not on file    Attends religious service: Not on file    Active member of club or organization: Not on file    Attends meetings of clubs or organizations: Not on file    Relationship status: Not on file  Other Topics Concern  . Not on file  Social History Narrative  . Not on file     Family History:  The patient's family history includes Bladder Cancer in her sister; Cancer in her brother, brother, brother, and sister; Heart disease in her brother, father, and sister; Stroke in her sister and sister.   ROS:   Please see the history of present illness.    ROS All other systems reviewed and are negative.   PHYSICAL EXAM:   VS:  BP (!) 129/59   Pulse 69   Ht 5\' 2"  (1.575 m)   Wt 198 lb 9.6 oz (90.1 kg)   BMI 36.32 kg/m     Patient is orthostatic with sitting and standing to 97/50. Symptomatic.  GENERAL:  Well appearing obese WF in NAD HEENT:  PERRL, EOMI, sclera are clear. Oropharynx is clear. NECK:  No jugular venous distention, carotid  upstroke brisk and symmetric, no bruits, no thyromegaly or adenopathy LUNGS:  Clear to auscultation bilaterally CHEST:  Unremarkable HEART:  RRR,  PMI not displaced or sustained,S1 and S2 within normal limits, no S3, no S4: no clicks, no rubs, no murmurs ABD:  Soft, nontender. BS +, no masses or bruits. No hepatomegaly, no splenomegaly EXT:  2 + pulses throughout, tr edema, no cyanosis no clubbing SKIN:  Warm and dry.  No rashes NEURO:  Alert and oriented x 3. Cranial nerves II through XII intact. PSYCH:  Cognitively intact    Wt Readings from Last 3 Encounters:  01/04/18 198 lb 9.6 oz (90.1 kg)  01/02/18 199 lb (90.3 kg)  12/26/17 193 lb (87.5 kg)      Studies/Labs Reviewed:   EKG:  EKG is not ordered today.   Recent Labs: 07/27/2017: TSH 2.61 12/19/2017: ALT 41; BUN 34; Creatinine, Ser 1.79; Hemoglobin 8.5; Platelets 217; Potassium 3.8; Sodium 137   Lipid Panel    Component Value Date/Time   CHOL 121 07/27/2017 1118   CHOL 123 05/18/2017 1154   TRIG 153.0 (H) 07/27/2017 1118   HDL 49.70 07/27/2017 1118   HDL 52 05/18/2017 1154   CHOLHDL 2 07/27/2017 1118   VLDL 30.6 07/27/2017 1118   LDLCALC 41 07/27/2017 1118   LDLCALC 28 05/18/2017 1154   LDLDIRECT 136.0 10/31/2015 0959    Additional studies/ records that were reviewed today include:   Cath 04/06/2017 Conclusion     Mid Cx lesion is 95% stenosed.  A drug-eluting stent was successfully placed using a STENT SVELTE RX 3.50 X 28MM.  Post intervention, there is a 0% residual stenosis.  Successful angioplasty and drug-eluting stent placement to the mid left circumflex.    Echo 04/05/2017 LV EF: 55% - 60%  Study Conclusions  - Left ventricle: The cavity size was normal. Wall  thickness was increased in a pattern of severe LVH. Systolic function was normal. The estimated ejection fraction was in the range of 55% to 60%. Wall motion was normal; there were no regional wall motion  abnormalities. Doppler parameters are consistent with pseudonormal left ventricular relaxation (grade 2 diastolic dysfunction). The E/e&' ratio is >15, suggesting elevated LV filling pressure. - Aortic valve: Sclerosis without stenosis. Transvalvular velocity was minimally increased. Mean gradient (S): 7 mm Hg. - Mitral valve: Mildly thickened leaflets . There was trivial regurgitation. - Left atrium: Moderately dilated. - Tricuspid valve: There was mild regurgitation. - Pulmonary arteries: PA peak pressure: 34 mm Hg (S). - Inferior vena cava: The vessel was normal in size. The respirophasic diameter changes were in the normal range (>= 50%), consistent with normal central venous pressure.  Impressions:  - LVEF 55-60%, severe LVH, normal wall motion, grade 2 DD with elevated LV filling pressure, aortic valve sclerosis, trivial MR, moderate LAE, mild TR, RVSP 34 mmHg, normal IVC.    ASSESSMENT:    1. Coronary artery disease involving native coronary artery of native heart without angina pectoris   2. Essential hypertension   3. Mixed hyperlipidemia   4. Orthostatic hypotension      PLAN:  In order of problems listed above:  1. CAD: s/p DES of LCx for NSTEMI in Dec. 2018. Continue aspirin, Plavix, carvedilol, and Crestor. Will be able to stop Plavix in mid December.  2. Hyperlipidemia: On Crestor 10 mg and tolerating well. Lipids look excellent.   3. Hypertension: Blood pressure is well controlled but she is orthostatic and symptomatic with this increasing risk of falls. Recommend stopping Cardura, reduce amlodipine to 5 mg daily. Will arrange follow up in 3 months. If orthostasis persists may need to reduce meds further.   4.    CVA: Confirmed on MRI, noted by daughter to have some altered mental status after last cardiac catheterization.  However MRI also showed some chronic infarct as well in the occipital lobe.  Clinically improved.  5.    Hypothyroidism: Managed by primary care provider.    6.   Recurrent falls with nasal fracture and subdural hematoma- resolved.     Medication Adjustments/Labs and Tests Ordered: Current medicines are reviewed at length with the patient today.  Concerns regarding medicines are outlined above.  Medication changes, Labs and Tests ordered today are listed in the Patient Instructions below. Patient Instructions  Stop Doxazosin (Cardura)..  Reduce amlodipine to 5 mg daily.  Continue your other therapy  Stop taking Plavix in Mid December.  Will arrange follow up in 3 months.        Signed, Peter Martinique, MD  01/04/2018 2:23 PM    [address], [place], [place]  [address] Phone: [phone]; Fax: [phone]

## 2018-01-04 ENCOUNTER — Encounter: Payer: Self-pay | Admitting: Cardiology

## 2018-01-04 ENCOUNTER — Ambulatory Visit (INDEPENDENT_AMBULATORY_CARE_PROVIDER_SITE_OTHER): Payer: Medicare Other | Admitting: Cardiology

## 2018-01-04 VITALS — BP 129/59 | HR 69 | Ht 62.0 in | Wt 198.6 lb

## 2018-01-04 DIAGNOSIS — I951 Orthostatic hypotension: Secondary | ICD-10-CM | POA: Diagnosis not present

## 2018-01-04 DIAGNOSIS — I2510 Atherosclerotic heart disease of native coronary artery without angina pectoris: Secondary | ICD-10-CM | POA: Diagnosis not present

## 2018-01-04 DIAGNOSIS — I10 Essential (primary) hypertension: Secondary | ICD-10-CM

## 2018-01-04 DIAGNOSIS — E782 Mixed hyperlipidemia: Secondary | ICD-10-CM

## 2018-01-04 NOTE — Patient Instructions (Addendum)
Stop Doxazosin (Cardura)..  Reduce amlodipine to 5 mg daily.  Continue your other therapy  Stop taking Plavix in Mid December.  Will arrange follow up in 3 months.

## 2018-01-05 DIAGNOSIS — S134XXD Sprain of ligaments of cervical spine, subsequent encounter: Secondary | ICD-10-CM | POA: Diagnosis not present

## 2018-01-05 DIAGNOSIS — I951 Orthostatic hypotension: Secondary | ICD-10-CM | POA: Diagnosis not present

## 2018-01-05 DIAGNOSIS — Z23 Encounter for immunization: Secondary | ICD-10-CM | POA: Diagnosis not present

## 2018-01-11 DIAGNOSIS — S134XXD Sprain of ligaments of cervical spine, subsequent encounter: Secondary | ICD-10-CM | POA: Diagnosis not present

## 2018-01-11 DIAGNOSIS — I951 Orthostatic hypotension: Secondary | ICD-10-CM | POA: Diagnosis not present

## 2018-01-12 DIAGNOSIS — I951 Orthostatic hypotension: Secondary | ICD-10-CM | POA: Diagnosis not present

## 2018-01-12 DIAGNOSIS — S134XXD Sprain of ligaments of cervical spine, subsequent encounter: Secondary | ICD-10-CM | POA: Diagnosis not present

## 2018-01-17 DIAGNOSIS — I951 Orthostatic hypotension: Secondary | ICD-10-CM | POA: Diagnosis not present

## 2018-01-17 DIAGNOSIS — S134XXD Sprain of ligaments of cervical spine, subsequent encounter: Secondary | ICD-10-CM | POA: Diagnosis not present

## 2018-01-18 ENCOUNTER — Telehealth: Payer: Self-pay | Admitting: *Deleted

## 2018-01-18 NOTE — Telephone Encounter (Signed)
PT plan of care forms signed by Dr. Jaynee Eagles and faxed back to [facility]. Received a receipt of confirmation.

## 2018-01-19 DIAGNOSIS — S134XXD Sprain of ligaments of cervical spine, subsequent encounter: Secondary | ICD-10-CM | POA: Diagnosis not present

## 2018-01-19 DIAGNOSIS — I951 Orthostatic hypotension: Secondary | ICD-10-CM | POA: Diagnosis not present

## 2018-01-24 DIAGNOSIS — S134XXD Sprain of ligaments of cervical spine, subsequent encounter: Secondary | ICD-10-CM | POA: Diagnosis not present

## 2018-01-24 DIAGNOSIS — I951 Orthostatic hypotension: Secondary | ICD-10-CM | POA: Diagnosis not present

## 2018-01-26 DIAGNOSIS — I951 Orthostatic hypotension: Secondary | ICD-10-CM | POA: Diagnosis not present

## 2018-01-26 DIAGNOSIS — S134XXD Sprain of ligaments of cervical spine, subsequent encounter: Secondary | ICD-10-CM | POA: Diagnosis not present

## 2018-01-30 ENCOUNTER — Ambulatory Visit (INDEPENDENT_AMBULATORY_CARE_PROVIDER_SITE_OTHER): Payer: Medicare Other | Admitting: Family Medicine

## 2018-01-30 ENCOUNTER — Encounter: Payer: Self-pay | Admitting: Family Medicine

## 2018-01-30 VITALS — BP 124/72 | HR 73 | Temp 97.5°F | Ht 62.0 in | Wt 196.2 lb

## 2018-01-30 DIAGNOSIS — F0781 Postconcussional syndrome: Secondary | ICD-10-CM | POA: Diagnosis not present

## 2018-01-30 DIAGNOSIS — N183 Chronic kidney disease, stage 3 unspecified: Secondary | ICD-10-CM

## 2018-01-30 DIAGNOSIS — I2510 Atherosclerotic heart disease of native coronary artery without angina pectoris: Secondary | ICD-10-CM

## 2018-01-30 DIAGNOSIS — D631 Anemia in chronic kidney disease: Secondary | ICD-10-CM

## 2018-01-30 DIAGNOSIS — I10 Essential (primary) hypertension: Secondary | ICD-10-CM

## 2018-01-30 DIAGNOSIS — R42 Dizziness and giddiness: Secondary | ICD-10-CM | POA: Diagnosis not present

## 2018-01-30 DIAGNOSIS — E669 Obesity, unspecified: Secondary | ICD-10-CM | POA: Diagnosis not present

## 2018-01-30 DIAGNOSIS — R739 Hyperglycemia, unspecified: Secondary | ICD-10-CM | POA: Diagnosis not present

## 2018-01-30 DIAGNOSIS — N189 Chronic kidney disease, unspecified: Secondary | ICD-10-CM | POA: Diagnosis not present

## 2018-01-30 DIAGNOSIS — N179 Acute kidney failure, unspecified: Secondary | ICD-10-CM

## 2018-01-30 DIAGNOSIS — I951 Orthostatic hypotension: Secondary | ICD-10-CM | POA: Diagnosis not present

## 2018-01-30 LAB — RENAL FUNCTION PANEL
Albumin: 3.8 g/dL (ref 3.5–5.2)
BUN: 20 mg/dL (ref 6–23)
CO2: 28 mEq/L (ref 19–32)
Calcium: 9.1 mg/dL (ref 8.4–10.5)
Chloride: 107 mEq/L (ref 96–112)
Creatinine, Ser: 1.43 mg/dL — ABNORMAL HIGH (ref 0.40–1.20)
GFR: 37.2 mL/min — ABNORMAL LOW (ref >60.00)
Glucose, Bld: 142 mg/dL — ABNORMAL HIGH (ref 70–99)
Phosphorus: 4.7 mg/dL — ABNORMAL HIGH (ref 2.3–4.6)
Potassium: 4.2 mEq/L (ref 3.5–5.1)
Sodium: 141 mEq/L (ref 135–145)

## 2018-01-30 LAB — CBC WITH DIFFERENTIAL/PLATELET
Basophils Absolute: 0 10*3/uL (ref 0.0–0.1)
Basophils Relative: 1.1 % (ref 0.0–3.0)
Eosinophils Absolute: 0.1 10*3/uL (ref 0.0–0.7)
Eosinophils Relative: 1.3 % (ref 0.0–5.0)
HCT: 29.3 % — ABNORMAL LOW (ref 36.0–46.0)
Hemoglobin: 9.6 g/dL — ABNORMAL LOW (ref 12.0–15.0)
Lymphocytes Relative: 26.5 % (ref 12.0–46.0)
Lymphs Abs: 1.2 10*3/uL (ref 0.7–4.0)
MCHC: 32.7 g/dL (ref 30.0–36.0)
MCV: 90.2 fl (ref 78.0–100.0)
Monocytes Absolute: 0.6 10*3/uL (ref 0.1–1.0)
Monocytes Relative: 12.9 % — ABNORMAL HIGH (ref 3.0–12.0)
Neutro Abs: 2.5 10*3/uL (ref 1.4–7.7)
Neutrophils Relative %: 58.2 % (ref 43.0–77.0)
Platelets: 164 10*3/uL (ref 150.0–400.0)
RBC: 3.25 Mil/uL — ABNORMAL LOW (ref 3.87–5.11)
RDW: 23.5 % — ABNORMAL HIGH (ref 11.5–15.5)
WBC: 4.4 10*3/uL (ref 4.0–10.5)

## 2018-01-30 LAB — HEMOGLOBIN A1C: Hgb A1c MFr Bld: 5 % (ref 4.6–6.5)

## 2018-01-30 LAB — FERRITIN: Ferritin: 33.2 ng/mL (ref 10.0–291.0)

## 2018-01-30 NOTE — Assessment & Plan Note (Signed)
Re check cbc today  Low HB in ED prev  May be due to CKD She is also taking iron -check ferritin

## 2018-01-30 NOTE — Assessment & Plan Note (Signed)
Inc in renal labs when she had UTI (hospital)  Re check today s/p tx of uti and inc water intake Also feeling better ? If causing anemia

## 2018-01-30 NOTE — Assessment & Plan Note (Signed)
A1C today  disc imp of low glycemic diet and wt loss to prevent DM2  

## 2018-01-30 NOTE — Assessment & Plan Note (Signed)
With last uti  Now feeling better  Labs today

## 2018-01-30 NOTE — Assessment & Plan Note (Signed)
Discussed how this problem influences overall health and the risks it imposes  Reviewed plan for weight loss with lower calorie diet (via better food choices and also portion control or program like weight watchers) and exercise building up to or more than 30 minutes 5 days per week including some aerobic activity    

## 2018-01-30 NOTE — Progress Notes (Signed)
Subjective:    Patient ID: Valerie Ramirez, female    DOB: 12/27/1932, 84 y.o.   MRN: [id number]  HPI Here for f/u of chronic health problems    Wt Readings from Last 3 Encounters:  01/30/18 196 lb 4 oz (89 kg)  01/04/18 198 lb 9.6 oz (90.1 kg)  01/02/18 199 lb (90.3 kg)  down a few lb  Appetite is ok - eats what she want  35.89 kg/m    Last cardiology visit last month - low bp/orthostatic Did stop cardura and reduce amlodipine to 5 mg daily  Dizziness is gone!  No more falling   Saw neuro Dr Jaynee Eagles for her concussion symptoms  diag with ortho hypotension  Also occipital neuralgia  PT was ordered  (for dizziness/pain and balance) -helped her /is already done  Considered nerve block  Now is much better    CKD Lab Results  Component Value Date   CREATININE 1.79 (H) 12/19/2017   BUN 34 (H) 12/19/2017   NA 137 12/19/2017   K 3.8 12/19/2017   CL 109 12/19/2017   CO2 17 (L) 12/19/2017  had worsened from uti prev Does not feel like she has a uti  Water intake - much better  Needs re check   No more diarrhea   Anemia  Lab Results  Component Value Date   WBC 4.8 12/19/2017   HGB 8.5 (L) 12/19/2017   HCT 28.2 (L) 12/19/2017   MCV 94.0 12/19/2017   PLT 217 12/19/2017    bp is stable today  No cp or palpitations or headaches or edema  No side effects to medicines  BP Readings from Last 3 Encounters:  01/30/18 124/72  01/04/18 (!) 129/59  12/26/17 (!) 128/58     Lab Results  Component Value Date   TSH 2.61 07/27/2017     Prediabetes Lab Results  Component Value Date   HGBA1C 6.2 07/27/2017  does not watch diet all the time but did cut back sweets    Patient Active Problem List   Diagnosis Date Noted  . Orthostatic hypotension 01/03/2018  . Diarrhea 12/26/2017  . Dizziness 12/26/2017  . UTI (urinary tract infection) 12/26/2017  . Post concussive syndrome 10/29/2017  . Depression 10/26/2017  . H/O subdural hemorrhage 09/18/2017  . CAD (coronary  artery disease) 09/18/2017  . Closed fracture of nasal bones   . Epistaxis   . Elevated transaminase level 08/03/2017  . Radial artery injury, left, sequela 06/15/2017  . H/O: CVA (cerebrovascular accident) 05/26/2017  . Sick sinus syndrome ([place]) 05/25/2017  . Unstable angina ([place])   . Exertional chest pain 04/05/2017  . CKD (chronic kidney disease), stage III ([place]) 04/05/2017  . Anemia 04/05/2017  . Thoracic back pain 09/15/2016  . History of radius fracture 06/01/2016  . AKI (acute kidney injury) ([place]) 04/25/2016  . Status post cervical spinal fusion 08/18/2015  . Osteoarthritis, hip, bilateral 08/12/2014  . Hearing loss in right ear 06/12/2014  . Colon cancer screening 08/10/2013  . Seborrheic keratoses, inflamed 08/10/2013  . History of fall 04/10/2013  . Encounter for Medicare annual wellness exam 08/08/2012  . Gout 07/14/2012  . History of endometrial cancer 05/25/2012  . Degenerative joint disease of cervical spine 05/03/2011  . Other screening mammogram 03/31/2011  . Hyperglycemia 09/26/2007  . Obesity (BMI 30-39.9) 07/12/2007  . Essential hypertension 07/11/2007  . History of cardiomyopathy 07/11/2007  . Osteoarthritis 07/11/2007  . 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 Hypothyroidism 10/05/2006  .  HYPERCHOLESTEROLEMIA, PURE 10/05/2006   Past Medical History:  Diagnosis Date  . Arthritis    OA  . CAD (coronary artery disease)    stent December 2018  . Cancer ([place])    endometrial  . GERD (gastroesophageal reflux disease)   . Hearing loss   . History of kidney stones   . Hyperlipidemia   . Hypertension   . Hypothyroid    "not in years"  . Shortness of breath dyspnea    04/25/17- not anymore   Past Surgical History:  Procedure Laterality Date  . ABDOMINAL HYSTERECTOMY    . ANTERIOR CERVICAL DECOMP/DISCECTOMY FUSION [name] 08/18/2015   Procedure: C5-6, C6-7 Anterior Cervical Discectomy and Fusion, Allograft, Plate;  Surgeon: Marybelle Killings, MD;   Location: [facility];  Service: Orthopedics;  Laterality: [name];  . bone spur removed Right    shoulder  . BOWEL RESECTION  07/04/2012   Procedure: SMALL BOWEL RESECTION;  Surgeon: Alvino Chapel, MD;  Location: [facility];  Service: Gynecology;;  . BREAST BIOPSY    . CHOLECYSTECTOMY    . CORONARY STENT INTERVENTION [name] 04/06/2017   Procedure: CORONARY STENT INTERVENTION;  Surgeon: Wellington Hampshire, MD;  Location: [facility];  Service: Cardiovascular;  Laterality: [name];  . DILATION AND CURETTAGE OF UTERUS  1999  . EYE SURGERY Bilateral    cataracts  . FALSE ANEURYSM REPAIR Right 04/26/2017   Procedure: 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;  Surgeon: Conrad Blair, MD;  Location: [facility];  Service: Vascular;  Laterality: Right;  . HAND SURGERY  12/2008   after hand fx/due to fall  . JOINT REPLACEMENT Bilateral 07/1998   knees  . KNEE ARTHROPLASTY  05/23/1998   total right  . LAPAROTOMY Bilateral 07/04/2012   Procedure: EXPLORATORY LAPAROTOMY TOTAL ABDOMINAL HYSTERECTOMY BILATERAL SALPINGO-OOPHORECTOMY with small bowel resection ;  Surgeon: Alvino Chapel, MD;  Location: [facility];  Service: Gynecology;  Laterality: Bilateral;  . LEFT HEART CATH AND CORONARY ANGIOGRAPHY [name] 04/06/2017   Procedure: LEFT HEART CATH AND CORONARY ANGIOGRAPHY;  Surgeon: Jolaine Artist, MD;  Location: [facility];  Service: Cardiovascular;  Laterality: [name];  . polyp removal  1999  . TUBAL LIGATION     Social History   Tobacco Use  . Smoking status: Never Smoker  . Smokeless tobacco: Never Used  Substance Use Topics  . Alcohol use: Never    Alcohol/week: 0.0 standard drinks    Frequency: Never  . Drug use: Never   Family History  Problem Relation Age of Onset  . Heart disease Father   . Cancer Brother        LEUKEMIA  . Cancer Brother        LUNG  . Cancer Brother        STOMACH  . Heart disease Sister        CABG  . Stroke Sister   . Cancer Sister   . Bladder  Cancer Sister   . Heart disease Brother        CABG  . Stroke Sister        "massive brain bleed"   Allergies  Allergen Reactions  . Allopurinol Nausea Only  . Lipitor [Atorvastatin Calcium] Other (See Comments)    Leg aches and aching all over  . Morphine And Related Other (See Comments)    Makes the patient "loopy,"   . Simvastatin Other (See Comments)    MUSCLE PAIN  . Tape Itching and Rash    "  Cannot use paper tape" --- also causes blisters. Use plastic tape   Current Outpatient Medications on File Prior to Visit  Medication Sig Dispense Refill  . acetaminophen (TYLENOL) 650 MG CR tablet Take 650 mg by mouth every 8 (eight) hours as needed for pain.    [name] amLODipine (NORVASC) 5 MG tablet Take 5 mg by mouth daily.    . carvedilol (COREG) 25 MG tablet Take 1 tablet (25 mg total) by mouth 2 (two) times daily with a meal. 60 tablet 11  . Cholecalciferol (VITAMIN D PO) Take by mouth.    . clopidogrel (PLAVIX) 75 MG tablet Take 75 mg by mouth daily.    . colchicine 0.6 MG tablet Take 1 tablet (0.6 mg total) by mouth 2 (two) times daily. 60 tablet 11  . DULoxetine (CYMBALTA) 30 MG capsule Take 1 capsule (30 mg total) by mouth daily. 90 capsule 3  . ferrous sulfate 325 (65 FE) MG tablet Take 1 tablet (325 mg total) by mouth 2 (two) times daily with a meal. 60 tablet 5  . lisinopril (PRINIVIL,ZESTRIL) 5 MG tablet Take 1 tablet (5 mg total) by mouth daily. 90 tablet 3  . loperamide (IMODIUM) 2 MG capsule Take 1 capsule (2 mg total) by mouth as needed for diarrhea or loose stools. 20 capsule 0  . losartan (COZAAR) 25 MG tablet Take 25 mg by mouth daily.    . nitroGLYCERIN (NITROSTAT) 0.4 MG SL tablet Place 1 tablet (0.4 mg total) under the tongue every 5 (five) minutes x 3 doses as needed for chest pain. 25 tablet 12  . rosuvastatin (CRESTOR) 10 MG tablet Take 1 tablet (10 mg total) by mouth daily at 6 PM. 30 tablet 11  . vitamin B-12 (CYANOCOBALAMIN) 1000 MCG tablet Take 1,000 mcg by mouth  daily.     No current facility-administered medications on file prior to visit.     Review of Systems  Constitutional: Negative for activity change, appetite change, fatigue, fever and unexpected weight change.  HENT: Negative for congestion, ear pain, rhinorrhea, sinus pressure and sore throat.   Eyes: Negative for pain, redness and visual disturbance.  Respiratory: Negative for cough, shortness of breath and wheezing.   Cardiovascular: Negative for chest pain and palpitations.  Gastrointestinal: Negative for abdominal pain, blood in stool, constipation and diarrhea.  Endocrine: Negative for polydipsia and polyuria.  Genitourinary: Negative for dysuria, frequency and urgency.  Musculoskeletal: Negative for arthralgias, back pain and myalgias.  Skin: Negative for pallor and rash.  Allergic/Immunologic: Negative for environmental allergies.  Neurological: Negative for dizziness, syncope and headaches.  Hematological: Negative for adenopathy. Does not bruise/bleed easily.  Psychiatric/Behavioral: Negative for decreased concentration and dysphoric mood. The patient is not nervous/anxious.        Objective:   Physical Exam  Constitutional: She appears well-developed and well-nourished. No distress.  obese and well appearing   HENT:  Head: Normocephalic and atraumatic.  Mouth/Throat: Oropharynx is clear and moist.  Eyes: Pupils are equal, round, and reactive to light. Conjunctivae and EOM are normal.  Neck: Normal range of motion. Neck supple. No JVD present. Carotid bruit is not present. No thyromegaly present.  Cardiovascular: Normal rate, regular rhythm, normal heart sounds and intact distal pulses. Exam reveals no gallop.  Pulmonary/Chest: Effort normal and breath sounds normal. No respiratory distress. She has no wheezes. She has no rales.  No crackles  Abdominal: Soft. Bowel sounds are normal. She exhibits no distension, no abdominal bruit and no mass. There is no  tenderness.    Musculoskeletal: She exhibits no edema or tenderness.  Lymphadenopathy:    She has no cervical adenopathy.  Neurological: She is alert. She has normal reflexes. She displays normal reflexes. No cranial nerve deficit. Coordination normal.  Skin: Skin is warm and dry. No rash noted.  Psychiatric: She has a normal mood and affect.  Good mood Overall feeling much better           Assessment & Plan:   Problem List Items Addressed This Visit      Cardiovascular and Mediastinum   Essential hypertension    bp in fair control at this time  BP Readings from Last 1 Encounters:  01/30/18 124/72   No changes needed Most recent labs reviewed  Disc lifstyle change with low sodium diet and exercise  Doing fine after d/c of cardura and dec of amlodipine dose        Relevant Medications   amLODipine (NORVASC) 5 MG tablet   Other Relevant Orders   CBC with Differential/Platelet   Renal function panel   RESOLVED: Orthostatic hypotension    Much improved after cardiology d/c cardura and cut amlodipine  Feels back to normal Re assuring      Relevant Medications   amLODipine (NORVASC) 5 MG tablet     Nervous and Auditory   RESOLVED: Post concussive syndrome    Resolved No longer HA or dizziness  Reassuring         Genitourinary   AKI (acute kidney injury) ([place])    With last uti  Now feeling better  Labs today       CKD (chronic kidney disease), stage III ([place]) - Primary    Inc in renal labs when she had UTI (hospital)  Re check today s/p tx of uti and inc water intake Also feeling better ? If causing anemia      Relevant Orders   Renal function panel     Other   Anemia    Re check cbc today  Low HB in ED prev  May be due to CKD She is also taking iron -check ferritin      Relevant Orders   Ferritin   Dizziness   Hyperglycemia    A1C today  disc imp of low glycemic diet and wt loss to prevent DM2       Relevant Orders   Hemoglobin A1c   Obesity (BMI  30-39.9)    Discussed how this problem influences overall health and the risks it imposes  Reviewed plan for weight loss with lower calorie diet (via better food choices and also portion control or program like weight watchers) and exercise building up to or more than 30 minutes 5 days per week including some aerobic activity

## 2018-01-30 NOTE — Assessment & Plan Note (Addendum)
bp in fair control at this time  BP Readings from Last 1 Encounters:  01/30/18 124/72   No changes needed Most recent labs reviewed  Disc lifstyle change with low sodium diet and exercise  Doing fine after d/c of cardura and dec of amlodipine dose

## 2018-01-30 NOTE — Patient Instructions (Signed)
Labs today for kidney function and anemia   Keep drinking your water   I'm glad you are feeling better- if dizziness or headache come back please let us know  Blood pressure looks fine

## 2018-01-30 NOTE — Assessment & Plan Note (Signed)
Resolved No longer HA or dizziness  Reassuring

## 2018-01-30 NOTE — Assessment & Plan Note (Signed)
Much improved after cardiology d/c cardura and cut amlodipine  Feels back to normal Re assuring

## 2018-02-03 ENCOUNTER — Encounter: Payer: Self-pay | Admitting: *Deleted

## 2018-02-06 ENCOUNTER — Other Ambulatory Visit: Payer: Medicare Other

## 2018-02-22 DIAGNOSIS — H353131 Nonexudative age-related macular degeneration, bilateral, early dry stage: Secondary | ICD-10-CM | POA: Diagnosis not present

## 2018-02-27 ENCOUNTER — Telehealth: Payer: Self-pay

## 2018-02-27 NOTE — Telephone Encounter (Signed)
Called pt to schedule f/u on the Optimize study. Left voicemail.

## 2018-03-02 ENCOUNTER — Encounter: Payer: Self-pay | Admitting: *Deleted

## 2018-03-14 ENCOUNTER — Other Ambulatory Visit: Payer: Self-pay

## 2018-03-14 ENCOUNTER — Ambulatory Visit (INDEPENDENT_AMBULATORY_CARE_PROVIDER_SITE_OTHER): Payer: Medicare Other | Admitting: Cardiology

## 2018-03-14 ENCOUNTER — Encounter: Payer: Self-pay | Admitting: Cardiology

## 2018-03-14 ENCOUNTER — Encounter: Payer: Medicare Other | Admitting: *Deleted

## 2018-03-14 VITALS — BP 168/75 | HR 58

## 2018-03-14 DIAGNOSIS — Z9181 History of falling: Secondary | ICD-10-CM | POA: Diagnosis not present

## 2018-03-14 DIAGNOSIS — I2510 Atherosclerotic heart disease of native coronary artery without angina pectoris: Secondary | ICD-10-CM | POA: Diagnosis not present

## 2018-03-14 DIAGNOSIS — Z8673 Personal history of transient ischemic attack (TIA), and cerebral infarction without residual deficits: Secondary | ICD-10-CM

## 2018-03-14 DIAGNOSIS — Z9861 Coronary angioplasty status: Secondary | ICD-10-CM | POA: Diagnosis not present

## 2018-03-14 DIAGNOSIS — I10 Essential (primary) hypertension: Secondary | ICD-10-CM | POA: Diagnosis not present

## 2018-03-14 DIAGNOSIS — Z8679 Personal history of other diseases of the circulatory system: Secondary | ICD-10-CM

## 2018-03-14 DIAGNOSIS — N183 Chronic kidney disease, stage 3 unspecified: Secondary | ICD-10-CM

## 2018-03-14 DIAGNOSIS — Z006 Encounter for examination for normal comparison and control in clinical research program: Secondary | ICD-10-CM

## 2018-03-14 MED ORDER — CLOPIDOGREL BISULFATE 75 MG PO TABS: 75.0000 mg | ORAL_TABLET | Freq: Every day | ORAL | 0 refills | Status: AC

## 2018-03-14 MED ORDER — ASPIRIN EC 81 MG PO TBEC: 81.0000 mg | DELAYED_RELEASE_TABLET | Freq: Every day | ORAL | 3 refills | Status: DC

## 2018-03-14 MED ORDER — AMLODIPINE BESYLATE 5 MG PO TABS: 5.0000 mg | ORAL_TABLET | ORAL | 3 refills | Status: DC | PRN

## 2018-03-14 NOTE — Research (Signed)
OPTIMIZE Research study month 12 follow up completed. EKG obtained .Dr Lia Foyer In to see patient . Patient denies chest pain and states she has been compliant with medication.

## 2018-03-14 NOTE — Progress Notes (Signed)
03/14/2018 Valerie Ramirez   1933/04/05  [id number]  Primary Physician Tower, Wynelle Fanny, MD Primary Cardiologist: Dr Martinique  HPI: Valerie Ramirez is a delightful 84 year old female who was seen in the office today accompanied by her daughter for follow-up.  She has a history of non-ST elevation MI December 2018.  She underwent circumflex intervention with a drug-eluting stent.  She had moderate residual disease in the LAD.  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  At that time she apparently had some intermittent confusion and an MRI was done which showed a right brain CVA as well as evidence of old left brain CVA.  In June 2019 the patient had a fall and subdural hematoma.  Plavix was stopped but then later resumed on her CT scan showed resolution of her subdural hematoma.  Dr. Martinique saw her in September 2019.  He was concerned that her recurrent falling was secondary to orthostasis.  He backed off on her antihypertensive therapy and she is seen today for follow-up.  The patient and her daughter say that once Dr. Martinique cut back on her medications the patient has been "back to her old self".  She is driving she is active she denies any falls although she admits on one occasion she "lost her balance".  She has not had chest pain.   Current Outpatient Medications  Medication Sig Dispense Refill  . acetaminophen (TYLENOL) 650 MG CR tablet Take 650 mg by mouth every 8 (eight) hours as needed for pain.    [name] amLODipine (NORVASC) 5 MG tablet Take 1 tablet (5 mg total) by mouth as needed. 30 tablet 3  . carvedilol (COREG) 25 MG tablet Take 1 tablet (25 mg total) by mouth 2 (two) times daily with a meal. 60 tablet 11  . Cholecalciferol (VITAMIN D PO) Take by mouth.    . clopidogrel (PLAVIX) 75 MG tablet Take 1 tablet (75 mg total) by mouth daily for 23 days. 23 tablet 0  . colchicine 0.6 MG tablet Take 1 tablet (0.6 mg total) by mouth 2  (two) times daily. 60 tablet 11  . DULoxetine (CYMBALTA) 30 MG capsule Take 1 capsule (30 mg total) by mouth daily. 90 capsule 3  . ferrous sulfate 325 (65 FE) MG tablet Take 1 tablet (325 mg total) by mouth 2 (two) times daily with a meal. 60 tablet 5  . lisinopril (PRINIVIL,ZESTRIL) 5 MG tablet Take 1 tablet (5 mg total) by mouth daily. 90 tablet 3  . loperamide (IMODIUM) 2 MG capsule Take 1 capsule (2 mg total) by mouth as needed for diarrhea or loose stools. 20 capsule 0  . losartan (COZAAR) 25 MG tablet Take 25 mg by mouth daily.    . nitroGLYCERIN (NITROSTAT) 0.4 MG SL tablet Place 1 tablet (0.4 mg total) under the tongue every 5 (five) minutes x 3 doses as needed for chest pain. 25 tablet 12  . rosuvastatin (CRESTOR) 10 MG tablet Take 1 tablet (10 mg total) by mouth daily at 6 PM. 30 tablet 11  . vitamin B-12 (CYANOCOBALAMIN) 1000 MCG tablet Take 1,000 mcg by mouth daily.    Derrill Memo ON 04/06/2018] aspirin EC 81 MG tablet Take 1 tablet (81 mg total) by mouth daily. 30 tablet 3   No current facility-administered medications for this visit.     Allergies  Allergen Reactions  . Allopurinol Nausea Only  . Lipitor [Atorvastatin Calcium] Other (See Comments)  Leg aches and aching all over  . Morphine And Related Other (See Comments)    Makes the patient "loopy,"   . Simvastatin Other (See Comments)    MUSCLE PAIN  . Tape Itching and Rash    "Cannot use paper tape" --- also causes blisters. Use plastic tape    Past Medical History:  Diagnosis Date  . Arthritis    OA  . CAD (coronary artery disease)    stent December 2018  . Cancer ([place])    endometrial  . GERD (gastroesophageal reflux disease)   . Hearing loss   . History of kidney stones   . Hyperlipidemia   . Hypertension   . Hypothyroid    "not in years"  . Shortness of breath dyspnea    04/25/17- not anymore    Social History   Socioeconomic History  . Marital status: Widowed    Spouse name: Not on file  . Number  of children: 2  . Years of education: Not on file  . Highest education level: High school graduate  Occupational History  . Not on file  Social Needs  . Financial resource strain: Not on file  . Food insecurity:    Worry: Not on file    Inability: Not on file  . Transportation needs:    Medical: Not on file    Non-medical: Not on file  Tobacco Use  . Smoking status: Never Smoker  . Smokeless tobacco: Never Used  Substance and Sexual Activity  . Alcohol use: Never    Alcohol/week: 0.0 standard drinks    Frequency: Never  . Drug use: Never  . Sexual activity: Never    Birth control/protection: Post-menopausal  Lifestyle  . Physical activity:    Days per week: Not on file    Minutes per session: Not on file  . Stress: Not on file  Relationships  . Social connections:    Talks on phone: Not on file    Gets together: Not on file    Attends religious service: Not on file    Active member of club or organization: Not on file    Attends meetings of clubs or organizations: Not on file    Relationship status: Not on file  . Intimate partner violence:    Fear of current or ex partner: Not on file    Emotionally abused: Not on file    Physically abused: Not on file    Forced sexual activity: Not on file  Other Topics Concern  . Not on file  Social History Narrative  . Not on file     Family History  Problem Relation Age of Onset  . Heart disease Father   . Heart attack Father   . Leukemia Brother   . Lung cancer Brother   . Leukemia Brother   . Stomach cancer Brother   . Heart disease Sister        CABG  . Stroke Sister   . Cancer Sister   . Bladder Cancer Sister   . Heart disease Brother        CABG  . Stroke Sister        "massive brain bleed"     Review of Systems: General: negative for chills, fever, night sweats or weight changes.  Cardiovascular: negative for chest pain, dyspnea on exertion, edema, orthopnea, palpitations, paroxysmal nocturnal dyspnea or  shortness of breath Dermatological: negative for rash Respiratory: negative for cough or wheezing Urologic: negative for hematuria Abdominal: negative for  nausea, vomiting, diarrhea, bright red blood per rectum, melena, or hematemesis Neurologic: negative for visual changes, syncope, or dizziness All other systems reviewed and are otherwise negative except as noted above.    Blood pressure 138/72, pulse 69, height 5\' 2"  (1.575 m), weight 203 lb (92.1 kg).  General appearance: alert, cooperative, no distress and mildly obese Neck: no carotid bruit and no JVD Lungs: clear to auscultation bilaterally Heart: regular rate and rhythm Extremities: no edema Skin: Skin color, texture, turgor normal. 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  Essential hypertension Medications decreased secondary to orthostatic hypotension. Normal LVF with severe LVH and grade 2 DD by echo  H/O: CVA (cerebrovascular accident) Noted on MRI Jan 2019 (done for transient confusion)  H/O subdural hemorrhage Fall- SDH June 2019. Plavix held but later resumed when CT showed resolution  History of fall Recurrent falls- possibly secondary to orthostatic hypotension, improved on decreased medications.   PLAN I did not change Ms. Buddenhagen's medications.  Per Dr. Doug Sou note I told them that she could stop her Plavix on December 19 and start an aspirin 81 mg daily.  She will follow-up with Dr. Martinique in March.   Kerin Ransom PA-C 03/14/2018 10:41 AM

## 2018-03-14 NOTE — Assessment & Plan Note (Signed)
Fall- SDH June 2019. Plavix held but later resumed when CT showed resolution

## 2018-03-14 NOTE — Assessment & Plan Note (Signed)
Noted on MRI Jan 2019 (done for transient confusion)

## 2018-03-14 NOTE — Assessment & Plan Note (Signed)
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

## 2018-03-14 NOTE — Assessment & Plan Note (Signed)
Recurrent falls- possibly secondary to orthostatic hypotension, improved on decreased medications.

## 2018-03-14 NOTE — Assessment & Plan Note (Signed)
Medications decreased secondary to orthostatic hypotension. Normal LVF with severe LVH and grade 2 DD by echo

## 2018-03-14 NOTE — Patient Instructions (Signed)
Medication Instructions:  STOP Plavix on 04/06/2018  START Aspirin 81mg  on 04/06/2018 Refill for Norvasc sent to pharmacy If you need a refill on your cardiac medications before your next appointment, please call your pharmacy.   Lab work: None  If you have labs (blood work) drawn today and your tests are completely normal, you will receive your results only by: Valerie Ramirez MyChart Message (if you have MyChart) OR . A paper copy in the mail If you have any lab test that is abnormal or we need to change your treatment, we will call you to review the results.  Testing/Procedures: None   Follow-Up: At [facility], you and your health needs are our priority.  As part of our continuing mission to provide you with exceptional heart care, we have created designated Provider Care Teams.  These Care Teams include your primary Cardiologist (physician) and Advanced Practice Providers (APPs -  Physician Assistants and Nurse Practitioners) who all work together to provide you with the care you need, when you need it. . Your physician recommends that you schedule a follow-up appointment in: MARCH 2020 with DR Martinique.  Any Other Special Instructions Will Be Listed Below (If Applicable).

## 2018-04-28 ENCOUNTER — Other Ambulatory Visit: Payer: Self-pay | Admitting: *Deleted

## 2018-04-28 ENCOUNTER — Telehealth: Payer: Self-pay | Admitting: Cardiology

## 2018-04-28 MED ORDER — ROSUVASTATIN CALCIUM 10 MG PO TABS: 10.0000 mg | ORAL_TABLET | Freq: Every day | ORAL | 1 refills | Status: DC

## 2018-04-28 MED ORDER — LOSARTAN POTASSIUM 25 MG PO TABS: 25.0000 mg | ORAL_TABLET | Freq: Every day | ORAL | 6 refills | Status: DC

## 2018-04-28 NOTE — Telephone Encounter (Signed)
Rx request sent to pharmacy.  

## 2018-04-28 NOTE — Telephone Encounter (Signed)
New Message    *STAT* If patient is at the pharmacy, call can be transferred to refill team.   1. Which medications need to be refilled? (please list name of each medication and dose if known) Cozaar 25mg  and Carvedilol 25mg   2. Which pharmacy/location (including street and city if local pharmacy) is medication to be sent to? [facility] in [place]    3. Do they need a 30 day or 90 day supply? 30 day supply

## 2018-05-02 ENCOUNTER — Other Ambulatory Visit: Payer: Self-pay | Admitting: *Deleted

## 2018-05-02 MED ORDER — CARVEDILOL 25 MG PO TABS: 25.0000 mg | ORAL_TABLET | Freq: Two times a day (BID) | ORAL | 3 refills | Status: DC

## 2018-05-25 ENCOUNTER — Ambulatory Visit (INDEPENDENT_AMBULATORY_CARE_PROVIDER_SITE_OTHER): Payer: Medicare Other | Admitting: Surgery

## 2018-05-25 ENCOUNTER — Ambulatory Visit (INDEPENDENT_AMBULATORY_CARE_PROVIDER_SITE_OTHER): Payer: Self-pay

## 2018-05-25 ENCOUNTER — Encounter (INDEPENDENT_AMBULATORY_CARE_PROVIDER_SITE_OTHER): Payer: Self-pay | Admitting: Surgery

## 2018-05-25 DIAGNOSIS — M25551 Pain in right hip: Secondary | ICD-10-CM | POA: Diagnosis not present

## 2018-05-25 DIAGNOSIS — M47816 Spondylosis without myelopathy or radiculopathy, lumbar region: Secondary | ICD-10-CM

## 2018-05-25 DIAGNOSIS — M7061 Trochanteric bursitis, right hip: Secondary | ICD-10-CM | POA: Diagnosis not present

## 2018-05-25 MED ORDER — LIDOCAINE HCL 1 % IJ SOLN
3.0000 mL | INTRAMUSCULAR | Status: AC | PRN
  Administered 2018-05-25: 3 mL

## 2018-05-25 MED ORDER — BUPIVACAINE HCL 0.25 % IJ SOLN
6.0000 mL | INTRAMUSCULAR | Status: AC | PRN
  Administered 2018-05-25: 6 mL via INTRA_ARTICULAR

## 2018-05-25 MED ORDER — METHYLPREDNISOLONE ACETATE 40 MG/ML IJ SUSP
80.0000 mg | INTRAMUSCULAR | Status: AC | PRN
  Administered 2018-05-25: 80 mg

## 2018-05-25 NOTE — Progress Notes (Signed)
Office Visit Note   Patient: Valerie Ramirez           Date of Birth: [birth year]           MRN: [id number] Visit Date: 05/25/2018              Requested by: Tower, Wynelle Fanny, MD [place], [place] [address] PCP: Abner Greenspan, MD   Assessment & Plan: Visit Diagnoses:  1. Pain in right hip   2. Spondylosis without myelopathy or radiculopathy, lumbar region   3. Greater trochanteric bursitis, right     Plan: In hopes of giving patient some relief of her lateral hip pain offered injection.  After patient consent right lateral hip prepped with Betadine and greater trochanter bursa Marcaine/Depo-Medrol 6:2 injection performed.  After sitting for a few minutes patient reported excellent relief of her lateral hip pain with Marcaine in place and gait also improved.  Follow-up as needed.  In about 4 to 5 weeks if she still continues to be bothered with chronic low back pain and right buttock pain she will return for recheck and I did discuss possibly repeating lumbar MRI to see about doing lumbar ESI's.  Follow-Up Instructions: Return if symptoms worsen or fail to improve.   Orders:  Orders Placed This Encounter  Procedures  . Large Joint Inj: R greater trochanter  . XR HIP UNILAT W OR W/O PELVIS 2-3 VIEWS RIGHT   No orders of the defined types were placed in this encounter.     Procedures: Large Joint Inj: R greater trochanter on 05/25/2018 3:29 PM Indications: pain Details: 22 G 3.5 in needle, lateral approach Medications: 3 mL lidocaine 1 %; 6 mL bupivacaine 0.25 %; 80 mg methylPREDNISolone acetate 40 MG/ML Outcome: tolerated well, no immediate complications Consent was given by the patient. Patient was prepped and draped in the usual sterile fashion.       Clinical Data: No additional findings.   Subjective: Chief Complaint  Patient presents with  . Right Hip - Pain    HPI  85 year old white female comes in today with complaints of worsening right  lateral hip pain.  States that pain is been ongoing for 1 to 2 weeks.  No injury.  Localizes most of her pain to the greater trochanter bursa area.  Some right buttock pain along with back pain with known history of lumbar spondylosis.  Recent lumbar MRI performed May 19, 2012 and report showed:  IMPRESSION:  1.  No acute findings suggested. 2.  There is multilevel spondylosis with disc degeneration and facet hypertrophy as detailed above.  There is moderate left foraminal stenosis at L4-L5 and L5-S1.  Exiting nerve root encroachment at either level is possible. 3.  Moderate multifactorial spinal stenosis at L3-L4; mild central stenosis at L2-L3 and L4-L5. 4.  Central hyperintensity within the uterus is only partially imaged, although suspicious for possible endometrial thickening or fluid in the endometrial canal.  This is abnormal for age, especially if the patient has vaginal bleeding.  Pelvic ultrasound correlation suggested.  States that Dr. Lorin Mercy had briefly talked to her about surgery but due to her age he did not want to go that route.  Right lateral hip pain with ambulation and laying on her right side.  No groin pain.  Objective: Vital Signs: There were no vitals taken for this visit.  Physical Exam HENT:     Head: Normocephalic and atraumatic.     Mouth/Throat:  Mouth: Mucous membranes are dry.  Eyes:     Extraocular Movements: Extraocular movements intact.     Pupils: Pupils are equal, round, and reactive to light.  Pulmonary:     Effort: No respiratory distress.  Musculoskeletal:     Comments: Patient has a Trendelenburg gait.  Marked tenderness over the right hip greater trochanter bursa and tenderness also extends midway down the IT band.  Negative logroll about bilateral hips.  Negative straight leg raise.  Mild to moderate right sciatic notch tenderness.  Neurovascular intact.  No focal motor deficits.  Skin:    General: Skin is warm and dry.    Neurological:     General: No focal deficit present.     Mental Status: She is alert and oriented to person, place, and time.  Psychiatric:        Mood and Affect: Mood normal.        Behavior: Behavior normal.     Ortho Exam  Specialty Comments:  No specialty comments available.  Imaging: No results found.   PMFS History: Patient Active Problem List   Diagnosis Date Noted  . Diarrhea 12/26/2017  . Dizziness 12/26/2017  . Depression 10/26/2017  . H/O subdural hemorrhage 09/18/2017  . CAD S/P percutaneous coronary angioplasty 09/18/2017  . Closed fracture of nasal bones   . Epistaxis   . Elevated transaminase level 08/03/2017  . Radial artery injury, left, sequela 06/15/2017  . H/O: CVA (cerebrovascular accident) 05/26/2017  . Sick sinus syndrome ([place]) 05/25/2017  . Unstable angina ([place])   . Exertional chest pain 04/05/2017  . Chronic renal disease, stage III ([place]) 04/05/2017  . Anemia 04/05/2017  . Thoracic back pain 09/15/2016  . History of radius fracture 06/01/2016  . AKI (acute kidney injury) ([place]) 04/25/2016  . Status post cervical spinal fusion 08/18/2015  . Osteoarthritis, hip, bilateral 08/12/2014  . Hearing loss in right ear 06/12/2014  . Colon cancer screening 08/10/2013  . Seborrheic keratoses, inflamed 08/10/2013  . History of fall 04/10/2013  . Encounter for Medicare annual wellness exam 08/08/2012  . Gout 07/14/2012  . History of endometrial cancer 05/25/2012  . Degenerative joint disease of cervical spine 05/03/2011  . Other screening mammogram 03/31/2011  . Hyperglycemia 09/26/2007  . Obesity (BMI 30-39.9) 07/12/2007  . Essential hypertension 07/11/2007  . History of cardiomyopathy 07/11/2007  . Osteoarthritis 07/11/2007  . 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 Hypothyroidism 10/05/2006  . HYPERCHOLESTEROLEMIA, PURE 10/05/2006   Past Medical History:  Diagnosis Date  . Arthritis    OA  . CAD (coronary artery disease)     stent December 2018  . Cancer ([place])    endometrial  . GERD (gastroesophageal reflux disease)   . Hearing loss   . History of kidney stones   . Hyperlipidemia   . Hypertension   . Hypothyroid    "not in years"  . Shortness of breath dyspnea    04/25/17- not anymore    Family History  Problem Relation Age of Onset  . Heart disease Father   . Heart attack Father   . Leukemia Brother   . Lung cancer Brother   . Leukemia Brother   . Stomach cancer Brother   . Heart disease Sister        CABG  . Stroke Sister   . Cancer Sister   . Bladder Cancer Sister   . Heart disease Brother        CABG  . Stroke Sister        "  massive brain bleed"    Past Surgical History:  Procedure Laterality Date  . ABDOMINAL HYSTERECTOMY    . ANTERIOR CERVICAL DECOMP/DISCECTOMY FUSION [name] 08/18/2015   Procedure: C5-6, C6-7 Anterior Cervical Discectomy and Fusion, Allograft, Plate;  Surgeon: Marybelle Killings, MD;  Location: [facility];  Service: Orthopedics;  Laterality: [name];  . bone spur removed Right    shoulder  . BOWEL RESECTION  07/04/2012   Procedure: SMALL BOWEL RESECTION;  Surgeon: Alvino Chapel, MD;  Location: [facility];  Service: Gynecology;;  . BREAST BIOPSY    . CHOLECYSTECTOMY    . CORONARY STENT INTERVENTION [name] 04/06/2017   Procedure: CORONARY STENT INTERVENTION;  Surgeon: Wellington Hampshire, MD;  Location: [facility];  Service: Cardiovascular;  Laterality: [name];  . DILATION AND CURETTAGE OF UTERUS  1999  . EYE SURGERY Bilateral    cataracts  . FALSE ANEURYSM REPAIR Right 04/26/2017   Procedure: 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;  Surgeon: Conrad Menard, MD;  Location: [facility];  Service: Vascular;  Laterality: Right;  . HAND SURGERY  12/2008   after hand fx/due to fall  . JOINT REPLACEMENT Bilateral 07/1998   knees  . KNEE ARTHROPLASTY  05/23/1998   total right  . LAPAROTOMY Bilateral 07/04/2012   Procedure: EXPLORATORY LAPAROTOMY TOTAL ABDOMINAL HYSTERECTOMY BILATERAL  SALPINGO-OOPHORECTOMY with small bowel resection ;  Surgeon: Alvino Chapel, MD;  Location: [facility];  Service: Gynecology;  Laterality: Bilateral;  . LEFT HEART CATH AND CORONARY ANGIOGRAPHY [name] 04/06/2017   Procedure: LEFT HEART CATH AND CORONARY ANGIOGRAPHY;  Surgeon: Jolaine Artist, MD;  Location: [facility];  Service: Cardiovascular;  Laterality: [name];  . polyp removal  1999  . TUBAL LIGATION     Social History   Occupational History  . Not on file  Tobacco Use  . Smoking status: Never Smoker  . Smokeless tobacco: Never Used  Substance and Sexual Activity  . Alcohol use: Never    Alcohol/week: 0.0 standard drinks    Frequency: Never  . Drug use: Never  . Sexual activity: Never    Birth control/protection: Post-menopausal

## 2018-06-11 ENCOUNTER — Other Ambulatory Visit: Payer: Self-pay | Admitting: Cardiology

## 2018-07-06 ENCOUNTER — Telehealth: Payer: Self-pay

## 2018-07-06 NOTE — Telephone Encounter (Signed)
COVID-19 Pre-Screening Questions:  Provider: [place]   Needs f/u  >6 WEEKS S/W DAUGHTER SHE SEES PT DAILY ([facility]) AND BRINGS TO APPTS  . Have you been in contact with someone that was recently sick with fever/cough or confirmed to have the [place] virus?  NO  *Contact with a confirmed case should stay at home, away from confirmed patient, monitor symptoms, and reach out to PCP for e-visit/additional testing.  2. Do you have any of the following symptoms [cough, fever (100.4 or greater)], and/or shortness of breath)?  NO  *ALL PTS W/ FEVER SHOULD BE REFERRED TO PCP FOR E-VISIT* _________________________________________________  Cardiac Questionnaire:    Since your last visit or hospitalization:    1. Have you been having chest pain? NO   2. Have you been having shortness of breath? NO   3. Have you been having increasing edema, wt gain, or increase in abdominal girth (pants fitting more tightly)? NO   4. Have you had any passing out spells? NO    *A YES to any of these questions would result in the appointment being kept.  *If all the answers to these questions are NO, we should indicate that given the current situation regarding the worldwide coronarvirus pandemic, at the recommendation of the CDC, we are looking to limit gatherings in our waiting area, and thus will reschedule their appointment beyond four weeks from today.

## 2018-07-10 ENCOUNTER — Ambulatory Visit: Payer: Medicare Other | Admitting: Cardiology

## 2018-08-04 ENCOUNTER — Ambulatory Visit: Payer: Medicare Other

## 2018-08-07 ENCOUNTER — Encounter: Payer: Medicare Other | Admitting: Family Medicine

## 2018-08-19 ENCOUNTER — Other Ambulatory Visit: Payer: Self-pay | Admitting: Family Medicine

## 2018-10-08 ENCOUNTER — Other Ambulatory Visit: Payer: Self-pay | Admitting: Cardiology

## 2018-10-17 ENCOUNTER — Encounter

## 2018-10-18 ENCOUNTER — Other Ambulatory Visit: Payer: Self-pay | Admitting: Cardiology

## 2018-10-30 ENCOUNTER — Ambulatory Visit: Payer: Medicare Other

## 2018-11-04 ENCOUNTER — Other Ambulatory Visit: Payer: Self-pay | Admitting: Cardiology

## 2018-11-04 ENCOUNTER — Other Ambulatory Visit: Payer: Self-pay | Admitting: Family Medicine

## 2018-11-06 NOTE — Telephone Encounter (Signed)
CPE scheduled 11/08/18, last filled on 10/26/17 #90 caps with 3 refills, please advise

## 2018-11-08 ENCOUNTER — Encounter: Payer: Medicare Other | Admitting: Family Medicine

## 2018-11-17 IMAGING — CT CT HEAD W/O CM
4 series · 15 of 47 positions shown, 17 images · non-contrast
Comparison: Head CT dated 10/13/2017

CLINICAL DATA: 85-year-old female with altered mental status.
Recent subdural hemorrhage.

EXAM:
CT HEAD WITHOUT CONTRAST
TECHNIQUE: Contiguous axial images were obtained from the base of the skull
through the vertex without intravenous contrast.

[Series 3: head without · axial · non-contrast · 0.43mm/px · z∈[-148,-28]mm · 7 of 33 slices shown, 9 images]
[im 5/33  brain]
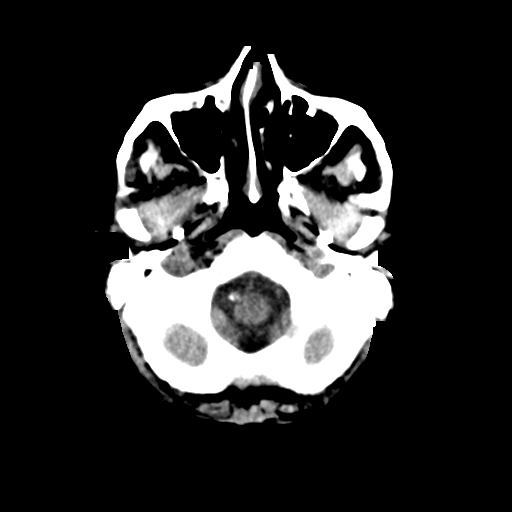
[im 5/33  bone]
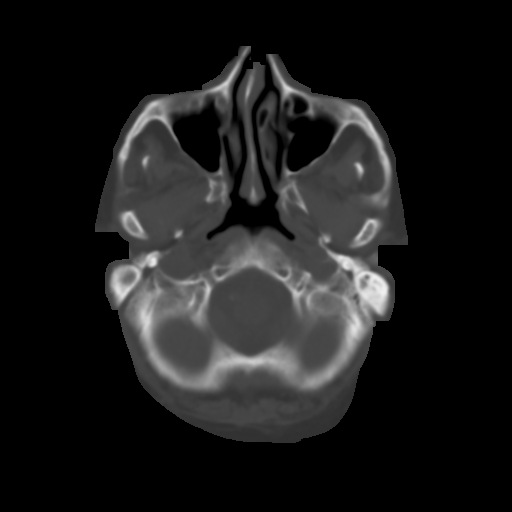
[im 9/33  brain]
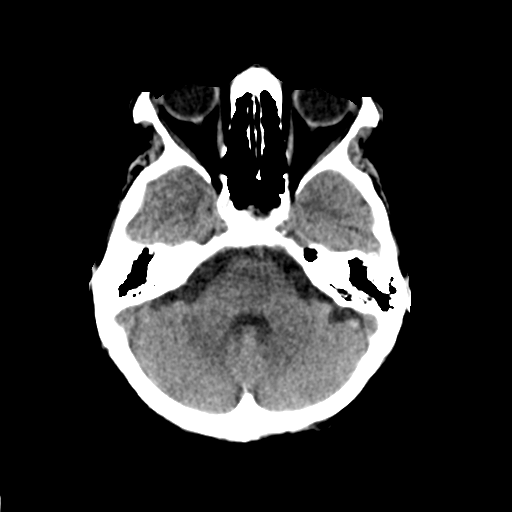
[im 13/33  brain]
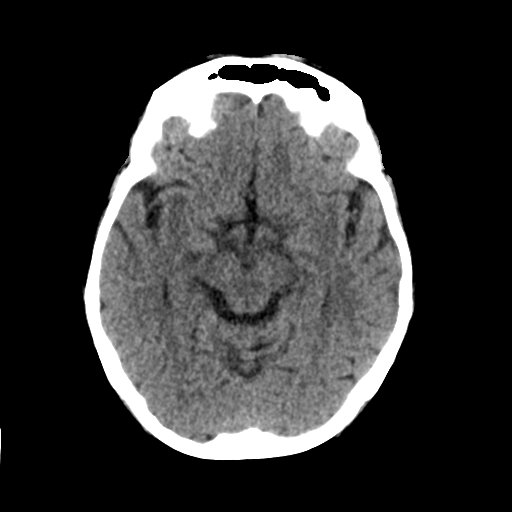
[im 17/33  brain]
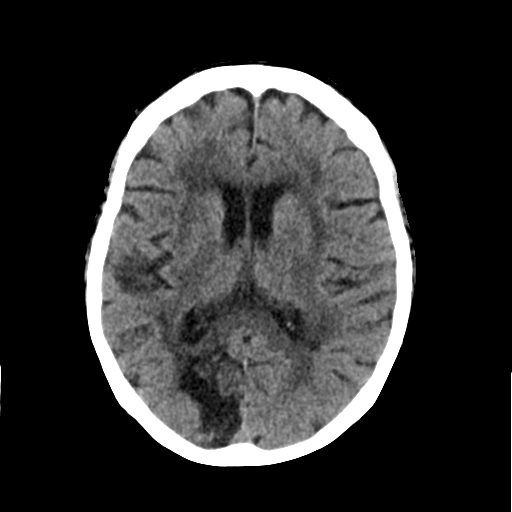
[im 21/33  brain]
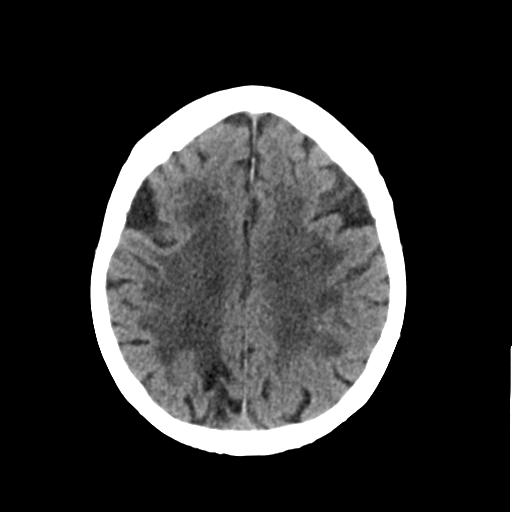
[im 21/33  bone]
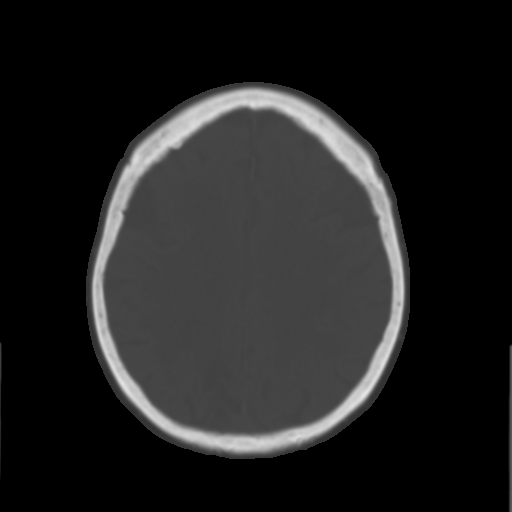
[im 25/33  brain]
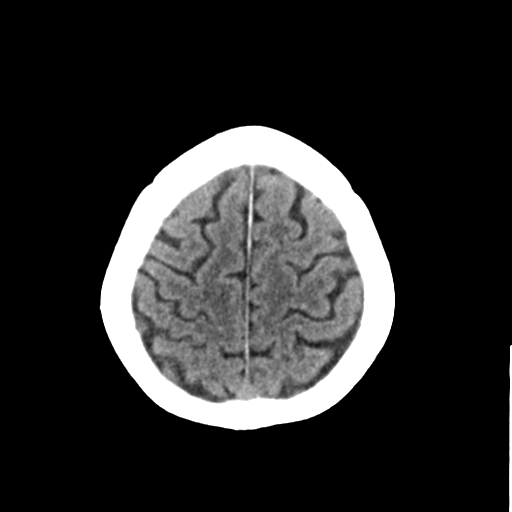
[im 29/33  brain]
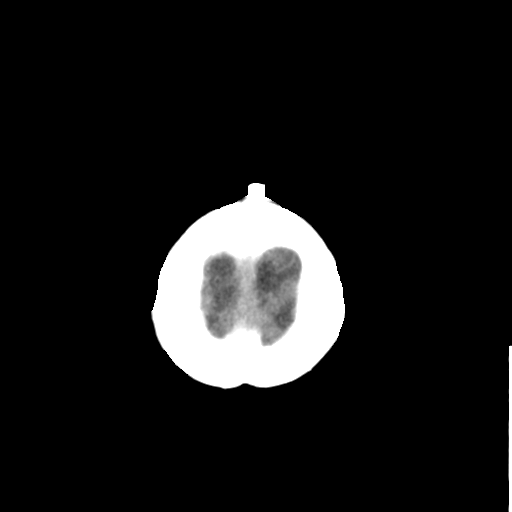

[Series 4: head bone · axial · 0.43mm/px · z∈[-152,-136]mm · 2 of 82 slices shown]
[im 9/82  bone]
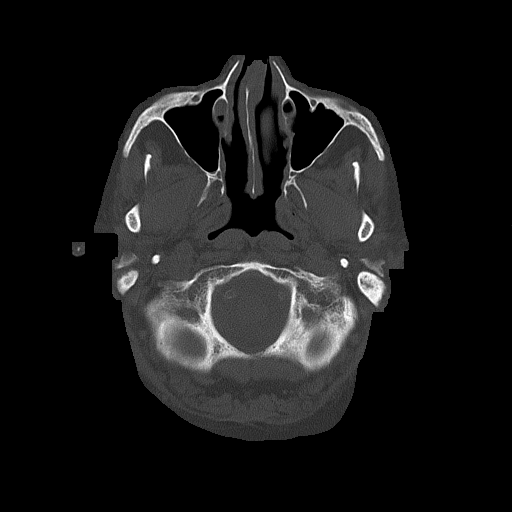
[im 17/82  bone]
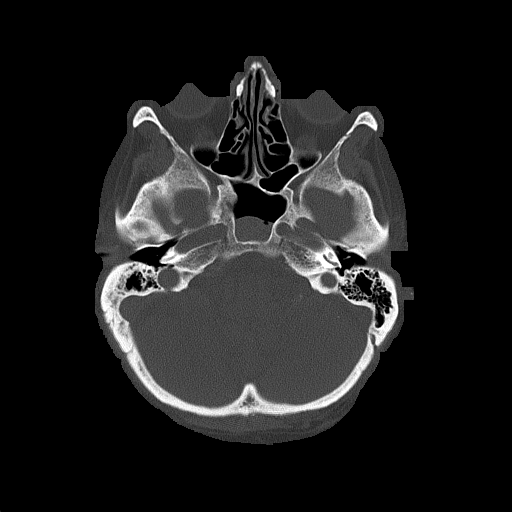

[Series 5: head without cor · coronal · non-contrast · 0.32mm/px · 3 of 67 slices shown]
[im 23/67  brain]
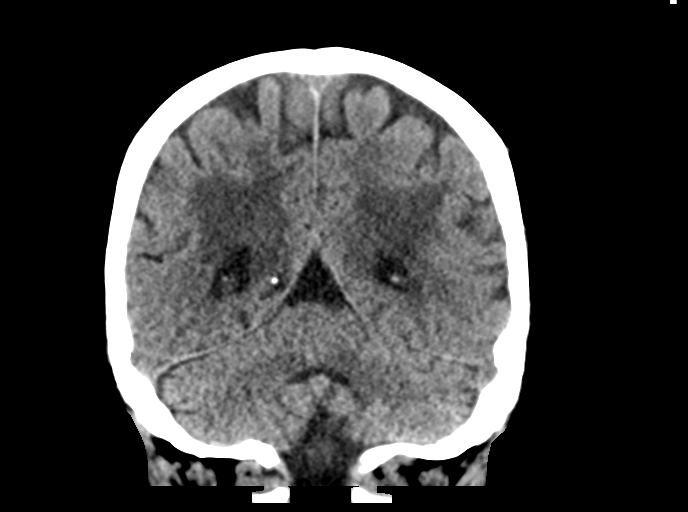
[im 30/67  brain]
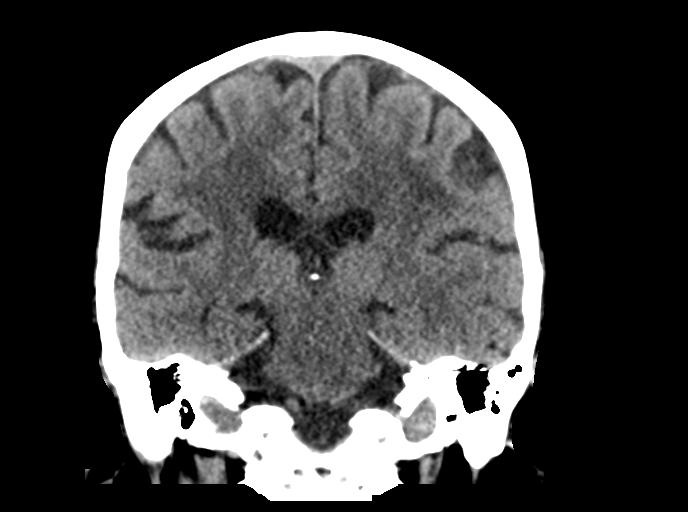
[im 37/67  brain]
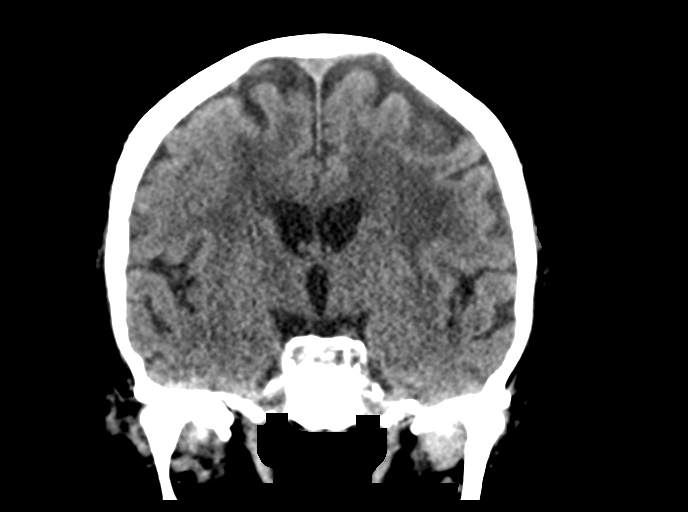

[Series 6: head without sag · sagittal · non-contrast · 0.32mm/px · 3 of 65 slices shown]
[im 22/65  brain]
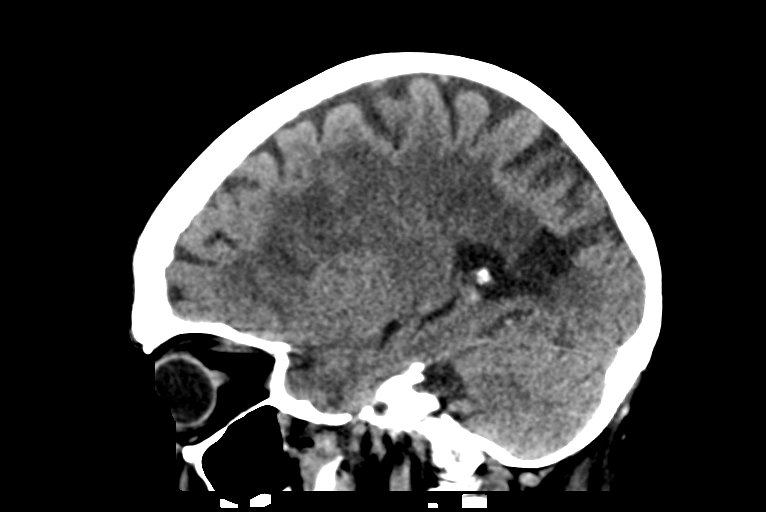
[im 33/65  brain]
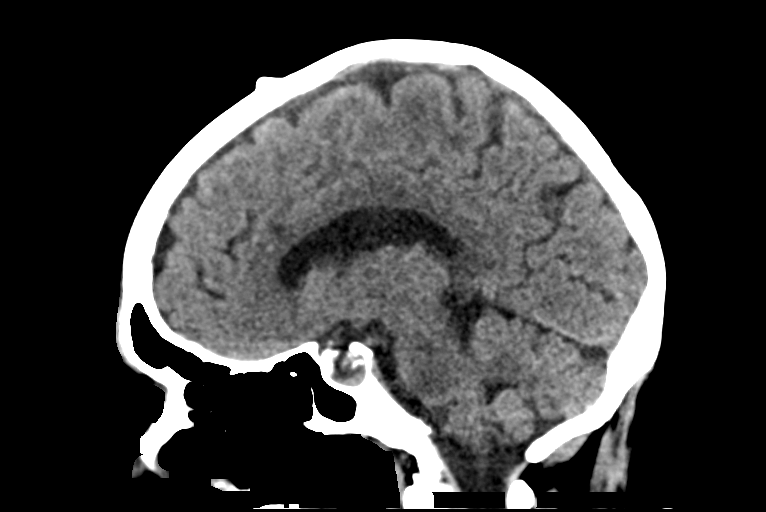
[im 43/65  brain]
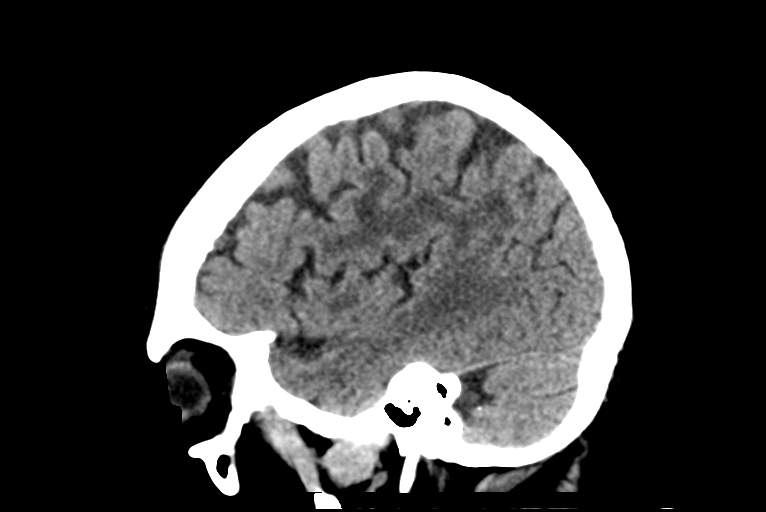

[15 of 47 positions shown; findings below may reference images not displayed]

FINDINGS: Brain: There is mild age-related atrophy and moderate chronic
microvascular ischemic changes. An area of old infarct and
encephalomalacia noted in the right occipital lobe. There is no
acute intracranial hemorrhage. No mass effect or midline shift. No
extra-axial fluid collection. Interval resolution of the previously
seen parafalcine subdural hemorrhage.

Vascular: No hyperdense vessel or unexpected calcification.

Skull: Normal. Negative for fracture or focal lesion.

Sinuses/Orbits: There is partial opacification of the right sphenoid
sinus. The remainder of the visualized paranasal sinuses and mastoid
air cells are clear.

Other: None
IMPRESSION: 1. No acute intracranial hemorrhage. Resolution of the previously
seen parafalcine subdural blood.
2. Age-related atrophy and chronic microvascular ischemic changes.
Old right occipital/PCA territory infarct.

## 2018-12-05 ENCOUNTER — Other Ambulatory Visit: Payer: Self-pay | Admitting: Family Medicine

## 2018-12-10 ENCOUNTER — Telehealth: Payer: Self-pay | Admitting: Family Medicine

## 2018-12-10 DIAGNOSIS — I10 Essential (primary) hypertension: Secondary | ICD-10-CM

## 2018-12-10 DIAGNOSIS — R7303 Prediabetes: Secondary | ICD-10-CM

## 2018-12-10 DIAGNOSIS — R7401 Elevation of levels of liver transaminase levels: Secondary | ICD-10-CM

## 2018-12-10 DIAGNOSIS — N189 Chronic kidney disease, unspecified: Secondary | ICD-10-CM

## 2018-12-10 DIAGNOSIS — E7800 Pure hypercholesterolemia, unspecified: Secondary | ICD-10-CM

## 2018-12-10 DIAGNOSIS — D631 Anemia in chronic kidney disease: Secondary | ICD-10-CM

## 2018-12-10 DIAGNOSIS — E039 Hypothyroidism, unspecified: Secondary | ICD-10-CM

## 2018-12-10 NOTE — Telephone Encounter (Signed)
-----   Message from Ellamae Sia sent at 12/04/2018  1:55 PM EDT ----- Regarding: Lab orders for Monday, 8.24.20 Patient is scheduled for CPX labs, please order future labs, Thanks , Karna Christmas

## 2018-12-11 ENCOUNTER — Other Ambulatory Visit (INDEPENDENT_AMBULATORY_CARE_PROVIDER_SITE_OTHER): Payer: Medicare Other

## 2018-12-11 DIAGNOSIS — I10 Essential (primary) hypertension: Secondary | ICD-10-CM

## 2018-12-11 DIAGNOSIS — D631 Anemia in chronic kidney disease: Secondary | ICD-10-CM

## 2018-12-11 DIAGNOSIS — R740 Nonspecific elevation of levels of transaminase and lactic acid dehydrogenase [LDH]: Secondary | ICD-10-CM

## 2018-12-11 DIAGNOSIS — N189 Chronic kidney disease, unspecified: Secondary | ICD-10-CM

## 2018-12-11 DIAGNOSIS — R7401 Elevation of levels of liver transaminase levels: Secondary | ICD-10-CM

## 2018-12-11 DIAGNOSIS — E7800 Pure hypercholesterolemia, unspecified: Secondary | ICD-10-CM

## 2018-12-11 DIAGNOSIS — R7303 Prediabetes: Secondary | ICD-10-CM

## 2018-12-11 DIAGNOSIS — E039 Hypothyroidism, unspecified: Secondary | ICD-10-CM

## 2018-12-11 DIAGNOSIS — R197 Diarrhea, unspecified: Secondary | ICD-10-CM

## 2018-12-11 LAB — CBC WITH DIFFERENTIAL/PLATELET
Basophils Absolute: 0 10*3/uL (ref 0.0–0.1)
Basophils Relative: 0.3 % (ref 0.0–3.0)
Eosinophils Absolute: 0.1 10*3/uL (ref 0.0–0.7)
Eosinophils Relative: 1.1 % (ref 0.0–5.0)
HCT: 35.3 % — ABNORMAL LOW (ref 36.0–46.0)
Hemoglobin: 11.7 g/dL — ABNORMAL LOW (ref 12.0–15.0)
Lymphocytes Relative: 23.5 % (ref 12.0–46.0)
Lymphs Abs: 1.5 10*3/uL (ref 0.7–4.0)
MCHC: 33.3 g/dL (ref 30.0–36.0)
MCV: 91.9 fl (ref 78.0–100.0)
Monocytes Absolute: 0.8 10*3/uL (ref 0.1–1.0)
Monocytes Relative: 13.1 % — ABNORMAL HIGH (ref 3.0–12.0)
Neutro Abs: 3.9 10*3/uL (ref 1.4–7.7)
Neutrophils Relative %: 62 % (ref 43.0–77.0)
Platelets: 169 10*3/uL (ref 150.0–400.0)
RBC: 3.83 Mil/uL — ABNORMAL LOW (ref 3.87–5.11)
RDW: 17.6 % — ABNORMAL HIGH (ref 11.5–15.5)
WBC: 6.4 10*3/uL (ref 4.0–10.5)

## 2018-12-11 LAB — COMPREHENSIVE METABOLIC PANEL
ALT: 43 U/L — ABNORMAL HIGH (ref 0–35)
AST: 51 U/L — ABNORMAL HIGH (ref 0–37)
Albumin: 4 g/dL (ref 3.5–5.2)
Alkaline Phosphatase: 133 U/L — ABNORMAL HIGH (ref 39–117)
BUN: 26 mg/dL — ABNORMAL HIGH (ref 6–23)
CO2: 25 mEq/L (ref 19–32)
Calcium: 9.3 mg/dL (ref 8.4–10.5)
Chloride: 104 mEq/L (ref 96–112)
Creatinine, Ser: 1.31 mg/dL — ABNORMAL HIGH (ref 0.40–1.20)
GFR: 38.65 mL/min — ABNORMAL LOW (ref >60.00)
Glucose, Bld: 140 mg/dL — ABNORMAL HIGH (ref 70–99)
Potassium: 4.5 mEq/L (ref 3.5–5.1)
Sodium: 138 mEq/L (ref 135–145)
Total Bilirubin: 0.8 mg/dL (ref 0.2–1.2)
Total Protein: 6.6 g/dL (ref 6.0–8.3)

## 2018-12-11 LAB — LIPID PANEL
Cholesterol: 132 mg/dL (ref 0–200)
HDL: 48.4 mg/dL (ref >39.00)
LDL Cholesterol: 56 mg/dL (ref 0–99)
NonHDL: 83.13
Total CHOL/HDL Ratio: 3
Triglycerides: 136 mg/dL (ref 0.0–149.0)
VLDL: 27.2 mg/dL (ref 0.0–40.0)

## 2018-12-11 LAB — HEMOGLOBIN A1C: Hgb A1c MFr Bld: 6.1 % (ref 4.6–6.5)

## 2018-12-12 LAB — TSH: TSH: 2.31 u[IU]/mL (ref 0.35–4.50)

## 2018-12-12 LAB — FERRITIN: Ferritin: 25.7 ng/mL (ref 10.0–291.0)

## 2018-12-18 ENCOUNTER — Encounter: Payer: Self-pay | Admitting: Family Medicine

## 2018-12-18 ENCOUNTER — Ambulatory Visit (INDEPENDENT_AMBULATORY_CARE_PROVIDER_SITE_OTHER): Payer: Medicare Other | Admitting: Family Medicine

## 2018-12-18 ENCOUNTER — Other Ambulatory Visit: Payer: Self-pay

## 2018-12-18 VITALS — BP 110/70 | HR 76 | Temp 97.9°F | Ht 62.0 in | Wt 202.3 lb

## 2018-12-18 DIAGNOSIS — R740 Nonspecific elevation of levels of transaminase and lactic acid dehydrogenase [LDH]: Secondary | ICD-10-CM

## 2018-12-18 DIAGNOSIS — Z0000 Encounter for general adult medical examination without abnormal findings: Secondary | ICD-10-CM

## 2018-12-18 DIAGNOSIS — I495 Sick sinus syndrome: Secondary | ICD-10-CM

## 2018-12-18 DIAGNOSIS — D631 Anemia in chronic kidney disease: Secondary | ICD-10-CM

## 2018-12-18 DIAGNOSIS — I10 Essential (primary) hypertension: Secondary | ICD-10-CM

## 2018-12-18 DIAGNOSIS — H9191 Unspecified hearing loss, right ear: Secondary | ICD-10-CM

## 2018-12-18 DIAGNOSIS — E039 Hypothyroidism, unspecified: Secondary | ICD-10-CM

## 2018-12-18 DIAGNOSIS — E7800 Pure hypercholesterolemia, unspecified: Secondary | ICD-10-CM

## 2018-12-18 DIAGNOSIS — N183 Chronic kidney disease, stage 3 unspecified: Secondary | ICD-10-CM

## 2018-12-18 DIAGNOSIS — R7303 Prediabetes: Secondary | ICD-10-CM | POA: Diagnosis not present

## 2018-12-18 DIAGNOSIS — T81718A Complication of other artery following a procedure, not elsewhere classified, initial encounter: Secondary | ICD-10-CM | POA: Diagnosis not present

## 2018-12-18 DIAGNOSIS — E669 Obesity, unspecified: Secondary | ICD-10-CM | POA: Diagnosis not present

## 2018-12-18 DIAGNOSIS — Z1211 Encounter for screening for malignant neoplasm of colon: Secondary | ICD-10-CM | POA: Diagnosis not present

## 2018-12-18 DIAGNOSIS — R7401 Elevation of levels of liver transaminase levels: Secondary | ICD-10-CM

## 2018-12-18 DIAGNOSIS — I729 Aneurysm of unspecified site: Secondary | ICD-10-CM

## 2018-12-18 MED ORDER — COLCHICINE 0.6 MG PO TABS: 0.6000 mg | ORAL_TABLET | Freq: Two times a day (BID) | ORAL | 11 refills | Status: DC

## 2018-12-18 MED ORDER — DULOXETINE HCL 30 MG PO CPEP: 30.0000 mg | ORAL_CAPSULE | Freq: Every day | ORAL | 3 refills | Status: DC

## 2018-12-18 MED ORDER — FERROUS SULFATE 325 (65 FE) MG PO TABS: 325.0000 mg | ORAL_TABLET | Freq: Every day | ORAL | 11 refills | Status: DC

## 2018-12-18 NOTE — Assessment & Plan Note (Signed)
bp in fair control at this time  BP Readings from Last 1 Encounters:  12/18/18 110/70   No changes needed Most recent labs reviewed  Disc lifstyle change with low sodium diet and exercise

## 2018-12-18 NOTE — Assessment & Plan Note (Signed)
Doing well with hearing aide

## 2018-12-18 NOTE — Assessment & Plan Note (Signed)
In obese pt Lab Results  Component Value Date   HGBA1C 6.1 12/11/2018   disc imp of low glycemic diet and wt loss to prevent DM2

## 2018-12-18 NOTE — Patient Instructions (Addendum)
Don't forget to get an eye exam   Keep drinking water  64 oz per day- optimally  This helps kidney health   Don't take tylenol unless you really need it- due to liver function tests   For weight loss and to avoid diabetes  Try to get most of your carbohydrates from produce (with the exception of white potatoes)  Eat less bread/pasta/rice/snack foods/cereals/sweets and other items from the middle of the grocery store (processed carbs)   Exercise as tolerated   Blood count has improved- you can cut iron from twice daily to once daily

## 2018-12-18 NOTE — Assessment & Plan Note (Signed)
Improved Hb and ferritin -closer to normal  Taking bid iron  Will cut dose to daily

## 2018-12-18 NOTE — Assessment & Plan Note (Signed)
Reviewed health habits including diet and exercise and skin cancer prevention Reviewed appropriate screening tests for age  Also reviewed health mt list, fam hx and immunization status , as well as social and family history   See HPI Labs reviewed  Declines colon or breast screening due to age  Declines shingrix  Declines dexa  Enc more exercise  Has advance directive No cognitive concerns  Continues to use hearing aides , overdue for eye exam (macular degeneration)  Enc lower carb diet and good fluid intake along with exercise as tolerated

## 2018-12-18 NOTE — Assessment & Plan Note (Signed)
Disc goals for lipids and reasons to control them Rev last labs with pt Rev low sat fat diet in detail  Continue crestor and diet  LDL under 70

## 2018-12-18 NOTE — Assessment & Plan Note (Signed)
Improved Cr of 1.31 Enc water intake- getting better at that  Urged to avoid renal toxic medications  Continue low dose lisinopril

## 2018-12-18 NOTE — Assessment & Plan Note (Signed)
Followed by cardiology 

## 2018-12-18 NOTE — Progress Notes (Signed)
Subjective:    Patient ID: Valerie Ramirez, female    DOB: 07/02/1932, 85 y.o.   MRN: [id number]  HPI Here for amw and review of chronic health problems   I have personally reviewed the Medicare Annual Wellness questionnaire and have noted 1. The patient's medical and social history 2. Their use of alcohol, tobacco or illicit drugs 3. Their current medications and supplements 4. The patient's functional ability including ADL's, fall risks, home safety risks and hearing or visual             impairment. 5. Diet and physical activities 6. Evidence for depression or mood disorders  The patients weight, height, BMI have been recorded in the chart and visual acuity is per eye clinic.  I have made referrals, counseling and provided education to the patient based review of the above and I have provided the pt with a written personalized care plan for preventive services. Reviewed and updated provider list, see scanned forms.  See scanned forms.  Routine anticipatory guidance given to patient.  See health maintenance. Colon cancer screening out aged Breast cancer screening 6/15 -not interested in mammograms  Self breast exam-no lumps or changes  Flu vaccine-pt plans to get at [facility] Tetanus vaccine 7/13 Td Pneumovax up to date Zoster vaccine- never had chicken pox/does not want the vaccine  dexa - nl 2015  Falls- one fall /no injuries   (remote hx of subdural years ago)  Fractures -none    ( did break her nose once years ago) Supplements-does take ca and D Exercise-mows the yard/ goes fishing/not interested in more than that  Advance directive- has one (kids are poa)  Cognitive function addressed- see scanned forms- and if abnormal then additional documentation follows.  No worries about memory or cognition  No confusion and has not become lost    PMH and SH reviewed  Meds, vitals, and allergies reviewed.   ROS: See HPI.  Otherwise negative.    Wt Readings from Last 3  Encounters:  12/18/18 202 lb 5 oz (91.8 kg)  03/14/18 203 lb (92.1 kg)  01/30/18 196 lb 4 oz (89 kg)  stable  Mows for exercise Eats fairly healthy  37.00 kg/m    Hearing Screening   '125Hz'  '250Hz'  '500Hz'  '1000Hz'  '2000Hz'  '3000Hz'  '4000Hz'  '6000Hz'  '8000Hz'   Right ear:           Left ear:           Comments: Pt wears hearing aids    Visual Acuity Screening   Right eye Left eye Both eyes  Without correction:     With correction: '20/40 20/30 20/30 '  pt has non exudative age related macular degeneration  Last eye exam 1-2 y ago   Hearing aide- wears it in the R ear and it it helpful    bp is stable today  No cp or palpitations or headaches or edema  No side effects to medicines  BP Readings from Last 3 Encounters:  12/18/18 110/70  03/14/18 (!) 168/75  03/14/18 138/72    Very good control  Her CAD/ heart problems have been "fine" -nothing new    CKD Lab Results  Component Value Date   CREATININE 1.31 (H) 12/11/2018   BUN 26 (H) 12/11/2018   NA 138 12/11/2018   K 4.5 12/11/2018   CL 104 12/11/2018   CO2 25 12/11/2018  she works to drink water  Is in the habit of it     Lab Results  Component Value Date   ALT 43 (H) 12/11/2018   AST 51 (H) 12/11/2018   ALKPHOS 133 (H) 12/11/2018   BILITOT 0.8 12/11/2018   h/o elevated transaminases Last time 41 and 48 ak phos stable  Nl bili  Takes crestor 10 mg  No abd pain  No etoh  occ tylenol -prn    H/o anemia  Lab Results  Component Value Date   WBC 6.4 12/11/2018   HGB 11.7 (L) 12/11/2018   HCT 35.3 (L) 12/11/2018   MCV 91.9 12/11/2018   PLT 169.0 12/11/2018   Lab Results  Component Value Date   FERRITIN 25.7 12/11/2018   She takes iron -with improvement  Improved hb from 9.6    Hypothyroidism  Pt has no clinical changes No change in energy level/ hair or skin/ edema and no tremor Lab Results  Component Value Date   TSH 2.31 12/11/2018    Hyperlipidemia Lab Results  Component Value Date   CHOL 132  12/11/2018   CHOL 121 07/27/2017   CHOL 123 05/18/2017   Lab Results  Component Value Date   HDL 48.40 12/11/2018   HDL 49.70 07/27/2017   HDL 52 05/18/2017   Lab Results  Component Value Date   LDLCALC 56 12/11/2018   LDLCALC 41 07/27/2017   LDLCALC 28 05/18/2017   Lab Results  Component Value Date   TRIG 136.0 12/11/2018   TRIG 153.0 (H) 07/27/2017   TRIG 214 (H) 05/18/2017   Lab Results  Component Value Date   CHOLHDL 3 12/11/2018   CHOLHDL 2 07/27/2017   CHOLHDL 2.4 05/18/2017   Lab Results  Component Value Date   LDLDIRECT 136.0 10/31/2015   LDLDIRECT 115.6 07/28/2012   LDLDIRECT 139.5 10/05/2010   crestor and diet   Prediabetes Lab Results  Component Value Date   HGBA1C 6.1 12/11/2018  up from 5.0 last oct No dm in family  Is obese   Patient Active Problem List   Diagnosis Date Noted  . Diarrhea 12/26/2017  . Dizziness 12/26/2017  . Depression 10/26/2017  . H/O subdural hemorrhage 09/18/2017  . CAD S/P percutaneous coronary angioplasty 09/18/2017  . Closed fracture of nasal bones   . Epistaxis   . Elevated transaminase level 08/03/2017  . Radial artery injury, left, sequela 06/15/2017  . H/O: CVA (cerebrovascular accident) 05/26/2017  . Sick sinus syndrome ([place]) 05/25/2017  . Unstable angina ([place])   . Exertional chest pain 04/05/2017  . Chronic renal disease, stage III ([place]) 04/05/2017  . Anemia 04/05/2017  . Thoracic back pain 09/15/2016  . History of radius fracture 06/01/2016  . Status post cervical spinal fusion 08/18/2015  . Osteoarthritis, hip, bilateral 08/12/2014  . Hearing loss in right ear 06/12/2014  . Colon cancer screening 08/10/2013  . Seborrheic keratoses, inflamed 08/10/2013  . History of fall 04/10/2013  . Encounter for Medicare annual wellness exam 08/08/2012  . Gout 07/14/2012  . History of endometrial cancer 05/25/2012  . Degenerative joint disease of cervical spine 05/03/2011  . Other screening mammogram 03/31/2011   . Prediabetes 09/26/2007  . Obesity (BMI 30-39.9) 07/12/2007  . Essential hypertension 07/11/2007  . History of cardiomyopathy 07/11/2007  . Osteoarthritis 07/11/2007  . 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 Hypothyroidism 10/05/2006  . HYPERCHOLESTEROLEMIA, PURE 10/05/2006   Past Medical History:  Diagnosis Date  . Arthritis    OA  . CAD (coronary artery disease)    stent December 2018  . Cancer [facility])    endometrial  .  GERD (gastroesophageal reflux disease)   . Hearing loss   . History of kidney stones   . Hyperlipidemia   . Hypertension   . Hypothyroid    "not in years"  . Shortness of breath dyspnea    04/25/17- not anymore   Past Surgical History:  Procedure Laterality Date  . ABDOMINAL HYSTERECTOMY    . ANTERIOR CERVICAL DECOMP/DISCECTOMY FUSION [name] 08/18/2015   Procedure: C5-6, C6-7 Anterior Cervical Discectomy and Fusion, Allograft, Plate;  Surgeon: Marybelle Killings, MD;  Location: [place];  Service: Orthopedics;  Laterality: [name];  . bone spur removed Right    shoulder  . BOWEL RESECTION  07/04/2012   Procedure: SMALL BOWEL RESECTION;  Surgeon: Alvino Chapel, MD;  Location: [facility];  Service: Gynecology;;  . BREAST BIOPSY    . CHOLECYSTECTOMY    . CORONARY STENT INTERVENTION [name] 04/06/2017   Procedure: CORONARY STENT INTERVENTION;  Surgeon: Wellington Hampshire, MD;  Location: [place] [facility];  Service: Cardiovascular;  Laterality: [name];  . DILATION AND CURETTAGE OF UTERUS  1999  . EYE SURGERY Bilateral    cataracts  . FALSE ANEURYSM REPAIR Right 04/26/2017   Procedure: 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;  Surgeon: Conrad [place], MD;  Location: [place];  Service: Vascular;  Laterality: Right;  . HAND SURGERY  12/2008   after hand fx/due to fall  . JOINT REPLACEMENT Bilateral 07/1998   knees  . KNEE ARTHROPLASTY  05/23/1998   total right  . LAPAROTOMY Bilateral 07/04/2012   Procedure: EXPLORATORY LAPAROTOMY TOTAL ABDOMINAL HYSTERECTOMY  BILATERAL SALPINGO-OOPHORECTOMY with small bowel resection ;  Surgeon: Alvino Chapel, MD;  Location: [facility];  Service: Gynecology;  Laterality: Bilateral;  . LEFT HEART CATH AND CORONARY ANGIOGRAPHY [name] 04/06/2017   Procedure: LEFT HEART CATH AND CORONARY ANGIOGRAPHY;  Surgeon: Jolaine Artist, MD;  Location: [place] [facility];  Service: Cardiovascular;  Laterality: [name];  . polyp removal  1999  . TUBAL LIGATION     Social History   Tobacco Use  . Smoking status: Never Smoker  . Smokeless tobacco: Never Used  Substance Use Topics  . Alcohol use: Never    Alcohol/week: 0.0 standard drinks    Frequency: Never  . Drug use: Never   Family History  Problem Relation Age of Onset  . Heart disease Father   . Heart attack Father   . Leukemia Brother   . Lung cancer Brother   . Leukemia Brother   . Stomach cancer Brother   . Heart disease Sister        CABG  . Stroke Sister   . Cancer Sister   . Bladder Cancer Sister   . Heart disease Brother        CABG  . Stroke Sister        "massive brain bleed"   Allergies  Allergen Reactions  . Allopurinol Nausea Only  . Lipitor [Atorvastatin Calcium] Other (See Comments)    Leg aches and aching all over  . Morphine And Related Other (See Comments)    Makes the patient "loopy,"   . Simvastatin Other (See Comments)    MUSCLE PAIN  . Tape Itching and Rash    "Cannot use paper tape" --- also causes blisters. Use plastic tape   Current Outpatient Medications on File Prior to Visit  Medication Sig Dispense Refill  . acetaminophen (TYLENOL) 650 MG CR tablet Take 650 mg by mouth every 8 (eight) hours as needed for pain.    [name]  amLODipine (NORVASC) 5 MG tablet TAKE ONE TABLET BY MOUTH DAILY AS NEEDED 90 tablet 2  . aspirin EC 81 MG tablet Take 1 tablet (81 mg total) by mouth daily. 30 tablet 3  . carvedilol (COREG) 25 MG tablet Take 1 tablet (25 mg total) by mouth 2 (two) times daily with a meal. 180 tablet 3  . Cholecalciferol  (VITAMIN D PO) Take by mouth.    [name] lisinopril (ZESTRIL) 5 MG tablet Take 1 tablet (5 mg total) by mouth daily. Call for Appointment 90 tablet 0  . loperamide (IMODIUM) 2 MG capsule Take 1 capsule (2 mg total) by mouth as needed for diarrhea or loose stools. 20 capsule 0  . losartan (COZAAR) 25 MG tablet TAKE ONE TABLET BY MOUTH DAILY 90 tablet 1  . nitroGLYCERIN (NITROSTAT) 0.4 MG SL tablet Place 1 tablet (0.4 mg total) under the tongue every 5 (five) minutes x 3 doses as needed for chest pain. 25 tablet 12  . rosuvastatin (CRESTOR) 10 MG tablet TAKE ONE TABLET BY MOUTH DAILY AT 6:00PM 90 tablet 0  . vitamin B-12 (CYANOCOBALAMIN) 1000 MCG tablet Take 1,000 mcg by mouth daily.     No current facility-administered medications on file prior to visit.      Review of Systems  Constitutional: Negative for activity change, appetite change, fatigue, fever and unexpected weight change.  HENT: Positive for hearing loss. Negative for congestion, ear pain, rhinorrhea, sinus pressure and sore throat.   Eyes: Negative for pain, redness and visual disturbance.  Respiratory: Negative for cough, shortness of breath and wheezing.   Cardiovascular: Negative for chest pain and palpitations.  Gastrointestinal: Negative for abdominal pain, blood in stool, constipation and diarrhea.  Endocrine: Negative for polydipsia and polyuria.  Genitourinary: Negative for dysuria, frequency and urgency.  Musculoskeletal: Positive for arthralgias. Negative for back pain and myalgias.  Skin: Negative for pallor and rash.  Allergic/Immunologic: Negative for environmental allergies.  Neurological: Negative for dizziness, syncope and headaches.  Hematological: Negative for adenopathy. Does not bruise/bleed easily.  Psychiatric/Behavioral: Negative for decreased concentration and dysphoric mood. The patient is not nervous/anxious.        Objective:   Physical Exam Constitutional:      General: She is not in acute distress.     Appearance: Normal appearance. She is well-developed. She is obese. She is not ill-appearing or diaphoretic.  HENT:     Head: Normocephalic and atraumatic.     Right Ear: Tympanic membrane, ear canal and external ear normal.     Left Ear: Tympanic membrane, ear canal and external ear normal.     Nose: Nose normal. No congestion.     Mouth/Throat:     Mouth: Mucous membranes are moist.     Pharynx: Oropharynx is clear. No posterior oropharyngeal erythema.  Eyes:     General: No scleral icterus.    Extraocular Movements: Extraocular movements intact.     Conjunctiva/sclera: Conjunctivae normal.     Pupils: Pupils are equal, round, and reactive to light.  Neck:     Musculoskeletal: Normal range of motion and neck supple. No neck rigidity or muscular tenderness.     Thyroid: No thyromegaly.     Vascular: No carotid bruit or JVD.  Cardiovascular:     Rate and Rhythm: Normal rate and regular rhythm.     Pulses: Normal pulses.     Heart sounds: Normal heart sounds. No gallop.   Pulmonary:     Effort: Pulmonary effort is normal. No respiratory  distress.     Breath sounds: Normal breath sounds. No wheezing or rales.     Comments: Good air exch No crackles Chest:     Chest wall: No tenderness.  Abdominal:     General: Bowel sounds are normal. There is no distension or abdominal bruit.     Palpations: Abdomen is soft. There is no mass.     Tenderness: There is no abdominal tenderness.     Hernia: No hernia is present.  Genitourinary:    Comments: Breast exam: No mass, nodules, thickening, tenderness, bulging, retraction, inflamation, nipple discharge or skin changes noted.  No axillary or clavicular LA.     Musculoskeletal: Normal range of motion.        General: No tenderness.     Right lower leg: No edema.     Left lower leg: No edema.  Lymphadenopathy:     Cervical: No cervical adenopathy.  Skin:    General: Skin is warm and dry.     Coloration: Skin is not pale.     Findings:  No erythema or rash.     Comments: Solar lentigines diffusely Some sks  Neurological:     Mental Status: She is alert. Mental status is at baseline.     Cranial Nerves: No cranial nerve deficit.     Motor: No abnormal muscle tone.     Coordination: Coordination normal.     Gait: Gait normal.     Deep Tendon Reflexes: Reflexes are normal and symmetric. Reflexes normal.  Psychiatric:        Mood and Affect: Mood normal.        Cognition and Memory: Cognition and memory normal.           Assessment & Plan:   Problem List Items Addressed This Visit      Cardiovascular and Mediastinum   Essential hypertension    bp in fair control at this time  BP Readings from Last 1 Encounters:  12/18/18 110/70   No changes needed Most recent labs reviewed  Disc lifstyle change with low sodium diet and exercise        Sick sinus syndrome ([place])    Followed by cardiology      RESOLVED: Pseudoaneurysm following procedure ([place])     Endocrine   Hypothyroidism    Hypothyroidism  Pt has no clinical changes No change in energy level/ hair or skin/ edema and no tremor Lab Results  Component Value Date   TSH 2.31 12/11/2018            Nervous and Auditory   Hearing loss in right ear    Doing well with hearing aide        Genitourinary   Chronic renal disease, stage III (H[facility])    Improved Cr of 1.31 Enc water intake- getting better at that  Urged to avoid renal toxic medications  Continue low dose lisinopril          Other   Prediabetes    In obese pt Lab Results  Component Value Date   HGBA1C 6.1 12/11/2018   disc imp of low glycemic diet and wt loss to prevent DM2       HYPERCHOLESTEROLEMIA, PURE    Disc goals for lipids and reasons to control them Rev last labs with pt Rev low sat fat diet in detail  Continue crestor and diet  LDL under 70       Obesity (BMI 30-39.9)    Discussed how this problem  influences overall health and the risks it imposes   Reviewed plan for weight loss with lower calorie diet (via better food choices and also portion control or program like weight watchers) and exercise building up to or more than 30 minutes 5 days per week including some aerobic activity         Encounter for Medicare annual wellness exam - Primary    Reviewed health habits including diet and exercise and skin cancer prevention Reviewed appropriate screening tests for age  Also reviewed health mt list, fam hx and immunization status , as well as social and family history   See HPI Labs reviewed  Declines colon or breast screening due to age  Declines shingrix  Declines dexa  Enc more exercise  Has advance directive No cognitive concerns  Continues to use hearing aides , overdue for eye exam (macular degeneration)  Enc lower carb diet and good fluid intake along with exercise as tolerated       Colon cancer screening    Declines at her age      Anemia    Improved Hb and ferritin -closer to normal  Taking bid iron  Will cut dose to daily       Relevant Medications   ferrous sulfate 325 (65 FE) MG tablet   Elevated transaminase level    Ast/alt are fairly stable along with alk phos  Suspect possible fatty liver  No symptoms  Enc her to minimize tylenol use and avoid etoh (which she does not drink) Will continue to follow

## 2018-12-18 NOTE — Assessment & Plan Note (Signed)
Discussed how this problem influences overall health and the risks it imposes  Reviewed plan for weight loss with lower calorie diet (via better food choices and also portion control or program like weight watchers) and exercise building up to or more than 30 minutes 5 days per week including some aerobic activity    

## 2018-12-18 NOTE — Assessment & Plan Note (Signed)
Ast/alt are fairly stable along with alk phos  Suspect possible fatty liver  No symptoms  Enc her to minimize tylenol use and avoid etoh (which she does not drink) Will continue to follow  

## 2018-12-18 NOTE — Assessment & Plan Note (Signed)
Declines at her age

## 2018-12-18 NOTE — Assessment & Plan Note (Signed)
Hypothyroidism  Pt has no clinical changes No change in energy level/ hair or skin/ edema and no tremor Lab Results  Component Value Date   TSH 2.31 12/11/2018

## 2019-01-13 ENCOUNTER — Other Ambulatory Visit: Payer: Self-pay | Admitting: Cardiology

## 2019-01-26 DIAGNOSIS — Z23 Encounter for immunization: Secondary | ICD-10-CM | POA: Diagnosis not present

## 2019-02-03 ENCOUNTER — Other Ambulatory Visit: Payer: Self-pay | Admitting: Cardiology

## 2019-03-06 ENCOUNTER — Other Ambulatory Visit: Payer: Self-pay | Admitting: Cardiology

## 2019-03-14 ENCOUNTER — Other Ambulatory Visit: Payer: Self-pay

## 2019-03-24 ENCOUNTER — Other Ambulatory Visit: Payer: Self-pay | Admitting: Cardiology

## 2019-04-07 ENCOUNTER — Other Ambulatory Visit: Payer: Self-pay | Admitting: Cardiology

## 2019-04-17 ENCOUNTER — Other Ambulatory Visit: Payer: Self-pay | Admitting: Cardiology

## 2019-04-21 ENCOUNTER — Other Ambulatory Visit: Payer: Self-pay | Admitting: Cardiology

## 2019-04-25 ENCOUNTER — Other Ambulatory Visit: Payer: Self-pay

## 2019-05-04 DIAGNOSIS — Z006 Encounter for examination for normal comparison and control in clinical research program: Secondary | ICD-10-CM

## 2019-05-04 NOTE — Research (Signed)
[employer] Research Study  2 Year Follow Up- phone  Patient doing well at this time, no adverse events since last follow up. We will follow up with the patient at there 3 year follow up.

## 2019-05-05 ENCOUNTER — Other Ambulatory Visit: Payer: Self-pay | Admitting: Cardiology

## 2019-05-13 ENCOUNTER — Other Ambulatory Visit: Payer: Self-pay | Admitting: Cardiology

## 2019-05-15 ENCOUNTER — Other Ambulatory Visit: Payer: Self-pay | Admitting: Cardiology

## 2019-06-05 ENCOUNTER — Other Ambulatory Visit: Payer: Self-pay | Admitting: Cardiology

## 2019-06-06 ENCOUNTER — Telehealth: Payer: Self-pay | Admitting: *Deleted

## 2019-06-06 MED ORDER — COLCHICINE 0.6 MG PO CAPS: 1.0000 | ORAL_CAPSULE | Freq: Two times a day (BID) | ORAL | 5 refills | Status: DC

## 2019-06-06 NOTE — Telephone Encounter (Signed)
I sent it  Please let her know

## 2019-06-06 NOTE — Telephone Encounter (Signed)
Received fax from insurance company saying that Colcrys tablets 0.6mg  is a tier 4 drug and might not be covered but the alternative Mitigare Capsules 0.6 mgs are covered at a Tier 3, pt's insurance requested we send in Rx to pt's pharmacy on file if Dr. Glori Bickers would approve the change   [employer]  Letter placed in Dr. Marliss Coots inbox for review

## 2019-06-06 NOTE — Telephone Encounter (Signed)
Left VM letting pt know 

## 2019-07-06 NOTE — Progress Notes (Signed)
Cardiology Office Note    Date:  07/11/2019   ID:  Valerie Ramirez, DOB 1932/10/12, MRN [id number]  PCP:  Valerie Greenspan, MD  Cardiologist:  Dr. Martinique  Chief Complaint  Patient presents with  . Coronary Artery Disease    History of Present Illness:  Valerie Ramirez is a 86 y.o. female with PMH of HTN, HLD, hypothyroidism and CAD.  Echocardiogram obtained on 04/05/2017 showed EF 55-60%, grade 2 DD, PA peak pressure 34 mmHg.  She was ruled in for NSTEMI.  She eventually underwent cardiac catheterization on 04/06/2017 which showed a 95% mid left circumflex lesion treated with 3.5 x 28 mm DES, there was a 40% ostial to proximal LAD residual and to 60% proximal RCA residual.  Post procedure, patient was placed on dual antiplatelet therapy.  Due to her intolerance of Lipitor and simvastatin, she was started on low-dose Crestor.    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 Bridgett Larsson on 04/26/2017.  Daughter at the time also mentioned some mental status change immediately after the discharge, they did not seek medical attention at the time.    MRI unfortunately did show punctate acute/early subacute microvascular infarct in the right anterior caudate body, chronic right occipital infarction, small chronic lacunar infarct in the right putamen extending into the mid corona radiata.  .  She was admitted in June 2019 after a fall and facial trauma with nasal fracture and subdural hematoma. DAPT was held. Later resumed as outpatient and repeat CT showed resolution of hematoma. Fall was felt to be related to orthostatic hypotension and antihypertensives were reduced with significant improvement in how she felt.   She is seen with her daughter today. She is feeling very well. Denies any chest pain, SOB, palpitations. Only gets dizzy if she bends over. No longer falling. She gets out and walks some when the weather is  good.    Past Medical History:  Diagnosis Date  . Arthritis    OA  . CAD (coronary artery disease)    stent December 2018  . Cancer ([facility])    endometrial  . GERD (gastroesophageal reflux disease)   . Hearing loss   . History of kidney stones   . Hyperlipidemia   . Hypertension   . Hypothyroid    "not in years"  . Shortness of breath dyspnea    04/25/17- not anymore    Past Surgical History:  Procedure Laterality Date  . ABDOMINAL HYSTERECTOMY    . ANTERIOR CERVICAL DECOMP/DISCECTOMY FUSION [name] 08/18/2015   Procedure: C5-6, C6-7 Anterior Cervical Discectomy and Fusion, Allograft, Plate;  Surgeon: Marybelle Killings, MD;  Location: [facility];  Service: Orthopedics;  Laterality: [name];  . bone spur removed Right    shoulder  . BOWEL RESECTION  07/04/2012   Procedure: SMALL BOWEL RESECTION;  Surgeon: Alvino Chapel, MD;  Location: [facility];  Service: Gynecology;;  . BREAST BIOPSY    . CHOLECYSTECTOMY    . CORONARY STENT INTERVENTION [name] 04/06/2017   Procedure: CORONARY STENT INTERVENTION;  Surgeon: Wellington Hampshire, MD;  Location: [facility];  Service: Cardiovascular;  Laterality: [name];  . DILATION AND CURETTAGE OF UTERUS  1999  . EYE SURGERY Bilateral    cataracts  . FALSE ANEURYSM REPAIR Right 04/26/2017   Procedure: 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;  Surgeon: Conrad Harrison, MD;  Location: [facility];  Service: Vascular;  Laterality: Right;  . HAND SURGERY  12/2008   after hand fx/due to fall  . JOINT REPLACEMENT Bilateral 07/1998   knees  . KNEE ARTHROPLASTY  05/23/1998   total right  . LAPAROTOMY Bilateral 07/04/2012   Procedure: EXPLORATORY LAPAROTOMY TOTAL ABDOMINAL HYSTERECTOMY BILATERAL SALPINGO-OOPHORECTOMY with small bowel resection ;  Surgeon: Alvino Chapel, MD;  Location: [facility];  Service: Gynecology;  Laterality: Bilateral;  . LEFT HEART CATH AND CORONARY ANGIOGRAPHY [name] 04/06/2017   Procedure: LEFT HEART CATH AND CORONARY ANGIOGRAPHY;  Surgeon:  Jolaine Artist, MD;  Location: [facility];  Service: Cardiovascular;  Laterality: [name];  . polyp removal  1999  . TUBAL LIGATION      Current Medications: Outpatient Medications Prior to Visit  Medication Sig Dispense Refill  . acetaminophen (TYLENOL) 650 MG CR tablet Take 650 mg by mouth every 8 (eight) hours as needed for pain.    [name] amLODipine (NORVASC) 5 MG tablet Take 1 tablet (5 mg total) by mouth daily. Please keep upcoming appt for refills. Thank you 30 tablet 1  . aspirin EC 81 MG tablet Take 1 tablet (81 mg total) by mouth daily. 30 tablet 3  . carvedilol (COREG) 25 MG tablet Take 1 tablet (25 mg total) by mouth 2 (two) times daily with a meal. Please keep upcoming appt for refills. Thank you 60 tablet 1  . Cholecalciferol (VITAMIN D PO) Take by mouth.    . Colchicine (MITIGARE) 0.6 MG CAPS Take 1 capsule by mouth 2 (two) times daily. 60 capsule 5  . DULoxetine (CYMBALTA) 30 MG capsule Take 1 capsule (30 mg total) by mouth daily. 90 capsule 3  . ferrous sulfate 325 (65 FE) MG tablet Take 1 tablet (325 mg total) by mouth daily with breakfast. 30 tablet 11  . loperamide (IMODIUM) 2 MG capsule Take 1 capsule (2 mg total) by mouth as needed for diarrhea or loose stools. 20 capsule 0  . losartan (COZAAR) 25 MG tablet TAKE ONE TABLET BY MOUTH DAILY 90 tablet 2  . nitroGLYCERIN (NITROSTAT) 0.4 MG SL tablet Place 1 tablet (0.4 mg total) under the tongue every 5 (five) minutes x 3 doses as needed for chest pain. 25 tablet 12  . rosuvastatin (CRESTOR) 10 MG tablet Take 1 tablet (10 mg total) by mouth daily. Please keep upcoming appt for refills. Thank you 30 tablet 1  . vitamin B-12 (CYANOCOBALAMIN) 1000 MCG tablet Take 1,000 mcg by mouth daily.    [name] lisinopril (ZESTRIL) 5 MG tablet TAKE ONE TABLET BY MOUTH DAILY 90 tablet 3   No facility-administered medications prior to visit.     Allergies:   Allopurinol, Lipitor [atorvastatin calcium], Morphine and related, Simvastatin, and  Tape   Social History   Socioeconomic History  . Marital status: Widowed    Spouse name: Not on file  . Number of children: 2  . Years of education: Not on file  . Highest education level: High school graduate  Occupational History  . Not on file  Tobacco Use  . Smoking status: Never Smoker  . Smokeless tobacco: Never Used  Substance and Sexual Activity  . Alcohol use: Never    Alcohol/week: 0.0 standard drinks  . Drug use: Never  . Sexual activity: Never    Birth control/protection: Post-menopausal  Other Topics Concern  . Not on file  Social History Narrative  . Not on file   Social Determinants of Health   Financial Resource Strain:   .  Difficulty of Paying Living Expenses:   Food Insecurity:   . Worried About Charity fundraiser in the Last Year:   . Arboriculturist in the Last Year:   Transportation Needs:   . Film/video editor (Medical):   [name] Lack of Transportation (Non-Medical):   Physical Activity:   . Days of Exercise per Week:   . Minutes of Exercise per Session:   Stress:   . Feeling of Stress :   Social Connections:   . Frequency of Communication with Friends and Family:   . Frequency of Social Gatherings with Friends and Family:   . Attends Religious Services:   . Active Member of Clubs or Organizations:   . Attends Archivist Meetings:   [name] Marital Status:      Family History:  The patient's family history includes Bladder Cancer in her sister; Cancer in her sister; Heart attack in her father; Heart disease in her brother, father, and sister; Leukemia in her brother and brother; Lung cancer in her brother; Stomach cancer in her brother; Stroke in her sister and sister.   ROS:   Please see the history of present illness.    ROS All other systems reviewed and are negative.   PHYSICAL EXAM:   VS:  BP 140/62   Pulse 63   Ht 5\' 2"  (1.575 m)   Wt 205 lb (93 kg)   SpO2 94%   BMI 37.49 kg/m    GENERAL:  Well appearing obese WF in  NAD HEENT:  PERRL, EOMI, sclera are clear. Oropharynx is clear. NECK:  No jugular venous distention, carotid upstroke brisk and symmetric, no bruits, no thyromegaly or adenopathy LUNGS:  Clear to auscultation bilaterally CHEST:  Unremarkable HEART:  RRR,  PMI not displaced or sustained,S1 and S2 within normal limits, no S3, no S4: no clicks, no rubs, no murmurs ABD:  Soft, nontender. BS +, no masses or bruits. No hepatomegaly, no splenomegaly EXT:  2 + pulses throughout, tr edema, no cyanosis no clubbing SKIN:  Warm and dry.  No rashes NEURO:  Alert and oriented x 3. Cranial nerves II through XII intact. PSYCH:  Cognitively intact    Wt Readings from Last 3 Encounters:  07/11/19 205 lb (93 kg)  12/18/18 202 lb 5 oz (91.8 kg)  03/14/18 203 lb (92.1 kg)      Studies/Labs Reviewed:   EKG:  EKG is ordered today. NSR with normal Ecg I have personally reviewed and interpreted this study.   Recent Labs: 12/11/2018: ALT 43; BUN 26; Creatinine, Ser 1.31; Hemoglobin 11.7; Platelets 169.0; Potassium 4.5; Sodium 138; TSH 2.31   Lipid Panel    Component Value Date/Time   CHOL 132 12/11/2018 1001   CHOL 123 05/18/2017 1154   TRIG 136.0 12/11/2018 1001   HDL 48.40 12/11/2018 1001   HDL 52 05/18/2017 1154   CHOLHDL 3 12/11/2018 1001   VLDL 27.2 12/11/2018 1001   LDLCALC 56 12/11/2018 1001   LDLCALC 28 05/18/2017 1154   LDLDIRECT 136.0 10/31/2015 0959    Additional studies/ records that were reviewed today include:   Cath 04/06/2017 Conclusion     Mid Cx lesion is 95% stenosed.  A drug-eluting stent was successfully placed using a STENT SVELTE RX 3.50 X 28MM.  Post intervention, there is a 0% residual stenosis.  Successful angioplasty and drug-eluting stent placement to the mid left circumflex.    Echo 04/05/2017 LV EF: 55% - 60%  Study Conclusions  - Left  ventricle: The cavity size was normal. Wall thickness was increased in a pattern of severe LVH.  Systolic function was normal. The estimated ejection fraction was in the range of 55% to 60%. Wall motion was normal; there were no regional wall motion abnormalities. Doppler parameters are consistent with pseudonormal left ventricular relaxation (grade 2 diastolic dysfunction). The E/e&' ratio is >15, suggesting elevated LV filling pressure. - Aortic valve: Sclerosis without stenosis. Transvalvular velocity was minimally increased. Mean gradient (S): 7 mm Hg. - Mitral valve: Mildly thickened leaflets . There was trivial regurgitation. - Left atrium: Moderately dilated. - Tricuspid valve: There was mild regurgitation. - Pulmonary arteries: PA peak pressure: 34 mm Hg (S). - Inferior vena cava: The vessel was normal in size. The respirophasic diameter changes were in the normal range (>= 50%), consistent with normal central venous pressure.  Impressions:  - LVEF 55-60%, severe LVH, normal wall motion, grade 2 DD with elevated LV filling pressure, aortic valve sclerosis, trivial MR, moderate LAE, mild TR, RVSP 34 mmHg, normal IVC.    ASSESSMENT:    1. Coronary artery disease involving native coronary artery of native heart without angina pectoris   2. H/O: CVA (cerebrovascular accident)   3. Essential hypertension   4. Mixed hyperlipidemia   5. Orthostatic hypotension      PLAN:  In order of problems listed above:  1. CAD: s/p DES of LCx for NSTEMI in Dec. 2018. Continue aspirin,  carvedilol, and Crestor. She is asymptomatic.   2. Hyperlipidemia: On Crestor 10 mg and tolerating well. Lipids look excellent. LDL 56.   3. Hypertension: Blood pressure is well controlled. History of orthostasis on more medication. Not sure why she is on lisinopril and losartan. Will stop lisinopril.  4.    CVA: Confirmed on MR post cath.  Clinically improved.  5.   Hypothyroidism: Managed by primary care provider.    6.   Recurrent falls with nasal fracture and  subdural hematoma- secondary to othostatic hypotension. Resolved.      Medication Adjustments/Labs and Tests Ordered: Current medicines are reviewed at length with the patient today.  Concerns regarding medicines are outlined above.  Medication changes, Labs and Tests ordered today are listed in the Patient Instructions below. Patient Instructions  Stop taking lisinopril   Continue your other medication.  Follow up in one year     Signed, Daniell Mancinas Martinique, MD  07/11/2019 2:49 PM    [facility] [address], [place], [place]  [address] Phone: [phone]; Fax: [phone]

## 2019-07-11 ENCOUNTER — Encounter: Payer: Self-pay | Admitting: Cardiology

## 2019-07-11 ENCOUNTER — Ambulatory Visit (INDEPENDENT_AMBULATORY_CARE_PROVIDER_SITE_OTHER): Payer: Medicare Other | Admitting: Cardiology

## 2019-07-11 ENCOUNTER — Other Ambulatory Visit: Payer: Self-pay

## 2019-07-11 VITALS — BP 140/62 | HR 63 | Ht 62.0 in | Wt 205.0 lb

## 2019-07-11 DIAGNOSIS — I2510 Atherosclerotic heart disease of native coronary artery without angina pectoris: Secondary | ICD-10-CM

## 2019-07-11 DIAGNOSIS — E782 Mixed hyperlipidemia: Secondary | ICD-10-CM

## 2019-07-11 DIAGNOSIS — I951 Orthostatic hypotension: Secondary | ICD-10-CM

## 2019-07-11 DIAGNOSIS — Z8673 Personal history of transient ischemic attack (TIA), and cerebral infarction without residual deficits: Secondary | ICD-10-CM

## 2019-07-11 DIAGNOSIS — I10 Essential (primary) hypertension: Secondary | ICD-10-CM | POA: Diagnosis not present

## 2019-07-11 MED ORDER — ROSUVASTATIN CALCIUM 10 MG PO TABS: 10.0000 mg | ORAL_TABLET | Freq: Every day | ORAL | 1 refills | Status: DC

## 2019-07-11 NOTE — Patient Instructions (Addendum)
Stop taking lisinopril   Continue your other medication.  Follow up in one year

## 2019-07-12 ENCOUNTER — Ambulatory Visit: Payer: Medicare Other | Attending: Internal Medicine

## 2019-07-12 DIAGNOSIS — Z23 Encounter for immunization: Secondary | ICD-10-CM

## 2019-07-12 NOTE — Progress Notes (Signed)
   Covid-19 Vaccination Clinic  Name:  CUBA HAGGLUND    MRN: [id number] DOB: December 22, 1932  07/12/2019  Ms. Allensworth was observed post Covid-19 immunization for 15 minutes without incident. She was provided with Vaccine Information Sheet and instruction to access the V-Safe system.   Ms. Laro was instructed to call 911 with any severe reactions post vaccine: [name] Difficulty breathing  . Swelling of face and throat  . A fast heartbeat  . A bad rash all over body  . Dizziness and weakness   Immunizations Administered    Name Date Dose VIS Date Route   Pfizer COVID-19 Vaccine 07/12/2019 12:28 PM 0.3 mL 03/30/2019 Intramuscular   Manufacturer: [place]   Lot: [id number]   [place]: [id number]

## 2019-07-25 ENCOUNTER — Other Ambulatory Visit: Payer: Self-pay

## 2019-07-25 MED ORDER — CARVEDILOL 25 MG PO TABS: 25.0000 mg | ORAL_TABLET | Freq: Two times a day (BID) | ORAL | 3 refills | Status: DC

## 2019-08-06 ENCOUNTER — Ambulatory Visit: Payer: Medicare Other | Attending: Internal Medicine

## 2019-08-06 DIAGNOSIS — Z23 Encounter for immunization: Secondary | ICD-10-CM

## 2019-08-06 NOTE — Progress Notes (Signed)
   Covid-19 Vaccination Clinic  Name:  Valerie Ramirez    MRN: [id number] DOB: 11/28/1932  08/06/2019  [name] was observed post Covid-19 immunization for 15 minutes without incident. She was provided with Vaccine Information Sheet and instruction to access the V-Safe system.   Ms. Water was instructed to call 911 with any severe reactions post vaccine: [name] Difficulty breathing  . Swelling of face and throat  . A fast heartbeat  . A bad rash all over body  . Dizziness and weakness   Immunizations Administered    Name Date Dose VIS Date Route   Pfizer COVID-19 Vaccine 08/06/2019 11:13 AM 0.3 mL 06/13/2018 Intramuscular   Manufacturer: [place]   Lot: [id number]   [place]: [id number]

## 2019-08-08 ENCOUNTER — Other Ambulatory Visit: Payer: Self-pay | Admitting: Cardiology

## 2019-10-31 ENCOUNTER — Other Ambulatory Visit: Payer: Self-pay | Admitting: Cardiology

## 2019-11-01 ENCOUNTER — Ambulatory Visit: Payer: Medicare Other | Attending: Internal Medicine

## 2019-11-01 DIAGNOSIS — Z20822 Contact with and (suspected) exposure to covid-19: Secondary | ICD-10-CM | POA: Diagnosis not present

## 2019-11-02 ENCOUNTER — Telehealth: Payer: Self-pay

## 2019-11-02 LAB — NOVEL CORONAVIRUS, NAA: SARS-CoV-2, NAA: DETECTED — AB

## 2019-11-02 LAB — SARS-COV-2, NAA 2 DAY TAT

## 2019-11-02 NOTE — Telephone Encounter (Signed)
[name] (DPR signed) said pt was notified today that has positive covid virus; pt was covid vaccinated with [place] vaccine 07/12/19 & 08/06/19. pts son was with pt this past weekend and he tested positive for covid this wk. Pt presently has temp 99.5, chills, all over muscle and joint pain, H/A now 8-9 pain level, pt is weak and fatigued; pt startes prod cough (does not know color of phlegm) on 11/01/19 and cough is worse today. Pt has SOB with walking which is a new symptom; pt not eating but trying to drink and last vomited on 11/01/19. Pt does not want to go to ED. Dr Glori Bickers said needs visit for someone to listen to lungs, possible CXR, and eval for dehydration. [name] voiced understanding and pt will go to [facility] ED. FYI to Dr Glori Bickers.

## 2019-11-02 NOTE — Telephone Encounter (Signed)
I will watch for correspondence Thanks

## 2019-11-03 ENCOUNTER — Ambulatory Visit (HOSPITAL_COMMUNITY)
Admission: RE | Admit: 2019-11-03 | Discharge: 2019-11-03 | Disposition: A | Discharge status: Home / Self Care | Payer: Medicare Other | Source: Ambulatory Visit | Attending: Pulmonary Disease | Admitting: Pulmonary Disease

## 2019-11-03 ENCOUNTER — Other Ambulatory Visit: Payer: Self-pay | Admitting: Adult Health

## 2019-11-03 DIAGNOSIS — U071 COVID-19: Secondary | ICD-10-CM

## 2019-11-03 DIAGNOSIS — Z23 Encounter for immunization: Secondary | ICD-10-CM | POA: Diagnosis not present

## 2019-11-03 MED ORDER — DIPHENHYDRAMINE HCL 50 MG/ML IJ SOLN: 50.0000 mg | Freq: Once | INTRAMUSCULAR | Status: DC | PRN

## 2019-11-03 MED ORDER — SODIUM CHLORIDE 0.9 % IV SOLN
Freq: Once | INTRAVENOUS | Status: AC
  Filled 2019-11-03: qty 600

## 2019-11-03 MED ORDER — SODIUM CHLORIDE 0.9 % IV SOLN: INTRAVENOUS | Status: DC | PRN

## 2019-11-03 MED ORDER — FAMOTIDINE IN NACL 20-0.9 MG/50ML-% IV SOLN: 20.0000 mg | Freq: Once | INTRAVENOUS | Status: DC | PRN

## 2019-11-03 MED ORDER — ALBUTEROL SULFATE HFA 108 (90 BASE) MCG/ACT IN AERS: 2.0000 | INHALATION_SPRAY | Freq: Once | RESPIRATORY_TRACT | Status: DC | PRN

## 2019-11-03 MED ORDER — EPINEPHRINE 0.3 MG/0.3ML IJ SOAJ: 0.3000 mg | Freq: Once | INTRAMUSCULAR | Status: DC | PRN

## 2019-11-03 MED ORDER — METHYLPREDNISOLONE SODIUM SUCC 125 MG IJ SOLR: 125.0000 mg | Freq: Once | INTRAMUSCULAR | Status: DC | PRN

## 2019-11-03 NOTE — Progress Notes (Signed)
  Diagnosis: COVID-19  Physician: Dr. Joya Gaskins  Procedure: Covid Infusion Clinic Med: casirivimab\imdevimab infusion - Provided patient with casirivimab\imdevimab fact sheet for patients, parents and caregivers prior to infusion.  Complications: No immediate complications noted.  Discharge: Discharged home   [place] 11/03/2019

## 2019-11-03 NOTE — Progress Notes (Signed)
I connected by phone with Valerie Ramirez on 11/03/2019 at 10:30 AM to discuss the potential use of a new treatment for mild to moderate COVID-19 viral infection in non-hospitalized patients.  This patient is a 86 y.o. female that meets the FDA criteria for Emergency Use Authorization of COVID monoclonal antibody casirivimab/imdevimab.  Has a (+) direct SARS-CoV-2 viral test result  Has mild or moderate COVID-19   Is NOT hospitalized due to COVID-19  Is within 10 days of symptom onset  Has at least one of the high risk factor(s) for progression to severe COVID-19 and/or hospitalization as defined in EUA.  Specific high risk criteria : Older age (>/= 84 yo)   I have spoken and communicated the following to the patient or parent/caregiver regarding COVID monoclonal antibody treatment:  1. FDA has authorized the emergency use for the treatment of mild to moderate COVID-19 in adults and pediatric patients with positive results of direct SARS-CoV-2 viral testing who are 3 years of age and older weighing at least 40 kg, and who are at high risk for progressing to severe COVID-19 and/or hospitalization.  2. The significant known and potential risks and benefits of COVID monoclonal antibody, and the extent to which such potential risks and benefits are unknown.  3. Information on available alternative treatments and the risks and benefits of those alternatives, including clinical trials.  4. Patients treated with COVID monoclonal antibody should continue to self-isolate and use infection control measures (e.g., wear mask, isolate, social distance, avoid sharing personal items, clean and disinfect "high touch" surfaces, and frequent handwashing) according to CDC guidelines.   5. The patient or parent/caregiver has the option to accept or refuse COVID monoclonal antibody treatment.  After reviewing this information with the patient, The patient agreed to proceed with receiving  casirivimab\imdevimab infusion and will be provided a copy of the Fact sheet prior to receiving the infusion. Scot Dock 11/03/2019 10:30 AM

## 2019-11-03 NOTE — Discharge Instructions (Signed)

## 2019-12-05 ENCOUNTER — Telehealth: Payer: Self-pay | Admitting: Cardiology

## 2019-12-05 NOTE — Telephone Encounter (Signed)
Returned call to daughter. Patient has passed out twice this afternoon. BP was 112/50 after she passed out.  EMS was not consulted.  She did not hit her head when she passed out today  BP now: 115/58 - this is low for patient, per daughter. She states she has lost weight - thinks she is on too much medication  Patient reports dizzy spells for a while  Advised that ED may be most appropriate, as syncope can be due to a variety of causes such as electrolyte imbalances, rhythm issues, low BP.   She does not want to take her to ED  Scheduled for visit with [facility] FNP tomorrow @ 1:45pm

## 2019-12-05 NOTE — Progress Notes (Signed)
Cardiology Clinic Note   Patient Name: Valerie Ramirez Date of Encounter: 12/06/2019  Primary Care Provider:  Abner Greenspan, MD Primary Cardiologist:  Peter Martinique, MD  Patient Profile    Valerie Ramirez 86 year old female presents today for an evaluation of her syncope.  Past Medical History    Past Medical History:  Diagnosis Date  . Arthritis    OA  . CAD (coronary artery disease)    stent December 2018  . Cancer ([place])    endometrial  . GERD (gastroesophageal reflux disease)   . Hearing loss   . History of kidney stones   . Hyperlipidemia   . Hypertension   . Hypothyroid    "not in years"  . Shortness of breath dyspnea    04/25/17- not anymore   Past Surgical History:  Procedure Laterality Date  . ABDOMINAL HYSTERECTOMY    . ANTERIOR CERVICAL DECOMP/DISCECTOMY FUSION [name] 08/18/2015   Procedure: C5-6, C6-7 Anterior Cervical Discectomy and Fusion, Allograft, Plate;  Surgeon: Marybelle Killings, MD;  Location: [facility];  Service: Orthopedics;  Laterality: [name];  . bone spur removed Right    shoulder  . BOWEL RESECTION  07/04/2012   Procedure: SMALL BOWEL RESECTION;  Surgeon: Alvino Chapel, MD;  Location: [facility];  Service: Gynecology;;  . BREAST BIOPSY    . CHOLECYSTECTOMY    . CORONARY STENT INTERVENTION [name] 04/06/2017   Procedure: CORONARY STENT INTERVENTION;  Surgeon: Wellington Hampshire, MD;  Location: [facility];  Service: Cardiovascular;  Laterality: [name];  . DILATION AND CURETTAGE OF UTERUS  1999  . EYE SURGERY Bilateral    cataracts  . FALSE ANEURYSM REPAIR Right 04/26/2017   Procedure: 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;  Surgeon: Conrad Pillsbury, MD;  Location: [facility];  Service: Vascular;  Laterality: Right;  . HAND SURGERY  12/2008   after hand fx/due to fall  . JOINT REPLACEMENT Bilateral 07/1998   knees  . KNEE ARTHROPLASTY  05/23/1998   total right  . LAPAROTOMY Bilateral 07/04/2012   Procedure: EXPLORATORY LAPAROTOMY TOTAL ABDOMINAL  HYSTERECTOMY BILATERAL SALPINGO-OOPHORECTOMY with small bowel resection ;  Surgeon: Alvino Chapel, MD;  Location: [facility];  Service: Gynecology;  Laterality: Bilateral;  . LEFT HEART CATH AND CORONARY ANGIOGRAPHY [name] 04/06/2017   Procedure: LEFT HEART CATH AND CORONARY ANGIOGRAPHY;  Surgeon: Jolaine Artist, MD;  Location: [facility];  Service: Cardiovascular;  Laterality: [name];  . polyp removal  1999  . TUBAL LIGATION      Allergies  Allergies  Allergen Reactions  . Allopurinol Nausea Only  . Lipitor [Atorvastatin Calcium] Other (See Comments)    Leg aches and aching all over  . Morphine And Related Other (See Comments)    Makes the patient "loopy,"   . Simvastatin Other (See Comments)    MUSCLE PAIN  . Tape Itching and Rash    "Cannot use paper tape" --- also causes blisters. Use plastic tape    History of Present Illness    Ms. Denunzio has a PMH of essential hypertension, unstable angina, sick sinus syndrome, radial artery injury, CAD status post PCA, HLD, hypothyroidism, OA, DJD, CKD stage III, cardiomyopathy, and CVA.  Echocardiogram 12/18 showed an LVEF of 55-60%, G2 DD, PA peak pressure is 34 mmHg.  She was ruled in for NSTEMI and underwent LHC on 12/18 which showed 95% mid left circumflex which was treated with DES, 40% ostial to proximal LAD residual and 60% proximal  RCA residual.  She was intolerant of Lipitor and simvastatin and was placed on low-dose rosuvastatin.  She was noted to have a large right radial pseudoaneurysm during her 04/22/2017 visit.  It was repaired 04/26/2017.  She was noted to have mental status changes after discharge.  However they did not seek medical attention.  She underwent MRI that did show punctate acute/early subacute microvascular infarct in her right anterior caudate body, chronic right occipital infarction, and small chronic lacunar infarct.  She was admitted 6/19 after a fall and facial trauma with nasal fracture and subdural  hematoma.  Her DAPT was held at that time.  Was later resumed as an outpatient and a repeat CT showed resolution of hematoma.  Her fall was felt to be related to orthostatic hypotension and antihypertensives were reduced at that time.  She showed significant improvement.  She was last seen by Dr. Martinique on 07/11/2019.  During that time she denied chest pain, shortness of breath, and palpitations.  She would get dizzy with bending over.  She denied any further falls.  She was walking occasionally when the weather was nice.  COVID-19 positive test 11/01/2019  She contacted nurse triage line on 12/05/2019 and indicated that she had symptoms of lightheadedness and had passed out 2 times that day.  Her blood pressure was 112/50 after passing out.  EMS was consulted, she was advised to present to the emergency department.  She deferred and was scheduled for a clinic visit.  She presents to the clinic today and states she has been somewhat lightheaded since having Covid 1 month ago.  She states that she was lifting gas cans into her trunk and passed out.  She states that the second event happened within 5 minutes.  With a first episode she indicated that she felt lightheadedness and with the second episode her passing out was much more sudden.  Her blood pressure at the time of the event was 112/50.  She indicates that she has been drinking lots of fluids and feels she has recovered well from her COVID-19 infection.  She does notice that she does not have quite as much energy as she did prior to the infection.  I will decrease her losartan to 12.5.  She was also taking lisinopril we will stop her lisinopril I will also order CBC and BMP.  We will have her follow-up in 1 month she was offered a 7-day ZIO monitor but wishes to defer at this time.  Today she denies chest pain, shortness of breath, lower extremity edema, fatigue, palpitations, melena, hematuria, hemoptysis, diaphoresis, weakness, presyncope, syncope,  orthopnea, and PND.   Home Medications    Prior to Admission medications   Medication Sig Start Date End Date Taking? Authorizing Provider  acetaminophen (TYLENOL) 650 MG CR tablet Take 650 mg by mouth every 8 (eight) hours as needed for pain.    [provider]  amLODipine (NORVASC) 5 MG tablet TAKE ONE TABLET BY MOUTH DAILY . PLEASE KEEP UPCOMING APPOINTMENT FOR REFILLS 11/01/19   Erlene Quan, PA-C  aspirin EC 81 MG tablet Take 1 tablet (81 mg total) by mouth daily. 04/06/18   Erlene Quan, PA-C  carvedilol (COREG) 25 MG tablet Take 1 tablet (25 mg total) by mouth 2 (two) times daily with a meal. 07/25/19   Martinique, Peter M, MD  Cholecalciferol (VITAMIN D PO) Take by mouth.    [provider]  Colchicine (MITIGARE) 0.6 MG CAPS Take 1 capsule by mouth 2 (  two) times daily. 06/06/19   Tower, Wynelle Fanny, MD  DULoxetine (CYMBALTA) 30 MG capsule Take 1 capsule (30 mg total) by mouth daily. 12/18/18   Tower, Wynelle Fanny, MD  ferrous sulfate 325 (65 FE) MG tablet Take 1 tablet (325 mg total) by mouth daily with breakfast. 12/18/18   Tower, Wynelle Fanny, MD  loperamide (IMODIUM) 2 MG capsule Take 1 capsule (2 mg total) by mouth as needed for diarrhea or loose stools. 04/29/16   Eugenie Filler, MD  losartan (COZAAR) 25 MG tablet TAKE ONE TABLET BY MOUTH DAILY 04/09/19   Martinique, Peter M, MD  nitroGLYCERIN (NITROSTAT) 0.4 MG SL tablet Place 1 tablet (0.4 mg total) under the tongue every 5 (five) minutes x 3 doses as needed for chest pain. 04/09/17   [name], [name], PA-C  rosuvastatin (CRESTOR) 10 MG tablet TAKE ONE TABLET BY MOUTH DAILY. PLEASE KEEP UPCOMING APPOINTMENT FOR REFILLS 11/01/19   Martinique, Peter M, MD  vitamin B-12 (CYANOCOBALAMIN) 1000 MCG tablet Take 1,000 mcg by mouth daily.    [provider]    Family History    Family History  Problem Relation Age of Onset  . Heart disease Father   . Heart attack Father   . Leukemia Brother   . Lung cancer Brother   . Leukemia  Brother   . Stomach cancer Brother   . Heart disease Sister        CABG  . Stroke Sister   . Cancer Sister   . Bladder Cancer Sister   . Heart disease Brother        CABG  . Stroke Sister        "massive brain bleed"   She indicated that her mother is deceased. She indicated that her father is deceased. She indicated that both of her sisters are deceased. She indicated that three of her four brothers are deceased.  Social History    Social History   Socioeconomic History  . Marital status: Widowed    Spouse name: Not on file  . Number of children: 2  . Years of education: Not on file  . Highest education level: High school graduate  Occupational History  . Not on file  Tobacco Use  . Smoking status: Never Smoker  . Smokeless tobacco: Never Used  Vaping Use  . Vaping Use: Never used  Substance and Sexual Activity  . Alcohol use: Never    Alcohol/week: 0.0 standard drinks  . Drug use: Never  . Sexual activity: Never    Birth control/protection: Post-menopausal  Other Topics Concern  . Not on file  Social History Narrative  . Not on file   Social Determinants of Health   Financial Resource Strain:   . Difficulty of Paying Living Expenses: Not on file  Food Insecurity:   . Worried About Charity fundraiser in the Last Year: Not on file  . Ran Out of Food in the Last Year: Not on file  Transportation Needs:   . Lack of Transportation (Medical): Not on file  . Lack of Transportation (Non-Medical): Not on file  Physical Activity:   . Days of Exercise per Week: Not on file  . Minutes of Exercise per Session: Not on file  Stress:   . Feeling of Stress : Not on file  Social Connections:   . Frequency of Communication with Friends and Family: Not on file  . Frequency of Social Gatherings with Friends and Family: Not on file  . Attends  Religious Services: Not on file  . Active Member of Clubs or Organizations: Not on file  . Attends Archivist Meetings: Not  on file  . Marital Status: Not on file  Intimate Partner Violence:   . Fear of Current or Ex-Partner: Not on file  . Emotionally Abused: Not on file  . Physically Abused: Not on file  . Sexually Abused: Not on file     Review of Systems    General:  No chills, fever, night sweats or weight changes.  Cardiovascular:  No chest pain, dyspnea on exertion, edema, orthopnea, palpitations, paroxysmal nocturnal dyspnea. Dermatological: No rash, lesions/masses Respiratory: No cough, dyspnea Urologic: No hematuria, dysuria Abdominal:   No nausea, vomiting, diarrhea, bright red blood per rectum, melena, or hematemesis Neurologic:  No visual changes, wkns, changes in mental status. All other systems reviewed and are otherwise negative except as noted above.  Physical Exam    VS:  BP (!) 114/54   Pulse 99   Ht 5\' 2"  (1.575 m)   Wt 208 lb (94.3 kg)   SpO2 (!) 67%   BMI 38.04 kg/m  , BMI Body mass index is 38.04 kg/m. GEN: Well nourished, well developed, in no acute distress. HEENT: normal. Neck: Supple, no JVD, carotid bruits, or masses. Cardiac: RRR, no murmurs, rubs, or gallops. No clubbing, cyanosis, edema.  Radials/DP/PT 2+ and equal bilaterally.  Respiratory:  Respirations regular and unlabored, clear to auscultation bilaterally. GI: Soft, nontender, nondistended, BS + x 4. MS: no deformity or atrophy. Skin: warm and dry, no rash. Neuro:  Strength and sensation are intact. Psych: Normal affect.  Accessory Clinical Findings    Recent Labs: 12/11/2018: ALT 43; BUN 26; Creatinine, Ser 1.31; Hemoglobin 11.7; Platelets 169.0; Potassium 4.5; Sodium 138; TSH 2.31   Recent Lipid Panel    Component Value Date/Time   CHOL 132 12/11/2018 1001   CHOL 123 05/18/2017 1154   TRIG 136.0 12/11/2018 1001   HDL 48.40 12/11/2018 1001   HDL 52 05/18/2017 1154   CHOLHDL 3 12/11/2018 1001   VLDL 27.2 12/11/2018 1001   LDLCALC 56 12/11/2018 1001   LDLCALC 28 05/18/2017 1154   LDLDIRECT 136.0  10/31/2015 0959    ECG personally reviewed by me today-none today.    Echocardiogram 04/05/2017 Study Conclusions   - Left ventricle: The cavity size was normal. Wall thickness was  increased in a pattern of severe LVH. Systolic function was  normal. The estimated ejection fraction was in the range of 55%  to 60%. Wall motion was normal; there were no regional wall  motion abnormalities. Doppler parameters are consistent with  pseudonormal left ventricular relaxation (grade 2 diastolic  dysfunction). The E/e&' ratio is >15, suggesting elevated LV  filling pressure.  - Aortic valve: Sclerosis without stenosis. Transvalvular velocity  was minimally increased. Mean gradient (S): 7 mm Hg.  - Mitral valve: Mildly thickened leaflets . There was trivial  regurgitation.  - Left atrium: Moderately dilated.  - Tricuspid valve: There was mild regurgitation.  - Pulmonary arteries: PA peak pressure: 34 mm Hg (S).  - Inferior vena cava: The vessel was normal in size. The  respirophasic diameter changes were in the normal range (>= 50%),  consistent with normal central venous pressure.   Assessment & Plan   1.  Syncope/loss of consciousness-heart rate today 99.  Blood pressure today 114/54.had 2 episodes of syncope with loss of consciousness 12/05/2019.  COVID-19 positive on 11/01/2019.  EMS consulted and no head trauma reported.  No further episodes.  Offered 7-day Zio patch wishes to defer at this time. Order CBC/BMP  Increase p.o. hydration Lower extremity support stockings discussed Move slowly when changing positions.  Essential hypertension-blood pressure today 114/54.  112/50 at home after syncopal event Stop lisinopril Decrease losartan to 12.5 Continue carvedilol Increase p.o. fluids  Hyperlipidemia-LDL 56 on 8/20 Continue rosuvastatin Heart healthy low-sodium high-fiber diet Maintain physical activity Repeat lipid panel  Hypothyroidism-TSH 2.31 on  12/11/2018 Followed by PCP  Disposition: Follow-up with Dr. Martinique or me in 1 month.   Jossie Ng. Brenley Priore NP-C    12/06/2019, 2:17 PM [facility] [address] Office [phone] Fax [phone]  Notice: This dictation was prepared with Dragon dictation along with smaller phrase technology. Any transcriptional errors that result from this process are unintentional and may not be corrected upon review.

## 2019-12-05 NOTE — Telephone Encounter (Signed)
    Pt c/o Syncope: STAT if syncope occurred within 30 minutes and pt complains of lightheadedness High Priority if episode of passing out, completely, today or in last 24 hours   1. Did you pass out today? Yes    2. When is the last time you passed out? 2 x today  3. Has this occurred multiple times? yes  4. Did you have any symptoms prior to passing out? Dizziness and BP is 112/50 after passing out

## 2019-12-06 ENCOUNTER — Ambulatory Visit (INDEPENDENT_AMBULATORY_CARE_PROVIDER_SITE_OTHER): Payer: Medicare Other | Admitting: General Practice

## 2019-12-06 ENCOUNTER — Other Ambulatory Visit: Payer: Self-pay

## 2019-12-06 ENCOUNTER — Encounter: Payer: Self-pay | Admitting: General Practice

## 2019-12-06 VITALS — BP 114/54 | HR 99 | Ht 62.0 in | Wt 208.0 lb

## 2019-12-06 DIAGNOSIS — Z79899 Other long term (current) drug therapy: Secondary | ICD-10-CM | POA: Diagnosis not present

## 2019-12-06 DIAGNOSIS — E039 Hypothyroidism, unspecified: Secondary | ICD-10-CM

## 2019-12-06 DIAGNOSIS — I10 Essential (primary) hypertension: Secondary | ICD-10-CM

## 2019-12-06 DIAGNOSIS — E782 Mixed hyperlipidemia: Secondary | ICD-10-CM | POA: Diagnosis not present

## 2019-12-06 DIAGNOSIS — R55 Syncope and collapse: Secondary | ICD-10-CM | POA: Diagnosis not present

## 2019-12-06 MED ORDER — LOSARTAN POTASSIUM 25 MG PO TABS: 12.5000 mg | ORAL_TABLET | Freq: Every day | ORAL | 2 refills | Status: DC

## 2019-12-06 NOTE — Patient Instructions (Signed)
Medication Instructions:  STOP LISINOPRIL  DECREASE LOSARTAN 12.5 MG DAILY *If you need a refill on your cardiac medications before your next appointment, please call your pharmacy*  Lab Work: CBC AND BMET TODAY If you have labs (blood work) drawn today and your tests are completely normal, you will receive your results only by:  [place] (if you have MyChart) OR A paper copy in the mail.  If you have any lab test that is abnormal or we need to change your treatment, we will call you to review the results. You may go to any [facility] that is convenient for you however, we do have a lab in our office that is able to assist you. You DO NOT need an appointment for our lab. The lab is open 8:00am and closes at 4:00pm. Lunch 12:45 - 1:45pm.  Special Instructions INCREASE FLUID INTAKE  PLEASE INCREASE PHYSICAL ACTIVITY SLOWLY  Follow-Up: Your next appointment:  1 month(s)  In Person with Peter Martinique, MD [place], FNP  At [facility], you and your health needs are our priority.  As part of our continuing mission to provide you with exceptional heart care, we have created designated Provider Care Teams.  These Care Teams include your primary Cardiologist (physician) and Advanced Practice Providers (APPs -  Physician Assistants and Nurse Practitioners) who all work together to provide you with the care you need, when you need it.  We recommend signing up for the patient portal called "MyChart".  Sign up information is provided on this After Visit Summary.  MyChart is used to connect with patients for Virtual Visits (Telemedicine).  Patients are able to view lab/test results, encounter notes, upcoming appointments, etc.  Non-urgent messages can be sent to your provider as well.   To learn more about what you can do with MyChart, go to [email or web address].

## 2019-12-07 LAB — CBC
Hematocrit: 38.3 % (ref 34.0–46.6)
Hemoglobin: 12.2 g/dL (ref 11.1–15.9)
MCH: 29.7 pg (ref 26.6–33.0)
MCHC: 31.9 g/dL (ref 31.5–35.7)
MCV: 93 fL (ref 79–97)
Platelets: 205 10*3/uL (ref 150–450)
RBC: 4.11 x10E6/uL (ref 3.77–5.28)
RDW: 15.2 % (ref 11.7–15.4)
WBC: 9.7 10*3/uL (ref 3.4–10.8)

## 2019-12-07 LAB — BASIC METABOLIC PANEL
BUN/Creatinine Ratio: 23 (ref 12–28)
BUN: 32 mg/dL — ABNORMAL HIGH (ref 8–27)
CO2: 23 mmol/L (ref 20–29)
Calcium: 9.5 mg/dL (ref 8.7–10.3)
Chloride: 103 mmol/L (ref 96–106)
Creatinine, Ser: 1.41 mg/dL — ABNORMAL HIGH (ref 0.57–1.00)
GFR calc Af Amer: 39 mL/min/{1.73_m2} — ABNORMAL LOW (ref >59)
GFR calc non Af Amer: 34 mL/min/{1.73_m2} — ABNORMAL LOW (ref >59)
Glucose: 134 mg/dL — ABNORMAL HIGH (ref 65–99)
Potassium: 5.7 mmol/L — ABNORMAL HIGH (ref 3.5–5.2)
Sodium: 140 mmol/L (ref 134–144)

## 2019-12-10 ENCOUNTER — Other Ambulatory Visit: Payer: Self-pay

## 2019-12-10 DIAGNOSIS — Z79899 Other long term (current) drug therapy: Secondary | ICD-10-CM

## 2019-12-10 DIAGNOSIS — E875 Hyperkalemia: Secondary | ICD-10-CM

## 2019-12-12 ENCOUNTER — Other Ambulatory Visit: Payer: Self-pay

## 2019-12-12 DIAGNOSIS — E875 Hyperkalemia: Secondary | ICD-10-CM

## 2019-12-12 DIAGNOSIS — Z79899 Other long term (current) drug therapy: Secondary | ICD-10-CM

## 2019-12-12 LAB — BASIC METABOLIC PANEL
BUN/Creatinine Ratio: 19 (ref 12–28)
BUN: 30 mg/dL — ABNORMAL HIGH (ref 8–27)
CO2: 21 mmol/L (ref 20–29)
Calcium: 9.5 mg/dL (ref 8.7–10.3)
Chloride: 105 mmol/L (ref 96–106)
Creatinine, Ser: 1.56 mg/dL — ABNORMAL HIGH (ref 0.57–1.00)
GFR calc Af Amer: 35 mL/min/{1.73_m2} — ABNORMAL LOW (ref >59)
GFR calc non Af Amer: 30 mL/min/{1.73_m2} — ABNORMAL LOW (ref >59)
Glucose: 136 mg/dL — ABNORMAL HIGH (ref 65–99)
Potassium: 5.6 mmol/L — ABNORMAL HIGH (ref 3.5–5.2)
Sodium: 140 mmol/L (ref 134–144)

## 2019-12-14 NOTE — Addendum Note (Signed)
Addended by: Waylan Rocher on: 12/14/2019 10:25 AM   Modules accepted: Orders

## 2019-12-17 DIAGNOSIS — Z79899 Other long term (current) drug therapy: Secondary | ICD-10-CM | POA: Diagnosis not present

## 2019-12-17 DIAGNOSIS — E875 Hyperkalemia: Secondary | ICD-10-CM | POA: Diagnosis not present

## 2019-12-18 ENCOUNTER — Telehealth: Payer: Self-pay | Admitting: Internal Medicine

## 2019-12-18 ENCOUNTER — Telehealth: Payer: Self-pay | Admitting: *Deleted

## 2019-12-18 DIAGNOSIS — I10 Essential (primary) hypertension: Secondary | ICD-10-CM

## 2019-12-18 DIAGNOSIS — E875 Hyperkalemia: Secondary | ICD-10-CM

## 2019-12-18 LAB — BASIC METABOLIC PANEL
BUN/Creatinine Ratio: 25 (ref 12–28)
BUN: 42 mg/dL — ABNORMAL HIGH (ref 8–27)
CO2: 23 mmol/L (ref 20–29)
Calcium: 9.3 mg/dL (ref 8.7–10.3)
Chloride: 104 mmol/L (ref 96–106)
Creatinine, Ser: 1.66 mg/dL — ABNORMAL HIGH (ref 0.57–1.00)
GFR calc Af Amer: 32 mL/min/{1.73_m2} — ABNORMAL LOW (ref >59)
GFR calc non Af Amer: 28 mL/min/{1.73_m2} — ABNORMAL LOW (ref >59)
Glucose: 164 mg/dL — ABNORMAL HIGH (ref 65–99)
Potassium: 5.8 mmol/L (ref 3.5–5.2)
Sodium: 139 mmol/L (ref 134–144)

## 2019-12-18 MED ORDER — HYDROCHLOROTHIAZIDE 12.5 MG PO TABS
12.5000 mg | ORAL_TABLET | Freq: Every day | ORAL | 1 refills | Status: DC
Start: 2019-12-18 — End: 2020-02-14

## 2019-12-18 NOTE — Telephone Encounter (Signed)
REceived call from [facility]   K 5.8     Review of records Pt recovering from [place] Not that losartan was stopped   Impression:  Hyperkalemia  RElatively new  REcomm 1  REview diet to make sure not eating high potassium foods 2.  Needs cortisol level checked STAT 3  Hold amlodipine   Start HCTZ 12.5 mg   Follow BP  4  Wll need close f/u

## 2019-12-18 NOTE — Telephone Encounter (Signed)
-----   Message from Deberah Pelton, NP sent at 12/18/2019  6:25 AM EDT ----- Has been reviewed by Dr. Harrington Challenger and myself.  Dr. Harrington Challenger recommends  1. REview diet to make sure not eating high potassium foods 2.  Needs cortisol level checked STAT 3  Hold amlodipine   Start HCTZ 12.5 mg   Follow BP  4  Wll need close f/u   Also please repeat BMP today.  Thank you.

## 2019-12-18 NOTE — Telephone Encounter (Signed)
Dr Jordan pt 

## 2019-12-18 NOTE — Telephone Encounter (Signed)
Spoke with daughter and patient avoiding high potassium foods She will bring patient today and have repeat labs Rx sent to [facility] Discussed with Blima Ledger and will give patient sample of Lokelma 10 g per packet 1 time dose for patient to use if potassium remains elevated  Daughter aware will be at front for pick up

## 2019-12-19 LAB — CORTISOL: Cortisol: 6.3 ug/dL

## 2019-12-19 LAB — BASIC METABOLIC PANEL
BUN/Creatinine Ratio: 21 (ref 12–28)
BUN: 30 mg/dL — ABNORMAL HIGH (ref 8–27)
CO2: 24 mmol/L (ref 20–29)
Calcium: 9.4 mg/dL (ref 8.7–10.3)
Chloride: 103 mmol/L (ref 96–106)
Creatinine, Ser: 1.45 mg/dL — ABNORMAL HIGH (ref 0.57–1.00)
GFR calc Af Amer: 38 mL/min/{1.73_m2} — ABNORMAL LOW (ref >59)
GFR calc non Af Amer: 33 mL/min/{1.73_m2} — ABNORMAL LOW (ref >59)
Glucose: 191 mg/dL — ABNORMAL HIGH (ref 65–99)
Potassium: 5.3 mmol/L — ABNORMAL HIGH (ref 3.5–5.2)
Sodium: 139 mmol/L (ref 134–144)

## 2019-12-30 ENCOUNTER — Other Ambulatory Visit: Payer: Self-pay | Admitting: Family Medicine

## 2019-12-31 NOTE — Telephone Encounter (Signed)
Pt said she takes med daily but she isn't sure if it's okay with nephrologist, she said she hasn't asked them recently. Pt said she just picked up a refill so she doesn't need this filled again. Rx declined but I still asked pt to check in with nephrologist to see if they are okay with her taking med. Pt verbalized understanding.

## 2019-12-31 NOTE — Telephone Encounter (Signed)
Last filled on 06/06/19 #60 caps with 5 refills, last OV was 12/18/18 (over a year) and no future appts., please advise

## 2019-12-31 NOTE — Telephone Encounter (Signed)
I pended it to send She has kidney insufficiency- please verify with pt again that her nephrologist is aware she takes it before sending  Thanks

## 2020-01-09 NOTE — Progress Notes (Signed)
Cardiology Clinic Note   Patient Name: Valerie Ramirez Date of Encounter: 01/11/2020  Primary Care Provider:  Abner Greenspan, MD Primary Cardiologist:  Peter Martinique, MD  Patient Profile    Valerie Ramirez 86 year old female presents today for a follow-up evaluation of her syncope and hyperkalemia.  Past Medical History    Past Medical History:  Diagnosis Date  . Arthritis    OA  . CAD (coronary artery disease)    stent December 2018  . Cancer ([place])    endometrial  . GERD (gastroesophageal reflux disease)   . Hearing loss   . History of kidney stones   . Hyperlipidemia   . Hypertension   . Hypothyroid    "not in years"  . Shortness of breath dyspnea    04/25/17- not anymore   Past Surgical History:  Procedure Laterality Date  . ABDOMINAL HYSTERECTOMY    . ANTERIOR CERVICAL DECOMP/DISCECTOMY FUSION [name] 08/18/2015   Procedure: C5-6, C6-7 Anterior Cervical Discectomy and Fusion, Allograft, Plate;  Surgeon: Marybelle Killings, MD;  Location: [facility];  Service: Orthopedics;  Laterality: [name];  . bone spur removed Right    shoulder  . BOWEL RESECTION  07/04/2012   Procedure: SMALL BOWEL RESECTION;  Surgeon: Alvino Chapel, MD;  Location: [facility];  Service: Gynecology;;  . BREAST BIOPSY    . CHOLECYSTECTOMY    . CORONARY STENT INTERVENTION [name] 04/06/2017   Procedure: CORONARY STENT INTERVENTION;  Surgeon: Wellington Hampshire, MD;  Location: [facility];  Service: Cardiovascular;  Laterality: [name];  . DILATION AND CURETTAGE OF UTERUS  1999  . EYE SURGERY Bilateral    cataracts  . FALSE ANEURYSM REPAIR Right 04/26/2017   Procedure: 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;  Surgeon: Conrad Bronwood, MD;  Location: [facility];  Service: Vascular;  Laterality: Right;  . HAND SURGERY  12/2008   after hand fx/due to fall  . JOINT REPLACEMENT Bilateral 07/1998   knees  . KNEE ARTHROPLASTY  05/23/1998   total right  . LAPAROTOMY Bilateral 07/04/2012   Procedure: EXPLORATORY  LAPAROTOMY TOTAL ABDOMINAL HYSTERECTOMY BILATERAL SALPINGO-OOPHORECTOMY with small bowel resection ;  Surgeon: Alvino Chapel, MD;  Location: [facility];  Service: Gynecology;  Laterality: Bilateral;  . LEFT HEART CATH AND CORONARY ANGIOGRAPHY [name] 04/06/2017   Procedure: LEFT HEART CATH AND CORONARY ANGIOGRAPHY;  Surgeon: Jolaine Artist, MD;  Location: [facility];  Service: Cardiovascular;  Laterality: [name];  . polyp removal  1999  . TUBAL LIGATION      Allergies  Allergies  Allergen Reactions  . Allopurinol Nausea Only  . Lipitor [Atorvastatin Calcium] Other (See Comments)    Leg aches and aching all over  . Morphine And Related Other (See Comments)    Makes the patient "loopy,"   . Simvastatin Other (See Comments)    MUSCLE PAIN  . Tape Itching and Rash    "Cannot use paper tape" --- also causes blisters. Use plastic tape    History of Present Illness    Ms. Dehaven has a PMH of essential hypertension, unstable angina, sick sinus syndrome, radial artery injury, CAD status post PCA, HLD, hypothyroidism, OA, DJD, CKD stage III, cardiomyopathy, and CVA.  Echocardiogram 12/18 showed an LVEF of 55-60%, G2 DD, PA peak pressure is 34 mmHg.  She was ruled in for NSTEMI and underwent LHC on 12/18 which showed 95% mid left circumflex which was treated with DES, 40% ostial to proximal LAD residual  and 60% proximal RCA residual.  She was intolerant of Lipitor and simvastatin and was placed on low-dose rosuvastatin.  She was noted to have a large right radial pseudoaneurysm during her 04/22/2017 visit.  It was repaired 04/26/2017.  She was noted to have mental status changes after discharge.  However they did not seek medical attention.  She underwent MRI that did show punctate acute/early subacute microvascular infarct in her right anterior caudate body, chronic right occipital infarction, and small chronic lacunar infarct.  She was admitted 6/19 after a fall and facial trauma with  nasal fracture and subdural hematoma.  Her DAPT was held at that time.  Was later resumed as an outpatient and a repeat CT showed resolution of hematoma.  Her fall was felt to be related to orthostatic hypotension and antihypertensives were reduced at that time.  She showed significant improvement.  She was last seen by Dr. Martinique on 07/11/2019.  During that time she denied chest pain, shortness of breath, and palpitations.  She would get dizzy with bending over.  She denied any further falls.  She was walking occasionally when the weather was nice.  COVID-19 positive test 11/01/2019  She contacted nurse triage line on 12/05/2019 and indicated that she had symptoms of lightheadedness and had passed out 2 times that day.  Her blood pressure was 112/50 after passing out.  EMS was consulted, she was advised to present to the emergency department.  She deferred and was scheduled for a clinic visit.  She presented to the clinic 12/06/2019 and stated she had been somewhat lightheaded since having Covid 1 month prior.  She stated that she was lifting gas cans into her trunk and passed out.  She stated that the second event happened within 5 minutes.  With the first episode she indicated that she felt lightheadedness and with the second episode her passing out was much more sudden.  Her blood pressure at the time of the event was 112/50.  She indicated that she had been drinking lots of fluids and felt she had recovered well from her COVID-19 infection.  She did notice that she did not have quite as much energy as she did prior to the infection.  I will decreased her losartan to 12.5.  She was also taking lisinopril which we stopped.  I will also ordered CBC and BMP.  We will have her follow-up in 1 month she was offered a 7-day ZIO monitor but wishes to defer at this time.  She was found to be hyperkalemic.  Dr. Harrington Challenger put her amlodipine on hold, started hydrochlorothiazide 12.5, and recommended not eating high  potassium foods.  I gave 1 packet of Lokelma for as needed use if potassium continues to be elevated.  Her repeat potassium was 5.3 and a plan was made to reevaluate during her follow-up visit.  She presents to the clinic today for follow-up evaluation and states she is slowly getting better.  She states that she still has episodes of dizziness when she gets up too fast and tries to do activities too soon.  She notes that when her dizzy spells come on her blood pressure is somewhat lower 90s over 40s.  I will decrease her carvedilol to 12.5 mg in the morning and maintain her afternoon dose at 25 mg.  We have talked about getting up slowly and pacing her physical activity.  We will repeat her BMP today.  I will have her follow-up in 3 months with Dr. Martinique.  Today  she denies chest pain, shortness of breath, lower extremity edema, fatigue, palpitations, melena, hematuria, hemoptysis, diaphoresis, weakness, presyncope, syncope, orthopnea, and PND.  Home Medications    Prior to Admission medications   Medication Sig Start Date End Date Taking? Authorizing Provider  acetaminophen (TYLENOL) 650 MG CR tablet Take 650 mg by mouth every 8 (eight) hours as needed for pain.    [provider]  aspirin EC 81 MG tablet Take 1 tablet (81 mg total) by mouth daily. 04/06/18   Erlene Quan, PA-C  carvedilol (COREG) 25 MG tablet Take 1 tablet (25 mg total) by mouth 2 (two) times daily with a meal. 07/25/19   Martinique, Peter M, MD  Cholecalciferol (VITAMIN D PO) Take by mouth.    [provider]  Colchicine (MITIGARE) 0.6 MG CAPS Take 1 capsule by mouth 2 (two) times daily. 06/06/19   Tower, Wynelle Fanny, MD  DULoxetine (CYMBALTA) 30 MG capsule Take 1 capsule (30 mg total) by mouth daily. 12/18/18   Tower, Wynelle Fanny, MD  ferrous sulfate 325 (65 FE) MG tablet Take 1 tablet (325 mg total) by mouth daily with breakfast. 12/18/18   Tower, Wynelle Fanny, MD  hydrochlorothiazide (HYDRODIURIL) 12.5 MG tablet Take 1 tablet  (12.5 mg total) by mouth daily. 12/18/19   Deberah Pelton, NP  loperamide (IMODIUM) 2 MG capsule Take 1 capsule (2 mg total) by mouth as needed for diarrhea or loose stools. 04/29/16   Eugenie Filler, MD  losartan (COZAAR) 25 MG tablet Take 0.5 tablets (12.5 mg total) by mouth daily. 12/06/19   Deberah Pelton, NP  nitroGLYCERIN (NITROSTAT) 0.4 MG SL tablet Place 1 tablet (0.4 mg total) under the tongue every 5 (five) minutes x 3 doses as needed for chest pain. 04/09/17   [name], [name], PA-C  rosuvastatin (CRESTOR) 10 MG tablet TAKE ONE TABLET BY MOUTH DAILY. PLEASE KEEP UPCOMING APPOINTMENT FOR REFILLS 11/01/19   Martinique, Peter M, MD  vitamin B-12 (CYANOCOBALAMIN) 1000 MCG tablet Take 1,000 mcg by mouth daily.    [provider]    Family History    Family History  Problem Relation Age of Onset  . Heart disease Father   . Heart attack Father   . Leukemia Brother   . Lung cancer Brother   . Leukemia Brother   . Stomach cancer Brother   . Heart disease Sister        CABG  . Stroke Sister   . Cancer Sister   . Bladder Cancer Sister   . Heart disease Brother        CABG  . Stroke Sister        "massive brain bleed"   She indicated that her mother is deceased. She indicated that her father is deceased. She indicated that both of her sisters are deceased. She indicated that three of her four brothers are deceased.  Social History    Social History   Socioeconomic History  . Marital status: Widowed    Spouse name: Not on file  . Number of children: 2  . Years of education: Not on file  . Highest education level: High school graduate  Occupational History  . Not on file  Tobacco Use  . Smoking status: Never Smoker  . Smokeless tobacco: Never Used  Vaping Use  . Vaping Use: Never used  Substance and Sexual Activity  . Alcohol use: Never    Alcohol/week: 0.0 standard drinks  . Drug use: Never  . Sexual  activity: Never    Birth control/protection:  Post-menopausal  Other Topics Concern  . Not on file  Social History Narrative  . Not on file   Social Determinants of Health   Financial Resource Strain:   . Difficulty of Paying Living Expenses: Not on file  Food Insecurity:   . Worried About Charity fundraiser in the Last Year: Not on file  . Ran Out of Food in the Last Year: Not on file  Transportation Needs:   . Lack of Transportation (Medical): Not on file  . Lack of Transportation (Non-Medical): Not on file  Physical Activity:   . Days of Exercise per Week: Not on file  . Minutes of Exercise per Session: Not on file  Stress:   . Feeling of Stress : Not on file  Social Connections:   . Frequency of Communication with Friends and Family: Not on file  . Frequency of Social Gatherings with Friends and Family: Not on file  . Attends Religious Services: Not on file  . Active Member of Clubs or Organizations: Not on file  . Attends Archivist Meetings: Not on file  . Marital Status: Not on file  Intimate Partner Violence:   . Fear of Current or Ex-Partner: Not on file  . Emotionally Abused: Not on file  . Physically Abused: Not on file  . Sexually Abused: Not on file     Review of Systems    General:  No chills, fever, night sweats or weight changes.  Cardiovascular:  No chest pain, dyspnea on exertion, edema, orthopnea, palpitations, paroxysmal nocturnal dyspnea. Dermatological: No rash, lesions/masses Respiratory: No cough, dyspnea Urologic: No hematuria, dysuria Abdominal:   No nausea, vomiting, diarrhea, bright red blood per rectum, melena, or hematemesis Neurologic:  No visual changes, wkns, changes in mental status. All other systems reviewed and are otherwise negative except as noted above.  Physical Exam    VS:  BP 116/74 (BP Location: Left Arm, Patient Position: Sitting, Cuff Size: Large)   Pulse 72   Temp 98.1 F (36.7 C) (Tympanic)   Resp 14   Ht 5\' 2"  (1.575 m)   Wt 207 lb 6.4 oz (94.1 kg)    SpO2 97%   BMI 37.93 kg/m  , BMI Body mass index is 37.93 kg/m. GEN: Well nourished, well developed, in no acute distress. HEENT: normal. Neck: Supple, no JVD, carotid bruits, or masses. Cardiac: RRR, no murmurs, rubs, or gallops. No clubbing, cyanosis, edema.  Radials/DP/PT 2+ and equal bilaterally.  Respiratory:  Respirations regular and unlabored, clear to auscultation bilaterally. GI: Soft, nontender, nondistended, BS + x 4. MS: no deformity or atrophy. Skin: warm and dry, no rash. Neuro:  Strength and sensation are intact. Psych: Normal affect.  Accessory Clinical Findings    Recent Labs: 12/06/2019: Hemoglobin 12.2; Platelets 205 12/18/2019: BUN 30; Creatinine, Ser 1.45; Potassium 5.3; Sodium 139   Recent Lipid Panel    Component Value Date/Time   CHOL 132 12/11/2018 1001   CHOL 123 05/18/2017 1154   TRIG 136.0 12/11/2018 1001   HDL 48.40 12/11/2018 1001   HDL 52 05/18/2017 1154   CHOLHDL 3 12/11/2018 1001   VLDL 27.2 12/11/2018 1001   LDLCALC 56 12/11/2018 1001   LDLCALC 28 05/18/2017 1154   LDLDIRECT 136.0 10/31/2015 0959    ECG personally reviewed by me today-none today.  Echocardiogram 04/05/2017 Study Conclusions   - Left ventricle: The cavity size was normal. Wall thickness was  increased in a pattern of  severe LVH. Systolic function was  normal. The estimated ejection fraction was in the range of 55%  to 60%. Wall motion was normal; there were no regional wall  motion abnormalities. Doppler parameters are consistent with  pseudonormal left ventricular relaxation (grade 2 diastolic  dysfunction). The E/e&' ratio is >15, suggesting elevated LV  filling pressure.  - Aortic valve: Sclerosis without stenosis. Transvalvular velocity  was minimally increased. Mean gradient (S): 7 mm Hg.  - Mitral valve: Mildly thickened leaflets . There was trivial  regurgitation.  - Left atrium: Moderately dilated.  - Tricuspid valve: There was mild  regurgitation.  - Pulmonary arteries: PA peak pressure: 34 mm Hg (S).  - Inferior vena cava: The vessel was normal in size. The  respirophasic diameter changes were in the normal range (>= 50%),  consistent with normal central venous pressure.  Assessment & Plan   1.  Hyperkalemia-potassium 5.8 on 12/17/2019.  Labs repeated on 12/18/2019 and potassium was 5.3.  Patient given Milly Jakob as a precaution but did not have to use.  Amlodipine stopped hydrochlorothiazide started. Repeat BMP  Syncope/loss of consciousness-heart rate today  72.  Blood pressure today 116/74.  Had 2 previous episodes of syncope with loss of consciousness 12/05/2019.  COVID-19 positive on 11/01/2019.  EMS consulted and no head trauma reported.  No further episodes.  Increase p.o. hydration Lower extremity support stockings discussed Move slowly when changing positions.  Essential hypertension-blood pressure today  116/74.    90s over 40s at home after syncopal event Continue HCTZ Decrease losartan to 12.5 Continue carvedilol Increase p.o. fluids  Hyperlipidemia-LDL 56 on 8/20 Continue rosuvastatin Heart healthy low-sodium high-fiber diet Maintain physical activity Repeat lipid panel  Hypothyroidism-TSH 2.31 on 12/11/2018 Followed by PCP  Disposition: Follow-up with Dr. Martinique or me in 3 months.   Jossie Ng. Cleaver NP-C    01/11/2020, 3:04 PM [facility] [address] Office [phone] Fax [phone]  Notice: This dictation was prepared with Dragon dictation along with smaller phrase technology. Any transcriptional errors that result from this process are unintentional and may not be corrected upon review.

## 2020-01-11 ENCOUNTER — Other Ambulatory Visit: Payer: Self-pay

## 2020-01-11 ENCOUNTER — Ambulatory Visit (INDEPENDENT_AMBULATORY_CARE_PROVIDER_SITE_OTHER): Payer: Medicare Other | Admitting: General Practice

## 2020-01-11 ENCOUNTER — Encounter: Payer: Self-pay | Admitting: General Practice

## 2020-01-11 VITALS — BP 116/74 | HR 72 | Temp 98.1°F | Resp 14 | Ht 62.0 in | Wt 207.4 lb

## 2020-01-11 DIAGNOSIS — E039 Hypothyroidism, unspecified: Secondary | ICD-10-CM

## 2020-01-11 DIAGNOSIS — E782 Mixed hyperlipidemia: Secondary | ICD-10-CM

## 2020-01-11 DIAGNOSIS — I10 Essential (primary) hypertension: Secondary | ICD-10-CM

## 2020-01-11 DIAGNOSIS — Z79899 Other long term (current) drug therapy: Secondary | ICD-10-CM | POA: Diagnosis not present

## 2020-01-11 DIAGNOSIS — R55 Syncope and collapse: Secondary | ICD-10-CM | POA: Diagnosis not present

## 2020-01-11 DIAGNOSIS — E875 Hyperkalemia: Secondary | ICD-10-CM

## 2020-01-11 MED ORDER — CARVEDILOL 25 MG PO TABS: ORAL_TABLET | ORAL | 12 refills | Status: DC

## 2020-01-11 NOTE — Patient Instructions (Signed)
Medication Instructions:  CARVEDILOL 12.5MG (1/2 TAB) IN THE AM; 25MG  (WHOLE TAB) IN THE PM *If you need a refill on your cardiac medications before your next appointment, please call your pharmacy*  Lab Work: BMET TODAY If you have labs (blood work) drawn today and your tests are completely normal, you will receive your results only by:  [facility] (if you have MyChart) OR A paper copy in the mail.  If you have any lab test that is abnormal or we need to change your treatment, we will call you to review the results. You may go to any [facility] that is convenient for you however, we do have a lab in our office that is able to assist you. You DO NOT need an appointment for our lab. The lab is open 8:00am and closes at 4:00pm. Lunch 12:45 - 1:45pm.  Testing/Procedures: NONE  Special Instructions SLOW DOWN, PACE YOUR PHYSICAL ACTIVITY  Follow-Up: Your next appointment:  3 month(s) In Person with Peter Martinique, MD -[place], FNP-C   At [facility], you and your health needs are our priority.  As part of our continuing mission to provide you with exceptional heart care, we have created designated Provider Care Teams.  These Care Teams include your primary Cardiologist (physician) and Advanced Practice Providers (APPs -  Physician Assistants and Nurse Practitioners) who all work together to provide you with the care you need, when you need it.  We recommend signing up for the patient portal called "MyChart".  Sign up information is provided on this After Visit Summary.  MyChart is used to connect with patients for Virtual Visits (Telemedicine).  Patients are able to view lab/test results, encounter notes, upcoming appointments, etc.  Non-urgent messages can be sent to your provider as well.   To learn more about what you can do with MyChart, go to [email or web address].

## 2020-01-12 LAB — BASIC METABOLIC PANEL
BUN/Creatinine Ratio: 25 (ref 12–28)
BUN: 43 mg/dL — ABNORMAL HIGH (ref 8–27)
CO2: 25 mmol/L (ref 20–29)
Calcium: 9.7 mg/dL (ref 8.7–10.3)
Chloride: 101 mmol/L (ref 96–106)
Creatinine, Ser: 1.69 mg/dL — ABNORMAL HIGH (ref 0.57–1.00)
GFR calc Af Amer: 31 mL/min/{1.73_m2} — ABNORMAL LOW (ref >59)
GFR calc non Af Amer: 27 mL/min/{1.73_m2} — ABNORMAL LOW (ref >59)
Glucose: 159 mg/dL — ABNORMAL HIGH (ref 65–99)
Potassium: 5.1 mmol/L (ref 3.5–5.2)
Sodium: 140 mmol/L (ref 134–144)

## 2020-01-29 ENCOUNTER — Other Ambulatory Visit: Payer: Self-pay | Admitting: Family Medicine

## 2020-01-29 NOTE — Telephone Encounter (Signed)
Last OV and refill--12/18/2018. Please advise

## 2020-01-29 NOTE — Telephone Encounter (Signed)
Please schedule a f/u and refill until then 

## 2020-01-30 NOTE — Telephone Encounter (Signed)
Med refilled once and Carrie will reach out to pt to try and get appt scheduled  

## 2020-01-30 NOTE — Telephone Encounter (Signed)
I left a detailed message on cell phone voice mail to call back and schedule f/u with Dr.Tower.

## 2020-02-03 DIAGNOSIS — Z23 Encounter for immunization: Secondary | ICD-10-CM | POA: Diagnosis not present

## 2020-02-12 ENCOUNTER — Telehealth: Payer: Self-pay

## 2020-02-12 NOTE — Telephone Encounter (Signed)
Aware Will see her then

## 2020-02-12 NOTE — Telephone Encounter (Signed)
[facility] - Client TELEPHONE ADVICE RECORD [facility] Patient Name: Valerie Ramirez Gender: Female DOB: [birth year] Age: 86 Y 70 M 5 D Return Phone Number: [phone] (Primary) Address: City/State/Zip: [place] [address] Client [facility] - Client Client Site Shepherd MD Contact Type Call Who Is Calling Patient / Member / Family / Caregiver Call Type Triage / Clinical Relationship To Patient Self Return Phone Number [phone] (Primary) Chief Complaint Dizziness Reason for Call Symptomatic / Request for Health Information Initial Comment Caller states, pt is dizzy at times. Translation No Nurse Assessment Nurse: Valerie Evert, Valerie Ramirez, Valerie Ramirez Date/Time (Eastern Time): 02/12/2020 9:53:47 AM Confirm and document reason for call. If symptomatic, describe symptoms. ---Caller states she is having dizziness at times. No other symptoms Does the patient have any new or worsening symptoms? ---Yes Will a triage be completed? ---Yes Related visit to physician within the last 2 weeks? ---No Does the PT have any chronic conditions? (i.e. diabetes, asthma, this includes High risk factors for pregnancy, etc.) ---No Is this a behavioral health or substance abuse call? ---No Guidelines Guideline Title Affirmed Question Affirmed Notes Nurse Date/Time (Eastern Time) Dizziness - Lightheadedness [1] MODERATE dizziness (e.g., interferes with normal activities) AND [2] has NOT been evaluated by physician for this (Exception: dizziness caused by heat exposure, sudden standing, or poor fluid intake) Valerie Evert, Valerie Ramirez, Valerie Ramirez 02/12/2020 9:56:58 AM Disp. Time Eilene Ghazi Time) Disposition Final User 02/12/2020 10:02:35 AM See PCP within 24 Hours Yes Valerie Evert, Valerie Ramirez, [name] Disagree/Comply Comply PLEASE NOTE: All timestamps contained within this report are represented as [place] Standard  Time. CONFIDENTIALTY NOTICE: This fax transmission is intended only for the addressee. It contains information that is legally privileged, confidential or otherwise protected from use or disclosure. If you are not the intended recipient, you are strictly prohibited from reviewing, disclosing, copying using or disseminating any of this information or taking any action in reliance on or regarding this information. If you have received this fax in error, please notify us immediately by telephone so that we can arrange for its return to [place]. Phone: [phone], Toll-Free: [phone], Fax: [phone] Page: 2 of 2 Call Id: [id number] [place] Understands Yes PreDisposition Did not know what to do Care Advice Given Per Guideline SEE PCP WITHIN 24 HOURS: * IF OFFICE WILL BE OPEN: You need to be examined within the next 24 hours. Call your doctor (or NP/PA) when the office opens and make an appointment. DRINK FLUIDS: * Drink several glasses of fruit juice, other clear fluids or water. * This will improve hydration and blood glucose. LIE DOWN AND REST: * Lie down with feet elevated for 1 hour. * This will improve circulation and increase blood flow to the brain. CALL BACK IF: * Passes out (faints) * You become worse CARE ADVICE given per Dizziness (Adult) guideline. Referrals REFERRED TO PCP OFFICE

## 2020-02-12 NOTE — Telephone Encounter (Addendum)
I spoke with pt; pt has on and off dizziness or lightheadedness (pt is not sure if room spins around or not) and pt cannot say how long she has had the dizziness or lightheadedness. Pt only wants appt with Dr Glori Bickers; No H/A, CP or SOB. Pt does not have dizziness or lightheadedness now.Pt offered appts with other providers prior to 02/14/20 and pt declined. UC & ED precautions given and pt voiced understanding but prefers to be seen by Dr Glori Bickers. Pt scheduled in office 30' appt on 02/14/20 at 11:30 AM. Pt has no covid symptoms and no known exposure to + covid. Pt has had covid vaccines. Sending note to DR [employer].

## 2020-02-14 ENCOUNTER — Other Ambulatory Visit: Payer: Self-pay

## 2020-02-14 ENCOUNTER — Ambulatory Visit (INDEPENDENT_AMBULATORY_CARE_PROVIDER_SITE_OTHER): Payer: Medicare Other | Admitting: Family Medicine

## 2020-02-14 ENCOUNTER — Encounter: Payer: Self-pay | Admitting: Family Medicine

## 2020-02-14 VITALS — BP 122/68 | HR 68 | Temp 97.3°F | Ht 62.0 in | Wt 210.3 lb

## 2020-02-14 DIAGNOSIS — R42 Dizziness and giddiness: Secondary | ICD-10-CM | POA: Diagnosis not present

## 2020-02-14 DIAGNOSIS — I10 Essential (primary) hypertension: Secondary | ICD-10-CM | POA: Diagnosis not present

## 2020-02-14 DIAGNOSIS — R519 Headache, unspecified: Secondary | ICD-10-CM | POA: Diagnosis not present

## 2020-02-14 DIAGNOSIS — I2510 Atherosclerotic heart disease of native coronary artery without angina pectoris: Secondary | ICD-10-CM

## 2020-02-14 DIAGNOSIS — Z8673 Personal history of transient ischemic attack (TIA), and cerebral infarction without residual deficits: Secondary | ICD-10-CM | POA: Insufficient documentation

## 2020-02-14 MED ORDER — FLUTICASONE PROPIONATE 50 MCG/ACT NA SUSP
2.0000 | Freq: Every day | NASAL | 6 refills | Status: DC
Start: 2020-02-14 — End: 2021-06-24

## 2020-02-14 MED ORDER — DULOXETINE HCL 30 MG PO CPEP: 30.0000 mg | ORAL_CAPSULE | Freq: Every day | ORAL | 3 refills | Status: DC

## 2020-02-14 NOTE — Patient Instructions (Signed)
I sent flonase to your drugstore to see if it helps dizziness   I want to order an MRI of head if insurance will cover it  Let me get started on that and we will give you a call   Keep drinking fluids and take care of yourself  Don't overdo it -if you get tried please rest/take a break   If blood pressure gets low again let me know  It is good today   Please update me if symptoms worsen

## 2020-02-14 NOTE — Assessment & Plan Note (Signed)
This is acute on chronic with hx of past CVA as well as subdural in the past  Few beats of horizontal nystagmus on exam today  Otherwise baseline poor balance  She also c/o occ headache and ongoing neck pain In light of vascular hx I would like to check MRI of brain if we can get it covered Disc imp of good hydration and fall precautions (family aware)  Px flonase for help with eustachian tubes in case this could add

## 2020-02-14 NOTE — Assessment & Plan Note (Signed)
These have been seen in R ant caudate, R occipital area and R lacunar on last MRI  In light of return of dizziness and some headaches would like to check MRI

## 2020-02-14 NOTE — Assessment & Plan Note (Signed)
Recently cardiology lowered doses of coreg and losartan for hypotension  Nl bp today  No orthostatic changes This did help dizziness a bit

## 2020-02-14 NOTE — Progress Notes (Signed)
Subjective:    Patient ID: Valerie Ramirez, female    DOB: 1932/09/08, 86 y.o.   MRN: [id number]  This visit occurred during the SARS-CoV-2 public health emergency.  Safety protocols were in place, including screening questions prior to the visit, additional usage of staff PPE, and extensive cleaning of exam room while observing appropriate contact time as indicated for disinfecting solutions.    HPI Pt presents for c/o dizziness  Wt Readings from Last 3 Encounters:  02/14/20 210 lb 5 oz (95.4 kg)  01/11/20 207 lb 6.4 oz (94.1 kg)  12/06/19 208 lb (94.3 kg)   38.47 kg/m  Her dizziness was bad on Sunday (was making molasses)  Feeling of a rush of hot air going up her body to the top of her head  She then grabs her head  Sensation then traveled to neck and shoulders  Then pain in head   No falls  occ has to sit down because she is dizzy or grab someone   A little stronger than usual   R ear bothers her- feels numb and funny pressure  L ear -some pounding a few weeks ago   No nasal congestion   Today feels better  Neck hurts on L side only  Yesterday it went to temple -but not today   Worse with standing up/turning too quick  Also if tired   No vision changes  occ feels "like a curtain comes down" over eyes but does not affect vision    Went back to the house and took 2 aspirins    Remote hx of fall/subdural hematoma in 6/19    HTN bp is stable today  No cp or palpitations or headaches or edema  No side effects to medicines  BP Readings from Last 3 Encounters:  02/14/20 122/68  01/11/20 116/74  12/06/19 (!) 114/54    Takes coreg 12.5 mg bid  Losartan 12.5 mg daily  She has had some orthostatic hypotension and cut doses (helped some)  She drinks a lot of water  Pulse Readings from Last 3 Encounters:  02/14/20 68  01/11/20 72  12/06/19 99   Lab Results  Component Value Date   HGBA1C 6.1 12/11/2018   Lab Results  Component Value Date   TSH 2.31  12/11/2018   Last mri and Ct in 2019   MRI IMPRESSION: 1. Punctate acute/early subacute microvascular infarct in right anterior caudate body. No acute hemorrhage. 2. Chronic right occipital infarction. 3. Moderate to severe chronic microvascular ischemic changes and moderate brain parenchymal volume loss. 4. Small chronic lacunar infarct in right putamen extending into mid corona radiata.   Patient Active Problem List   Diagnosis Date Noted  . History of CVA (cerebrovascular accident) 02/14/2020  . Headache 02/14/2020  . Diarrhea 12/26/2017  . Dizziness 12/26/2017  . Depression 10/26/2017  . H/O subdural hemorrhage 09/18/2017  . CAD S/P percutaneous coronary angioplasty 09/18/2017  . Closed fracture of nasal bones   . Epistaxis   . Elevated transaminase level 08/03/2017  . Radial artery injury, left, sequela 06/15/2017  . H/O: CVA (cerebrovascular accident) 05/26/2017  . Sick sinus syndrome ([place]) 05/25/2017  . Exertional chest pain 04/05/2017  . Chronic renal disease, stage III ([place]) 04/05/2017  . Anemia 04/05/2017  . Thoracic back pain 09/15/2016  . History of radius fracture 06/01/2016  . Status post cervical spinal fusion 08/18/2015  . Osteoarthritis, hip, bilateral 08/12/2014  . Hearing loss in right ear 06/12/2014  . Colon cancer screening  08/10/2013  . Seborrheic keratoses, inflamed 08/10/2013  . History of fall 04/10/2013  . Encounter for Medicare annual wellness exam 08/08/2012  . Gout 07/14/2012  . History of endometrial cancer 05/25/2012  . Degenerative joint disease of cervical spine 05/03/2011  . Other screening mammogram 03/31/2011  . Prediabetes 09/26/2007  . Obesity (BMI 30-39.9) 07/12/2007  . Essential hypertension 07/11/2007  . History of cardiomyopathy 07/11/2007  . Osteoarthritis 07/11/2007  . 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 Hypothyroidism 10/05/2006  . HYPERCHOLESTEROLEMIA, PURE 10/05/2006   Past Medical History:  Diagnosis  Date  . Arthritis    OA  . CAD (coronary artery disease)    stent December 2018  . Cancer ([place])    endometrial  . GERD (gastroesophageal reflux disease)   . Hearing loss   . History of kidney stones   . Hyperlipidemia   . Hypertension   . Hypothyroid    "not in years"  . Shortness of breath dyspnea    04/25/17- not anymore   Past Surgical History:  Procedure Laterality Date  . ABDOMINAL HYSTERECTOMY    . ANTERIOR CERVICAL DECOMP/DISCECTOMY FUSION [name] 08/18/2015   Procedure: C5-6, C6-7 Anterior Cervical Discectomy and Fusion, Allograft, Plate;  Surgeon: Marybelle Killings, MD;  Location: [facility];  Service: Orthopedics;  Laterality: [name];  . bone spur removed Right    shoulder  . BOWEL RESECTION  07/04/2012   Procedure: SMALL BOWEL RESECTION;  Surgeon: Alvino Chapel, MD;  Location: [facility];  Service: Gynecology;;  . BREAST BIOPSY    . CHOLECYSTECTOMY    . CORONARY STENT INTERVENTION [name] 04/06/2017   Procedure: CORONARY STENT INTERVENTION;  Surgeon: Wellington Hampshire, MD;  Location: [facility];  Service: Cardiovascular;  Laterality: [name];  . DILATION AND CURETTAGE OF UTERUS  1999  . EYE SURGERY Bilateral    cataracts  . FALSE ANEURYSM REPAIR Right 04/26/2017   Procedure: 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;  Surgeon: Conrad Drowning Creek, MD;  Location: [facility];  Service: Vascular;  Laterality: Right;  . HAND SURGERY  12/2008   after hand fx/due to fall  . JOINT REPLACEMENT Bilateral 07/1998   knees  . KNEE ARTHROPLASTY  05/23/1998   total right  . LAPAROTOMY Bilateral 07/04/2012   Procedure: EXPLORATORY LAPAROTOMY TOTAL ABDOMINAL HYSTERECTOMY BILATERAL SALPINGO-OOPHORECTOMY with small bowel resection ;  Surgeon: Alvino Chapel, MD;  Location: [facility];  Service: Gynecology;  Laterality: Bilateral;  . LEFT HEART CATH AND CORONARY ANGIOGRAPHY [name] 04/06/2017   Procedure: LEFT HEART CATH AND CORONARY ANGIOGRAPHY;  Surgeon: Jolaine Artist, MD;  Location: [facility];  Service: Cardiovascular;  Laterality: [name];  . polyp removal  1999  . TUBAL LIGATION     Social History   Tobacco Use  . Smoking status: Never Smoker  . Smokeless tobacco: Never Used  Vaping Use  . Vaping Use: Never used  Substance Use Topics  . Alcohol use: Never    Alcohol/week: 0.0 standard drinks  . Drug use: Never   Family History  Problem Relation Age of Onset  . Heart disease Father   . Heart attack Father   . Leukemia Brother   . Lung cancer Brother   . Leukemia Brother   . Stomach cancer Brother   . Heart disease Sister        CABG  . Stroke Sister   . Cancer Sister   . Bladder Cancer Sister   . Heart disease Brother  CABG  . Stroke Sister        "massive brain bleed"   Allergies  Allergen Reactions  . Allopurinol Nausea Only  . Lipitor [Atorvastatin Calcium] Other (See Comments)    Leg aches and aching all over  . Morphine And Related Other (See Comments)    Makes the patient "loopy,"   . Simvastatin Other (See Comments)    MUSCLE PAIN  . Tape Itching and Rash    "Cannot use paper tape" --- also causes blisters. Use plastic tape   Current Outpatient Medications on File Prior to Visit  Medication Sig Dispense Refill  . acetaminophen (TYLENOL) 650 MG CR tablet Take 650 mg by mouth every 8 (eight) hours as needed for pain.    [name] aspirin EC 81 MG tablet Take 1 tablet (81 mg total) by mouth daily. 30 tablet 3  . carvedilol (COREG) 25 MG tablet Take 0.5 tablets (12.5 mg total) by mouth in the morning AND 1 tablet (25 mg total) every evening. 45 tablet 12  . Cholecalciferol (VITAMIN D PO) Take by mouth.    . Colchicine (MITIGARE) 0.6 MG CAPS Take 1 capsule by mouth 2 (two) times daily. 60 capsule 5  . losartan (COZAAR) 25 MG tablet Take 0.5 tablets (12.5 mg total) by mouth daily. 90 tablet 2  . nitroGLYCERIN (NITROSTAT) 0.4 MG SL tablet Place 1 tablet (0.4 mg total) under the tongue every 5 (five) minutes x 3 doses as needed for chest pain. 25  tablet 12   No current facility-administered medications on file prior to visit.    Review of Systems  Constitutional: Negative for activity change, appetite change, fatigue, fever and unexpected weight change.  HENT: Negative for congestion, ear pain, rhinorrhea, sinus pressure and sore throat.   Eyes: Negative for pain, redness and visual disturbance.  Respiratory: Negative for cough, shortness of breath and wheezing.   Cardiovascular: Negative for chest pain and palpitations.  Gastrointestinal: Negative for abdominal pain, blood in stool, constipation and diarrhea.  Endocrine: Negative for polydipsia and polyuria.  Genitourinary: Negative for dysuria, frequency and urgency.  Musculoskeletal: Negative for arthralgias, back pain and myalgias.  Skin: Negative for pallor and rash.  Allergic/Immunologic: Negative for environmental allergies.  Neurological: Positive for dizziness and light-headedness. Negative for tremors, seizures, syncope, facial asymmetry, speech difficulty, weakness, numbness and headaches.       Poor balance baseline  Hematological: Negative for adenopathy. Does not bruise/bleed easily.  Psychiatric/Behavioral: Negative for confusion, decreased concentration and dysphoric mood. The patient is not nervous/anxious.        Objective:   Physical Exam Constitutional:      General: She is not in acute distress.    Appearance: Normal appearance. She is well-developed. She is obese. She is not ill-appearing or diaphoretic.  HENT:     Head: Normocephalic and atraumatic.     Comments: No sinus or temporal tenderness    Right Ear: Tympanic membrane, ear canal and external ear normal.     Left Ear: Tympanic membrane, ear canal and external ear normal.     Ears:     Comments: Partial cerumen bilat    Nose: Nose normal.     Mouth/Throat:     Pharynx: No oropharyngeal exudate.  Eyes:     General: No scleral icterus.       Right eye: No discharge.        Left eye: No  discharge.     Conjunctiva/sclera: Conjunctivae normal.     Pupils: Pupils  are equal, round, and reactive to light.     Comments: 2-3 beats of horizontal nystagmus bilat  Neck:     Thyroid: No thyromegaly.     Vascular: No carotid bruit or JVD.     Trachea: No tracheal deviation.  Cardiovascular:     Rate and Rhythm: Normal rate and regular rhythm.     Pulses: Normal pulses.     Heart sounds: Normal heart sounds. No murmur heard.   Pulmonary:     Effort: Pulmonary effort is normal. No respiratory distress.     Breath sounds: Normal breath sounds. No wheezing or rales.  Abdominal:     General: Bowel sounds are normal. There is no distension.     Palpations: Abdomen is soft. There is no mass.     Tenderness: There is no abdominal tenderness.  Musculoskeletal:        General: No tenderness.     Cervical back: Full passive range of motion without pain, normal range of motion and neck supple. No tenderness.     Right lower leg: No edema.     Left lower leg: No edema.  Lymphadenopathy:     Cervical: No cervical adenopathy.  Skin:    General: Skin is warm and dry.     Coloration: Skin is not pale.     Findings: No rash.     Comments: Solar lentigines diffusely Some sks  Neurological:     Mental Status: She is alert and oriented to person, place, and time.     Cranial Nerves: No cranial nerve deficit, dysarthria or facial asymmetry.     Sensory: Sensation is intact. No sensory deficit.     Motor: No weakness, tremor, atrophy, abnormal muscle tone or pronator drift.     Coordination: Coordination is intact. Romberg sign negative. Coordination normal.     Gait: Gait is intact. Gait normal.     Deep Tendon Reflexes: Reflexes are normal and symmetric. Reflexes normal.     Comments: No focal cerebellar signs   Psychiatric:        Behavior: Behavior normal.        Thought Content: Thought content normal.           Assessment & Plan:   Problem List Items Addressed This Visit       Cardiovascular and Mediastinum   Essential hypertension    Recently cardiology lowered doses of coreg and losartan for hypotension  Nl bp today  No orthostatic changes This did help dizziness a bit        Other   H/O: CVA (cerebrovascular accident) (Chronic)   Dizziness - Primary    This is acute on chronic with hx of past CVA as well as subdural in the past  Few beats of horizontal nystagmus on exam today  Otherwise baseline poor balance  She also c/o occ headache and ongoing neck pain In light of vascular hx I would like to check MRI of brain if we can get it covered Disc imp of good hydration and fall precautions (family aware)  Px flonase for help with eustachian tubes in case this could add       History of CVA (cerebrovascular accident)    These have been seen in R ant caudate, R occipital area and R lacunar on last MRI  In light of return of dizziness and some headaches would like to check MRI        Headache   Relevant Medications   DULoxetine (CYMBALTA)  30 MG capsule   Other Relevant Orders   MR Brain Wo Contrast

## 2020-03-25 ENCOUNTER — Encounter: Payer: Self-pay | Admitting: Family Medicine

## 2020-03-25 ENCOUNTER — Other Ambulatory Visit: Payer: Self-pay | Admitting: Family Medicine

## 2020-03-25 ENCOUNTER — Ambulatory Visit
Admission: RE | Admit: 2020-03-25 | Discharge: 2020-03-25 | Disposition: A | Discharge status: Home / Self Care | Payer: Medicare Other | Source: Ambulatory Visit | Attending: Family Medicine | Admitting: Family Medicine

## 2020-03-25 ENCOUNTER — Ambulatory Visit (INDEPENDENT_AMBULATORY_CARE_PROVIDER_SITE_OTHER): Payer: Medicare Other | Admitting: Family Medicine

## 2020-03-25 ENCOUNTER — Telehealth: Payer: Self-pay | Admitting: Neurology

## 2020-03-25 ENCOUNTER — Other Ambulatory Visit: Payer: Self-pay

## 2020-03-25 VITALS — BP 121/68 | HR 64 | Temp 96.4°F | Ht 62.0 in | Wt 209.3 lb

## 2020-03-25 DIAGNOSIS — N1832 Chronic kidney disease, stage 3b: Secondary | ICD-10-CM | POA: Diagnosis not present

## 2020-03-25 DIAGNOSIS — R519 Headache, unspecified: Secondary | ICD-10-CM

## 2020-03-25 DIAGNOSIS — I2510 Atherosclerotic heart disease of native coronary artery without angina pectoris: Secondary | ICD-10-CM | POA: Diagnosis not present

## 2020-03-25 DIAGNOSIS — R300 Dysuria: Secondary | ICD-10-CM

## 2020-03-25 DIAGNOSIS — N390 Urinary tract infection, site not specified: Secondary | ICD-10-CM | POA: Insufficient documentation

## 2020-03-25 DIAGNOSIS — I10 Essential (primary) hypertension: Secondary | ICD-10-CM | POA: Diagnosis not present

## 2020-03-25 DIAGNOSIS — N3001 Acute cystitis with hematuria: Secondary | ICD-10-CM

## 2020-03-25 DIAGNOSIS — R42 Dizziness and giddiness: Secondary | ICD-10-CM

## 2020-03-25 LAB — POC URINALSYSI DIPSTICK (AUTOMATED)
Bilirubin, UA: NEGATIVE
Blood, UA: 200
Glucose, UA: NEGATIVE
Ketones, UA: NEGATIVE
Nitrite, UA: NEGATIVE
Protein, UA: POSITIVE — AB
Spec Grav, UA: 1.02 (ref 1.010–1.025)
Urobilinogen, UA: 0.2 E.U./dL
pH, UA: 5.5 (ref 5.0–8.0)

## 2020-03-25 MED ORDER — PREDNISONE 10 MG PO TABS
ORAL_TABLET | ORAL | 0 refills | Status: DC
Start: 2020-03-25 — End: 2020-05-01

## 2020-03-25 MED ORDER — CEPHALEXIN 500 MG PO CAPS: 500.0000 mg | ORAL_CAPSULE | Freq: Three times a day (TID) | ORAL | 0 refills | Status: DC

## 2020-03-25 NOTE — Assessment & Plan Note (Addendum)
Pt saw blood  Had R sided flank pain - felt like she passed a stone /now better but some urgency ua pos for blood and protein and leuk tx with keflex 500 mg (tid since also sinusitis) and cx pending Enc fluids  She will let us know if she passes a stone but suspect she already did

## 2020-03-25 NOTE — Patient Instructions (Signed)
Keep drinking fluids   I will message cardiology about your blood pressure  Let's see if headache improves with prednisone and keflex for sinus infection   The keflex is also for uti   Watch out for a kidney stone    Update me if symptoms worsen at all

## 2020-03-25 NOTE — Telephone Encounter (Signed)
Reviewed message with Dr Jaynee Eagles. Also see that pt's MRI brain from this morning has resulted. The pt will need to follow-up with Dr Glori Bickers for completion of evaluation. She has an appointment scheduled today at 12:30 pm. PCP can refer to neurology if needed but we may not be able to get her in very quickly. If this is progressing and more urgent the patient needs to go to the emergency room. I spoke with the patient's daughter [name] (on [place]) and discussed this. Per hipaa I did not discuss anything about the MRI being resulted yet.

## 2020-03-25 NOTE — Telephone Encounter (Signed)
Please let pt/family know that I touched base with Dr [place] and want to decrease her carvedilol to 6.35 mg bid  I pended px to send to pharmacy of choice Please let me know if dizziness does not improve over the week or if any other problems

## 2020-03-25 NOTE — Assessment & Plan Note (Signed)
Suspect uti and recent stone Enc inc water intake

## 2020-03-25 NOTE — Progress Notes (Signed)
Subjective:    Patient ID: Valerie Ramirez, female    DOB: 06-22-1932, 86 y.o.   MRN: [id number]  This visit occurred during the SARS-CoV-2 public health emergency.  Safety protocols were in place, including screening questions prior to the visit, additional usage of staff PPE, and extensive cleaning of exam room while observing appropriate contact time as indicated for disinfecting solutions.    HPI Pt presents for urinary symptoms   Wt Readings from Last 3 Encounters:  03/25/20 209 lb 5 oz (94.9 kg)  02/14/20 210 lb 5 oz (95.4 kg)  01/11/20 207 lb 6.4 oz (94.1 kg)   38.28 kg/m  Started some low back pain several days ago - R side pain (Monday am)  Is ok now  Saw a little blood when she wiped after urinating  Thought she may have had a kidney stone  Some urgency of urination  No urine odor  A little nausea if eats the wrong thing Last kidney stone was a long time ago   Results for orders placed or performed in visit on 03/25/20  POCT Urinalysis Dipstick (Automated)  Result Value Ref Range   Color, UA Yellow    Clarity, UA Hazy    Glucose, UA Negative Negative   Bilirubin, UA Negative    Ketones, UA Negative    Spec Grav, UA 1.020 1.010 - 1.025   Blood, UA 200 Ery/uL    pH, UA 5.5 5.0 - 8.0   Protein, UA Positive (A) Negative   Urobilinogen, UA 0.2 0.2 or 1.0 E.U./dL   Nitrite, UA Negative    Leukocytes, UA Moderate (2+) (A) Negative     History of CKD stage 3  Lab Results  Component Value Date   CREATININE 1.69 (H) 01/11/2020   BUN 43 (H) 01/11/2020   NA 140 01/11/2020   K 5.1 01/11/2020   CL 101 01/11/2020   CO2 25 01/11/2020   Headache and dizziness   Since Friday -dizziness is worse  Never improved - just worse    Headaches - feels a lot of pressure over top of her head  Feels like head is a balloon- like it will blow up  Now feels like it is throbbing    Headache waxes and wanes-nothing seems to trigger it  Hurts on different sides of head    Sometimes back of head- base of skull   Sips on a glass of water    8-10 glasses per day   Dizziness is worse when she changes position - sitting to standing or when turning    Neck hurts most of the time -since her surgery    Recent MRI is reassuring (chronic vasc change and old cvas)  MR Brain Wo Contrast  Result Date: 03/25/2020 CLINICAL DATA:  Headache, chronic, no new features. Dizziness, persistent/recurrent, cardiac or vascular cause suspected episodic dizziness with headache EXAM: MRI HEAD WITHOUT CONTRAST TECHNIQUE: Multiplanar, multiecho pulse sequences of the brain and surrounding structures were obtained without intravenous contrast. COMPARISON:  12/19/2017 head CT and prior. 05/02/2017 MRI head and prior. FINDINGS: Brain: No diffusion-weighted signal abnormality. Remote left cerebellar microhemorrhages. Remote right corona radiata and right occipital insults with chronic blood product deposition. No midline shift, ventriculomegaly or extra-axial fluid collection. No mass lesion. Mild parenchymal volume loss with ex vacuo dilatation. Advanced chronic microvascular ischemic changes. Vascular: Normal flow voids. Skull and upper cervical spine: Upper cervical spondylosis. Sinuses/Orbits: Sequela of bilateral lens replacement. Clear paranasal sinuses. Trace bilateral mastoid effusions. Other: None.  IMPRESSION: 1. No acute intracranial abnormality. Advanced chronic microvascular ischemic changes. 2. Remote right corona radiata and right occipital insults. 3. Mild diffuse parenchymal volume loss. Remote left cerebellar microhemorrhages. 4. Upper cervical spondylosis. Electronically Signed   By: Primitivo Gauze M.D.   On: 03/25/2020 10:38     No falls or trauma in last few month   BP Readings from Last 3 Encounters:  03/25/20 124/66  02/14/20 122/68  01/11/20 116/74   Pulse Readings from Last 3 Encounters:  03/25/20 64  02/14/20 68  01/11/20 72    Has seen Dr Jaynee Eagles for  neurology (past dizziness with orthostatic hypotension and ? occip neuralgia)   Patient Active Problem List   Diagnosis Date Noted  . UTI (urinary tract infection) 03/25/2020  . History of CVA (cerebrovascular accident) 02/14/2020  . Headache 02/14/2020  . Diarrhea 12/26/2017  . Dizziness 12/26/2017  . Depression 10/26/2017  . H/O subdural hemorrhage 09/18/2017  . CAD S/P percutaneous coronary angioplasty 09/18/2017  . Closed fracture of nasal bones   . Epistaxis   . Elevated transaminase level 08/03/2017  . Radial artery injury, left, sequela 06/15/2017  . H/O: CVA (cerebrovascular accident) 05/26/2017  . Sick sinus syndrome ([place]) 05/25/2017  . Exertional chest pain 04/05/2017  . Chronic renal disease, stage III ([place]) 04/05/2017  . Anemia 04/05/2017  . Thoracic back pain 09/15/2016  . History of radius fracture 06/01/2016  . Status post cervical spinal fusion 08/18/2015  . Osteoarthritis, hip, bilateral 08/12/2014  . Hearing loss in right ear 06/12/2014  . Colon cancer screening 08/10/2013  . Seborrheic keratoses, inflamed 08/10/2013  . History of fall 04/10/2013  . Encounter for Medicare annual wellness exam 08/08/2012  . Gout 07/14/2012  . History of endometrial cancer 05/25/2012  . Degenerative joint disease of cervical spine 05/03/2011  . Other screening mammogram 03/31/2011  . Prediabetes 09/26/2007  . Obesity (BMI 30-39.9) 07/12/2007  . Essential hypertension 07/11/2007  . History of cardiomyopathy 07/11/2007  . Osteoarthritis 07/11/2007  . 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 Hypothyroidism 10/05/2006  . HYPERCHOLESTEROLEMIA, PURE 10/05/2006   Past Medical History:  Diagnosis Date  . Arthritis    OA  . CAD (coronary artery disease)    stent December 2018  . Cancer ([place])    endometrial  . GERD (gastroesophageal reflux disease)   . Hearing loss   . History of kidney stones   . Hyperlipidemia   . Hypertension   . Hypothyroid    "not in years"    . Shortness of breath dyspnea    04/25/17- not anymore   Past Surgical History:  Procedure Laterality Date  . ABDOMINAL HYSTERECTOMY    . ANTERIOR CERVICAL DECOMP/DISCECTOMY FUSION [name] 08/18/2015   Procedure: C5-6, C6-7 Anterior Cervical Discectomy and Fusion, Allograft, Plate;  Surgeon: Marybelle Killings, MD;  Location: [facility];  Service: Orthopedics;  Laterality: [name];  . bone spur removed Right    shoulder  . BOWEL RESECTION  07/04/2012   Procedure: SMALL BOWEL RESECTION;  Surgeon: Alvino Chapel, MD;  Location: [facility];  Service: Gynecology;;  . BREAST BIOPSY    . CHOLECYSTECTOMY    . CORONARY STENT INTERVENTION [name] 04/06/2017   Procedure: CORONARY STENT INTERVENTION;  Surgeon: Wellington Hampshire, MD;  Location: [facility];  Service: Cardiovascular;  Laterality: [name];  . DILATION AND CURETTAGE OF UTERUS  1999  . EYE SURGERY Bilateral    cataracts  . FALSE ANEURYSM REPAIR Right 04/26/2017   Procedure: REPAIR  OF PSUDOANEURYSM OF RIGHT RADIAL ARTERY;  Surgeon: Conrad Du Bois, MD;  Location: [facility];  Service: Vascular;  Laterality: Right;  . HAND SURGERY  12/2008   after hand fx/due to fall  . JOINT REPLACEMENT Bilateral 07/1998   knees  . KNEE ARTHROPLASTY  05/23/1998   total right  . LAPAROTOMY Bilateral 07/04/2012   Procedure: EXPLORATORY LAPAROTOMY TOTAL ABDOMINAL HYSTERECTOMY BILATERAL SALPINGO-OOPHORECTOMY with small bowel resection ;  Surgeon: Alvino Chapel, MD;  Location: [facility];  Service: Gynecology;  Laterality: Bilateral;  . LEFT HEART CATH AND CORONARY ANGIOGRAPHY [name] 04/06/2017   Procedure: LEFT HEART CATH AND CORONARY ANGIOGRAPHY;  Surgeon: Jolaine Artist, MD;  Location: [facility];  Service: Cardiovascular;  Laterality: [name];  . polyp removal  1999  . TUBAL LIGATION     Social History   Tobacco Use  . Smoking status: Never Smoker  . Smokeless tobacco: Never Used  Vaping Use  . Vaping Use: Never used  Substance Use Topics  . Alcohol use:  Never    Alcohol/week: 0.0 standard drinks  . Drug use: Never   Family History  Problem Relation Age of Onset  . Heart disease Father   . Heart attack Father   . Leukemia Brother   . Lung cancer Brother   . Leukemia Brother   . Stomach cancer Brother   . Heart disease Sister        CABG  . Stroke Sister   . Cancer Sister   . Bladder Cancer Sister   . Heart disease Brother        CABG  . Stroke Sister        "massive brain bleed"   Allergies  Allergen Reactions  . Allopurinol Nausea Only  . Lipitor [Atorvastatin Calcium] Other (See Comments)    Leg aches and aching all over  . Morphine And Related Other (See Comments)    Makes the patient "loopy,"   . Simvastatin Other (See Comments)    MUSCLE PAIN  . Tape Itching and Rash    "Cannot use paper tape" --- also causes blisters. Use plastic tape   Current Outpatient Medications on File Prior to Visit  Medication Sig Dispense Refill  . acetaminophen (TYLENOL) 650 MG CR tablet Take 650 mg by mouth every 8 (eight) hours as needed for pain.    [name] aspirin EC 81 MG tablet Take 1 tablet (81 mg total) by mouth daily. 30 tablet 3  . carvedilol (COREG) 25 MG tablet Take 0.5 tablets (12.5 mg total) by mouth in the morning AND 1 tablet (25 mg total) every evening. 45 tablet 12  . Cholecalciferol (VITAMIN D PO) Take by mouth.    . Colchicine (MITIGARE) 0.6 MG CAPS Take 1 capsule by mouth 2 (two) times daily. 60 capsule 5  . DULoxetine (CYMBALTA) 30 MG capsule Take 1 capsule (30 mg total) by mouth daily. 90 capsule 3  . fluticasone (FLONASE) 50 MCG/ACT nasal spray Place 2 sprays into both nostrils daily. 16 g 6  . losartan (COZAAR) 25 MG tablet Take 0.5 tablets (12.5 mg total) by mouth daily. 90 tablet 2  . nitroGLYCERIN (NITROSTAT) 0.4 MG SL tablet Place 1 tablet (0.4 mg total) under the tongue every 5 (five) minutes x 3 doses as needed for chest pain. 25 tablet 12   No current facility-administered medications on file prior to visit.      Review of Systems  Constitutional: Positive for fatigue. Negative for activity change, appetite change, fever  and unexpected weight change.  HENT: Positive for sinus pressure and sinus pain. Negative for ear pain, rhinorrhea, sore throat and voice change.        Ears feel irritated  Eyes: Negative for pain, redness and visual disturbance.  Respiratory: Negative for cough, shortness of breath and wheezing.   Cardiovascular: Negative for chest pain, palpitations and leg swelling.  Gastrointestinal: Negative for abdominal pain, blood in stool, constipation and diarrhea.  Endocrine: Negative for polydipsia and polyuria.  Genitourinary: Negative for dysuria, frequency and urgency.  Musculoskeletal: Negative for arthralgias, back pain and myalgias.  Skin: Negative for pallor and rash.  Allergic/Immunologic: Negative for environmental allergies.  Neurological: Positive for light-headedness and headaches. Negative for dizziness, tremors, seizures, syncope, facial asymmetry, speech difficulty, weakness and numbness.  Hematological: Negative for adenopathy. Does not bruise/bleed easily.  Psychiatric/Behavioral: Negative for decreased concentration and dysphoric mood. The patient is not nervous/anxious.        Objective:   Physical Exam Constitutional:      General: She is not in acute distress.    Appearance: She is well-developed. She is obese. She is not ill-appearing or diaphoretic.  HENT:     Head: Normocephalic and atraumatic.     Ears:     Comments: Hard of hearing  W/o hearing aides today Eyes:     General: No visual field deficit or scleral icterus.    Extraocular Movements: Extraocular movements intact.     Right eye: No nystagmus.     Left eye: No nystagmus.     Conjunctiva/sclera: Conjunctivae normal.     Pupils: Pupils are equal, round, and reactive to light. Pupils are equal.  Neck:     Thyroid: No thyromegaly.     Vascular: No carotid bruit or JVD.     Comments: Some  tenderness over high CS No crepitus  Cardiovascular:     Rate and Rhythm: Normal rate and regular rhythm.     Pulses: Normal pulses.     Heart sounds: Normal heart sounds. No murmur heard.  No gallop.      Comments: BP is 121/68 lying  118/61 sitting   102/55 standing with dizzines  (pulse in the 60s) Pulmonary:     Effort: Pulmonary effort is normal. No respiratory distress.     Breath sounds: Normal breath sounds. No wheezing or rales.  Chest:     Chest wall: No tenderness.  Abdominal:     General: Bowel sounds are normal. There is no distension or abdominal bruit.     Palpations: Abdomen is soft. There is no mass.     Tenderness: There is no abdominal tenderness.  Musculoskeletal:     Cervical back: Normal range of motion and neck supple. Tenderness present. No rigidity.     Right lower leg: No edema.     Left lower leg: No edema.  Lymphadenopathy:     Cervical: No cervical adenopathy.  Skin:    General: Skin is warm and dry.     Coloration: Skin is not pale.     Findings: No erythema or rash.  Neurological:     Mental Status: She is alert.     Cranial Nerves: No cranial nerve deficit, dysarthria or facial asymmetry.     Sensory: Sensation is intact. No sensory deficit.     Motor: No weakness, tremor, abnormal muscle tone or pronator drift.     Coordination: Coordination abnormal. Finger-Nose-Finger Test normal.     Gait: Gait abnormal.     Deep  Tendon Reflexes: Reflexes are normal and symmetric. Reflexes normal.     Comments: slt L lid lag-her baseline  Poor balance in setting of dizziness /with orthostatic changes  Cannot assess romberg well  Psychiatric:        Mood and Affect: Mood normal.     Comments: Pleasant  Mentally sharp           Assessment & Plan:   Problem List Items Addressed This Visit      Cardiovascular and Mediastinum   Essential hypertension    Orthostatic changes with dizziness May need adj in carvedilol again          Genitourinary   Chronic renal disease, stage III ([place])    Suspect uti and recent stone Enc inc water intake      UTI (urinary tract infection)    Pt saw blood  Had R sided flank pain - felt like she passed a stone /now better but some urgency ua pos for blood and protein and leuk tx with keflex 500 mg (tid since also sinusitis) and cx pending Enc fluids  She will let us know if she passes a stone but suspect she already did        Relevant Medications   cephALEXin (KEFLEX) 500 MG capsule   Other Relevant Orders   Urine Culture     Other   Dizziness - Primary    Dizzy with position change- orthostatic change sit to stand (this has happened before) Messaged cardiology (Dr Martinique and NP Marilynn Rail) to ask if carvedilol can be dec any further  Pend response and plan   Reassuring exam otherwise       Headache    bp is not high (it is low with dizziess) Reassuring MRI report  Baseline neuro exam  Some sinus tenderness (marked in L maxillary area)  Also congestion  Will tx with keflex 500 mg tid (also for uti) and prednisone taper 40 mg (disc side eff)  Will update- if no imp may req neuro attn       Other Visit Diagnoses    Dysuria       Relevant Orders   POCT Urinalysis Dipstick (Automated) (Completed)

## 2020-03-25 NOTE — Telephone Encounter (Signed)
Thank you, I also contacted  her pcp Dr. Glori Bickers thanks

## 2020-03-25 NOTE — Assessment & Plan Note (Signed)
bp is not high (it is low with dizziess) Reassuring MRI report  Baseline neuro exam  Some sinus tenderness (marked in L maxillary area)  Also congestion  Will tx with keflex 500 mg tid (also for uti) and prednisone taper 40 mg (disc side eff)  Will update- if no imp may req neuro attn

## 2020-03-25 NOTE — Telephone Encounter (Signed)
pts daughter [name] called and said that pt is having severe headaches so bad she can't sleep or do anything. She is having a MRI today and would like to see Dr. Jaynee Eagles asap. Also having drooping of one eye

## 2020-03-25 NOTE — Assessment & Plan Note (Signed)
Dizzy with position change- orthostatic change sit to stand (this has happened before) Messaged cardiology (Dr Martinique and NP Marilynn Rail) to ask if carvedilol can be dec any further  Pend response and plan   Reassuring exam otherwise

## 2020-03-25 NOTE — Assessment & Plan Note (Signed)
Orthostatic changes with dizziness May need adj in carvedilol again

## 2020-03-26 NOTE — Telephone Encounter (Signed)
Left VM on main # which is pt's daughter [name] phone requesting her or pt to call the office back

## 2020-03-29 LAB — URINE CULTURE
MICRO NUMBER:: 11286748
SPECIMEN QUALITY:: ADEQUATE

## 2020-03-31 DIAGNOSIS — Z006 Encounter for examination for normal comparison and control in clinical research program: Secondary | ICD-10-CM

## 2020-03-31 NOTE — Research (Signed)
[facility] 3 Year Follow-up  No chest pain and states she has been compliant with medication. No adverse events to report.   Current Outpatient Medications:  .  acetaminophen (TYLENOL) 650 MG CR tablet, Take 650 mg by mouth every 8 (eight) hours as needed for pain., Disp: , Rfl:  .  aspirin EC 81 MG tablet, Take 1 tablet (81 mg total) by mouth daily., Disp: 30 tablet, Rfl: 3 .  cephALEXin (KEFLEX) 500 MG capsule, Take 1 capsule (500 mg total) by mouth 3 (three) times daily., Disp: 21 capsule, Rfl: 0 .  Cholecalciferol (VITAMIN D PO), Take by mouth., Disp: , Rfl:  .  Colchicine (MITIGARE) 0.6 MG CAPS, Take 1 capsule by mouth 2 (two) times daily., Disp: 60 capsule, Rfl: 5 .  DULoxetine (CYMBALTA) 30 MG capsule, Take 1 capsule (30 mg total) by mouth daily., Disp: 90 capsule, Rfl: 3 .  fluticasone (FLONASE) 50 MCG/ACT nasal spray, Place 2 sprays into both nostrils daily., Disp: 16 g, Rfl: 6 .  losartan (COZAAR) 25 MG tablet, Take 0.5 tablets (12.5 mg total) by mouth daily., Disp: 90 tablet, Rfl: 2 .  nitroGLYCERIN (NITROSTAT) 0.4 MG SL tablet, Place 1 tablet (0.4 mg total) under the tongue every 5 (five) minutes x 3 doses as needed for chest pain., Disp: 25 tablet, Rfl: 12 .  predniSONE (DELTASONE) 10 MG tablet, Take 4 pills once daily by mouth for 3 days, then 3 pills daily for 3 days, then 2 pills daily for 3 days then 1 pill daily for 3 days then stop, Disp: 30 tablet, Rfl: 0

## 2020-04-02 MED ORDER — CARVEDILOL 6.25 MG PO TABS
6.2500 mg | ORAL_TABLET | Freq: Two times a day (BID) | ORAL | 1 refills | Status: DC
Start: 2020-04-02 — End: 2020-05-01

## 2020-04-02 NOTE — Telephone Encounter (Signed)
[name] notified of Dr. Marliss Coots comments and Dr. Doug Sou recommendations. New Rx sent to pharmacy and [name] will keep [place] posted

## 2020-04-28 NOTE — Progress Notes (Signed)
Cardiology Office Note    Date:  05/01/2020   ID:  [name], DOB Sep 14, 1932, MRN [id number]  PCP:  Judy Pimple, MD  Cardiologist:  Dr. [place]  Chief Complaint  Patient presents with  . Dizziness    History of Present Illness:  Valerie Ramirez is a 87 y.o. female with PMH of HTN, HLD, hypothyroidism and CAD.  Echocardiogram obtained on 04/05/2017 showed EF 55-60%, grade 2 DD, PA peak pressure 34 mmHg.  She was ruled in for NSTEMI.  She eventually underwent cardiac catheterization on 04/06/2017 which showed a 95% mid left circumflex lesion treated with 3.5 x 28 mm DES, there was a 40% ostial to proximal LAD residual and to 60% proximal RCA residual.  Post procedure, patient was placed on dual antiplatelet therapy.  Due to her intolerance of Lipitor and simvastatin, she was started on low-dose Crestor.    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 Imogene Burn on 04/26/2017.  Daughter at the time also mentioned some mental status change immediately after the discharge, they did not seek medical attention at the time.    MRI unfortunately did show punctate acute/early subacute microvascular infarct in the right anterior caudate body, chronic right occipital infarction, small chronic lacunar infarct in the right putamen extending into the mid corona radiata.  .  She was admitted in June 2019 after a fall and facial trauma with nasal fracture and subdural hematoma. DAPT was held. Later resumed as outpatient and repeat CT showed resolution of hematoma. Fall was felt to be related to orthostatic hypotension and antihypertensives were reduced with significant improvement in how she felt.   She did have Covid 19 in July 2021. She had some dizziness and passing out at that time. Medications were reduced- lisinopril was stopped and Coreg reduced.   She is seen with her daughter today. She states she is still  having dizzy spells. This is worse if she does any stooping. She feels it coming up from her shoulders to her neck to the top of her head. Her head feels hot. She has some episodes of near syncope.  Denies any chest pain, SOB, palpitations. Can't wear her hearing aids because of ringing in her ears.   Past Medical History:  Diagnosis Date  . Arthritis    OA  . CAD (coronary artery disease)    stent December 2018  . Cancer (HCC)    endometrial  . GERD (gastroesophageal reflux disease)   . Hearing loss   . History of kidney stones   . Hyperlipidemia   . Hypertension   . Hypothyroid    "not in years"  . Shortness of breath dyspnea    04/25/17- not anymore    Past Surgical History:  Procedure Laterality Date  . ABDOMINAL HYSTERECTOMY    . ANTERIOR CERVICAL DECOMP/DISCECTOMY FUSION [name] 08/18/2015   Procedure: C5-6, C6-7 Anterior Cervical Discectomy and Fusion, Allograft, Plate;  Surgeon: Eldred Manges, MD;  Location: [facility];  Service: Orthopedics;  Laterality: [name];  . bone spur removed Right    shoulder  . BOWEL RESECTION  07/04/2012   Procedure: SMALL BOWEL RESECTION;  Surgeon: Jeannette Corpus, MD;  Location: [facility];  Service: Gynecology;;  . BREAST BIOPSY    . CHOLECYSTECTOMY    . CORONARY STENT INTERVENTION [name] 04/06/2017   Procedure: CORONARY STENT INTERVENTION;  Surgeon: Iran Ouch, MD;  Location: [facility];  Service: Cardiovascular;  Laterality: [name];  . DILATION AND CURETTAGE OF UTERUS  1999  . EYE SURGERY Bilateral    cataracts  . FALSE ANEURYSM REPAIR Right 04/26/2017   Procedure: 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;  Surgeon: Conrad Hughestown, MD;  Location: [facility];  Service: Vascular;  Laterality: Right;  . HAND SURGERY  12/2008   after hand fx/due to fall  . JOINT REPLACEMENT Bilateral 07/1998   knees  . KNEE ARTHROPLASTY  05/23/1998   total right  . LAPAROTOMY Bilateral 07/04/2012   Procedure: EXPLORATORY LAPAROTOMY TOTAL ABDOMINAL  HYSTERECTOMY BILATERAL SALPINGO-OOPHORECTOMY with small bowel resection ;  Surgeon: Alvino Chapel, MD;  Location: [facility];  Service: Gynecology;  Laterality: Bilateral;  . LEFT HEART CATH AND CORONARY ANGIOGRAPHY [name] 04/06/2017   Procedure: LEFT HEART CATH AND CORONARY ANGIOGRAPHY;  Surgeon: Jolaine Artist, MD;  Location: [facility];  Service: Cardiovascular;  Laterality: [name];  . polyp removal  1999  . TUBAL LIGATION      Current Medications: Outpatient Medications Prior to Visit  Medication Sig Dispense Refill  . acetaminophen (TYLENOL) 650 MG CR tablet Take 650 mg by mouth every 8 (eight) hours as needed for pain.    [name] aspirin EC 81 MG tablet Take 1 tablet (81 mg total) by mouth daily. 30 tablet 3  . Cholecalciferol (VITAMIN D PO) Take by mouth.    . Colchicine (MITIGARE) 0.6 MG CAPS Take 1 capsule by mouth 2 (two) times daily. 60 capsule 5  . DULoxetine (CYMBALTA) 30 MG capsule Take 1 capsule (30 mg total) by mouth daily. 90 capsule 3  . fluticasone (FLONASE) 50 MCG/ACT nasal spray Place 2 sprays into both nostrils daily. 16 g 6  . nitroGLYCERIN (NITROSTAT) 0.4 MG SL tablet Place 1 tablet (0.4 mg total) under the tongue every 5 (five) minutes x 3 doses as needed for chest pain. 25 tablet 12  . carvedilol (COREG) 6.25 MG tablet Take 1 tablet (6.25 mg total) by mouth 2 (two) times daily with a meal. 180 tablet 1  . cephALEXin (KEFLEX) 500 MG capsule Take 1 capsule (500 mg total) by mouth 3 (three) times daily. 21 capsule 0  . losartan (COZAAR) 25 MG tablet Take 0.5 tablets (12.5 mg total) by mouth daily. 90 tablet 2  . predniSONE (DELTASONE) 10 MG tablet Take 4 pills once daily by mouth for 3 days, then 3 pills daily for 3 days, then 2 pills daily for 3 days then 1 pill daily for 3 days then stop 30 tablet 0   No facility-administered medications prior to visit.     Allergies:   Allopurinol, Lipitor [atorvastatin calcium], Morphine and related, Simvastatin, and Tape    Social History   Socioeconomic History  . Marital status: Widowed    Spouse name: Not on file  . Number of children: 2  . Years of education: Not on file  . Highest education level: High school graduate  Occupational History  . Not on file  Tobacco Use  . Smoking status: Never Smoker  . Smokeless tobacco: Never Used  Vaping Use  . Vaping Use: Never used  Substance and Sexual Activity  . Alcohol use: Never    Alcohol/week: 0.0 standard drinks  . Drug use: Never  . Sexual activity: Never    Birth control/protection: Post-menopausal  Other Topics Concern  . Not on file  Social History Narrative  . Not on file   Social  Determinants of Health   Financial Resource Strain: Not on file  Food Insecurity: Not on file  Transportation Needs: Not on file  Physical Activity: Not on file  Stress: Not on file  Social Connections: Not on file     Family History:  The patient's family history includes Bladder Cancer in her sister; Cancer in her sister; Heart attack in her father; Heart disease in her brother, father, and sister; Leukemia in her brother and brother; Lung cancer in her brother; Stomach cancer in her brother; Stroke in her sister and sister.   ROS:   Please see the history of present illness.    ROS All other systems reviewed and are negative.   PHYSICAL EXAM:   VS:  BP 125/80   Pulse 81   Ht 5\' 2"  (1.575 m)   Wt 209 lb 6.4 oz (95 kg)   SpO2 97%   BMI 38.30 kg/m    GENERAL:  Well appearing obese WF in NAD HEENT:  PERRL, EOMI, sclera are clear. Oropharynx is clear. NECK:  No jugular venous distention, carotid upstroke brisk and symmetric, no bruits, no thyromegaly or adenopathy LUNGS:  Clear to auscultation bilaterally CHEST:  Unremarkable HEART:  RRR,  PMI not displaced or sustained,S1 and S2 within normal limits, no S3, no S4: no clicks, no rubs, no murmurs ABD:  Soft, nontender. BS +, no masses or bruits. No hepatomegaly, no splenomegaly EXT:  2 + pulses  throughout, tr edema, no cyanosis no clubbing SKIN:  Warm and dry.  No rashes NEURO:  Alert and oriented x 3. Cranial nerves II through XII intact. PSYCH:  Cognitively intact    Wt Readings from Last 3 Encounters:  05/01/20 209 lb 6.4 oz (95 kg)  03/25/20 209 lb 5 oz (94.9 kg)  02/14/20 210 lb 5 oz (95.4 kg)      Studies/Labs Reviewed:   EKG:  EKG is ordered today. NSR with nonspecific TWA.  I have personally reviewed and interpreted this study.   Recent Labs: 12/06/2019: Hemoglobin 12.2; Platelets 205 01/11/2020: BUN 43; Creatinine, Ser 1.69; Potassium 5.1; Sodium 140   Lipid Panel    Component Value Date/Time   CHOL 132 12/11/2018 1001   CHOL 123 05/18/2017 1154   TRIG 136.0 12/11/2018 1001   HDL 48.40 12/11/2018 1001   HDL 52 05/18/2017 1154   CHOLHDL 3 12/11/2018 1001   VLDL 27.2 12/11/2018 1001   LDLCALC 56 12/11/2018 1001   LDLCALC 28 05/18/2017 1154   LDLDIRECT 136.0 10/31/2015 0959    Additional studies/ records that were reviewed today include:   Cath 04/06/2017 Conclusion     Mid Cx lesion is 95% stenosed.  A drug-eluting stent was successfully placed using a STENT SVELTE RX 3.50 X 28MM.  Post intervention, there is a 0% residual stenosis.  Successful angioplasty and drug-eluting stent placement to the mid left circumflex.    Echo 04/05/2017 LV EF: 55% - 60%  Study Conclusions  - Left ventricle: The cavity size was normal. Wall thickness was increased in a pattern of severe LVH. Systolic function was normal. The estimated ejection fraction was in the range of 55% to 60%. Wall motion was normal; there were no regional wall motion abnormalities. Doppler parameters are consistent with pseudonormal left ventricular relaxation (grade 2 diastolic dysfunction). The E/e&' ratio is >15, suggesting elevated LV filling pressure. - Aortic valve: Sclerosis without stenosis. Transvalvular velocity was minimally increased. Mean  gradient (S): 7 mm Hg. - Mitral valve: Mildly thickened leaflets .  There was trivial regurgitation. - Left atrium: Moderately dilated. - Tricuspid valve: There was mild regurgitation. - Pulmonary arteries: PA peak pressure: 34 mm Hg (S). - Inferior vena cava: The vessel was normal in size. The respirophasic diameter changes were in the normal range (>= 50%), consistent with normal central venous pressure.  Impressions:  - LVEF 55-60%, severe LVH, normal wall motion, grade 2 DD with elevated LV filling pressure, aortic valve sclerosis, trivial MR, moderate LAE, mild TR, RVSP 34 mmHg, normal IVC.    ASSESSMENT:    1. Coronary artery disease of native artery of native heart with stable angina pectoris ([place])   2. Mixed hyperlipidemia   3. Orthostatic hypotension   4. Syncope and collapse      PLAN:  In order of problems listed above:  1. CAD: s/p DES of LCx for NSTEMI in Dec. 2018. Continue aspirin,  carvedilol, and Crestor. She is asymptomatic.   2. Hyperlipidemia: On Crestor 10 mg and tolerating well. Will check fasting lab today.  3. Hypertension: Blood pressure is well controlled but she continues to have significant symptoms of orthostasis. We need to allow more liberal BP given history of syncope. I recommend she stop losartan. Reduce Coreg to 3.125 mg bid.    4.    CVA: Confirmed on MR post cath.  Clinically improved.  5.   Hypothyroidism: Managed by primary care provider.    6.   Recurrent falls with nasal fracture and subdural hematoma- secondary to othostatic hypotension.  See above.     Medication Adjustments/Labs and Tests Ordered: Current medicines are reviewed at length with the patient today.  Concerns regarding medicines are outlined above.  Medication changes, Labs and Tests ordered today are listed in the Patient Instructions below. Patient Instructions  Stop taking losartan  Reduce Coreg (carvedilol) to 3.125 mg twice a day  We will check  lab work today.      Signed, Nik Gorrell [place], MD  05/01/2020 11:44 AM    [facility] [address], [place], [place]  [address] Phone: [phone]; Fax: [phone]

## 2020-05-01 ENCOUNTER — Other Ambulatory Visit: Payer: Self-pay

## 2020-05-01 ENCOUNTER — Encounter: Payer: Self-pay | Admitting: Cardiology

## 2020-05-01 ENCOUNTER — Ambulatory Visit (INDEPENDENT_AMBULATORY_CARE_PROVIDER_SITE_OTHER): Payer: Medicare Other | Admitting: Cardiology

## 2020-05-01 VITALS — BP 125/80 | HR 81 | Ht 62.0 in | Wt 209.4 lb

## 2020-05-01 DIAGNOSIS — R55 Syncope and collapse: Secondary | ICD-10-CM

## 2020-05-01 DIAGNOSIS — I25118 Atherosclerotic heart disease of native coronary artery with other forms of angina pectoris: Secondary | ICD-10-CM | POA: Diagnosis not present

## 2020-05-01 DIAGNOSIS — E782 Mixed hyperlipidemia: Secondary | ICD-10-CM | POA: Diagnosis not present

## 2020-05-01 DIAGNOSIS — I951 Orthostatic hypotension: Secondary | ICD-10-CM

## 2020-05-01 MED ORDER — ROSUVASTATIN CALCIUM 10 MG PO TABS
10.0000 mg | ORAL_TABLET | Freq: Every day | ORAL | 3 refills | Status: DC
Start: 2020-05-01 — End: 2021-06-24

## 2020-05-01 MED ORDER — CARVEDILOL 3.125 MG PO TABS
3.1250 mg | ORAL_TABLET | Freq: Two times a day (BID) | ORAL | 3 refills | Status: DC
Start: 2020-05-01 — End: 2021-06-24

## 2020-05-01 NOTE — Patient Instructions (Signed)
Stop taking losartan  Reduce Coreg (carvedilol) to 3.125 mg twice a day  We will check lab work today.

## 2020-05-02 LAB — COMPREHENSIVE METABOLIC PANEL
ALT: 16 IU/L (ref 0–32)
AST: 20 IU/L (ref 0–40)
Albumin/Globulin Ratio: 1.5 (ref 1.2–2.2)
Albumin: 4.2 g/dL (ref 3.6–4.6)
Alkaline Phosphatase: 144 IU/L — ABNORMAL HIGH (ref 44–121)
BUN/Creatinine Ratio: 18 (ref 12–28)
BUN: 27 mg/dL (ref 8–27)
Bilirubin Total: 0.5 mg/dL (ref 0.0–1.2)
CO2: 24 mmol/L (ref 20–29)
Calcium: 9.6 mg/dL (ref 8.7–10.3)
Chloride: 99 mmol/L (ref 96–106)
Creatinine, Ser: 1.54 mg/dL — ABNORMAL HIGH (ref 0.57–1.00)
GFR calc Af Amer: 35 mL/min/{1.73_m2} — ABNORMAL LOW (ref >59)
GFR calc non Af Amer: 31 mL/min/{1.73_m2} — ABNORMAL LOW (ref >59)
Globulin, Total: 2.8 g/dL (ref 1.5–4.5)
Glucose: 204 mg/dL — ABNORMAL HIGH (ref 65–99)
Potassium: 4.8 mmol/L (ref 3.5–5.2)
Sodium: 138 mmol/L (ref 134–144)
Total Protein: 7 g/dL (ref 6.0–8.5)

## 2020-05-02 LAB — LIPID PANEL
Chol/HDL Ratio: 2.9 ratio (ref 0.0–4.4)
Cholesterol, Total: 159 mg/dL (ref 100–199)
HDL: 55 mg/dL (ref >39)
LDL Chol Calc (NIH): 72 mg/dL (ref 0–99)
Triglycerides: 196 mg/dL — ABNORMAL HIGH (ref 0–149)
VLDL Cholesterol Cal: 32 mg/dL (ref 5–40)

## 2020-05-23 ENCOUNTER — Other Ambulatory Visit: Payer: Self-pay | Admitting: Cardiology

## 2020-06-17 ENCOUNTER — Other Ambulatory Visit: Payer: Self-pay | Admitting: General Practice

## 2020-06-19 ENCOUNTER — Other Ambulatory Visit: Payer: Self-pay | Admitting: General Practice

## 2020-06-19 DIAGNOSIS — H353131 Nonexudative age-related macular degeneration, bilateral, early dry stage: Secondary | ICD-10-CM | POA: Diagnosis not present

## 2020-06-19 DIAGNOSIS — H524 Presbyopia: Secondary | ICD-10-CM | POA: Diagnosis not present

## 2020-07-15 ENCOUNTER — Other Ambulatory Visit: Payer: Self-pay

## 2020-07-15 ENCOUNTER — Emergency Department: Payer: Medicare Other

## 2020-07-15 ENCOUNTER — Emergency Department
Admission: EM | Admit: 2020-07-15 | Discharge: 2020-07-15 | Disposition: A | Discharge status: Home / Self Care | Payer: Medicare Other | Attending: Emergency Medicine | Admitting: Emergency Medicine

## 2020-07-15 DIAGNOSIS — M5441 Lumbago with sciatica, right side: Secondary | ICD-10-CM | POA: Insufficient documentation

## 2020-07-15 DIAGNOSIS — M533 Sacrococcygeal disorders, not elsewhere classified: Secondary | ICD-10-CM | POA: Diagnosis not present

## 2020-07-15 DIAGNOSIS — M545 Low back pain, unspecified: Secondary | ICD-10-CM | POA: Diagnosis not present

## 2020-07-15 DIAGNOSIS — E039 Hypothyroidism, unspecified: Secondary | ICD-10-CM | POA: Diagnosis not present

## 2020-07-15 DIAGNOSIS — Z7982 Long term (current) use of aspirin: Secondary | ICD-10-CM | POA: Insufficient documentation

## 2020-07-15 DIAGNOSIS — Z79899 Other long term (current) drug therapy: Secondary | ICD-10-CM | POA: Diagnosis not present

## 2020-07-15 DIAGNOSIS — Z8542 Personal history of malignant neoplasm of other parts of uterus: Secondary | ICD-10-CM | POA: Insufficient documentation

## 2020-07-15 DIAGNOSIS — I10 Essential (primary) hypertension: Secondary | ICD-10-CM | POA: Diagnosis not present

## 2020-07-15 DIAGNOSIS — M5431 Sciatica, right side: Secondary | ICD-10-CM

## 2020-07-15 DIAGNOSIS — Z96653 Presence of artificial knee joint, bilateral: Secondary | ICD-10-CM | POA: Diagnosis not present

## 2020-07-15 DIAGNOSIS — M79606 Pain in leg, unspecified: Secondary | ICD-10-CM | POA: Diagnosis not present

## 2020-07-15 DIAGNOSIS — M25559 Pain in unspecified hip: Secondary | ICD-10-CM | POA: Diagnosis not present

## 2020-07-15 DIAGNOSIS — Z8673 Personal history of transient ischemic attack (TIA), and cerebral infarction without residual deficits: Secondary | ICD-10-CM | POA: Diagnosis not present

## 2020-07-15 DIAGNOSIS — I2510 Atherosclerotic heart disease of native coronary artery without angina pectoris: Secondary | ICD-10-CM | POA: Diagnosis not present

## 2020-07-15 DIAGNOSIS — W19XXXA Unspecified fall, initial encounter: Secondary | ICD-10-CM

## 2020-07-15 MED ORDER — OXYCODONE-ACETAMINOPHEN 5-325 MG PO TABS: 1.0000 | ORAL_TABLET | Freq: Four times a day (QID) | ORAL | 0 refills | Status: DC | PRN

## 2020-07-15 MED ORDER — DEXAMETHASONE SODIUM PHOSPHATE 10 MG/ML IJ SOLN
10.0000 mg | Freq: Once | INTRAMUSCULAR | Status: AC
  Administered 2020-07-15: 10 mg via INTRAMUSCULAR
  Filled 2020-07-15: qty 1

## 2020-07-15 MED ORDER — METHOCARBAMOL 500 MG PO TABS: 500.0000 mg | ORAL_TABLET | Freq: Four times a day (QID) | ORAL | 0 refills | Status: DC

## 2020-07-15 MED ORDER — ORPHENADRINE CITRATE 30 MG/ML IJ SOLN
30.0000 mg | Freq: Once | INTRAMUSCULAR | Status: AC
  Administered 2020-07-15: 30 mg via INTRAMUSCULAR
  Filled 2020-07-15: qty 2

## 2020-07-15 MED ORDER — PREDNISONE 10 MG PO TABS: 10.0000 mg | ORAL_TABLET | Freq: Every day | ORAL | 0 refills | Status: DC

## 2020-07-15 MED ORDER — OXYCODONE-ACETAMINOPHEN 5-325 MG PO TABS
1.0000 | ORAL_TABLET | Freq: Once | ORAL | Status: AC
  Administered 2020-07-15: 1 via ORAL
  Filled 2020-07-15: qty 1

## 2020-07-15 NOTE — ED Triage Notes (Signed)
Pt c/o right lower back pain that radiating down into the leg since last night, denies injury,. States it is worse with sitting. And insist on standing

## 2020-07-15 NOTE — ED Notes (Signed)
Assisted patient to restroom. Patient SOB upon walking. O2 95% on room air.

## 2020-07-15 NOTE — ED Notes (Signed)
See triage note  Presents with right hip pain  States she fell back in Feb  Has had some pain to that hip but states since last pm the pain is worse  Moving into right leg
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
## 2020-07-23 ENCOUNTER — Ambulatory Visit: Payer: Medicare Other | Admitting: Orthopaedic Surgery

## 2020-08-09 ENCOUNTER — Other Ambulatory Visit: Payer: Self-pay | Admitting: Cardiology

## 2020-08-19 DIAGNOSIS — H903 Sensorineural hearing loss, bilateral: Secondary | ICD-10-CM | POA: Diagnosis not present

## 2020-08-19 DIAGNOSIS — H6123 Impacted cerumen, bilateral: Secondary | ICD-10-CM | POA: Diagnosis not present

## 2020-08-27 ENCOUNTER — Other Ambulatory Visit: Payer: Self-pay | Admitting: Family Medicine

## 2020-10-10 ENCOUNTER — Other Ambulatory Visit: Payer: Self-pay | Admitting: Family Medicine

## 2021-02-06 NOTE — Progress Notes (Signed)
Cardiology Office Note    Date:  02/09/2021   ID:  Valerie Ramirez, DOB [birth year], MRN [id number]  PCP:  Abner Greenspan, MD  Cardiologist:  Dr. Martinique  Chief Complaint  Patient presents with   Coronary Artery Disease    History of Present Illness:  Valerie Ramirez is a 87 y.o. female with PMH of HTN, HLD, hypothyroidism and CAD.  Echocardiogram obtained on 04/05/2017 showed EF 55-60%, grade 2 DD, PA peak pressure 34 mmHg.  She was ruled in for NSTEMI.  She eventually underwent cardiac catheterization on 04/06/2017 which showed a 95% mid left circumflex lesion treated with 3.5 x 28 mm DES, there was a 40% ostial to proximal LAD residual and to 60% proximal RCA residual.   Due to her intolerance of Lipitor and simvastatin, she was started on low-dose Crestor.    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 Bridgett Larsson on 04/26/2017.  Daughter at the time also mentioned some mental status change immediately after the discharge, they did not seek medical attention at the time.    MRI unfortunately did show punctate acute/early subacute microvascular infarct in the right anterior caudate body, chronic right occipital infarction, small chronic lacunar infarct in the right putamen extending into the mid corona radiata.  .  She was admitted in June 2019 after a fall and facial trauma with nasal fracture and subdural hematoma. DAPT was held. Later resumed as outpatient and repeat CT showed resolution of hematoma. Fall was felt to be related to orthostatic hypotension and antihypertensives were reduced with significant improvement in how she felt.   She did have Covid 19 in July 2021. She had some dizziness and passing out at that time. Medications were reduced- lisinopril was stopped and Coreg reduced.   She is seen with her daughter today. She states she is still having dizzy spells. Has Headaches on a daily basis  beginning posteriorly in the neck and going to the top of her head. Will take up to 6 ASA for relief.  Denies any chest pain, SOB, palpitations.    Past Medical History:  Diagnosis Date   Arthritis    OA   CAD (coronary artery disease)    stent December 2018   Cancer [facility])    endometrial   GERD (gastroesophageal reflux disease)    Hearing loss    History of kidney stones    Hyperlipidemia    Hypertension    Hypothyroid    "not in years"   Shortness of breath dyspnea    04/25/17- not anymore    Past Surgical History:  Procedure Laterality Date   ABDOMINAL HYSTERECTOMY     ANTERIOR CERVICAL DECOMP/DISCECTOMY FUSION [name] 08/18/2015   Procedure: C5-6, C6-7 Anterior Cervical Discectomy and Fusion, Allograft, Plate;  Surgeon: Marybelle Killings, MD;  Location: [place];  Service: Orthopedics;  Laterality: [name];   bone spur removed Right    shoulder   BOWEL RESECTION  07/04/2012   Procedure: SMALL BOWEL RESECTION;  Surgeon: Alvino Chapel, MD;  Location: [facility];  Service: Gynecology;;   BREAST BIOPSY     CHOLECYSTECTOMY     CORONARY STENT INTERVENTION [name] 04/06/2017   Procedure: CORONARY STENT INTERVENTION;  Surgeon: Wellington Hampshire, MD;  Location: [place] [facility];  Service: Cardiovascular;  Laterality: [name];   DILATION AND CURETTAGE OF UTERUS  1999   EYE SURGERY Bilateral  cataracts   FALSE ANEURYSM REPAIR Right 04/26/2017   Procedure: 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;  Surgeon: Conrad Golf Manor, MD;  Location: [place];  Service: Vascular;  Laterality: Right;   HAND SURGERY  12/2008   after hand fx/due to fall   JOINT REPLACEMENT Bilateral 07/1998   knees   KNEE ARTHROPLASTY  05/23/1998   total right   LAPAROTOMY Bilateral 07/04/2012   Procedure: EXPLORATORY LAPAROTOMY TOTAL ABDOMINAL HYSTERECTOMY BILATERAL SALPINGO-OOPHORECTOMY with small bowel resection ;  Surgeon: Alvino Chapel, MD;  [facility];  Service: Gynecology;  Laterality: Bilateral;   LEFT  HEART CATH AND CORONARY ANGIOGRAPHY [name] 04/06/2017   Procedure: LEFT HEART CATH AND CORONARY ANGIOGRAPHY;  Surgeon: Jolaine Artist, MD;  Location: [place] [facility];  Service: Cardiovascular;  Laterality: [name];   polyp removal  1999   TUBAL LIGATION      Current Medications: Outpatient Medications Prior to Visit  Medication Sig Dispense Refill   acetaminophen (TYLENOL) 650 MG CR tablet Take 650 mg by mouth every 8 (eight) hours as needed for pain.     aspirin EC 81 MG tablet Take 1 tablet (81 mg total) by mouth daily. 30 tablet 3   carvedilol (COREG) 3.125 MG tablet Take 1 tablet (3.125 mg total) by mouth 2 (two) times daily. 180 tablet 3   Cholecalciferol (VITAMIN D PO) Take by mouth.     Colchicine 0.6 MG CAPS Take 1 capsule by mouth in the morning and at bedtime. 60 capsule 1   DULoxetine (CYMBALTA) 30 MG capsule Take 1 capsule (30 mg total) by mouth daily. 90 capsule 3   fluticasone (FLONASE) 50 MCG/ACT nasal spray Place 2 sprays into both nostrils daily. 16 g 6   nitroGLYCERIN (NITROSTAT) 0.4 MG SL tablet Place 1 tablet (0.4 mg total) under the tongue every 5 (five) minutes x 3 doses as needed for chest pain. 25 tablet 12   rosuvastatin (CRESTOR) 10 MG tablet Take 1 tablet (10 mg total) by mouth daily. 90 tablet 3   methocarbamol (ROBAXIN) 500 MG tablet Take 1 tablet (500 mg total) by mouth 4 (four) times daily. 16 tablet 0   oxyCODONE-acetaminophen (PERCOCET/ROXICET) 5-325 MG tablet Take 1 tablet by mouth every 6 (six) hours as needed for severe pain. 16 tablet 0   predniSONE (DELTASONE) 10 MG tablet Take 1 tablet (10 mg total) by mouth daily. 42 tablet 0   No facility-administered medications prior to visit.     Allergies:   Allopurinol, Lipitor [atorvastatin calcium], Morphine and related, Simvastatin, and Tape   Social History   Socioeconomic History   Marital status: Widowed    Spouse name: Not on file   Number of children: 2   Years of education: Not on file    Highest education level: High school graduate  Occupational History   Not on file  Tobacco Use   Smoking status: Never   Smokeless tobacco: Never  Vaping Use   Vaping Use: Never used  Substance and Sexual Activity   Alcohol use: Never    Alcohol/week: 0.0 standard drinks   Drug use: Never   Sexual activity: Never    Birth control/protection: Post-menopausal  Other Topics Concern   Not on file  Social History Narrative   Not on file   Social Determinants of Health   Financial Resource Strain: Not on file  Food Insecurity: Not on file  Transportation Needs: Not on file  Physical Activity: Not on file  Stress: Not  on file  Social Connections: Not on file     Family History:  The patient's family history includes Bladder Cancer in her sister; Cancer in her sister; Heart attack in her father; Heart disease in her brother, father, and sister; Leukemia in her brother and brother; Lung cancer in her brother; Stomach cancer in her brother; Stroke in her sister and sister.   ROS:   Please see the history of present illness.    ROS All other systems reviewed and are negative.   PHYSICAL EXAM:   VS:  BP (!) 152/72   Pulse 92   Ht 5\' 2"  (1.575 m)   Wt 201 lb 12.8 oz (91.5 kg)   SpO2 98%   BMI 36.91 kg/m    GENERAL:  Well appearing obese WF in NAD HEENT:  PERRL, EOMI, sclera are clear. Oropharynx is clear. NECK:  No jugular venous distention, carotid upstroke brisk and symmetric, no bruits, no thyromegaly or adenopathy LUNGS:  Clear to auscultation bilaterally CHEST:  Unremarkable HEART:  RRR,  PMI not displaced or sustained,S1 and S2 within normal limits, no S3, no S4: no clicks, no rubs, no murmurs ABD:  Soft, nontender. BS +, no masses or bruits. No hepatomegaly, no splenomegaly EXT:  2 + pulses throughout, tr edema, no cyanosis no clubbing SKIN:  Warm and dry.  No rashes NEURO:  Alert and oriented x 3. Cranial nerves II through XII intact. PSYCH:  Cognitively  intact    Wt Readings from Last 3 Encounters:  02/09/21 201 lb 12.8 oz (91.5 kg)  07/15/20 207 lb (93.9 kg)  05/01/20 209 lb 6.4 oz (95 kg)      Studies/Labs Reviewed:   EKG:  EKG is ordered today. NSR with nonspecific TWA.  I have personally reviewed and interpreted this study.   Recent Labs: 05/01/2020: ALT 16; BUN 27; Creatinine, Ser 1.54; Potassium 4.8; Sodium 138   Lipid Panel    Component Value Date/Time   CHOL 159 05/01/2020 1156   TRIG 196 (H) 05/01/2020 1156   HDL 55 05/01/2020 1156   CHOLHDL 2.9 05/01/2020 1156   CHOLHDL 3 12/11/2018 1001   VLDL 27.2 12/11/2018 1001   LDLCALC 72 05/01/2020 1156   LDLDIRECT 136.0 10/31/2015 0959    Additional studies/ records that were reviewed today include:   Cath 04/06/2017 Conclusion      Mid Cx lesion is 95% stenosed. A drug-eluting stent was successfully placed using a STENT SVELTE RX 3.50 X 28MM. Post intervention, there is a 0% residual stenosis.   Successful angioplasty and drug-eluting stent placement to the mid left circumflex.      Echo 04/05/2017 LV EF: 55% -   60%   Study Conclusions   - Left ventricle: The cavity size was normal. Wall thickness was   increased in a pattern of severe LVH. Systolic function was   normal. The estimated ejection fraction was in the range of 55%   to 60%. Wall motion was normal; there were no regional wall   motion abnormalities. Doppler parameters are consistent with   pseudonormal left ventricular relaxation (grade 2 diastolic   dysfunction). The E/e&' ratio is >15, suggesting elevated LV   filling pressure. - Aortic valve: Sclerosis without stenosis. Transvalvular velocity   was minimally increased. Mean gradient (S): 7 mm Hg. - Mitral valve: Mildly thickened leaflets . There was trivial   regurgitation. - Left atrium: Moderately dilated. - Tricuspid valve: There was mild regurgitation. - Pulmonary arteries: PA peak pressure: 34 mm  Hg (S). - Inferior vena cava: The  vessel was normal in size. The   respirophasic diameter changes were in the normal range (>= 50%),   consistent with normal central venous pressure.   Impressions:   - LVEF 55-60%, severe LVH, normal wall motion, grade 2 DD with   elevated LV filling pressure, aortic valve sclerosis, trivial MR,   moderate LAE, mild TR, RVSP 34 mmHg, normal IVC.    ASSESSMENT:    No diagnosis found.    PLAN:  In order of problems listed above:  CAD: s/p DES of LCx for NSTEMI in Dec. 2018. Continue aspirin,  carvedilol, and Crestor. She is asymptomatic.   Hyperlipidemia: On Crestor 10 mg and tolerating well. Will check fasting lab today.  Hypertension: Blood pressure is satisfactory especially in light of history of orthostasis. Continue low dose Coreg.   4.    CVA: Confirmed on MR post cath.  Asymptomatic.   5.   Hypothyroidism: will check TSH.   6.   History of orthostatic hypotension. Resolved. Now on very low dose Coreg.   7.   HA recommend follow up with Neurology.     Medication Adjustments/Labs and Tests Ordered: Current medicines are reviewed at length with the patient today.  Concerns regarding medicines are outlined above.  Medication changes, Labs and Tests ordered today are listed in the Patient Instructions below. There are no Patient Instructions on file for this visit.    Signed, Surena Welge Martinique, MD  02/09/2021 11:52 AM    [facility] [address], [place], [place]  [address] Phone: [phone]; Fax: [phone]

## 2021-02-09 ENCOUNTER — Ambulatory Visit: Payer: Medicare Other | Admitting: Cardiology

## 2021-02-09 ENCOUNTER — Encounter: Payer: Self-pay | Admitting: Cardiology

## 2021-02-09 ENCOUNTER — Other Ambulatory Visit: Payer: Self-pay

## 2021-02-09 VITALS — BP 152/72 | HR 92 | Ht 62.0 in | Wt 201.8 lb

## 2021-02-09 DIAGNOSIS — E782 Mixed hyperlipidemia: Secondary | ICD-10-CM | POA: Diagnosis not present

## 2021-02-09 DIAGNOSIS — I10 Essential (primary) hypertension: Secondary | ICD-10-CM

## 2021-02-09 DIAGNOSIS — I951 Orthostatic hypotension: Secondary | ICD-10-CM

## 2021-02-09 DIAGNOSIS — I25118 Atherosclerotic heart disease of native coronary artery with other forms of angina pectoris: Secondary | ICD-10-CM

## 2021-02-09 DIAGNOSIS — E039 Hypothyroidism, unspecified: Secondary | ICD-10-CM

## 2021-02-10 ENCOUNTER — Other Ambulatory Visit: Payer: Self-pay

## 2021-02-10 ENCOUNTER — Encounter (HOSPITAL_COMMUNITY): Payer: Self-pay

## 2021-02-10 ENCOUNTER — Telehealth: Payer: Self-pay | Admitting: Physician Assistant

## 2021-02-10 ENCOUNTER — Emergency Department (HOSPITAL_COMMUNITY)
Admission: EM | Admit: 2021-02-10 | Discharge: 2021-02-10 | Disposition: A | Discharge status: Home / Self Care | Payer: Medicare Other | Attending: Emergency Medicine | Admitting: Emergency Medicine

## 2021-02-10 DIAGNOSIS — I2510 Atherosclerotic heart disease of native coronary artery without angina pectoris: Secondary | ICD-10-CM | POA: Diagnosis not present

## 2021-02-10 DIAGNOSIS — R531 Weakness: Secondary | ICD-10-CM | POA: Diagnosis present

## 2021-02-10 DIAGNOSIS — Z79899 Other long term (current) drug therapy: Secondary | ICD-10-CM | POA: Insufficient documentation

## 2021-02-10 DIAGNOSIS — N183 Chronic kidney disease, stage 3 unspecified: Secondary | ICD-10-CM | POA: Insufficient documentation

## 2021-02-10 DIAGNOSIS — Z955 Presence of coronary angioplasty implant and graft: Secondary | ICD-10-CM | POA: Insufficient documentation

## 2021-02-10 DIAGNOSIS — Z7982 Long term (current) use of aspirin: Secondary | ICD-10-CM | POA: Insufficient documentation

## 2021-02-10 DIAGNOSIS — E039 Hypothyroidism, unspecified: Secondary | ICD-10-CM | POA: Insufficient documentation

## 2021-02-10 DIAGNOSIS — I129 Hypertensive chronic kidney disease with stage 1 through stage 4 chronic kidney disease, or unspecified chronic kidney disease: Secondary | ICD-10-CM | POA: Insufficient documentation

## 2021-02-10 DIAGNOSIS — Z96653 Presence of artificial knee joint, bilateral: Secondary | ICD-10-CM | POA: Insufficient documentation

## 2021-02-10 DIAGNOSIS — D649 Anemia, unspecified: Secondary | ICD-10-CM | POA: Diagnosis not present

## 2021-02-10 DIAGNOSIS — I10 Essential (primary) hypertension: Secondary | ICD-10-CM

## 2021-02-10 DIAGNOSIS — D509 Iron deficiency anemia, unspecified: Secondary | ICD-10-CM | POA: Diagnosis not present

## 2021-02-10 LAB — CBC WITH DIFFERENTIAL/PLATELET
Abs Immature Granulocytes: 0.04 10*3/uL (ref 0.00–0.07)
Basophils Absolute: 0.1 10*3/uL (ref 0.0–0.2)
Basophils Absolute: 0.1 10*3/uL (ref 0.0–0.1)
Basophils Relative: 1 %
Basos: 1 %
EOS (ABSOLUTE): 0.2 10*3/uL (ref 0.0–0.4)
Eos: 3 %
Eosinophils Absolute: 0.3 10*3/uL (ref 0.0–0.5)
Eosinophils Relative: 3 %
HCT: 26.1 % — ABNORMAL LOW (ref 36.0–46.0)
Hematocrit: 23.8 % — ABNORMAL LOW (ref 34.0–46.6)
Hemoglobin: 6.8 g/dL — CL (ref 11.1–15.9)
Hemoglobin: 7.1 g/dL — ABNORMAL LOW (ref 12.0–15.0)
Immature Grans (Abs): 0 10*3/uL (ref 0.0–0.1)
Immature Granulocytes: 0 %
Immature Granulocytes: 1 %
Lymphocytes Absolute: 2.1 10*3/uL (ref 0.7–3.1)
Lymphocytes Relative: 22 %
Lymphs Abs: 1.8 10*3/uL (ref 0.7–4.0)
Lymphs: 24 %
MCH: 21.2 pg — ABNORMAL LOW (ref 26.6–33.0)
MCH: 21.3 pg — ABNORMAL LOW (ref 26.0–34.0)
MCHC: 28.6 g/dL — ABNORMAL LOW (ref 31.5–35.7)
MCHC: 27.2 g/dL — ABNORMAL LOW (ref 30.0–36.0)
MCV: 74 fL — ABNORMAL LOW (ref 79–97)
MCV: 78.4 fL — ABNORMAL LOW (ref 80.0–100.0)
Monocytes Absolute: 0.8 10*3/uL (ref 0.1–0.9)
Monocytes Absolute: 0.8 10*3/uL (ref 0.1–1.0)
Monocytes Relative: 9 %
Monocytes: 9 %
Neutro Abs: 5.5 10*3/uL (ref 1.7–7.7)
Neutrophils Absolute: 5.5 10*3/uL (ref 1.4–7.0)
Neutrophils Relative %: 64 %
Neutrophils: 63 %
Platelets: 217 10*3/uL (ref 150–450)
Platelets: 221 10*3/uL (ref 150–400)
RBC: 3.21 x10E6/uL — ABNORMAL LOW (ref 3.77–5.28)
RBC: 3.33 MIL/uL — ABNORMAL LOW (ref 3.87–5.11)
RDW: 19.3 % — ABNORMAL HIGH (ref 11.7–15.4)
RDW: 21.4 % — ABNORMAL HIGH (ref 11.5–15.5)
WBC: 8.8 10*3/uL (ref 3.4–10.8)
WBC: 8.5 10*3/uL (ref 4.0–10.5)
nRBC: 0.2 % (ref 0.0–0.2)

## 2021-02-10 LAB — FOLATE: Folate: 16.4 ng/mL (ref >5.9)

## 2021-02-10 LAB — BASIC METABOLIC PANEL
Anion gap: 10 (ref 5–15)
BUN/Creatinine Ratio: 16 (ref 12–28)
BUN: 27 mg/dL (ref 8–27)
BUN: 24 mg/dL — ABNORMAL HIGH (ref 8–23)
CO2: 22 mmol/L (ref 20–29)
CO2: 24 mmol/L (ref 22–32)
Calcium: 9 mg/dL (ref 8.7–10.3)
Calcium: 9.1 mg/dL (ref 8.9–10.3)
Chloride: 105 mmol/L (ref 96–106)
Chloride: 105 mmol/L (ref 98–111)
Creatinine, Ser: 1.72 mg/dL — ABNORMAL HIGH (ref 0.57–1.00)
Creatinine, Ser: 1.45 mg/dL — ABNORMAL HIGH (ref 0.44–1.00)
GFR, Estimated: 35 mL/min — ABNORMAL LOW (ref >60)
Glucose, Bld: 163 mg/dL — ABNORMAL HIGH (ref 70–99)
Glucose: 164 mg/dL — ABNORMAL HIGH (ref 70–99)
Potassium: 4.6 mmol/L (ref 3.5–5.2)
Potassium: 4.2 mmol/L (ref 3.5–5.1)
Sodium: 140 mmol/L (ref 134–144)
Sodium: 139 mmol/L (ref 135–145)
eGFR: 29 mL/min/{1.73_m2} — ABNORMAL LOW (ref >59)

## 2021-02-10 LAB — VITAMIN B12: Vitamin B-12: 2060 pg/mL — ABNORMAL HIGH (ref 180–914)

## 2021-02-10 LAB — HEPATIC FUNCTION PANEL
ALT: 8 IU/L (ref 0–32)
AST: 15 IU/L (ref 0–40)
Albumin: 4.2 g/dL (ref 3.6–4.6)
Alkaline Phosphatase: 142 IU/L — ABNORMAL HIGH (ref 44–121)
Bilirubin Total: 0.3 mg/dL (ref 0.0–1.2)
Bilirubin, Direct: <0.1 mg/dL (ref 0.00–0.40)
Total Protein: 6.8 g/dL (ref 6.0–8.5)

## 2021-02-10 LAB — RETICULOCYTES
Immature Retic Fract: 42.8 % — ABNORMAL HIGH (ref 2.3–15.9)
RBC.: 3.26 MIL/uL — ABNORMAL LOW (ref 3.87–5.11)
Retic Count, Absolute: 133 10*3/uL (ref 19.0–186.0)
Retic Ct Pct: 4.1 % — ABNORMAL HIGH (ref 0.4–3.1)

## 2021-02-10 LAB — PREPARE RBC (CROSSMATCH)

## 2021-02-10 LAB — TSH: TSH: 1.99 u[IU]/mL (ref 0.450–4.500)

## 2021-02-10 LAB — LIPID PANEL
Chol/HDL Ratio: 3.9 ratio (ref 0.0–4.4)
Cholesterol, Total: 206 mg/dL — ABNORMAL HIGH (ref 100–199)
HDL: 53 mg/dL (ref >39)
LDL Chol Calc (NIH): 130 mg/dL — ABNORMAL HIGH (ref 0–99)
Triglycerides: 131 mg/dL (ref 0–149)
VLDL Cholesterol Cal: 23 mg/dL (ref 5–40)

## 2021-02-10 LAB — FERRITIN: Ferritin: 6 ng/mL — ABNORMAL LOW (ref 11–307)

## 2021-02-10 LAB — IRON AND TIBC
Iron: 22 ug/dL — ABNORMAL LOW (ref 28–170)
Saturation Ratios: 5 % — ABNORMAL LOW (ref 10.4–31.8)
TIBC: 436 ug/dL (ref 250–450)
UIBC: 414 ug/dL

## 2021-02-10 MED ORDER — LABETALOL HCL 5 MG/ML IV SOLN
20.0000 mg | Freq: Once | INTRAVENOUS | Status: AC
  Administered 2021-02-10: 20 mg via INTRAVENOUS
  Filled 2021-02-10: qty 4

## 2021-02-10 MED ORDER — SODIUM CHLORIDE 0.9 % IV SOLN
10.0000 mL/h | Freq: Once | INTRAVENOUS | Status: AC
  Administered 2021-02-10: 10 mL/h via INTRAVENOUS

## 2021-02-10 MED ORDER — ACETAMINOPHEN 325 MG PO TABS
650.0000 mg | ORAL_TABLET | Freq: Once | ORAL | Status: AC
  Administered 2021-02-10: 650 mg via ORAL
  Filled 2021-02-10: qty 2

## 2021-02-10 MED ORDER — CARVEDILOL 3.125 MG PO TABS
6.2500 mg | ORAL_TABLET | Freq: Once | ORAL | Status: AC
  Administered 2021-02-10: 6.25 mg via ORAL
  Filled 2021-02-10: qty 2

## 2021-02-10 MED ORDER — CENTRUM PO CHEW: 1.0000 | CHEWABLE_TABLET | Freq: Every day | ORAL | 0 refills | Status: DC

## 2021-02-10 NOTE — ED Provider Notes (Signed)
Care of the patient assumed at the change of shift pending blood transfusion. Patient hypertensive while in the ED but improved with meds. No signs of end organ damage and she is not symptomatic. Advised to continue her meds and follow up with PCP for recheck. She is comfortable with that plan. Daughter at bedside in agreement.    Truddie Hidden, MD 02/10/21 2038

## 2021-02-10 NOTE — Discharge Instructions (Addendum)
As discussed, today's finding has been consistent with anemia, possibly due to slow production of new red blood cells.  It is important you follow-up with your physician as an outpatient.  Please discuss today's emergency department evaluation, your transfusion, and your lab tests that were sent from this visit.  Return here for concerning changes in your condition.

## 2021-02-10 NOTE — ED Provider Notes (Addendum)
[place] DEPT Provider Note   CSN: [id number] Arrival date & time: 02/10/21  5638     History Chief Complaint  Patient presents with   Abnormal Lab   Dizziness    Valerie Ramirez is a 87 y.o. female.  HPI Patient presents at the behest of her 75 office after she was found to have abnormal labs from a visit yesterday.  She went there yesterday due to weakness, dizziness.  She notes that she has had this for months, possibly 6.  Symptoms have maybe become worse over the past few weeks.  No fall, complete syncope, focal pain, exertional discomfort.  However with additional activity she has increased weakness.  She denies hematemesis, melena, bright red blood per rectum, abdominal pain as well.  She is not on a blood thinning medication.    Past Medical History:  Diagnosis Date   Arthritis    OA   CAD (coronary artery disease)    stent December 2018   Cancer [facility])    endometrial   GERD (gastroesophageal reflux disease)    Hearing loss    History of kidney stones    Hyperlipidemia    Hypertension    Hypothyroid    "not in years"   Shortness of breath dyspnea    04/25/17- not anymore    Patient Active Problem List   Diagnosis Date Noted   UTI (urinary tract infection) 03/25/2020   History of CVA (cerebrovascular accident) 02/14/2020   Headache 02/14/2020   Diarrhea 12/26/2017   Dizziness 12/26/2017   Depression 10/26/2017   H/O subdural hemorrhage 09/18/2017   CAD S/P percutaneous coronary angioplasty 09/18/2017   Closed fracture of nasal bones    Epistaxis    Elevated transaminase level 08/03/2017   Radial artery injury, left, sequela 06/15/2017   H/O: CVA (cerebrovascular accident) 05/26/2017   Sick sinus syndrome ([place]) 05/25/2017   Exertional chest pain 04/05/2017   Chronic renal disease, stage III ([place]) 04/05/2017   Anemia 04/05/2017   Thoracic back pain 09/15/2016   History of radius fracture 06/01/2016   Status post  cervical spinal fusion 08/18/2015   Osteoarthritis, hip, bilateral 08/12/2014   Hearing loss in right ear 06/12/2014   Colon cancer screening 08/10/2013   Seborrheic keratoses, inflamed 08/10/2013   History of fall 04/10/2013   Encounter for Medicare annual wellness exam 08/08/2012   Gout 07/14/2012   History of endometrial cancer 05/25/2012   Degenerative joint disease of cervical spine 05/03/2011   Other screening mammogram 03/31/2011   Prediabetes 09/26/2007   Obesity (BMI 30-39.9) 07/12/2007   Essential hypertension 07/11/2007   History of cardiomyopathy 07/11/2007   Osteoarthritis 07/11/2007   MIXED INCONTINENCE URGE AND STRESS 07/11/2007   Hypothyroidism 10/05/2006   HYPERCHOLESTEROLEMIA, PURE 10/05/2006    Past Surgical History:  Procedure Laterality Date   ABDOMINAL HYSTERECTOMY     ANTERIOR CERVICAL DECOMP/DISCECTOMY FUSION [name] 08/18/2015   Procedure: C5-6, C6-7 Anterior Cervical Discectomy and Fusion, Allograft, Plate;  Surgeon: Marybelle Killings, MD;  Location: [place];  Service: Orthopedics;  Laterality: [name];   bone spur removed Right    shoulder   BOWEL RESECTION  07/04/2012   Procedure: SMALL BOWEL RESECTION;  Surgeon: Alvino Chapel, MD;  [facility];  Service: Gynecology;;   BREAST BIOPSY     CHOLECYSTECTOMY     CORONARY STENT INTERVENTION [name] 04/06/2017   Procedure: CORONARY STENT INTERVENTION;  Surgeon: Wellington Hampshire, MD;  Location: [place] [facility];  Service: Cardiovascular;  Laterality: [name];   DILATION AND CURETTAGE OF UTERUS  1999   EYE SURGERY Bilateral    cataracts   FALSE ANEURYSM REPAIR Right 04/26/2017   Procedure: 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;  Surgeon: Conrad [place], MD;  Location: [place];  Service: Vascular;  Laterality: Right;   HAND SURGERY  12/2008   after hand fx/due to fall   JOINT REPLACEMENT Bilateral 07/1998   knees   KNEE ARTHROPLASTY  05/23/1998   total right   LAPAROTOMY Bilateral 07/04/2012   Procedure:  EXPLORATORY LAPAROTOMY TOTAL ABDOMINAL HYSTERECTOMY BILATERAL SALPINGO-OOPHORECTOMY with small bowel resection ;  Surgeon: Alvino Chapel, MD;  [facility];  Service: Gynecology;  Laterality: Bilateral;   LEFT HEART CATH AND CORONARY ANGIOGRAPHY [name] 04/06/2017   Procedure: LEFT HEART CATH AND CORONARY ANGIOGRAPHY;  Surgeon: Jolaine Artist, MD;  Location: [place] [facility];  Service: Cardiovascular;  Laterality: [name];   polyp removal  1999   TUBAL LIGATION       OB History     Gravida  5   Para  2   Term      Preterm      AB  3   Living  2      SAB  3   IAB      Ectopic      Multiple      Live Births              Family History  Problem Relation Age of Onset   Heart disease Father    Heart attack Father    Leukemia Brother    Lung cancer Brother    Leukemia Brother    Stomach cancer Brother    Heart disease Sister        CABG   Stroke Sister    Cancer Sister    Bladder Cancer Sister    Heart disease Brother        CABG   Stroke Sister        "massive brain bleed"    Social History   Tobacco Use   Smoking status: Never   Smokeless tobacco: Never  Vaping Use   Vaping Use: Never used  Substance Use Topics   Alcohol use: Never    Alcohol/week: 0.0 standard drinks   Drug use: Never    Home Medications Prior to Admission medications   Medication Sig Start Date End Date Taking? Authorizing Provider  acetaminophen (TYLENOL) 650 MG CR tablet Take 650 mg by mouth every 8 (eight) hours as needed for pain.    [provider]  aspirin EC 81 MG tablet Take 1 tablet (81 mg total) by mouth daily. 04/06/18   Erlene Quan, PA-C  carvedilol (COREG) 3.125 MG tablet Take 1 tablet (3.125 mg total) by mouth 2 (two) times daily. 05/01/20 04/26/21  Martinique, Peter M, MD  Cholecalciferol (VITAMIN D PO) Take by mouth.    [provider]  Colchicine 0.6 MG CAPS Take 1 capsule by mouth in the morning and at bedtime. 08/28/20   Tower,  Wynelle Fanny, MD  DULoxetine (CYMBALTA) 30 MG capsule Take 1 capsule (30 mg total) by mouth daily. 02/14/20   Tower, Wynelle Fanny, MD  fluticasone (FLONASE) 50 MCG/ACT nasal spray Place 2 sprays into both nostrils daily. 02/14/20   Tower, Wynelle Fanny, MD  nitroGLYCERIN (NITROSTAT) 0.4 MG SL tablet Place 1 tablet (0.4 mg total) under the tongue every 5 (five) minutes x 3 doses as needed  for chest pain. 04/09/17   [name], [name], PA-C  rosuvastatin (CRESTOR) 10 MG tablet Take 1 tablet (10 mg total) by mouth daily. 05/01/20 04/26/21  Martinique, Peter M, MD    Allergies    Allopurinol, Lipitor [atorvastatin calcium], Morphine and related, Simvastatin, and Tape  Review of Systems   Review of Systems  Constitutional:        Per HPI, otherwise negative  HENT:         Per HPI, otherwise negative  Respiratory:         Per HPI, otherwise negative  Cardiovascular:        Per HPI, otherwise negative  Gastrointestinal:  Negative for abdominal pain, blood in stool, nausea and vomiting.  Endocrine:       Negative aside from HPI  Genitourinary:        Neg aside from HPI   Musculoskeletal:        Per HPI, otherwise negative  Skin: Negative.   Neurological:  Positive for dizziness and weakness. Negative for syncope.   Physical Exam Updated Vital Signs BP (!) 190/90 (BP Location: Left Arm)   Pulse 84   Temp 98.5 F (36.9 C) (Oral)   Resp 18   Ht 5\' 2"  (1.575 m)   Wt 91.5 kg   SpO2 95%   BMI 36.91 kg/m   Physical Exam Vitals and nursing note reviewed.  Constitutional:      General: She is not in acute distress.    Appearance: She is well-developed. She is obese.  HENT:     Head: Normocephalic and atraumatic.  Eyes:     Conjunctiva/sclera: Conjunctivae normal.  Cardiovascular:     Rate and Rhythm: Normal rate and regular rhythm.  Pulmonary:     Effort: Pulmonary effort is normal. No respiratory distress.     Breath sounds: Normal breath sounds. No stridor.  Abdominal:     General: There is no  distension.     Palpations: There is no mass.     Tenderness: There is no abdominal tenderness. There is no guarding.  Skin:    General: Skin is warm and dry.  Neurological:     Mental Status: She is alert and oriented to person, place, and time.     Cranial Nerves: No cranial nerve deficit.    ED Results / Procedures / Treatments   Labs (all labs ordered are listed, but only abnormal results are displayed) Labs Reviewed  CBC WITH DIFFERENTIAL/PLATELET - Abnormal; Notable for the following components:      Result Value   RBC 3.33 (*)    Hemoglobin 7.1 (*)    HCT 26.1 (*)    MCV 78.4 (*)    MCH 21.3 (*)    MCHC 27.2 (*)    RDW 21.4 (*)    All other components within normal limits  BASIC METABOLIC PANEL - Abnormal; Notable for the following components:   Glucose, Bld 163 (*)    BUN 24 (*)    Creatinine, Ser 1.45 (*)    GFR, Estimated 35 (*)    All other components within normal limits  VITAMIN B12  FOLATE  IRON AND TIBC  FERRITIN  RETICULOCYTES  POC OCCULT BLOOD, ED  TYPE AND SCREEN  PREPARE RBC (CROSSMATCH)    EKG None  Radiology No results found.  Procedures Procedures   Medications Ordered in ED Medications  0.9 %  sodium chloride infusion (has no administration in time range)    ED Course  I  have reviewed the triage vital signs and the nursing notes.  Pertinent labs & imaging results that were available during my care of the patient were reviewed by me and considered in my medical decision making (see chart for details).  Labs reviewed, notable for hemoglobin 6.1, similar to outpatient value 6.8 from yesterday.  With critical abnormalities, symptomatic anemia we discussed indication for transfusion.  In addition, anemia panel recent, though initial findings are most consistent with microcytic anemia.  Patient amenable to transfusion, monitoring, management.  On monitor patient has cardiac 80s sinus normal Pulse ox 96% room air normal  3:40 PM Patient  remains in no distress.  She does have hypertension.  She has not taken her blood pressure medication today, will be provided a dose of Coreg.  She is awaiting blood transfusion, has provided consent.  Dr. Karle Starch is aware of the patient.   MDM Rules/Calculators/A&P Elderly female presents with weakness, dizziness.  She is awake, alert, and at rest is in no distress.  She does have mild hypertension, vital signs are otherwise generally reassuring. Patient's labs consistent with critically abnormal hemoglobin value with microcytic features.  Patient's labs otherwise reassuring. Patient has no obvious active source of bleeding, with labs suggesting microcytic processes, she will start appropriate supplementation, anemia panel sent, patient received transfusion, and pending this may be appropriate for discharge with close outpatient follow-up to follow results, and for ongoing monitoring, management  MDM Number of Diagnoses or Management Options Symptomatic anemia: new, needed workup   Amount and/or Complexity of Data Reviewed Clinical lab tests: ordered and reviewed Tests in the medicine section of CPT: reviewed and ordered Decide to obtain previous medical records or to obtain history from someone other than the patient: yes Obtain history from someone other than the patient: yes Review and summarize past medical records: yes Discuss the patient with other providers: yes Independent visualization of images, tracings, or specimens: yes  Risk of Complications, Morbidity, and/or Mortality Presenting problems: high Diagnostic procedures: high Management options: high  Critical Care Total time providing critical care: 30-74 minutes (35)  Patient Progress Patient progress: stable   Final Clinical Impression(s) / ED Diagnoses Final diagnoses:  Symptomatic anemia    Rx / DC Orders ED Discharge Orders          Ordered    multivitamin-iron-minerals-folic acid (CENTRUM) chewable  tablet  Daily        02/10/21 1406             Carmin Muskrat, MD 02/10/21 1407    Carmin Muskrat, MD 02/10/21 1653

## 2021-02-10 NOTE — ED Provider Notes (Signed)
Emergency Medicine Provider Triage Evaluation Note  Valerie Ramirez , a 87 y.o. female  was evaluated in triage.  Pt complains of generalized weakness and dyspnea on exertion has been chronic and ongoing.  She saw her cardiologist yesterday and had some routine blood work drawn and found her hemoglobin was 6.8.  She was sent to the emergency department for further evaluation and possible blood transfusion.  She denies any bloody bowel movements, abdominal pain, fevers, weight loss.  Review of Systems  Positive:  Negative: See above   Physical Exam  BP (!) 167/92 (BP Location: Right Arm)   Pulse 96   Temp 98.5 F (36.9 C) (Oral)   Resp 20   Ht 5\' 2"  (1.575 m)   Wt 91.5 kg   SpO2 96%   BMI 36.91 kg/m  Gen:   Awake, no distress   Resp:  Normal effort  MSK:   Moves extremities without difficulty  Other:  Pale appearing  Medical Decision Making  Medically screening exam initiated at 10:21 AM.  Appropriate orders placed.  Valerie Ramirez was informed that the remainder of the evaluation will be completed by another provider, this initial triage assessment does not replace that evaluation, and the importance of remaining in the ED until their evaluation is complete.     Myna Bright [place], PA-C 02/10/21 1026    Sherwood Gambler, MD 02/10/21 1159

## 2021-02-10 NOTE — ED Notes (Signed)
MD is aware of blood pressure being elevated. Medications given.

## 2021-02-10 NOTE — ED Triage Notes (Signed)
Patient states she was called this AM and told to come to the ED to get a blood transfusion. Hgb-6.8.  Patient denies any obvious bleeding.

## 2021-02-10 NOTE — Telephone Encounter (Signed)
Sign out given by fellow that patient's Hgb came back at 6.8. Fellow did not called the patient overnight. I have called the patient this morning and let her know that her Hgb is 6.8 and would recommended to come to ER for recheck and work up. She likely needs blood transfusion.  She denies any bleeding issue. Spoke with daughter as well and she will bring patient to the ER.

## 2021-02-11 LAB — BPAM RBC
Blood Product Expiration Date: 202211192359
ISSUE DATE / TIME: 202210251706
Unit Type and Rh: 600

## 2021-02-11 LAB — TYPE AND SCREEN
ABO/RH(D): A NEG
Antibody Screen: NEGATIVE
Unit division: 0

## 2021-02-13 ENCOUNTER — Telehealth: Payer: Self-pay

## 2021-02-13 NOTE — Telephone Encounter (Signed)
Noted will see on Monday. However, if dizziness worsening over the weekend or becoming tachycardic/hypotensive she may need to return to the ER.

## 2021-02-13 NOTE — Telephone Encounter (Signed)
I spoke with [name]; pt having dizziness and fatigue; pt was seen at [facility] ED on 02/10/21; pt had blood transfusion on 02/11/21. [name] has not spoken with pt so far this morning and [name] will ck pts BP and P also this morning and cb if too high or low. [name] said would like appt for Monday 02/16/21. Dr Glori Bickers is out of office until 02/17/21. [name] scheduled appt with Dr Einar Pheasant on 02/16/21 at 11:20; [facility] too far to go. UC & ED precautions given and [name] voiced understanding. [name] said no bleeding has been noted and no abd pain. Sending note to Dr Glori Bickers who is out of office,Dr Einar Pheasant who is out of office and Dr Silvio Pate who is in office and [place] CMA.

## 2021-02-13 NOTE — Telephone Encounter (Signed)
[facility] - Client TELEPHONE ADVICE RECORD [facility] Patient Name: Valerie Ramirez NSON Gender: Female DOB: Mar 05, 1933 Age: 87 Y 65 M 6 D Return Phone Number: [phone] (Primary), [phone] (Secondary) Address: [place] [address] Client [facility] - Client Client Site [place] MD Contact Type Call Who Is Calling Patient / Member / Family / Caregiver Call Type Triage / Clinical Caller Name [name] Relationship To Patient Daughter Return Phone Number [phone] (Secondary) Chief Complaint Dizziness Reason for Call Symptomatic / Request for Health Information Initial Comment Caller states blood transfusion this past Tuesday, dizzy and tired. Translation No Nurse Assessment Nurse: Hardin Negus, RN, Mardene Celeste Date/Time Eilene Ghazi Time): 02/12/2021 5:03:06 PM Confirm and document reason for call. If symptomatic, describe symptoms. ---She had a blood transfusion this past Tuesday, dizzy and tired. She has been drinking her water. no headache Does the patient have any new or worsening symptoms? ---Yes Will a triage be completed? ---Yes Related visit to physician within the last 2 weeks? ---No Does the PT have any chronic conditions? (i.e. diabetes, asthma, this includes High risk factors for pregnancy, etc.) ---Yes List chronic conditions. ---cardiac issues, stent, knee replacement. anemia Is this a behavioral health or substance abuse call? ---No Guidelines Guideline Title Affirmed Question Affirmed Notes Nurse Date/Time (Eastern Time) Dizziness - Lightheadedness [1] MODERATE dizziness (e.g., interferes with normal activities) AND [2] has NOT been evaluated by physician for this (Exception: dizziness caused by heat exposure, sudden Oren Bracket 02/12/2021 5:05:01 PM PLEASE NOTE: All timestamps contained within this report are  represented as [place] Standard Time. CONFIDENTIALTY NOTICE: This fax transmission is intended only for the addressee. It contains information that is legally privileged, confidential or otherwise protected from use or disclosure. If you are not the intended recipient, you are strictly prohibited from reviewing, disclosing, copying using or disseminating any of this information or taking any action in reliance on or regarding this information. If you have received this fax in error, please notify us immediately by telephone so that we can arrange for its return to [place]. Phone: [phone], Toll-Free: [phone], Fax: [phone] Page: 2 of 2 Call Id: [id number] Guidelines Guideline Title Affirmed Question Affirmed Notes Nurse Date/Time Eilene Ghazi Time) standing, or poor fluid intake) Disp. Time Eilene Ghazi Time) Disposition Final User 02/12/2021 5:08:44 PM See PCP within 24 Hours Yes Hardin Negus, RN, [place] Disagree/Comply Comply Caller Understands Yes PreDisposition Call Doctor Care Advice Given Per Guideline SEE PCP WITHIN 24 HOURS: DRINK FLUIDS: CALL BACK IF: * Passes out (faints) * You become worse CARE ADVICE given per Dizziness (Adult) guideline. LIE DOWN AND REST: Referrals REFERRED TO PCP OFFIC

## 2021-02-16 ENCOUNTER — Ambulatory Visit (INDEPENDENT_AMBULATORY_CARE_PROVIDER_SITE_OTHER): Payer: Medicare Other | Admitting: Family Medicine

## 2021-02-16 ENCOUNTER — Other Ambulatory Visit: Payer: Self-pay

## 2021-02-16 VITALS — BP 122/80 | HR 74 | Temp 97.0°F | Ht 62.0 in | Wt 201.5 lb

## 2021-02-16 DIAGNOSIS — I2510 Atherosclerotic heart disease of native coronary artery without angina pectoris: Secondary | ICD-10-CM

## 2021-02-16 DIAGNOSIS — D509 Iron deficiency anemia, unspecified: Secondary | ICD-10-CM

## 2021-02-16 DIAGNOSIS — I10 Essential (primary) hypertension: Secondary | ICD-10-CM

## 2021-02-16 DIAGNOSIS — Z23 Encounter for immunization: Secondary | ICD-10-CM

## 2021-02-16 DIAGNOSIS — Z9861 Coronary angioplasty status: Secondary | ICD-10-CM

## 2021-02-16 LAB — CBC WITH DIFFERENTIAL/PLATELET
Basophils Absolute: 0.1 10*3/uL (ref 0.0–0.1)
Basophils Relative: 1 % (ref 0.0–3.0)
Eosinophils Absolute: 0.2 10*3/uL (ref 0.0–0.7)
Eosinophils Relative: 3 % (ref 0.0–5.0)
HCT: 27.2 % — ABNORMAL LOW (ref 36.0–46.0)
Hemoglobin: 8.4 g/dL — ABNORMAL LOW (ref 12.0–15.0)
Lymphocytes Relative: 24.5 % (ref 12.0–46.0)
Lymphs Abs: 1.8 10*3/uL (ref 0.7–4.0)
MCHC: 30.9 g/dL (ref 30.0–36.0)
MCV: 74.6 fl — ABNORMAL LOW (ref 78.0–100.0)
Monocytes Absolute: 0.7 10*3/uL (ref 0.1–1.0)
Monocytes Relative: 9.4 % (ref 3.0–12.0)
Neutro Abs: 4.6 10*3/uL (ref 1.4–7.7)
Neutrophils Relative %: 62.1 % (ref 43.0–77.0)
Platelets: 187 10*3/uL (ref 150.0–400.0)
RBC: 3.65 Mil/uL — ABNORMAL LOW (ref 3.87–5.11)
RDW: 25.1 % — ABNORMAL HIGH (ref 11.5–15.5)
WBC: 7.4 10*3/uL (ref 4.0–10.5)

## 2021-02-16 MED ORDER — IRON (FERROUS SULFATE) 325 (65 FE) MG PO TABS: 325.0000 mg | ORAL_TABLET | Freq: Every day | ORAL | 1 refills | Status: DC

## 2021-02-16 NOTE — Progress Notes (Signed)
Subjective:     Valerie Ramirez is a 87 y.o. female presenting for Fatigue (Got blood transfusion on Tuesday and felt better for one day. Thursday her fatigue, headaches and dizziness returned. ), Dizziness, and Headache     Dizziness Associated symptoms include headaches.  Headache  Associated symptoms include dizziness.   #Dizziness - continues to feel tired about 50% of the times - feels "drunk" or dizziness - symptoms occur when getting up and walking around - improves if she sits down - if she does not sit will fall down - getting bad headaches - drinking - water "all the time" - 16 oz glass and drinks 6-7 of these - no blood in stool or dark stools - no blood in urine - does bruise easily - no skin changes - did feel well for 24 hours after blood transfusion but symptom returned almost immediately   Review of Systems  Neurological:  Positive for dizziness and headaches.   02/10/2021: ER - symptomatic anemia - transfusion. Given coreg.  02/09/2021: Cardiology - H/H low recommended ER  Iron deficiency  Anemia - 12.2/38 in 11/2019  Social History   Tobacco Use  Smoking Status Never  Smokeless Tobacco Never        Objective:    BP Readings from Last 3 Encounters:  02/16/21 122/80  02/10/21 (!) 170/72  02/09/21 (!) 152/72   Wt Readings from Last 3 Encounters:  02/16/21 201 lb 8 oz (91.4 kg)  02/10/21 201 lb 12.8 oz (91.5 kg)  02/09/21 201 lb 12.8 oz (91.5 kg)    BP 122/80   Pulse 74   Temp (!) 97 F (36.1 C) (Temporal)   Ht 5\' 2"  (1.575 m)   Wt 201 lb 8 oz (91.4 kg)   SpO2 97%   BMI 36.85 kg/m    Physical Exam Constitutional:      General: She is not in acute distress.    Appearance: She is well-developed. She is not diaphoretic.  HENT:     Right Ear: External ear normal.     Left Ear: External ear normal.     Nose: Nose normal.     Mouth/Throat:     Mouth: Mucous membranes are moist.  Eyes:     Conjunctiva/sclera: Conjunctivae  normal.     Comments: Conjunctival pallor b/l  Cardiovascular:     Rate and Rhythm: Normal rate and regular rhythm.     Heart sounds: Murmur heard.  Pulmonary:     Effort: Pulmonary effort is normal. No respiratory distress.     Breath sounds: Normal breath sounds. No wheezing.  Musculoskeletal:     Cervical back: Neck supple.  Skin:    General: Skin is warm and dry.     Capillary Refill: Capillary refill takes less than 2 seconds.  Neurological:     Mental Status: She is alert. Mental status is at baseline.  Psychiatric:        Mood and Affect: Mood normal.        Behavior: Behavior normal.          Assessment & Plan:   Problem List Items Addressed This Visit       Cardiovascular and Mediastinum   Essential hypertension   CAD S/P percutaneous coronary angioplasty    Discussed given heart hx that if anemia is worse today she may need to return to ER for transfusion due to risk for MI        Other   Anemia -  Primary    S/p transfusion on 02/10/2021 with return of dizziness. Of note, last H/H 12.2/38 in August 2021. No bleeding but ER work-up suggests iron deficiency. Start oral iron. Discussed consideration for GI work-up will start with FOBT. Repeat labs today - though she is hemodynamically stable. If cbc does not show appropriate response anticipate hematology consult to help manage/consider repeat transfusion vs iron infusion. Discussed potential for procedure with GI - she seemed open to considering so will refer if FOBT positive or if inadequate response to iron.       Relevant Medications   Iron, Ferrous Sulfate, 325 (65 Fe) MG TABS   Other Relevant Orders   Fecal occult blood, imunochemical   CBC with Differential   Other Visit Diagnoses     Need for influenza vaccination       Relevant Orders   Flu Vaccine QUAD High Dose(Fluad) (Completed)        Return in about 4 weeks (around 03/16/2021) for with Dr. Glori Bickers.  Lesleigh Noe, MD  This visit  occurred during the SARS-CoV-2 public health emergency.  Safety protocols were in place, including screening questions prior to the visit, additional usage of staff PPE, and extensive cleaning of exam room while observing appropriate contact time as indicated for disinfecting solutions.

## 2021-02-16 NOTE — Assessment & Plan Note (Signed)
Discussed given heart hx that if anemia is worse today she may need to return to ER for transfusion due to risk for MI

## 2021-02-16 NOTE — Patient Instructions (Addendum)
Anemia - start iron pill  - return to see Dr. Glori Bickers in 4 weeks  Call back or come in sooner if:  1) You start noticing bleeding 2) Fast heart rate, shortness of breath, worsening dizziness, chest pain  Labs today - if stable -- plan for oral replacement and monitoring syptoms - if severely worse - may need to consider ER - if just not improved - then likely hematology follow-up  Bring back stool test

## 2021-02-16 NOTE — Assessment & Plan Note (Signed)
S/p transfusion on 02/10/2021 with return of dizziness. Of note, last H/H 12.2/38 in August 2021. No bleeding but ER work-up suggests iron deficiency. Start oral iron. Discussed consideration for GI work-up will start with FOBT. Repeat labs today - though she is hemodynamically stable. If cbc does not show appropriate response anticipate hematology consult to help manage/consider repeat transfusion vs iron infusion. Discussed potential for procedure with GI - she seemed open to considering so will refer if FOBT positive or if inadequate response to iron.

## 2021-02-20 ENCOUNTER — Other Ambulatory Visit (INDEPENDENT_AMBULATORY_CARE_PROVIDER_SITE_OTHER): Payer: Medicare Other

## 2021-02-20 ENCOUNTER — Telehealth: Payer: Self-pay | Admitting: Radiology

## 2021-02-20 DIAGNOSIS — D509 Iron deficiency anemia, unspecified: Secondary | ICD-10-CM | POA: Diagnosis not present

## 2021-02-20 DIAGNOSIS — D500 Iron deficiency anemia secondary to blood loss (chronic): Secondary | ICD-10-CM

## 2021-02-20 DIAGNOSIS — R195 Other fecal abnormalities: Secondary | ICD-10-CM

## 2021-02-20 LAB — FECAL OCCULT BLOOD, IMMUNOCHEMICAL: Fecal Occult Bld: POSITIVE — AB

## 2021-02-20 NOTE — Telephone Encounter (Signed)
[facility] called a positive ifob, results given to Dr Damita Dunnings.

## 2021-02-20 NOTE — Telephone Encounter (Signed)
Ordered by Dr. Einar Pheasant, PCP is Dr. Glori Bickers.  I routed to both.  It should be okay for either to address.

## 2021-02-20 NOTE — Telephone Encounter (Signed)
Patient with recent transfusion for symptomatic anemia.   Please call patient and relay positive blood in stool. During our visit we discussed GI referral if positive - please see if she would like to see GI now or wait until a month of iron supplementation.

## 2021-02-22 ENCOUNTER — Other Ambulatory Visit: Payer: Self-pay | Admitting: Family Medicine

## 2021-02-22 NOTE — Telephone Encounter (Signed)
Let me know if agreeable and I will put the referral in

## 2021-02-23 NOTE — Telephone Encounter (Signed)
Left message to return call to our office.  

## 2021-02-24 DIAGNOSIS — R195 Other fecal abnormalities: Secondary | ICD-10-CM | POA: Insufficient documentation

## 2021-02-24 NOTE — Telephone Encounter (Signed)
I placed the referral specifying Dr Paulita Fujita and specified urgent  Unsure how long this will take  Pt may want to go ahead and call office to get appt  Thanks and keep me posted

## 2021-02-24 NOTE — Telephone Encounter (Signed)
Tried calling the patient and he did not answer. LVM for patient to call back.   

## 2021-02-24 NOTE — Telephone Encounter (Signed)
I spoke with Valerie Ramirez (DPR signed) and Valerie Ramirez does want GI referral to see where blood is coming from and pt is very tired. Valerie Ramirez would like referral by Dr Arta Silence on [facility] in [place]. Valerie Ramirez request cb when referral done. Valerie Ramirez can be reached at [phone]. Valerie Ramirez said pt said that she has not seen any blood. Valerie Ramirez will cb with any questions or concerns. Sending note to Dr Glori Bickers.

## 2021-02-24 NOTE — Telephone Encounter (Signed)
Refill request Duloxetine Last office visit Dr. Einar Pheasant 02/16/21 See recent lab results Last refill 02/14/20 #90/3 No follow-up appointment scheduled

## 2021-02-25 ENCOUNTER — Telehealth: Payer: Self-pay | Admitting: Family Medicine

## 2021-02-25 NOTE — Telephone Encounter (Signed)
Pt daughter called wanting a call back.

## 2021-02-25 NOTE — Telephone Encounter (Signed)
Pt daughter called in stating Dr.Outlaw needs pt medical records and test results in order to make the appt

## 2021-02-25 NOTE — Telephone Encounter (Signed)
Completed in prior encounter.

## 2021-02-25 NOTE — Telephone Encounter (Signed)
Patient's daughter called back and was informed of the referral, she is aware we do not know how long this could take so she should call and try to schedule. She was also informed to keep [place] informed. Patient's daughter stated her understanding.

## 2021-03-05 ENCOUNTER — Other Ambulatory Visit: Payer: Self-pay | Admitting: Family Medicine

## 2021-03-09 NOTE — Telephone Encounter (Signed)
Last OV was an acute appt with Dr. Einar Pheasant on 02/16/21, last filled on 08/28/20 #60 caps with 1 refill

## 2021-03-09 NOTE — Telephone Encounter (Signed)
Has she had her GI appt yet?   Was supposed to be ref to Dr Erlinda Hong office   Thanks- I need to know before refilling  How is she feeling?

## 2021-03-10 NOTE — Telephone Encounter (Signed)
Left VM requesting pt/family to call the office back

## 2021-03-17 ENCOUNTER — Telehealth: Payer: Self-pay | Admitting: Family Medicine

## 2021-03-17 DIAGNOSIS — I10 Essential (primary) hypertension: Secondary | ICD-10-CM

## 2021-03-17 DIAGNOSIS — D500 Iron deficiency anemia secondary to blood loss (chronic): Secondary | ICD-10-CM

## 2021-03-17 DIAGNOSIS — R195 Other fecal abnormalities: Secondary | ICD-10-CM

## 2021-03-17 NOTE — Telephone Encounter (Signed)
Patients Daughter, [name] has called to say that the pt did not see her GI doctor yet but they did call earlier today to set up that appointment for Dec. 20th at 9am  The pt is feeling okay with taking her iron supplements

## 2021-03-17 NOTE — Telephone Encounter (Signed)
I need to get some labs when she is able since GI is a month out  I want to check cbc/iron and renal fxn I put the orders are in

## 2021-03-17 NOTE — Telephone Encounter (Signed)
Please see Dr. Marliss Coots comment. Please schedule non fasting lab appt when pt's is able either GO or BS

## 2021-03-18 NOTE — Telephone Encounter (Signed)
Called mrs. Currier and spoke to her daughter [name] and set up the lab apt on 12/9 @945 

## 2021-03-24 DIAGNOSIS — Z006 Encounter for examination for normal comparison and control in clinical research program: Secondary | ICD-10-CM

## 2021-03-24 NOTE — Research (Signed)
[employer] Research Study  4 Year Follow-up  No serous adverse events to report. Crestor was resumed in January 10 MG QD. No Chest pains reported at this time.  Will follow up at 5 year follow-up.     Current Outpatient Medications:    aspirin EC 81 MG tablet, Take 1 tablet (81 mg total) by mouth daily., Disp: 30 tablet, Rfl: 3   BAYER BACK & BODY 500-32.5 MG TABS, Take 1-3 tablets by mouth every 6 (six) hours as needed (for pain)., Disp: , Rfl:    carvedilol (COREG) 3.125 MG tablet, Take 1 tablet (3.125 mg total) by mouth 2 (two) times daily., Disp: 180 tablet, Rfl: 3   Cholecalciferol (VITAMIN D-3) 25 MCG (1000 UT) CAPS, Take 1,000 Units by mouth daily., Disp: , Rfl:    Colchicine 0.6 MG CAPS, Take 1 capsule by mouth in the morning and at bedtime. (Patient taking differently: Take 0.6 mg by mouth in the morning and at bedtime.), Disp: 60 capsule, Rfl: 1   colchicine 0.6 MG tablet, TAKE ONE TABLET BY MOUTH TWICE A DAY IN THE MORNING AND EVENING, Disp: 60 tablet, Rfl: 3   Cyanocobalamin (VITAMIN B-12 PO), Take 1 tablet by mouth daily., Disp: , Rfl:    DULoxetine (CYMBALTA) 30 MG capsule, TAKE ONE CAPSULE BY MOUTH DAILY, Disp: 90 capsule, Rfl: 3   fluticasone (FLONASE) 50 MCG/ACT nasal spray, Place 2 sprays into both nostrils daily., Disp: 16 g, Rfl: 6   Iron, Ferrous Sulfate, 325 (65 Fe) MG TABS, Take 325 mg by mouth daily., Disp: 90 tablet, Rfl: 1   multivitamin-iron-minerals-folic acid (CENTRUM) chewable tablet, Chew 1 tablet by mouth daily., Disp: 90 tablet, Rfl: 0   nitroGLYCERIN (NITROSTAT) 0.4 MG SL tablet, Place 1 tablet (0.4 mg total) under the tongue every 5 (five) minutes x 3 doses as needed for chest pain., Disp: 25 tablet, Rfl: 12   rosuvastatin (CRESTOR) 10 MG tablet, Take 1 tablet (10 mg total) by mouth daily., Disp: 90 tablet, Rfl: 3

## 2021-03-27 ENCOUNTER — Other Ambulatory Visit (INDEPENDENT_AMBULATORY_CARE_PROVIDER_SITE_OTHER): Payer: Medicare Other

## 2021-03-27 ENCOUNTER — Other Ambulatory Visit: Payer: Self-pay

## 2021-03-27 DIAGNOSIS — I10 Essential (primary) hypertension: Secondary | ICD-10-CM | POA: Diagnosis not present

## 2021-03-27 DIAGNOSIS — D500 Iron deficiency anemia secondary to blood loss (chronic): Secondary | ICD-10-CM | POA: Diagnosis not present

## 2021-03-27 LAB — CBC WITH DIFFERENTIAL/PLATELET
Basophils Absolute: 0.1 10*3/uL (ref 0.0–0.1)
Basophils Relative: 0.7 % (ref 0.0–3.0)
Eosinophils Absolute: 0.2 10*3/uL (ref 0.0–0.7)
Eosinophils Relative: 3.3 % (ref 0.0–5.0)
HCT: 31.7 % — ABNORMAL LOW (ref 36.0–46.0)
Hemoglobin: 9.9 g/dL — ABNORMAL LOW (ref 12.0–15.0)
Lymphocytes Relative: 23.8 % (ref 12.0–46.0)
Lymphs Abs: 1.6 10*3/uL (ref 0.7–4.0)
MCHC: 31.3 g/dL (ref 30.0–36.0)
MCV: 85 fl (ref 78.0–100.0)
Monocytes Absolute: 0.7 10*3/uL (ref 0.1–1.0)
Monocytes Relative: 11.1 % (ref 3.0–12.0)
Neutro Abs: 4.1 10*3/uL (ref 1.4–7.7)
Neutrophils Relative %: 61.1 % (ref 43.0–77.0)
Platelets: 196 10*3/uL (ref 150.0–400.0)
RBC: 3.73 Mil/uL — ABNORMAL LOW (ref 3.87–5.11)
RDW: 26 % — ABNORMAL HIGH (ref 11.5–15.5)
WBC: 6.7 10*3/uL (ref 4.0–10.5)

## 2021-03-27 LAB — COMPREHENSIVE METABOLIC PANEL
ALT: 12 U/L (ref 0–35)
AST: 17 U/L (ref 0–37)
Albumin: 3.8 g/dL (ref 3.5–5.2)
Alkaline Phosphatase: 111 U/L (ref 39–117)
BUN: 29 mg/dL — ABNORMAL HIGH (ref 6–23)
CO2: 25 mEq/L (ref 19–32)
Calcium: 9 mg/dL (ref 8.4–10.5)
Chloride: 103 mEq/L (ref 96–112)
Creatinine, Ser: 1.47 mg/dL — ABNORMAL HIGH (ref 0.40–1.20)
GFR: 32.1 mL/min — ABNORMAL LOW (ref >60.00)
Glucose, Bld: 182 mg/dL — ABNORMAL HIGH (ref 70–99)
Potassium: 4 mEq/L (ref 3.5–5.1)
Sodium: 139 mEq/L (ref 135–145)
Total Bilirubin: 0.4 mg/dL (ref 0.2–1.2)
Total Protein: 6.5 g/dL (ref 6.0–8.3)

## 2021-03-27 LAB — IRON: Iron: 37 ug/dL — ABNORMAL LOW (ref 42–145)

## 2021-03-27 LAB — FERRITIN: Ferritin: 24.1 ng/mL (ref 10.0–291.0)

## 2021-04-07 ENCOUNTER — Other Ambulatory Visit: Payer: Self-pay | Admitting: Gastroenterology

## 2021-04-07 DIAGNOSIS — D509 Iron deficiency anemia, unspecified: Secondary | ICD-10-CM | POA: Diagnosis not present

## 2021-04-07 DIAGNOSIS — R634 Abnormal weight loss: Secondary | ICD-10-CM

## 2021-04-23 NOTE — Progress Notes (Deleted)
Subjective:   Valerie Ramirez is a 88 y.o. female who presents for Medicare Annual (Subsequent) preventive examination.  I connected with Valerie Ramirez today by telephone and verified that I am speaking with the correct person using two identifiers. Location patient: home Location provider: work Persons participating in the virtual visit: patient, Marine scientist.    I discussed the limitations, risks, security and privacy concerns of performing an evaluation and management service by telephone and the availability of in person appointments. I also discussed with the patient that there may be a patient responsible charge related to this service. The patient expressed understanding and verbally consented to this telephonic visit.    Interactive audio and video telecommunications were attempted between this provider and patient, however failed, due to patient having technical difficulties OR patient did not have access to video capability.  We continued and completed visit with audio only.  Some vital signs may be absent or patient reported.   Time Spent with patient on telephone encounter: *** minutes  Review of Systems           Objective:    There were no vitals filed for this visit. There is no height or weight on file to calculate BMI.  Advanced Directives 02/10/2021 07/15/2020 12/19/2017 07/27/2017 04/26/2017 04/04/2017 07/26/2016  Does Patient Have a Medical Advance Directive? Yes No Yes Yes No No Yes  Type of Paramedic of [place];Living will - - [place];Living will - - [place];Living will  Does patient want to make changes to medical advance directive? - - - - - - -  Copy of [place] in Chart? - - - No - copy requested - - No - copy requested  Would patient like information on creating a medical advance directive? - No - Patient declined - - No - Patient declined No - Patient declined -  Pre-existing out  of facility DNR order (yellow form or pink MOST form) - - - - - - -    Current Medications (verified) Outpatient Encounter Medications as of 04/27/2021  Medication Sig   aspirin EC 81 MG tablet Take 1 tablet (81 mg total) by mouth daily.   BAYER BACK & BODY 500-32.5 MG TABS Take 1-3 tablets by mouth every 6 (six) hours as needed (for pain).   carvedilol (COREG) 3.125 MG tablet Take 1 tablet (3.125 mg total) by mouth 2 (two) times daily.   Cholecalciferol (VITAMIN D-3) 25 MCG (1000 UT) CAPS Take 1,000 Units by mouth daily.   Colchicine 0.6 MG CAPS Take 1 capsule by mouth in the morning and at bedtime. (Patient taking differently: Take 0.6 mg by mouth in the morning and at bedtime.)   colchicine 0.6 MG tablet TAKE ONE TABLET BY MOUTH TWICE A DAY IN THE MORNING AND EVENING   Cyanocobalamin (VITAMIN B-12 PO) Take 1 tablet by mouth daily.   DULoxetine (CYMBALTA) 30 MG capsule TAKE ONE CAPSULE BY MOUTH DAILY   fluticasone (FLONASE) 50 MCG/ACT nasal spray Place 2 sprays into both nostrils daily.   Iron, Ferrous Sulfate, 325 (65 Fe) MG TABS Take 325 mg by mouth daily.   multivitamin-iron-minerals-folic acid (CENTRUM) chewable tablet Chew 1 tablet by mouth daily.   nitroGLYCERIN (NITROSTAT) 0.4 MG SL tablet Place 1 tablet (0.4 mg total) under the tongue every 5 (five) minutes x 3 doses as needed for chest pain.   rosuvastatin (CRESTOR) 10 MG tablet Take 1 tablet (10 mg total) by mouth  daily.   No facility-administered encounter medications on file as of 04/27/2021.    Allergies (verified) Allopurinol, Lipitor [atorvastatin calcium], Morphine and related, Simvastatin, and Tape   History: Past Medical History:  Diagnosis Date   Arthritis    OA   CAD (coronary artery disease)    stent December 2018   Cancer [facility])    endometrial   GERD (gastroesophageal reflux disease)    Hearing loss    History of kidney stones    Hyperlipidemia    Hypertension    Hypothyroid    "not in years"   Shortness  of breath dyspnea    04/25/17- not anymore   Past Surgical History:  Procedure Laterality Date   ABDOMINAL HYSTERECTOMY     ANTERIOR CERVICAL DECOMP/DISCECTOMY FUSION [name] 08/18/2015   Procedure: C5-6, C6-7 Anterior Cervical Discectomy and Fusion, Allograft, Plate;  Surgeon: Marybelle Killings, MD;  Location: [place];  Service: Orthopedics;  Laterality: [name];   bone spur removed Right    shoulder   BOWEL RESECTION  07/04/2012   Procedure: SMALL BOWEL RESECTION;  Surgeon: Alvino Chapel, MD;  Location: [facility];  Service: Gynecology;;   BREAST BIOPSY     CHOLECYSTECTOMY     CORONARY STENT INTERVENTION [name] 04/06/2017   Procedure: CORONARY STENT INTERVENTION;  Surgeon: Wellington Hampshire, MD;  Location: [place] [facility];  Service: Cardiovascular;  Laterality: [name];   DILATION AND CURETTAGE OF UTERUS  1999   EYE SURGERY Bilateral    cataracts   FALSE ANEURYSM REPAIR Right 04/26/2017   Procedure: 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;  Surgeon: Conrad , MD;  Location: [place];  Service: Vascular;  Laterality: Right;   HAND SURGERY  12/2008   after hand fx/due to fall   JOINT REPLACEMENT Bilateral 07/1998   knees   KNEE ARTHROPLASTY  05/23/1998   total right   LAPAROTOMY Bilateral 07/04/2012   Procedure: EXPLORATORY LAPAROTOMY TOTAL ABDOMINAL HYSTERECTOMY BILATERAL SALPINGO-OOPHORECTOMY with small bowel resection ;  Surgeon: Alvino Chapel, MD;  Location: [facility];  Service: Gynecology;  Laterality: Bilateral;   LEFT HEART CATH AND CORONARY ANGIOGRAPHY [name] 04/06/2017   Procedure: LEFT HEART CATH AND CORONARY ANGIOGRAPHY;  Surgeon: Jolaine Artist, MD;  Location: [place] [facility];  Service: Cardiovascular;  Laterality: [name];   polyp removal  1999   TUBAL LIGATION     Family History  Problem Relation Age of Onset   Heart disease Father    Heart attack Father    Leukemia Brother    Lung cancer Brother    Leukemia Brother    Stomach cancer Brother    Heart disease Sister         CABG   Stroke Sister    Cancer Sister    Bladder Cancer Sister    Heart disease Brother        CABG   Stroke Sister        "massive brain bleed"   Social History   Socioeconomic History   Marital status: Widowed    Spouse name: Not on file   Number of children: 2   Years of education: Not on file   Highest education level: High school graduate  Occupational History   Not on file  Tobacco Use   Smoking status: Never   Smokeless tobacco: Never  Vaping Use   Vaping Use: Never used  Substance and Sexual Activity   Alcohol use: Never    Alcohol/week: 0.0 standard drinks  Drug use: Never   Sexual activity: Never    Birth control/protection: Post-menopausal  Other Topics Concern   Not on file  Social History Narrative   Not on file   Social Determinants of Health   Financial Resource Strain: Not on file  Food Insecurity: Not on file  Transportation Needs: Not on file  Physical Activity: Not on file  Stress: Not on file  Social Connections: Not on file    Tobacco Counseling Counseling given: Not Answered   Clinical Intake:                 Diabetic? No         Activities of Daily Living No flowsheet data found.  Patient Care Team: Tower, Wynelle Fanny, MD as PCP - General (Family Medicine) [place], Peter M, MD as PCP - Cardiology (Cardiology)  Indicate any recent Medical Services you may have received from other than [facility] providers in the past year (date may be approximate).     Assessment:   This is a routine wellness examination for [place].  Hearing/Vision screen No results found.  Dietary issues and exercise activities discussed:     Goals Addressed   None    Depression Screen PHQ 2/9 Scores 12/18/2018 07/27/2017 07/26/2016 08/12/2014 08/10/2013 08/08/2012 02/04/2012  PHQ - 2 Score 0 0 0 0 0 0 0  PHQ- 9 Score - 0 - - - - -    Fall Risk Fall Risk  03/14/2019 12/18/2018 01/02/2018 07/27/2017 07/26/2016  Falls in the past year? 0 0 Yes No  No  Comment Emmi Telephone Survey: data to providers prior to load - - - -  Number falls in past yr: - 0 2 or more - -  Comment - - - - -  Injury with Fall? - 0 Yes - -  Risk Factor Category  - - High Fall Risk - -  Risk for fall due to : - - - - -  Risk for fall due to: Comment - - - - -  Follow up - Falls evaluation completed Education provided;Falls prevention discussed - -    FALL RISK PREVENTION PERTAINING TO THE HOME:  Any stairs in or around the home? {YES/NO:21197} If so, are there any without handrails? {YES/NO:21197} Home free of loose throw rugs in walkways, pet beds, electrical cords, etc? {YES/NO:21197} Adequate lighting in your home to reduce risk of falls? {YES/NO:21197}  ASSISTIVE DEVICES UTILIZED TO PREVENT FALLS:  Life alert? {YES/NO:21197} Use of a cane, walker or w/c? {YES/NO:21197} Grab bars in the bathroom? {YES/NO:21197} Shower chair or bench in shower? {YES/NO:21197} Elevated toilet seat or a handicapped toilet? {YES/NO:21197}  TIMED UP AND GO:  Was the test performed? No .    Cognitive Function: MMSE - Mini Mental State Exam 07/27/2017 07/26/2016  Orientation to time 5 5  Orientation to Place 5 5  Registration 3 3  Attention/ Calculation 0 0  Recall 3 3  Language- name 2 objects 0 0  Language- repeat 1 1  Language- follow 3 step command 3 3  Language- read & follow direction 0 0  Write a sentence 0 0  Copy design 0 0  Total score 20 20        Immunizations Immunization History  Administered Date(s) Administered   Fluad Quad(high Dose 65+) 02/16/2021   Influenza Split 04/01/2011   Influenza Whole 01/20/2012   Influenza, High Dose Seasonal PF 01/29/2016, 01/05/2018   Influenza,inj,Quad PF,6+ Mos 05/16/2015   Influenza-Unspecified 02/15/2013, 02/28/2014, 03/03/2017  PFIZER(Purple Top)SARS-COV-2 Vaccination 07/12/2019, 08/06/2019   Pneumococcal Conjugate-13 08/12/2014   Pneumococcal Polysaccharide-23 07/07/2012   Td 05/14/1997,  11/02/2011    TDAP status: Up to date  {Flu Vaccine status:2101806}  Pneumococcal vaccine status: Up to date  {Covid-19 vaccine status:2101808}  Qualifies for Shingles Vaccine? Yes   Zostavax completed No   {Shingrix Completed?:2101804}  Screening Tests Health Maintenance  Topic Date Due   Zoster Vaccines- Shingrix (1 of 2) Never done   COVID-19 Vaccine (3 - Pfizer risk series) 09/03/2019   MAMMOGRAM  12/17/2028 (Originally 10/05/2014)   TETANUS/TDAP  11/01/2021   Pneumonia Vaccine 26+ Years old  Completed   INFLUENZA VACCINE  Completed   DEXA SCAN  Completed   HPV VACCINES  Aged Out    Health Maintenance  Health Maintenance Due  Topic Date Due   Zoster Vaccines- Shingrix (1 of 2) Never done   COVID-19 Vaccine (3 - Pfizer risk series) 09/03/2019    Colorectal cancer screening: No longer required.   {Mammogram status:21018020}  Bone Density status: no longer required   Lung Cancer Screening: (Low Dose CT Chest recommended if Age 77-80 years, 30 pack-year currently smoking OR have quit w/in 15years.) does not qualify.     Additional Screening:  Hepatitis C Screening: does not qualify;   Vision Screening: Recommended annual ophthalmology exams for early detection of glaucoma and other disorders of the eye. Is the patient up to date with their annual eye exam?  {YES/NO:21197} Who is the provider or what is the name of the office in which the patient attends annual eye exams? *** If pt is not established with a provider, would they like to be referred to a provider to establish care? {YES/NO:21197}.   Dental Screening: Recommended annual dental exams for proper oral hygiene  Community Resource Referral / Chronic Care Management: CRR required this visit?  {YES/NO:21197}  CCM required this visit?  {YES/NO:21197}     Plan:     I have personally reviewed and noted the following in the patients chart:   Medical and social history Use of alcohol, tobacco or  illicit drugs  Current medications and supplements including opioid prescriptions.  Functional ability and status Nutritional status Physical activity Advanced directives List of other physicians Hospitalizations, surgeries, and ER visits in previous 12 months Vitals Screenings to include cognitive, depression, and falls Referrals and appointments  In addition, I have reviewed and discussed with patient certain preventive protocols, quality metrics, and best practice recommendations. A written personalized care plan for preventive services as well as general preventive health recommendations were provided to patient.   Due to this being a telephonic visit, the after visit summary with patients personalized plan was offered to patient via mail or my-chart. ***Patient declined at this time./ Patient would like to access on my-chart/ per request, patient was mailed a copy of AVS./ Patient preferred to pick up at office at next visit.   Loma Messing, LPN   04/24/1094   Nurse Health Advisor  Nurse Notes: none

## 2021-04-27 ENCOUNTER — Ambulatory Visit: Payer: Medicare Other

## 2021-05-01 ENCOUNTER — Ambulatory Visit
Admission: RE | Admit: 2021-05-01 | Discharge: 2021-05-01 | Disposition: A | Discharge status: Home / Self Care | Payer: Medicare Other | Source: Ambulatory Visit | Attending: Gastroenterology | Admitting: Gastroenterology

## 2021-05-01 DIAGNOSIS — K5730 Diverticulosis of large intestine without perforation or abscess without bleeding: Secondary | ICD-10-CM | POA: Diagnosis not present

## 2021-05-01 DIAGNOSIS — D509 Iron deficiency anemia, unspecified: Secondary | ICD-10-CM | POA: Diagnosis not present

## 2021-05-01 DIAGNOSIS — R634 Abnormal weight loss: Secondary | ICD-10-CM

## 2021-05-01 MED ORDER — IOPAMIDOL (ISOVUE-300) INJECTION 61%
100.0000 mL | Freq: Once | INTRAVENOUS | Status: AC | PRN
  Administered 2021-05-01: 100 mL via INTRAVENOUS

## 2021-06-24 ENCOUNTER — Encounter: Payer: Self-pay | Admitting: Nurse Practitioner

## 2021-06-24 ENCOUNTER — Ambulatory Visit
Admission: RE | Admit: 2021-06-24 | Discharge: 2021-06-24 | Disposition: A | Discharge status: Home / Self Care | Payer: Medicare Other | Source: Ambulatory Visit | Attending: Nurse Practitioner | Admitting: Nurse Practitioner

## 2021-06-24 ENCOUNTER — Ambulatory Visit (INDEPENDENT_AMBULATORY_CARE_PROVIDER_SITE_OTHER): Payer: Medicare Other | Admitting: Nurse Practitioner

## 2021-06-24 ENCOUNTER — Ambulatory Visit
Admission: RE | Admit: 2021-06-24 | Discharge: 2021-06-24 | Disposition: A | Discharge status: Home / Self Care | Payer: Medicare Other | Source: Home / Self Care | Attending: Nurse Practitioner | Admitting: Nurse Practitioner

## 2021-06-24 ENCOUNTER — Other Ambulatory Visit: Payer: Self-pay

## 2021-06-24 VITALS — BP 154/98 | HR 71 | Temp 97.8°F | Resp 14 | Ht 62.0 in | Wt 193.5 lb

## 2021-06-24 DIAGNOSIS — M19011 Primary osteoarthritis, right shoulder: Secondary | ICD-10-CM | POA: Insufficient documentation

## 2021-06-24 DIAGNOSIS — M25561 Pain in right knee: Secondary | ICD-10-CM

## 2021-06-24 DIAGNOSIS — H6123 Impacted cerumen, bilateral: Secondary | ICD-10-CM

## 2021-06-24 DIAGNOSIS — M25511 Pain in right shoulder: Secondary | ICD-10-CM

## 2021-06-24 DIAGNOSIS — W19XXXA Unspecified fall, initial encounter: Secondary | ICD-10-CM | POA: Diagnosis not present

## 2021-06-24 DIAGNOSIS — R55 Syncope and collapse: Secondary | ICD-10-CM | POA: Diagnosis not present

## 2021-06-24 DIAGNOSIS — Z8679 Personal history of other diseases of the circulatory system: Secondary | ICD-10-CM

## 2021-06-24 DIAGNOSIS — I639 Cerebral infarction, unspecified: Secondary | ICD-10-CM | POA: Diagnosis not present

## 2021-06-24 DIAGNOSIS — M25562 Pain in left knee: Secondary | ICD-10-CM | POA: Insufficient documentation

## 2021-06-24 DIAGNOSIS — Z96652 Presence of left artificial knee joint: Secondary | ICD-10-CM | POA: Insufficient documentation

## 2021-06-24 DIAGNOSIS — T1490XA Injury, unspecified, initial encounter: Secondary | ICD-10-CM | POA: Diagnosis present

## 2021-06-24 DIAGNOSIS — Z8673 Personal history of transient ischemic attack (TIA), and cerebral infarction without residual deficits: Secondary | ICD-10-CM | POA: Insufficient documentation

## 2021-06-24 DIAGNOSIS — R42 Dizziness and giddiness: Secondary | ICD-10-CM

## 2021-06-24 DIAGNOSIS — I6789 Other cerebrovascular disease: Secondary | ICD-10-CM | POA: Insufficient documentation

## 2021-06-24 DIAGNOSIS — M2578 Osteophyte, vertebrae: Secondary | ICD-10-CM | POA: Diagnosis not present

## 2021-06-24 NOTE — Assessment & Plan Note (Signed)
Consent obtained.  Cerumen softening drops were instilled in bilateral ears.  According to office policy mixture of warm water and hydroperoxide was mixed and bilateral ears were irrigated.  Most of impaction removed from right ear but still some in place.  Left impaction removed completely TM within normal limits.  Did instruct patient she can use cerumen softening eardrops as directed on the box and follow-up for more irrigation. ?

## 2021-06-24 NOTE — Assessment & Plan Note (Signed)
To be multifactorial.  Could be due to patient's blood pressure, orthostatic vital signs, another stroke, hemodynamically instability.  Pending lab and x-ray results no red flags in office today EKG within normal limits ?

## 2021-06-24 NOTE — Assessment & Plan Note (Signed)
She fell approximately 5 days ago does not remember the event.  Presuming syncope and collapse obtain CT head and neck pending results ?

## 2021-06-24 NOTE — Patient Instructions (Signed)
Nice to see you today ?Go to [facility] and get the Ct scans and xrays done ?Check your blood pressure at home about 3 times a week and let us know ?Keep the appointment with the neurology. ?If you pass out again go to the emergency department  ?

## 2021-06-24 NOTE — Assessment & Plan Note (Signed)
Patient has history of bilateral knee replacements.  Presumably had syncope and collapse landing on her knees complains of bilateral knee pain along with ecchymosis to the left medial knee.  Pending x-rays ?

## 2021-06-24 NOTE — Progress Notes (Signed)
Acute Office Visit  Subjective:    Patient ID: Valerie Ramirez, female    DOB: Jul 04, 1932, 88 y.o.   MRN: [id number]  Chief Complaint  Patient presents with   Fall    First time was 2 weeks ago at home-was unbalance. Last time fell on 06/19/21 at home- she is not sure what happened. Fell on her knees-bruises present.     Fall Associated symptoms include headaches. Pertinent negatives include no fever.  Patient is in today for Fall  Patient has a history of recurrent falls, Sick sinus syndrome, and CVA.  First fall time was 2 weeks ago. Last fall was the last Friday (06/19/2021). She states that she cannot remember what happened. States she thinks she fell on her knees, states that her knees are bruised and she remembers being on them. States that she did hit her head on her right side. States she did push her self through the house and pulled herslef up with her right arm that is now hurting. Patient is accompanied by her daughter at bedside     Past Medical History:  Diagnosis Date   Arthritis    OA   CAD (coronary artery disease)    stent December 2018   Cancer [facility])    endometrial   GERD (gastroesophageal reflux disease)    Hearing loss    History of kidney stones    Hyperlipidemia    Hypertension    Hypothyroid    "not in years"   Shortness of breath dyspnea    04/25/17- not anymore    Past Surgical History:  Procedure Laterality Date   ABDOMINAL HYSTERECTOMY     ANTERIOR CERVICAL DECOMP/DISCECTOMY FUSION [name] 08/18/2015   Procedure: C5-6, C6-7 Anterior Cervical Discectomy and Fusion, Allograft, Plate;  Surgeon: Eldred Manges, MD;  Location: [facility] OR;  Service: Orthopedics;  Laterality: [name];   bone spur removed Right    shoulder   BOWEL RESECTION  07/04/2012   Procedure: SMALL BOWEL RESECTION;  Surgeon: Jeannette Corpus, MD;  Location: [facility];  Service: Gynecology;;   BREAST BIOPSY     CHOLECYSTECTOMY     CORONARY STENT INTERVENTION [name] 04/06/2017   Procedure:  CORONARY STENT INTERVENTION;  Surgeon: Iran Ouch, MD;  [facility]: [facility];  Service: Cardiovascular;  Laterality: [name];   DILATION AND CURETTAGE OF UTERUS  1999   EYE SURGERY Bilateral    cataracts   FALSE ANEURYSM REPAIR Right 04/26/2017   Procedure: 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;  Surgeon: Fransisco Hertz, MD;  Location: [facility] OR;  Service: Vascular;  Laterality: Right;   HAND SURGERY  12/2008   after hand fx/due to fall   JOINT REPLACEMENT Bilateral 07/1998   knees   KNEE ARTHROPLASTY  05/23/1998   total right   LAPAROTOMY Bilateral 07/04/2012   Procedure: EXPLORATORY LAPAROTOMY TOTAL ABDOMINAL HYSTERECTOMY BILATERAL SALPINGO-OOPHORECTOMY with small bowel resection ;  Surgeon: Jeannette Corpus, MD;  [facility]: [facility];  Service: Gynecology;  Laterality: Bilateral;   LEFT HEART CATH AND CORONARY ANGIOGRAPHY [name] 04/06/2017   Procedure: LEFT HEART CATH AND CORONARY ANGIOGRAPHY;  Surgeon: Dolores Patty, MD;  [facility];  Service: Cardiovascular;  Laterality: [name];   polyp removal  1999   TUBAL LIGATION      Family History  Problem Relation Age of Onset   Heart disease Father    Heart attack Father    Leukemia Brother    Lung cancer Brother  Leukemia Brother    Stomach cancer Brother    Heart disease Sister        CABG   Stroke Sister    Cancer Sister    Bladder Cancer Sister    Heart disease Brother        CABG   Stroke Sister        "massive brain bleed"    Social History   Socioeconomic History   Marital status: Widowed    Spouse name: Not on file   Number of children: 2   Years of education: Not on file   Highest education level: High school graduate  Occupational History   Not on file  Tobacco Use   Smoking status: Never   Smokeless tobacco: Never  Vaping Use   Vaping Use: Never used  Substance and Sexual Activity   Alcohol use: Never    Alcohol/week: 0.0 standard drinks   Drug use: Never   Sexual  activity: Never    Birth control/protection: Post-menopausal  Other Topics Concern   Not on file  Social History Narrative   Not on file   Social Determinants of Health   Financial Resource Strain: Not on file  Food Insecurity: Not on file  Transportation Needs: Not on file  Physical Activity: Not on file  Stress: Not on file  Social Connections: Not on file  Intimate Partner Violence: Not on file    Outpatient Medications Prior to Visit  Medication Sig Dispense Refill   aspirin EC 81 MG tablet Take 1 tablet (81 mg total) by mouth daily. 30 tablet 3   BAYER BACK & BODY 500-32.5 MG TABS Take 1-3 tablets by mouth every 6 (six) hours as needed (for pain).     Cholecalciferol (VITAMIN D-3) 25 MCG (1000 UT) CAPS Take 1,000 Units by mouth daily.     Colchicine 0.6 MG CAPS Take 1 capsule by mouth in the morning and at bedtime. (Patient taking differently: Take 0.6 mg by mouth in the morning and at bedtime.) 60 capsule 1   colchicine 0.6 MG tablet TAKE ONE TABLET BY MOUTH TWICE A DAY IN THE MORNING AND EVENING 60 tablet 3   Cyanocobalamin (VITAMIN B-12 PO) Take 1 tablet by mouth daily.     DULoxetine (CYMBALTA) 30 MG capsule TAKE ONE CAPSULE BY MOUTH DAILY 90 capsule 3   Iron, Ferrous Sulfate, 325 (65 Fe) MG TABS Take 325 mg by mouth daily. 90 tablet 1   multivitamin-iron-minerals-folic acid (CENTRUM) chewable tablet Chew 1 tablet by mouth daily. 90 tablet 0   nitroGLYCERIN (NITROSTAT) 0.4 MG SL tablet Place 1 tablet (0.4 mg total) under the tongue every 5 (five) minutes x 3 doses as needed for chest pain. 25 tablet 12   carvedilol (COREG) 3.125 MG tablet Take 1 tablet (3.125 mg total) by mouth 2 (two) times daily. 180 tablet 3   fluticasone (FLONASE) 50 MCG/ACT nasal spray Place 2 sprays into both nostrils daily. 16 g 6   rosuvastatin (CRESTOR) 10 MG tablet Take 1 tablet (10 mg total) by mouth daily. 90 tablet 3   No facility-administered medications prior to visit.    Allergies   Allergen Reactions   Allopurinol Nausea Only   Lipitor [Atorvastatin Calcium] Itching and Other (See Comments)    Leg aches and aching all over   Morphine And Related Other (See Comments)    Makes the patient "loopy,"    Simvastatin Other (See Comments)    MUSCLE PAIN   Tape Itching, Rash and  Other (See Comments)    "Cannot use paper tape" --- also causes blisters. Use plastic tape    Review of Systems  Constitutional:  Negative for chills and fever.  Respiratory:  Negative for shortness of breath.   Cardiovascular:  Negative for chest pain.  Musculoskeletal:  Positive for arthralgias and neck pain.  Neurological:  Positive for syncope, light-headedness (States she gets swimmy heady) and headaches. Negative for dizziness, facial asymmetry and weakness.      Objective:    Physical Exam Vitals and nursing note reviewed.  Constitutional:      Appearance: Normal appearance.  HENT:     Right Ear: There is impacted cerumen.     Left Ear: There is impacted cerumen.     Ears:     Comments: Hearing aid to the left ear  Eyes:     Extraocular Movements: Extraocular movements intact.     Pupils: Pupils are equal, round, and reactive to light.     Comments: Lateral nystagmus on EOM exam  Neck:     Vascular: No carotid bruit.  Cardiovascular:     Rate and Rhythm: Normal rate. Rhythm irregular.  Pulmonary:     Effort: Pulmonary effort is normal.     Breath sounds: Normal breath sounds.  Abdominal:     General: Bowel sounds are normal.  Musculoskeletal:        General: Tenderness and signs of injury present.     Right shoulder: Tenderness present. No deformity or effusion. Normal pulse.       Arms:     Cervical back: Bony tenderness present.     Right knee: No ecchymosis. Tenderness present.     Left knee: Ecchymosis present. Tenderness present.       Legs:     Comments: Hx of cervical surgery   Lymphadenopathy:     Cervical: No cervical adenopathy.  Skin:    General:  Skin is warm.  Neurological:     General: No focal deficit present.     Mental Status: She is alert.     Coordination: Finger-Nose-Finger Test normal.     Gait: Gait normal.     Deep Tendon Reflexes: Reflexes are normal and symmetric.     Comments: Bilateral upper and lower extremity 5/5    BP (!) 154/98   Pulse 71   Temp 97.8 F (36.6 C)   Resp 14   Ht 5\' 2"  (1.575 m)   Wt 193 lb 8 oz (87.8 kg)   SpO2 100%   BMI 35.39 kg/m  Wt Readings from Last 3 Encounters:  06/24/21 193 lb 8 oz (87.8 kg)  02/16/21 201 lb 8 oz (91.4 kg)  02/10/21 201 lb 12.8 oz (91.5 kg)    Health Maintenance Due  Topic Date Due   Zoster Vaccines- Shingrix (1 of 2) Never done   COVID-19 Vaccine (3 - Pfizer risk series) 09/03/2019    There are no preventive care reminders to display for this patient.   Lab Results  Component Value Date   TSH 1.990 02/09/2021   Lab Results  Component Value Date   WBC 6.7 03/27/2021   HGB 9.9 (L) 03/27/2021   HCT 31.7 (L) 03/27/2021   MCV 85.0 03/27/2021   PLT 196.0 03/27/2021   Lab Results  Component Value Date   NA 139 03/27/2021   K 4.0 03/27/2021   CO2 25 03/27/2021   GLUCOSE 182 (H) 03/27/2021   BUN 29 (H) 03/27/2021   CREATININE 1.47 (H) 03/27/2021  BILITOT 0.4 03/27/2021   ALKPHOS 111 03/27/2021   AST 17 03/27/2021   ALT 12 03/27/2021   PROT 6.5 03/27/2021   ALBUMIN 3.8 03/27/2021   CALCIUM 9.0 03/27/2021   ANIONGAP 10 02/10/2021   EGFR 29 (L) 02/09/2021   GFR 32.10 (L) 03/27/2021   Lab Results  Component Value Date   CHOL 206 (H) 02/09/2021   Lab Results  Component Value Date   HDL 53 02/09/2021   Lab Results  Component Value Date   LDLCALC 130 (H) 02/09/2021   Lab Results  Component Value Date   TRIG 131 02/09/2021   Lab Results  Component Value Date   CHOLHDL 3.9 02/09/2021   Lab Results  Component Value Date   HGBA1C 6.1 12/11/2018       Assessment & Plan:   Problem List Items Addressed This Visit        Nervous and Auditory   Bilateral impacted cerumen    Consent obtained.  Cerumen softening drops were instilled in bilateral ears.  According to office policy mixture of warm water and hydroperoxide was mixed and bilateral ears were irrigated.  Most of impaction removed from right ear but still some in place.  Left impaction removed completely TM within normal limits.  Did instruct patient she can use cerumen softening eardrops as directed on the box and follow-up for more irrigation.      Relevant Orders   Ear Lavage     Other   H/O: CVA (cerebrovascular accident) (Chronic)    History of infarcts on MRI.  Given patient had syncope and collapse we will obtain CT of head and neck.  Pending results      Acute pain of right shoulder    Patient complaining of acute right shoulder pain.  Unsure if it was due to the fall or scooting herself and pulling herself up with her right arm on her railing on her back porch.  Will obtain films pending result.      Relevant Orders   DG Shoulder Right   H/O subdural hemorrhage    History of subdural hemorrhage.  Patient currently on baby aspirin no other antiplatelet or blood thinners.  Given history of patient following with syncope and striking head obtain CT head and neck.      Fall - Primary    Patient has a history of recurrent falls.  Has fallen twice in the past 2 weeks.  Patient did have a little bit of orthostatic changes in blood pressure.  Patient does have history of hypertension was taken off medications due to orthostasis.  Did give instructions to wait when she changes positions.  Patient also advised to check blood pressure 3 times a week and report readings      Relevant Orders   CT HEAD WO CONTRAST ( )   CT CERVICAL SPINE WO CONTRAST   DG Knee Complete 4 Views Left   DG Knee Complete 4 Views Right   DG Shoulder Right   CBC   Comprehensive metabolic panel   IBC + Ferritin   Syncope and collapse    She fell approximately 5 days  ago does not remember the event.  Presuming syncope and collapse obtain CT head and neck pending results      Relevant Orders   EKG 12-Lead (Completed)   CT HEAD WO CONTRAST ( )   CBC   Comprehensive metabolic panel   IBC + Ferritin   TSH   Acute pain of both knees  Patient has history of bilateral knee replacements.  Presumably had syncope and collapse landing on her knees complains of bilateral knee pain along with ecchymosis to the left medial knee.  Pending x-rays      Relevant Orders   DG Knee Complete 4 Views Left   DG Knee Complete 4 Views Right   Light headedness    To be multifactorial.  Could be due to patient's blood pressure, orthostatic vital signs, another stroke, hemodynamically instability.  Pending lab and x-ray results no red flags in office today EKG within normal limits      Relevant Orders   Orthostatic vital signs     No orders of the defined types were placed in this encounter.  This visit occurred during the SARS-CoV-2 public health emergency.  Safety protocols were in place, including screening questions prior to the visit, additional usage of staff PPE, and extensive cleaning of exam room while observing appropriate contact time as indicated for disinfecting solutions.    Audria Nine, NP

## 2021-06-24 NOTE — Assessment & Plan Note (Signed)
History of infarcts on MRI.  Given patient had syncope and collapse we will obtain CT of head and neck.  Pending results ?

## 2021-06-24 NOTE — Assessment & Plan Note (Signed)
Patient complaining of acute right shoulder pain.  Unsure if it was due to the fall or scooting herself and pulling herself up with her right arm on her railing on her back porch.  Will obtain films pending result. ?

## 2021-06-24 NOTE — Assessment & Plan Note (Signed)
Patient has a history of recurrent falls.  Has fallen twice in the past 2 weeks.  Patient did have a little bit of orthostatic changes in blood pressure.  Patient does have history of hypertension was taken off medications due to orthostasis.  Did give instructions to wait when she changes positions.  Patient also advised to check blood pressure 3 times a week and report readings ?

## 2021-06-24 NOTE — Assessment & Plan Note (Signed)
History of subdural hemorrhage.  Patient currently on baby aspirin no other antiplatelet or blood thinners.  Given history of patient following with syncope and striking head obtain CT head and neck. ?

## 2021-06-25 ENCOUNTER — Ambulatory Visit: Payer: Medicare Other

## 2021-06-25 ENCOUNTER — Other Ambulatory Visit (INDEPENDENT_AMBULATORY_CARE_PROVIDER_SITE_OTHER): Payer: Medicare Other

## 2021-06-25 DIAGNOSIS — R55 Syncope and collapse: Secondary | ICD-10-CM | POA: Diagnosis not present

## 2021-06-25 DIAGNOSIS — W19XXXA Unspecified fall, initial encounter: Secondary | ICD-10-CM | POA: Diagnosis not present

## 2021-06-25 LAB — CBC
HCT: 39.4 % (ref 36.0–46.0)
Hemoglobin: 12.9 g/dL (ref 12.0–15.0)
MCHC: 32.7 g/dL (ref 30.0–36.0)
MCV: 92.9 fl (ref 78.0–100.0)
Platelets: 178 10*3/uL (ref 150.0–400.0)
RBC: 4.24 Mil/uL (ref 3.87–5.11)
RDW: 15.4 % (ref 11.5–15.5)
WBC: 6.6 10*3/uL (ref 4.0–10.5)

## 2021-06-25 LAB — COMPREHENSIVE METABOLIC PANEL
ALT: 15 U/L (ref 0–35)
AST: 20 U/L (ref 0–37)
Albumin: 4.1 g/dL (ref 3.5–5.2)
Alkaline Phosphatase: 116 U/L (ref 39–117)
BUN: 20 mg/dL (ref 6–23)
CO2: 27 mEq/L (ref 19–32)
Calcium: 9.5 mg/dL (ref 8.4–10.5)
Chloride: 102 mEq/L (ref 96–112)
Creatinine, Ser: 1.4 mg/dL — ABNORMAL HIGH (ref 0.40–1.20)
GFR: 33.98 mL/min — ABNORMAL LOW (ref >60.00)
Glucose, Bld: 154 mg/dL — ABNORMAL HIGH (ref 70–99)
Potassium: 3.9 mEq/L (ref 3.5–5.1)
Sodium: 139 mEq/L (ref 135–145)
Total Bilirubin: 0.7 mg/dL (ref 0.2–1.2)
Total Protein: 6.7 g/dL (ref 6.0–8.3)

## 2021-06-25 LAB — IBC + FERRITIN
Ferritin: 22.1 ng/mL (ref 10.0–291.0)
Iron: 56 ug/dL (ref 42–145)
Saturation Ratios: 16.2 % — ABNORMAL LOW (ref 20.0–50.0)
TIBC: 345.8 ug/dL (ref 250.0–450.0)
Transferrin: 247 mg/dL (ref 212.0–360.0)

## 2021-06-25 LAB — TSH: TSH: 2.25 u[IU]/mL (ref 0.35–5.50)

## 2021-06-29 DIAGNOSIS — R519 Headache, unspecified: Secondary | ICD-10-CM | POA: Diagnosis not present

## 2021-06-29 DIAGNOSIS — R2689 Other abnormalities of gait and mobility: Secondary | ICD-10-CM | POA: Diagnosis not present

## 2021-06-29 DIAGNOSIS — Z981 Arthrodesis status: Secondary | ICD-10-CM | POA: Diagnosis not present

## 2021-06-29 DIAGNOSIS — H539 Unspecified visual disturbance: Secondary | ICD-10-CM | POA: Diagnosis not present

## 2021-07-16 ENCOUNTER — Ambulatory Visit: Payer: Medicare Other | Admitting: *Deleted

## 2021-07-16 DIAGNOSIS — Z0000 Encounter for general adult medical examination without abnormal findings: Secondary | ICD-10-CM

## 2021-07-16 NOTE — Progress Notes (Signed)
? ?Subjective:  ? Valerie Ramirez is a 88 y.o. female who presents for Medicare Annual (Subsequent) preventive examination. ? ?I connected with [name]  she completed questions Valerie Ramirez was not available so was unable to complete some questions.  Valerie Ramirez ) on 07/16/21 by a telephone enabled telemedicine application and verified that I am speaking with the correct person using two identifiers. ?  ?I discussed the limitations of evaluation and management by telemedicine. The patient expressed understanding and agreed to proceed. ? ?Patient location: home ? ?Provider location: tele-health not in office ? ?  ? ?Review of Systems    ?  no ?Cardiac Risk Factors include: advanced age (>57mn, >>48women);hypertension ? ?   ?Objective:  ?  ?Today's Vitals  ? 07/16/21 1127 07/16/21 1128  ?PainSc: 4  4   ? ?There is no height or weight on file to calculate BMI. ? ? ?  07/16/2021  ? 11:39 AM 02/10/2021  ? 10:11 AM 07/15/2020  ?  2:28 PM 12/19/2017  ?  3:56 PM 07/27/2017  ? 11:20 AM 04/26/2017  ?  7:23 AM 04/04/2017  ?  6:19 PM  ?Advanced Directives  ?Does Patient Have a Medical Advance Directive? No Yes No Yes Yes No No  ?Type of [place]porate [place] will   [place] will    ?Copy of [place]?     No - copy requested    ?Would patient like information on creating a medical advance directive? No - Patient declined  No - Patient declined   No - Patient declined No - Patient declined  ? ? ?Current Medications (verified) ?Outpatient Encounter Medications as of 07/16/2021  ?Medication Sig  ? aspirin EC 81 MG tablet Take 1 tablet (81 mg total) by mouth daily.  ? BAYER BACK & BODY 500-32.5 MG TABS Take 1-3 tablets by mouth every 6 (six) hours as needed (for pain).  ? Cholecalciferol (VITAMIN D-3) 25 MCG (1000 UT) CAPS Take 1,000 Units by mouth daily.  ? Colchicine 0.6 MG CAPS Take 1 capsule by mouth in the morning and at bedtime. (Patient  taking differently: Take 0.6 mg by mouth in the morning and at bedtime.)  ? colchicine 0.6 MG tablet TAKE ONE TABLET BY MOUTH TWICE A DAY IN THE MORNING AND EVENING  ? Cyanocobalamin (VITAMIN B-12 PO) Take 1 tablet by mouth daily.  ? DULoxetine (CYMBALTA) 30 MG capsule TAKE ONE CAPSULE BY MOUTH DAILY  ? Iron, Ferrous Sulfate, 325 (65 Fe) MG TABS Take 325 mg by mouth daily.  ? multivitamin-iron-minerals-folic acid (CENTRUM) chewable tablet Chew 1 tablet by mouth daily.  ? nitroGLYCERIN (NITROSTAT) 0.4 MG SL tablet Place 1 tablet (0.4 mg total) under the tongue every 5 (five) minutes x 3 doses as needed for chest pain.  ? ?No facility-administered encounter medications on file as of 07/16/2021.  ? ? ?Allergies (verified) ?Allopurinol, Lipitor [atorvastatin calcium], Morphine and related, Simvastatin, and Tape  ? ?History: ?Past Medical History:  ?Diagnosis Date  ? Arthritis   ? OA  ? CAD (coronary artery disease)   ? stent December 2018  ? Cancer ([facility]   ? endometrial  ? GERD (gastroesophageal reflux disease)   ? Hearing loss   ? History of kidney stones   ? Hyperlipidemia   ? Hypertension   ? Hypothyroid   ? "not in years"  ? Shortness of breath dyspnea   ? 04/25/17- not anymore  ? ?  Past Surgical History:  ?Procedure Laterality Date  ? ABDOMINAL HYSTERECTOMY    ? ANTERIOR CERVICAL DECOMP/DISCECTOMY FUSION [name] 08/18/2015  ? Procedure: C5-6, C6-7 Anterior Cervical Discectomy and Fusion, Allograft, Plate;  Surgeon: Valerie Killings, MD;  Location: [place];  Service: Orthopedics;  Laterality: [name];  ? bone spur removed Right   ? shoulder  ? BOWEL RESECTION  07/04/2012  ? Procedure: SMALL BOWEL RESECTION;  Surgeon: Valerie Chapel, MD;  Location: [facility];  Service: Gynecology;;  ? BREAST BIOPSY    ? CHOLECYSTECTOMY    ? CORONARY STENT INTERVENTION [name] 04/06/2017  ? Procedure: CORONARY STENT INTERVENTION;  Surgeon: Valerie Hampshire, MD;  Location: [place] CV LAB;  Service: Cardiovascular;  Laterality: [name];  ? [place] OF UTERUS  1999  ? EYE SURGERY Bilateral   ? cataracts  ? FALSE ANEURYSM REPAIR Right 04/26/2017  ? Procedure: 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;  Surgeon: Valerie Pennville, MD;  Location: [place];  Service: Vascular;  Laterality: Right;  ? HAND SURGERY  12/2008  ? after hand fx/due to fall  ? JOINT REPLACEMENT Bilateral 07/1998  ? knees  ? KNEE ARTHROPLASTY  05/23/1998  ? total right  ? LAPAROTOMY Bilateral 07/04/2012  ? Procedure: EXPLORATORY LAPAROTOMY TOTAL ABDOMINAL HYSTERECTOMY BILATERAL SALPINGO-OOPHORECTOMY with small bowel resection ;  Surgeon: Valerie Chapel, MD;  Location: [facility];  Service: Gynecology;  Laterality: Bilateral;  ? LEFT HEART CATH AND CORONARY ANGIOGRAPHY [name] 04/06/2017  ? Procedure: LEFT HEART CATH AND CORONARY ANGIOGRAPHY;  Surgeon: Valerie Artist, MD;  Location: [place] CV LAB;  Service: Cardiovascular;  Laterality: [name];  ? polyp removal  1999  ? TUBAL LIGATION    ? ?Family History  ?Problem Relation Age of Onset  ? Heart disease Father   ? Heart attack Father   ? Leukemia Brother   ? Lung cancer Brother   ? Leukemia Brother   ? Stomach cancer Brother   ? Heart disease Sister   ?     CABG  ? Stroke Sister   ? Cancer Sister   ? Bladder Cancer Sister   ? Heart disease Brother   ?     CABG  ? Stroke Sister   ?     "massive brain bleed"  ? ?Social History  ? ?Socioeconomic History  ? Marital status: Widowed  ?  Spouse name: Not on file  ? Number of children: 2  ? Years of education: Not on file  ? Highest education level: High school graduate  ?Occupational History  ? Not on file  ?Tobacco Use  ? Smoking status: Never  ? Smokeless tobacco: Never  ?Vaping Use  ? Vaping Use: Never used  ?Substance and Sexual Activity  ? Alcohol use: Never  ?  Alcohol/week: 0.0 standard drinks  ? Drug use: Never  ? Sexual activity: Never  ?  Birth control/protection: Post-menopausal  ?Other Topics Concern  ? Not on file  ?Social History Narrative  ? Not on file  ? ?Social  Determinants of Health  ? ?Financial Resource Strain: Low Risk   ? Difficulty of Paying Living Expenses: Not hard at all  ?Food Insecurity: No Food Insecurity  ? Worried About Charity fundraiser in the Last Year: Never true  ? Ran Out of Food in the Last Year: Never true  ?Transportation Needs: No Transportation Needs  ? Lack of Transportation (Medical): No  ? Lack of Transportation (Non-Medical): No  ?Physical Activity:  Insufficiently Active  ? Days of Exercise per Week: 4 days  ? Minutes of Exercise per Session: 30 min  ?Stress: No Stress Concern Present  ? Feeling of Stress : Not at all  ?Social Connections: Moderately Isolated  ? Frequency of Communication with Friends and Family: More than three times a week  ? Frequency of Social Gatherings with Friends and Family: Twice a week  ? Attends Religious Services: More than 4 times per year  ? Active Member of Clubs or Organizations: No  ? Attends Archivist Meetings: Never  ? Marital Status: Widowed  ? ? ?Tobacco Counseling ?Counseling given: Not Answered ? ? ?Clinical Intake: ? ?Pre-visit preparation completed: Yes ? ?Pain : 0-10 ?Pain Score: 4  ?Pain Type: Chronic pain ?Pain Location: Back ?Pain Onset: More than a month ago ?Pain Frequency: Constant ?Pain Relieving Factors: tylenol ?Effect of Pain on Daily Activities: yes ? ?Pain Relieving Factors: tylenol ? ?Nutritional Risks: None ?Diabetes: No ? ?How often do you need to have someone help you when you read instructions, pamphlets, or other written materials from your doctor or pharmacy?: 1 - Never ? ?Diabetic?  no ? ?Interpreter Needed?: No ? ?Information entered by :: Leroy Kennedy LPN ? ? ?Activities of Daily Living ? ?  07/16/2021  ? 11:44 AM  ?In your present state of health, do you have any difficulty performing the following activities:  ?Hearing? 1  ?Vision? 0  ?Difficulty concentrating or making decisions? 0  ?Walking or climbing stairs? 1  ?Dressing or bathing? 0  ?Doing errands, shopping? 1   ?Preparing Food and eating ? N  ?Using the Toilet? N  ?In the past six months, have you accidently leaked urine? Y  ?Do you have problems with loss of bowel control? N  ?Managing your Medications? N  ?Managing

## 2021-07-16 NOTE — Patient Instructions (Signed)
Ms. Swoveland , ?Thank you for taking time to come for your Medicare Wellness Visit. I appreciate your ongoing commitment to your health goals. Please review the following plan we discussed and let me know if I can assist you in the future.  ? ?Screening recommendations/referrals: ?Colonoscopy: no longer required ?Mammogram: no longer required ?Bone Density: Education provided ?Recommended yearly ophthalmology/optometry visit for glaucoma screening and checkup ?Recommended yearly dental visit for hygiene and checkup ?Vaccinations: ?Influenza vaccine: up to date ?Pneumococcal vaccine: up to date ?Tdap vaccine: up to date ?Shingles vaccine: Education provided   ? ?Advanced directives: Education provided ? ?Conditions/risks identified:  ? ? ? ? ?Preventive Care 28 Years and Older, Female ?Preventive care refers to lifestyle choices and visits with your health care provider that can promote health and wellness. ?What does preventive care include? ?A yearly physical exam. This is also called an annual well check. ?Dental exams once or twice a year. ?Routine eye exams. Ask your health care provider how often you should have your eyes checked. ?Personal lifestyle choices, including: ?Daily care of your teeth and gums. ?Regular physical activity. ?Eating a healthy diet. ?Avoiding tobacco and drug use. ?Limiting alcohol use. ?Practicing safe sex. ?Taking low-dose aspirin every day. ?Taking vitamin and mineral supplements as recommended by your health care provider. ?What happens during an annual well check? ?The services and screenings done by your health care provider during your annual well check will depend on your age, overall health, lifestyle risk factors, and family history of disease. ?Counseling  ?Your health care provider may ask you questions about your: ?Alcohol use. ?Tobacco use. ?Drug use. ?Emotional well-being. ?Home and relationship well-being. ?Sexual activity. ?Eating habits. ?History of falls. ?Memory and  ability to understand (cognition). ?Work and work Statistician. ?Reproductive health. ?Screening  ?You may have the following tests or measurements: ?Height, weight, and BMI. ?Blood pressure. ?Lipid and cholesterol levels. These may be checked every 5 years, or more frequently if you are over 84 years old. ?Skin check. ?Lung cancer screening. You may have this screening every year starting at age 18 if you have a 30-pack-year history of smoking and currently smoke or have quit within the past 15 years. ?Fecal occult blood test (FOBT) of the stool. You may have this test every year starting at age 72. ?Flexible sigmoidoscopy or colonoscopy. You may have a sigmoidoscopy every 5 years or a colonoscopy every 10 years starting at age 74. ?Hepatitis C blood test. ?Hepatitis B blood test. ?Sexually transmitted disease (STD) testing. ?Diabetes screening. This is done by checking your blood sugar (glucose) after you have not eaten for a while (fasting). You may have this done every 1-3 years. ?Bone density scan. This is done to screen for osteoporosis. You may have this done starting at age 57. ?Mammogram. This may be done every 1-2 years. Talk to your health care provider about how often you should have regular mammograms. ?Talk with your health care provider about your test results, treatment options, and if necessary, the need for more tests. ?Vaccines  ?Your health care provider may recommend certain vaccines, such as: ?Influenza vaccine. This is recommended every year. ?Tetanus, diphtheria, and acellular pertussis (Tdap, Td) vaccine. You may need a Td booster every 10 years. ?Zoster vaccine. You may need this after age 73. ?Pneumococcal 13-valent conjugate (PCV13) vaccine. One dose is recommended after age 43. ?Pneumococcal polysaccharide (PPSV23) vaccine. One dose is recommended after age 83. ?Talk to your health care provider about which screenings and vaccines you need  and how often you need them. ?This information is  not intended to replace advice given to you by your health care provider. Make sure you discuss any questions you have with your health care provider. ?Document Released: 05/02/2015 Document Revised: 12/24/2015 Document Reviewed: 02/04/2015 ?Elsevier Interactive Patient Education ? 2017 [employer]. ? ?Fall Prevention in the Home ?Falls can cause injuries. They can happen to people of all ages. There are many things you can do to make your home safe and to help prevent falls. ?What can I do on the outside of my home? ?Regularly fix the edges of walkways and driveways and fix any cracks. ?Remove anything that might make you trip as you walk through a door, such as a raised step or threshold. ?Trim any bushes or trees on the path to your home. ?Use bright outdoor lighting. ?Clear any walking paths of anything that might make someone trip, such as rocks or tools. ?Regularly check to see if handrails are loose or broken. Make sure that both sides of any steps have handrails. ?Any raised decks and porches should have guardrails on the edges. ?Have any leaves, snow, or ice cleared regularly. ?Use sand or salt on walking paths during winter. ?Clean up any spills in your garage right away. This includes oil or grease spills. ?What can I do in the bathroom? ?Use night lights. ?Install grab bars by the toilet and in the tub and shower. Do not use towel bars as grab bars. ?Use non-skid mats or decals in the tub or shower. ?If you need to sit down in the shower, use a plastic, non-slip stool. ?Keep the floor dry. Clean up any water that spills on the floor as soon as it happens. ?Remove soap buildup in the tub or shower regularly. ?Attach bath mats securely with double-sided non-slip rug tape. ?Do not have throw rugs and other things on the floor that can make you trip. ?What can I do in the bedroom? ?Use night lights. ?Make sure that you have a light by your bed that is easy to reach. ?Do not use any sheets or blankets that  are too big for your bed. They should not hang down onto the floor. ?Have a firm chair that has side arms. You can use this for support while you get dressed. ?Do not have throw rugs and other things on the floor that can make you trip. ?What can I do in the kitchen? ?Clean up any spills right away. ?Avoid walking on wet floors. ?Keep items that you use a lot in easy-to-reach places. ?If you need to reach something above you, use a strong step stool that has a grab bar. ?Keep electrical cords out of the way. ?Do not use floor polish or wax that makes floors slippery. If you must use wax, use non-skid floor wax. ?Do not have throw rugs and other things on the floor that can make you trip. ?What can I do with my stairs? ?Do not leave any items on the stairs. ?Make sure that there are handrails on both sides of the stairs and use them. Fix handrails that are broken or loose. Make sure that handrails are as long as the stairways. ?Check any carpeting to make sure that it is firmly attached to the stairs. Fix any carpet that is loose or worn. ?Avoid having throw rugs at the top or bottom of the stairs. If you do have throw rugs, attach them to the floor with carpet tape. ?Make sure that you  have a light switch at the top of the stairs and the bottom of the stairs. If you do not have them, ask someone to add them for you. ?What else can I do to help prevent falls? ?Wear shoes that: ?Do not have high heels. ?Have rubber bottoms. ?Are comfortable and fit you well. ?Are closed at the toe. Do not wear sandals. ?If you use a stepladder: ?Make sure that it is fully opened. Do not climb a closed stepladder. ?Make sure that both sides of the stepladder are locked into place. ?Ask someone to hold it for you, if possible. ?Clearly mark and make sure that you can see: ?Any grab bars or handrails. ?First and last steps. ?Where the edge of each step is. ?Use tools that help you move around (mobility aids) if they are needed. These  include: ?Canes. ?Walkers. ?Scooters. ?Crutches. ?Turn on the lights when you go into a dark area. Replace any light bulbs as soon as they burn out. ?Set up your furniture so you have a clear path. Avoid m

## 2021-07-21 DIAGNOSIS — D509 Iron deficiency anemia, unspecified: Secondary | ICD-10-CM | POA: Diagnosis not present

## 2021-07-21 DIAGNOSIS — I10 Essential (primary) hypertension: Secondary | ICD-10-CM | POA: Diagnosis not present

## 2021-11-13 ENCOUNTER — Inpatient Hospital Stay (HOSPITAL_COMMUNITY)
Admission: EM | Admit: 2021-11-13 | Discharge: 2021-11-15 | DRG: 064 | Disposition: A | Discharge status: Home Health | Payer: Medicare Other | Attending: Internal Medicine | Admitting: Internal Medicine

## 2021-11-13 ENCOUNTER — Emergency Department (HOSPITAL_COMMUNITY): Payer: Medicare Other

## 2021-11-13 ENCOUNTER — Other Ambulatory Visit: Payer: Self-pay

## 2021-11-13 DIAGNOSIS — N1832 Chronic kidney disease, stage 3b: Secondary | ICD-10-CM | POA: Diagnosis present

## 2021-11-13 DIAGNOSIS — Z9049 Acquired absence of other specified parts of digestive tract: Secondary | ICD-10-CM

## 2021-11-13 DIAGNOSIS — Z800 Family history of malignant neoplasm of digestive organs: Secondary | ICD-10-CM | POA: Diagnosis not present

## 2021-11-13 DIAGNOSIS — Z8249 Family history of ischemic heart disease and other diseases of the circulatory system: Secondary | ICD-10-CM | POA: Diagnosis not present

## 2021-11-13 DIAGNOSIS — Z20822 Contact with and (suspected) exposure to covid-19: Secondary | ICD-10-CM | POA: Diagnosis not present

## 2021-11-13 DIAGNOSIS — I6601 Occlusion and stenosis of right middle cerebral artery: Secondary | ICD-10-CM | POA: Diagnosis not present

## 2021-11-13 DIAGNOSIS — I6389 Other cerebral infarction: Secondary | ICD-10-CM | POA: Diagnosis not present

## 2021-11-13 DIAGNOSIS — Z885 Allergy status to narcotic agent status: Secondary | ICD-10-CM | POA: Diagnosis not present

## 2021-11-13 DIAGNOSIS — G9341 Metabolic encephalopathy: Secondary | ICD-10-CM | POA: Diagnosis present

## 2021-11-13 DIAGNOSIS — Z7982 Long term (current) use of aspirin: Secondary | ICD-10-CM

## 2021-11-13 DIAGNOSIS — R29700 NIHSS score 0: Secondary | ICD-10-CM | POA: Diagnosis present

## 2021-11-13 DIAGNOSIS — Z8542 Personal history of malignant neoplasm of other parts of uterus: Secondary | ICD-10-CM | POA: Diagnosis not present

## 2021-11-13 DIAGNOSIS — Z888 Allergy status to other drugs, medicaments and biological substances status: Secondary | ICD-10-CM | POA: Diagnosis not present

## 2021-11-13 DIAGNOSIS — E669 Obesity, unspecified: Secondary | ICD-10-CM | POA: Diagnosis present

## 2021-11-13 DIAGNOSIS — Z8673 Personal history of transient ischemic attack (TIA), and cerebral infarction without residual deficits: Secondary | ICD-10-CM

## 2021-11-13 DIAGNOSIS — I651 Occlusion and stenosis of basilar artery: Secondary | ICD-10-CM | POA: Diagnosis not present

## 2021-11-13 DIAGNOSIS — D72829 Elevated white blood cell count, unspecified: Secondary | ICD-10-CM | POA: Diagnosis present

## 2021-11-13 DIAGNOSIS — I639 Cerebral infarction, unspecified: Secondary | ICD-10-CM | POA: Diagnosis not present

## 2021-11-13 DIAGNOSIS — I2510 Atherosclerotic heart disease of native coronary artery without angina pectoris: Secondary | ICD-10-CM | POA: Diagnosis not present

## 2021-11-13 DIAGNOSIS — Z96653 Presence of artificial knee joint, bilateral: Secondary | ICD-10-CM | POA: Diagnosis present

## 2021-11-13 DIAGNOSIS — N179 Acute kidney failure, unspecified: Secondary | ICD-10-CM | POA: Diagnosis not present

## 2021-11-13 DIAGNOSIS — I63531 Cerebral infarction due to unspecified occlusion or stenosis of right posterior cerebral artery: Principal | ICD-10-CM | POA: Diagnosis present

## 2021-11-13 DIAGNOSIS — I10 Essential (primary) hypertension: Secondary | ICD-10-CM | POA: Diagnosis not present

## 2021-11-13 DIAGNOSIS — I6613 Occlusion and stenosis of bilateral anterior cerebral arteries: Secondary | ICD-10-CM | POA: Diagnosis not present

## 2021-11-13 DIAGNOSIS — E785 Hyperlipidemia, unspecified: Secondary | ICD-10-CM | POA: Diagnosis present

## 2021-11-13 DIAGNOSIS — K219 Gastro-esophageal reflux disease without esophagitis: Secondary | ICD-10-CM | POA: Diagnosis present

## 2021-11-13 DIAGNOSIS — I6623 Occlusion and stenosis of bilateral posterior cerebral arteries: Secondary | ICD-10-CM | POA: Diagnosis not present

## 2021-11-13 DIAGNOSIS — Z91048 Other nonmedicinal substance allergy status: Secondary | ICD-10-CM | POA: Diagnosis not present

## 2021-11-13 DIAGNOSIS — I129 Hypertensive chronic kidney disease with stage 1 through stage 4 chronic kidney disease, or unspecified chronic kidney disease: Secondary | ICD-10-CM | POA: Diagnosis not present

## 2021-11-13 DIAGNOSIS — Z043 Encounter for examination and observation following other accident: Secondary | ICD-10-CM | POA: Diagnosis not present

## 2021-11-13 DIAGNOSIS — Z823 Family history of stroke: Secondary | ICD-10-CM | POA: Diagnosis not present

## 2021-11-13 DIAGNOSIS — Z79899 Other long term (current) drug therapy: Secondary | ICD-10-CM

## 2021-11-13 DIAGNOSIS — Z981 Arthrodesis status: Secondary | ICD-10-CM | POA: Diagnosis not present

## 2021-11-13 DIAGNOSIS — E039 Hypothyroidism, unspecified: Secondary | ICD-10-CM | POA: Diagnosis not present

## 2021-11-13 DIAGNOSIS — Z955 Presence of coronary angioplasty implant and graft: Secondary | ICD-10-CM | POA: Diagnosis not present

## 2021-11-13 DIAGNOSIS — N183 Chronic kidney disease, stage 3 unspecified: Secondary | ICD-10-CM | POA: Diagnosis present

## 2021-11-13 DIAGNOSIS — H5340 Unspecified visual field defects: Secondary | ICD-10-CM | POA: Diagnosis present

## 2021-11-13 DIAGNOSIS — Z6835 Body mass index (BMI) 35.0-35.9, adult: Secondary | ICD-10-CM

## 2021-11-13 LAB — CBC WITH DIFFERENTIAL/PLATELET
Abs Immature Granulocytes: 0.04 10*3/uL (ref 0.00–0.07)
Basophils Absolute: 0.1 10*3/uL (ref 0.0–0.1)
Basophils Relative: 1 %
Eosinophils Absolute: 0.2 10*3/uL (ref 0.0–0.5)
Eosinophils Relative: 2 %
HCT: 39.2 % (ref 36.0–46.0)
Hemoglobin: 12.4 g/dL (ref 12.0–15.0)
Immature Granulocytes: 0 %
Lymphocytes Relative: 24 %
Lymphs Abs: 2.7 10*3/uL (ref 0.7–4.0)
MCH: 30.8 pg (ref 26.0–34.0)
MCHC: 31.6 g/dL (ref 30.0–36.0)
MCV: 97.5 fL (ref 80.0–100.0)
Monocytes Absolute: 1.3 10*3/uL — ABNORMAL HIGH (ref 0.1–1.0)
Monocytes Relative: 11 %
Neutro Abs: 7 10*3/uL (ref 1.7–7.7)
Neutrophils Relative %: 62 %
Platelets: 201 10*3/uL (ref 150–400)
RBC: 4.02 MIL/uL (ref 3.87–5.11)
RDW: 13.7 % (ref 11.5–15.5)
WBC: 11.2 10*3/uL — ABNORMAL HIGH (ref 4.0–10.5)
nRBC: 0 % (ref 0.0–0.2)

## 2021-11-13 NOTE — ED Triage Notes (Signed)
Pt BIB [facility] for feeling jittery x 1 month.  Pt also states she was thrown off of a lawnmower yesterday after hitting a hole with it.  No obvious injuries. EMS states she is A&O x4 but saying strange things.  Pt is A&O x4 for me  EMS states BP 170 palp and a HR if 90.

## 2021-11-13 NOTE — ED Provider Notes (Signed)
  [place] EMERGENCY DEPARTMENT Provider Note   CSN: [id number] Arrival date & time: 11/13/21  1937     History {Add pertinent medical, surgical, social history, OB history to HPI:1} No chief complaint on file.   Valerie Ramirez is a 88 y.o. female.  HPI     Home Medications Prior to Admission medications   Medication Sig Start Date End Date Taking? Authorizing Provider  aspirin EC 81 MG tablet Take 1 tablet (81 mg total) by mouth daily. 04/06/18   Kilroy, Doreene Burke, PA-C  BAYER BACK & BODY 500-32.5 MG TABS Take 1-3 tablets by mouth every 6 (six) hours as needed (for pain).    [provider]  Cholecalciferol (VITAMIN D-3) 25 MCG (1000 UT) CAPS Take 1,000 Units by mouth daily.    [provider]  Colchicine 0.6 MG CAPS Take 1 capsule by mouth in the morning and at bedtime. Patient taking differently: Take 0.6 mg by mouth in the morning and at bedtime. 08/28/20   Tower, Wynelle Fanny, MD  colchicine 0.6 MG tablet TAKE ONE TABLET BY MOUTH TWICE A DAY IN THE MORNING AND EVENING 03/17/21   Tower, Wynelle Fanny, MD  Cyanocobalamin (VITAMIN B-12 PO) Take 1 tablet by mouth daily.    [provider]  DULoxetine (CYMBALTA) 30 MG capsule TAKE ONE CAPSULE BY MOUTH DAILY 02/24/21   Tower, Wynelle Fanny, MD  Iron, Ferrous Sulfate, 325 (65 Fe) MG TABS Take 325 mg by mouth daily. 02/16/21   Lesleigh Noe, MD  multivitamin-iron-minerals-folic acid (CENTRUM) chewable tablet Chew 1 tablet by mouth daily. 02/10/21   Carmin Muskrat, MD  nitroGLYCERIN (NITROSTAT) 0.4 MG SL tablet Place 1 tablet (0.4 mg total) under the tongue every 5 (five) minutes x 3 doses as needed for chest pain. 04/09/17   [name], [name], PA-C      Allergies    Allopurinol, Lipitor [atorvastatin calcium], Morphine and related, Simvastatin, and Tape    Review of Systems   Review of Systems  Physical Exam Updated Vital Signs BP (!) 162/104   Pulse 91   Temp 98.6 F (37 C) (Oral)   Resp 16    SpO2 99%  Physical Exam  ED Results / Procedures / Treatments   Labs (all labs ordered are listed, but only abnormal results are displayed) Labs Reviewed - No data to display  EKG None  Radiology No results found.  Procedures Procedures  {Document cardiac monitor, telemetry assessment procedure when appropriate:1}  Medications Ordered in ED Medications - No data to display  ED Course/ Medical Decision Making/ A&P                           Medical Decision Making  ***  {Document critical care time when appropriate:1} {Document review of labs and clinical decision tools ie heart score, Chads2Vasc2 etc:1}  {Document your independent review of radiology images, and any outside records:1} {Document your discussion with family members, caretakers, and with consultants:1} {Document social determinants of health affecting pt's care:1} {Document your decision making why or why not admission, treatments were needed:1} Final Clinical Impression(s) / ED Diagnoses Final diagnoses:  None    Rx / DC Orders ED Discharge Orders     None

## 2021-11-14 ENCOUNTER — Inpatient Hospital Stay (HOSPITAL_COMMUNITY): Payer: Medicare Other

## 2021-11-14 ENCOUNTER — Other Ambulatory Visit (HOSPITAL_COMMUNITY): Payer: Medicare Other

## 2021-11-14 ENCOUNTER — Encounter (HOSPITAL_COMMUNITY): Payer: Self-pay | Admitting: Emergency Medicine

## 2021-11-14 DIAGNOSIS — E785 Hyperlipidemia, unspecified: Secondary | ICD-10-CM | POA: Diagnosis present

## 2021-11-14 DIAGNOSIS — I63531 Cerebral infarction due to unspecified occlusion or stenosis of right posterior cerebral artery: Secondary | ICD-10-CM | POA: Diagnosis present

## 2021-11-14 DIAGNOSIS — Z96653 Presence of artificial knee joint, bilateral: Secondary | ICD-10-CM | POA: Diagnosis present

## 2021-11-14 DIAGNOSIS — G9341 Metabolic encephalopathy: Secondary | ICD-10-CM | POA: Diagnosis present

## 2021-11-14 DIAGNOSIS — I639 Cerebral infarction, unspecified: Secondary | ICD-10-CM | POA: Diagnosis not present

## 2021-11-14 DIAGNOSIS — I6389 Other cerebral infarction: Secondary | ICD-10-CM

## 2021-11-14 DIAGNOSIS — Z9049 Acquired absence of other specified parts of digestive tract: Secondary | ICD-10-CM | POA: Diagnosis not present

## 2021-11-14 DIAGNOSIS — I129 Hypertensive chronic kidney disease with stage 1 through stage 4 chronic kidney disease, or unspecified chronic kidney disease: Secondary | ICD-10-CM | POA: Diagnosis present

## 2021-11-14 DIAGNOSIS — Z91048 Other nonmedicinal substance allergy status: Secondary | ICD-10-CM | POA: Diagnosis not present

## 2021-11-14 DIAGNOSIS — N179 Acute kidney failure, unspecified: Secondary | ICD-10-CM | POA: Diagnosis present

## 2021-11-14 DIAGNOSIS — I10 Essential (primary) hypertension: Secondary | ICD-10-CM | POA: Diagnosis not present

## 2021-11-14 DIAGNOSIS — R29700 NIHSS score 0: Secondary | ICD-10-CM | POA: Diagnosis present

## 2021-11-14 DIAGNOSIS — I2510 Atherosclerotic heart disease of native coronary artery without angina pectoris: Secondary | ICD-10-CM | POA: Diagnosis present

## 2021-11-14 DIAGNOSIS — N1832 Chronic kidney disease, stage 3b: Secondary | ICD-10-CM

## 2021-11-14 DIAGNOSIS — Z955 Presence of coronary angioplasty implant and graft: Secondary | ICD-10-CM | POA: Diagnosis not present

## 2021-11-14 DIAGNOSIS — Z823 Family history of stroke: Secondary | ICD-10-CM | POA: Diagnosis not present

## 2021-11-14 DIAGNOSIS — Z8249 Family history of ischemic heart disease and other diseases of the circulatory system: Secondary | ICD-10-CM | POA: Diagnosis not present

## 2021-11-14 DIAGNOSIS — Z20822 Contact with and (suspected) exposure to covid-19: Secondary | ICD-10-CM | POA: Diagnosis present

## 2021-11-14 DIAGNOSIS — D72829 Elevated white blood cell count, unspecified: Secondary | ICD-10-CM | POA: Diagnosis present

## 2021-11-14 DIAGNOSIS — E039 Hypothyroidism, unspecified: Secondary | ICD-10-CM

## 2021-11-14 DIAGNOSIS — Z888 Allergy status to other drugs, medicaments and biological substances status: Secondary | ICD-10-CM | POA: Diagnosis not present

## 2021-11-14 DIAGNOSIS — Z8542 Personal history of malignant neoplasm of other parts of uterus: Secondary | ICD-10-CM | POA: Diagnosis not present

## 2021-11-14 DIAGNOSIS — Z981 Arthrodesis status: Secondary | ICD-10-CM | POA: Diagnosis not present

## 2021-11-14 DIAGNOSIS — Z800 Family history of malignant neoplasm of digestive organs: Secondary | ICD-10-CM | POA: Diagnosis not present

## 2021-11-14 DIAGNOSIS — E669 Obesity, unspecified: Secondary | ICD-10-CM | POA: Diagnosis present

## 2021-11-14 DIAGNOSIS — Z885 Allergy status to narcotic agent status: Secondary | ICD-10-CM | POA: Diagnosis not present

## 2021-11-14 DIAGNOSIS — K219 Gastro-esophageal reflux disease without esophagitis: Secondary | ICD-10-CM | POA: Diagnosis present

## 2021-11-14 LAB — BASIC METABOLIC PANEL
Anion gap: 11 (ref 5–15)
BUN: 38 mg/dL — ABNORMAL HIGH (ref 8–23)
CO2: 23 mmol/L (ref 22–32)
Calcium: 9.1 mg/dL (ref 8.9–10.3)
Chloride: 104 mmol/L (ref 98–111)
Creatinine, Ser: 1.99 mg/dL — ABNORMAL HIGH (ref 0.44–1.00)
GFR, Estimated: 24 mL/min — ABNORMAL LOW (ref >60)
Glucose, Bld: 133 mg/dL — ABNORMAL HIGH (ref 70–99)
Potassium: 4.2 mmol/L (ref 3.5–5.1)
Sodium: 138 mmol/L (ref 135–145)

## 2021-11-14 LAB — CBC WITH DIFFERENTIAL/PLATELET
Abs Immature Granulocytes: 0.03 10*3/uL (ref 0.00–0.07)
Basophils Absolute: 0.1 10*3/uL (ref 0.0–0.1)
Basophils Relative: 1 %
Eosinophils Absolute: 0.2 10*3/uL (ref 0.0–0.5)
Eosinophils Relative: 3 %
HCT: 37.9 % (ref 36.0–46.0)
Hemoglobin: 12.5 g/dL (ref 12.0–15.0)
Immature Granulocytes: 0 %
Lymphocytes Relative: 26 %
Lymphs Abs: 2.4 10*3/uL (ref 0.7–4.0)
MCH: 31.2 pg (ref 26.0–34.0)
MCHC: 33 g/dL (ref 30.0–36.0)
MCV: 94.5 fL (ref 80.0–100.0)
Monocytes Absolute: 1 10*3/uL (ref 0.1–1.0)
Monocytes Relative: 11 %
Neutro Abs: 5.5 10*3/uL (ref 1.7–7.7)
Neutrophils Relative %: 59 %
Platelets: 193 10*3/uL (ref 150–400)
RBC: 4.01 MIL/uL (ref 3.87–5.11)
RDW: 13.6 % (ref 11.5–15.5)
WBC: 9.1 10*3/uL (ref 4.0–10.5)
nRBC: 0 % (ref 0.0–0.2)

## 2021-11-14 LAB — URINALYSIS, ROUTINE W REFLEX MICROSCOPIC
Bilirubin Urine: NEGATIVE
Glucose, UA: NEGATIVE mg/dL
Ketones, ur: NEGATIVE mg/dL
Leukocytes,Ua: NEGATIVE
Nitrite: NEGATIVE
Protein, ur: 100 mg/dL — AB
Specific Gravity, Urine: 1.013 (ref 1.005–1.030)
pH: 5 (ref 5.0–8.0)

## 2021-11-14 LAB — LIPID PANEL
Cholesterol: 239 mg/dL — ABNORMAL HIGH (ref 0–200)
HDL: 52 mg/dL (ref >40)
LDL Cholesterol: 155 mg/dL — ABNORMAL HIGH (ref 0–99)
Total CHOL/HDL Ratio: 4.6 RATIO
Triglycerides: 161 mg/dL — ABNORMAL HIGH (ref <150)
VLDL: 32 mg/dL (ref 0–40)

## 2021-11-14 LAB — COMPREHENSIVE METABOLIC PANEL
ALT: 17 U/L (ref 0–44)
AST: 27 U/L (ref 15–41)
Albumin: 3.6 g/dL (ref 3.5–5.0)
Alkaline Phosphatase: 98 U/L (ref 38–126)
Anion gap: 9 (ref 5–15)
BUN: 33 mg/dL — ABNORMAL HIGH (ref 8–23)
CO2: 23 mmol/L (ref 22–32)
Calcium: 9.1 mg/dL (ref 8.9–10.3)
Chloride: 107 mmol/L (ref 98–111)
Creatinine, Ser: 1.79 mg/dL — ABNORMAL HIGH (ref 0.44–1.00)
GFR, Estimated: 27 mL/min — ABNORMAL LOW (ref >60)
Glucose, Bld: 139 mg/dL — ABNORMAL HIGH (ref 70–99)
Potassium: 3.9 mmol/L (ref 3.5–5.1)
Sodium: 139 mmol/L (ref 135–145)
Total Bilirubin: 0.9 mg/dL (ref 0.3–1.2)
Total Protein: 6.7 g/dL (ref 6.5–8.1)

## 2021-11-14 LAB — RESP PANEL BY RT-PCR (FLU A&B, COVID) ARPGX2
Influenza A by PCR: NEGATIVE
Influenza B by PCR: NEGATIVE
SARS Coronavirus 2 by RT PCR: NEGATIVE

## 2021-11-14 LAB — ECHOCARDIOGRAM COMPLETE BUBBLE STUDY
AR max vel: 2.59 cm2
AV Area VTI: 2.51 cm2
AV Area mean vel: 2.53 cm2
AV Mean grad: 3 mmHg
AV Peak grad: 6.3 mmHg
Ao pk vel: 1.25 m/s
Area-P 1/2: 4.21 cm2
S' Lateral: 2.4 cm

## 2021-11-14 LAB — PROTIME-INR
INR: 1.1 (ref 0.8–1.2)
Prothrombin Time: 14.5 seconds (ref 11.4–15.2)

## 2021-11-14 LAB — HEMOGLOBIN A1C
Hgb A1c MFr Bld: 6.2 % — ABNORMAL HIGH (ref 4.8–5.6)
Mean Plasma Glucose: 131.24 mg/dL

## 2021-11-14 LAB — APTT: aPTT: 36 seconds (ref 24–36)

## 2021-11-14 LAB — PHOSPHORUS: Phosphorus: 4.5 mg/dL (ref 2.5–4.6)

## 2021-11-14 LAB — MAGNESIUM: Magnesium: 2.4 mg/dL (ref 1.7–2.4)

## 2021-11-14 MED ORDER — ROSUVASTATIN CALCIUM 20 MG PO TABS
20.0000 mg | ORAL_TABLET | Freq: Every day | ORAL | Status: DC
  Administered 2021-11-15: 20 mg via ORAL
  Filled 2021-11-14: qty 1

## 2021-11-14 MED ORDER — POLYETHYLENE GLYCOL 3350 17 G PO PACK: 17.0000 g | PACK | Freq: Every day | ORAL | Status: DC | PRN

## 2021-11-14 MED ORDER — LABETALOL HCL 5 MG/ML IV SOLN
5.0000 mg | INTRAVENOUS | Status: DC | PRN
Start: 2021-11-14 — End: 2021-11-15

## 2021-11-14 MED ORDER — MELATONIN 5 MG PO TABS: 5.0000 mg | ORAL_TABLET | Freq: Every evening | ORAL | Status: DC | PRN

## 2021-11-14 MED ORDER — SODIUM CHLORIDE 0.9 % IV SOLN
INTRAVENOUS | Status: AC
Start: 2021-11-14 — End: 2021-11-15

## 2021-11-14 MED ORDER — ASPIRIN 325 MG PO TABS
325.0000 mg | ORAL_TABLET | Freq: Every day | ORAL | Status: DC
  Administered 2021-11-14: 325 mg via ORAL
  Filled 2021-11-14: qty 1

## 2021-11-14 MED ORDER — ONDANSETRON HCL 4 MG/2ML IJ SOLN
4.0000 mg | Freq: Four times a day (QID) | INTRAMUSCULAR | Status: DC | PRN
Start: 2021-11-14 — End: 2021-11-15

## 2021-11-14 MED ORDER — ENOXAPARIN SODIUM 30 MG/0.3ML IJ SOSY
30.0000 mg | PREFILLED_SYRINGE | INTRAMUSCULAR | Status: DC
  Administered 2021-11-14 – 2021-11-15 (×2): 30 mg via SUBCUTANEOUS
  Filled 2021-11-14 (×2): qty 0.3

## 2021-11-14 MED ORDER — ACETAMINOPHEN 325 MG PO TABS
650.0000 mg | ORAL_TABLET | Freq: Four times a day (QID) | ORAL | Status: DC | PRN
  Administered 2021-11-14 – 2021-11-15 (×2): 650 mg via ORAL
  Filled 2021-11-14 (×2): qty 2

## 2021-11-14 MED ORDER — SODIUM CHLORIDE 0.9 % IV SOLN: INTRAVENOUS | Status: DC

## 2021-11-14 MED ORDER — CLOPIDOGREL BISULFATE 75 MG PO TABS
75.0000 mg | ORAL_TABLET | Freq: Every day | ORAL | Status: DC
  Administered 2021-11-14 – 2021-11-15 (×2): 75 mg via ORAL
  Filled 2021-11-14 (×2): qty 1

## 2021-11-14 MED ORDER — ACETAMINOPHEN 325 MG PO TABS
650.0000 mg | ORAL_TABLET | Freq: Once | ORAL | Status: AC
  Administered 2021-11-14: 650 mg via ORAL
  Filled 2021-11-14: qty 2

## 2021-11-14 MED ORDER — ATORVASTATIN CALCIUM 40 MG PO TABS: 40.0000 mg | ORAL_TABLET | Freq: Every day | ORAL | Status: DC

## 2021-11-14 MED ORDER — ASPIRIN 81 MG PO TBEC
81.0000 mg | DELAYED_RELEASE_TABLET | Freq: Every day | ORAL | Status: DC
  Administered 2021-11-14 – 2021-11-15 (×2): 81 mg via ORAL
  Filled 2021-11-14 (×2): qty 1

## 2021-11-14 MED ORDER — ROSUVASTATIN CALCIUM 5 MG PO TABS
10.0000 mg | ORAL_TABLET | Freq: Every day | ORAL | Status: DC
  Administered 2021-11-14: 10 mg via ORAL
  Filled 2021-11-14: qty 2

## 2021-11-14 MED ORDER — DULOXETINE HCL 30 MG PO CPEP
30.0000 mg | ORAL_CAPSULE | Freq: Every day | ORAL | Status: DC
  Administered 2021-11-14 – 2021-11-15 (×2): 30 mg via ORAL
  Filled 2021-11-14 (×2): qty 1

## 2021-11-14 MED ORDER — POLYETHYLENE GLYCOL 3350 17 G PO PACK
17.0000 g | PACK | Freq: Two times a day (BID) | ORAL | Status: DC
  Filled 2021-11-14 (×2): qty 1

## 2021-11-14 NOTE — Progress Notes (Signed)
STROKE TEAM PROGRESS NOTE   INTERVAL HISTORY No family at the bedside. She is alert, sitting up in bed reading the paper. Is noticing the left lower quadrant field cut, but can't remember when her vision changed. She fell off of her lawn mower and then had a tingling sensation that made her call EMS. Agreeable to not drive until she gets clearance from her PCP, eye doctor, and neurology outpatient. She does have her son and daughter close by.   Vitals:   11/14/21 0230 11/14/21 0245 11/14/21 0338 11/14/21 0827  BP: (!) 170/70 136/73 (!) 168/63 (!) 164/72  Pulse: 70 74 70 83  Resp: '11 11 18 18  '$ Temp:   98 F (36.7 C) 98 F (36.7 C)  TempSrc:   Oral Oral  SpO2: 99% 100% 99% 99%  Weight:      Height:       CBC:  Recent Labs  Lab 11/13/21 1956 11/14/21 0624  WBC 11.2* 9.1  NEUTROABS 7.0 5.5  HGB 12.4 12.5  HCT 39.2 37.9  MCV 97.5 94.5  PLT 201 956   Basic Metabolic Panel:  Recent Labs  Lab 11/13/21 1956 11/14/21 0624  NA 138 139  K 4.2 3.9  CL 104 107  CO2 23 23  GLUCOSE 133* 139*  BUN 38* 33*  CREATININE 1.99* 1.79*  CALCIUM 9.1 9.1  MG  --  2.4  PHOS  --  4.5   Lipid Panel:  Recent Labs  Lab 11/14/21 0624  CHOL 239*  TRIG 161*  HDL 52  CHOLHDL 4.6  VLDL 32  LDLCALC 155*   HgbA1c:  Recent Labs  Lab 11/14/21 0624  HGBA1C 6.2*   Urine Drug Screen: No results for input(s): "LABOPIA", "COCAINSCRNUR", "LABBENZ", "AMPHETMU", "THCU", "LABBARB" in the last 168 hours.  Alcohol Level No results for input(s): "ETH" in the last 168 hours.  IMAGING past 24 hours MR BRAIN WO CONTRAST  Result Date: 11/14/2021 CLINICAL DATA:  Initial evaluation for acute dizziness. EXAM: MRI HEAD WITHOUT CONTRAST TECHNIQUE: Multiplanar, multiecho pulse sequences of the brain and surrounding structures were obtained without intravenous contrast. COMPARISON:  Prior head CT from 11/13/2021 as well as earlier studies FINDINGS: Brain: Generalized age-related cerebral atrophy. Extensive  patchy and confluent T2/FLAIR hyperintensity involving the periventricular and deep white matter both cerebral hemispheres as well as the pons, most consistent with chronic small vessel ischemic disease, advanced in nature. Encephalomalacia and gliosis involving the right occipital lobe consistent with a chronic right PCA distribution infarct. Additional remote infarct noted at the right frontal corona radiata. Few probable small remote lacunar infarcts about the bilateral basal ganglia. Scattered patchy restricted diffusion involving the right splenium and adjacent right occipital lobe, consistent with acute right PCA distribution infarct (series 5, images 75, 72, 69). Probable faint patchy involvement of the right thalamus noted as well (series 5, image 76). This is superimposed on the underlying chronic right PCA territory infarct. Furthermore, there are multiple additional scattered acute to early subacute ischemic infarcts involving the bilateral cerebral hemispheres (series 5, images 85, 83, 82, 74, 71). No associated hemorrhage or mass effect about these infarcts. No acute intracranial hemorrhage. Chronic micro hemorrhages involving the right occipital lobe and left cerebellum noted, stable. Additional chronic micro hemorrhages involving the right thalamus are new as compared to prior MRI from 2021. No mass lesion, midline shift or mass effect. No hydrocephalus or extra-axial fluid collection. Empty sella noted. Vascular: Major intracranial vascular flow voids are maintained. Skull and upper cervical spine:  Craniocervical junction within normal limits. Bone marrow signal intensity within normal limits. Prominent degenerative spondylosis noted within the visualized upper cervical spine without significant spinal stenosis. No scalp soft tissue abnormality. Sinuses/Orbits: Prior bilateral ocular lens replacement. Paranasal sinuses are largely clear. Trace bilateral mastoid effusions noted, of doubtful  significance. Other: None. IMPRESSION: 1. Patchy small volume acute nonhemorrhagic right PCA distribution infarct as above. This is superimposed on an underlying chronic right PCA territory infarct. 2. Additional scattered subcentimeter acute to early subacute nonhemorrhagic ischemic infarcts involving the bilateral cerebral hemispheres as above. 3. Underlying age-related cerebral atrophy with advanced chronic microvascular ischemic disease, with a few scattered remote ischemic infarcts as above. Electronically Signed   By: Jeannine Boga M.D.   On: 11/14/2021 01:00   CT Head Wo Contrast  Result Date: 11/13/2021 CLINICAL DATA:  Fall off lawnmower EXAM: CT HEAD WITHOUT CONTRAST TECHNIQUE: Contiguous axial images were obtained from the base of the skull through the vertex without intravenous contrast. RADIATION DOSE REDUCTION: This exam was performed according to the departmental dose-optimization program which includes automated exposure control, adjustment of the mA and/or kV according to patient size and/or use of iterative reconstruction technique. COMPARISON:  06/24/2021 FINDINGS: Brain: No evidence of acute infarction, hemorrhage, hydrocephalus, extra-axial collection or mass lesion/mass effect. Old right occipital infarct.  Old right corona radiata infarct. Extensive subcortical white matter and periventricular small vessel ischemic changes. Asymmetric hyperdensity along the left falx (series 3/image 20) is unchanged from the prior and favored to reflect a vascular calcification. Vascular: Intracranial atherosclerosis. Skull: Normal. Negative for fracture or focal lesion. Sinuses/Orbits: The visualized paranasal sinuses are essentially clear. The mastoid air cells are unopacified. Other: None. IMPRESSION: No evidence of acute intracranial abnormality. Old right occipital and right corona radiata infarcts. Extensive small vessel ischemic changes. Electronically Signed   By: Julian Hy M.D.   On:  11/13/2021 22:11    PHYSICAL EXAM Neurological Examination Mental Status: Awake and alert. Ox3 . Able to follow simple commands.  Cranial Nerves: II: Left visual field cut. PERRL.   III,IV, VI: No ptosis. EOMI.  V: Temp sensation equal bilaterally  VII: Smile symmetric VIII: HOH IX,X: No hoarseness XI: Symmetric shoulder shrug XII: Midline tongue extension Motor: BUE 5/5 proximally and distally BLE 5/5 proximally and distally  No pronator drift.  Sensory: Temp and light touch intact throughout, bilaterally. Positive for extinction to LUE with DSS.  Deep Tendon Reflexes: 2+ and symmetric throughout Cerebellar: No ataxia with FNF bilaterally but has difficulty with targeting on the left in the context of visual field cut.   Gait: Deferred  ASSESSMENT/PLAN Ms. ADVIKA MCLELLAND is a 88 y.o. female with history of   arthritis, CAD s/p stenting, endometrial cancer, kidney stones, HTN, HLD and hypothyroidism who presented to the ED on Friday after she was thrown off her lawnmower the day before. She does not know when her vision changed. She does have an ophthalmologist that her daughter made her an appointment with.   Stroke: Acute on chronic right PCA infarct  Etiology:  due to atherosclerosis and large vessel stenosis Code Stroke CT head No acute abnormality.   MRI  Patchy small volume acute nonhemorrhagic right PCA distribution infarct as above. This is superimposed on an underlying chronic right PCA territory infarct. MRA  Poor flow in the distal right PCA branches. Intracranial atherosclerosis with other significant stenoses: - Moderate stenosis of the mid Right MCA M1.- Moderate to severe bilateral ACA A2 stenoses.- Severe Left PCA P3 branch  stenosis. Carotid Doppler near normal with minimal thickening/plaque bilaterally 2D Echo  EF 60-65% LDL 155 HgbA1c 6.2 VTE prophylaxis - Lovenox    Diet   Diet regular Room service appropriate? Yes; Fluid consistency: Thin   aspirin 81 mg  daily prior to admission, now on aspirin 81 mg daily and clopidogrel 75 mg daily for 3 weels and then plavix alone Therapy recommendations:  Home health PT/OT Disposition:  pending  Hypertension Stable Permissive hypertension (OK if < 220/120) but gradually normalize in 5-7 days Long-term BP goal normotensive  Hyperlipidemia LDL 155, goal < 70 Add Cretor '20mg'$   Continue statin at discharge  Other Stroke Risk Factors Advanced Age >/= 82  Obesity, Body mass index is 35.4 kg/m., BMI >/= 30 associated with increased stroke risk, recommend weight loss, diet and exercise as appropriate  Hx stroke/TIA Coronary artery disease s/p stenting  Other Active Problems CKD stage IIIb Baseline 1.4 Acute metabolic encephalopathy  Hospital day # 0  Patient seen and examined by NP/APP with MD. MD to update note as needed.   Janine Ores, DNP, FNP-BC [facility] Pager: [phone]   To contact Stroke Continuity provider, please refer to [email or web address] After hours, contact General Neurology

## 2021-11-14 NOTE — Evaluation (Signed)
Clinical/Bedside Swallow Evaluation Patient Details  Name: NAN MAYA MRN: [id number] Date of Birth: Dec 25, 1932  Today's Date: 11/14/2021 Time: SLP Start Time (ACUTE ONLY): 0930 SLP Stop Time (ACUTE ONLY): 0945 SLP Time Calculation (min) (ACUTE ONLY): 15 min  Past Medical History:  Past Medical History:  Diagnosis Date   Arthritis    OA   CAD (coronary artery disease)    stent December 2018   Cancer [facility])    endometrial   GERD (gastroesophageal reflux disease)    Hearing loss    History of kidney stones    Hyperlipidemia    Hypertension    Hypothyroid    "not in years"   Shortness of breath dyspnea    04/25/17- not anymore   Past Surgical History:  Past Surgical History:  Procedure Laterality Date   ABDOMINAL HYSTERECTOMY     ANTERIOR CERVICAL DECOMP/DISCECTOMY FUSION [name] 08/18/2015   Procedure: C5-6, C6-7 Anterior Cervical Discectomy and Fusion, Allograft, Plate;  Surgeon: Marybelle Killings, MD;  Location: [place];  Service: Orthopedics;  Laterality: [name];   bone spur removed Right    shoulder   BOWEL RESECTION  07/04/2012   Procedure: SMALL BOWEL RESECTION;  Surgeon: Alvino Chapel, MD;  Location: [facility];  Service: Gynecology;;   BREAST BIOPSY     CHOLECYSTECTOMY     CORONARY STENT INTERVENTION [name] 04/06/2017   Procedure: CORONARY STENT INTERVENTION;  Surgeon: Wellington Hampshire, MD;  Location: [place] [facility];  Service: Cardiovascular;  Laterality: [name];   DILATION AND CURETTAGE OF UTERUS  1999   EYE SURGERY Bilateral    cataracts   FALSE ANEURYSM REPAIR Right 04/26/2017   Procedure: 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;  Surgeon: Conrad Guthrie, MD;  Location: [place];  Service: Vascular;  Laterality: Right;   HAND SURGERY  12/2008   after hand fx/due to fall   JOINT REPLACEMENT Bilateral 07/1998   knees   KNEE ARTHROPLASTY  05/23/1998   total right   LAPAROTOMY Bilateral 07/04/2012   Procedure: EXPLORATORY LAPAROTOMY TOTAL ABDOMINAL HYSTERECTOMY  BILATERAL SALPINGO-OOPHORECTOMY with small bowel resection ;  Surgeon: Alvino Chapel, MD;  Location: [facility];  Service: Gynecology;  Laterality: Bilateral;   LEFT HEART CATH AND CORONARY ANGIOGRAPHY [name] 04/06/2017   Procedure: LEFT HEART CATH AND CORONARY ANGIOGRAPHY;  Surgeon: Jolaine Artist, MD;  Location: [place] [facility];  Service: Cardiovascular;  Laterality: [name];   polyp removal  1999   TUBAL LIGATION     HPI:  Patient is an 88 y.o. female with PMH: CAD s/p PCI with stenting, HTN, hypothyroidism, HLD, prior CVA. She presented to the hospital on 11/14/21 for evaluation of generalized weakness and associated left peripheral vision changes. In ED, CT head revealed old right occipital and right corona radiata infarcts and extensive small vessel ischemic changes but no evidence of acute intracranial injury. MRI revealed patchy small volume acute nonhemorrhagic right PCA distribution infart and acute to early subacute nonhemorrhagic ischemic infarct involving the bilateral cerebral hemispheres.    Assessment / Plan / Recommendation  Clinical Impression  Patient not currently presenting with clinical s/s of dysphagia as per this bedside/clinical swallow evaluation. She does have h/o of GERD and SLP observed her to have a belch as well as throat clearing which occured several minutes after eating PO's. No change in patient's vocal quality during or after PO intake. Patient reported that after she has eaten she feels like the right corner of her mouth gets pulled, "  like someone has a rubberband and they're pulling on it". SLP did not observe any significant oral motor weakness and patient denies any numbness. She reports h/o difficulty with grits and that she will often "spit it out in the yard" when it feels like its stuck in her throat. SLP is not recommending continued intervention for swallow function. SLP Visit Diagnosis: Dysphagia, unspecified (R13.10)    Aspiration Risk  No  limitations    Diet Recommendation Regular;Thin liquid   Liquid Administration via: Cup;Straw Medication Administration: Whole meds with liquid Supervision: Patient able to self feed Compensations: Slow rate;Small sips/bites Postural Changes: Seated upright at 90 degrees    Other  Recommendations Oral Care Recommendations: Oral care BID;Patient independent with oral care    Recommendations for follow up therapy are one component of a multi-disciplinary discharge planning process, led by the attending physician.  Recommendations may be updated based on patient status, additional functional criteria and insurance authorization.  Follow up Recommendations No SLP follow up      Assistance Recommended at Discharge None  Functional Status Assessment Patient has had a recent decline in their functional status and demonstrates the ability to make significant improvements in function in a reasonable and predictable amount of time.  Frequency and Duration            Prognosis        Swallow Study   General Date of Onset: 11/14/21 HPI: Patient is an 88 y.o. female with PMH: CAD s/p PCI with stenting, HTN, hypothyroidism, HLD, prior CVA. She presented to the hospital on 11/14/21 for evaluation of generalized weakness and associated left peripheral vision changes. In ED, CT head revealed old right occipital and right corona radiata infarcts and extensive small vessel ischemic changes but no evidence of acute intracranial injury. MRI revealed patchy small volume acute nonhemorrhagic right PCA distribution infart and acute to early subacute nonhemorrhagic ischemic infarct involving the bilateral cerebral hemispheres. Type of Study: Bedside Swallow Evaluation Previous Swallow Assessment: none found Diet Prior to this Study: Regular;Thin liquids Temperature Spikes Noted: No Respiratory Status: Room air History of Recent Intubation: No Behavior/Cognition: Alert;Cooperative;Pleasant mood Oral  Cavity Assessment: Within Functional Limits Oral Care Completed by SLP: No Oral Cavity - Dentition: Adequate natural dentition Vision: Functional for self-feeding Self-Feeding Abilities: Able to feed self Patient Positioning: Upright in bed Baseline Vocal Quality: Normal Volitional Cough: Strong Volitional Swallow: Able to elicit    Oral/Motor/Sensory Function Overall Oral Motor/Sensory Function: Other (comment) (Patient reports feeling of right corner of mouth feeling like its being pulled after she eats. SLP did not observe any oral motor deficits)   Ice Chips     Thin Liquid Thin Liquid: Within functional limits Presentation: Self Fed;Straw    Nectar Thick     Honey Thick     Puree Puree: Within functional limits   Solid           Sonia Baller, MA, CCC-SLP Speech Therapy

## 2021-11-14 NOTE — Progress Notes (Signed)
  Echocardiogram 2D Echocardiogram has been performed.  Merrie Roof F 11/14/2021, 12:55 PM

## 2021-11-14 NOTE — Plan of Care (Signed)

## 2021-11-14 NOTE — Evaluation (Signed)
Occupational Therapy Evaluation Patient Details Name: Valerie Ramirez MRN: [id number] DOB: Jul 03, 1932 Today's Date: 11/14/2021   History of Present Illness Pt is an 88 y/o F presenting to ED for weakness and L visual field impairments. CT revealing old R occipital and R corona radiata infarcts. MRI revealing patchy small volume acute nonhemorrhagic R PCA distrubution infarcts. PMH includes CAD s/p DES Lcx 2018, HTN, hypothyroid, HLD, prior CVA   Clinical Impression   Pt independent at baseline with ADLs and functional mobility, lives alone but daughter is normally present and assisting. Pt currently min A for ADLs, min guard for bed mobility, and min guard-min A for transfers without AD. Pt with L sided visual field deficits, not tracking superiorly/inferiorly on L side or attending to stimuli on L side during visual assessment and in session. Pt presenting with impairments listed below, will follow acutely. Recommend HHOT at d/c.      Recommendations for follow up therapy are one component of a multi-disciplinary discharge planning process, led by the attending physician.  Recommendations may be updated based on patient status, additional functional criteria and insurance authorization.   Follow Up Recommendations  Home health OT    Assistance Recommended at Discharge Intermittent Supervision/Assistance  Patient can return home with the following A little help with bathing/dressing/bathroom;Assistance with cooking/housework;Direct supervision/assist for medications management;Direct supervision/assist for financial management;Help with stairs or ramp for entrance;Assist for transportation    Functional Status Assessment  Patient has had a recent decline in their functional status and demonstrates the ability to make significant improvements in function in a reasonable and predictable amount of time.  Equipment Recommendations  None recommended by OT (pt has all needed DME)     Recommendations for Other Services PT consult     Precautions / Restrictions Precautions Precautions: Fall Restrictions Weight Bearing Restrictions: No      Mobility Bed Mobility Overal bed mobility: Needs Assistance Bed Mobility: Supine to Sit     Supine to sit: Min guard     General bed mobility comments: increased time    Transfers Overall transfer level: Needs assistance Equipment used: None Transfers: Sit to/from Stand Sit to Stand: Min guard, Min assist                  Balance Overall balance assessment: Needs assistance Sitting-balance support: Feet supported Sitting balance-Leahy Scale: Good     Standing balance support: Bilateral upper extremity supported, During functional activity Standing balance-Leahy Scale: Good                             ADL either performed or assessed with clinical judgement   ADL Overall ADL's : Needs assistance/impaired Eating/Feeding: Set up;Sitting   Grooming: Minimal assistance;Sitting;Standing   Upper Body Bathing: Minimal assistance;Sitting;Standing   Lower Body Bathing: Minimal assistance;Sitting/lateral leans   Upper Body Dressing : Minimal assistance;Sitting;Standing   Lower Body Dressing: Minimal assistance;Sitting/lateral leans;Sit to/from stand   Toilet Transfer: Min guard;Ambulation;Regular Glass blower/designer Details (indicate cue type and reason): simulated in room Toileting- Clothing Manipulation and Hygiene: Minimal assistance;Sitting/lateral lean;Sit to/from stand       Functional mobility during ADLs: Minimal assistance       Vision Baseline Vision/History: 1 Wears glasses Ability to See in Adequate Light: 2 Moderately impaired Patient Visual Report: Peripheral vision impairment Vision Assessment?: Yes;Vision impaired- to be further tested in functional context Eye Alignment: Within Functional Limits Ocular Range of Motion: Restricted on the left;Restricted looking  up;Restricted looking down;Impaired-to be further tested in functional context Alignment/Gaze Preference: Gaze right;Head turned (mild) Tracking/Visual Pursuits: Decreased smoothness of eye movement to RIGHT superior field;Decreased smoothness of horizontal tracking;Decreased smoothness of vertical tracking;Decreased smoothness of eye movement to RIGHT inferior field;Impaired - to be further tested in functional context Saccades: Impaired - to be further tested in functional context;Additional head turns occurred during testing;Additional eye shifts occurred during testing Visual Fields: Left visual field deficit;Impaired-to be further tested in functional context Additional Comments: pt not attending to stimuli in L peripheral visuall field, recognizes objects in L visual field when they are closer to midline     Perception Perception Comments: difficulty finding objects on L side of sink during standing grooming task   Praxis      Pertinent Vitals/Pain Pain Assessment Pain Assessment: No/denies pain     Hand Dominance Right   Extremity/Trunk Assessment Upper Extremity Assessment Upper Extremity Assessment: Overall WFL for tasks assessed   Lower Extremity Assessment Lower Extremity Assessment: Defer to PT evaluation   Cervical / Trunk Assessment Cervical / Trunk Assessment: Normal   Communication Communication Communication: No difficulties   Cognition Arousal/Alertness: Awake/alert Behavior During Therapy: WFL for tasks assessed/performed, Restless, Impulsive Overall Cognitive Status: Within Functional Limits for tasks assessed                                 General Comments: pt tangential in conversation, impulsive, however follows commands appropriately, A & O x4     General Comments  VSS on RA, son present in room during session    Exercises     Shoulder Instructions      Home Living Family/patient expects to be discharged to:: Private  residence Living Arrangements: Alone (son reports daughter lives there part time) Available Help at Discharge: Family Type of Home: House Home Access: Stairs to enter Technical brewer of Steps: 4 Entrance Stairs-Rails: Right Home Layout: One level     Bathroom Shower/Tub: Walk-in shower;Door   [employer] Toilet: Standard Bathroom Accessibility: Yes   Home Equipment: Conservation officer, nature (2 wheels);Cane - quad;Wheelchair - manual;Shower seat - built in;Shower seat          Prior Functioning/Environment Prior Level of Function : Independent/Modified Independent;Driving             Mobility Comments: no AD use ADLs Comments: ind with ADLs;        OT Problem List: Decreased strength;Decreased range of motion;Decreased activity tolerance;Impaired vision/perception;Decreased coordination;Decreased cognition;Decreased safety awareness;Impaired balance (sitting and/or standing)      OT Treatment/Interventions: Self-care/ADL training;Therapeutic exercise;Therapeutic activities;Patient/family education;Balance training;Visual/perceptual remediation/compensation;Energy conservation;Cognitive remediation/compensation    OT Goals(Current goals can be found in the care plan section) Acute Rehab OT Goals Patient Stated Goal: to go home OT Goal Formulation: With patient Time For Goal Achievement: 11/28/21 Potential to Achieve Goals: Good ADL Goals Pt Will Perform Upper Body Dressing: Independently;sitting;standing Pt Will Perform Lower Body Dressing: Independently;sit to/from stand;sitting/lateral leans Pt Will Transfer to Toilet: Independently;ambulating;regular height toilet Additional ADL Goal #1: Pt will verbalize 3 low vision strategies in prep for ADLs  OT Frequency: Min 2X/week    Co-evaluation              AM-PAC OT "6 Clicks" Daily Activity     Outcome Measure Help from another person eating meals?: None Help from another person taking care of personal grooming?: A  Little Help from another person toileting, which includes using toliet, bedpan,  or urinal?: A Little Help from another person bathing (including washing, rinsing, drying)?: A Little Help from another person to put on and taking off regular upper body clothing?: A Little Help from another person to put on and taking off regular lower body clothing?: A Little 6 Click Score: 19   End of Session Equipment Utilized During Treatment: Gait belt Nurse Communication: Mobility status  Activity Tolerance: Patient tolerated treatment well Patient left: Other (comment) (sitting EOB handoff to PT)  OT Visit Diagnosis: Unsteadiness on feet (R26.81);Other abnormalities of gait and mobility (R26.89);Muscle weakness (generalized) (M62.81);Other symptoms and signs involving cognitive function;Low vision, both eyes (H54.2)                Time: 1224-8250 OT Time Calculation (min): 23 min Charges:  OT General Charges $OT Visit: 1 Visit OT Evaluation $OT Eval Moderate Complexity: 1 Mod OT Treatments $Self Care/Home Management : 8-22 mins  Lynnda Child, OTD, OTR/L Acute Rehab [phone]) [phone] [place] 11/14/2021, 9:43 AM

## 2021-11-14 NOTE — ED Notes (Signed)
ED TO INPATIENT HANDOFF REPORT  ED Nurse Name and Phone #: Caryl Pina RN [phone]  S Name/Age/Gender Valerie Ramirez 88 y.o. female Room/Bed: 020C/020C  Code Status   Code Status: Prior  Home/SNF/Other Home Patient oriented to: self, place, time, and situation Is this baseline? Yes   Triage Complete: Triage complete  Chief Complaint Acute CVA (cerebrovascular accident) [facility]) [I63.9]  Triage Note Pt BIB [facility] for feeling jittery x 1 month.  Pt also states she was thrown off of a lawnmower yesterday after hitting a hole with it.  No obvious injuries. EMS states she is A&O x4 but saying strange things.  Pt is A&O x4 for me  EMS states BP 170 palp and a HR if 90.   Allergies Allergies  Allergen Reactions   Allopurinol Nausea Only   Lipitor [Atorvastatin Calcium] Itching and Other (See Comments)    Leg aches and aching all over   Morphine And Related Other (See Comments)    Makes the patient "loopy,"    Simvastatin Other (See Comments)    MUSCLE PAIN   Tape Itching, Rash and Other (See Comments)    "Cannot use paper tape" --- also causes blisters. Use plastic tape    Level of Care/Admitting Diagnosis ED Disposition     ED Disposition  Admit   Condition  --   Comment  Hospital Area: [facility] [[id number]]  Level of Care: Telemetry Medical [104]  May admit patient to [facility] or [facility] if equivalent level of care is available:: No  Covid Evaluation: Asymptomatic - no recent exposure (last 10 days) testing not required  Diagnosis: Acute CVA (cerebrovascular accident) [facility]) [[id number]]  Admitting Physician: Kayleen Memos [[id number]]  Attending Physician: Kayleen Memos [[id number]]  Certification:: I certify this patient will need inpatient services for at least 2 midnights  Estimated Length of Stay: 2          B Medical/Surgery History Past Medical History:  Diagnosis Date   Arthritis    OA   CAD (coronary artery disease)    stent December  2018   Cancer [facility])    endometrial   GERD (gastroesophageal reflux disease)    Hearing loss    History of kidney stones    Hyperlipidemia    Hypertension    Hypothyroid    "not in years"   Shortness of breath dyspnea    04/25/17- not anymore   Past Surgical History:  Procedure Laterality Date   ABDOMINAL HYSTERECTOMY     ANTERIOR CERVICAL DECOMP/DISCECTOMY FUSION [name] 08/18/2015   Procedure: C5-6, C6-7 Anterior Cervical Discectomy and Fusion, Allograft, Plate;  Surgeon: Marybelle Killings, MD;  Location: [place];  Service: Orthopedics;  Laterality: [name];   bone spur removed Right    shoulder   BOWEL RESECTION  07/04/2012   Procedure: SMALL BOWEL RESECTION;  Surgeon: Alvino Chapel, MD;  Location: [facility];  Service: Gynecology;;   BREAST BIOPSY     CHOLECYSTECTOMY     CORONARY STENT INTERVENTION [name] 04/06/2017   Procedure: CORONARY STENT INTERVENTION;  Surgeon: Wellington Hampshire, MD;  Location: [place] [facility];  Service: Cardiovascular;  Laterality: [name];   DILATION AND CURETTAGE OF UTERUS  1999   EYE SURGERY Bilateral    cataracts   FALSE ANEURYSM REPAIR Right 04/26/2017   Procedure: 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;  Surgeon: Conrad [place], MD;  Location: [place];  Service: Vascular;  Laterality: Right;   HAND SURGERY  12/2008   after hand fx/due to fall   JOINT REPLACEMENT Bilateral 07/1998   knees   KNEE ARTHROPLASTY  05/23/1998   total right   LAPAROTOMY Bilateral 07/04/2012   Procedure: EXPLORATORY LAPAROTOMY TOTAL ABDOMINAL HYSTERECTOMY BILATERAL SALPINGO-OOPHORECTOMY with small bowel resection ;  Surgeon: Alvino Chapel, MD;  Location: [facility];  Service: Gynecology;  Laterality: Bilateral;   LEFT HEART CATH AND CORONARY ANGIOGRAPHY [name] 04/06/2017   Procedure: LEFT HEART CATH AND CORONARY ANGIOGRAPHY;  Surgeon: Jolaine Artist, MD;  Location: [place] [facility];  Service: Cardiovascular;  Laterality: [name];   polyp removal  1999   TUBAL LIGATION        A IV Location/Drains/Wounds Patient Lines/Drains/Airways Status     Active Line/Drains/Airways     Name Placement date Placement time Site Days   Peripheral IV 11/13/21 20 G Left Antecubital 11/13/21  2200  Antecubital  1   Incision (Closed) 04/26/17 Arm Right 04/26/17  0904  -- 1663   Wound / Incision (Open or Dehisced) 09/18/17 Other (Comment);Incision - Dehisced Thigh 09/18/17  2000  Thigh  1518            Intake/Output Last 24 hours No intake or output data in the 24 hours ending 11/14/21 0251  Labs/Imaging Results for orders placed or performed during the hospital encounter of 11/13/21 (from the past 48 hour(s))  CBC with Differential     Status: Abnormal   Collection Time: 11/13/21  7:56 PM  Result Value Ref Range   WBC 11.2 (H) 4.0 - 10.5 K/uL   RBC 4.02 3.87 - 5.11 MIL/uL   Hemoglobin 12.4 12.0 - 15.0 g/dL   HCT 39.2 36.0 - 46.0 %   MCV 97.5 80.0 - 100.0 fL   MCH 30.8 26.0 - 34.0 pg   MCHC 31.6 30.0 - 36.0 g/dL   RDW 13.7 11.5 - 15.5 %   Platelets 201 150 - 400 K/uL   nRBC 0.0 0.0 - 0.2 %   Neutrophils Relative % 62 %   Neutro Abs 7.0 1.7 - 7.7 K/uL   Lymphocytes Relative 24 %   Lymphs Abs 2.7 0.7 - 4.0 K/uL   Monocytes Relative 11 %   Monocytes Absolute 1.3 (H) 0.1 - 1.0 K/uL   Eosinophils Relative 2 %   Eosinophils Absolute 0.2 0.0 - 0.5 K/uL   Basophils Relative 1 %   Basophils Absolute 0.1 0.0 - 0.1 K/uL   Immature Granulocytes 0 %   Abs Immature Granulocytes 0.04 0.00 - 0.07 K/uL    Comment: Performed at [facility], [address], [place], [place] [address]  Basic metabolic panel     Status: Abnormal   Collection Time: 11/13/21  7:56 PM  Result Value Ref Range   Sodium 138 135 - 145 mmol/L   Potassium 4.2 3.5 - 5.1 mmol/L   Chloride 104 98 - 111 mmol/L   CO2 23 22 - 32 mmol/L   Glucose, Bld 133 (H) 70 - 99 mg/dL    Comment: Glucose reference range applies only to samples taken after fasting for at least 8 hours.   BUN 38 (H) 8 - 23  mg/dL   Creatinine, Ser 1.99 (H) 0.44 - 1.00 mg/dL   Calcium 9.1 8.9 - 10.3 mg/dL   GFR, Estimated 24 (L) >60 mL/min    Comment: (NOTE) Calculated using the CKD-EPI Creatinine Equation (2021)    Anion gap 11 5 - 15    Comment: Performed at [facility]  Lab, [address], [place], [place] [address]   MR BRAIN WO CONTRAST  Result Date: 11/14/2021 CLINICAL DATA:  Initial evaluation for acute dizziness. EXAM: MRI HEAD WITHOUT CONTRAST TECHNIQUE: Multiplanar, multiecho pulse sequences of the brain and surrounding structures were obtained without intravenous contrast. COMPARISON:  Prior head CT from 11/13/2021 as well as earlier studies FINDINGS: Brain: Generalized age-related cerebral atrophy. Extensive patchy and confluent T2/FLAIR hyperintensity involving the periventricular and deep white matter both cerebral hemispheres as well as the pons, most consistent with chronic small vessel ischemic disease, advanced in nature. Encephalomalacia and gliosis involving the right occipital lobe consistent with a chronic right PCA distribution infarct. Additional remote infarct noted at the right frontal corona radiata. Few probable small remote lacunar infarcts about the bilateral basal ganglia. Scattered patchy restricted diffusion involving the right splenium and adjacent right occipital lobe, consistent with acute right PCA distribution infarct (series 5, images 75, 72, 69). Probable faint patchy involvement of the right thalamus noted as well (series 5, image 76). This is superimposed on the underlying chronic right PCA territory infarct. Furthermore, there are multiple additional scattered acute to early subacute ischemic infarcts involving the bilateral cerebral hemispheres (series 5, images 85, 83, 82, 74, 71). No associated hemorrhage or mass effect about these infarcts. No acute intracranial hemorrhage. Chronic micro hemorrhages involving the right occipital lobe and left cerebellum noted, stable.  Additional chronic micro hemorrhages involving the right thalamus are new as compared to prior MRI from 2021. No mass lesion, midline shift or mass effect. No hydrocephalus or extra-axial fluid collection. Empty sella noted. Vascular: Major intracranial vascular flow voids are maintained. Skull and upper cervical spine: Craniocervical junction within normal limits. Bone marrow signal intensity within normal limits. Prominent degenerative spondylosis noted within the visualized upper cervical spine without significant spinal stenosis. No scalp soft tissue abnormality. Sinuses/Orbits: Prior bilateral ocular lens replacement. Paranasal sinuses are largely clear. Trace bilateral mastoid effusions noted, of doubtful significance. Other: None. IMPRESSION: 1. Patchy small volume acute nonhemorrhagic right PCA distribution infarct as above. This is superimposed on an underlying chronic right PCA territory infarct. 2. Additional scattered subcentimeter acute to early subacute nonhemorrhagic ischemic infarcts involving the bilateral cerebral hemispheres as above. 3. Underlying age-related cerebral atrophy with advanced chronic microvascular ischemic disease, with a few scattered remote ischemic infarcts as above. Electronically Signed   By: Jeannine Boga M.D.   On: 11/14/2021 01:00   CT Head Wo Contrast  Result Date: 11/13/2021 CLINICAL DATA:  Fall off lawnmower EXAM: CT HEAD WITHOUT CONTRAST TECHNIQUE: Contiguous axial images were obtained from the base of the skull through the vertex without intravenous contrast. RADIATION DOSE REDUCTION: This exam was performed according to the departmental dose-optimization program which includes automated exposure control, adjustment of the mA and/or kV according to patient size and/or use of iterative reconstruction technique. COMPARISON:  06/24/2021 FINDINGS: Brain: No evidence of acute infarction, hemorrhage, hydrocephalus, extra-axial collection or mass lesion/mass effect.  Old right occipital infarct.  Old right corona radiata infarct. Extensive subcortical white matter and periventricular small vessel ischemic changes. Asymmetric hyperdensity along the left falx (series 3/image 20) is unchanged from the prior and favored to reflect a vascular calcification. Vascular: Intracranial atherosclerosis. Skull: Normal. Negative for fracture or focal lesion. Sinuses/Orbits: The visualized paranasal sinuses are essentially clear. The mastoid air cells are unopacified. Other: None. IMPRESSION: No evidence of acute intracranial abnormality. Old right occipital and right corona radiata infarcts. Extensive small vessel ischemic changes. Electronically Signed   By: Bertis Ruddy  [name] M.D.   On: 11/13/2021 22:11    Pending Labs Unresulted Labs (From admission, onward)     Start     Ordered   11/14/21 0200  Resp Panel by RT-PCR (Flu A&B, Covid) Anterior Nasal Swab  (Asymptomatic - Covid)  Once,   URGENT        11/14/21 0200   11/14/21 0200  Protime-INR  (Stroke Panel (PNL))  ONCE - STAT,   STAT        11/14/21 0200   11/14/21 0200  APTT  (Stroke Panel (PNL))  ONCE - STAT,   STAT        11/14/21 0200   11/14/21 0200  Urinalysis, Routine w reflex microscopic  Once,   URGENT        11/14/21 0200            Vitals/Pain Today's Vitals   11/13/21 2330 11/14/21 0115 11/14/21 0115 11/14/21 0204  BP: (!) 163/72 (!) 165/89    Pulse: 75 81    Resp: 17 19    Temp:   98.2 F (36.8 C)   TempSrc:   Oral   SpO2: 100% 97%    Weight:    87.8 kg  Height:    '5\' 2"'$  (1.575 m)  PainSc:        Isolation Precautions No active isolations  Medications Medications  acetaminophen (TYLENOL) tablet 650 mg (650 mg Oral Given 11/14/21 0112)    Mobility walks Low fall risk   Focused Assessments Neuro Assessment Handoff:  Swallow screen pass? Yes    NIH Stroke Scale ( + Modified Stroke Scale Criteria)  Interval: Initial Level of Consciousness (1a.)   : Alert, keenly responsive LOC  Questions (1b. )   +: Answers both questions correctly LOC Commands (1c. )   + : Performs both tasks correctly Best Gaze (2. )  +: Normal Visual (3. )  +: No visual loss Facial Palsy (4. )    : Normal symmetrical movements Motor Arm, Left (5a. )   +: No drift Motor Arm, Right (5b. )   +: No drift Motor Leg, Left (6a. )   +: No drift Motor Leg, Right (6b. )   +: No drift Limb Ataxia (7. ): Absent Sensory (8. )   +: Normal, no sensory loss Best Language (9. )   +: No aphasia Dysarthria (10. ): Normal Extinction/Inattention (11.)   +: No Abnormality Modified SS Total  +: 0 Complete NIHSS TOTAL: 0     Neuro Assessment: Exceptions to WDL Neuro Checks:   Initial (11/14/21 0220)  Last Documented NIHSS Modified Score: 0 (11/14/21 0220) Has TPA been given? No If patient is a Neuro Trauma and patient is going to OR before floor call report to [place] nurse: [phone] or [phone]   R Recommendations: See Admitting Provider Note  Report given to:   Additional Notes:

## 2021-11-14 NOTE — Progress Notes (Signed)
[facility] PROGRESS NOTE    Progress Note  Valerie Ramirez  DGU:[id number] DOB: 11-15-1932 DOA: 11/13/2021 PCP: Abner Greenspan, MD     Brief Narrative:   Valerie Ramirez is an 88 y.o. female past medical history significant for coronary artery disease, status post PCI with stenting on aspirin, prior history of CVA presents to [facility] ED for generalized weakness associated with left peripheral vision loss, she relates she was running into things while riding the lawnmower in the ED      Significant studies: CT of the head revealed an old right occipital and right corona radiata infarct and extensive small vessel ischemic changes. MRI revealed patchy small volume acute right PCA distribution CVA, acute to subacute ischemic infarct involving bilateral hemispheres and [place] related cerebral atrophy. MRA of the head pending  Antibiotics: None  Microbiology data: Blood culture:  Procedures: None  Assessment/Plan:   Acute CVA (cerebrovascular accident) ([place]) HgbA1c 6.2, fasting lipid panel pending MRI as above MRI pending PT, OT, Speech consult pending Transthoracic Echo, pending Start patient on ASA '81mg'$  daily and plavix '75mg'$  daily, she was previously on aspirin will continue aspirin and Plavix for 3 weeks then drop aspirin continue Plavix and outpatient. High-dose statins BP goal: permissive HTN upto 220/120 mmHg Telemetry monitoring  Acute kidney injury on chronic kidney disease stage IIIb: With a baseline creatinine 1.4. On admission 1.9, will start on IV fluid resuscitation creatinine is improving slowly. Continue IV fluids for an additional 24 hours.  Mild acute metabolic encephalopathy: Question acute confusional state reorient. Rule out infection.  Urinalysis pending. Possibly due to CVA.  Leukocytosis: Likely reactive UA is pending. Has remained afebrile influenza and SARS-CoV-2 PCR negative.  Essential hypertension Allow permissive  hypertension  DVT prophylaxis: lovenox Family Communication:none Status is: Inpatient Remains inpatient appropriate because: Acute CVA    Code Status:     Code Status Orders  (From admission, onward)           Start     Ordered   11/14/21 0541  Full code  Continuous        11/14/21 0540           Code Status History     Date Active Date Inactive Code Status Order ID Comments User Context   09/18/2017 1432 09/21/2017 1907 Full Code [id number]  Jani Gravel, MD ED   04/06/2017 2212 04/09/2017 2058 Full Code [id number]  Johna Sheriff, MD Inpatient   04/05/2017 0248 04/06/2017 2212 Full Code [id number]  Norval Morton, MD ED   04/26/2016 0158 04/29/2016 2227 Full Code [id number]  Toy Baker, MD Inpatient   07/04/2012 1248 07/11/2012 1651 Full Code [id number]  Lahoma Crocker, MD Inpatient      Advance Directive Documentation    Flowsheet Row Most Recent Value  Type of Advance Directive Living will, Out of facility DNR (pink MOST or yellow form)  [pt states yes to Living will and DNR but no documents are present]  Pre-existing out of facility DNR order (yellow form or pink MOST form) --  "MOST" Form in Place? --         IV Access:   Peripheral IV   Procedures and diagnostic studies:   MR BRAIN WO CONTRAST  Result Date: 11/14/2021 CLINICAL DATA:  Initial evaluation for acute dizziness. EXAM: MRI HEAD WITHOUT CONTRAST TECHNIQUE: Multiplanar, multiecho pulse sequences of the brain and surrounding structures were obtained without intravenous contrast. COMPARISON:  Prior head CT from 11/13/2021 as well as earlier  studies FINDINGS: Brain: Generalized age-related cerebral atrophy. Extensive patchy and confluent T2/FLAIR hyperintensity involving the periventricular and deep white matter both cerebral hemispheres as well as the pons, most consistent with chronic small vessel ischemic disease, advanced in nature. Encephalomalacia and gliosis involving the right occipital  lobe consistent with a chronic right PCA distribution infarct. Additional remote infarct noted at the right frontal corona radiata. Few probable small remote lacunar infarcts about the bilateral basal ganglia. Scattered patchy restricted diffusion involving the right splenium and adjacent right occipital lobe, consistent with acute right PCA distribution infarct (series 5, images 75, 72, 69). Probable faint patchy involvement of the right thalamus noted as well (series 5, image 76). This is superimposed on the underlying chronic right PCA territory infarct. Furthermore, there are multiple additional scattered acute to early subacute ischemic infarcts involving the bilateral cerebral hemispheres (series 5, images 85, 83, 82, 74, 71). No associated hemorrhage or mass effect about these infarcts. No acute intracranial hemorrhage. Chronic micro hemorrhages involving the right occipital lobe and left cerebellum noted, stable. Additional chronic micro hemorrhages involving the right thalamus are new as compared to prior MRI from 2021. No mass lesion, midline shift or mass effect. No hydrocephalus or extra-axial fluid collection. Empty sella noted. Vascular: Major intracranial vascular flow voids are maintained. Skull and upper cervical spine: Craniocervical junction within normal limits. Bone marrow signal intensity within normal limits. Prominent degenerative spondylosis noted within the visualized upper cervical spine without significant spinal stenosis. No scalp soft tissue abnormality. Sinuses/Orbits: Prior bilateral ocular lens replacement. Paranasal sinuses are largely clear. Trace bilateral mastoid effusions noted, of doubtful significance. Other: None. IMPRESSION: 1. Patchy small volume acute nonhemorrhagic right PCA distribution infarct as above. This is superimposed on an underlying chronic right PCA territory infarct. 2. Additional scattered subcentimeter acute to early subacute nonhemorrhagic ischemic infarcts  involving the bilateral cerebral hemispheres as above. 3. Underlying age-related cerebral atrophy with advanced chronic microvascular ischemic disease, with a few scattered remote ischemic infarcts as above. Electronically Signed   By: Jeannine Boga M.D.   On: 11/14/2021 01:00   CT Head Wo Contrast  Result Date: 11/13/2021 CLINICAL DATA:  Fall off lawnmower EXAM: CT HEAD WITHOUT CONTRAST TECHNIQUE: Contiguous axial images were obtained from the base of the skull through the vertex without intravenous contrast. RADIATION DOSE REDUCTION: This exam was performed according to the departmental dose-optimization program which includes automated exposure control, adjustment of the mA and/or kV according to patient size and/or use of iterative reconstruction technique. COMPARISON:  06/24/2021 FINDINGS: Brain: No evidence of acute infarction, hemorrhage, hydrocephalus, extra-axial collection or mass lesion/mass effect. Old right occipital infarct.  Old right corona radiata infarct. Extensive subcortical white matter and periventricular small vessel ischemic changes. Asymmetric hyperdensity along the left falx (series 3/image 20) is unchanged from the prior and favored to reflect a vascular calcification. Vascular: Intracranial atherosclerosis. Skull: Normal. Negative for fracture or focal lesion. Sinuses/Orbits: The visualized paranasal sinuses are essentially clear. The mastoid air cells are unopacified. Other: None. IMPRESSION: No evidence of acute intracranial abnormality. Old right occipital and right corona radiata infarcts. Extensive small vessel ischemic changes. Electronically Signed   By: Julian Hy M.D.   On: 11/13/2021 22:11     Medical Consultants:   None.   Subjective:    Valerie Ramirez sleepy but able to carry on a conversation this morning has no new complaints.  Objective:    Vitals:   11/14/21 0204 11/14/21 0230 11/14/21 0245 11/14/21 0338  BP:  [name])  170/70 136/73 (!)  168/63  Pulse:  70 74 70  Resp:  '11 11 18  '$ Temp:    98 F (36.7 C)  TempSrc:    Oral  SpO2:  99% 100% 99%  Weight: 87.8 kg     Height: '5\' 2"'$  (1.575 m)      SpO2: 99 %  No intake or output data in the 24 hours ending 11/14/21 0737 Filed Weights   11/14/21 0204  Weight: 87.8 kg    Exam: General exam: In no acute distress. Respiratory system: Good air movement and clear to auscultation. Cardiovascular system: S1 & S2 heard, RRR. No JVD. Gastrointestinal system: Abdomen is nondistended, soft and nontender.  Central nervous system: Alert and oriented.  Extremities: No pedal edema. Skin: No rashes, lesions or ulcers Psychiatry: Judgment and insight appear intact, she is sleepy this morning does not want to be bothered.   Data Reviewed:    Labs: Basic Metabolic Panel: Recent Labs  Lab 11/13/21 1956 11/14/21 0624  NA 138 139  K 4.2 3.9  CL 104 107  CO2 23 23  GLUCOSE 133* 139*  BUN 38* 33*  CREATININE 1.99* 1.79*  CALCIUM 9.1 9.1  MG  --  2.4  PHOS  --  4.5   GFR Estimated Creatinine Clearance: 22.8 mL/min (A) (by C-G formula based on SCr of 1.79 mg/dL (H)). Liver Function Tests: Recent Labs  Lab 11/14/21 0624  AST 27  ALT 17  ALKPHOS 98  BILITOT 0.9  PROT 6.7  ALBUMIN 3.6   No results for input(s): "LIPASE", "AMYLASE" in the last 168 hours. No results for input(s): "AMMONIA" in the last 168 hours. Coagulation profile Recent Labs  Lab 11/14/21 0242  INR 1.1   COVID-19 Labs  No results for input(s): "DDIMER", "FERRITIN", "LDH", "CRP" in the last 72 hours.  Lab Results  Component Value Date   SARSCOV2NAA NEGATIVE 11/14/2021   SARSCOV2NAA Detected (A) 11/01/2019    CBC: Recent Labs  Lab 11/13/21 1956 11/14/21 0624  WBC 11.2* 9.1  NEUTROABS 7.0 5.5  HGB 12.4 12.5  HCT 39.2 37.9  MCV 97.5 94.5  PLT 201 193   Cardiac Enzymes: No results for input(s): "CKTOTAL", "CKMB", "CKMBINDEX", "TROPONINI" in the last 168 hours. BNP (last 3  results) No results for input(s): "PROBNP" in the last 8760 hours. CBG: No results for input(s): "GLUCAP" in the last 168 hours. D-Dimer: No results for input(s): "DDIMER" in the last 72 hours. Hgb A1c: Recent Labs    11/14/21 0624  HGBA1C 6.2*   Lipid Profile: No results for input(s): "CHOL", "HDL", "LDLCALC", "TRIG", "CHOLHDL", "LDLDIRECT" in the last 72 hours. Thyroid function studies: No results for input(s): "TSH", "T4TOTAL", "T3FREE", "THYROIDAB" in the last 72 hours.  Invalid input(s): "FREET3" Anemia work up: No results for input(s): "VITAMINB12", "FOLATE", "FERRITIN", "TIBC", "IRON", "RETICCTPCT" in the last 72 hours. Sepsis Labs: Recent Labs  Lab 11/13/21 1956 11/14/21 0624  WBC 11.2* 9.1   Microbiology Recent Results (from the past 240 hour(s))  Resp Panel by RT-PCR (Flu A&B, Covid) Anterior Nasal Swab     Status: None   Collection Time: 11/14/21  2:00 AM   Specimen: Anterior Nasal Swab  Result Value Ref Range Status   SARS Coronavirus 2 by RT PCR NEGATIVE NEGATIVE Final    Comment: (NOTE) SARS-CoV-2 target nucleic acids are NOT DETECTED.  The SARS-CoV-2 RNA is generally detectable in upper respiratory specimens during the acute phase of infection. The lowest concentration of SARS-CoV-2 viral copies this  assay can detect is 138 copies/mL. A negative result does not preclude SARS-Cov-2 infection and should not be used as the sole basis for treatment or other patient management decisions. A negative result may occur with  improper specimen collection/handling, submission of specimen other than nasopharyngeal swab, presence of viral mutation(s) within the areas targeted by this assay, and inadequate number of viral copies(<138 copies/mL). A negative result must be combined with clinical observations, patient history, and epidemiological information. The expected result is Negative.  Fact Sheet for Patients:  EntrepreneurPulse.com.au  Fact  Sheet for Healthcare Providers:  IncredibleEmployment.be  This test is no t yet approved or cleared by the [place] FDA and  has been authorized for detection and/or diagnosis of SARS-CoV-2 by FDA under an Emergency Use Authorization (EUA). This EUA will remain  in effect (meaning this test can be used) for the duration of the COVID-19 declaration under Section 564(b)(1) of the Act, 21 U.S.C.section 360bbb-3(b)(1), unless the authorization is terminated  or revoked sooner.       Influenza A by PCR NEGATIVE NEGATIVE Final   Influenza B by PCR NEGATIVE NEGATIVE Final    Comment: (NOTE) The Xpert Xpress SARS-CoV-2/FLU/RSV plus assay is intended as an aid in the diagnosis of influenza from Nasopharyngeal swab specimens and should not be used as a sole basis for treatment. Nasal washings and aspirates are unacceptable for Xpert Xpress SARS-CoV-2/FLU/RSV testing.  Fact Sheet for Patients: EntrepreneurPulse.com.au  Fact Sheet for Healthcare Providers: IncredibleEmployment.be  This test is not yet approved or cleared by the [place] FDA and has been authorized for detection and/or diagnosis of SARS-CoV-2 by FDA under an Emergency Use Authorization (EUA). This EUA will remain in effect (meaning this test can be used) for the duration of the COVID-19 declaration under Section 564(b)(1) of the Act, 21 U.S.C. section 360bbb-3(b)(1), unless the authorization is terminated or revoked.  Performed at [facility], [place] [address] [place], [place] [address]      Medications:    aspirin  325 mg Oral Daily   enoxaparin (LOVENOX) injection  30 mg Subcutaneous Q24H   rosuvastatin  10 mg Oral Daily   Continuous Infusions:  sodium chloride 50 mL/hr at 11/14/21 0652      LOS: 0 days   Charlynne Cousins  [facility]  11/14/2021, 7:37 AM

## 2021-11-14 NOTE — H&P (Signed)
History and Physical  Valerie Ramirez BHA:[id number] DOB: Sep 01, 1932 DOA: 11/13/2021  Referring physician: Luberta Robertson  PCP: Abner Greenspan, MD  Outpatient Specialists: Neurology, cardiology. Patient coming from: Home.  Chief Complaint: Generalized weakness  HPI: Valerie Ramirez is a 88 y.o. female with medical history significant for coronary artery disease status post PCI with stenting on aspirin, hypertension, hypothyroidism, hyperlipidemia, prior CVA who presented to [facility] ED for evaluation of generalized weakness.  Associated with left peripheral vision changes.  Has had incidence of running into things while riding her lawnmower or driving her vehicle.  No recent head trauma.    In the ED, noncontrast CT head revealed old right occipital and right corona radiator infarcts and extensive small vessel ischemic changes but no evidence of acute intracranial injury.  However, MRI brain revealed patchy small volume acute nonhemorrhagic right PCA distribution infarct.  Additionally revealed acute to early subacute nonhemorrhagic ischemic infarct involving the bilateral cerebral hemispheres.  EDP consulted neurology/stroke team.  The patient was admitted by [facility], hospitalist service for stroke work-up.  ED Course: Tmax 98.6.  BP 116/63, pulse 70, respiration rate 18, O2 saturation 99% on room air.  Lab studies remarkable for serum glucose 133, BUN 38, creatinine 1.99 from baseline of 1.4, GFR 24.  WBC 11.2.  Review of Systems: Review of systems as noted in the HPI. All other systems reviewed and are negative.   Past Medical History:  Diagnosis Date   Arthritis    OA   CAD (coronary artery disease)    stent December 2018   Cancer [facility])    endometrial   GERD (gastroesophageal reflux disease)    Hearing loss    History of kidney stones    Hyperlipidemia    Hypertension    Hypothyroid    "not in years"   Shortness of breath dyspnea    04/25/17- not anymore   Past Surgical History:   Procedure Laterality Date   ABDOMINAL HYSTERECTOMY     ANTERIOR CERVICAL DECOMP/DISCECTOMY FUSION [name] 08/18/2015   Procedure: C5-6, C6-7 Anterior Cervical Discectomy and Fusion, Allograft, Plate;  Surgeon: Marybelle Killings, MD;  Location: [place];  Service: Orthopedics;  Laterality: [name];   bone spur removed Right    shoulder   BOWEL RESECTION  07/04/2012   Procedure: SMALL BOWEL RESECTION;  Surgeon: Alvino Chapel, MD;  Location: [facility];  Service: Gynecology;;   BREAST BIOPSY     CHOLECYSTECTOMY     CORONARY STENT INTERVENTION [name] 04/06/2017   Procedure: CORONARY STENT INTERVENTION;  Surgeon: Wellington Hampshire, MD;  Location: [place] CV LAB;  Service: Cardiovascular;  Laterality: [name];   DILATION AND CURETTAGE OF UTERUS  1999   EYE SURGERY Bilateral    cataracts   FALSE ANEURYSM REPAIR Right 04/26/2017   Procedure: 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;  Surgeon: Conrad [place], MD;  Location: [place];  Service: Vascular;  Laterality: Right;   HAND SURGERY  12/2008   after hand fx/due to fall   JOINT REPLACEMENT Bilateral 07/1998   knees   KNEE ARTHROPLASTY  05/23/1998   total right   LAPAROTOMY Bilateral 07/04/2012   Procedure: EXPLORATORY LAPAROTOMY TOTAL ABDOMINAL HYSTERECTOMY BILATERAL SALPINGO-OOPHORECTOMY with small bowel resection ;  Surgeon: Alvino Chapel, MD;  Location: [facility];  Service: Gynecology;  Laterality: Bilateral;   LEFT HEART CATH AND CORONARY ANGIOGRAPHY [name] 04/06/2017   Procedure: LEFT HEART CATH AND CORONARY ANGIOGRAPHY;  Surgeon: Jolaine Artist, MD;  Location: [facility]  INVASIVE CV LAB;  Service: Cardiovascular;  Laterality: [name];   polyp removal  1999   TUBAL LIGATION      Social History:  reports that she has never smoked. She has never used smokeless tobacco. She reports that she does not drink alcohol and does not use drugs.   Allergies  Allergen Reactions   Allopurinol Nausea Only   Lipitor [Atorvastatin Calcium] Itching and Other (See  Comments)    Leg aches and aching all over   Morphine And Related Other (See Comments)    Makes the patient "loopy,"    Simvastatin Other (See Comments)    MUSCLE PAIN   Tape Itching, Rash and Other (See Comments)    "Cannot use paper tape" --- also causes blisters. Use plastic tape    Family History  Problem Relation Age of Onset   Heart disease Father    Heart attack Father    Leukemia Brother    Lung cancer Brother    Leukemia Brother    Stomach cancer Brother    Heart disease Sister        CABG   Stroke Sister    Cancer Sister    Bladder Cancer Sister    Heart disease Brother        CABG   Stroke Sister        "massive brain bleed"      Prior to Admission medications   Medication Sig Start Date End Date Taking? Authorizing Provider  aspirin EC 81 MG tablet Take 1 tablet (81 mg total) by mouth daily. 04/06/18   Kilroy, Doreene Burke, PA-C  BAYER BACK & BODY 500-32.5 MG TABS Take 1-3 tablets by mouth every 6 (six) hours as needed (for pain).    [provider]  Cholecalciferol (VITAMIN D-3) 25 MCG (1000 UT) CAPS Take 1,000 Units by mouth daily.    [provider]  Colchicine 0.6 MG CAPS Take 1 capsule by mouth in the morning and at bedtime. Patient taking differently: Take 0.6 mg by mouth in the morning and at bedtime. 08/28/20   Tower, Wynelle Fanny, MD  colchicine 0.6 MG tablet TAKE ONE TABLET BY MOUTH TWICE A DAY IN THE MORNING AND EVENING 03/17/21   Tower, Wynelle Fanny, MD  Cyanocobalamin (VITAMIN B-12 PO) Take 1 tablet by mouth daily.    [provider]  DULoxetine (CYMBALTA) 30 MG capsule TAKE ONE CAPSULE BY MOUTH DAILY 02/24/21   Tower, Wynelle Fanny, MD  Iron, Ferrous Sulfate, 325 (65 Fe) MG TABS Take 325 mg by mouth daily. 02/16/21   Lesleigh Noe, MD  multivitamin-iron-minerals-folic acid (CENTRUM) chewable tablet Chew 1 tablet by mouth daily. 02/10/21   Carmin Muskrat, MD  nitroGLYCERIN (NITROSTAT) 0.4 MG SL tablet Place 1 tablet (0.4 mg total) under the  tongue every 5 (five) minutes x 3 doses as needed for chest pain. 04/09/17   [name], [name], PA-C    Physical Exam: BP (!) 168/63 (BP Location: Right Arm)   Pulse 70   Temp 98 F (36.7 C) (Oral)   Resp 18   Ht '5\' 2"'$  (1.575 m)   Wt 87.8 kg   SpO2 99%   BMI 35.40 kg/m   General: 88 y.o. year-old female well developed well nourished in no acute distress.  Alert and interactive. Cardiovascular: Regular rate and rhythm with no rubs or gallops.  No thyromegaly or JVD noted.  No lower extremity edema bilaterally. Respiratory: Clear to auscultation with no wheezes or rales. Good inspiratory effort.  Abdomen: Soft nontender nondistended with normal bowel sounds x4 quadrants. Muskuloskeletal: No cyanosis, clubbing or edema noted bilaterally Neuro: CN II-XII intact, strength, sensation, reflexes Skin: No ulcerative lesions noted or rashes Psychiatry: Judgement and insight appear normal. Mood is appropriate for condition and setting          Labs on Admission:  Basic Metabolic Panel: Recent Labs  Lab 11/13/21 1956  NA 138  K 4.2  CL 104  CO2 23  GLUCOSE 133*  BUN 38*  CREATININE 1.99*  CALCIUM 9.1   Liver Function Tests: No results for input(s): "AST", "ALT", "ALKPHOS", "BILITOT", "PROT", "ALBUMIN" in the last 168 hours. No results for input(s): "LIPASE", "AMYLASE" in the last 168 hours. No results for input(s): "AMMONIA" in the last 168 hours. CBC: Recent Labs  Lab 11/13/21 1956  WBC 11.2*  NEUTROABS 7.0  HGB 12.4  HCT 39.2  MCV 97.5  PLT 201   Cardiac Enzymes: No results for input(s): "CKTOTAL", "CKMB", "CKMBINDEX", "TROPONINI" in the last 168 hours.  BNP (last 3 results) No results for input(s): "BNP" in the last 8760 hours.  ProBNP (last 3 results) No results for input(s): "PROBNP" in the last 8760 hours.  CBG: No results for input(s): "GLUCAP" in the last 168 hours.  Radiological Exams on Admission: MR BRAIN WO CONTRAST  Result Date:  11/14/2021 CLINICAL DATA:  Initial evaluation for acute dizziness. EXAM: MRI HEAD WITHOUT CONTRAST TECHNIQUE: Multiplanar, multiecho pulse sequences of the brain and surrounding structures were obtained without intravenous contrast. COMPARISON:  Prior head CT from 11/13/2021 as well as earlier studies FINDINGS: Brain: Generalized age-related cerebral atrophy. Extensive patchy and confluent T2/FLAIR hyperintensity involving the periventricular and deep white matter both cerebral hemispheres as well as the pons, most consistent with chronic small vessel ischemic disease, advanced in nature. Encephalomalacia and gliosis involving the right occipital lobe consistent with a chronic right PCA distribution infarct. Additional remote infarct noted at the right frontal corona radiata. Few probable small remote lacunar infarcts about the bilateral basal ganglia. Scattered patchy restricted diffusion involving the right splenium and adjacent right occipital lobe, consistent with acute right PCA distribution infarct (series 5, images 75, 72, 69). Probable faint patchy involvement of the right thalamus noted as well (series 5, image 76). This is superimposed on the underlying chronic right PCA territory infarct. Furthermore, there are multiple additional scattered acute to early subacute ischemic infarcts involving the bilateral cerebral hemispheres (series 5, images 85, 83, 82, 74, 71). No associated hemorrhage or mass effect about these infarcts. No acute intracranial hemorrhage. Chronic micro hemorrhages involving the right occipital lobe and left cerebellum noted, stable. Additional chronic micro hemorrhages involving the right thalamus are new as compared to prior MRI from 2021. No mass lesion, midline shift or mass effect. No hydrocephalus or extra-axial fluid collection. Empty sella noted. Vascular: Major intracranial vascular flow voids are maintained. Skull and upper cervical spine: Craniocervical junction within normal  limits. Bone marrow signal intensity within normal limits. Prominent degenerative spondylosis noted within the visualized upper cervical spine without significant spinal stenosis. No scalp soft tissue abnormality. Sinuses/Orbits: Prior bilateral ocular lens replacement. Paranasal sinuses are largely clear. Trace bilateral mastoid effusions noted, of doubtful significance. Other: None. IMPRESSION: 1. Patchy small volume acute nonhemorrhagic right PCA distribution infarct as above. This is superimposed on an underlying chronic right PCA territory infarct. 2. Additional scattered subcentimeter acute to early subacute nonhemorrhagic ischemic infarcts involving the bilateral cerebral hemispheres as above. 3. Underlying age-related cerebral atrophy with advanced chronic microvascular  ischemic disease, with a few scattered remote ischemic infarcts as above. Electronically Signed   By: Jeannine Boga M.D.   On: 11/14/2021 01:00   CT Head Wo Contrast  Result Date: 11/13/2021 CLINICAL DATA:  Fall off lawnmower EXAM: CT HEAD WITHOUT CONTRAST TECHNIQUE: Contiguous axial images were obtained from the base of the skull through the vertex without intravenous contrast. RADIATION DOSE REDUCTION: This exam was performed according to the departmental dose-optimization program which includes automated exposure control, adjustment of the mA and/or kV according to patient size and/or use of iterative reconstruction technique. COMPARISON:  06/24/2021 FINDINGS: Brain: No evidence of acute infarction, hemorrhage, hydrocephalus, extra-axial collection or mass lesion/mass effect. Old right occipital infarct.  Old right corona radiata infarct. Extensive subcortical white matter and periventricular small vessel ischemic changes. Asymmetric hyperdensity along the left falx (series 3/image 20) is unchanged from the prior and favored to reflect a vascular calcification. Vascular: Intracranial atherosclerosis. Skull: Normal. Negative for  fracture or focal lesion. Sinuses/Orbits: The visualized paranasal sinuses are essentially clear. The mastoid air cells are unopacified. Other: None. IMPRESSION: No evidence of acute intracranial abnormality. Old right occipital and right corona radiata infarcts. Extensive small vessel ischemic changes. Electronically Signed   By: Julian Hy M.D.   On: 11/13/2021 22:11    EKG: I independently viewed the EKG done and my findings are as followed:    Assessment/Plan Present on Admission:  Acute CVA (cerebrovascular accident) [facility])  Principal Problem:   Acute CVA (cerebrovascular accident) ([place])  Generalized weakness/acute CVA seen on MRI brain History of prior CVA Admitted for stroke work-up Start full dose aspirin 325 mg daily Defer choice of antiplatelets to neurology/stroke team. Patient has intolerance to Lipitor and simvastatin, started on Crestor  Obtain fasting lipid panel, hemoglobin A1c Goal LDL less than 70, goal A1c less than 7.0. PT/OT/speech therapist evaluation Neurochecks every 4 hours Aspiration/fall precautions 2D echo with bubble study MRA head without contrast Carotid Doppler ultrasound Monitor on telemetry to rule out A-fib Permissive hypertension, treat SBP greater than 220 or DBP greater than 120  AKI on CKD 3B Baseline creatinine 1.4 with GFR of 33 Presented with creatinine of 1.99 with GFR 24 Avoid nephrotoxic agents, dehydration and hypotension Start NS at 50 cc/h x 1 day Monitor urine output with strict I's and O's.  Leukocytosis, possibly reactive Rule out other causes, obtain UA and urine culture Monitor fever curve and WBC  Mild acute metabolic encephalopathy Waxes and wanes, suspect delirium Reorient as needed Follow UA to rule out UTI   Critical care time: 65 minutes.   DVT prophylaxis: Subcu Lovenox daily  Code Status: Full code  Family Communication: Daughter at bedside  Disposition Plan: Admitted to telemetry medical  unit  Consults called: Neurology/stroke team  Admission status: Inpatient status.   Status is: Inpatient The patient requires at least 2 midnights for further evaluation and treatment of present condition.   Kayleen Memos MD [facility] Pager [phone]  If 7PM-7AM, please contact night-coverage [email or web address] Password [facility]  11/14/2021, 5:39 AM

## 2021-11-14 NOTE — Progress Notes (Signed)
VASCULAR LAB    Carotid duplex has been performed.  See CV proc for preliminary results.   Keyler Hoge, RVT 11/14/2021, 12:08 PM

## 2021-11-14 NOTE — Evaluation (Signed)
Physical Therapy Evaluation Patient Details Name: Valerie Ramirez MRN: [id number] DOB: [birth year] Today's Date: 11/14/2021  History of Present Illness  Pt is an 88 y/o F presenting to ED for weakness and L visual field impairments. CT revealing old R occipital and R corona radiata infarcts. MRI revealing patchy small volume acute nonhemorrhagic R PCA distrubution infarcts. PMH includes CAD s/p DES Lcx 2018, HTN, hypothyroid, HLD, prior CVA   Clinical Impression  Pt admitted with above diagnosis. PTA pt lived at home with daughter, I/mod I mobility without AD. Pt currently with functional limitations due to the deficits listed below (see PT Problem List). On eval, she required supervision bed mobility, min guard assist transfers, and min guard assist ambulation 150' without AD. She demonstrates L visual field deficits affecting her ability to attend to L side. Pt will benefit from skilled PT to increase their independence and safety with mobility to allow discharge to the venue listed below.          Recommendations for follow up therapy are one component of a multi-disciplinary discharge planning process, led by the attending physician.  Recommendations may be updated based on patient status, additional functional criteria and insurance authorization.  Follow Up Recommendations Home health PT      Assistance Recommended at Discharge PRN  Patient can return home with the following       Equipment Recommendations None recommended by PT  Recommendations for Other Services       Functional Status Assessment Patient has had a recent decline in their functional status and demonstrates the ability to make significant improvements in function in a reasonable and predictable amount of time.     Precautions / Restrictions Precautions Precautions: Fall Precaution Comments: Pt reports falls at home. Restrictions Weight Bearing Restrictions: No      Mobility  Bed Mobility Overal bed  mobility: Needs Assistance Bed Mobility: Supine to Sit, Sit to Supine     Supine to sit: Supervision, HOB elevated Sit to supine: Supervision, HOB elevated   General bed mobility comments: increased time    Transfers Overall transfer level: Needs assistance Equipment used: None Transfers: Sit to/from Stand Sit to Stand: Min guard                Ambulation/Gait Ambulation/Gait assistance: Min guard Gait Distance (Feet): 150 Feet Assistive device: None Gait Pattern/deviations: Step-through pattern, Decreased stride length, Wide base of support Gait velocity: decreased Gait velocity interpretation: 1.31 - 2.62 ft/sec, indicative of limited community ambulator   General Gait Details: difficulty with obstacles on L  Stairs            Wheelchair Mobility    Modified Rankin (Stroke Patients Only) Modified Rankin (Stroke Patients Only) Pre-Morbid Rankin Score: No symptoms Modified Rankin: Moderate disability     Balance Overall balance assessment: Needs assistance Sitting-balance support: Feet supported, No upper extremity supported Sitting balance-Leahy Scale: Good     Standing balance support: During functional activity, No upper extremity supported Standing balance-Leahy Scale: Good                               Pertinent Vitals/Pain Pain Assessment Pain Assessment: No/denies pain    Home Living Family/patient expects to be discharged to:: Private residence Living Arrangements: Alone (son reports daughter lives there part time) Available Help at Discharge: Family Type of Home: House Home Access: Stairs to enter Entrance Stairs-Rails: Right Entrance Stairs-Number of Steps: 4  Home Layout: One level Home Equipment: Conservation officer, nature (2 wheels);Cane - quad;Wheelchair - manual;Shower seat - built in;Shower seat      Prior Function Prior Level of Function : Independent/Modified Independent;Driving             Mobility Comments: no AD  use ADLs Comments: ind with ADLs;     Hand Dominance   Dominant Hand: Right    Extremity/Trunk Assessment   Upper Extremity Assessment Upper Extremity Assessment: Overall WFL for tasks assessed    Lower Extremity Assessment Lower Extremity Assessment: Overall WFL for tasks assessed    Cervical / Trunk Assessment Cervical / Trunk Assessment: Normal  Communication   Communication: No difficulties  Cognition Arousal/Alertness: Awake/alert Behavior During Therapy: WFL for tasks assessed/performed, Restless, Impulsive Overall Cognitive Status: Within Functional Limits for tasks assessed                                 General Comments: pt tangential in conversation, impulsive, however follows commands appropriately, A & O x4        General Comments General comments (skin integrity, edema, etc.): VSS on RA    Exercises     Assessment/Plan    PT Assessment Patient needs continued PT services  PT Problem List Decreased mobility;Decreased activity tolerance;Decreased balance       PT Treatment Interventions Therapeutic activities;Gait training;Therapeutic exercise;Patient/family education;Balance training;Stair training;Functional mobility training    PT Goals (Current goals can be found in the Care Plan section)  Acute Rehab PT Goals Patient Stated Goal: home PT Goal Formulation: With patient Time For Goal Achievement: 11/28/21 Potential to Achieve Goals: Good    Frequency Min 4X/week     Co-evaluation               AM-PAC PT "6 Clicks" Mobility  Outcome Measure Help needed turning from your back to your side while in a flat bed without using bedrails?: A Little Help needed moving from lying on your back to sitting on the side of a flat bed without using bedrails?: A Little Help needed moving to and from a bed to a chair (including a wheelchair)?: A Little Help needed standing up from a chair using your arms (e.g., wheelchair or bedside  chair)?: A Little Help needed to walk in hospital room?: A Little Help needed climbing 3-5 steps with a railing? : A Little 6 Click Score: 18    End of Session Equipment Utilized During Treatment: Gait belt Activity Tolerance: Patient tolerated treatment well Patient left: in bed;with call bell/phone within reach;with bed alarm set Nurse Communication: Mobility status PT Visit Diagnosis: Unsteadiness on feet (R26.81)    Time: 0940-7680 PT Time Calculation (min) (ACUTE ONLY): 16 min   Charges:   PT Evaluation $PT Eval Moderate Complexity: 1 Mod          Lorrin Goodell, PT  Office # [phone] Pager [phone]   Lorriane Shire 11/14/2021, 10:27 AM

## 2021-11-14 NOTE — Consult Note (Addendum)
NEURO HOSPITALIST CONSULT NOTE   Requesting physician: Dr. Randal Buba  Reason for Consult: Multifocal small acute ischemic infarctions on MRI  History obtained from:   Patient and Chart     HPI:                                                                                                                                          Valerie Ramirez is an 88 y.o. female with a PMHx of arthritis, CAD s/p stenting, endometrial cancer, kidney stones, HTN, HLD and hypothyroidism who presented to the ED on Friday after she was thrown off her lawnmower the day before. She had no obvious injuries but EMS noted her to be saying "strange things" despite being alert and oriented x 4.   MRI was obtained revealing patchy small volume acute nonhemorrhagic right PCA distribution Infarct which was superimposed on an underlying significantly larger chronic right PCA territory infarct. Additional scattered subcentimeter acute to early subacute nonhemorrhagic ischemic infarcts involving the bilateral cerebral hemispheres were also noted. There was underlying age-related cerebral atrophy with advanced chronic microvascular ischemic disease, with a few scattered remote ischemic infarcts.   Past Medical History:  Diagnosis Date   Arthritis    OA   CAD (coronary artery disease)    stent December 2018   Cancer [facility])    endometrial   GERD (gastroesophageal reflux disease)    Hearing loss    History of kidney stones    Hyperlipidemia    Hypertension    Hypothyroid    "not in years"   Shortness of breath dyspnea    04/25/17- not anymore    Past Surgical History:  Procedure Laterality Date   ABDOMINAL HYSTERECTOMY     ANTERIOR CERVICAL DECOMP/DISCECTOMY FUSION [name] 08/18/2015   Procedure: C5-6, C6-7 Anterior Cervical Discectomy and Fusion, Allograft, Plate;  Surgeon: Marybelle Killings, MD;  Location: [place];  Service: Orthopedics;  Laterality: [name];   bone spur removed Right    shoulder   BOWEL  RESECTION  07/04/2012   Procedure: SMALL BOWEL RESECTION;  Surgeon: Alvino Chapel, MD;  Location: [facility];  Service: Gynecology;;   BREAST BIOPSY     CHOLECYSTECTOMY     CORONARY STENT INTERVENTION [name] 04/06/2017   Procedure: CORONARY STENT INTERVENTION;  Surgeon: Wellington Hampshire, MD;  Location: [place] [facility];  Service: Cardiovascular;  Laterality: [name];   DILATION AND CURETTAGE OF UTERUS  1999   EYE SURGERY Bilateral    cataracts   FALSE ANEURYSM REPAIR Right 04/26/2017   Procedure: 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;  Surgeon: Conrad [place], MD;  Location: [place];  Service: Vascular;  Laterality: Right;   HAND SURGERY  12/2008   after hand fx/due to fall   JOINT REPLACEMENT  Bilateral 07/1998   knees   KNEE ARTHROPLASTY  05/23/1998   total right   LAPAROTOMY Bilateral 07/04/2012   Procedure: EXPLORATORY LAPAROTOMY TOTAL ABDOMINAL HYSTERECTOMY BILATERAL SALPINGO-OOPHORECTOMY with small bowel resection ;  Surgeon: Alvino Chapel, MD;  Location: [facility];  Service: Gynecology;  Laterality: Bilateral;   LEFT HEART CATH AND CORONARY ANGIOGRAPHY [name] 04/06/2017   Procedure: LEFT HEART CATH AND CORONARY ANGIOGRAPHY;  Surgeon: Jolaine Artist, MD;  Location: [place] [facility];  Service: Cardiovascular;  Laterality: [name];   polyp removal  1999   TUBAL LIGATION      Family History  Problem Relation Age of Onset   Heart disease Father    Heart attack Father    Leukemia Brother    Lung cancer Brother    Leukemia Brother    Stomach cancer Brother    Heart disease Sister        CABG   Stroke Sister    Cancer Sister    Bladder Cancer Sister    Heart disease Brother        CABG   Stroke Sister        "massive brain bleed"              Social History:  reports that she has never smoked. She has never used smokeless tobacco. She reports that she does not drink alcohol and does not use drugs.  Allergies  Allergen Reactions   Allopurinol Nausea Only    Lipitor [Atorvastatin Calcium] Itching and Other (See Comments)    Leg aches and aching all over   Morphine And Related Other (See Comments)    Makes the patient "loopy,"    Simvastatin Other (See Comments)    MUSCLE PAIN   Tape Itching, Rash and Other (See Comments)    "Cannot use paper tape" --- also causes blisters. Use plastic tape    MEDICATIONS:                                                                                                                     Prior to Admission:  Medications Prior to Admission  Medication Sig Dispense Refill Last Dose   aspirin EC 81 MG tablet Take 1 tablet (81 mg total) by mouth daily. 30 tablet 3    BAYER BACK & BODY 500-32.5 MG TABS Take 1-3 tablets by mouth every 6 (six) hours as needed (for pain).      Cholecalciferol (VITAMIN D-3) 25 MCG (1000 UT) CAPS Take 1,000 Units by mouth daily.      Colchicine 0.6 MG CAPS Take 1 capsule by mouth in the morning and at bedtime. (Patient taking differently: Take 0.6 mg by mouth in the morning and at bedtime.) 60 capsule 1    colchicine 0.6 MG tablet TAKE ONE TABLET BY MOUTH TWICE A DAY IN THE MORNING AND EVENING 60 tablet 3    Cyanocobalamin (VITAMIN B-12 PO) Take 1 tablet by mouth daily.      DULoxetine (CYMBALTA) 30 MG  capsule TAKE ONE CAPSULE BY MOUTH DAILY 90 capsule 3    Iron, Ferrous Sulfate, 325 (65 Fe) MG TABS Take 325 mg by mouth daily. 90 tablet 1    multivitamin-iron-minerals-folic acid (CENTRUM) chewable tablet Chew 1 tablet by mouth daily. 90 tablet 0    nitroGLYCERIN (NITROSTAT) 0.4 MG SL tablet Place 1 tablet (0.4 mg total) under the tongue every 5 (five) minutes x 3 doses as needed for chest pain. 25 tablet 12    Scheduled:  aspirin EC  81 mg Oral Daily   clopidogrel  75 mg Oral Daily   DULoxetine  30 mg Oral Daily   enoxaparin (LOVENOX) injection  30 mg Subcutaneous Q24H   polyethylene glycol  17 g Oral BID   [START ON 11/15/2021] rosuvastatin  20 mg Oral Daily   Continuous:  sodium  chloride 75 mL/hr at 11/14/21 0829     ROS:                                                                                                                                       The patient endorses vision loss involving her left eye but is not able to further specify. She is a poor historian and does not endorse any additional symptoms.    Blood pressure (!) 165/89, pulse 81, temperature 98.2 F (36.8 C), temperature source Oral, resp. rate 19, height '5\' 2"'$  (1.575 m), weight 87.8 kg, SpO2 97 %.   General Examination:                                                                                                       Physical Exam  HEENT-  [place]/AT    Lungs- Respirations unlabored Extremities- Warm and well perfused   Neurological Examination Mental Status: Awake and alert. Poorly oriented. Speech is sparse but fluent. Able to name a thumb and pinky finger, but not an index finger. Able to follow all basic commands.  Cranial Nerves: II: Left visual field cut. PERRL.   III,IV, VI: No ptosis. EOMI.  V: Temp sensation equal bilaterally  VII: Smile symmetric VIII: HOH IX,X: No hoarseness XI: Symmetric shoulder shrug XII: Midline tongue extension Motor: BUE 5/5 proximally and distally BLE 5/5 proximally and distally  No pronator drift.  Sensory: Temp and light touch intact throughout, bilaterally. Positive for extinction to LUE with DSS.  Deep Tendon Reflexes: 2+ and symmetric throughout Cerebellar: No ataxia with FNF bilaterally but has difficulty with targeting on the left in the  context of visual field cut.   Gait: Deferred    Lab Results: Basic Metabolic Panel: Recent Labs  Lab 11/13/21 1956  NA 138  K 4.2  CL 104  CO2 23  GLUCOSE 133*  BUN 38*  CREATININE 1.99*  CALCIUM 9.1    CBC: Recent Labs  Lab 11/13/21 1956  WBC 11.2*  NEUTROABS 7.0  HGB 12.4  HCT 39.2  MCV 97.5  PLT 201    Cardiac Enzymes: No results for input(s): "CKTOTAL", "CKMB",  "CKMBINDEX", "TROPONINI" in the last 168 hours.  Lipid Panel: No results for input(s): "CHOL", "TRIG", "HDL", "CHOLHDL", "VLDL", "LDLCALC" in the last 168 hours.  Imaging: MR BRAIN WO CONTRAST  Result Date: 11/14/2021 CLINICAL DATA:  Initial evaluation for acute dizziness. EXAM: MRI HEAD WITHOUT CONTRAST TECHNIQUE: Multiplanar, multiecho pulse sequences of the brain and surrounding structures were obtained without intravenous contrast. COMPARISON:  Prior head CT from 11/13/2021 as well as earlier studies FINDINGS: Brain: Generalized age-related cerebral atrophy. Extensive patchy and confluent T2/FLAIR hyperintensity involving the periventricular and deep white matter both cerebral hemispheres as well as the pons, most consistent with chronic small vessel ischemic disease, advanced in nature. Encephalomalacia and gliosis involving the right occipital lobe consistent with a chronic right PCA distribution infarct. Additional remote infarct noted at the right frontal corona radiata. Few probable small remote lacunar infarcts about the bilateral basal ganglia. Scattered patchy restricted diffusion involving the right splenium and adjacent right occipital lobe, consistent with acute right PCA distribution infarct (series 5, images 75, 72, 69). Probable faint patchy involvement of the right thalamus noted as well (series 5, image 76). This is superimposed on the underlying chronic right PCA territory infarct. Furthermore, there are multiple additional scattered acute to early subacute ischemic infarcts involving the bilateral cerebral hemispheres (series 5, images 85, 83, 82, 74, 71). No associated hemorrhage or mass effect about these infarcts. No acute intracranial hemorrhage. Chronic micro hemorrhages involving the right occipital lobe and left cerebellum noted, stable. Additional chronic micro hemorrhages involving the right thalamus are new as compared to prior MRI from 2021. No mass lesion, midline shift or  mass effect. No hydrocephalus or extra-axial fluid collection. Empty sella noted. Vascular: Major intracranial vascular flow voids are maintained. Skull and upper cervical spine: Craniocervical junction within normal limits. Bone marrow signal intensity within normal limits. Prominent degenerative spondylosis noted within the visualized upper cervical spine without significant spinal stenosis. No scalp soft tissue abnormality. Sinuses/Orbits: Prior bilateral ocular lens replacement. Paranasal sinuses are largely clear. Trace bilateral mastoid effusions noted, of doubtful significance. Other: None. IMPRESSION: 1. Patchy small volume acute nonhemorrhagic right PCA distribution infarct as above. This is superimposed on an underlying chronic right PCA territory infarct. 2. Additional scattered subcentimeter acute to early subacute nonhemorrhagic ischemic infarcts involving the bilateral cerebral hemispheres as above. 3. Underlying age-related cerebral atrophy with advanced chronic microvascular ischemic disease, with a few scattered remote ischemic infarcts as above. Electronically Signed   By: Jeannine Boga M.D.   On: 11/14/2021 01:00   CT Head Wo Contrast  Result Date: 11/13/2021 CLINICAL DATA:  Fall off lawnmower EXAM: CT HEAD WITHOUT CONTRAST TECHNIQUE: Contiguous axial images were obtained from the base of the skull through the vertex without intravenous contrast. RADIATION DOSE REDUCTION: This exam was performed according to the departmental dose-optimization program which includes automated exposure control, adjustment of the mA and/or kV according to patient size and/or use of iterative reconstruction technique. COMPARISON:  06/24/2021 FINDINGS: Brain: No evidence of acute infarction,  hemorrhage, hydrocephalus, extra-axial collection or mass lesion/mass effect. Old right occipital infarct.  Old right corona radiata infarct. Extensive subcortical white matter and periventricular small vessel ischemic  changes. Asymmetric hyperdensity along the left falx (series 3/image 20) is unchanged from the prior and favored to reflect a vascular calcification. Vascular: Intracranial atherosclerosis. Skull: Normal. Negative for fracture or focal lesion. Sinuses/Orbits: The visualized paranasal sinuses are essentially clear. The mastoid air cells are unopacified. Other: None. IMPRESSION: No evidence of acute intracranial abnormality. Old right occipital and right corona radiata infarcts. Extensive small vessel ischemic changes. Electronically Signed   By: Julian Hy M.D.   On: 11/13/2021 22:11     Assessment: 88 y.o. female with a PMHx of arthritis, CAD s/p stenting, endometrial cancer, kidney stones, HTN, HLD and hypothyroidism who presented to the ED on Friday after she was thrown off her lawnmower the day before. She had no obvious injuries but EMS noted her to be saying "strange things" despite being alert and oriented x 4.   - Exam reveals evidence for cognitive impairment and left visual field cut.  - MRI brain: Patchy small volume acute nonhemorrhagic right PCA distribution infarct. This is superimposed on an underlying significantly larger chronic right PCA territory infarct. Additional scattered subcentimeter acute to early subacute nonhemorrhagic ischemic infarcts involving the bilateral cerebral hemispheres. Underlying age-related cerebral atrophy with advanced chronic microvascular ischemic disease, with a few scattered remote ischemic infarcts. - EKG: Sinus rhythm. Multiple premature complexes, vent & supraven - Multiple stroke risk factors as documented above.  - Classifiable as having failed ASA monotherapy - Suspect cardioembolic strokes due to involvement of multiple vascular territories     Recommendations: 1. HgbA1c, fasting lipid panel 2. MRA of the brain without contrast 3. PT consult, OT consult, Speech consult 4. TTE 5. Carotid ultrasound 6. Agree with adding Plavix to ASA 7.  Risk factor modification 8. Telemetry monitoring 9. Frequent neuro checks 10. NPO until passes stroke swallow screen 11. BP management.   Electronically signed: Dr. Kerney Elbe 11/14/2021, 2:14 AM

## 2021-11-14 NOTE — ED Provider Notes (Signed)
2:20 AM Care assumed at shift change from MD [place]. Patient presenting for evaluation of frequent falls, gait difficulty. Pending MRI which has been reviewed and shows findings of acute CVA.   Plan for admission to the hospitalist service. Dr. Nevada Crane to see in the ED. Dr. Cheral Marker of Neurology has also been made aware of this patient; neurology team to consult this AM.  Patient and daughter updated at bedside and agreeable to plan.  Results for orders placed or performed during the hospital encounter of 11/13/21  CBC with Differential  Result Value Ref Range   WBC 11.2 (H) 4.0 - 10.5 K/uL   RBC 4.02 3.87 - 5.11 MIL/uL   Hemoglobin 12.4 12.0 - 15.0 g/dL   HCT 39.2 36.0 - 46.0 %   MCV 97.5 80.0 - 100.0 fL   MCH 30.8 26.0 - 34.0 pg   MCHC 31.6 30.0 - 36.0 g/dL   RDW 13.7 11.5 - 15.5 %   Platelets 201 150 - 400 K/uL   nRBC 0.0 0.0 - 0.2 %   Neutrophils Relative % 62 %   Neutro Abs 7.0 1.7 - 7.7 K/uL   Lymphocytes Relative 24 %   Lymphs Abs 2.7 0.7 - 4.0 K/uL   Monocytes Relative 11 %   Monocytes Absolute 1.3 (H) 0.1 - 1.0 K/uL   Eosinophils Relative 2 %   Eosinophils Absolute 0.2 0.0 - 0.5 K/uL   Basophils Relative 1 %   Basophils Absolute 0.1 0.0 - 0.1 K/uL   Immature Granulocytes 0 %   Abs Immature Granulocytes 0.04 0.00 - 0.07 K/uL  Basic metabolic panel  Result Value Ref Range   Sodium 138 135 - 145 mmol/L   Potassium 4.2 3.5 - 5.1 mmol/L   Chloride 104 98 - 111 mmol/L   CO2 23 22 - 32 mmol/L   Glucose, Bld 133 (H) 70 - 99 mg/dL   BUN 38 (H) 8 - 23 mg/dL   Creatinine, Ser 1.99 (H) 0.44 - 1.00 mg/dL   Calcium 9.1 8.9 - 10.3 mg/dL   GFR, Estimated 24 (L) >60 mL/min   Anion gap 11 5 - 15   MR BRAIN WO CONTRAST  Result Date: 11/14/2021 CLINICAL DATA:  Initial evaluation for acute dizziness. EXAM: MRI HEAD WITHOUT CONTRAST TECHNIQUE: Multiplanar, multiecho pulse sequences of the brain and surrounding structures were obtained without intravenous contrast. COMPARISON:  Prior  head CT from 11/13/2021 as well as earlier studies FINDINGS: Brain: Generalized age-related cerebral atrophy. Extensive patchy and confluent T2/FLAIR hyperintensity involving the periventricular and deep white matter both cerebral hemispheres as well as the pons, most consistent with chronic small vessel ischemic disease, advanced in nature. Encephalomalacia and gliosis involving the right occipital lobe consistent with a chronic right PCA distribution infarct. Additional remote infarct noted at the right frontal corona radiata. Few probable small remote lacunar infarcts about the bilateral basal ganglia. Scattered patchy restricted diffusion involving the right splenium and adjacent right occipital lobe, consistent with acute right PCA distribution infarct (series 5, images 75, 72, 69). Probable faint patchy involvement of the right thalamus noted as well (series 5, image 76). This is superimposed on the underlying chronic right PCA territory infarct. Furthermore, there are multiple additional scattered acute to early subacute ischemic infarcts involving the bilateral cerebral hemispheres (series 5, images 85, 83, 82, 74, 71). No associated hemorrhage or mass effect about these infarcts. No acute intracranial hemorrhage. Chronic micro hemorrhages involving the right occipital lobe and left cerebellum noted, stable. Additional chronic micro hemorrhages  involving the right thalamus are new as compared to prior MRI from 2021. No mass lesion, midline shift or mass effect. No hydrocephalus or extra-axial fluid collection. Empty sella noted. Vascular: Major intracranial vascular flow voids are maintained. Skull and upper cervical spine: Craniocervical junction within normal limits. Bone marrow signal intensity within normal limits. Prominent degenerative spondylosis noted within the visualized upper cervical spine without significant spinal stenosis. No scalp soft tissue abnormality. Sinuses/Orbits: Prior bilateral  ocular lens replacement. Paranasal sinuses are largely clear. Trace bilateral mastoid effusions noted, of doubtful significance. Other: None. IMPRESSION: 1. Patchy small volume acute nonhemorrhagic right PCA distribution infarct as above. This is superimposed on an underlying chronic right PCA territory infarct. 2. Additional scattered subcentimeter acute to early subacute nonhemorrhagic ischemic infarcts involving the bilateral cerebral hemispheres as above. 3. Underlying age-related cerebral atrophy with advanced chronic microvascular ischemic disease, with a few scattered remote ischemic infarcts as above. Electronically Signed   By: Jeannine Boga M.D.   On: 11/14/2021 01:00   CT Head Wo Contrast  Result Date: 11/13/2021 CLINICAL DATA:  Fall off lawnmower EXAM: CT HEAD WITHOUT CONTRAST TECHNIQUE: Contiguous axial images were obtained from the base of the skull through the vertex without intravenous contrast. RADIATION DOSE REDUCTION: This exam was performed according to the departmental dose-optimization program which includes automated exposure control, adjustment of the mA and/or kV according to patient size and/or use of iterative reconstruction technique. COMPARISON:  06/24/2021 FINDINGS: Brain: No evidence of acute infarction, hemorrhage, hydrocephalus, extra-axial collection or mass lesion/mass effect. Old right occipital infarct.  Old right corona radiata infarct. Extensive subcortical white matter and periventricular small vessel ischemic changes. Asymmetric hyperdensity along the left falx (series 3/image 20) is unchanged from the prior and favored to reflect a vascular calcification. Vascular: Intracranial atherosclerosis. Skull: Normal. Negative for fracture or focal lesion. Sinuses/Orbits: The visualized paranasal sinuses are essentially clear. The mastoid air cells are unopacified. Other: None. IMPRESSION: No evidence of acute intracranial abnormality. Old right occipital and right corona  radiata infarcts. Extensive small vessel ischemic changes. Electronically Signed   By: Julian Hy M.D.   On: 11/13/2021 22:11       Antonietta Breach, Hershal Coria 11/14/21 [place], April, MD 11/14/21 [id number]

## 2021-11-15 ENCOUNTER — Other Ambulatory Visit: Payer: Self-pay | Admitting: Family Medicine

## 2021-11-15 DIAGNOSIS — N179 Acute kidney failure, unspecified: Secondary | ICD-10-CM | POA: Diagnosis not present

## 2021-11-15 DIAGNOSIS — I639 Cerebral infarction, unspecified: Secondary | ICD-10-CM | POA: Diagnosis not present

## 2021-11-15 DIAGNOSIS — D509 Iron deficiency anemia, unspecified: Secondary | ICD-10-CM

## 2021-11-15 DIAGNOSIS — N1832 Chronic kidney disease, stage 3b: Secondary | ICD-10-CM | POA: Diagnosis not present

## 2021-11-15 DIAGNOSIS — I10 Essential (primary) hypertension: Secondary | ICD-10-CM | POA: Diagnosis not present

## 2021-11-15 DIAGNOSIS — D72829 Elevated white blood cell count, unspecified: Secondary | ICD-10-CM

## 2021-11-15 MED ORDER — ROSUVASTATIN CALCIUM 20 MG PO TABS: 20.0000 mg | ORAL_TABLET | Freq: Every day | ORAL | 0 refills | Status: DC

## 2021-11-15 MED ORDER — ASPIRIN EC 81 MG PO TBEC: 81.0000 mg | DELAYED_RELEASE_TABLET | Freq: Every day | ORAL | 0 refills | Status: AC

## 2021-11-15 MED ORDER — CLOPIDOGREL BISULFATE 75 MG PO TABS: 75.0000 mg | ORAL_TABLET | Freq: Every day | ORAL | 3 refills | Status: DC

## 2021-11-15 NOTE — TOC Transition Note (Signed)
Transition of Care [facility]) - CM/SW Discharge Note   Patient Details  Name: Valerie Ramirez MRN: [id number] Date of Birth: 07-Jan-1933  [facility] CM/SW Contact:  Carles Collet, RN Phone Number: 11/15/2021, 9:44 AM   Clinical Narrative:   Damaris Schooner w patient and daughter at bedside. They are agreeable to [facility] services through any provider. Referral accepted by [facility].  No other TOC needs identified    Final next level of care: [place] Barriers to Discharge: No Barriers Identified   Patient Goals and CMS Choice Patient states their goals for this hospitalization and ongoing recovery are:: to go home CMS Medicare.gov Compare Post Acute Care list provided to:: Other (Comment Required) Choice offered to / list presented to : Adult Children  Discharge Placement                       Discharge Plan and Services                          HH Arranged: PT, OT, Nurse's Aide [place] Agency: [place] ([facility]) Date [place]: 11/15/21 Time [place]: [phone] Representative spoke with at [place]: [place] Determinants of Health ([place]) Interventions     Readmission Risk Interventions     No data to display

## 2021-11-15 NOTE — Progress Notes (Signed)
   11/15/21 0834  Assess: MEWS Score  Temp 98.6 F (37 C)  BP (!) 204/79  MAP (mmHg) 109  Pulse Rate 84  ECG Heart Rate 88  Resp 17  Level of Consciousness Alert  SpO2 95 %  O2 Device Room Air  Assess: MEWS Score  MEWS Temp 0  MEWS Systolic 2  MEWS Pulse 0  MEWS RR 0  MEWS LOC 0  MEWS Score 2  MEWS Score Color Yellow  Assess: if the MEWS score is Yellow or Red  Were vital signs taken at a resting state? Yes  Focused Assessment No change from prior assessment  Does the patient meet 2 or more of the SIRS criteria? No  MEWS guidelines implemented *See Row Information* No, other (Comment) (Permissive HTN allowed. Current order parameter is to allow for 220/110)  Treat  Pain Scale 0-10  Pain Score 0  Assess: SIRS CRITERIA  SIRS Temperature  0  SIRS Pulse 0  SIRS Respirations  0  SIRS WBC 1  SIRS Score Sum  1

## 2021-11-15 NOTE — Discharge Summary (Signed)
Physician Discharge Summary  Valerie Ramirez ZOX:[id number] DOB: Sep 25, 1932 DOA: 11/13/2021  PCP: Abner Greenspan, MD  Admit date: 11/13/2021 Discharge date: 11/15/2021  Admitted From: [facility] Disposition:  home  Recommendations for Outpatient Follow-up:  Follow up with Neurologist in 1-2 weeks Please obtain BMP/CBC in one week We will go home with physical therapy  Home Health: Yes Equipment/Devices:None  Discharge Condition:Stable CODE STATUS:Full Diet recommendation: Heart Healthy  Brief/Interim Summary: 88 y.o. female past medical history significant for coronary artery disease, status post PCI with stenting on aspirin, prior history of CVA presents to [facility] ED for generalized weakness associated with left peripheral vision loss, she relates she was running into things while riding the lawnmower in the ED         Significant studies: CT of the head revealed an old right occipital and right corona radiata infarct and extensive small vessel ischemic changes. MRI revealed patchy small volume acute right PCA distribution CVA, acute to subacute ischemic infarct involving bilateral hemispheres and age related cerebral atrophy. MRA of the head showed patchy small volume right PCA distribution stroke superimposed on chronic right PCA territory infarct subcentimeter acute nonhemorrhagic infarct involving bilateral hemispheres  Discharge Diagnoses:  Principal Problem:   Acute CVA (cerebrovascular accident) ([facility]) Active Problems:   Hypothyroidism   Essential hypertension   AKI (acute kidney injury) ([facility])   Leukocytosis   Chronic renal disease, stage III (HCC)  Acute CVA: A1c of 6.9 fasting lipid panel showed HDL 52 LDL of 155 she was started on high-dose statins. MRI of the brain showed acute CVA. Physical therapy evaluated the patient and occupational recommended home health PT. 2D echo showed no shunt preserved EF grade 1 diastolic heart failure. Neurology was consulted  recommended aspirin and Plavix for 3 weeks then drop aspirin and continue Plavix. They also recommended high-dose statins. She had no events on telemetry.  Acute kidney injury on chronic kidney stage IIIb: With a baseline creatinine 1.4 and admission 1.9 she was started on IV fluids and her creatinine improved with IV fluid hydration check a basic metabolic panel in 1 week.  Leukocytosis: Likely reactive, UA showed no signs of infection, influenza and SARS-CoV-2 PCR were negative.  Elevated blood pressure without a diagnosis of hypertension: On no antihypertensive medications at home.  Discharge Instructions  Discharge Instructions     Diet - low sodium heart healthy   Complete by: As directed    Increase activity slowly   Complete by: As directed       Allergies as of 11/15/2021       Reactions   Allopurinol Nausea Only   Lipitor [atorvastatin Calcium] Itching, Other (See Comments)   Leg aches and aching all over   Morphine And Related Other (See Comments)   Makes the patient "loopy,"    Simvastatin Other (See Comments)   MUSCLE PAIN   Tape Itching, Rash, Other (See Comments)   "Cannot use paper tape" --- also causes blisters. Use plastic tape        Medication List     TAKE these medications    aspirin EC 81 MG tablet Take 1 tablet (81 mg total) by mouth daily for 20 days.   Bayer Back & Body 500-32.5 MG Tabs Generic drug: Aspirin-Caffeine Take 1 tablet by mouth every 6 (six) hours as needed (for pain).   clopidogrel 75 MG tablet Commonly known as: PLAVIX Take 1 tablet (75 mg total) by mouth daily.   Colchicine 0.6 MG Caps Take 1  capsule by mouth in the morning and at bedtime. What changed: how much to take   colchicine 0.6 MG tablet TAKE ONE TABLET BY MOUTH TWICE A DAY IN THE MORNING AND EVENING What changed: Another medication with the same name was changed. Make sure you understand how and when to take each.   DULoxetine 30 MG capsule Commonly  known as: CYMBALTA TAKE ONE CAPSULE BY MOUTH DAILY   Iron (Ferrous Sulfate) 325 (65 Fe) MG Tabs Take 325 mg by mouth daily.   multivitamin-iron-minerals-folic acid chewable tablet Chew 1 tablet by mouth daily.   nitroGLYCERIN 0.4 MG SL tablet Commonly known as: NITROSTAT Place 1 tablet (0.4 mg total) under the tongue every 5 (five) minutes x 3 doses as needed for chest pain. What changed: when to take this   rosuvastatin 20 MG tablet Commonly known as: CRESTOR Take 1 tablet (20 mg total) by mouth daily.   VITAMIN B-12 PO Take 1 tablet by mouth daily.   Vitamin D-3 25 MCG (1000 UT) Caps Take 1,000 Units by mouth daily.        Allergies  Allergen Reactions   Allopurinol Nausea Only   Lipitor [Atorvastatin Calcium] Itching and Other (See Comments)    Leg aches and aching all over   Morphine And Related Other (See Comments)    Makes the patient "loopy,"    Simvastatin Other (See Comments)    MUSCLE PAIN   Tape Itching, Rash and Other (See Comments)    "Cannot use paper tape" --- also causes blisters. Use plastic tape    Consultations: Neurology   Procedures/Studies: ECHOCARDIOGRAM COMPLETE BUBBLE STUDY  Result Date: 11/14/2021    ECHOCARDIOGRAM REPORT   Patient Name:   Valerie Ramirez Date of Exam: 11/14/2021 Medical Rec #:  [id number]       Height:       62.0 in Accession #:    [id number]      Weight:       193.6 lb Date of Birth:  06-Sep-1932       BSA:          1.885 m Patient Age:    6 years        BP:           163/67 mmHg Patient Gender: F               HR:           74 bpm. Exam Location:  Inpatient Procedure: 2D Echo, Cardiac Doppler, Color Doppler and Saline Contrast Bubble            Study Indications:    Stroke  History:        Patient has prior history of Echocardiogram examinations, most                 recent 04/05/2017.  Sonographer:    Merrie Roof RDCS Referring Phys: [id number] [place]  1. Left ventricular ejection fraction, by estimation, is  60 to 65%. The left ventricle has normal function. The left ventricle has no regional wall motion abnormalities. Left ventricular diastolic parameters are consistent with Grade I diastolic dysfunction (impaired relaxation).  2. Right ventricular systolic function is normal. The right ventricular size is normal.  3. Left atrial size was mildly dilated.  4. The mitral valve is normal in structure. Trivial mitral valve regurgitation. No evidence of mitral stenosis.  5. The aortic valve is tricuspid. There is mild calcification of the aortic valve. Aortic  valve regurgitation is not visualized. Aortic valve sclerosis is present, with no evidence of aortic valve stenosis.  6. The inferior vena cava is normal in size with greater than 50% respiratory variability, suggesting right atrial pressure of 3 mmHg.  7. Agitated saline contrast bubble study was negative, with no evidence of any interatrial shunt. Comparison(s): Prior images unable to be directly viewed, comparison made by report only. FINDINGS  Left Ventricle: Left ventricular ejection fraction, by estimation, is 60 to 65%. The left ventricle has normal function. The left ventricle has no regional wall motion abnormalities. The left ventricular internal cavity size was normal in size. There is  borderline concentric left ventricular hypertrophy. Left ventricular diastolic parameters are consistent with Grade I diastolic dysfunction (impaired relaxation). Right Ventricle: The right ventricular size is normal. No increase in right ventricular wall thickness. Right ventricular systolic function is normal. Left Atrium: Left atrial size was mildly dilated. Right Atrium: Right atrial size was normal in size. Pericardium: There is no evidence of pericardial effusion. Mitral Valve: The mitral valve is normal in structure. Trivial mitral valve regurgitation. No evidence of mitral valve stenosis. Tricuspid Valve: The tricuspid valve is normal in structure. Tricuspid valve  regurgitation is not demonstrated. No evidence of tricuspid stenosis. Aortic Valve: The aortic valve is tricuspid. There is mild calcification of the aortic valve. Aortic valve regurgitation is not visualized. Aortic valve sclerosis is present, with no evidence of aortic valve stenosis. Aortic valve mean gradient measures 3.0 mmHg. Aortic valve peak gradient measures 6.2 mmHg. Aortic valve area, by VTI measures 2.51 cm. Pulmonic Valve: The pulmonic valve was normal in structure. Pulmonic valve regurgitation is not visualized. No evidence of pulmonic stenosis. Aorta: The aortic root is normal in size and structure. Venous: The inferior vena cava is normal in size with greater than 50% respiratory variability, suggesting right atrial pressure of 3 mmHg. IAS/Shunts: No atrial level shunt detected by color flow Doppler. Agitated saline contrast was given intravenously to evaluate for intracardiac shunting. Agitated saline contrast bubble study was negative, with no evidence of any interatrial shunt.  LEFT VENTRICLE PLAX 2D LVIDd:         3.40 cm   Diastology LVIDs:         2.40 cm   LV e' medial:    5.00 cm/s LV PW:         1.40 cm   LV E/e' medial:  10.2 LV IVS:        1.20 cm   LV e' lateral:   6.20 cm/s LVOT diam:     2.00 cm   LV E/e' lateral: 8.2 LV SV:         53 LV SV Index:   28 LVOT Area:     3.14 cm  RIGHT VENTRICLE RV Basal diam:  2.90 cm RV S prime:     13.50 cm/s TAPSE (M-mode): 2.3 cm LEFT ATRIUM             Index        RIGHT ATRIUM           Index LA diam:        4.10 cm 2.17 cm/m   RA Area:     11.40 cm LA Vol (A2C):   52.4 ml 27.79 ml/m  RA Volume:   26.00 ml  13.79 ml/m LA Vol (A4C):   55.6 ml 29.49 ml/m LA Biplane Vol: 54.5 ml 28.91 ml/m  AORTIC VALVE AV Area (Vmax):    2.59  cm AV Area (Vmean):   2.53 cm AV Area (VTI):     2.51 cm AV Vmax:           125.00 cm/s AV Vmean:          82.700 cm/s AV VTI:            0.213 m AV Peak Grad:      6.2 mmHg AV Mean Grad:      3.0 mmHg LVOT Vmax:          103.00 cm/s LVOT Vmean:        66.600 cm/s LVOT VTI:          0.170 m LVOT/AV VTI ratio: 0.80  AORTA Ao Root diam: 2.90 cm MITRAL VALVE MV Area (PHT): 4.21 cm    SHUNTS MV Decel Time: 180 msec    Systemic VTI:  0.17 m MV E velocity: 50.80 cm/s  Systemic Diam: 2.00 cm MV A velocity: 83.30 cm/s MV E/A ratio:  0.61 Mihai Croitoru MD Electronically signed by Sanda Klein MD Signature Date/Time: 11/14/2021/1:11:57 PM    Final    VAS US CAROTID  Result Date: 11/14/2021 Carotid Arterial Duplex Study Patient Name:  Valerie Ramirez  Date of Exam:   11/14/2021 Medical Rec #: [id number]        Accession #:    [id number] Date of Birth: January 06, 1933        Patient Gender: F Patient Age:   51 years Exam Location:  [facility] Procedure:      VAS US CAROTID Referring Phys: Irene Pap --------------------------------------------------------------------------------  Indications:      CVA, Visual disturbance and Weakness. Risk Factors:     Hypertension, hyperlipidemia, coronary artery disease, prior                   CVA. Comparison Study: No prior study on file Performing Technologist: Sharion Dove RVS  Examination Guidelines: A complete evaluation includes B-mode imaging, spectral Doppler, color Doppler, and power Doppler as needed of all accessible portions of each vessel. Bilateral testing is considered an integral part of a complete examination. Limited examinations for reoccurring indications may be performed as noted.  Right Carotid Findings: +----------+--------+--------+--------+------------------+------------------+           PSV cm/sEDV cm/sStenosisPlaque DescriptionComments           +----------+--------+--------+--------+------------------+------------------+ CCA Prox  86      11                                intimal thickening +----------+--------+--------+--------+------------------+------------------+ CCA Distal71      16              heterogenous                          +----------+--------+--------+--------+------------------+------------------+ ICA Prox  67      13              calcific                             +----------+--------+--------+--------+------------------+------------------+ ICA Mid   72      14                                                   +----------+--------+--------+--------+------------------+------------------+  ICA Distal95      26                                                   +----------+--------+--------+--------+------------------+------------------+ ECA       92      17                                                   +----------+--------+--------+--------+------------------+------------------+ +----------+--------+-------+--------+-------------------+           PSV cm/sEDV cmsDescribeArm Pressure (mmHG) +----------+--------+-------+--------+-------------------+ [id number]                                         +----------+--------+-------+--------+-------------------+ +---------+--------+--+--------+--+ VertebralPSV cm/s49EDV cm/s14 +---------+--------+--+--------+--+  Left Carotid Findings: +----------+--------+--------+--------+------------------+------------------+           PSV cm/sEDV cm/sStenosisPlaque DescriptionComments           +----------+--------+--------+--------+------------------+------------------+ CCA Prox  99      15                                intimal thickening +----------+--------+--------+--------+------------------+------------------+ CCA Distal84      14                                intimal thickening +----------+--------+--------+--------+------------------+------------------+ ICA Prox  77      13              calcific                             +----------+--------+--------+--------+------------------+------------------+ ICA Mid   79      26                                                    +----------+--------+--------+--------+------------------+------------------+ ICA Distal99      23                                                   +----------+--------+--------+--------+------------------+------------------+ ECA       101     3                                                    +----------+--------+--------+--------+------------------+------------------+ +----------+--------+--------+--------+-------------------+           PSV cm/sEDV cm/sDescribeArm Pressure (mmHG) +----------+--------+--------+--------+-------------------+ [id number]                                         +----------+--------+--------+--------+-------------------+ +---------+--------+--+--------+-+  VertebralPSV cm/s42EDV cm/s6 +---------+--------+--+--------+-+   Summary: Right Carotid: The extracranial vessels were near-normal with only minimal wall                thickening or plaque. Left Carotid: The extracranial vessels were near-normal with only minimal wall               thickening or plaque. Vertebrals:  Bilateral vertebral arteries demonstrate antegrade flow. Subclavians: Normal flow hemodynamics were seen in bilateral subclavian              arteries. *See table(s) above for measurements and observations.     Preliminary    MR ANGIO HEAD WO CONTRAST  Result Date: 11/14/2021 CLINICAL DATA:  88 year old female with dizziness and acute on chronic right PCA territory infarcts on brain MRI. EXAM: MRA HEAD WITHOUT CONTRAST TECHNIQUE: Angiographic images of the Circle of Willis were acquired using MRA technique without intravenous contrast. COMPARISON:  Brain MRI 0001 hours today. FINDINGS: Anterior circulation: Antegrade flow in both ICA siphons. Mild siphon tortuosity and irregularity without significant stenosis. Normal left posterior communicating artery origin. Normal ophthalmic artery origins. Patent carotid termini. Dominant right ACA A1 segment. Diminutive and irregular left A1.  Anterior communicating artery is within normal limits. Multifocal moderate and occasionally severe bilateral ACA A2 irregularity and stenosis. Left MCA M1 segment is tortuous. Left MCA bifurcation is patent without stenosis. No left MCA branch occlusion or high-grade stenosis. Right MCA M1 segment is patent but with moderate stenosis at its midpoint (series 1037, image 8). Right MCA bifurcation is patent. No right MCA branch occlusion identified. Only mild right MCA branch irregularity. Posterior circulation: Antegrade flow in the posterior circulation. Dominant right vertebral V4 segment, the left functionally terminates in PICA. Right PICA origin is patent. Mild irregularity and stenosis at the vertebrobasilar junction. Mild additional basilar artery irregularity and stenosis. Patent basilar tip, SCA and right PCA origins. Fetal type left PCA origin. Right posterior communicating artery is diminutive or absent. Right P1 and P2 segment remain patent with only mild irregularity and stenosis. There is asymmetric attenuated flow signal in the distal right PCA branches. Fetal type left PCA with up to moderate P2 segment irregularity and severe P3 branch stenosis (series 1049, image 4). Anatomic variants: Dominant right vertebral artery. Fetal type left PCA origin. Other: No intracranial mass effect or ventriculomegaly. IMPRESSION: 1. Negative for large vessel occlusion. Poor flow in the distal right PCA branches. 2. Intracranial atherosclerosis with other significant stenoses: - Moderate stenosis of the mid Right MCA M1. - Moderate to severe bilateral ACA A2 stenoses. - Severe Left PCA P3 branch stenosis. Electronically Signed   By: Genevie Ann M.D.   On: 11/14/2021 11:27   MR BRAIN WO CONTRAST  Result Date: 11/14/2021 CLINICAL DATA:  Initial evaluation for acute dizziness. EXAM: MRI HEAD WITHOUT CONTRAST TECHNIQUE: Multiplanar, multiecho pulse sequences of the brain and surrounding structures were obtained without  intravenous contrast. COMPARISON:  Prior head CT from 11/13/2021 as well as earlier studies FINDINGS: Brain: Generalized age-related cerebral atrophy. Extensive patchy and confluent T2/FLAIR hyperintensity involving the periventricular and deep white matter both cerebral hemispheres as well as the pons, most consistent with chronic small vessel ischemic disease, advanced in nature. Encephalomalacia and gliosis involving the right occipital lobe consistent with a chronic right PCA distribution infarct. Additional remote infarct noted at the right frontal corona radiata. Few probable small remote lacunar infarcts about the bilateral basal ganglia. Scattered patchy restricted diffusion involving the right splenium and adjacent right  occipital lobe, consistent with acute right PCA distribution infarct (series 5, images 75, 72, 69). Probable faint patchy involvement of the right thalamus noted as well (series 5, image 76). This is superimposed on the underlying chronic right PCA territory infarct. Furthermore, there are multiple additional scattered acute to early subacute ischemic infarcts involving the bilateral cerebral hemispheres (series 5, images 85, 83, 82, 74, 71). No associated hemorrhage or mass effect about these infarcts. No acute intracranial hemorrhage. Chronic micro hemorrhages involving the right occipital lobe and left cerebellum noted, stable. Additional chronic micro hemorrhages involving the right thalamus are new as compared to prior MRI from 2021. No mass lesion, midline shift or mass effect. No hydrocephalus or extra-axial fluid collection. Empty sella noted. Vascular: Major intracranial vascular flow voids are maintained. Skull and upper cervical spine: Craniocervical junction within normal limits. Bone marrow signal intensity within normal limits. Prominent degenerative spondylosis noted within the visualized upper cervical spine without significant spinal stenosis. No scalp soft tissue  abnormality. Sinuses/Orbits: Prior bilateral ocular lens replacement. Paranasal sinuses are largely clear. Trace bilateral mastoid effusions noted, of doubtful significance. Other: None. IMPRESSION: 1. Patchy small volume acute nonhemorrhagic right PCA distribution infarct as above. This is superimposed on an underlying chronic right PCA territory infarct. 2. Additional scattered subcentimeter acute to early subacute nonhemorrhagic ischemic infarcts involving the bilateral cerebral hemispheres as above. 3. Underlying age-related cerebral atrophy with advanced chronic microvascular ischemic disease, with a few scattered remote ischemic infarcts as above. Electronically Signed   By: Jeannine Boga M.D.   On: 11/14/2021 01:00   CT Head Wo Contrast  Result Date: 11/13/2021 CLINICAL DATA:  Fall off lawnmower EXAM: CT HEAD WITHOUT CONTRAST TECHNIQUE: Contiguous axial images were obtained from the base of the skull through the vertex without intravenous contrast. RADIATION DOSE REDUCTION: This exam was performed according to the departmental dose-optimization program which includes automated exposure control, adjustment of the mA and/or kV according to patient size and/or use of iterative reconstruction technique. COMPARISON:  06/24/2021 FINDINGS: Brain: No evidence of acute infarction, hemorrhage, hydrocephalus, extra-axial collection or mass lesion/mass effect. Old right occipital infarct.  Old right corona radiata infarct. Extensive subcortical white matter and periventricular small vessel ischemic changes. Asymmetric hyperdensity along the left falx (series 3/image 20) is unchanged from the prior and favored to reflect a vascular calcification. Vascular: Intracranial atherosclerosis. Skull: Normal. Negative for fracture or focal lesion. Sinuses/Orbits: The visualized paranasal sinuses are essentially clear. The mastoid air cells are unopacified. Other: None. IMPRESSION: No evidence of acute intracranial  abnormality. Old right occipital and right corona radiata infarcts. Extensive small vessel ischemic changes. Electronically Signed   By: Julian Hy M.D.   On: 11/13/2021 22:11   (Echo, Carotid, EGD, Colonoscopy, ERCP)    Subjective: No complaints  Discharge Exam: Vitals:   11/14/21 2309 11/15/21 0400  BP: (!) 171/66 (!) 192/78  Pulse: 90 70  Resp: 20 16  Temp: 98.7 F (37.1 C) 97.9 F (36.6 C)  SpO2: 95% 95%   Vitals:   11/14/21 2054 11/14/21 2309 11/15/21 0400 11/15/21 0600  BP: (!) 188/71 (!) 171/66 (!) 192/78   Pulse: 87 90 70   Resp:  20 16   Temp:  98.7 F (37.1 C) 97.9 F (36.6 C)   TempSrc:  Oral Oral   SpO2: 95% 95% 95%   Weight:    85.2 kg  Height:        General: Pt is alert, awake, not in acute distress Cardiovascular: RRR, S1/S2 +,  no rubs, no gallops Respiratory: CTA bilaterally, no wheezing, no rhonchi Abdominal: Soft, NT, ND, bowel sounds + Extremities: no edema, no cyanosis    The results of significant diagnostics from this hospitalization (including imaging, microbiology, ancillary and laboratory) are listed below for reference.     Microbiology: Recent Results (from the past 240 hour(s))  Resp Panel by RT-PCR (Flu A&B, Covid) Anterior Nasal Swab     Status: None   Collection Time: 11/14/21  2:00 AM   Specimen: Anterior Nasal Swab  Result Value Ref Range Status   SARS Coronavirus 2 by RT PCR NEGATIVE NEGATIVE Final    Comment: (NOTE) SARS-CoV-2 target nucleic acids are NOT DETECTED.  The SARS-CoV-2 RNA is generally detectable in upper respiratory specimens during the acute phase of infection. The lowest concentration of SARS-CoV-2 viral copies this assay can detect is 138 copies/mL. A negative result does not preclude SARS-Cov-2 infection and should not be used as the sole basis for treatment or other patient management decisions. A negative result may occur with  improper specimen collection/handling, submission of specimen  other than nasopharyngeal swab, presence of viral mutation(s) within the areas targeted by this assay, and inadequate number of viral copies(<138 copies/mL). A negative result must be combined with clinical observations, patient history, and epidemiological information. The expected result is Negative.  Fact Sheet for Patients:  EntrepreneurPulse.com.au  Fact Sheet for Healthcare Providers:  IncredibleEmployment.be  This test is no t yet approved or cleared by the [place] FDA and  has been authorized for detection and/or diagnosis of SARS-CoV-2 by FDA under an Emergency Use Authorization (EUA). This EUA will remain  in effect (meaning this test can be used) for the duration of the COVID-19 declaration under Section 564(b)(1) of the Act, 21 U.S.C.section 360bbb-3(b)(1), unless the authorization is terminated  or revoked sooner.       Influenza A by PCR NEGATIVE NEGATIVE Final   Influenza B by PCR NEGATIVE NEGATIVE Final    Comment: (NOTE) The Xpert Xpress SARS-CoV-2/FLU/RSV plus assay is intended as an aid in the diagnosis of influenza from Nasopharyngeal swab specimens and should not be used as a sole basis for treatment. Nasal washings and aspirates are unacceptable for Xpert Xpress SARS-CoV-2/FLU/RSV testing.  Fact Sheet for Patients: EntrepreneurPulse.com.au  Fact Sheet for Healthcare Providers: IncredibleEmployment.be  This test is not yet approved or cleared by the [place] FDA and has been authorized for detection and/or diagnosis of SARS-CoV-2 by FDA under an Emergency Use Authorization (EUA). This EUA will remain in effect (meaning this test can be used) for the duration of the COVID-19 declaration under Section 564(b)(1) of the Act, 21 U.S.C. section 360bbb-3(b)(1), unless the authorization is terminated or revoked.  Performed at [facility], [place] [address], [place],  [place] [address]      Labs: BNP (last 3 results) No results for input(s): "BNP" in the last 8760 hours. Basic Metabolic Panel: Recent Labs  Lab 11/13/21 1956 11/14/21 0624  NA 138 139  K 4.2 3.9  CL 104 107  CO2 23 23  GLUCOSE 133* 139*  BUN 38* 33*  CREATININE 1.99* 1.79*  CALCIUM 9.1 9.1  MG  --  2.4  PHOS  --  4.5   Liver Function Tests: Recent Labs  Lab 11/14/21 0624  AST 27  ALT 17  ALKPHOS 98  BILITOT 0.9  PROT 6.7  ALBUMIN 3.6   No results for input(s): "LIPASE", "AMYLASE" in the last 168 hours. No results for input(s): "  AMMONIA" in the last 168 hours. CBC: Recent Labs  Lab 11/13/21 1956 11/14/21 0624  WBC 11.2* 9.1  NEUTROABS 7.0 5.5  HGB 12.4 12.5  HCT 39.2 37.9  MCV 97.5 94.5  PLT 201 193   Cardiac Enzymes: No results for input(s): "CKTOTAL", "CKMB", "CKMBINDEX", "TROPONINI" in the last 168 hours. BNP: Invalid input(s): "POCBNP" CBG: No results for input(s): "GLUCAP" in the last 168 hours. D-Dimer No results for input(s): "DDIMER" in the last 72 hours. Hgb A1c Recent Labs    11/14/21 0624  HGBA1C 6.2*   Lipid Profile Recent Labs    11/14/21 0624  CHOL 239*  HDL 52  LDLCALC 155*  TRIG 161*  CHOLHDL 4.6   Thyroid function studies No results for input(s): "TSH", "T4TOTAL", "T3FREE", "THYROIDAB" in the last 72 hours.  Invalid input(s): "FREET3" Anemia work up No results for input(s): "VITAMINB12", "FOLATE", "FERRITIN", "TIBC", "IRON", "RETICCTPCT" in the last 72 hours. Urinalysis    Component Value Date/Time   COLORURINE YELLOW 11/14/2021 2021   APPEARANCEUR HAZY (A) 11/14/2021 2021   LABSPEC 1.013 11/14/2021 2021   PHURINE 5.0 11/14/2021 2021   GLUCOSEU NEGATIVE 11/14/2021 2021   HGBUR SMALL (A) 11/14/2021 2021   BILIRUBINUR NEGATIVE 11/14/2021 2021   BILIRUBINUR Negative 03/25/2020 1231   KETONESUR NEGATIVE 11/14/2021 2021   PROTEINUR 100 (A) 11/14/2021 2021   UROBILINOGEN 0.2 03/25/2020 1231   UROBILINOGEN 1.0 06/20/2014  1030   NITRITE NEGATIVE 11/14/2021 2021   LEUKOCYTESUR NEGATIVE 11/14/2021 2021   Sepsis Labs Recent Labs  Lab 11/13/21 1956 11/14/21 0624  WBC 11.2* 9.1   Microbiology Recent Results (from the past 240 hour(s))  Resp Panel by RT-PCR (Flu A&B, Covid) Anterior Nasal Swab     Status: None   Collection Time: 11/14/21  2:00 AM   Specimen: Anterior Nasal Swab  Result Value Ref Range Status   SARS Coronavirus 2 by RT PCR NEGATIVE NEGATIVE Final    Comment: (NOTE) SARS-CoV-2 target nucleic acids are NOT DETECTED.  The SARS-CoV-2 RNA is generally detectable in upper respiratory specimens during the acute phase of infection. The lowest concentration of SARS-CoV-2 viral copies this assay can detect is 138 copies/mL. A negative result does not preclude SARS-Cov-2 infection and should not be used as the sole basis for treatment or other patient management decisions. A negative result may occur with  improper specimen collection/handling, submission of specimen other than nasopharyngeal swab, presence of viral mutation(s) within the areas targeted by this assay, and inadequate number of viral copies(<138 copies/mL). A negative result must be combined with clinical observations, patient history, and epidemiological information. The expected result is Negative.  Fact Sheet for Patients:  EntrepreneurPulse.com.au  Fact Sheet for Healthcare Providers:  IncredibleEmployment.be  This test is no t yet approved or cleared by the [place] FDA and  has been authorized for detection and/or diagnosis of SARS-CoV-2 by FDA under an Emergency Use Authorization (EUA). This EUA will remain  in effect (meaning this test can be used) for the duration of the COVID-19 declaration under Section 564(b)(1) of the Act, 21 U.S.C.section 360bbb-3(b)(1), unless the authorization is terminated  or revoked sooner.       Influenza A by PCR NEGATIVE NEGATIVE Final    Influenza B by PCR NEGATIVE NEGATIVE Final    Comment: (NOTE) The Xpert Xpress SARS-CoV-2/FLU/RSV plus assay is intended as an aid in the diagnosis of influenza from Nasopharyngeal swab specimens and should not be used as a sole basis for treatment. Nasal washings and aspirates  are unacceptable for Xpert Xpress SARS-CoV-2/FLU/RSV testing.  Fact Sheet for Patients: EntrepreneurPulse.com.au  Fact Sheet for Healthcare Providers: IncredibleEmployment.be  This test is not yet approved or cleared by the [place] FDA and has been authorized for detection and/or diagnosis of SARS-CoV-2 by FDA under an Emergency Use Authorization (EUA). This EUA will remain in effect (meaning this test can be used) for the duration of the COVID-19 declaration under Section 564(b)(1) of the Act, 21 U.S.C. section 360bbb-3(b)(1), unless the authorization is terminated or revoked.  Performed at [facility], [place] [address], [place], [place] [address]      SIGNED:   Charlynne Cousins, MD  [facility] 11/15/2021, 8:44 AM Pager   If 7PM-7AM, please contact night-coverage [email or web address] Password [email or web address]

## 2021-11-16 ENCOUNTER — Other Ambulatory Visit: Payer: Self-pay | Admitting: *Deleted

## 2021-11-16 NOTE — Patient Outreach (Signed)
  Care Coordination [facility] Note Transition Care Management Follow-up Telephone Call Date of discharge and from where: 11/15/21 [facility] How have you been since you were released from the hospital? Daughter states the patient is doing very well Any questions or concerns? No  Items Reviewed: Did the pt receive and understand the discharge instructions provided? Yes  Medications obtained and verified? Yes  Other? No  Any new allergies since your discharge? No  Dietary orders reviewed? Yes Do you have support at home? Yes   Home Care and Equipment/Supplies: Were home health services ordered? yes If so, what is the name of the agency? [facility]  Has the agency set up a time to come to the patient's home? No, daughter states she has the number to call if she does not hear from them today Were any new equipment or medical supplies ordered?  No What is the name of the medical supply agency? [name] Were you able to get the supplies/equipment? not applicable Do you have any questions related to the use of the equipment or supplies? No  Functional Questionnaire: (I = Independent and D = Dependent) ADLs: D  Bathing/Dressing- D  Meal Prep- D  Eating- I  Maintaining continence- I  Transferring/Ambulation- D  Managing Meds- D  Follow up appointments reviewed:  PCP Hospital f/u appt confirmed? Yes  Scheduled to see Dr. Glori Bickers on 11/17/21 @ 1200. [place] [facility] f/u appt confirmed? No   Are transportation arrangements needed? No  If their condition worsens, is the pt aware to call PCP or go to the Emergency Dept.? Yes Was the patient provided with contact information for the PCP's office or ED? Yes Was to pt encouraged to call back with questions or concerns? Yes  SDOH assessments and interventions completed:   Yes  Care Coordination Interventions Activated:  No Care Coordination Interventions:   [name]  Encounter Outcome:  Pt. Visit Completed  Emelia Loron RN, BSN [place] [phone] [email or web address]

## 2021-11-17 ENCOUNTER — Ambulatory Visit: Payer: Medicare Other | Admitting: Family Medicine

## 2021-11-17 ENCOUNTER — Encounter: Payer: Self-pay | Admitting: Family Medicine

## 2021-11-17 VITALS — BP 141/85 | HR 76 | Ht 62.0 in | Wt 184.4 lb

## 2021-11-17 DIAGNOSIS — N3001 Acute cystitis with hematuria: Secondary | ICD-10-CM

## 2021-11-17 DIAGNOSIS — R7303 Prediabetes: Secondary | ICD-10-CM

## 2021-11-17 DIAGNOSIS — I10 Essential (primary) hypertension: Secondary | ICD-10-CM

## 2021-11-17 DIAGNOSIS — Z8673 Personal history of transient ischemic attack (TIA), and cerebral infarction without residual deficits: Secondary | ICD-10-CM | POA: Diagnosis not present

## 2021-11-17 DIAGNOSIS — W19XXXA Unspecified fall, initial encounter: Secondary | ICD-10-CM

## 2021-11-17 DIAGNOSIS — N1832 Chronic kidney disease, stage 3b: Secondary | ICD-10-CM | POA: Diagnosis not present

## 2021-11-17 DIAGNOSIS — N179 Acute kidney failure, unspecified: Secondary | ICD-10-CM | POA: Diagnosis not present

## 2021-11-17 LAB — URINE CULTURE: Culture: >=100000 — AB

## 2021-11-17 MED ORDER — ROSUVASTATIN CALCIUM 20 MG PO TABS: 20.0000 mg | ORAL_TABLET | Freq: Every day | ORAL | 3 refills | Status: DC

## 2021-11-17 MED ORDER — CLOPIDOGREL BISULFATE 75 MG PO TABS
75.0000 mg | ORAL_TABLET | Freq: Every day | ORAL | 3 refills | Status: DC
Start: 2021-11-17 — End: 2022-03-22

## 2021-11-17 MED ORDER — SULFAMETHOXAZOLE-TRIMETHOPRIM 800-160 MG PO TABS: 1.0000 | ORAL_TABLET | Freq: Two times a day (BID) | ORAL | 0 refills | Status: DC

## 2021-11-17 NOTE — Assessment & Plan Note (Signed)
Elevated wbc during hosp for cva Reviewed hospital records, lab results and studies in detail  Culture is now pos for enterobacter tx with bactrim (it is resistant to cefazolin)  Follow closely

## 2021-11-17 NOTE — Addendum Note (Signed)
Addended by: Loura Pardon A on: 11/17/2021 01:21 PM   Modules accepted: Orders

## 2021-11-17 NOTE — Patient Instructions (Addendum)
Stay off the lawnmower Do not drive a car  Don't stay out in the heat for too long   Tylenol is ok for headache if needed   You need more fluids for kidney health Drink 64 oz of fluids daily ( mostly water)  That will help uti also   Continue the aspirin and plavix for 2 weeks  Then just the plavix alone   Continue current medicines   Take the generic bactrim twice daily for 5 days  for uti     Labs for kidney function today

## 2021-11-17 NOTE — Assessment & Plan Note (Signed)
No meds currently in setting of recent cva and in permissible HTN range  BP: (!) 141/85    Enc low sodium diet  F/u planned with neuro  Pt will sched f/u with cardiology as well

## 2021-11-17 NOTE — Assessment & Plan Note (Signed)
Fall off lawnmower with recent stroke  Family will prevent her from high risk activities now such as mowing or driving  Balance is not optimal due to this and vision change and also current uti  Disc imp of hydration and heat avoidance  Disc fall prevention with pt and daughter

## 2021-11-17 NOTE — Assessment & Plan Note (Addendum)
AKI on ckd 3 noted in hospital in setting of cva  Baseline cr of 1.4 (went up to 1.9)  Reviewed hospital records, lab results and studies in detail   Could not get blood draw today-will return in week after abx and fluids  Now urine culture is pos for enterobacter as well Will tx with bactrim (resistant to cefazolin)  Enc fluid intake -this is difficult and family will help work on this also

## 2021-11-17 NOTE — Assessment & Plan Note (Signed)
Another cva recently when already on asa  R PCA territory Likely small vessel cause (nl carotid and echo)  Reviewed hospital records, lab results and studies in detail  She was started on high dose crestor/tolerating well so far  Also plavix with asa for 2 wk, then stop asa  Caution- has had subdural hem in the distant past  Planning f/u with neuro at [facility] (sees Dr Jaynee Eagles)  Some persistent vision change in L eye, eye visit is planned  Some chronic dizziness and headache  bp is permissibly mildly elevated  Poor compliance will be and issue for her  Family lives with her and helps also  Strongly enc not to mow/work in heat or drive Family to take keys away  inst to cal 911 if stroke symptoms return

## 2021-11-17 NOTE — Progress Notes (Signed)
Subjective:    Patient ID: Valerie Ramirez, female    DOB: 06-Nov-1932, 88 y.o.   MRN: [id number]  HPI Pt presents for hospital follow up   Wt Readings from Last 3 Encounters:  11/17/21 184 lb 6.4 oz (83.6 kg)  11/15/21 187 lb 13.3 oz (85.2 kg)  06/24/21 193 lb 8 oz (87.8 kg)   33.73 kg/m   She was hosp from 7/28 to 10/3021 for acute CVA  (mini strokes in the past)  She fell off lown mower  Headache/dizziness  Got pale and clammy    She presented with gen weakness and L peripheral vision loss   Studies as follows  CT of the head revealed an old right occipital and right corona radiata infarct and extensive small vessel ischemic changes. MRI revealed patchy small volume acute right PCA distribution CVA, acute to subacute ischemic infarct involving bilateral hemispheres and age related cerebral atrophy. MRA of the head showed patchy small volume right PCA distribution stroke superimposed on chronic right PCA territory infarct subcentimeter acute nonhemorrhagic infarct involving bilateral hemispheres  A/P from discharge summary Acute CVA: A1c of 6.9 fasting lipid panel showed HDL 52 LDL of 155 she was started on high-dose statins. MRI of the brain showed acute CVA. Physical therapy evaluated the patient and occupational recommended home health PT. 2D echo showed no shunt preserved EF grade 1 diastolic heart failure. Neurology was consulted recommended aspirin and Plavix for 3 weeks then drop aspirin and continue Plavix. They also recommended high-dose statins. She had no events on telemetry.  Acute kidney injury on chronic kidney stage IIIb: With a baseline creatinine 1.4 and admission 1.9 she was started on IV fluids and her creatinine improved with IV fluid hydration check a basic metabolic panel in 1 week.  Leukocytosis: Likely reactive, UA showed no signs of infection, influenza and SARS-CoV-2 PCR were negative.  Elevated blood pressure without a diagnosis of  hypertension: On no antihypertensive medications at home.   BP Readings from Last 3 Encounters:  11/17/21 (!) 148/82  11/15/21 (!) 204/79  06/24/21 (!) 154/98   Permissible high right now  Pulse Readings from Last 3 Encounters:  11/17/21 76  11/15/21 84  06/24/21 71    Unfortunately she did not take any of her medicine today  Here with daughter [name]   Urine culture returned today with enterobacter  Risistent to cefazolin   Lab Results  Component Value Date   CREATININE 1.79 (H) 11/14/2021   BUN 33 (H) 11/14/2021   NA 139 11/14/2021   K 3.9 11/14/2021   CL 107 11/14/2021   CO2 23 11/14/2021   Lab Results  Component Value Date   WBC 9.1 11/14/2021   HGB 12.5 11/14/2021   HCT 37.9 11/14/2021   MCV 94.5 11/14/2021   PLT 193 11/14/2021     Lab Results  Component Value Date   CHOL 239 (H) 11/14/2021   HDL 52 11/14/2021   LDLCALC 155 (H) 11/14/2021   LDLDIRECT 136.0 10/31/2015   TRIG 161 (H) 11/14/2021   CHOLHDL 4.6 11/14/2021   Needs a referral  Sees Dr Jaynee Eagles for neurology   Has eye doctor appt upcoming  Some improvement in vision so far (peripheral on L) and expects more improvement but unsure if she will get to baseline   Still some headache and dizzy spells   Has not hit head since coming home from the hospital  Family is living with her   No urinary symptoms   Lab  Results  Component Value Date   HGBA1C 6.2 (H) 11/14/2021   Patient Active Problem List   Diagnosis Date Noted   Acute CVA (cerebrovascular accident) ([place]) 11/14/2021   Fall 06/24/2021   Syncope and collapse 06/24/2021   Acute pain of both knees 06/24/2021   Light headedness 06/24/2021   Bilateral impacted cerumen 06/24/2021   Heme positive stool 02/24/2021   UTI (urinary tract infection) 03/25/2020   History of CVA (cerebrovascular accident) 02/14/2020   Headache 02/14/2020   Diarrhea 12/26/2017   Dizziness 12/26/2017   Depression 10/26/2017   H/O subdural hemorrhage  09/18/2017   CAD S/P percutaneous coronary angioplasty 09/18/2017   Closed fracture of nasal bones    Epistaxis    Elevated transaminase level 08/03/2017   Radial artery injury, left, sequela 06/15/2017   H/O: CVA (cerebrovascular accident) 05/26/2017   Sick sinus syndrome ([place]) 05/25/2017   Exertional chest pain 04/05/2017   Chronic renal disease, stage III ([place]) 04/05/2017   Anemia 04/05/2017   Thoracic back pain 09/15/2016   History of radius fracture 06/01/2016   AKI (acute kidney injury) ([place]) 04/25/2016   Leukocytosis 04/25/2016   Status post cervical spinal fusion 08/18/2015   Acute pain of right shoulder 05/16/2015   Osteoarthritis, hip, bilateral 08/12/2014   Hearing loss in right ear 06/12/2014   Colon cancer screening 08/10/2013   Seborrheic keratoses, inflamed 08/10/2013   History of fall 04/10/2013   Encounter for Medicare annual wellness exam 08/08/2012   Gout 07/14/2012   History of endometrial cancer 05/25/2012   Degenerative joint disease of cervical spine 05/03/2011   Other screening mammogram 03/31/2011   Prediabetes 09/26/2007   Obesity (BMI 30-39.9) 07/12/2007   Essential hypertension 07/11/2007   History of cardiomyopathy 07/11/2007   Osteoarthritis 07/11/2007   MIXED INCONTINENCE URGE AND STRESS 07/11/2007   Hypothyroidism 10/05/2006   HYPERCHOLESTEROLEMIA, PURE 10/05/2006   Past Medical History:  Diagnosis Date   Arthritis    OA   CAD (coronary artery disease)    stent December 2018   Cancer [facility])    endometrial   GERD (gastroesophageal reflux disease)    Hearing loss    History of kidney stones    Hyperlipidemia    Hypertension    Hypothyroid    "not in years"   Shortness of breath dyspnea    04/25/17- not anymore   Past Surgical History:  Procedure Laterality Date   ABDOMINAL HYSTERECTOMY     ANTERIOR CERVICAL DECOMP/DISCECTOMY FUSION [name] 08/18/2015   Procedure: C5-6, C6-7 Anterior Cervical Discectomy and Fusion, Allograft, Plate;   Surgeon: Marybelle Killings, MD;  Location: [facility];  Service: Orthopedics;  Laterality: [name];   bone spur removed Right    shoulder   BOWEL RESECTION  07/04/2012   Procedure: SMALL BOWEL RESECTION;  Surgeon: Alvino Chapel, MD;  Location: [facility];  Service: Gynecology;;   BREAST BIOPSY     CHOLECYSTECTOMY     CORONARY STENT INTERVENTION [name] 04/06/2017   Procedure: CORONARY STENT INTERVENTION;  Surgeon: Wellington Hampshire, MD;  Location: [facility];  Service: Cardiovascular;  Laterality: [name];   DILATION AND CURETTAGE OF UTERUS  1999   EYE SURGERY Bilateral    cataracts   FALSE ANEURYSM REPAIR Right 04/26/2017   Procedure: 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;  Surgeon: Conrad Bridgewater, MD;  Location: [facility];  Service: Vascular;  Laterality: Right;   HAND SURGERY  12/2008   after hand fx/due to fall   JOINT REPLACEMENT  Bilateral 07/1998   knees   KNEE ARTHROPLASTY  05/23/1998   total right   LAPAROTOMY Bilateral 07/04/2012   Procedure: EXPLORATORY LAPAROTOMY TOTAL ABDOMINAL HYSTERECTOMY BILATERAL SALPINGO-OOPHORECTOMY with small bowel resection ;  Surgeon: Alvino Chapel, MD;  Location: [facility];  Service: Gynecology;  Laterality: Bilateral;   LEFT HEART CATH AND CORONARY ANGIOGRAPHY [name] 04/06/2017   Procedure: LEFT HEART CATH AND CORONARY ANGIOGRAPHY;  Surgeon: Jolaine Artist, MD;  Location: [facility];  Service: Cardiovascular;  Laterality: [name];   polyp removal  1999   TUBAL LIGATION     Social History   Tobacco Use   Smoking status: Never   Smokeless tobacco: Never  Vaping Use   Vaping Use: Never used  Substance Use Topics   Alcohol use: Never    Alcohol/week: 0.0 standard drinks of alcohol   Drug use: Never   Family History  Problem Relation Age of Onset   Heart disease Father    Heart attack Father    Leukemia Brother    Lung cancer Brother    Leukemia Brother    Stomach cancer Brother    Heart disease Sister        CABG   Stroke  Sister    Cancer Sister    Bladder Cancer Sister    Heart disease Brother        CABG   Stroke Sister        "massive brain bleed"   Allergies  Allergen Reactions   Allopurinol Nausea Only   Lipitor [Atorvastatin Calcium] Itching and Other (See Comments)    Leg aches and aching all over   Morphine And Related Other (See Comments)    Makes the patient "loopy,"    Simvastatin Other (See Comments)    MUSCLE PAIN   Tape Itching, Rash and Other (See Comments)    "Cannot use paper tape" --- also causes blisters. Use plastic tape   Current Outpatient Medications on File Prior to Visit  Medication Sig Dispense Refill   aspirin EC 81 MG tablet Take 1 tablet (81 mg total) by mouth daily for 20 days. 20 tablet 0   Cholecalciferol (VITAMIN D-3) 25 MCG (1000 UT) CAPS Take 1,000 Units by mouth daily.     Colchicine 0.6 MG CAPS Take 1 capsule by mouth in the morning and at bedtime. (Patient taking differently: Take 0.6 mg by mouth in the morning and at bedtime.) 60 capsule 1   Cyanocobalamin (VITAMIN B-12 PO) Take 1 tablet by mouth daily.     DULoxetine (CYMBALTA) 30 MG capsule TAKE ONE CAPSULE BY MOUTH DAILY (Patient taking differently: Take 30 mg by mouth daily.) 90 capsule 3   FEROSUL 325 (65 Fe) MG tablet TAKE ONE TABLET BY MOUTH DAILY 90 tablet 1   Magnesium Chloride (MAG-SR PLUS CALCIUM PO) Take by mouth. Taking 1 tab once for dizziness     multivitamin-iron-minerals-folic acid (CENTRUM) chewable tablet Chew 1 tablet by mouth daily. 90 tablet 0   nitroGLYCERIN (NITROSTAT) 0.4 MG SL tablet Place 1 tablet (0.4 mg total) under the tongue every 5 (five) minutes x 3 doses as needed for chest pain. (Patient taking differently: Place 0.4 mg under the tongue every 5 (five) minutes as needed for chest pain.) 25 tablet 12   No current facility-administered medications on file prior to visit.    Review of Systems  Constitutional:  Negative for activity change, appetite change, fatigue, fever and  unexpected weight change.  HENT:  Negative for congestion,  ear pain, rhinorrhea, sinus pressure and sore throat.   Eyes:  Positive for visual disturbance. Negative for pain and redness.  Respiratory:  Negative for cough, shortness of breath and wheezing.   Cardiovascular:  Negative for chest pain and palpitations.  Gastrointestinal:  Negative for abdominal pain, blood in stool, constipation and diarrhea.  Endocrine: Negative for polydipsia and polyuria.  Genitourinary:  Negative for dysuria, frequency and urgency.  Musculoskeletal:  Negative for arthralgias, back pain and myalgias.  Skin:  Negative for pallor and rash.  Allergic/Immunologic: Negative for environmental allergies.  Neurological:  Positive for dizziness and headaches. Negative for syncope.  Hematological:  Negative for adenopathy. Does not bruise/bleed easily.  Psychiatric/Behavioral:  Negative for decreased concentration and dysphoric mood. The patient is not nervous/anxious.        Objective:   Physical Exam Constitutional:      General: She is not in acute distress.    Appearance: Normal appearance. She is well-developed. She is obese. She is not ill-appearing or diaphoretic.  HENT:     Head: Normocephalic and atraumatic.     Right Ear: Tympanic membrane and ear canal normal.     Left Ear: Tympanic membrane and ear canal normal.  Eyes:     Conjunctiva/sclera: Conjunctivae normal.     Pupils: Pupils are equal, round, and reactive to light.     Comments: Decreased vision in L eye   Neck:     Thyroid: No thyromegaly.     Vascular: No carotid bruit or JVD.  Cardiovascular:     Rate and Rhythm: Normal rate and regular rhythm.     Heart sounds: Normal heart sounds.     No gallop.  Pulmonary:     Effort: Pulmonary effort is normal. No respiratory distress.     Breath sounds: Normal breath sounds. No wheezing or rales.  Abdominal:     General: There is no distension or abdominal bruit.     Palpations: Abdomen is  soft.  Musculoskeletal:     Cervical back: Normal range of motion and neck supple.     Right lower leg: No edema.     Left lower leg: No edema.     Comments: Grip is symmetric  No tremor  Gait is slow   No facial droop  Lymphadenopathy:     Cervical: No cervical adenopathy.  Skin:    General: Skin is warm and dry.     Coloration: Skin is not pale.     Findings: No rash.  Neurological:     Mental Status: She is alert.     Cranial Nerves: No cranial nerve deficit.     Motor: No weakness.     Coordination: Coordination normal.     Deep Tendon Reflexes: Reflexes are normal and symmetric. Reflexes normal.     Comments: Poor coordination   Psychiatric:        Mood and Affect: Mood normal.     Comments: Argumentative, daughter is here with her  Child like affect -somewhat difficult to communicate with  Changes subject frequently               Assessment & Plan:   Problem List Items Addressed This Visit       Cardiovascular and Mediastinum   Essential hypertension    No meds currently in setting of recent cva and in permissible HTN range  BP: (!) 141/85    Enc low sodium diet  F/u planned with neuro  Pt will sched f/u with  cardiology as well        Relevant Medications   rosuvastatin (CRESTOR) 20 MG tablet     Genitourinary   AKI (acute kidney injury) ([place])    AKI on ckd 3 noted in hospital in setting of cva  Baseline cr of 1.4 (went up to 1.9)  Reviewed hospital records, lab results and studies in detail    Now urine culture is pos for enterobacter as well Will tx with bactrim (resistant to cefazolin)  Enc fluid intake -this is difficult and family will help work on this also       Relevant Orders   Basic metabolic panel   Chronic renal disease, stage III ([place])    Lab today  Valerie improved but may still be worse due to current uti  Will treat this       Relevant Orders   Basic metabolic panel   UTI (urinary tract infection)    Elevated wbc  during hosp for cva Reviewed hospital records, lab results and studies in detail  Culture is now pos for enterobacter tx with bactrim (it is resistant to cefazolin)  Follow closely       Relevant Medications   sulfamethoxazole-trimethoprim (BACTRIM DS) 800-160 MG tablet     Other   H/O: CVA (cerebrovascular accident) - Primary (Chronic)    Another cva recently when already on asa  R PCA territory Likely small vessel cause (nl carotid and echo)  Reviewed hospital records, lab results and studies in detail  She was started on high dose crestor/tolerating well so far  Also plavix with asa for 2 wk, then stop asa  Caution- has had subdural hem in the distant past  Planning f/u with neuro at [facility] (sees Dr Jaynee Eagles)  Some persistent vision change in L eye, eye visit is planned  Some chronic dizziness and headache  bp is permissibly mildly elevated  Poor compliance will be and issue for her  Family lives with her and helps also  Strongly enc not to mow/work in heat or drive Family to take keys away  inst to cal 911 if stroke symptoms return       Fall    Fall off lawnmower with recent stroke  Family will prevent her from high risk activities now such as mowing or driving  Balance is not optimal due to this and vision change and also current uti  Disc imp of hydration and heat avoidance  Disc fall prevention with pt and daughter      History of CVA (cerebrovascular accident)   Relevant Orders   Ambulatory referral to Neurology   Prediabetes    Lab Results  Component Value Date   HGBA1C 6.2 (H) 11/14/2021  disc imp of low glycemic diet and wt loss to prevent DM2

## 2021-11-17 NOTE — Assessment & Plan Note (Signed)
Lab Results  Component Value Date   HGBA1C 6.2 (H) 11/14/2021   disc imp of low glycemic diet and wt loss to prevent DM2

## 2021-11-17 NOTE — Assessment & Plan Note (Signed)
Lab today  Hope improved but may still be worse due to current uti  Will treat this

## 2021-11-23 ENCOUNTER — Encounter: Payer: Self-pay | Admitting: Family Medicine

## 2021-11-23 ENCOUNTER — Telehealth: Payer: Self-pay

## 2021-11-23 NOTE — Progress Notes (Signed)
Chronic Care Management Pharmacy Assistant   Name: Valerie Ramirez  MRN: [id number] DOB: 09/25/1932  Reason for Encounter: Non CCM [facility] Follow Up)   Medications: Outpatient Encounter Medications as of 11/23/2021  Medication Sig   aspirin EC 81 MG tablet Take 1 tablet (81 mg total) by mouth daily for 20 days.   Cholecalciferol (VITAMIN D-3) 25 MCG (1000 UT) CAPS Take 1,000 Units by mouth daily.   clopidogrel (PLAVIX) 75 MG tablet Take 1 tablet (75 mg total) by mouth daily.   Colchicine 0.6 MG CAPS Take 1 capsule by mouth in the morning and at bedtime. (Patient taking differently: Take 0.6 mg by mouth in the morning and at bedtime.)   Cyanocobalamin (VITAMIN B-12 PO) Take 1 tablet by mouth daily.   DULoxetine (CYMBALTA) 30 MG capsule TAKE ONE CAPSULE BY MOUTH DAILY (Patient taking differently: Take 30 mg by mouth daily.)   FEROSUL 325 (65 Fe) MG tablet TAKE ONE TABLET BY MOUTH DAILY   Magnesium Chloride (MAG-SR PLUS CALCIUM PO) Take by mouth. Taking 1 tab once for dizziness   multivitamin-iron-minerals-folic acid (CENTRUM) chewable tablet Chew 1 tablet by mouth daily.   nitroGLYCERIN (NITROSTAT) 0.4 MG SL tablet Place 1 tablet (0.4 mg total) under the tongue every 5 (five) minutes x 3 doses as needed for chest pain. (Patient taking differently: Place 0.4 mg under the tongue every 5 (five) minutes as needed for chest pain.)   rosuvastatin (CRESTOR) 20 MG tablet Take 1 tablet (20 mg total) by mouth daily.   sulfamethoxazole-trimethoprim (BACTRIM DS) 800-160 MG tablet Take 1 tablet by mouth 2 (two) times daily.   No facility-administered encounter medications on file as of 11/23/2021.   Reviewed hospital notes for details of recent visit. Patient has been contacted by Transitions of Care team: No  Admitted to the hospital on 11/13/2021. Discharge date was 11/15/2021.  Dis[facility] from [facility].   Discharge diagnosis (Principal Problem): Acute CVA (cerebrovascular  accident) Patient was discharged to Home  Brief summary of hospital course: 88 y.o. female past medical history significant for coronary artery disease, status post PCI with stenting on aspirin, prior history of CVA presents to [place] [facility] ED for generalized weakness associated with left peripheral vision loss, she relates she was running into things while riding the lawnmower in the ED   CT of the head revealed an old right occipital and right corona radiata infarct and extensive small vessel ischemic changes. MRI revealed patchy small volume acute right PCA distribution CVA, acute to subacute ischemic infarct involving bilateral hemispheres and age related cerebral atrophy. MRA of the head showed patchy small volume right PCA distribution stroke superimposed on chronic right PCA territory infarct subcentimeter acute nonhemorrhagic infarct involving bilateral hemispheres. A1c of 6.9 fasting lipid panel showed HDL 52 LDL of 155 she was started on high-dose statins. MRI of the brain showed acute CVA. Physical therapy evaluated the patient and occupational recommended home health PT. 2D echo showed no shunt preserved EF grade 1 diastolic heart failure. Neurology was consulted recommended aspirin and Plavix for 3 weeks then drop aspirin and continue Plavix. They also recommended high-dose statins. She had no events on telemetry. With a baseline creatinine 1.4 and admission 1.9 she was started on IV fluids and her creatinine improved with IV fluid hydration check a basic metabolic panel in 1 week.   New?Medications Started at [facility] Discharge:?? -Started clopidogrel (PLAVIX) 75 MG tablet -Started rosuvastatin (CRESTOR) 20 MG tablet  Medications Discontinued at Hospital Discharge: -  Stopped [employer] 500-32.5 MG Tabs (Aspirin-Caffeine)  Medications that remain the same after Hospital Discharge:??  -All other medications will remain the same.    Next CCM appt: Patient is non-CCM  Other upcoming  appts: No appointments scheduled within the next 30 days.  Charlene Brooke, PharmD notified and will determine if action is needed.  Charlene Brooke, CPP notified  Marijean Niemann, [place] Clinical Pharmacy Assistant [phone]

## 2021-11-27 ENCOUNTER — Encounter: Payer: Self-pay | Admitting: *Deleted

## 2021-11-30 DIAGNOSIS — H353131 Nonexudative age-related macular degeneration, bilateral, early dry stage: Secondary | ICD-10-CM | POA: Diagnosis not present

## 2021-11-30 DIAGNOSIS — H524 Presbyopia: Secondary | ICD-10-CM | POA: Diagnosis not present

## 2021-12-11 ENCOUNTER — Other Ambulatory Visit: Payer: Self-pay | Admitting: Family Medicine

## 2022-01-06 ENCOUNTER — Ambulatory Visit: Payer: Medicare Other | Admitting: Neurology

## 2022-01-06 ENCOUNTER — Encounter: Payer: Self-pay | Admitting: Neurology

## 2022-01-06 VITALS — BP 170/92 | HR 86 | Ht 62.0 in | Wt 186.2 lb

## 2022-01-06 DIAGNOSIS — M4722 Other spondylosis with radiculopathy, cervical region: Secondary | ICD-10-CM | POA: Diagnosis not present

## 2022-01-06 DIAGNOSIS — I639 Cerebral infarction, unspecified: Secondary | ICD-10-CM

## 2022-01-06 DIAGNOSIS — R208 Other disturbances of skin sensation: Secondary | ICD-10-CM

## 2022-01-06 DIAGNOSIS — G3184 Mild cognitive impairment, so stated: Secondary | ICD-10-CM

## 2022-01-06 MED ORDER — LIDOCAINE 5 % EX PTCH: 1.0000 | MEDICATED_PATCH | CUTANEOUS | 11 refills | Status: AC

## 2022-01-06 MED ORDER — DONEPEZIL HCL 5 MG PO TABS: 5.0000 mg | ORAL_TABLET | Freq: Every day | ORAL | 6 refills | Status: DC

## 2022-01-06 MED ORDER — DICLOFENAC SODIUM 1 % EX GEL: 4.0000 g | Freq: Four times a day (QID) | CUTANEOUS | 11 refills | Status: AC

## 2022-01-06 NOTE — Patient Instructions (Addendum)
- Stroke:  30-day heart monitor and a loop recorder I suspect afib. Sent referral to [facility] and Dr. Rayann Heman. In the meantime, continue plavix, continue managing vascular risk factors like cholesterol, blood pressure. Continue to follow with ophthalmology. Blood pressure is elevated, need to follow up with Dr. Glori Bickers, check it first thing in the morning upon waling, mid day then in evening.   - Cervicalgia/occipital neuralgia. Neck pain and pain radiating to the back of the head.  Discussed physical therapy, medications such as gabapentin or lyrica, voltaren gel (she has some), MRI cervical spine and consider injections such as ESi or medial branch blocks, occipital nerve blocks. At this time she declines options, she just wants to live with it. She has no pain today so hesitate to do occipital nerve bocks today but she can come back anytime. Will give voltaren gel and lidoderm patches. She declines anything else.   - Memory loss; May be normal cognitive aging, or mild cognitive impairment, will continue to follow. Ward Givens to follow memory   Meds ordered this encounter  Medications   diclofenac Sodium (VOLTAREN ARTHRITIS PAIN) 1 % GEL    Sig: Apply 4 g topically 4 (four) times daily.    Dispense:  100 g    Refill:  11   lidocaine (LIDODERM) 5 %    Sig: Place 1 patch onto the skin daily. Remove & Discard patch within 12 hours or as directed by MD    Dispense:  30 patch    Refill:  11   donepezil (ARICEPT) 5 MG tablet    Sig: Take 1 tablet (5 mg total) by mouth at bedtime.    Dispense:  30 tablet    Refill:  6   Orders Placed This Encounter  Procedures   Ambulatory referral to Cardiology   Cardiac event monitor   Memory Compensation Strategies  Use "WARM" strategy.  W= write it down  A= associate it  R= repeat it  M= make a mental note  2.   You can keep a Social worker.  Use a 3-ring notebook with sections for the following: calendar, important names  and phone numbers,  medications, doctors' names/phone numbers, lists/reminders, and a section to journal what you did  each day.   3.    Use a calendar to write appointments down.  4.    Write yourself a schedule for the day.  This can be placed on the calendar or in a separate section of the Memory Notebook.  Keeping a  regular schedule can help memory.  5.    Use medication organizer with sections for each day or morning/evening pills.  You may need help loading it  6.    Keep a basket, or pegboard by the door.  Place items that you need to take out with you in the basket or on the pegboard.  You may also want to  include a message board for reminders.  7.    Use sticky notes.  Place sticky notes with reminders in a place where the task is performed.  For example: " turn off the  stove" placed by the stove, "lock the door" placed on the door at eye level, " take your medications" on  the bathroom mirror or by the place where you normally take your medications.  8.    Use alarms/timers.  Use while cooking to remind yourself to check on food or as a reminder to take your medicine, or as  a  reminder to make a call, or as a reminder to perform another task, etc.   Stroke Prevention Some medical conditions and behaviors can lead to a higher chance of having a stroke. You can help prevent a stroke by eating healthy, exercising, not smoking, and managing any medical conditions you have. Stroke is a leading cause of functional impairment. Primary prevention is particularly important because a majority of strokes are first-time events. Stroke changes the lives of not only those who experience a stroke but also their family and other caregivers. How can this condition affect me? A stroke is a medical emergency and should be treated right away. A stroke can lead to brain damage and can sometimes be life-threatening. If a person gets medical treatment right away, there is a better chance of surviving  and recovering from a stroke. What can increase my risk? The following medical conditions may increase your risk of a stroke: Cardiovascular disease. High blood pressure (hypertension). Diabetes. High cholesterol. Sickle cell disease. Blood clotting disorders (hypercoagulable state). Obesity. Sleep disorders (obstructive sleep apnea). Other risk factors include: Being older than age 85. Having a history of blood clots, stroke, or mini-stroke (transient ischemic attack, TIA). Genetic factors, such as race, ethnicity, or a family history of stroke. Smoking cigarettes or using other tobacco products. Taking birth control pills, especially if you also use tobacco. Heavy use of alcohol or drugs, especially cocaine and methamphetamine. Physical inactivity. What actions can I take to prevent this? Manage your health conditions High cholesterol levels. Eating a healthy diet is important for preventing high cholesterol. If cholesterol cannot be managed through diet alone, you may need to take medicines. Take any prescribed medicines to control your cholesterol as told by your health care provider. Hypertension. To reduce your risk of stroke, try to keep your blood pressure below 130/80. Eating a healthy diet and exercising regularly are important for controlling blood pressure. If these steps are not enough to manage your blood pressure, you may need to take medicines. Take any prescribed medicines to control hypertension as told by your health care provider. Ask your health care provider if you should monitor your blood pressure at home. Have your blood pressure checked every year, even if your blood pressure is normal. Blood pressure increases with age and some medical conditions. Diabetes. Eating a healthy diet and exercising regularly are important parts of managing your blood sugar (glucose). If your blood sugar cannot be managed through diet and exercise, you may need to take  medicines. Take any prescribed medicines to control your diabetes as told by your health care provider. Get evaluated for obstructive sleep apnea. Talk to your health care provider about getting a sleep evaluation if you snore a lot or have excessive sleepiness. Make sure that any other medical conditions you have, such as atrial fibrillation or atherosclerosis, are managed. Nutrition Follow instructions from your health care provider about what to eat or drink to help manage your health condition. These instructions may include: Reducing your daily calorie intake. Limiting how much salt (sodium) you use to 1,500 milligrams (mg) each day. Using only healthy fats for cooking, such as olive oil, canola oil, or sunflower oil. Eating healthy foods. You can do this by: Choosing foods that are high in fiber, such as whole grains, and fresh fruits and vegetables. Eating at least 5 servings of fruits and vegetables a day. Try to fill one-half of your plate with fruits and vegetables at each meal. Choosing lean protein foods,  such as lean cuts of meat, poultry without skin, fish, tofu, beans, and nuts. Eating low-fat dairy products. Avoiding foods that are high in sodium. This can help lower blood pressure. Avoiding foods that have saturated fat, trans fat, and cholesterol. This can help prevent high cholesterol. Avoiding processed and prepared foods. Counting your daily carbohydrate intake.  Lifestyle If you drink alcohol: Limit how much you have to: 0-1 drink a day for women who are not pregnant. 0-2 drinks a day for men. Know how much alcohol is in your drink. In the [place] one drink equals one 12 oz bottle of beer (364m), one 5 oz glass of wine (1428m, or one 1 oz glass of hard liquor (4422m Do not use any products that contain nicotine or tobacco. These products include cigarettes, chewing tobacco, and vaping devices, such as e-cigarettes. If you need help quitting, ask your health care  provider. Avoid secondhand smoke. Do not use drugs. Activity  Try to stay at a healthy weight. Get at least 30 minutes of exercise on most days, such as: Fast walking. Biking. Swimming. Medicines Take over-the-counter and prescription medicines only as told by your health care provider. Aspirin or blood thinners (antiplatelets or anticoagulants) may be recommended to reduce your risk of forming blood clots that can lead to stroke. Avoid taking birth control pills. Talk to your health care provider about the risks of taking birth control pills if: You are over 35 29ars old. You smoke. You get very bad headaches. You have had a blood clot. Where to find more information American Stroke Association: www.strokeassociation.org Get help right away if: You or a loved one has any symptoms of a stroke. "BE FAST" is an easy way to remember the main warning signs of a stroke: B - Balance. Signs are dizziness, sudden trouble walking, or loss of balance. E - Eyes. Signs are trouble seeing or a sudden change in vision. F - Face. Signs are sudden weakness or numbness of the face, or the face or eyelid drooping on one side. A - Arms. Signs are weakness or numbness in an arm. This happens suddenly and usually on one side of the body. S - Speech. Signs are sudden trouble speaking, slurred speech, or trouble understanding what people say. T - Time. Time to call emergency services. Write down what time symptoms started. You or a loved one has other signs of a stroke, such as: A sudden, severe headache with no known cause. Nausea or vomiting. Seizure. These symptoms may represent a serious problem that is an emergency. Do not wait to see if the symptoms will go away. Get medical help right away. Call your local emergency services (911 in the [place] Do not drive yourself to the hospital. Summary You can help to prevent a stroke by eating healthy, exercising, not smoking, limiting alcohol intake, and  managing any medical conditions you may have. Do not use any products that contain nicotine or tobacco. These include cigarettes, chewing tobacco, and vaping devices, such as e-cigarettes. If you need help quitting, ask your health care provider. Remember "BE FAST" for warning signs of a stroke. Get help right away if you or a loved one has any of these signs. This information is not intended to replace advice given to you by your health care provider. Make sure you discuss any questions you have with your health care provider. Document Revised: 11/05/2019 Document Reviewed: 11/05/2019 Elsevier Patient Education  [id number]   Occipital Neuralgia  Occipital neuralgia is a type of headache that causes brief episodes of very bad pain in the back of the head. Pain from occipital neuralgia may spread (radiate) to other parts of the head. These headaches may be caused by irritation of the nerves that leave the spinal cord high up in the neck, just below the base of the skull (occipital nerves). The occipital nerves transmit sensations from the back of the head, the top of the head, and the areas behind the ears. What are the causes? This condition can occur without any known cause (primary headache syndrome). In other cases, this condition is caused by pressure on or irritation of one of the two occipital nerves. Pressure and irritation may be due to: Muscle spasm in the neck. Neck injury. Wear and tear of the vertebrae in the neck (osteoarthritis). Disease of the disks that separate the vertebrae. Swollen blood vessels that put pressure on the occipital nerves. Infections. Tumors. Diabetes. What are the signs or symptoms? This condition causes brief burning, stabbing, electric, shocking, or shooting pain in the back of the head that can radiate to the top of the head. It can happen on one side or both sides of the head. It can also cause: Pain behind the eye. Pain triggered by neck  movement or hair brushing. Scalp tenderness. Aching in the back of the head between episodes of very bad pain. Pain that gets worse with exposure to bright lights. How is this diagnosed? Your health care provider may diagnose the condition based on a physical exam and your symptoms. Tests may be done, such as: Imaging studies of the brain and neck (cervical spine), such as an MRI or CT scan. These look for causes of pinched nerves. Applying pressure to the nerves in the neck to try to re-create the pain. Injection of numbing medicine into the occipital nerve areas to see if pain goes away (diagnostic nerve block). How is this treated? Treatment for this condition may begin with simple measures, such as: Rest. Massage. Applying heat or cold to the area. Over-the-counter pain relievers. If these measures do not work, you may need other treatments, including: Medicines, such as: Prescription-strength anti-inflammatory medicines. Muscle relaxants. Anti-seizure medicines, which can relieve pain. Antidepressants, which can relieve pain. Injected medicines, such as medicines that numb the area (local anesthetic) and steroids. Pulsed radiofrequency ablation. This is when wires are implanted to deliver electrical impulses that block pain signals from the occipital nerve. Surgery to relieve nerve pressure. Physical therapy. Follow these instructions at home: Managing pain     Avoid any activities that cause pain. Rest when you have an attack of pain. Try gentle massage to relieve pain. Try a different pillow or sleeping position. If directed, apply heat to the affected area as often as told by your health care provider. Use the heat source that your health care provider recommends, such as a moist heat pack or a heating pad. Place a towel between your skin and the heat source. Leave the heat on for 20-30 minutes. Remove the heat if your skin turns bright red. This is especially important  if you are unable to feel pain, heat, or cold. You have a greater risk of getting burned. If directed, put ice on the back of your head and neck area. To do this: Put ice in a plastic bag. Place a towel between your skin and the bag. Leave the ice on for 20 minutes, 2-3 times a day. Remove the ice if your  skin turns bright red. This is very important. If you cannot feel pain, heat, or cold, you have a greater risk of damage to the area. General instructions Take over-the-counter and prescription medicines only as told by your health care provider. Avoid things that make your symptoms worse, such as bright lights. Try to stay active. Get regular exercise that does not cause pain. Ask your health care provider to suggest safe exercises for you. Work with a physical therapist to learn stretching exercises you can do at home. Practice good posture. Keep all follow-up visits. This is important. Contact a health care provider if: Your medicine is not working. You have new or worsening symptoms. Get help right away if: You have very bad head pain that does not go away. You have a sudden change in vision, balance, or speech. These symptoms may represent a serious problem that is an emergency. Do not wait to see if the symptoms will go away. Get medical help right away. Call your local emergency services (911 in the [place] Do not drive yourself to the hospital. Summary Occipital neuralgia is a type of headache that causes brief episodes of very bad pain in the back of the head. Pain from occipital neuralgia may spread (radiate) to other parts of the head. Treatment for this condition includes rest, massage, and medicines. This information is not intended to replace advice given to you by your health care provider. Make sure you discuss any questions you have with your health care provider. Document Revised: 02/03/2020 Document Reviewed: 02/03/2020 Elsevier Patient Education  [place].

## 2022-01-06 NOTE — Progress Notes (Signed)
[facility]    Provider:  Dr Jaynee Eagles Requesting Provider: Glori Bickers, Wynelle Fanny, MD Primary Care Provider:  Abner Greenspan, MD  CC:  stroke, occipital neuralgia, memory loss  HPI:  Valerie Ramirez is a 88 y.o. female here as requested by Abner Greenspan, MD for stroke, memory problems and occipital nerulgia. PMHx acute embolic stroke, osteoarthritis, remote history of strokes, occipital neuralgia, history of subdural hematoma, cervical spinal fusion and chronic neck pain, hypertension, hyperlipidemia, CKD, CAD.   - Stroke: I reviewed images and discussed with Dr. Erlinda Hong, head of stroke. Appear to be cardioembolic, need 35-TIR heart monitor and a loop recorder I suspect afib. Sent referral to [facility] and Dr. Rayann Heman. In the meantime, continue plavix, continue managing vascular risk factors like cholesterol, high blood pressure. We reviewed images together, here with daughter [name]. She has followed with ophthalmology. Blood pressure is elevated, need to follow up with Dr. Glori Bickers, check it first thing in the morning upon waling, mid day then in evening. LDL 155. HgbA1c 6.2.  - Cervicalgia/occipital neuralgia. Neck pain and pain radiating to the back of the head. Every single day. Ongoing for years. Feels like somoen has hit her it happens acutely, severe, shooting electric pain. She has had cervical surgery in the past, Dr. Lorin Mercy C5-C7 ACDF with solid bony fusion and also multilevel degenerative changes. Discussed physical therapy, medications such as gabapentin or lyrica, voltaren gel (she has some), MRI cervical spine and consider injections such as ESi or medial branch blocks, occipital nerve blocks. At this time she declines options, she just wants to live with it. She has no pain today so hesitate to do occipital nerve bocks today but she can come back anytime. Will give voltaren gel and lidoderm patches. She declines anything else.   - Memory loss; More short term  memory, repeating things, forgetting appointments. Daughter lives with her and helps her with every appointments, helps with medications. She can perform her own Adls but needs help with IADLs (driving, going to the store and shopping, not driving). She takes B12 and in 2022 (reviewed labs was elevated). She takes vitamin d. No history of alcohol abuse or smoking. No FHx of dementia. No depression or anxiety. She is happy. She declines any further workup, may be normal cognitive aging, MCI, will continue to follow.    Reviewed notes, labs and imaging from outside physicians, which showed:    Carotid ultrasound :  Right Carotid: The extracranial vessels were near-normal with only minimal  wall                 thickening or plaque.   Left Carotid: The extracranial vessels were near-normal with only minimal  wall                thickening or plaque.   Vertebrals:  Bilateral vertebral arteries demonstrate antegrade flow.  Subclavians: Normal flow hemodynamics were seen in bilateral subclavian               arteries.   *See table(s) above for measurements and observations.       Echo 11/14/2021:  IMPRESSIONS     1. Left ventricular ejection fraction, by estimation, is 60 to 65%. The  left ventricle has normal function. The left ventricle has no regional  wall motion abnormalities. Left ventricular diastolic parameters are  consistent with Grade I diastolic  dysfunction (impaired relaxation).   2. Right ventricular systolic function is normal. The right ventricular  size is normal.   3. Left atrial size was mildly dilated.   4. The mitral valve is normal in structure. Trivial mitral valve  regurgitation. No evidence of mitral stenosis.   5. The aortic valve is tricuspid. There is mild calcification of the  aortic valve. Aortic valve regurgitation is not visualized. Aortic valve  sclerosis is present, with no evidence of aortic valve stenosis.   6. The inferior vena cava is normal in  size with greater than 50%  respiratory variability, suggesting right atrial pressure of 3 mmHg.   7. Agitated saline contrast bubble study was negative, with no evidence  of any interatrial shunt.  11/13/2021; MRI brain: IMPRESSION: 1. Patchy small volume acute nonhemorrhagic right PCA distribution infarct as above. This is superimposed on an underlying chronic right PCA territory infarct. 2. Additional scattered subcentimeter acute to early subacute nonhemorrhagic ischemic infarcts involving the bilateral cerebral hemispheres as above. 3. Underlying age-related cerebral atrophy with advanced chronic microvascular ischemic disease, with a few scattered remote ischemic infarcts as above.  CT cervical spein 06/2021: 1. No evidence of acute fracture or traumatic malalignment. 2. C5-C7 ACDF with solid bony fusion. 3. Multilevel degenerative change, detailed above.     Electronically Signed   By: Margaretha Sheffield M.D.   On: 06/24/2021 17:13  11/14/2021 MRA head :   IMPRESSION: 1. Negative for large vessel occlusion. Poor flow in the distal right PCA branches.   2. Intracranial atherosclerosis with other significant stenoses: - Moderate stenosis of the mid Right MCA M1. - Moderate to severe bilateral ACA A2 stenoses. - Severe Left PCA P3 branch stenosis.  Review of Systems: Patient complains of symptoms per HPI as well as the following symptoms memory loss, headache, stoke. Pertinent negatives and positives per HPI. All others negative.   Social History   Socioeconomic History   Marital status: Widowed    Spouse name: Not on file   Number of children: 2   Years of education: Not on file   Highest education level: High school graduate  Occupational History   Not on file  Tobacco Use   Smoking status: Never   Smokeless tobacco: Never  Vaping Use   Vaping Use: Never used  Substance and Sexual Activity   Alcohol use: Never    Alcohol/week: 0.0 standard drinks of alcohol    Drug use: Never   Sexual activity: Never    Birth control/protection: Post-menopausal  Other Topics Concern   Not on file  Social History Narrative   Not on file   Social Determinants of Health   Financial Resource Strain: Low Risk  (07/16/2021)   Overall Financial Resource Strain (CARDIA)    Difficulty of Paying Living Expenses: Not hard at all  Food Insecurity: No Food Insecurity (07/16/2021)   Hunger Vital Sign    Worried About Running Out of Food in the Last Year: Never true    [place] in the Last Year: Never true  Transportation Needs: No Transportation Needs (07/16/2021)   PRAPARE - Hydrologist (Medical): No    Lack of Transportation (Non-Medical): No  Physical Activity: Insufficiently Active (07/16/2021)   Exercise Vital Sign    Days of Exercise per Week: 4 days    Minutes of Exercise per Session: 30 min  Stress: No Stress Concern Present (07/16/2021)   [place]    Feeling of Stress : Not at all  Social Connections: Moderately Isolated (  07/16/2021)   Social Connection and Isolation Panel [NHANES]    Frequency of Communication with Friends and Family: More than three times a week    Frequency of Social Gatherings with Friends and Family: Twice a week    Attends Religious Services: More than 4 times per year    Active Member of [employer] or Organizations: No    Attends Archivist Meetings: Never    Marital Status: Widowed  Intimate Partner Violence: Not At Risk (07/16/2021)   Humiliation, Afraid, Rape, and Kick questionnaire    Fear of Current or Ex-Partner: No    Emotionally Abused: No    Physically Abused: No    Sexually Abused: No    Family History  Problem Relation Age of Onset   Heart disease Father    Heart attack Father    Leukemia Brother    Lung cancer Brother    Leukemia Brother    Stomach cancer Brother    Heart disease Sister        CABG    Stroke Sister    Cancer Sister    Bladder Cancer Sister    Heart disease Brother        CABG   Stroke Sister        "massive brain bleed"    Past Medical History:  Diagnosis Date   Arthritis    OA   CAD (coronary artery disease)    stent December 2018   Cancer [facility])    endometrial   GERD (gastroesophageal reflux disease)    Hearing loss    History of kidney stones    Hyperlipidemia    Hypertension    Hypothyroid    "not in years"   Shortness of breath dyspnea    04/25/17- not anymore    Patient Active Problem List   Diagnosis Date Noted   Acute embolic stroke ([place]) 34/74/2595   Mild cognitive impairment 01/06/2022   Acute CVA (cerebrovascular accident) ([place]) 11/14/2021   Fall 06/24/2021   Syncope and collapse 06/24/2021   Acute pain of both knees 06/24/2021   Light headedness 06/24/2021   Bilateral impacted cerumen 06/24/2021   Heme positive stool 02/24/2021   UTI (urinary tract infection) 03/25/2020   History of CVA (cerebrovascular accident) 02/14/2020   Headache 02/14/2020   Diarrhea 12/26/2017   Dizziness 12/26/2017   Depression 10/26/2017   H/O subdural hemorrhage 09/18/2017   CAD S/P percutaneous coronary angioplasty 09/18/2017   Closed fracture of nasal bones    Epistaxis    Elevated transaminase level 08/03/2017   Radial artery injury, left, sequela 06/15/2017   H/O: CVA (cerebrovascular accident) 05/26/2017   Sick sinus syndrome ([place]) 05/25/2017   Exertional chest pain 04/05/2017   Chronic renal disease, stage III ([place]) 04/05/2017   Anemia 04/05/2017   Thoracic back pain 09/15/2016   History of radius fracture 06/01/2016   AKI (acute kidney injury) ([place]) 04/25/2016   Leukocytosis 04/25/2016   Status post cervical spinal fusion 08/18/2015   Acute pain of right shoulder 05/16/2015   Osteoarthritis, hip, bilateral 08/12/2014   Hearing loss in right ear 06/12/2014   Colon cancer screening 08/10/2013   Seborrheic keratoses, inflamed 08/10/2013    History of fall 04/10/2013   Encounter for Medicare annual wellness exam 08/08/2012   Gout 07/14/2012   History of endometrial cancer 05/25/2012   Degenerative joint disease of cervical spine 05/03/2011   Other screening mammogram 03/31/2011   Prediabetes 09/26/2007   Obesity (BMI 30-39.9) 07/12/2007   Essential hypertension  07/11/2007   History of cardiomyopathy 07/11/2007   Osteoarthritis 07/11/2007   MIXED INCONTINENCE URGE AND STRESS 07/11/2007   Hypothyroidism 10/05/2006   HYPERCHOLESTEROLEMIA, PURE 10/05/2006    Past Surgical History:  Procedure Laterality Date   ABDOMINAL HYSTERECTOMY     ANTERIOR CERVICAL DECOMP/DISCECTOMY FUSION [name] 08/18/2015   Procedure: C5-6, C6-7 Anterior Cervical Discectomy and Fusion, Allograft, Plate;  Surgeon: Marybelle Killings, MD;  Location: [facility];  Service: Orthopedics;  Laterality: [name];   bone spur removed Right    shoulder   BOWEL RESECTION  07/04/2012   Procedure: SMALL BOWEL RESECTION;  Surgeon: Alvino Chapel, MD;  Location: [facility];  Service: Gynecology;;   BREAST BIOPSY     CHOLECYSTECTOMY     CORONARY STENT INTERVENTION [name] 04/06/2017   Procedure: CORONARY STENT INTERVENTION;  Surgeon: Wellington Hampshire, MD;  Location: [facility];  Service: Cardiovascular;  Laterality: [name];   DILATION AND CURETTAGE OF UTERUS  1999   EYE SURGERY Bilateral    cataracts   FALSE ANEURYSM REPAIR Right 04/26/2017   Procedure: 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;  Surgeon: Conrad Storden, MD;  Location: [facility];  Service: Vascular;  Laterality: Right;   HAND SURGERY  12/2008   after hand fx/due to fall   JOINT REPLACEMENT Bilateral 07/1998   knees   KNEE ARTHROPLASTY  05/23/1998   total right   LAPAROTOMY Bilateral 07/04/2012   Procedure: EXPLORATORY LAPAROTOMY TOTAL ABDOMINAL HYSTERECTOMY BILATERAL SALPINGO-OOPHORECTOMY with small bowel resection ;  Surgeon: Alvino Chapel, MD;  Location: [facility];  Service: Gynecology;  Laterality:  Bilateral;   LEFT HEART CATH AND CORONARY ANGIOGRAPHY [name] 04/06/2017   Procedure: LEFT HEART CATH AND CORONARY ANGIOGRAPHY;  Surgeon: Jolaine Artist, MD;  Location: [facility];  Service: Cardiovascular;  Laterality: [name];   polyp removal  1999   TUBAL LIGATION      Current Outpatient Medications  Medication Sig Dispense Refill   Cholecalciferol (VITAMIN D-3) 25 MCG (1000 UT) CAPS Take 1,000 Units by mouth daily.     clopidogrel (PLAVIX) 75 MG tablet Take 1 tablet (75 mg total) by mouth daily. 90 tablet 3   Colchicine 0.6 MG CAPS Take 1 capsule by mouth in the morning and at bedtime. (Patient taking differently: Take 0.6 mg by mouth in the morning and at bedtime.) 60 capsule 1   Cyanocobalamin (VITAMIN B-12 PO) Take 1 tablet by mouth daily.     diclofenac Sodium (VOLTAREN ARTHRITIS PAIN) 1 % GEL Apply 4 g topically 4 (four) times daily. 100 g 11   donepezil (ARICEPT) 5 MG tablet Take 1 tablet (5 mg total) by mouth at bedtime. 30 tablet 6   DULoxetine (CYMBALTA) 30 MG capsule TAKE ONE CAPSULE BY MOUTH DAILY (Patient taking differently: Take 30 mg by mouth daily.) 90 capsule 3   FEROSUL 325 (65 Fe) MG tablet TAKE ONE TABLET BY MOUTH DAILY 90 tablet 1   lidocaine (LIDODERM) 5 % Place 1 patch onto the skin daily. Remove & Discard patch within 12 hours or as directed by MD 30 patch 11   Magnesium Chloride (MAG-SR PLUS CALCIUM PO) Take by mouth. Taking 1 tab once for dizziness     multivitamin-iron-minerals-folic acid (CENTRUM) chewable tablet Chew 1 tablet by mouth daily. 90 tablet 0   nitroGLYCERIN (NITROSTAT) 0.4 MG SL tablet Place 1 tablet (0.4 mg total) under the tongue every 5 (five) minutes x 3 doses as needed for chest pain. (Patient taking differently:  Place 0.4 mg under the tongue every 5 (five) minutes as needed for chest pain.) 25 tablet 12   rosuvastatin (CRESTOR) 20 MG tablet Take 1 tablet (20 mg total) by mouth daily. 90 tablet 3   No current facility-administered  medications for this visit.    Allergies as of 01/06/2022 - Review Complete 01/06/2022  Allergen Reaction Noted   Allopurinol Nausea Only 08/10/2013   Lipitor [atorvastatin calcium] Itching and Other (See Comments) 03/31/2011   Morphine and related Other (See Comments) 10/15/2017   Simvastatin Other (See Comments) 07/29/2008   Tape Itching, Rash, and Other (See Comments) 06/30/2012    Vitals: BP (!) 170/92 (BP Location: Right Arm, Patient Position: Sitting, Cuff Size: Large) Comment (BP Location): manual  Pulse 86   Ht '5\' 2"'$  (1.575 m)   Wt 186 lb 3.2 oz (84.5 kg)   SpO2 99%   BMI 34.06 kg/m  Last Weight:  Wt Readings from Last 1 Encounters:  01/06/22 186 lb 3.2 oz (84.5 kg)   Last Height:   Ht Readings from Last 1 Encounters:  01/06/22 '5\' 2"'$  (1.575 m)     Physical exam: Exam: Gen: NAD, conversant, well nourised, obese, well groomed                     CV: RRR, no MRG. No Carotid Bruits. No peripheral edema, warm, nontender Eyes: Conjunctivae clear without exudates or hemorrhage  Neuro: Detailed Neurologic Exam  Speech:    Speech is normal; fluent and spontaneous with normal comprehension.  Cognition:     01/06/2022   12:26 PM 07/27/2017   11:25 AM 07/26/2016   10:46 AM  MMSE - Mini Mental State Exam  Orientation to time '4 5 5  '$ Orientation to Place '4 5 5  '$ Registration '3 3 3  '$ Attention/ Calculation 1 0 0  Recall '3 3 3  '$ Language- name 2 objects 2 0 0  Language- repeat '1 1 1  '$ Language- follow 3 step command '2 3 3  '$ Language- read & follow direction 1 0 0  Write a sentence 1 0 0  Copy design 0 0 0  Total score '22 20 20   '$ Cranial Nerves:    The pupils are equal, round, and reactive to light. Attempted pupils too small to visualize fundi. Left homonomous hemianopia. Extraocular movements are intact. Trigeminal sensation is intact and the muscles of mastication are normal. The face is symmetric. The palate elevates in the midline. Hearing intact. Voice is normal.  Shoulder shrug is normal. The tongue has normal motion without fasciculations.   Coordination:    Normal   Gait:     Not ataxic.   Motor Observation:    No asymmetry, no atrophy, and no involuntary movements noted. Tone:    Normal muscle tone.    Posture:    Posture is normal. normal erect    Strength: Mild prox leg weakness but symmetrical and can be normal for age otherwise strength is V/V in the upper and lower limbs.      Sensation: intact to LT     Reflex Exam:  DTR's: absent ajs. deep tendon reflexes in the upper and lower extremities are symmetrical  bilaterally.   Toes:    The toes are equiv bilaterally.   Clonus:    Clonus is absent.    Assessment/Plan:   MANAIA SAMAD is a 88 y.o. female here as requested by Abner Greenspan, MD for stroke, memory problems and occipital nerulgia. PMHx acute  embolic stroke, osteoarthritis, remote history of strokes, occipital neuralgia, history of subdural hematoma, cervical spinal fusion and chronic neck pain, hypertension, hyperlipidemia, CKD, CAD.   - Stroke: I reviewed images and discussed with Dr. Erlinda Hong, head of stroke. Appear to be cardioembolic, need 32-TFT heart monitor and a loop recorder I suspect afib. Sent referral to [facility] and Dr. Rayann Heman. In the meantime, continue plavix, continue managing vascular risk factors like cholesterol, high blood pressure. We reviewed images together, here with daughter [name]. She has followed with ophthalmology. Blood pressure is elevated, need to follow up with Dr. Glori Bickers, check it first thing in the morning upon waling, mid day then in evening. LDL 155 goal is 70, [place] started recheck with dr tower. HgbA1c 6.2.   - Cervicalgia/occipital neuralgia. Neck pain and pain radiating to the back of the head. Every single day. Ongoing for years. acutely, severe, shooting electric pain. She has had cervical surgery in the past, Dr. Lorin Mercy C5-C7 ACDF with solid bony fusion and also  multilevel degenerative changes. Discussed physical therapy, medications such as gabapentin or lyrica, voltaren gel (she has some), MRI cervical spine and consider injections such as ESi or medial branch blocks, occipital nerve blocks. At this time she declines options, she just wants to live with it. She has no pain today so hesitate to do occipital nerve bocks today but she can come back anytime. Will give voltaren gel and lidoderm patches. She declines anything else.   - Memory loss; More short term memory, repeating things, forgetting appointments. Daughter lives with her and helps her with every appointments, helps with medications. She can perform her own Adls but needs help with IADLs (driving, going to the store and shopping, not driving). She takes B12 and in 2022 (reviewed labs was elevated). She takes vitamin d. No history of alcohol abuse or smoking. No FHx of dementia. No depression or anxiety. She is happy. She declines any further workup, may be normal cognitive aging, MCI, start aricept, will continue to follow.  MMSE 22/30.   Orders Placed This Encounter  Procedures   Ambulatory referral to Cardiology   Cardiac event monitor   Meds ordered this encounter  Medications   diclofenac Sodium (VOLTAREN ARTHRITIS PAIN) 1 % GEL    Sig: Apply 4 g topically 4 (four) times daily.    Dispense:  100 g    Refill:  11   lidocaine (LIDODERM) 5 %    Sig: Place 1 patch onto the skin daily. Remove & Discard patch within 12 hours or as directed by MD    Dispense:  30 patch    Refill:  11   donepezil (ARICEPT) 5 MG tablet    Sig: Take 1 tablet (5 mg total) by mouth at bedtime.    Dispense:  30 tablet    Refill:  6   - I had a long d/w patient about her recent stroke, risk for recurrent stroke/TIAs, personally independently reviewed imaging studies and stroke evaluation results and answered questions.Continue Plavix  for secondary stroke prevention and maintain strict control of hypertension with  blood pressure goal below 130/90, diabetes with hemoglobin A1c goal below 6.5% and lipids with LDL cholesterol goal below 70 mg/dL. I also advised the patient to eat a healthy diet with plenty of whole grains, lean protein, fruits and vegetables, exercise as tolerated  and maintain ideal body weight. Reviewed prevention of stroke.  Cc: Tower, Wynelle Fanny, MD,  Tower, Wynelle Fanny, MD  Sarina Ill, MD  [facility] [address] [place], [place] [address]  Phone [phone] Fax [phone]  I spent over 100 minutes of face-to-face and non-face-to-face time with patient on the  1. Acute embolic stroke ([place])   2. Osteoarthritis of spine with radiculopathy, cervical region   3. Allodynia   4. Mild cognitive impairment    diagnosis.  This included previsit chart review, lab review, study review, order entry, electronic health record documentation, patient education on the different diagnostic and therapeutic options, counseling and coordination of care, risks and benefits of management, compliance, or risk factor reduction

## 2022-01-07 ENCOUNTER — Encounter: Payer: Self-pay | Admitting: Family Medicine

## 2022-01-07 NOTE — Telephone Encounter (Signed)
Sent mychart letting [name] know appt needs to be made. Also called and no answer so left VM letting her know appt needs to be scheduled or go to UC per Dr. Glori Bickers

## 2022-01-09 ENCOUNTER — Emergency Department: Payer: Medicare Other

## 2022-01-09 ENCOUNTER — Other Ambulatory Visit: Payer: Self-pay

## 2022-01-09 ENCOUNTER — Inpatient Hospital Stay
Admission: EM | Admit: 2022-01-09 | Discharge: 2022-01-12 | DRG: 066 | Disposition: A | Discharge status: Home / Self Care | Payer: Medicare Other | Attending: Internal Medicine | Admitting: Internal Medicine

## 2022-01-09 DIAGNOSIS — R4182 Altered mental status, unspecified: Secondary | ICD-10-CM | POA: Diagnosis not present

## 2022-01-09 DIAGNOSIS — K219 Gastro-esophageal reflux disease without esophagitis: Secondary | ICD-10-CM | POA: Diagnosis present

## 2022-01-09 DIAGNOSIS — Z8673 Personal history of transient ischemic attack (TIA), and cerebral infarction without residual deficits: Secondary | ICD-10-CM | POA: Diagnosis not present

## 2022-01-09 DIAGNOSIS — I2510 Atherosclerotic heart disease of native coronary artery without angina pectoris: Secondary | ICD-10-CM | POA: Diagnosis present

## 2022-01-09 DIAGNOSIS — Z8542 Personal history of malignant neoplasm of other parts of uterus: Secondary | ICD-10-CM | POA: Diagnosis not present

## 2022-01-09 DIAGNOSIS — I10 Essential (primary) hypertension: Secondary | ICD-10-CM | POA: Diagnosis present

## 2022-01-09 DIAGNOSIS — I639 Cerebral infarction, unspecified: Secondary | ICD-10-CM | POA: Diagnosis present

## 2022-01-09 DIAGNOSIS — I129 Hypertensive chronic kidney disease with stage 1 through stage 4 chronic kidney disease, or unspecified chronic kidney disease: Secondary | ICD-10-CM | POA: Diagnosis present

## 2022-01-09 DIAGNOSIS — Z801 Family history of malignant neoplasm of trachea, bronchus and lung: Secondary | ICD-10-CM

## 2022-01-09 DIAGNOSIS — F0150 Vascular dementia without behavioral disturbance: Secondary | ICD-10-CM | POA: Diagnosis not present

## 2022-01-09 DIAGNOSIS — Z20822 Contact with and (suspected) exposure to covid-19: Secondary | ICD-10-CM | POA: Diagnosis not present

## 2022-01-09 DIAGNOSIS — Z79899 Other long term (current) drug therapy: Secondary | ICD-10-CM

## 2022-01-09 DIAGNOSIS — I63423 Cerebral infarction due to embolism of bilateral anterior cerebral arteries: Secondary | ICD-10-CM | POA: Diagnosis not present

## 2022-01-09 DIAGNOSIS — I340 Nonrheumatic mitral (valve) insufficiency: Secondary | ICD-10-CM | POA: Diagnosis not present

## 2022-01-09 DIAGNOSIS — Z66 Do not resuscitate: Secondary | ICD-10-CM | POA: Diagnosis not present

## 2022-01-09 DIAGNOSIS — R4781 Slurred speech: Secondary | ICD-10-CM | POA: Diagnosis not present

## 2022-01-09 DIAGNOSIS — I6389 Other cerebral infarction: Secondary | ICD-10-CM

## 2022-01-09 DIAGNOSIS — N1832 Chronic kidney disease, stage 3b: Secondary | ICD-10-CM | POA: Diagnosis present

## 2022-01-09 DIAGNOSIS — Z8249 Family history of ischemic heart disease and other diseases of the circulatory system: Secondary | ICD-10-CM | POA: Diagnosis not present

## 2022-01-09 DIAGNOSIS — R4701 Aphasia: Secondary | ICD-10-CM | POA: Diagnosis not present

## 2022-01-09 DIAGNOSIS — E669 Obesity, unspecified: Secondary | ICD-10-CM | POA: Diagnosis not present

## 2022-01-09 DIAGNOSIS — Z90710 Acquired absence of both cervix and uterus: Secondary | ICD-10-CM

## 2022-01-09 DIAGNOSIS — Z23 Encounter for immunization: Secondary | ICD-10-CM | POA: Diagnosis not present

## 2022-01-09 DIAGNOSIS — Z8052 Family history of malignant neoplasm of bladder: Secondary | ICD-10-CM

## 2022-01-09 DIAGNOSIS — E039 Hypothyroidism, unspecified: Secondary | ICD-10-CM | POA: Diagnosis not present

## 2022-01-09 DIAGNOSIS — R29810 Facial weakness: Secondary | ICD-10-CM | POA: Diagnosis present

## 2022-01-09 DIAGNOSIS — Z806 Family history of leukemia: Secondary | ICD-10-CM | POA: Diagnosis not present

## 2022-01-09 DIAGNOSIS — H53462 Homonymous bilateral field defects, left side: Secondary | ICD-10-CM | POA: Diagnosis present

## 2022-01-09 DIAGNOSIS — G9389 Other specified disorders of brain: Secondary | ICD-10-CM | POA: Diagnosis present

## 2022-01-09 DIAGNOSIS — M1990 Unspecified osteoarthritis, unspecified site: Secondary | ICD-10-CM | POA: Diagnosis present

## 2022-01-09 DIAGNOSIS — Z800 Family history of malignant neoplasm of digestive organs: Secondary | ICD-10-CM

## 2022-01-09 DIAGNOSIS — I6349 Cerebral infarction due to embolism of other cerebral artery: Secondary | ICD-10-CM | POA: Diagnosis not present

## 2022-01-09 DIAGNOSIS — F01A0 Vascular dementia, mild, without behavioral disturbance, psychotic disturbance, mood disturbance, and anxiety: Secondary | ICD-10-CM | POA: Diagnosis not present

## 2022-01-09 DIAGNOSIS — Z955 Presence of coronary angioplasty implant and graft: Secondary | ICD-10-CM | POA: Diagnosis not present

## 2022-01-09 DIAGNOSIS — Z96651 Presence of right artificial knee joint: Secondary | ICD-10-CM | POA: Diagnosis present

## 2022-01-09 DIAGNOSIS — R29703 NIHSS score 3: Secondary | ICD-10-CM | POA: Diagnosis present

## 2022-01-09 DIAGNOSIS — E785 Hyperlipidemia, unspecified: Secondary | ICD-10-CM | POA: Diagnosis present

## 2022-01-09 DIAGNOSIS — I361 Nonrheumatic tricuspid (valve) insufficiency: Secondary | ICD-10-CM | POA: Diagnosis not present

## 2022-01-09 DIAGNOSIS — Z823 Family history of stroke: Secondary | ICD-10-CM

## 2022-01-09 DIAGNOSIS — Z6833 Body mass index (BMI) 33.0-33.9, adult: Secondary | ICD-10-CM

## 2022-01-09 DIAGNOSIS — Z7902 Long term (current) use of antithrombotics/antiplatelets: Secondary | ICD-10-CM

## 2022-01-09 LAB — CBC
HCT: 38.5 % (ref 36.0–46.0)
HCT: 38.8 % (ref 36.0–46.0)
Hemoglobin: 12.2 g/dL (ref 12.0–15.0)
Hemoglobin: 12.5 g/dL (ref 12.0–15.0)
MCH: 30.2 pg (ref 26.0–34.0)
MCH: 30.4 pg (ref 26.0–34.0)
MCHC: 31.7 g/dL (ref 30.0–36.0)
MCHC: 32.2 g/dL (ref 30.0–36.0)
MCV: 95.3 fL (ref 80.0–100.0)
MCV: 94.4 fL (ref 80.0–100.0)
Platelets: 168 10*3/uL (ref 150–400)
Platelets: 169 10*3/uL (ref 150–400)
RBC: 4.04 MIL/uL (ref 3.87–5.11)
RBC: 4.11 MIL/uL (ref 3.87–5.11)
RDW: 13.4 % (ref 11.5–15.5)
RDW: 13.5 % (ref 11.5–15.5)
WBC: 9.2 10*3/uL (ref 4.0–10.5)
WBC: 8.3 10*3/uL (ref 4.0–10.5)
nRBC: 0 % (ref 0.0–0.2)
nRBC: 0 % (ref 0.0–0.2)

## 2022-01-09 LAB — COMPREHENSIVE METABOLIC PANEL
ALT: 16 U/L (ref 0–44)
AST: 20 U/L (ref 15–41)
Albumin: 3.9 g/dL (ref 3.5–5.0)
Alkaline Phosphatase: 109 U/L (ref 38–126)
Anion gap: 9 (ref 5–15)
BUN: 29 mg/dL — ABNORMAL HIGH (ref 8–23)
CO2: 25 mmol/L (ref 22–32)
Calcium: 9.4 mg/dL (ref 8.9–10.3)
Chloride: 105 mmol/L (ref 98–111)
Creatinine, Ser: 1.57 mg/dL — ABNORMAL HIGH (ref 0.44–1.00)
GFR, Estimated: 32 mL/min — ABNORMAL LOW (ref >60)
Glucose, Bld: 152 mg/dL — ABNORMAL HIGH (ref 70–99)
Potassium: 4.6 mmol/L (ref 3.5–5.1)
Sodium: 139 mmol/L (ref 135–145)
Total Bilirubin: 0.9 mg/dL (ref 0.3–1.2)
Total Protein: 7.5 g/dL (ref 6.5–8.1)

## 2022-01-09 LAB — DIFFERENTIAL
Abs Immature Granulocytes: 0.02 10*3/uL (ref 0.00–0.07)
Basophils Absolute: 0 10*3/uL (ref 0.0–0.1)
Basophils Relative: 0 %
Eosinophils Absolute: 0.2 10*3/uL (ref 0.0–0.5)
Eosinophils Relative: 2 %
Immature Granulocytes: 0 %
Lymphocytes Relative: 22 %
Lymphs Abs: 2 10*3/uL (ref 0.7–4.0)
Monocytes Absolute: 0.8 10*3/uL (ref 0.1–1.0)
Monocytes Relative: 8 %
Neutro Abs: 6.2 10*3/uL (ref 1.7–7.7)
Neutrophils Relative %: 68 %

## 2022-01-09 LAB — PROTIME-INR
INR: 1.1 (ref 0.8–1.2)
Prothrombin Time: 14 seconds (ref 11.4–15.2)

## 2022-01-09 LAB — CREATININE, SERUM
Creatinine, Ser: 1.51 mg/dL — ABNORMAL HIGH (ref 0.44–1.00)
GFR, Estimated: 33 mL/min — ABNORMAL LOW (ref >60)

## 2022-01-09 LAB — RESP PANEL BY RT-PCR (FLU A&B, COVID) ARPGX2
Influenza A by PCR: NEGATIVE
Influenza B by PCR: NEGATIVE
SARS Coronavirus 2 by RT PCR: NEGATIVE

## 2022-01-09 LAB — APTT: aPTT: 38 seconds — ABNORMAL HIGH (ref 24–36)

## 2022-01-09 MED ORDER — ACETAMINOPHEN 650 MG RE SUPP: 650.0000 mg | RECTAL | Status: DC | PRN

## 2022-01-09 MED ORDER — NITROGLYCERIN 0.4 MG SL SUBL: 0.4000 mg | SUBLINGUAL_TABLET | SUBLINGUAL | Status: DC | PRN

## 2022-01-09 MED ORDER — DULOXETINE HCL 30 MG PO CPEP
30.0000 mg | ORAL_CAPSULE | Freq: Every day | ORAL | Status: DC
  Administered 2022-01-09 – 2022-01-12 (×4): 30 mg via ORAL
  Filled 2022-01-09 (×5): qty 1

## 2022-01-09 MED ORDER — ENOXAPARIN SODIUM 30 MG/0.3ML IJ SOSY
30.0000 mg | PREFILLED_SYRINGE | INTRAMUSCULAR | Status: DC
  Administered 2022-01-10 – 2022-01-11 (×2): 30 mg via SUBCUTANEOUS
  Filled 2022-01-09 (×2): qty 0.3

## 2022-01-09 MED ORDER — FERROUS SULFATE 325 (65 FE) MG PO TABS
325.0000 mg | ORAL_TABLET | Freq: Every day | ORAL | Status: DC
  Administered 2022-01-09 – 2022-01-12 (×4): 325 mg via ORAL
  Filled 2022-01-09 (×4): qty 1

## 2022-01-09 MED ORDER — SENNOSIDES-DOCUSATE SODIUM 8.6-50 MG PO TABS: 1.0000 | ORAL_TABLET | Freq: Every evening | ORAL | Status: DC | PRN

## 2022-01-09 MED ORDER — ACETAMINOPHEN 325 MG PO TABS
650.0000 mg | ORAL_TABLET | ORAL | Status: DC | PRN
  Administered 2022-01-10 – 2022-01-11 (×2): 650 mg via ORAL
  Filled 2022-01-09 (×2): qty 2

## 2022-01-09 MED ORDER — ENOXAPARIN SODIUM 40 MG/0.4ML IJ SOSY: 40.0000 mg | PREFILLED_SYRINGE | INTRAMUSCULAR | Status: DC

## 2022-01-09 MED ORDER — STROKE: EARLY STAGES OF RECOVERY BOOK: Freq: Once | Status: AC

## 2022-01-09 MED ORDER — CLOPIDOGREL BISULFATE 75 MG PO TABS
75.0000 mg | ORAL_TABLET | Freq: Every day | ORAL | Status: DC
  Administered 2022-01-09 – 2022-01-12 (×4): 75 mg via ORAL
  Filled 2022-01-09 (×4): qty 1

## 2022-01-09 MED ORDER — ASPIRIN 81 MG PO TBEC
81.0000 mg | DELAYED_RELEASE_TABLET | Freq: Every day | ORAL | Status: DC
  Administered 2022-01-09 – 2022-01-12 (×4): 81 mg via ORAL
  Filled 2022-01-09 (×4): qty 1

## 2022-01-09 MED ORDER — DONEPEZIL HCL 5 MG PO TABS
5.0000 mg | ORAL_TABLET | Freq: Every day | ORAL | Status: DC
  Administered 2022-01-09 – 2022-01-11 (×3): 5 mg via ORAL
  Filled 2022-01-09 (×3): qty 1

## 2022-01-09 MED ORDER — VITAMIN B-12 1000 MCG PO TABS
1000.0000 ug | ORAL_TABLET | Freq: Every day | ORAL | Status: DC
  Administered 2022-01-09 – 2022-01-12 (×4): 1000 ug via ORAL
  Filled 2022-01-09 (×5): qty 1

## 2022-01-09 MED ORDER — ACETAMINOPHEN 160 MG/5ML PO SOLN: 650.0000 mg | ORAL | Status: DC | PRN

## 2022-01-09 NOTE — Consult Note (Signed)
Neurology Consultation Reason for Consult: Concern for stroke Requesting Physician: Merlyn Lot  CC: Altered mental status, intermittent slurred speech  History is obtained from: Family at bedside and chart review  HPI: Valerie Ramirez is a 88 y.o. female with a past medical history of recent admission for work-up of multifocal embolic stroke (9/38/1829, no etiology determined, but no extended cardiac monitoring to date), hypertension, hyperlipidemia, prior left PCA stroke, coronary artery disease s/p stents, remote endometrial cancer, kidney stones.  Family reports that since discharge she has continued to have episodes of left facial droop and right facial droop as well as confusion.  She presented for 1 of these episodes today and was found to again have multifocal stroke on MRI brain  Telemetry reviewed and consistent with sinus arrhythmia at this time   LKW: Unknown due to frequent intermittent symptoms for weeks Thrombolytic given?: No, due to recent stroke IA performed?: No, exam not consistent with new LVO Premorbid modified rankin scale:      2 - Slight disability. Able to look after own affairs without assistance, but unable to carry out all previous activities --baseline left hemianopia from prior stroke but still uses a riding lawnmower to [place] lawns  ROS: All other review of systems was negative except as noted in the HPI; she does have some ongoing intermittent headaches but no new symptoms  Past Medical History:  Diagnosis Date   Arthritis    OA   CAD (coronary artery disease)    stent December 2018   Cancer [facility])    endometrial   GERD (gastroesophageal reflux disease)    Hearing loss    History of kidney stones    Hyperlipidemia    Hypertension    Hypothyroid    "not in years"   Shortness of breath dyspnea    04/25/17- not anymore   Past Surgical History:  Procedure Laterality Date   ABDOMINAL HYSTERECTOMY     ANTERIOR CERVICAL DECOMP/DISCECTOMY FUSION  [name] 08/18/2015   Procedure: C5-6, C6-7 Anterior Cervical Discectomy and Fusion, Allograft, Plate;  Surgeon: Marybelle Killings, MD;  Location: [place];  Service: Orthopedics;  Laterality: [name];   bone spur removed Right    shoulder   BOWEL RESECTION  07/04/2012   Procedure: SMALL BOWEL RESECTION;  Surgeon: Alvino Chapel, MD;  [facility];  Service: Gynecology;;   BREAST BIOPSY     CHOLECYSTECTOMY     CORONARY STENT INTERVENTION [name] 04/06/2017   Procedure: CORONARY STENT INTERVENTION;  Surgeon: Wellington Hampshire, MD;  Location: [place] [facility];  Service: Cardiovascular;  Laterality: [name];   DILATION AND CURETTAGE OF UTERUS  1999   EYE SURGERY Bilateral    cataracts   FALSE ANEURYSM REPAIR Right 04/26/2017   Procedure: 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;  Surgeon: Conrad Anz[place], MD;  Location: [place];  Service: Vascular;  Laterality: Right;   HAND SURGERY  12/2008   after hand fx/due to fall   JOINT REPLACEMENT Bilateral 07/1998   knees   KNEE ARTHROPLASTY  05/23/1998   total right   LAPAROTOMY Bilateral 07/04/2012   Procedure: EXPLORATORY LAPAROTOMY TOTAL ABDOMINAL HYSTERECTOMY BILATERAL SALPINGO-OOPHORECTOMY with small bowel resection ;  Surgeon: Alvino Chapel, MD;  [facility];  Service: Gynecology;  Laterality: Bilateral;   LEFT HEART CATH AND CORONARY ANGIOGRAPHY [name] 04/06/2017   Procedure: LEFT HEART CATH AND CORONARY ANGIOGRAPHY;  Surgeon: Jolaine Artist, MD;  Location: [place] [facility];  Service: Cardiovascular;  Laterality: [name];   polyp removal  1999   TUBAL LIGATION     No current facility-administered medications for this encounter.  Current Outpatient Medications:    Cholecalciferol (VITAMIN D-3) 25 MCG (1000 UT) CAPS, Take 1,000 Units by mouth daily., Disp: , Rfl:    clopidogrel (PLAVIX) 75 MG tablet, Take 1 tablet (75 mg total) by mouth daily., Disp: 90 tablet, Rfl: 3   Colchicine 0.6 MG CAPS, Take 1 capsule by mouth in the morning and  at bedtime. (Patient taking differently: Take 0.6 mg by mouth in the morning and at bedtime.), Disp: 60 capsule, Rfl: 1   Cyanocobalamin (VITAMIN B-12 PO), Take 1 tablet by mouth daily., Disp: , Rfl:    diclofenac Sodium (VOLTAREN ARTHRITIS PAIN) 1 % GEL, Apply 4 g topically 4 (four) times daily., Disp: 100 g, Rfl: 11   donepezil (ARICEPT) 5 MG tablet, Take 1 tablet (5 mg total) by mouth at bedtime., Disp: 30 tablet, Rfl: 6   DULoxetine (CYMBALTA) 30 MG capsule, TAKE ONE CAPSULE BY MOUTH DAILY (Patient taking differently: Take 30 mg by mouth daily.), Disp: 90 capsule, Rfl: 3   FEROSUL 325 (65 Fe) MG tablet, TAKE ONE TABLET BY MOUTH DAILY, Disp: 90 tablet, Rfl: 1   lidocaine (LIDODERM) 5 %, Place 1 patch onto the skin daily. Remove & Discard patch within 12 hours or as directed by MD, Disp: 30 patch, Rfl: 11   Magnesium Chloride (MAG-SR PLUS CALCIUM PO), Take by mouth. Taking 1 tab once for dizziness, Disp: , Rfl:    multivitamin-iron-minerals-folic acid (CENTRUM) chewable tablet, Chew 1 tablet by mouth daily., Disp: 90 tablet, Rfl: 0   nitroGLYCERIN (NITROSTAT) 0.4 MG SL tablet, Place 1 tablet (0.4 mg total) under the tongue every 5 (five) minutes x 3 doses as needed for chest pain. (Patient taking differently: Place 0.4 mg under the tongue every 5 (five) minutes as needed for chest pain.), Disp: 25 tablet, Rfl: 12   rosuvastatin (CRESTOR) 20 MG tablet, Take 1 tablet (20 mg total) by mouth daily., Disp: 90 tablet, Rfl: 3   Family History  Problem Relation Age of Onset   Heart disease Father    Heart attack Father    Leukemia Brother    Lung cancer Brother    Leukemia Brother    Stomach cancer Brother    Heart disease Sister        CABG   Stroke Sister    Cancer Sister    Bladder Cancer Sister    Heart disease Brother        CABG   Stroke Sister        "massive brain bleed"    Social History:  reports that she has never smoked. She has never used smokeless tobacco. She reports that she  does not drink alcohol and does not use drugs.   Exam: Current vital signs: BP (!) 174/70   Pulse 76   Temp 97.9 F (36.6 C) (Oral)   Resp 14   Ht '5\' 2"'$  (1.575 m)   Wt 96.4 kg   SpO2 96%   BMI 38.87 kg/m  Vital signs in last 24 hours: Temp:  [97.9 F (36.6 C)] 97.9 F (36.6 C) (09/23 1103) Pulse Rate:  [76-82] 76 (09/23 1248) Resp:  [14-18] 14 (09/23 1248) BP: (174-189)/(70-94) 174/70 (09/23 1248) SpO2:  [96 %-98 %] 96 % (09/23 1248) Weight:  [96.4 kg] 96.4 kg (09/23 1059)   Physical Exam  Constitutional: Appears well-developed and well-nourished.  Psych: Affect  appropriate to situation, pleasant and cooperative Eyes: No scleral injection HENT: No oropharyngeal obstruction.  MSK: no joint deformities.  Cardiovascular: Normal rate and regular rhythm --sinus arrhythmia at times.  Perfusing extremities well Respiratory: Effort normal, non-labored breathing GI: Soft.  No distension. There is no tenderness.  Skin: Bilateral feet are covered in dirt.  Left upper extremity has multiple healing scabs which are reportedly bites from her cat  Neuro: Mental Status: Patient is awake, alert, oriented to person, place, month, year, and situation. Patient is able to give a clear and coherent history although she is confused about some details No signs of aphasia or neglect Cranial Nerves: II: Visual Fields are notable for a left hemianopia. Pupils are equal, round, and reactive to light.   III,IV, VI: EOMI without ptosis or diploplia.  Mildly saccadic pursuits V: Facial sensation is symmetric to light touch VII: Facial movement is symmetric.  VIII: hearing is intact to voice X: Uvula elevates symmetrically XI: Shoulder shrug is symmetric. XII: tongue is midline without atrophy or fasciculations.  Motor: Tone is normal. Bulk is normal. 5/5 strength was present in all four extremities.  Sensory: Sensation is symmetric to light touch and temperature in the arms and legs. Deep  Tendon Reflexes: 2+ and symmetric in the brachioradialis and patellae.  Plantars: Toes are downgoing bilaterally.  Cerebellar: FNF and HKS are intact bilaterally Gait:  Ambulating at her baseline to the commode, observed with family  NIHSS total 2 Score breakdown: Chronic left hemianopia Performed at 3 PM   I have reviewed labs in epic and the results pertinent to this consultation are:  Basic Metabolic Panel: Recent Labs  Lab 01/09/22 1104  NA 139  K 4.6  CL 105  CO2 25  GLUCOSE 152*  BUN 29*  CREATININE 1.57*  CALCIUM 9.4    CBC: Recent Labs  Lab 01/09/22 1104  WBC 9.2  NEUTROABS 6.2  HGB 12.2  HCT 38.5  MCV 95.3  PLT 168    Coagulation Studies: Recent Labs    01/09/22 1104  LABPROT 14.0  INR 1.1    Lab Results  Component Value Date   HGBA1C 6.2 (H) 11/14/2021   Lab Results  Component Value Date   CHOL 239 (H) 11/14/2021   HDL 52 11/14/2021   LDLCALC 155 (H) 11/14/2021   LDLDIRECT 136.0 10/31/2015   TRIG 161 (H) 11/14/2021   CHOLHDL 4.6 11/14/2021     I have reviewed the images obtained:  MRI brain full radiology read pending, on my review she has multifocal embolic appearing strokes again, left temporal, right thalamic, and left corona radiata  1. Acute/subacute nonhemorrhagic infarct involving the anterior right thalamus. 2. 8 mm acute/subacute nonhemorrhagic infarct of the left centrum semi ovale. 3. Chronic encephalomalacia of the medial right occipital lobe and posteroinferior right parietal lobe. 4. Advanced diffuse white matter disease bilaterally is otherwise similar to the prior exam. This likely reflects the sequela of chronic microvascular ischemia. 5. Bilateral mastoid effusions, right greater than left.  ECHO 11/14/2021  1. Left ventricular ejection fraction, by estimation, is 60 to 65%. The  left ventricle has normal function. The left ventricle has no regional  wall motion abnormalities. Left ventricular diastolic  parameters are  consistent with Grade I diastolic  dysfunction (impaired relaxation).   2. Right ventricular systolic function is normal. The right ventricular  size is normal.   3. Left atrial size was mildly dilated.   4. The mitral valve is normal in structure. Trivial mitral  valve  regurgitation. No evidence of mitral stenosis.   5. The aortic valve is tricuspid. There is mild calcification of the  aortic valve. Aortic valve regurgitation is not visualized. Aortic valve  sclerosis is present, with no evidence of aortic valve stenosis.   6. The inferior vena cava is normal in size with greater than 50%  respiratory variability, suggesting right atrial pressure of 3 mmHg.   7. Agitated saline contrast bubble study was negative, with no evidence  of any interatrial shunt.   Impression: 88 year old presenting with recurrent multifocal embolic appearing strokes concerning for cardioembolic source.  Given her age and her left atrial enlargement I do think she is at high risk for underlying occult atrial fibrillation.  Note if she has a sudden change in neurological status she would not be a candidate for TNK but she would be a candidate for intra-arterial thrombectomy  Recommendations: - MRI brain w/o contrast completed on my recommendation, results as above - Admit to medicine - DAPT for at least 21 days (continue home Plavix, add 21 day course of ASA) - Reach out to cardiology for possible TEE, if cardiology feels repeat TTE would be helpful please do order it - Telemetry, discharge with event monitor in place if no arrhythmias are captured - Frequent neurochecks, activate code stroke for consideration of thrombectomy if patient has sudden significant neurological decline (NIH greater than 6) - No need for PT/OT/SLP at this time as patient is at her baseline - Continue rosuvastatin 20 mg which was started during her last admission at the end of July - No need to repeat A1c/lipid panel at  this time given recent data as above - Neurology will follow along  Lesleigh Noe MD-PhD [facility] [phone] [facility] coverage for [facility] is from 8 AM to 4 AM in-house and 4 PM to 8 PM by telephone/video. 8 PM to 8 AM emergent questions or overnight urgent questions should be addressed to Teleneurology On-call or [facility] neurohospitalist; contact information can be found on AMION

## 2022-01-09 NOTE — H&P (Signed)
History and Physical    Patient: Valerie Ramirez XTG:[id number] DOB: 12-11-1932 DOA: 01/09/2022 DOS: the patient was seen and examined on 01/09/2022 PCP: Abner Greenspan, MD  Patient coming from: Home  Chief Complaint:  Chief Complaint  Patient presents with   Aphasia   HPI: Valerie Ramirez is a 88 y.o. female with medical history significant of coronary artery disease, endometrial cancer, essential hypertension, dyslipidemia, hypothyroidism, and osteoarthritis who came to the hospital with complaints of 2 weeks history of slurred speech. Patient was discharged from the hospital in July with multifocal stroke, was treated with aspirin and Plavix.  Patient states that that she could not tolerate statin including Crestor, she has not been taking it.  She completed stroke work-up, follow-up with neurology for ZIO recorder, which is still in the mail. About 2 weeks ago, patient started have slurred speech, she also lost left-sided pleural vision since last admission.  She did not have any focal weakness, she can still walk for a block.  She had a headache as well as neck pain this morning.  Was given Lidoderm patch.  Upon arriving the emergency room, her blood pressure was elevated at 175/91, lab test showed creatinine of 1.57.  Prior LDL in July was 155 with a total cholesterol of 239.  Patient was given a dose of aspirin and Plavix. Patient has been seen by neurology in the emergency room, recommended keep patient until Monday for telemetry monitoring, obtain TEE.  Review of Systems: As mentioned in the history of present illness. All other systems reviewed and are negative. Past Medical History:  Diagnosis Date   Arthritis    OA   CAD (coronary artery disease)    stent December 2018   Cancer [facility])    endometrial   GERD (gastroesophageal reflux disease)    Hearing loss    History of kidney stones    Hyperlipidemia    Hypertension    Hypothyroid    "not in years"   Shortness of breath  dyspnea    04/25/17- not anymore   Past Surgical History:  Procedure Laterality Date   ABDOMINAL HYSTERECTOMY     ANTERIOR CERVICAL DECOMP/DISCECTOMY FUSION [name] 08/18/2015   Procedure: C5-6, C6-7 Anterior Cervical Discectomy and Fusion, Allograft, Plate;  Surgeon: Marybelle Killings, MD;  Location: [place];  Service: Orthopedics;  Laterality: [name];   bone spur removed Right    shoulder   BOWEL RESECTION  07/04/2012   Procedure: SMALL BOWEL RESECTION;  Surgeon: Alvino Chapel, MD;  [facility];  Service: Gynecology;;   BREAST BIOPSY     CHOLECYSTECTOMY     CORONARY STENT INTERVENTION [name] 04/06/2017   Procedure: CORONARY STENT INTERVENTION;  Surgeon: Wellington Hampshire, MD;  Location: [place] [facility];  Service: Cardiovascular;  Laterality: [name];   DILATION AND CURETTAGE OF UTERUS  1999   EYE SURGERY Bilateral    cataracts   FALSE ANEURYSM REPAIR Right 04/26/2017   Procedure: 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;  Surgeon: Conrad Fennville, MD;  Location: [place];  Service: Vascular;  Laterality: Right;   HAND SURGERY  12/2008   after hand fx/due to fall   JOINT REPLACEMENT Bilateral 07/1998   knees   KNEE ARTHROPLASTY  05/23/1998   total right   LAPAROTOMY Bilateral 07/04/2012   Procedure: EXPLORATORY LAPAROTOMY TOTAL ABDOMINAL HYSTERECTOMY BILATERAL SALPINGO-OOPHORECTOMY with small bowel resection ;  Surgeon: Alvino Chapel, MD;  [facility];  Service: Gynecology;  Laterality: Bilateral;  LEFT HEART CATH AND CORONARY ANGIOGRAPHY [name] 04/06/2017   Procedure: LEFT HEART CATH AND CORONARY ANGIOGRAPHY;  Surgeon: Jolaine Artist, MD;  Location: [place] [facility];  Service: Cardiovascular;  Laterality: [name];   polyp removal  1999   TUBAL LIGATION     Social History:  reports that she has never smoked. She has never used smokeless tobacco. She reports that she does not drink alcohol and does not use drugs.  Allergies  Allergen Reactions   Allopurinol Nausea Only    Lipitor [Atorvastatin Calcium] Itching and Other (See Comments)    Leg aches and aching all over   Morphine And Related Other (See Comments)    Makes the patient "loopy,"    Simvastatin Other (See Comments)    MUSCLE PAIN   Tape Itching, Rash and Other (See Comments)    "Cannot use paper tape" --- also causes blisters. Use plastic tape    Family History  Problem Relation Age of Onset   Heart disease Father    Heart attack Father    Leukemia Brother    Lung cancer Brother    Leukemia Brother    Stomach cancer Brother    Heart disease Sister        CABG   Stroke Sister    Cancer Sister    Bladder Cancer Sister    Heart disease Brother        CABG   Stroke Sister        "massive brain bleed"    Prior to Admission medications   Medication Sig Start Date End Date Taking? Authorizing Provider  Cholecalciferol (VITAMIN D-3) 25 MCG (1000 UT) CAPS Take 1,000 Units by mouth daily.   Yes [provider]  clopidogrel (PLAVIX) 75 MG tablet Take 1 tablet (75 mg total) by mouth daily. 11/17/21  Yes Tower, Wynelle Fanny, MD  Cyanocobalamin (VITAMIN B-12 PO) Take 1 tablet by mouth daily.   Yes [provider]  diclofenac Sodium (VOLTAREN ARTHRITIS PAIN) 1 % GEL Apply 4 g topically 4 (four) times daily. 01/06/22  Yes Melvenia Beam, MD  donepezil (ARICEPT) 5 MG tablet Take 1 tablet (5 mg total) by mouth at bedtime. 01/06/22  Yes Melvenia Beam, MD  DULoxetine (CYMBALTA) 30 MG capsule TAKE ONE CAPSULE BY MOUTH DAILY Patient taking differently: Take 30 mg by mouth daily. 02/24/21  Yes Tower, Wynelle Fanny, MD  FEROSUL 325 (65 Fe) MG tablet TAKE ONE TABLET BY MOUTH DAILY 11/16/21  Yes Tower, Marne A, MD  lidocaine (LIDODERM) 5 % Place 1 patch onto the skin daily. Remove & Discard patch within 12 hours or as directed by MD 01/06/22  Yes Melvenia Beam, MD  Magnesium Chloride (MAG-SR PLUS CALCIUM PO) Take by mouth. Taking 1 tab once for dizziness   Yes [provider]   multivitamin-iron-minerals-folic acid (CENTRUM) chewable tablet Chew 1 tablet by mouth daily. 02/10/21  Yes Carmin Muskrat, MD  rosuvastatin (CRESTOR) 20 MG tablet Take 1 tablet (20 mg total) by mouth daily. 11/17/21  Yes Tower, Wynelle Fanny, MD  Colchicine 0.6 MG CAPS Take 1 capsule by mouth in the morning and at bedtime. Patient taking differently: Take 0.6 mg by mouth in the morning and at bedtime. 08/28/20   Tower, Wynelle Fanny, MD  nitroGLYCERIN (NITROSTAT) 0.4 MG SL tablet Place 1 tablet (0.4 mg total) under the tongue every 5 (five) minutes x 3 doses as needed for chest pain. Patient taking differently: Place 0.4 mg under the tongue every  5 (five) minutes as needed for chest pain. 04/09/17   Lonn Georgia, PA-C    Physical Exam: Vitals:   01/09/22 1500 01/09/22 1502 01/09/22 1600 01/09/22 1617  BP: (!) 149/70  (!) 179/81 (!) 169/88  Pulse: 69  69 70  Resp: '15  16 17  '$ Temp:  97.8 F (36.6 C)    TempSrc:  Oral    SpO2: 100%  100% 98%  Weight:      Height:       Physical Exam Constitutional:      General: She is not in acute distress.    Appearance: Normal appearance. She is not toxic-appearing.  HENT:     Head: Normocephalic and atraumatic.     Nose: Nose normal. No congestion.     Mouth/Throat:     Mouth: Mucous membranes are moist.     Pharynx: Oropharynx is clear.  Eyes:     Conjunctiva/sclera: Conjunctivae normal.     Pupils: Pupils are equal, round, and reactive to light.  Neck:     Vascular: No carotid bruit.  Cardiovascular:     Rate and Rhythm: Normal rate and regular rhythm.     Heart sounds: No murmur heard.    No gallop.  Pulmonary:     Effort: Pulmonary effort is normal.     Breath sounds: Normal breath sounds.  Abdominal:     General: Abdomen is flat. Bowel sounds are normal. There is no distension.     Palpations: Abdomen is soft.     Tenderness: There is no abdominal tenderness.  Musculoskeletal:        General: No swelling or tenderness. Normal range of  motion.     Cervical back: Normal range of motion and neck supple. No rigidity or tenderness.  Skin:    General: Skin is warm and dry.  Neurological:     Mental Status: She is alert and oriented to person, place, and time.     Comments: Mild slurred speech.  Psychiatric:        Mood and Affect: Mood normal.        Behavior: Behavior normal.     Data Reviewed:  CT head did not show any acute changes MRI of the brain: 1. Acute/subacute nonhemorrhagic infarct involving the anterior right thalamus. 2. 8 mm acute/subacute nonhemorrhagic infarct of the left centrum semi ovale. 3. Chronic encephalomalacia of the medial right occipital lobe and posteroinferior right parietal lobe. 4. Advanced diffuse white matter disease bilaterally is otherwise similar to the prior exam. This likely reflects the sequela of chronic microvascular ischemia. 5. Bilateral mastoid effusions, right greater than left.  Assessment and Plan: Acute embolic stroke. Dyslipidemia. Patient has multifocal stroke, consistent with embolic stroke.  This is recurrent, and a third episode since last year. Etiology still unclear, patient did not have documented atrial fibrillation.  Has not set up a ZIO since last admission. Discussed with neurology, patient will be kept in the hospital until Monday to monitor for cardiac arrhythmia.  I also spoke with cardiology, Dr. Davina Poke, for TEE on Monday. Continue aspirin, Plavix.  Patient could not tolerate statin including Crestor. Patient is not a thrombolytic candidate as her symptoms started 2 weeks ago.  Essential hypertension. Blood pressure running high, patient was not taking her blood pressure medicine at home.  Due to new stroke, will not treat current blood pressure.  Coronary artery disease Continue aspirin and Plavix.  Chronic kidney disease stage IIIb Renal function still stable.  Hypothyroidism. Continue  Synthroid.    Advance Care Planning:   Code Status:  DNR   Consults: neurology  Family Communication: Daughter at the bedside.  Severity of Illness: The appropriate patient status for this patient is INPATIENT. Inpatient status is judged to be reasonable and necessary in order to provide the required intensity of service to ensure the patient's safety. The patient's presenting symptoms, physical exam findings, and initial radiographic and laboratory data in the context of their chronic comorbidities is felt to place them at high risk for further clinical deterioration. Furthermore, it is not anticipated that the patient will be medically stable for discharge from the hospital within 2 midnights of admission.   * I certify that at the point of admission it is my clinical judgment that the patient will require inpatient hospital care spanning beyond 2 midnights from the point of admission due to high intensity of service, high risk for further deterioration and high frequency of surveillance required.*  Author: [name], MD 01/09/2022 4:39 PM  For on call review [email or web address].

## 2022-01-09 NOTE — ED Provider Notes (Signed)
[facility] Provider Note    Event Date/Time   First MD Initiated Contact with Patient 01/09/22 1054     (approximate)   History   Aphasia   HPI  Valerie Ramirez is a 88 y.o. female history of cardioembolic CVA presents to the ER for report of slurred speech x2 weeks according to EMS.  Patient denies any pain no weakness no nausea or vomiting no headaches.  No blurry vision.  Says she has been compliant with her medications.  Requesting discharge home.     Physical Exam   Triage Vital Signs: ED Triage Vitals  Enc Vitals Group     BP 01/09/22 1103 (!) 189/94     Pulse Rate 01/09/22 1103 82     Resp 01/09/22 1103 18     Temp 01/09/22 1103 97.9 F (36.6 C)     Temp Source 01/09/22 1103 Oral     SpO2 01/09/22 1103 96 %     Weight 01/09/22 1059 212 lb 8.4 oz (96.4 kg)     Height 01/09/22 1059 '5\' 2"'$  (1.575 m)     Head Circumference --      Peak Flow --      Pain Score 01/09/22 1101 0     Pain Loc --      Pain Edu? --      Excl. in [place]? --     Most recent vital signs: Vitals:   01/09/22 1451 01/09/22 1502  BP: (!) 145/71   Pulse: 77   Resp: 16   Temp:  97.8 F (36.6 C)  SpO2: 98%      Constitutional: Alert  Eyes: Conjunctivae are normal.  Head: Atraumatic. Nose: No congestion/rhinnorhea. Mouth/Throat: Mucous membranes are moist.   Neck: Painless ROM.  Cardiovascular:   Good peripheral circulation. Respiratory: Normal respiratory effort.  No retractions.  Gastrointestinal: Soft and nontender.  Musculoskeletal:  no deformity Neurologic:  MAE spontaneously. No gross focal neurologic deficits are appreciated.  Skin:  Skin is warm, dry and intact. No rash noted. Psychiatric: Mood and affect are normal. Speech and behavior are normal.    ED Results / Procedures / Treatments   Labs (all labs ordered are listed, but only abnormal results are displayed) Labs Reviewed  APTT - Abnormal; Notable for the following components:      Result  Value   aPTT 38 (*)    All other components within normal limits  COMPREHENSIVE METABOLIC PANEL - Abnormal; Notable for the following components:   Glucose, Bld 152 (*)    BUN 29 (*)    Creatinine, Ser 1.57 (*)    GFR, Estimated 32 (*)    All other components within normal limits  RESP PANEL BY RT-PCR (FLU A&B, COVID) ARPGX2  PROTIME-INR  CBC  DIFFERENTIAL  URINE DRUG SCREEN, QUALITATIVE ([facility] ONLY)  URINALYSIS, ROUTINE W REFLEX MICROSCOPIC     EKG  ED ECG REPORT I, Merlyn Lot, the attending physician, personally viewed and interpreted this ECG.   Date: 01/09/2022  EKG Time: 11:27  Rate: 80  Rhythm: sinus  Axis: lead reversal  Intervals:normalqt  ST&T Change: no stemi, no depressions    RADIOLOGY Please see ED Course for my review and interpretation.  I personally reviewed all radiographic images ordered to evaluate for the above acute complaints and reviewed radiology reports and findings.  These findings were personally discussed with the patient.  Please see medical record for radiology report.    PROCEDURES:  Critical Care performed:  Procedures   MEDICATIONS ORDERED IN ED: Medications - No data to display   IMPRESSION / MDM / [place] / ED COURSE  I reviewed the triage vital signs and the nursing notes.                              Differential diagnosis includes, but is not limited to, cva, tia, hypoglycemia, dehydration, electrolyte abnormality, dissection, sepsis   Patient presented to the ER for evaluation of symptoms as described above.  This presenting complaint could reflect a potentially life-threatening illness therefore the patient will be placed on continuous pulse oximetry and telemetry for monitoring.  Laboratory evaluation will be sent to evaluate for the above complaints.  Imaging ordered for the above differential.    Clinical Course as of 01/09/22 1527  Sat Jan 09, 2022  1238 CT had a my review and interpretation  without evidence of bleed. [PR]  2119 Given the patient's risk factors and recent stroke I discussed the case in consultation with Dr. Curly [place] of neurology.  Has recommended MRI and will evaluate patient. [PR]  4174 MRI with acute strokes.  Patient has significant atherosclerotic disease.  Patient being evaluated by neurology at bedside. [PR]  0814 Patient signed out to oncoming physician pending follow-up neuro recommendations.  There is report of A-fib on monitor but on review it appears likely artifactual and twelve-lead shows evidence of sinus dysrhythmia. [PR]  1527 Patient has been evaluated by neurology at bedside given her functional status presentation concerning for multiple events occurring over the past few days with concern for embolic process will admit to hospitalist for telemetry and further monitoring and reevaluation. [PR]    Clinical Course User Index [PR] Merlyn Lot, MD     FINAL CLINICAL IMPRESSION(S) / ED DIAGNOSES   Final diagnoses:  Cerebrovascular accident (CVA), unspecified mechanism ([place])     Rx / DC Orders   ED Discharge Orders     None        Note:  This document was prepared using Dragon voice recognition software and may include unintentional dictation errors.    Merlyn Lot, MD 01/09/22 [phone]

## 2022-01-09 NOTE — ED Triage Notes (Signed)
Patient to ED via [facility] from home for slurred speech x2 week. Patient states that family today stated her speech was slurred but patient states she does not think it is slurred. Patient states she saw her neurologist on Wed. Hx of stroke currently on Plavix.

## 2022-01-09 NOTE — Progress Notes (Signed)
   01/09/22 1743  Assess: MEWS Score  Temp 98.7 F (37.1 C)  BP (!) 201/94  MAP (mmHg) 116  Pulse Rate 76  Resp 17  SpO2 97 %  O2 Device Room Air  Assess: MEWS Score  MEWS Temp 0  MEWS Systolic 2  MEWS Pulse 0  MEWS RR 0  MEWS LOC 0  MEWS Score 2  MEWS Score Color Yellow  Assess: if the MEWS score is Yellow or Red  Were vital signs taken at a resting state? Yes  Focused Assessment No change from prior assessment  Does the patient meet 2 or more of the SIRS criteria? No  MEWS guidelines implemented *See Row Information* Yes  Treat  MEWS Interventions Other (Comment)  Take Vital Signs  Increase Vital Sign Frequency  Yellow: Q 2hr X 2 then Q 4hr X 2, if remains yellow, continue Q 4hrs  Escalate  MEWS: Escalate Yellow: discuss with charge nurse/RN and consider discussing with provider and RRT  Notify: Charge Nurse/RN  Name of Charge Nurse/RN Notified Malka, RN  Date Charge Nurse/RN Notified 01/09/22  Time Charge Nurse/RN Notified 1750  Notify: Provider  Provider Name/Title Dr. Roosevelt Locks  Date Provider Notified 01/09/22  Time Provider Notified 1745  Method of Notification Page  Notification Reason Other (Comment) (BP 201/94)  Provider response No new orders (No worries until 220/120)  Date of Provider Response 01/09/22  Time of Provider Response 1745  Assess: SIRS CRITERIA  SIRS Temperature  0  SIRS Pulse 0  SIRS Respirations  0  SIRS WBC 1  SIRS Score Sum  1

## 2022-01-10 DIAGNOSIS — F01A0 Vascular dementia, mild, without behavioral disturbance, psychotic disturbance, mood disturbance, and anxiety: Secondary | ICD-10-CM

## 2022-01-10 DIAGNOSIS — I639 Cerebral infarction, unspecified: Secondary | ICD-10-CM | POA: Diagnosis not present

## 2022-01-10 DIAGNOSIS — N1832 Chronic kidney disease, stage 3b: Secondary | ICD-10-CM | POA: Diagnosis not present

## 2022-01-10 DIAGNOSIS — F0150 Vascular dementia without behavioral disturbance: Secondary | ICD-10-CM

## 2022-01-10 DIAGNOSIS — I10 Essential (primary) hypertension: Secondary | ICD-10-CM

## 2022-01-10 LAB — URINE DRUG SCREEN, QUALITATIVE (ARMC ONLY)
Amphetamines, Ur Screen: NOT DETECTED
Barbiturates, Ur Screen: NOT DETECTED
Benzodiazepine, Ur Scrn: NOT DETECTED
Cannabinoid 50 Ng, Ur ~~LOC~~: NOT DETECTED
Cocaine Metabolite,Ur ~~LOC~~: NOT DETECTED
MDMA (Ecstasy)Ur Screen: NOT DETECTED
Methadone Scn, Ur: NOT DETECTED
Opiate, Ur Screen: NOT DETECTED
Phencyclidine (PCP) Ur S: NOT DETECTED
Tricyclic, Ur Screen: NOT DETECTED

## 2022-01-10 LAB — URINALYSIS, ROUTINE W REFLEX MICROSCOPIC
Bilirubin Urine: NEGATIVE
Glucose, UA: NEGATIVE mg/dL
Hgb urine dipstick: NEGATIVE
Ketones, ur: NEGATIVE mg/dL
Leukocytes,Ua: NEGATIVE
Nitrite: NEGATIVE
Protein, ur: 100 mg/dL — AB
Specific Gravity, Urine: <1.005 — ABNORMAL LOW (ref 1.005–1.030)
pH: 5 (ref 5.0–8.0)

## 2022-01-10 LAB — LIPID PANEL
Cholesterol: 130 mg/dL (ref 0–200)
HDL: 52 mg/dL (ref >40)
LDL Cholesterol: 53 mg/dL (ref 0–99)
Total CHOL/HDL Ratio: 2.5 RATIO
Triglycerides: 125 mg/dL (ref <150)
VLDL: 25 mg/dL (ref 0–40)

## 2022-01-10 LAB — URINALYSIS, MICROSCOPIC (REFLEX)

## 2022-01-10 NOTE — Consult Note (Signed)
Cardiology Consultation:  Patient ID: Valerie Ramirez MRN: [id number]; DOB: 1932-10-23  Admit date: 01/09/2022 Date of Consult: 01/10/2022  Primary Care Provider: Abner Greenspan, MD Primary Cardiologist: Peter Martinique, MD  Primary Electrophysiologist:  None   Patient Profile:  Valerie Ramirez is a 88 y.o. female with a hx of hypertension, stroke, endometrial cancer who is being seen today for the evaluation of stroke at the request of Valerie Hones, MD.  History of Present Illness:  [name] presents with 2 weeks of slurred speech.  Apparently she suffered a stroke in July of this year.  She had plans for work-up of this and was seen by neurology last week.  They had concerns for cardioembolic source.  She seems to have not been taking her medications either.  She has no focal weakness.  She has no difficulty swallowing.  Cardiology was consulted for transesophageal echocardiogram evaluation.  I did review her echocardiogram from July which shows a negative bubble study.  She has normal LV function.  Her telemetry shows no evidence of atrial fibrillation.  An outpatient monitor was planned but she has not completed this yet.  She denies any chest pain or trouble breathing.  She is stable at the time my examination.  Again there were no contraindications to transesophageal echocardiogram.   Brain MRI from this admission shows evolving right thalamic stroke.  There is also mention of other lacunar infarcts.  She appears to have chronic microvascular ischemia noted as well.  Heart Pathway Score:       Past Medical History: Past Medical History:  Diagnosis Date   Arthritis    OA   CAD (coronary artery disease)    stent December 2018   Cancer [facility])    endometrial   GERD (gastroesophageal reflux disease)    Hearing loss    History of kidney stones    Hyperlipidemia    Hypertension    Hypothyroid    "not in years"   Shortness of breath dyspnea    04/25/17- not anymore    Past Surgical  History: Past Surgical History:  Procedure Laterality Date   ABDOMINAL HYSTERECTOMY     ANTERIOR CERVICAL DECOMP/DISCECTOMY FUSION [name] 08/18/2015   Procedure: C5-6, C6-7 Anterior Cervical Discectomy and Fusion, Allograft, Plate;  Surgeon: Valerie Killings, MD;  Location: [place];  Service: Orthopedics;  Laterality: [name];   bone spur removed Right    shoulder   BOWEL RESECTION  07/04/2012   Procedure: SMALL BOWEL RESECTION;  Surgeon: Valerie Chapel, MD;  Location: [facility];  Service: Gynecology;;   BREAST BIOPSY     CHOLECYSTECTOMY     CORONARY STENT INTERVENTION [name] 04/06/2017   Procedure: CORONARY STENT INTERVENTION;  Surgeon: Valerie Hampshire, MD;  Location: [place] [facility];  Service: Cardiovascular;  Laterality: [name];   DILATION AND CURETTAGE OF UTERUS  1999   EYE SURGERY Bilateral    cataracts   FALSE ANEURYSM REPAIR Right 04/26/2017   Procedure: 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;  Surgeon: Conrad Blanding, MD;  Location: [place];  Service: Vascular;  Laterality: Right;   HAND SURGERY  12/2008   after hand fx/due to fall   JOINT REPLACEMENT Bilateral 07/1998   knees   KNEE ARTHROPLASTY  05/23/1998   total right   LAPAROTOMY Bilateral 07/04/2012   Procedure: EXPLORATORY LAPAROTOMY TOTAL ABDOMINAL HYSTERECTOMY BILATERAL SALPINGO-OOPHORECTOMY with small bowel resection ;  Surgeon: Valerie Chapel, MD;  Location: [facility];  Service: Gynecology;  Laterality: Bilateral;   LEFT HEART CATH AND CORONARY ANGIOGRAPHY [name] 04/06/2017   Procedure: LEFT HEART CATH AND CORONARY ANGIOGRAPHY;  Surgeon: Valerie Artist, MD;  Location: [place] [facility];  Service: Cardiovascular;  Laterality: [name];   polyp removal  1999   TUBAL LIGATION       Home Medications:  Prior to Admission medications   Medication Sig Start Date End Date Taking? Authorizing Provider  Cholecalciferol (VITAMIN D-3) 25 MCG (1000 UT) CAPS Take 1,000 Units by mouth daily.   Yes [provider]   clopidogrel (PLAVIX) 75 MG tablet Take 1 tablet (75 mg total) by mouth daily. 11/17/21  Yes Tower, Wynelle Fanny, MD  Cyanocobalamin (VITAMIN B-12 PO) Take 1 tablet by mouth daily.   Yes [provider]  diclofenac Sodium (VOLTAREN ARTHRITIS PAIN) 1 % GEL Apply 4 g topically 4 (four) times daily. 01/06/22  Yes Melvenia Beam, MD  donepezil (ARICEPT) 5 MG tablet Take 1 tablet (5 mg total) by mouth at bedtime. 01/06/22  Yes Melvenia Beam, MD  DULoxetine (CYMBALTA) 30 MG capsule TAKE ONE CAPSULE BY MOUTH DAILY Patient taking differently: Take 30 mg by mouth daily. 02/24/21  Yes Tower, Wynelle Fanny, MD  FEROSUL 325 (65 Fe) MG tablet TAKE ONE TABLET BY MOUTH DAILY 11/16/21  Yes Tower, Marne A, MD  lidocaine (LIDODERM) 5 % Place 1 patch onto the skin daily. Remove & Discard patch within 12 hours or as directed by MD 01/06/22  Yes Melvenia Beam, MD  Magnesium Chloride (MAG-SR PLUS CALCIUM PO) Take by mouth. Taking 1 tab once for dizziness   Yes [provider]  multivitamin-iron-minerals-folic acid (CENTRUM) chewable tablet Chew 1 tablet by mouth daily. 02/10/21  Yes Valerie Muskrat, MD  rosuvastatin (CRESTOR) 20 MG tablet Take 1 tablet (20 mg total) by mouth daily. 11/17/21  Yes Tower, Wynelle Fanny, MD  Colchicine 0.6 MG CAPS Take 1 capsule by mouth in the morning and at bedtime. Patient taking differently: Take 0.6 mg by mouth in the morning and at bedtime. 08/28/20   Tower, Wynelle Fanny, MD  nitroGLYCERIN (NITROSTAT) 0.4 MG SL tablet Place 1 tablet (0.4 mg total) under the tongue every 5 (five) minutes x 3 doses as needed for chest pain. Patient taking differently: Place 0.4 mg under the tongue every 5 (five) minutes as needed for chest pain. 04/09/17   [name], Valerie Croon, PA-C    Inpatient Medications: Scheduled Meds:   stroke: early stages of recovery book   Does not apply Once   aspirin EC  81 mg Oral Daily   clopidogrel  75 mg Oral Daily   vitamin B-12  1,000 mcg Oral Daily   donepezil  5 mg Oral  QHS   DULoxetine  30 mg Oral Daily   enoxaparin (LOVENOX) injection  30 mg Subcutaneous Q24H   ferrous sulfate  325 mg Oral Daily   Continuous Infusions:  PRN Meds: acetaminophen **OR** acetaminophen (TYLENOL) oral liquid 160 mg/5 mL **OR** acetaminophen, nitroGLYCERIN, senna-docusate  Allergies:    Allergies  Allergen Reactions   Allopurinol Nausea Only   Lipitor [Atorvastatin Calcium] Itching and Other (See Comments)    Leg aches and aching all over   Morphine And Related Other (See Comments)    Makes the patient "loopy,"    Simvastatin Other (See Comments)    MUSCLE PAIN   Tape Itching, Rash and Other (See Comments)    "Cannot use paper tape" --- also causes blisters. Use plastic tape  Social History:   Social History   Socioeconomic History   Marital status: Widowed    Spouse name: Not on file   Number of children: 2   Years of education: Not on file   Highest education level: High school graduate  Occupational History   Not on file  Tobacco Use   Smoking status: Never   Smokeless tobacco: Never  Vaping Use   Vaping Use: Never used  Substance and Sexual Activity   Alcohol use: Never    Alcohol/week: 0.0 standard drinks of alcohol   Drug use: Never   Sexual activity: Never    Birth control/protection: Post-menopausal  Other Topics Concern   Not on file  Social History Narrative   Not on file   Social Determinants of Health   Financial Resource Strain: Low Risk  (07/16/2021)   Overall Financial Resource Strain (CARDIA)    Difficulty of Paying Living Expenses: Not hard at all  Food Insecurity: No Food Insecurity (01/09/2022)   Hunger Vital Sign    Worried About Running Out of Food in the Last Year: Never true    [place] in the Last Year: Never true  Transportation Needs: No Transportation Needs (01/09/2022)   PRAPARE - Hydrologist (Medical): No    Lack of Transportation (Non-Medical): No  Physical Activity:  Insufficiently Active (07/16/2021)   Exercise Vital Sign    Days of Exercise per Week: 4 days    Minutes of Exercise per Session: 30 min  Stress: No Stress Concern Present (07/16/2021)   [place]    Feeling of Stress : Not at all  Social Connections: Moderately Isolated (07/16/2021)   Social Connection and Isolation Panel [NHANES]    Frequency of Communication with Friends and Family: More than three times a week    Frequency of Social Gatherings with Friends and Family: Twice a week    Attends Religious Services: More than 4 times per year    Active Member of [employer] or Organizations: No    Attends Archivist Meetings: Never    Marital Status: Widowed  Intimate Partner Violence: Not At Risk (01/09/2022)   Humiliation, Afraid, Rape, and Kick questionnaire    Fear of Current or Ex-Partner: No    Emotionally Abused: No    Physically Abused: No    Sexually Abused: No     Family History:    Family History  Problem Relation Age of Onset   Heart disease Father    Heart attack Father    Leukemia Brother    Lung cancer Brother    Leukemia Brother    Stomach cancer Brother    Heart disease Sister        CABG   Stroke Sister    Cancer Sister    Bladder Cancer Sister    Heart disease Brother        CABG   Stroke Sister        "massive brain bleed"     ROS:  All other ROS reviewed and negative. Pertinent positives noted in the HPI.     Physical Exam/Data:   Vitals:   01/10/22 0005 01/10/22 0421 01/10/22 0739 01/10/22 0807  BP: (!) 186/96 (!) 171/102 (!) 165/96 (!) 183/107  Pulse: 96 (!) 101 (!) 102 (!) 102  Resp: '18 16 17 16  '$ Temp: 98.4 F (36.9 C) 99 F (37.2 C) 98.6 F (37 C) 98.2 F (36.8  C)  TempSrc:   Oral   SpO2: 96% 94% 94% 96%  Weight:      Height:       No intake or output data in the 24 hours ending 01/10/22 1059     01/09/2022   10:59 AM 01/06/2022   10:21 AM 11/17/2021   12:00 PM   Last 3 Weights  Weight (lbs) 212 lb 8.4 oz 186 lb 3.2 oz 184 lb 6.4 oz  Weight (kg) 96.4 kg 84.46 kg 83.643 kg    Body mass index is 38.87 kg/m.  General: Well nourished, well developed, in no acute distress Head: Atraumatic, normal size  Eyes: PEERLA, EOMI  Neck: Supple, no JVD Endocrine: No thryomegaly Cardiac: Normal S1, S2; RRR; no murmurs, rubs, or gallops Lungs: Clear to auscultation bilaterally, no wheezing, rhonchi or rales  Abd: Soft, nontender, no hepatomegaly  Ext: No edema, pulses 2+ Musculoskeletal: No deformities, BUE and BLE strength normal and equal Skin: Warm and dry, no rashes   Neuro: Alert and oriented to person, place, time, and situation, CNII-XII grossly intact, no focal deficits  Psych: Normal mood and affect   EKG:  The EKG was personally reviewed and demonstrates: Sinus arrhythmia heart rate 73, no acute ischemic changes Telemetry:  Telemetry was personally reviewed and demonstrates: Sinus rhythm  Relevant CV Studies: TTE 11/14/2021   1. Left ventricular ejection fraction, by estimation, is 60 to 65%. The  left ventricle has normal function. The left ventricle has no regional  wall motion abnormalities. Left ventricular diastolic parameters are  consistent with Grade I diastolic  dysfunction (impaired relaxation).   2. Right ventricular systolic function is normal. The right ventricular  size is normal.   3. Left atrial size was mildly dilated.   4. The mitral valve is normal in structure. Trivial mitral valve  regurgitation. No evidence of mitral stenosis.   5. The aortic valve is tricuspid. There is mild calcification of the  aortic valve. Aortic valve regurgitation is not visualized. Aortic valve  sclerosis is present, with no evidence of aortic valve stenosis.   6. The inferior vena cava is normal in size with greater than 50%  respiratory variability, suggesting right atrial pressure of 3 mmHg.   7. Agitated saline contrast bubble study was  negative, with no evidence  of any interatrial shunt.   Laboratory Data: High Sensitivity Troponin:  No results for input(s): "TROPONINIHS" in the last 720 hours.   Cardiac EnzymesNo results for input(s): "TROPONINI" in the last 168 hours. No results for input(s): "TROPIPOC" in the last 168 hours.  Chemistry Recent Labs  Lab 01/09/22 1104 01/09/22 1802  NA 139  --   K 4.6  --   CL 105  --   CO2 25  --   GLUCOSE 152*  --   BUN 29*  --   CREATININE 1.57* 1.51*  CALCIUM 9.4  --   GFRNONAA 32* 33*  ANIONGAP 9  --     Recent Labs  Lab 01/09/22 1104  PROT 7.5  ALBUMIN 3.9  AST 20  ALT 16  ALKPHOS 109  BILITOT 0.9   Hematology Recent Labs  Lab 01/09/22 1104 01/09/22 1802  WBC 9.2 8.3  RBC 4.04 4.11  HGB 12.2 12.5  HCT 38.5 38.8  MCV 95.3 94.4  MCH 30.2 30.4  MCHC 31.7 32.2  RDW 13.4 13.5  PLT 168 169   BNPNo results for input(s): "BNP", "PROBNP" in the last 168 hours.  DDimer No results for input(s): "  DDIMER" in the last 168 hours.  Radiology/Studies:  MR BRAIN WO CONTRAST  Result Date: 01/09/2022 CLINICAL DATA:  TiA. Stroke follow-up. Slurred speech 2 weeks. EXAM: MRI HEAD WITHOUT CONTRAST TECHNIQUE: Multiplanar, multiecho pulse sequences of the brain and surrounding structures were obtained without intravenous contrast. COMPARISON:  CT head without contrast 01/09/2022. MR head without contrast 11/13/2021 FINDINGS: Brain: Acute/subacute nonhemorrhagic infarct is present in the anterior right thalamus. A second acute/subacute nonhemorrhagic infarct is present in the left centrum semi ovale measuring 8 mm. T2 and FLAIR hyperintensities are associated with both of the acute/subacute infarcts. Chronic encephalomalacia of the medial right occipital lobe and posteroinferior right parietal lobe is stable. Advanced diffuse white matter disease is otherwise similar to the prior exam. No acute hemorrhage or mass lesion is present. White matter changes extend into the brainstem.  Cerebellum is unremarkable. The internal auditory canals are within normal limits. No significant extraaxial fluid collection is present. Vascular: Flow is present in the major intracranial arteries. Skull and upper cervical spine: The craniocervical junction is normal. Upper cervical spine is within normal limits. Marrow signal is unremarkable. Sinuses/Orbits: Bilateral mastoid effusions are present, right greater than left. The paranasal sinuses are clear. Bilateral lens replacements are noted. Globes and orbits are otherwise unremarkable. IMPRESSION: 1. Acute/subacute nonhemorrhagic infarct involving the anterior right thalamus. 2. 8 mm acute/subacute nonhemorrhagic infarct of the left centrum semi ovale. 3. Chronic encephalomalacia of the medial right occipital lobe and posteroinferior right parietal lobe. 4. Advanced diffuse white matter disease bilaterally is otherwise similar to the prior exam. This likely reflects the sequela of chronic microvascular ischemia. 5. Bilateral mastoid effusions, right greater than left. Electronically Signed   By: San Morelle M.D.   On: 01/09/2022 14:43   CT HEAD WO CONTRAST  Result Date: 01/09/2022 CLINICAL DATA:  Altered mental status EXAM: CT HEAD WITHOUT CONTRAST TECHNIQUE: Contiguous axial images were obtained from the base of the skull through the vertex without intravenous contrast. RADIATION DOSE REDUCTION: This exam was performed according to the departmental dose-optimization program which includes automated exposure control, adjustment of the mA and/or kV according to patient size and/or use of iterative reconstruction technique. COMPARISON:  Previous studies including the examination of 11/13/2021 FINDINGS: Brain: No acute intracranial findings are seen. In image 17 of series 2, there is a 3 mm high density to the left of anterior falx which has not changed significantly in comparison with previous studies suggesting possible small meningioma. There is  no adjacent edema or mass effect. There are no definite signs of recent bleeding within the cranium. Old infarct is seen in right parieto-occipital lobe. Cortical sulci are prominent. There is decreased density in periventricular and subcortical white matter. Vascular: Scattered arterial calcifications are seen. Skull: Unremarkable. Sinuses/Orbits: Unremarkable. Other: None. IMPRESSION: No acute intracranial findings are seen a noncontrast CT brain. Atrophy. Small-vessel disease. Old infarct in right parieto-occipital cortex. 3 mm small calcific density adjacent to the anterior falx which has not changed suggesting possible small meningioma or dystrophic dural calcification. Electronically Signed   By: Elmer Picker M.D.   On: 01/09/2022 12:37    Assessment and Plan:   #Recurrent stroke, possibly cardioembolic -Stroke from this summer concerning for cardioembolic stroke.  Brain MRI reviewed this admission shows areas of lacunar infarct.  She has diffuse chronic microvascular ischemia mentioned.  Suspect her stroke could be related to intracranial atherosclerosis. -We have been requested to evaluate for transesophageal echo.  Echocardiogram reviewed from July which shows normal LV function and  negative bubble study.  We will proceed with transesophageal echo for tomorrow.  N.p.o. at midnight. -She will then need to proceed with a Zio patch and then possible loop recorder for extended monitoring. -Would recommend aspirin Plavix at the discretion of neurology.  Also needs aggressive LDL reduction.  Shared Decision Making/Informed Consent The risks [esophageal damage, perforation (1:10,000 risk), bleeding, pharyngeal hematoma as well as other potential complications associated with conscious sedation including aspiration, arrhythmia, respiratory failure and death], benefits (treatment guidance and diagnostic support) and alternatives of a transesophageal echocardiogram were discussed in detail with Ms.  Monts and she is willing to proceed.   For questions or updates, please contact [place] Please consult [email or web address] for contact info under    Signed, [place] T. Audie Box, MD, [place]  01/10/2022 10:59 AM

## 2022-01-10 NOTE — H&P (View-Only) (Signed)
Cardiology Consultation:  Patient ID: SHANITHA TWINING MRN: [id number]; DOB: Apr 28, 1932  Admit date: 01/09/2022 Date of Consult: 01/10/2022  Primary Care Provider: Abner Greenspan, MD Primary Cardiologist: Peter Martinique, MD  Primary Electrophysiologist:  None   Patient Profile:  Valerie Ramirez is a 88 y.o. female with a hx of hypertension, stroke, endometrial cancer who is being seen today for the evaluation of stroke at the request of [name], MD.  History of Present Illness:  Valerie Ramirez presents with 2 weeks of slurred speech.  Apparently she suffered a stroke in July of this year.  She had plans for work-up of this and was seen by neurology last week.  They had concerns for cardioembolic source.  She seems to have not been taking her medications either.  She has no focal weakness.  She has no difficulty swallowing.  Cardiology was consulted for transesophageal echocardiogram evaluation.  I did review her echocardiogram from July which shows a negative bubble study.  She has normal LV function.  Her telemetry shows no evidence of atrial fibrillation.  An outpatient monitor was planned but she has not completed this yet.  She denies any chest pain or trouble breathing.  She is stable at the time my examination.  Again there were no contraindications to transesophageal echocardiogram.   Brain MRI from this admission shows evolving right thalamic stroke.  There is also mention of other lacunar infarcts.  She appears to have chronic microvascular ischemia noted as well.  Heart Pathway Score:       Past Medical History: Past Medical History:  Diagnosis Date   Arthritis    OA   CAD (coronary artery disease)    stent December 2018   Cancer [facility])    endometrial   GERD (gastroesophageal reflux disease)    Hearing loss    History of kidney stones    Hyperlipidemia    Hypertension    Hypothyroid    "not in years"   Shortness of breath dyspnea    04/25/17- not anymore    Past Surgical  History: Past Surgical History:  Procedure Laterality Date   ABDOMINAL HYSTERECTOMY     ANTERIOR CERVICAL DECOMP/DISCECTOMY FUSION [name] 08/18/2015   Procedure: C5-6, C6-7 Anterior Cervical Discectomy and Fusion, Allograft, Plate;  Surgeon: Marybelle Killings, MD;  Location: [place];  Service: Orthopedics;  Laterality: [name];   bone spur removed Right    shoulder   BOWEL RESECTION  07/04/2012   Procedure: SMALL BOWEL RESECTION;  Surgeon: Alvino Chapel, MD;  Location: [facility];  Service: Gynecology;;   BREAST BIOPSY     CHOLECYSTECTOMY     CORONARY STENT INTERVENTION [name] 04/06/2017   Procedure: CORONARY STENT INTERVENTION;  Surgeon: Wellington Hampshire, MD;  Location: [place] [facility];  Service: Cardiovascular;  Laterality: [name];   DILATION AND CURETTAGE OF UTERUS  1999   EYE SURGERY Bilateral    cataracts   FALSE ANEURYSM REPAIR Right 04/26/2017   Procedure: 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;  Surgeon: Conrad Pierrepont Manor, MD;  Location: [place];  Service: Vascular;  Laterality: Right;   HAND SURGERY  12/2008   after hand fx/due to fall   JOINT REPLACEMENT Bilateral 07/1998   knees   KNEE ARTHROPLASTY  05/23/1998   total right   LAPAROTOMY Bilateral 07/04/2012   Procedure: EXPLORATORY LAPAROTOMY TOTAL ABDOMINAL HYSTERECTOMY BILATERAL SALPINGO-OOPHORECTOMY with small bowel resection ;  Surgeon: Alvino Chapel, MD;  Location: [facility];  Service: Gynecology;  Laterality: Bilateral;   LEFT HEART CATH AND CORONARY ANGIOGRAPHY [name] 04/06/2017   Procedure: LEFT HEART CATH AND CORONARY ANGIOGRAPHY;  Surgeon: Jolaine Artist, MD;  Location: [place] [facility];  Service: Cardiovascular;  Laterality: [name];   polyp removal  1999   TUBAL LIGATION       Home Medications:  Prior to Admission medications   Medication Sig Start Date End Date Taking? Authorizing Provider  Cholecalciferol (VITAMIN D-3) 25 MCG (1000 UT) CAPS Take 1,000 Units by mouth daily.   Yes [provider]   clopidogrel (PLAVIX) 75 MG tablet Take 1 tablet (75 mg total) by mouth daily. 11/17/21  Yes Tower, Wynelle Fanny, MD  Cyanocobalamin (VITAMIN B-12 PO) Take 1 tablet by mouth daily.   Yes [provider]  diclofenac Sodium (VOLTAREN ARTHRITIS PAIN) 1 % GEL Apply 4 g topically 4 (four) times daily. 01/06/22  Yes Melvenia Beam, MD  donepezil (ARICEPT) 5 MG tablet Take 1 tablet (5 mg total) by mouth at bedtime. 01/06/22  Yes Melvenia Beam, MD  DULoxetine (CYMBALTA) 30 MG capsule TAKE ONE CAPSULE BY MOUTH DAILY Patient taking differently: Take 30 mg by mouth daily. 02/24/21  Yes Tower, Wynelle Fanny, MD  FEROSUL 325 (65 Fe) MG tablet TAKE ONE TABLET BY MOUTH DAILY 11/16/21  Yes Tower, Marne A, MD  lidocaine (LIDODERM) 5 % Place 1 patch onto the skin daily. Remove & Discard patch within 12 hours or as directed by MD 01/06/22  Yes Melvenia Beam, MD  Magnesium Chloride (MAG-SR PLUS CALCIUM PO) Take by mouth. Taking 1 tab once for dizziness   Yes [provider]  multivitamin-iron-minerals-folic acid (CENTRUM) chewable tablet Chew 1 tablet by mouth daily. 02/10/21  Yes Carmin Muskrat, MD  rosuvastatin (CRESTOR) 20 MG tablet Take 1 tablet (20 mg total) by mouth daily. 11/17/21  Yes Tower, Wynelle Fanny, MD  Colchicine 0.6 MG CAPS Take 1 capsule by mouth in the morning and at bedtime. Patient taking differently: Take 0.6 mg by mouth in the morning and at bedtime. 08/28/20   Tower, Wynelle Fanny, MD  nitroGLYCERIN (NITROSTAT) 0.4 MG SL tablet Place 1 tablet (0.4 mg total) under the tongue every 5 (five) minutes x 3 doses as needed for chest pain. Patient taking differently: Place 0.4 mg under the tongue every 5 (five) minutes as needed for chest pain. 04/09/17   [name], [name], PA-C    Inpatient Medications: Scheduled Meds:   stroke: early stages of recovery book   Does not apply Once   aspirin EC  81 mg Oral Daily   clopidogrel  75 mg Oral Daily   vitamin B-12  1,000 mcg Oral Daily   donepezil  5 mg Oral  QHS   DULoxetine  30 mg Oral Daily   enoxaparin (LOVENOX) injection  30 mg Subcutaneous Q24H   ferrous sulfate  325 mg Oral Daily   Continuous Infusions:  PRN Meds: acetaminophen **OR** acetaminophen (TYLENOL) oral liquid 160 mg/5 mL **OR** acetaminophen, nitroGLYCERIN, senna-docusate  Allergies:    Allergies  Allergen Reactions   Allopurinol Nausea Only   Lipitor [Atorvastatin Calcium] Itching and Other (See Comments)    Leg aches and aching all over   Morphine And Related Other (See Comments)    Makes the patient "loopy,"    Simvastatin Other (See Comments)    MUSCLE PAIN   Tape Itching, Rash and Other (See Comments)    "Cannot use paper tape" --- also causes blisters. Use plastic tape  Social History:   Social History   Socioeconomic History   Marital status: Widowed    Spouse name: Not on file   Number of children: 2   Years of education: Not on file   Highest education level: High school graduate  Occupational History   Not on file  Tobacco Use   Smoking status: Never   Smokeless tobacco: Never  Vaping Use   Vaping Use: Never used  Substance and Sexual Activity   Alcohol use: Never    Alcohol/week: 0.0 standard drinks of alcohol   Drug use: Never   Sexual activity: Never    Birth control/protection: Post-menopausal  Other Topics Concern   Not on file  Social History Narrative   Not on file   Social Determinants of Health   Financial Resource Strain: Low Risk  (07/16/2021)   Overall Financial Resource Strain (CARDIA)    Difficulty of Paying Living Expenses: Not hard at all  Food Insecurity: No Food Insecurity (01/09/2022)   Hunger Vital Sign    Worried About Running Out of Food in the Last Year: Never true    [place] in the Last Year: Never true  Transportation Needs: No Transportation Needs (01/09/2022)   PRAPARE - Hydrologist (Medical): No    Lack of Transportation (Non-Medical): No  Physical Activity:  Insufficiently Active (07/16/2021)   Exercise Vital Sign    Days of Exercise per Week: 4 days    Minutes of Exercise per Session: 30 min  Stress: No Stress Concern Present (07/16/2021)   [place]    Feeling of Stress : Not at all  Social Connections: Moderately Isolated (07/16/2021)   Social Connection and Isolation Panel [NHANES]    Frequency of Communication with Friends and Family: More than three times a week    Frequency of Social Gatherings with Friends and Family: Twice a week    Attends Religious Services: More than 4 times per year    Active Member of [employer] or Organizations: No    Attends Archivist Meetings: Never    Marital Status: Widowed  Intimate Partner Violence: Not At Risk (01/09/2022)   Humiliation, Afraid, Rape, and Kick questionnaire    Fear of Current or Ex-Partner: No    Emotionally Abused: No    Physically Abused: No    Sexually Abused: No     Family History:    Family History  Problem Relation Age of Onset   Heart disease Father    Heart attack Father    Leukemia Brother    Lung cancer Brother    Leukemia Brother    Stomach cancer Brother    Heart disease Sister        CABG   Stroke Sister    Cancer Sister    Bladder Cancer Sister    Heart disease Brother        CABG   Stroke Sister        "massive brain bleed"     ROS:  All other ROS reviewed and negative. Pertinent positives noted in the HPI.     Physical Exam/Data:   Vitals:   01/10/22 0005 01/10/22 0421 01/10/22 0739 01/10/22 0807  BP: (!) 186/96 (!) 171/102 (!) 165/96 (!) 183/107  Pulse: 96 (!) 101 (!) 102 (!) 102  Resp: '18 16 17 16  '$ Temp: 98.4 F (36.9 C) 99 F (37.2 C) 98.6 F (37 C) 98.2 F (36.8  C)  TempSrc:   Oral   SpO2: 96% 94% 94% 96%  Weight:      Height:       No intake or output data in the 24 hours ending 01/10/22 1059     01/09/2022   10:59 AM 01/06/2022   10:21 AM 11/17/2021   12:00 PM   Last 3 Weights  Weight (lbs) 212 lb 8.4 oz 186 lb 3.2 oz 184 lb 6.4 oz  Weight (kg) 96.4 kg 84.46 kg 83.643 kg    Body mass index is 38.87 kg/m.  General: Well nourished, well developed, in no acute distress Head: Atraumatic, normal size  Eyes: PEERLA, EOMI  Neck: Supple, no JVD Endocrine: No thryomegaly Cardiac: Normal S1, S2; RRR; no murmurs, rubs, or gallops Lungs: Clear to auscultation bilaterally, no wheezing, rhonchi or rales  Abd: Soft, nontender, no hepatomegaly  Ext: No edema, pulses 2+ Musculoskeletal: No deformities, BUE and BLE strength normal and equal Skin: Warm and dry, no rashes   Neuro: Alert and oriented to person, place, time, and situation, CNII-XII grossly intact, no focal deficits  Psych: Normal mood and affect   EKG:  The EKG was personally reviewed and demonstrates: Sinus arrhythmia heart rate 73, no acute ischemic changes Telemetry:  Telemetry was personally reviewed and demonstrates: Sinus rhythm  Relevant CV Studies: TTE 11/14/2021   1. Left ventricular ejection fraction, by estimation, is 60 to 65%. The  left ventricle has normal function. The left ventricle has no regional  wall motion abnormalities. Left ventricular diastolic parameters are  consistent with Grade I diastolic  dysfunction (impaired relaxation).   2. Right ventricular systolic function is normal. The right ventricular  size is normal.   3. Left atrial size was mildly dilated.   4. The mitral valve is normal in structure. Trivial mitral valve  regurgitation. No evidence of mitral stenosis.   5. The aortic valve is tricuspid. There is mild calcification of the  aortic valve. Aortic valve regurgitation is not visualized. Aortic valve  sclerosis is present, with no evidence of aortic valve stenosis.   6. The inferior vena cava is normal in size with greater than 50%  respiratory variability, suggesting right atrial pressure of 3 mmHg.   7. Agitated saline contrast bubble study was  negative, with no evidence  of any interatrial shunt.   Laboratory Data: High Sensitivity Troponin:  No results for input(s): "TROPONINIHS" in the last 720 hours.   Cardiac EnzymesNo results for input(s): "TROPONINI" in the last 168 hours. No results for input(s): "TROPIPOC" in the last 168 hours.  Chemistry Recent Labs  Lab 01/09/22 1104 01/09/22 1802  NA 139  --   K 4.6  --   CL 105  --   CO2 25  --   GLUCOSE 152*  --   BUN 29*  --   CREATININE 1.57* 1.51*  CALCIUM 9.4  --   GFRNONAA 32* 33*  ANIONGAP 9  --     Recent Labs  Lab 01/09/22 1104  PROT 7.5  ALBUMIN 3.9  AST 20  ALT 16  ALKPHOS 109  BILITOT 0.9   Hematology Recent Labs  Lab 01/09/22 1104 01/09/22 1802  WBC 9.2 8.3  RBC 4.04 4.11  HGB 12.2 12.5  HCT 38.5 38.8  MCV 95.3 94.4  MCH 30.2 30.4  MCHC 31.7 32.2  RDW 13.4 13.5  PLT 168 169   BNPNo results for input(s): "BNP", "PROBNP" in the last 168 hours.  DDimer No results for input(s): "  DDIMER" in the last 168 hours.  Radiology/Studies:  MR BRAIN WO CONTRAST  Result Date: 01/09/2022 CLINICAL DATA:  TiA. Stroke follow-up. Slurred speech 2 weeks. EXAM: MRI HEAD WITHOUT CONTRAST TECHNIQUE: Multiplanar, multiecho pulse sequences of the brain and surrounding structures were obtained without intravenous contrast. COMPARISON:  CT head without contrast 01/09/2022. MR head without contrast 11/13/2021 FINDINGS: Brain: Acute/subacute nonhemorrhagic infarct is present in the anterior right thalamus. A second acute/subacute nonhemorrhagic infarct is present in the left centrum semi ovale measuring 8 mm. T2 and FLAIR hyperintensities are associated with both of the acute/subacute infarcts. Chronic encephalomalacia of the medial right occipital lobe and posteroinferior right parietal lobe is stable. Advanced diffuse white matter disease is otherwise similar to the prior exam. No acute hemorrhage or mass lesion is present. White matter changes extend into the brainstem.  Cerebellum is unremarkable. The internal auditory canals are within normal limits. No significant extraaxial fluid collection is present. Vascular: Flow is present in the major intracranial arteries. Skull and upper cervical spine: The craniocervical junction is normal. Upper cervical spine is within normal limits. Marrow signal is unremarkable. Sinuses/Orbits: Bilateral mastoid effusions are present, right greater than left. The paranasal sinuses are clear. Bilateral lens replacements are noted. Globes and orbits are otherwise unremarkable. IMPRESSION: 1. Acute/subacute nonhemorrhagic infarct involving the anterior right thalamus. 2. 8 mm acute/subacute nonhemorrhagic infarct of the left centrum semi ovale. 3. Chronic encephalomalacia of the medial right occipital lobe and posteroinferior right parietal lobe. 4. Advanced diffuse white matter disease bilaterally is otherwise similar to the prior exam. This likely reflects the sequela of chronic microvascular ischemia. 5. Bilateral mastoid effusions, right greater than left. Electronically Signed   By: San Morelle M.D.   On: 01/09/2022 14:43   CT HEAD WO CONTRAST  Result Date: 01/09/2022 CLINICAL DATA:  Altered mental status EXAM: CT HEAD WITHOUT CONTRAST TECHNIQUE: Contiguous axial images were obtained from the base of the skull through the vertex without intravenous contrast. RADIATION DOSE REDUCTION: This exam was performed according to the departmental dose-optimization program which includes automated exposure control, adjustment of the mA and/or kV according to patient size and/or use of iterative reconstruction technique. COMPARISON:  Previous studies including the examination of 11/13/2021 FINDINGS: Brain: No acute intracranial findings are seen. In image 17 of series 2, there is a 3 mm high density to the left of anterior falx which has not changed significantly in comparison with previous studies suggesting possible small meningioma. There is  no adjacent edema or mass effect. There are no definite signs of recent bleeding within the cranium. Old infarct is seen in right parieto-occipital lobe. Cortical sulci are prominent. There is decreased density in periventricular and subcortical white matter. Vascular: Scattered arterial calcifications are seen. Skull: Unremarkable. Sinuses/Orbits: Unremarkable. Other: None. IMPRESSION: No acute intracranial findings are seen a noncontrast CT brain. Atrophy. Small-vessel disease. Old infarct in right parieto-occipital cortex. 3 mm small calcific density adjacent to the anterior falx which has not changed suggesting possible small meningioma or dystrophic dural calcification. Electronically Signed   By: Elmer Picker M.D.   On: 01/09/2022 12:37    Assessment and Plan:   #Recurrent stroke, possibly cardioembolic -Stroke from this summer concerning for cardioembolic stroke.  Brain MRI reviewed this admission shows areas of lacunar infarct.  She has diffuse chronic microvascular ischemia mentioned.  Suspect her stroke could be related to intracranial atherosclerosis. -We have been requested to evaluate for transesophageal echo.  Echocardiogram reviewed from July which shows normal LV function and  negative bubble study.  We will proceed with transesophageal echo for tomorrow.  N.p.o. at midnight. -She will then need to proceed with a Zio patch and then possible loop recorder for extended monitoring. -Would recommend aspirin Plavix at the discretion of neurology.  Also needs aggressive LDL reduction.  Shared Decision Making/Informed Consent The risks [esophageal damage, perforation (1:10,000 risk), bleeding, pharyngeal hematoma as well as other potential complications associated with conscious sedation including aspiration, arrhythmia, respiratory failure and death], benefits (treatment guidance and diagnostic support) and alternatives of a transesophageal echocardiogram were discussed in detail with Ms.  Kern and she is willing to proceed.   For questions or updates, please contact [facility] Please consult [email or web address] for contact info under    Signed, [place] T. Audie Box, MD, [place]  01/10/2022 10:59 AM

## 2022-01-10 NOTE — Plan of Care (Signed)
  Problem: Education: Goal: Knowledge of General Education information will improve Description: Including pain rating scale, medication(s)/side effects and non-pharmacologic comfort measures Outcome: Progressing   Problem: Health Behavior/Discharge Planning: Goal: Ability to manage health-related needs will improve Outcome: Progressing   Problem: Clinical Measurements: Goal: Ability to maintain clinical measurements within normal limits will improve Outcome: Progressing Goal: Will remain free from infection Outcome: Progressing Goal: Diagnostic test results will improve Outcome: Progressing Goal: Respiratory complications will improve Outcome: Progressing Goal: Cardiovascular complication will be avoided Outcome: Progressing   Problem: Activity: Goal: Risk for activity intolerance will decrease Outcome: Progressing   Problem: Nutrition: Goal: Adequate nutrition will be maintained Outcome: Progressing   Problem: Coping: Goal: Level of anxiety will decrease Outcome: Progressing   Problem: Elimination: Goal: Will not experience complications related to bowel motility Outcome: Progressing Goal: Will not experience complications related to urinary retention Outcome: Progressing   Problem: Pain Managment: Goal: General experience of comfort will improve Outcome: Progressing   Problem: Safety: Goal: Ability to remain free from injury will improve Outcome: Progressing   Problem: Skin Integrity: Goal: Risk for impaired skin integrity will decrease Outcome: Progressing   Problem: Education: Goal: Knowledge of disease or condition will improve Outcome: Progressing Goal: Knowledge of secondary prevention will improve (SELECT ALL) Outcome: Progressing Goal: Knowledge of patient specific risk factors will improve (INDIVIDUALIZE FOR PATIENT) Outcome: Progressing Goal: Individualized Educational Video(s) Outcome: Progressing   Problem: Coping: Goal: Will verbalize  positive feelings about self Outcome: Progressing Goal: Will identify appropriate support needs Outcome: Progressing   Problem: Health Behavior/Discharge Planning: Goal: Ability to manage health-related needs will improve Outcome: Progressing   Problem: Self-Care: Goal: Ability to participate in self-care as condition permits will improve Outcome: Progressing Goal: Verbalization of feelings and concerns over difficulty with self-care will improve Outcome: Progressing Goal: Ability to communicate needs accurately will improve Outcome: Progressing   Problem: Nutrition: Goal: Risk of aspiration will decrease Outcome: Progressing Goal: Dietary intake will improve Outcome: Progressing   Problem: Ischemic Stroke/TIA Tissue Perfusion: Goal: Complications of ischemic stroke/TIA will be minimized Outcome: Progressing

## 2022-01-10 NOTE — Progress Notes (Addendum)
  Progress Note   Patient: Valerie Ramirez PYK:[id number] DOB: [birth year] DOA: 01/09/2022     1 DOS: the patient was seen and examined on 01/10/2022   Brief hospital course: Valerie Ramirez is a 88 y.o. female with medical history significant of coronary artery disease, endometrial cancer, essential hypertension, dyslipidemia, hypothyroidism, and osteoarthritis who came to the hospital with complaints of 2 weeks history of slurred speech. MRI showed: Acute/subacute nonhemorrhagic infarct involving the anterior right thalamus. 8 mm acute/subacute nonhemorrhagic infarct of the left centrum semi ovale. Chronic encephalomalacia of the medial right occipital lobe and posteroinferior right parietal lobe. Advanced diffuse white matter disease bilaterally. Patient treated with aspirin Plavix, placed on telemetry.  Assessment and Plan: Acute embolic stroke. Vascular dementia. Dyslipidemia. I reviewed all the recorded telemetry strips, no atrial fibrillation. Continue aspirin and Plavix.  Patient cannot tolerate statin. Patient is scheduled to have TEE tomorrow.  Probably discharge home tomorrow after TEE.  Patient has a ZIO recorder sent to her home by her neurologist. Patient has been seen by speech therapy, does not seem to have any additional aspiration risk.  But the patient scored a 15 out of 30 on SLUMS.  Probably due to vascular dementia. Patient be followed with neurology after discharge from hospital.  Coronary artery disease. Essential hypertension. Continue aspirin Plavix, monitor blood pressure  Chronic kidney disease stage IIIb. Renal function stable.     Subjective:  Patient appears to be doing well, speech has recovered. No weakness.  Physical Exam: Vitals:   01/10/22 0005 01/10/22 0421 01/10/22 0739 01/10/22 0807  BP: (!) 186/96 (!) 171/102 (!) 165/96 (!) 183/107  Pulse: 96 (!) 101 (!) 102 (!) 102  Resp: '18 16 17 16  '$ Temp: 98.4 F (36.9 C) 99 F (37.2 C) 98.6 F (37 C)  98.2 F (36.8 C)  TempSrc:   Oral   SpO2: 96% 94% 94% 96%  Weight:      Height:       General exam: Appears calm and comfortable  Respiratory system: Clear to auscultation. Respiratory effort normal. Cardiovascular system: S1 & S2 heard, RRR. No JVD, murmurs, rubs, gallops or clicks. No pedal edema. Gastrointestinal system: Abdomen is nondistended, soft and nontender. No organomegaly or masses felt. Normal bowel sounds heard. Central nervous system: Alert and oriented x2. No focal neurological deficits. Extremities: Symmetric 5 x 5 power. Skin: No rashes, lesions or ulcers Psychiatry: Judgement and insight appear normal. Mood & affect appropriate.   Data Reviewed:  Lab results reviewed.  Family Communication: Son updated at bedside 1350  Disposition: Status is: Inpatient Remains inpatient appropriate because: Severity of disease, pending inpatient procedure.  Planned Discharge Destination: Home with Home Health    Time spent: 35 minutes  Author: [name], MD 01/10/2022 1:23 PM  For on call review [email or web address].

## 2022-01-10 NOTE — Progress Notes (Signed)
Neurology Progress Note   Subjective: -Notes she had spilled a cup of water on the floor -Denies any acute complaints, reports she has atrial fibrillation (this is not a confirmed diagnosis) -Significant cognitive impairments on SLP evaluation, they are recommending 24/7 supervision  Exam: Vitals:   01/10/22 0739 01/10/22 0807  BP: (!) 165/96 (!) 183/107  Pulse: (!) 102 (!) 102  Resp: 17 16  Temp: 98.6 F (37 C) 98.2 F (36.8 C)  SpO2: 94% 96%   Gen: In bed, comfortable  Resp: non-labored breathing, no grossly audible wheezing Cardiac: Perfusing extremities well  Abd: soft, nt  Neuro: MS: Alert, awake, follows simple commands, able to name and repeat, correctly states the month and her age but with some hesitation today compared to yesterday.  Initially reports that time to me "10 till 8" when reading her watch, corrects herself to "10 till 53" (time is 8:55 on the watch) CN: Stable left hemianopia, EOMI with saccadic pursuits, face symmetric, tongue midline Motor: Mild pronation of the left upper extremity without drift.  No drift of the bilateral lower extremities Sensory: Reports intact throughout Coordination: Finger-to-nose intact bilaterally  NIH 3 today for left facial droop, chronic left hemianopia   Pertinent Labs:  Basic Metabolic Panel: Recent Labs  Lab 01/09/22 1104 01/09/22 1802  NA 139  --   K 4.6  --   CL 105  --   CO2 25  --   GLUCOSE 152*  --   BUN 29*  --   CREATININE 1.57* 1.51*  CALCIUM 9.4  --     CBC: Recent Labs  Lab 01/09/22 1104 01/09/22 1802  WBC 9.2 8.3  NEUTROABS 6.2  --   HGB 12.2 12.5  HCT 38.5 38.8  MCV 95.3 94.4  PLT 168 169    Coagulation Studies: Recent Labs    01/09/22 1104  LABPROT 14.0  INR 1.1    Lab Results  Component Value Date   HGBA1C 6.2 (H) 11/14/2021   Lab Results  Component Value Date   CHOL 130 01/10/2022   HDL 52 01/10/2022   LDLCALC 53 01/10/2022   LDLDIRECT 136.0 10/31/2015   TRIG 125  01/10/2022   CHOLHDL 2.5 01/10/2022     Impression: 88 year old presenting with recurrent multifocal embolic appearing strokes concerning for cardioembolic source.  Given her age and her left atrial enlargement I do think she is at high risk for underlying occult atrial fibrillation especially given her pattern of strokes, not all of which are lacunar.  Note if she has a sudden change in neurological status she would not be a candidate for TNK but she would potentially be a candidate for intra-arterial thrombectomy (she does have some cognitive impairment with recent worsening noted on SLP evaluation today, hospital delirium versus secondary to advancing vascular dementia); borderline MRS 2-3  Recommendations: -Appreciate cardiology plan for TEE -Continue dual antiplatelet at this time for 21 day course if no indication for [facility] determined.  If there is an indication for anticoagulation found and anticoagulation initiated, would recommend antiplatelet monotherapy for CAD, choice of aspirin or Plavix per primary team cardiology - Frequent neurochecks, activate code stroke for consideration of thrombectomy if patient has sudden significant neurological decline (NIH greater than 6) -LDL meeting goal, continue rosuvastatin 20 mg -Neurology will follow-up TEE results but otherwise will be available on an as-needed basis going forward  [place] [phone]  [facility] coverage for [facility] is from 8 AM to 4 AM in-house and  4 PM to 8 PM by telephone/video. 8 PM to 8 AM emergent questions or overnight urgent questions should be addressed to Teleneurology On-call or [facility] neurohospitalist; contact information can be found on AMION

## 2022-01-10 NOTE — Hospital Course (Signed)
Valerie Ramirez is a 88 y.o. female with medical history significant of coronary artery disease, endometrial cancer, essential hypertension, dyslipidemia, hypothyroidism, and osteoarthritis who came to the hospital with complaints of 2 weeks history of slurred speech. MRI showed: Acute/subacute nonhemorrhagic infarct involving the anterior right thalamus. 8 mm acute/subacute nonhemorrhagic infarct of the left centrum semi ovale. Chronic encephalomalacia of the medial right occipital lobe and posteroinferior right parietal lobe. Advanced diffuse white matter disease bilaterally. Patient treated with aspirin Plavix, placed on telemetry.

## 2022-01-10 NOTE — Evaluation (Signed)
Speech Language Pathology Evaluation Patient Details Name: Valerie Ramirez MRN: [id number] DOB: 07-Apr-1933 Today's Date: 01/10/2022 Time: 1000-1042 SLP Time Calculation (min) (ACUTE ONLY): 42 min  Problem List:  Patient Active Problem List   Diagnosis Date Noted   Acute ischemic stroke ([place]) 01/09/2022   Dyslipidemia 88/82/8003   Acute embolic stroke ([place]) 49/17/9150   Mild cognitive impairment 01/06/2022   Acute CVA (cerebrovascular accident) ([place]) 11/14/2021   Fall 06/24/2021   Syncope and collapse 06/24/2021   Acute pain of both knees 06/24/2021   Light headedness 06/24/2021   Bilateral impacted cerumen 06/24/2021   Heme positive stool 02/24/2021   UTI (urinary tract infection) 03/25/2020   History of CVA (cerebrovascular accident) 02/14/2020   Headache 02/14/2020   Diarrhea 12/26/2017   Dizziness 12/26/2017   Depression 10/26/2017   H/O subdural hemorrhage 09/18/2017   CAD S/P percutaneous coronary angioplasty 09/18/2017   Closed fracture of nasal bones    Epistaxis    Elevated transaminase level 08/03/2017   Radial artery injury, left, sequela 06/15/2017   H/O: CVA (cerebrovascular accident) 05/26/2017   Sick sinus syndrome ([place]) 05/25/2017   Exertional chest pain 04/05/2017   Chronic kidney disease, stage 3b ([place]) 04/05/2017   Anemia 04/05/2017   Thoracic back pain 09/15/2016   History of radius fracture 06/01/2016   AKI (acute kidney injury) ([place]) 04/25/2016   Leukocytosis 04/25/2016   Status post cervical spinal fusion 08/18/2015   Acute pain of right shoulder 05/16/2015   Osteoarthritis, hip, bilateral 08/12/2014   Hearing loss in right ear 06/12/2014   Colon cancer screening 08/10/2013   Seborrheic keratoses, inflamed 08/10/2013   History of fall 04/10/2013   Encounter for Medicare annual wellness exam 08/08/2012   Gout 07/14/2012   History of endometrial cancer 05/25/2012   Degenerative joint disease of cervical spine 05/03/2011   Other screening  mammogram 03/31/2011   Prediabetes 09/26/2007   Obesity (BMI 30-39.9) 07/12/2007   Essential hypertension 07/11/2007   History of cardiomyopathy 07/11/2007   Osteoarthritis 07/11/2007   MIXED INCONTINENCE URGE AND STRESS 07/11/2007   Hypothyroidism 10/05/2006   HYPERCHOLESTEROLEMIA, PURE 10/05/2006   Past Medical History:  Past Medical History:  Diagnosis Date   Arthritis    OA   CAD (coronary artery disease)    stent December 2018   Cancer [facility])    endometrial   GERD (gastroesophageal reflux disease)    Hearing loss    History of kidney stones    Hyperlipidemia    Hypertension    Hypothyroid    "not in years"   Shortness of breath dyspnea    04/25/17- not anymore   Past Surgical History:  Past Surgical History:  Procedure Laterality Date   ABDOMINAL HYSTERECTOMY     ANTERIOR CERVICAL DECOMP/DISCECTOMY FUSION [name] 08/18/2015   Procedure: C5-6, C6-7 Anterior Cervical Discectomy and Fusion, Allograft, Plate;  Surgeon: Marybelle Killings, MD;  Location: [place];  Service: Orthopedics;  Laterality: [name];   bone spur removed Right    shoulder   BOWEL RESECTION  07/04/2012   Procedure: SMALL BOWEL RESECTION;  Surgeon: Alvino Chapel, MD;  Location: [facility];  Service: Gynecology;;   BREAST BIOPSY     CHOLECYSTECTOMY     CORONARY STENT INTERVENTION [name] 04/06/2017   Procedure: CORONARY STENT INTERVENTION;  Surgeon: Wellington Hampshire, MD;  Location: [place] [facility];  Service: Cardiovascular;  Laterality: [name];   DILATION AND CURETTAGE OF UTERUS  1999   EYE SURGERY Bilateral    cataracts  FALSE ANEURYSM REPAIR Right 04/26/2017   Procedure: 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;  Surgeon: Conrad Kingman, MD;  Location: [place];  Service: Vascular;  Laterality: Right;   HAND SURGERY  12/2008   after hand fx/due to fall   JOINT REPLACEMENT Bilateral 07/1998   knees   KNEE ARTHROPLASTY  05/23/1998   total right   LAPAROTOMY Bilateral 07/04/2012   Procedure: EXPLORATORY  LAPAROTOMY TOTAL ABDOMINAL HYSTERECTOMY BILATERAL SALPINGO-OOPHORECTOMY with small bowel resection ;  Surgeon: Alvino Chapel, MD;  Location: [facility];  Service: Gynecology;  Laterality: Bilateral;   LEFT HEART CATH AND CORONARY ANGIOGRAPHY [name] 04/06/2017   Procedure: LEFT HEART CATH AND CORONARY ANGIOGRAPHY;  Surgeon: Jolaine Artist, MD;  Location: [place] [facility];  Service: Cardiovascular;  Laterality: [name];   polyp removal  1999   TUBAL LIGATION     HPI:  Valerie Ramirez is a 88 y.o. female with a past medical history of recent admission for work-up of multifocal embolic stroke (3/61/4431, no etiology determined, but no extended cardiac monitoring to date), hypertension, hyperlipidemia, prior left PCA stroke, coronary artery disease s/p stents, remote endometrial cancer, kidney stones. Pt [facility] ED on 01/09/2022 for complaint of altered mental status and intermittent slurred speech.     MRI on 11/13/2021 revealed Patchy small volume acute nonhemorrhagic right PCA distribution infarct as above. This is superimposed on an underlying chronic  right PCA territory infarct. 2. Additional scattered subcentimeter acute to early subacute nonhemorrhagic ischemic infarcts involving the bilateral cerebral  hemispheres as above. 3. Underlying age-related cerebral atrophy with advanced chronic microvascular ischemic disease, with a few scattered remote ischemic  infarcts as above.  Most recent MRI on 01/09/2022 Acute/subacute nonhemorrhagic infarct involving the anterior right thalamus.  2. 8 mm acute/subacute nonhemorrhagic infarct of the left centrum  semi ovale. 3. Chronic encephalomalacia of the medial right occipital lobe and  posteroinferior right parietal lobe. 4. Advanced diffuse white matter disease bilaterally is otherwise similar to the prior exam. This likely reflects the sequela of  chronic microvascular ischemia. 5. Bilateral mastoid effusions, right greater than left.    Assessment / Plan / Recommendation Clinical Impression  Pt presents with worsening cognitive impairment when compared to most recent appt with her neurologist on 01/06/2022. At that visit pt obtained a score of 22 out of 30 on the MMSE indicating a mild cognitive impairment. During today's assessment she presents with a moderate cognitive impairment c/b deficits in selective attention, memory, semi-complex problem solving, awareness of deficits and decreased safety awareness. These deficits are further supported by pt's score of 15 out of 30 on the SLUMS which indicates a moderate impairment. Pt's son and additional family members were present throughout the assessment. Extensive education provided on worsened cognitive impairment following most recent CVA's. Information provided on recommendation for 24 hour supervision with direct assistance recommended for medication and money management.  While pt was disappointed in this recommendation, her son stated "we can make that happen."   Skilled ST is not indicated at this time. Would recommend HHST to assist in functional problem solving within pt's home environment.    SLP Assessment  SLP Recommendation/Assessment: All further Speech Lanaguage Pathology  needs can be addressed in the next venue of care SLP Visit Diagnosis: Cognitive communication deficit (R41.841)    Recommendations for follow up therapy are one component of a multi-disciplinary discharge planning process, led by the attending physician.  Recommendations may be updated based on patient status,  additional functional criteria and insurance authorization.    Follow Up Recommendations  Home health SLP    Assistance Recommended at Discharge  Frequent or constant Supervision/Assistance - RECOMMEND 24 HOUR SUPERVISION   Functional Status Assessment Patient has had a recent decline in their functional status and/or demonstrates limited ability to make significant improvements in function  in a reasonable and predictable amount of time        SLP Evaluation Cognition  Overall Cognitive Status: Impaired/Different from baseline Arousal/Alertness: Awake/alert Orientation Level: Disoriented to situation;Oriented to person;Oriented to place;Oriented to time Year: 2023 Day of Week: Correct Attention: Selective Selective Attention: Impaired Selective Attention Impairment: Verbal complex;Functional complex Memory: Impaired Memory Impairment: Retrieval deficit;Decreased short term memory;Decreased recall of new information Decreased Short Term Memory: Verbal complex;Functional complex Awareness: Impaired Awareness Impairment: Intellectual impairment;Emergent impairment;Anticipatory impairment Problem Solving: Impaired Problem Solving Impairment: Verbal complex;Functional complex Safety/Judgment: Impaired       Comprehension  Auditory Comprehension Overall Auditory Comprehension: Appears within functional limits for tasks assessed Reading Comprehension Reading Status: Not tested    Expression Expression Primary Mode of Expression: Verbal Verbal Expression Overall Verbal Expression: Appears within functional limits for tasks assessed Written Expression Dominant Hand: Right Written Expression: Within Functional Limits   Oral / Motor  Oral Motor/Sensory Function Overall Oral Motor/Sensory Function: Within functional limits Motor Speech Overall Motor Speech: Appears within functional limits for tasks assessed           Jaquitta Dupriest B. Rutherford Nail, M.S., CCC-SLP, Mining engineer Certified Brain Injury [place]  [place] Office [phone] Ascom [phone] Fax [phone]

## 2022-01-10 NOTE — Progress Notes (Signed)
Patient is alert and oriented x3. Confused to situation due to the fact that she had on a soggy wet diaper and refused to allow staff to remove it. Family was in room to witness. Later on, patient allowed [place] to remove diaper but refused a bath. Educated patient on hygiene but patient continued to refuse stating that she was tired and would do it later. Denied pain. Has a small open area on the left upper arm that was bleeding. She stated that a cat had scratched her. Day nurse placed foam on patient's arm. Bp has been elevated but patient is stroke patient-day provider is aware of elevated bps. Day nurse informed that there was no concern unless 349 or above systolic. Denied additional needs.

## 2022-01-11 ENCOUNTER — Encounter: Payer: Self-pay | Admitting: Internal Medicine

## 2022-01-11 DIAGNOSIS — I639 Cerebral infarction, unspecified: Secondary | ICD-10-CM | POA: Diagnosis not present

## 2022-01-11 DIAGNOSIS — N1832 Chronic kidney disease, stage 3b: Secondary | ICD-10-CM | POA: Diagnosis not present

## 2022-01-11 DIAGNOSIS — I10 Essential (primary) hypertension: Secondary | ICD-10-CM | POA: Diagnosis not present

## 2022-01-11 MED ORDER — SODIUM CHLORIDE 0.9 % IV SOLN: INTRAVENOUS | Status: DC

## 2022-01-11 MED ORDER — INFLUENZA VAC A&B SA ADJ QUAD 0.5 ML IM PRSY
0.5000 mL | PREFILLED_SYRINGE | INTRAMUSCULAR | Status: AC
  Administered 2022-01-12: 0.5 mL via INTRAMUSCULAR
  Filled 2022-01-11: qty 0.5

## 2022-01-11 NOTE — Progress Notes (Signed)
  Progress Note   Patient: Valerie Ramirez YWV:[id number] DOB: 04-06-1933 DOA: 01/09/2022     2 DOS: the patient was seen and examined on 01/11/2022   Brief hospital course: DENIZ ESKRIDGE is a 88 y.o. female with medical history significant of coronary artery disease, endometrial cancer, essential hypertension, dyslipidemia, hypothyroidism, and osteoarthritis who came to the hospital with complaints of 2 weeks history of slurred speech. MRI showed: Acute/subacute nonhemorrhagic infarct involving the anterior right thalamus. 8 mm acute/subacute nonhemorrhagic infarct of the left centrum semi ovale. Chronic encephalomalacia of the medial right occipital lobe and posteroinferior right parietal lobe. Advanced diffuse white matter disease bilaterally. Patient treated with aspirin Plavix, placed on telemetry.  Assessment and Plan: Acute embolic stroke. Vascular dementia. Dyslipidemia. Patient has been seen by speech therapy, does not seem to have any additional aspiration risk.  But the patient scored a 15 out of 30 on SLUMS.  Probably due to vascular dementia. Fasting lipid panel showed LDL 53, at goal. Continue aspirin and Plavix. Discussed with cardiology, TEE scheduled for tomorrow.  Telemetry still has no evidence of an arrhythmia.   Coronary artery disease. Essential hypertension. Blood pressure has dropped down.  Continue aspirin Plavix.   Chronic kidney disease stage IIIb. Renal function stable.       Subjective:  Patient has not complained, no longer has any slurred speech.  Physical Exam: Vitals:   01/11/22 0400 01/11/22 0728 01/11/22 1111 01/11/22 1113  BP: (!) 155/65 (!) 174/92  (!) 144/58  Pulse: 86 100 99 83  Resp: '18 20 18   '$ Temp: 97.9 F (36.6 C) 98 F (36.7 C) 97.8 F (36.6 C)   TempSrc: Axillary     SpO2: 97% 95% 94% 96%  Weight: 83.3 kg     Height:       General exam: Appears calm and comfortable  Respiratory system: Clear to auscultation. Respiratory  effort normal. Cardiovascular system: S1 & S2 heard, RRR. No JVD, murmurs, rubs, gallops or clicks. No pedal edema. Gastrointestinal system: Abdomen is nondistended, soft and nontender. No organomegaly or masses felt. Normal bowel sounds heard. Central nervous system: Alert and oriented. No focal neurological deficits. Extremities: Symmetric 5 x 5 power. Skin: No rashes, lesions or ulcers Psychiatry: Judgement and insight appear normal. Mood & affect appropriate.   Data Reviewed:  Reviewed lipid panel.  Family Communication:   Disposition: Status is: Inpatient Remains inpatient appropriate because: Pending inpatient procedure.  Planned Discharge Destination: Home    Time spent: 26 minutes  Author: [name], MD 01/11/2022 1:41 PM  For on call review [email or web address].

## 2022-01-11 NOTE — Progress Notes (Signed)
NPO since 0000. Consent signed and in chart. All jewelry removed and accounted for in a bag with patient sticker on it. Dentures out and in a denture cup with a patient sticker. IV patent and saline locked.

## 2022-01-11 NOTE — Progress Notes (Signed)
Rounding Note    Patient Name: Valerie Ramirez Date of Encounter: 01/11/2022  [place] Cardiologist: Peter Martinique, MD   Subjective   Patient seen on AM rounds. Denies any chest pain or shortness of breath. Scheduled for TEE tomorrow morning.   Inpatient Medications    Scheduled Meds:  aspirin EC  81 mg Oral Daily   clopidogrel  75 mg Oral Daily   vitamin B-12  1,000 mcg Oral Daily   donepezil  5 mg Oral QHS   DULoxetine  30 mg Oral Daily   enoxaparin (LOVENOX) injection  30 mg Subcutaneous Q24H   ferrous sulfate  325 mg Oral Daily   [START ON 01/12/2022] influenza vaccine adjuvanted  0.5 mL Intramuscular Tomorrow-1000   Continuous Infusions:  PRN Meds: acetaminophen **OR** acetaminophen (TYLENOL) oral liquid 160 mg/5 mL **OR** acetaminophen, nitroGLYCERIN, senna-docusate   Vital Signs    Vitals:   01/11/22 0400 01/11/22 0728 01/11/22 1111 01/11/22 1113  BP: (!) 155/65 (!) 174/92  (!) 144/58  Pulse: 86 100 99 83  Resp: '18 20 18   '$ Temp: 97.9 F (36.6 C) 98 F (36.7 C) 97.8 F (36.6 C)   TempSrc: Axillary     SpO2: 97% 95% 94% 96%  Weight: 83.3 kg     Height:        Intake/Output Summary (Last 24 hours) at 01/11/2022 1400 Last data filed at 01/10/2022 2145 Gross per 24 hour  Intake 240 ml  Output 350 ml  Net -110 ml      01/11/2022    4:00 AM 01/09/2022   10:59 AM 01/06/2022   10:21 AM  Last 3 Weights  Weight (lbs) 183 lb 10.3 oz 212 lb 8.4 oz 186 lb 3.2 oz  Weight (kg) 83.3 kg 96.4 kg 84.46 kg      Telemetry    Sinus to sinus tachycardia rates of 90-101 - Personally Reviewed  ECG    No new tracings - Personally Reviewed  Physical Exam   GEN: No acute distress.   Neck: No JVD Cardiac: RRR, no murmurs, rubs, or gallops.  Respiratory: Clear to auscultation bilaterally. GI: Soft, nontender, non-distended  MS: No edema; No deformity. Neuro:  Nonfocal  Psych: Normal affect   Labs    High Sensitivity Troponin:  No results for  input(s): "TROPONINIHS" in the last 720 hours.   Chemistry Recent Labs  Lab 01/09/22 1104 01/09/22 1802  NA 139  --   K 4.6  --   CL 105  --   CO2 25  --   GLUCOSE 152*  --   BUN 29*  --   CREATININE 1.57* 1.51*  CALCIUM 9.4  --   PROT 7.5  --   ALBUMIN 3.9  --   AST 20  --   ALT 16  --   ALKPHOS 109  --   BILITOT 0.9  --   GFRNONAA 32* 33*  ANIONGAP 9  --     Lipids  Recent Labs  Lab 01/10/22 0621  CHOL 130  TRIG 125  HDL 52  LDLCALC 53  CHOLHDL 2.5    Hematology Recent Labs  Lab 01/09/22 1104 01/09/22 1802  WBC 9.2 8.3  RBC 4.04 4.11  HGB 12.2 12.5  HCT 38.5 38.8  MCV 95.3 94.4  MCH 30.2 30.4  MCHC 31.7 32.2  RDW 13.4 13.5  PLT 168 169   Thyroid No results for input(s): "TSH", "FREET4" in the last 168 hours.  BNPNo results for input(s): "  BNP", "PROBNP" in the last 168 hours.  DDimer No results for input(s): "DDIMER" in the last 168 hours.   Radiology    MR BRAIN WO CONTRAST  Result Date: 01/09/2022 CLINICAL DATA:  TiA. Stroke follow-up. Slurred speech 2 weeks. EXAM: MRI HEAD WITHOUT CONTRAST TECHNIQUE: Multiplanar, multiecho pulse sequences of the brain and surrounding structures were obtained without intravenous contrast. COMPARISON:  CT head without contrast 01/09/2022. MR head without contrast 11/13/2021 FINDINGS: Brain: Acute/subacute nonhemorrhagic infarct is present in the anterior right thalamus. A second acute/subacute nonhemorrhagic infarct is present in the left centrum semi ovale measuring 8 mm. T2 and FLAIR hyperintensities are associated with both of the acute/subacute infarcts. Chronic encephalomalacia of the medial right occipital lobe and posteroinferior right parietal lobe is stable. Advanced diffuse white matter disease is otherwise similar to the prior exam. No acute hemorrhage or mass lesion is present. White matter changes extend into the brainstem. Cerebellum is unremarkable. The internal auditory canals are within normal limits. No  significant extraaxial fluid collection is present. Vascular: Flow is present in the major intracranial arteries. Skull and upper cervical spine: The craniocervical junction is normal. Upper cervical spine is within normal limits. Marrow signal is unremarkable. Sinuses/Orbits: Bilateral mastoid effusions are present, right greater than left. The paranasal sinuses are clear. Bilateral lens replacements are noted. Globes and orbits are otherwise unremarkable. IMPRESSION: 1. Acute/subacute nonhemorrhagic infarct involving the anterior right thalamus. 2. 8 mm acute/subacute nonhemorrhagic infarct of the left centrum semi ovale. 3. Chronic encephalomalacia of the medial right occipital lobe and posteroinferior right parietal lobe. 4. Advanced diffuse white matter disease bilaterally is otherwise similar to the prior exam. This likely reflects the sequela of chronic microvascular ischemia. 5. Bilateral mastoid effusions, right greater than left. Electronically Signed   By: San Morelle M.D.   On: 01/09/2022 14:43    Cardiac Studies  TTE 11/14/21 1. Left ventricular ejection fraction, by estimation, is 60 to 65%. The  left ventricle has normal function. The left ventricle has no regional  wall motion abnormalities. Left ventricular diastolic parameters are  consistent with Grade I diastolic  dysfunction (impaired relaxation).   2. Right ventricular systolic function is normal. The right ventricular  size is normal.   3. Left atrial size was mildly dilated.   4. The mitral valve is normal in structure. Trivial mitral valve  regurgitation. No evidence of mitral stenosis.   5. The aortic valve is tricuspid. There is mild calcification of the  aortic valve. Aortic valve regurgitation is not visualized. Aortic valve  sclerosis is present, with no evidence of aortic valve stenosis.   6. The inferior vena cava is normal in size with greater than 50%  respiratory variability, suggesting right atrial  pressure of 3 mmHg.   7. Agitated saline contrast bubble study was negative, with no evidence  of any interatrial shunt.   Patient Profile     88 y.o. female with a history of hypertension, stroke, endometrial cancer who is being seen and evaluated for recurrent stroke.  Assessment & Plan    Recurrent stroke possibly cardioembolic -Recurrent stroke with her for stroke being this summer concerning for cardioembolic -MRI of the brain shows areas of lunar Kerr infarct, diffuse chronic microvascular ischemia, suspect stroke could be related to intracranial atherosclerosis -Cardiology was consulted to evaluate for transesophageal echocardiogram -Echocardiogram reviewed from July shows LVEF normal and normal bubble study -After discussing procedure with the patient she will undergo TEE tomorrow -She will be n.p.o. after midnight tonight -  We will need a ZIO monitor on discharge and possibly a loop recorder if extended monitoring is needed -Continue aspirin and Plavix -LDL 53, patient previously on rosuvastatin recommend continue with statin therapy  Hypertension -Blood pressure 144/58 -She has been allowed to have permissive hypertension for 24 hours -Previously was not on antihypertensives at home -Vital signs per unit protocol  Hyperlipidemia -Recent LDL 53 -Recommend restarting PTA rosuvastatin  Shared Decision Making/Informed Consent The risks [esophageal damage, perforation (1:10,000 risk), bleeding, pharyngeal hematoma as well as other potential complications associated with conscious sedation including aspiration, arrhythmia, respiratory failure and death], benefits (treatment guidance and diagnostic support) and alternatives of a transesophageal echocardiogram were discussed in detail with Ms. Gilvin and she is willing to proceed.       For questions or updates, please contact [place] Please consult [email or web address] for contact info under        Signed, Euva Rundell, NP  01/11/2022, 2:00 PM

## 2022-01-12 ENCOUNTER — Inpatient Hospital Stay (HOSPITAL_COMMUNITY)
Admit: 2022-01-12 | Discharge: 2022-01-12 | Disposition: A | Discharge status: Home / Self Care | Payer: Medicare Other | Attending: Cardiology | Admitting: Cardiology

## 2022-01-12 ENCOUNTER — Encounter
Admission: EM | Disposition: A | Discharge status: Home / Self Care | Payer: Self-pay | Source: Home / Self Care | Attending: Internal Medicine

## 2022-01-12 DIAGNOSIS — I10 Essential (primary) hypertension: Secondary | ICD-10-CM | POA: Diagnosis not present

## 2022-01-12 DIAGNOSIS — I63423 Cerebral infarction due to embolism of bilateral anterior cerebral arteries: Secondary | ICD-10-CM

## 2022-01-12 DIAGNOSIS — I6389 Other cerebral infarction: Secondary | ICD-10-CM

## 2022-01-12 DIAGNOSIS — F01A0 Vascular dementia, mild, without behavioral disturbance, psychotic disturbance, mood disturbance, and anxiety: Secondary | ICD-10-CM | POA: Diagnosis not present

## 2022-01-12 DIAGNOSIS — I361 Nonrheumatic tricuspid (valve) insufficiency: Secondary | ICD-10-CM

## 2022-01-12 DIAGNOSIS — I340 Nonrheumatic mitral (valve) insufficiency: Secondary | ICD-10-CM

## 2022-01-12 HISTORY — PX: TEE WITHOUT CARDIOVERSION: SHX5443

## 2022-01-12 SURGERY — ECHOCARDIOGRAM, TRANSESOPHAGEAL
Anesthesia: Moderate Sedation

## 2022-01-12 MED ORDER — LIDOCAINE VISCOUS HCL 2 % MT SOLN
OROMUCOSAL | Status: AC
  Administered 2022-01-12: 15 mL
  Filled 2022-01-12: qty 15

## 2022-01-12 MED ORDER — FENTANYL CITRATE (PF) 100 MCG/2ML IJ SOLN
INTRAMUSCULAR | Status: AC
  Filled 2022-01-12: qty 2

## 2022-01-12 MED ORDER — FENTANYL CITRATE (PF) 100 MCG/2ML IJ SOLN
INTRAMUSCULAR | Status: AC | PRN
  Administered 2022-01-12: 50 ug via INTRAVENOUS

## 2022-01-12 MED ORDER — ASPIRIN 81 MG PO TBEC: 81.0000 mg | DELAYED_RELEASE_TABLET | Freq: Every day | ORAL | 0 refills | Status: AC

## 2022-01-12 MED ORDER — SODIUM CHLORIDE FLUSH 0.9 % IV SOLN
INTRAVENOUS | Status: AC
  Filled 2022-01-12: qty 10

## 2022-01-12 MED ORDER — BUTAMBEN-TETRACAINE-BENZOCAINE 2-2-14 % EX AERO
INHALATION_SPRAY | CUTANEOUS | Status: AC
  Administered 2022-01-12: 3
  Filled 2022-01-12: qty 5

## 2022-01-12 MED ORDER — MIDAZOLAM HCL 5 MG/5ML IJ SOLN
INTRAMUSCULAR | Status: AC | PRN
  Administered 2022-01-12: 2 mg via INTRAVENOUS

## 2022-01-12 MED ORDER — MIDAZOLAM HCL 2 MG/2ML IJ SOLN
INTRAMUSCULAR | Status: AC
  Filled 2022-01-12: qty 4

## 2022-01-12 NOTE — Discharge Instructions (Signed)
Follow-up with PCP in 1 week. Follow-up with neurology in 3 weeks before aspirin is completed.

## 2022-01-12 NOTE — Care Management Important Message (Signed)
Important Message  Patient Details  Name: Valerie Ramirez MRN: [id number] Date of Birth: [birth year]   Medicare Important Message Given:  Yes     Dannette Barbara 01/12/2022, 11:22 AM

## 2022-01-12 NOTE — Interval H&P Note (Signed)
History and Physical Interval Note:  01/12/2022 7:38 AM  Valerie Ramirez  has presented today for surgery, with the diagnosis of stroke.  The various methods of treatment have been discussed with the patient and family. After consideration of risks, benefits and other options for treatment, the patient has consented to  Procedure(s): TRANSESOPHAGEAL ECHOCARDIOGRAM (TEE) ([name]) as a surgical intervention.  The patient's history has been reviewed, patient examined, no change in status, stable for surgery.  I have reviewed the patient's chart and labs.  Questions were answered to the patient's satisfaction.     Valerie Ramirez

## 2022-01-12 NOTE — Progress Notes (Signed)
*  PRELIMINARY RESULTS* Echocardiogram 2D Echocardiogram has been performed.  Sherrie Sport 01/12/2022, 8:16 AM

## 2022-01-12 NOTE — H&P (Signed)
H&P Addendum, pre-cTEE  Patient was seen and evaluated prior to TEEprocedure Symptoms, prior testing details again confirmed with the patient Patient examined, no significant change from prior exam Lab work reviewed in detail personally by myself Patient understands risk and benefit of the procedure,  After careful review of history and examination, the risks and benefits of transesophageal echocardiogram have been explained including risks of esophageal damage, perforation (1:10,000 risk), bleeding, pharyngeal hematoma as well as other potential complications associated with conscious sedation including aspiration, arrhythmia, respiratory failure and death. Alternatives to treatment were discussed, questions were answered. Patient is willing to proceed.   Patient willing to proceed.  Signed, Esmond Plants, MD, Ph.D [facility]

## 2022-01-12 NOTE — Progress Notes (Signed)
Pt A/Ox4 upon review of AVS. Patients family also at bedside to review papers. Patient tolerating breakfast. Will escort patient to entrance when ready.

## 2022-01-12 NOTE — Progress Notes (Signed)
Transesophageal Echocardiogram :  Indication: Stroke, rule out embolic phenomenon Requesting/ordering  physician: Dr. Roosevelt Locks  Procedure: Benzocaine spray x2 and 2 mls x 2 of viscous lidocaine were given orally to provide local anesthesia to the oropharynx. The patient was positioned supine on the left side, bite block provided. The patient was moderately sedated with the doses of versed and fentanyl as detailed below.  Using digital technique an omniplane probe was advanced into the distal esophagus without incident.   Moderate sedation: 1. Sedation used:  Versed: 2 mg IV, Fentanyl: 50 mcg IV 2. Time administered: 7:40 AM   time when patient started recovery: 8:05 AM Total sedation time 25 minutes 3. I was face to face during this time  See report in EPIC  for complete details: Please see full report for complete details In brief, transgastric imaging revealed normal LV function with no RWMAs and no mural apical thrombus.  .  Estimated ejection fraction was 55%.  Right sided cardiac chambers were normal with no evidence of pulmonary hypertension.  Imaging of the septum showed no ASD or VSD Bubble study was negative for shunt 2D and color flow confirmed no PFO  The LA was well visualized in orthogonal views.  There was no spontaneous contrast and no thrombus in the LA and LA appendage   The descending thoracic aorta had no  mural aortic debris with no evidence of aneurysmal dilation or disection.  Moderate diffuse aortic atherosclerosis in the aortic arch, descending aorta.    Ida Rogue 01/12/2022 8:08 AM

## 2022-01-12 NOTE — Discharge Summary (Addendum)
Physician Discharge Summary   Patient: Valerie Ramirez MRN: [id number] DOB: 07-Oct-1932  Admit date:     01/09/2022  Discharge date: 01/12/22  Discharge Physician: [name]   PCP: Abner Greenspan, MD   Recommendations at discharge:   Follow-up with PCP in 1 week. Follow-up with neurology before aspirin is completed.  Discharge Diagnoses: Principal Problem:   Acute ischemic stroke [facility]) Active Problems:   Hypothyroidism   Essential hypertension   Osteoarthritis   Obesity (BMI 30-39.9)   Chronic kidney disease, stage 3b ([place])   CAD S/P percutaneous coronary angioplasty   Cerebrovascular accident (CVA) ([place])   Dyslipidemia   Vascular dementia ([place]) Acute embolic stroke, not acute ischemic stroke. Resolved Problems:   * No resolved hospital problems. *  Hospital Course: Valerie Ramirez is a 88 y.o. female with medical history significant of coronary artery disease, endometrial cancer, essential hypertension, dyslipidemia, hypothyroidism, and osteoarthritis who came to the hospital with complaints of 2 weeks history of slurred speech. MRI showed: Acute/subacute nonhemorrhagic infarct involving the anterior right thalamus. 8 mm acute/subacute nonhemorrhagic infarct of the left centrum semi ovale. Chronic encephalomalacia of the medial right occipital lobe and posteroinferior right parietal lobe. Advanced diffuse white matter disease bilaterally. Patient treated with aspirin Plavix, placed on telemetry.  Assessment and Plan:  Acute embolic stroke. Vascular dementia. Dyslipidemia. Patient has been seen by speech therapy, does not seem to have any additional aspiration risk.  But the patient scored a 15 out of 30 on SLUMS.  Probably due to vascular dementia. Fasting lipid panel showed LDL 53, at goal. TEE is performed on 9/26, did not show any thrombosis.  However, patient had moderate atherosclerosis in the aortic arch and ascending aorta.  This could be the source of recurrent  stroke.  Patient has a pending ZIO recorder to monitor for atrial fibrillation.   We will continue aspirin, Plavix.  I have prescribed aspirin for 21 days, patient should this be seen by neurology before that.  Consider dual antiplatelets for long-term if a ZIO does not show any atrial fibrillation.  Coronary artery disease. Essential hypertension. Blood pressure only mildly elevated, patient be seen by PCP in the office to determine if blood pressure medicine need to be added.   Chronic kidney disease stage IIIb. Renal function stable.       Consultants: Neurology Procedures performed: TEE  Disposition: Home Diet recommendation:  Discharge Diet Orders (From admission, onward)     Start     Ordered   01/12/22 0000  Diet - low sodium heart healthy        01/12/22 1004           Cardiac diet DISCHARGE MEDICATION: Allergies as of 01/12/2022       Reactions   Allopurinol Nausea Only   Lipitor [atorvastatin Calcium] Itching, Other (See Comments)   Leg aches and aching all over   Morphine And Related Other (See Comments)   Makes the patient "loopy,"    Simvastatin Other (See Comments)   MUSCLE PAIN   Tape Itching, Rash, Other (See Comments)   "Cannot use paper tape" --- also causes blisters. Use plastic tape        Medication List     STOP taking these medications    rosuvastatin 20 MG tablet Commonly known as: CRESTOR       TAKE these medications    aspirin EC 81 MG tablet Take 1 tablet (81 mg total) by mouth daily for 21 days. Swallow  whole. Start taking on: January 13, 2022   clopidogrel 75 MG tablet Commonly known as: PLAVIX Take 1 tablet (75 mg total) by mouth daily.   Colchicine 0.6 MG Caps Take 1 capsule by mouth in the morning and at bedtime. What changed: how much to take   diclofenac Sodium 1 % Gel Commonly known as: Voltaren Arthritis Pain Apply 4 g topically 4 (four) times daily.   donepezil 5 MG tablet Commonly known as:  ARICEPT Take 1 tablet (5 mg total) by mouth at bedtime.   DULoxetine 30 MG capsule Commonly known as: CYMBALTA TAKE ONE CAPSULE BY MOUTH DAILY   FeroSul 325 (65 FE) MG tablet Generic drug: ferrous sulfate TAKE ONE TABLET BY MOUTH DAILY   lidocaine 5 % Commonly known as: Lidoderm Place 1 patch onto the skin daily. Remove & Discard patch within 12 hours or as directed by MD   MAG-SR PLUS CALCIUM PO Take by mouth. Taking 1 tab once for dizziness   multivitamin-iron-minerals-folic acid chewable tablet Chew 1 tablet by mouth daily.   nitroGLYCERIN 0.4 MG SL tablet Commonly known as: NITROSTAT Place 1 tablet (0.4 mg total) under the tongue every 5 (five) minutes x 3 doses as needed for chest pain. What changed: when to take this   VITAMIN B-12 PO Take 1 tablet by mouth daily.   Vitamin D-3 25 MCG (1000 UT) Caps Take 1,000 Units by mouth daily.        Follow-up Information     Tower, Wynelle Fanny, MD Follow up in 1 day(s).   Specialties: Family Medicine, Radiology Contact information: [place] [address] [phone]         [place], Peter M, MD .   Specialty: Cardiology Contact information: [address] [place] [address] [phone]                Discharge Exam: [place] [employer]   01/09/22 1059 01/11/22 0400 01/12/22 0729  Weight: 96.4 kg 83.3 kg 83.3 kg   General exam: Appears calm and comfortable  Respiratory system: Clear to auscultation. Respiratory effort normal. Cardiovascular system: S1 & S2 heard, RRR. No JVD, murmurs, rubs, gallops or clicks. No pedal edema. Gastrointestinal system: Abdomen is nondistended, soft and nontender. No organomegaly or masses felt. Normal bowel sounds heard. Central nervous system: Alert and oriented. No focal neurological deficits. Extremities: Symmetric 5 x 5 power. Skin: No rashes, lesions or ulcers Psychiatry: Judgement and insight appear normal. Mood & affect appropriate.     Condition at discharge: good  The results of significant diagnostics from this hospitalization (including imaging, microbiology, ancillary and laboratory) are listed below for reference.   Imaging Studies: ECHO TEE  Result Date: 01/12/2022    TRANSESOPHOGEAL ECHO REPORT   Patient Name:   ORIT SANVILLE Date of Exam: 01/12/2022 Medical Rec #:  [id number]       Height:       62.0 in Accession #:    [id number]      Weight:       183.6 lb Date of Birth:  1932-10-14       BSA:          1.844 m Patient Age:    36 years        BP:           170/90 mmHg Patient Gender: F               HR:  70 bpm. Exam Location:  [facility] Procedure: Transesophageal Echo Indications:    hx of CVA, PAD, vascular dementia  History:        Patient has prior history of Echocardiogram examinations.                 Stroke; Risk Factors:Hypertension.  Sonographer:    JERRY Referring Phys: [id number] SHERI HAMMOCK PROCEDURE: After discussion of the risks and benefits of a TEE, an informed consent was obtained from the patient. The transesophogeal probe was passed without difficulty through the esophogus of the patient. Local oropharyngeal anesthetic was provided with viscous lidocaine and Cetacaine. Sedation performed by performing physician. Patients was under conscious sedation during this procedure. Anesthetic administered: 26mg of Fentanyl, 2.'0mg'$  of Versed. Image quality was excellent. The patient's vital signs; including heart rate, blood pressure, and oxygen saturation; remained stable throughout the procedure. The patient developed no complications during the procedure. IMPRESSIONS  1. Left ventricular ejection fraction, by estimation, is 60 to 65%. The left ventricle has normal function. The left ventricle has no regional wall motion abnormalities.  2. Right ventricular systolic function is normal. The right ventricular size is normal.  3. No left atrial/left atrial appendage thrombus was detected.  4. The mitral valve is  normal in structure. Mild mitral valve regurgitation. No evidence of mitral stenosis.  5. The aortic valve is normal in structure. Aortic valve regurgitation is not visualized. Aortic valve sclerosis/calcification is present, without any evidence of aortic stenosis.  6. There is Moderate (Grade III) atheroma plaque involving the aortic arch and descending aorta.  7. The inferior vena cava is normal in size with greater than 50% respiratory variability, suggesting right atrial pressure of 3 mmHg.  8. Agitated saline contrast bubble study was negative, with no evidence of any interatrial shunt. Conclusion(s)/Recommendation(s): Normal biventricular function without evidence of hemodynamically significant valvular heart disease. FINDINGS  Left Ventricle: Left ventricular ejection fraction, by estimation, is 60 to 65%. The left ventricle has normal function. The left ventricle has no regional wall motion abnormalities. The left ventricular internal cavity size was normal in size. There is  no left ventricular hypertrophy. Right Ventricle: The right ventricular size is normal. No increase in right ventricular wall thickness. Right ventricular systolic function is normal. Left Atrium: Left atrial size was normal in size. No left atrial/left atrial appendage thrombus was detected. Right Atrium: Right atrial size was normal in size. Pericardium: There is no evidence of pericardial effusion. Mitral Valve: The mitral valve is normal in structure. There is mild calcification of the mitral valve leaflet(s). Mild mitral valve regurgitation. No evidence of mitral valve stenosis. There is no evidence of mitral valve vegetation. Tricuspid Valve: The tricuspid valve is normal in structure. Tricuspid valve regurgitation is mild . No evidence of tricuspid stenosis. There is no evidence of tricuspid valve vegetation. Aortic Valve: The aortic valve is normal in structure. Aortic valve regurgitation is not visualized. Aortic valve  sclerosis/calcification is present, without any evidence of aortic stenosis. There is no evidence of aortic valve vegetation. Pulmonic Valve: The pulmonic valve was normal in structure. Pulmonic valve regurgitation is not visualized. No evidence of pulmonic stenosis. There is no evidence of pulmonic valve vegetation. Aorta: The aortic root is normal in size and structure. There is moderate (Grade III) atheroma plaque involving the aortic arch and descending aorta. Venous: The inferior vena cava is normal in size with greater than 50% respiratory variability, suggesting right atrial pressure of 3 mmHg. IAS/Shunts: No atrial level shunt  detected by color flow Doppler. Agitated saline contrast was given intravenously to evaluate for intracardiac shunting. Agitated saline contrast bubble study was negative, with no evidence of any interatrial shunt. There  is no evidence of a patent foramen ovale. There is no evidence of an atrial septal defect. Ida Rogue MD Electronically signed by Ida Rogue MD Signature Date/Time: 01/12/2022/8:20:00 AM    Final    MR BRAIN WO CONTRAST  Result Date: 01/09/2022 CLINICAL DATA:  TiA. Stroke follow-up. Slurred speech 2 weeks. EXAM: MRI HEAD WITHOUT CONTRAST TECHNIQUE: Multiplanar, multiecho pulse sequences of the brain and surrounding structures were obtained without intravenous contrast. COMPARISON:  CT head without contrast 01/09/2022. MR head without contrast 11/13/2021 FINDINGS: Brain: Acute/subacute nonhemorrhagic infarct is present in the anterior right thalamus. A second acute/subacute nonhemorrhagic infarct is present in the left centrum semi ovale measuring 8 mm. T2 and FLAIR hyperintensities are associated with both of the acute/subacute infarcts. Chronic encephalomalacia of the medial right occipital lobe and posteroinferior right parietal lobe is stable. Advanced diffuse white matter disease is otherwise similar to the prior exam. No acute hemorrhage or mass lesion  is present. White matter changes extend into the brainstem. Cerebellum is unremarkable. The internal auditory canals are within normal limits. No significant extraaxial fluid collection is present. Vascular: Flow is present in the major intracranial arteries. Skull and upper cervical spine: The craniocervical junction is normal. Upper cervical spine is within normal limits. Marrow signal is unremarkable. Sinuses/Orbits: Bilateral mastoid effusions are present, right greater than left. The paranasal sinuses are clear. Bilateral lens replacements are noted. Globes and orbits are otherwise unremarkable. IMPRESSION: 1. Acute/subacute nonhemorrhagic infarct involving the anterior right thalamus. 2. 8 mm acute/subacute nonhemorrhagic infarct of the left centrum semi ovale. 3. Chronic encephalomalacia of the medial right occipital lobe and posteroinferior right parietal lobe. 4. Advanced diffuse white matter disease bilaterally is otherwise similar to the prior exam. This likely reflects the sequela of chronic microvascular ischemia. 5. Bilateral mastoid effusions, right greater than left. Electronically Signed   By: San Morelle M.D.   On: 01/09/2022 14:43   CT HEAD WO CONTRAST  Result Date: 01/09/2022 CLINICAL DATA:  Altered mental status EXAM: CT HEAD WITHOUT CONTRAST TECHNIQUE: Contiguous axial images were obtained from the base of the skull through the vertex without intravenous contrast. RADIATION DOSE REDUCTION: This exam was performed according to the departmental dose-optimization program which includes automated exposure control, adjustment of the mA and/or kV according to patient size and/or use of iterative reconstruction technique. COMPARISON:  Previous studies including the examination of 11/13/2021 FINDINGS: Brain: No acute intracranial findings are seen. In image 17 of series 2, there is a 3 mm high density to the left of anterior falx which has not changed significantly in comparison with  previous studies suggesting possible small meningioma. There is no adjacent edema or mass effect. There are no definite signs of recent bleeding within the cranium. Old infarct is seen in right parieto-occipital lobe. Cortical sulci are prominent. There is decreased density in periventricular and subcortical white matter. Vascular: Scattered arterial calcifications are seen. Skull: Unremarkable. Sinuses/Orbits: Unremarkable. Other: None. IMPRESSION: No acute intracranial findings are seen a noncontrast CT brain. Atrophy. Small-vessel disease. Old infarct in right parieto-occipital cortex. 3 mm small calcific density adjacent to the anterior falx which has not changed suggesting possible small meningioma or dystrophic dural calcification. Electronically Signed   By: Elmer Picker M.D.   On: 01/09/2022 12:37    Microbiology: Results for orders placed or  performed during the hospital encounter of 01/09/22  Resp Panel by RT-PCR (Flu A&B, Covid) Anterior Nasal Swab     Status: None   Collection Time: 01/09/22 11:04 AM   Specimen: Anterior Nasal Swab  Result Value Ref Range Status   SARS Coronavirus 2 by RT PCR NEGATIVE NEGATIVE Final    Comment: (NOTE) SARS-CoV-2 target nucleic acids are NOT DETECTED.  The SARS-CoV-2 RNA is generally detectable in upper respiratory specimens during the acute phase of infection. The lowest concentration of SARS-CoV-2 viral copies this assay can detect is 138 copies/mL. A negative result does not preclude SARS-Cov-2 infection and should not be used as the sole basis for treatment or other patient management decisions. A negative result may occur with  improper specimen collection/handling, submission of specimen other than nasopharyngeal swab, presence of viral mutation(s) within the areas targeted by this assay, and inadequate number of viral copies(<138 copies/mL). A negative result must be combined with clinical observations, patient history, and  epidemiological information. The expected result is Negative.  Fact Sheet for Patients:  EntrepreneurPulse.com.au  Fact Sheet for Healthcare Providers:  IncredibleEmployment.be  This test is no t yet approved or cleared by the [place] FDA and  has been authorized for detection and/or diagnosis of SARS-CoV-2 by FDA under an Emergency Use Authorization (EUA). This EUA will remain  in effect (meaning this test can be used) for the duration of the COVID-19 declaration under Section 564(b)(1) of the Act, 21 U.S.C.section 360bbb-3(b)(1), unless the authorization is terminated  or revoked sooner.       Influenza A by PCR NEGATIVE NEGATIVE Final   Influenza B by PCR NEGATIVE NEGATIVE Final    Comment: (NOTE) The Xpert Xpress SARS-CoV-2/FLU/RSV plus assay is intended as an aid in the diagnosis of influenza from Nasopharyngeal swab specimens and should not be used as a sole basis for treatment. Nasal washings and aspirates are unacceptable for Xpert Xpress SARS-CoV-2/FLU/RSV testing.  Fact Sheet for Patients: EntrepreneurPulse.com.au  Fact Sheet for Healthcare Providers: IncredibleEmployment.be  This test is not yet approved or cleared by the [place] FDA and has been authorized for detection and/or diagnosis of SARS-CoV-2 by FDA under an Emergency Use Authorization (EUA). This EUA will remain in effect (meaning this test can be used) for the duration of the COVID-19 declaration under Section 564(b)(1) of the Act, 21 U.S.C. section 360bbb-3(b)(1), unless the authorization is terminated or revoked.  Performed at [facility], [address] [place], [place] [address]     Labs: CBC: Recent Labs  Lab 01/09/22 1104 01/09/22 1802  WBC 9.2 8.3  NEUTROABS 6.2  --   HGB 12.2 12.5  HCT 38.5 38.8  MCV 95.3 94.4  PLT 168 619   Basic Metabolic Panel: Recent Labs  Lab 01/09/22 1104  01/09/22 1802  NA 139  --   K 4.6  --   CL 105  --   CO2 25  --   GLUCOSE 152*  --   BUN 29*  --   CREATININE 1.57* 1.51*  CALCIUM 9.4  --    Liver Function Tests: Recent Labs  Lab 01/09/22 1104  AST 20  ALT 16  ALKPHOS 109  BILITOT 0.9  PROT 7.5  ALBUMIN 3.9   CBG: No results for input(s): "GLUCAP" in the last 168 hours.  Discharge time spent: greater than 30 minutes.  Signed: [name], MD [facility] 01/12/2022

## 2022-01-13 ENCOUNTER — Telehealth: Payer: Self-pay | Admitting: *Deleted

## 2022-01-13 ENCOUNTER — Encounter: Payer: Self-pay | Admitting: Cardiovascular Disease

## 2022-01-13 NOTE — Patient Outreach (Signed)
  Care Coordination [facility] Note Transition Care Management Follow-up Telephone Call Date of discharge and from where: [id number] How have you been since you were released from the hospital?  She is doing real good. She came home and took her own shower Any questions or concerns? No  Items Reviewed: Did the pt receive and understand the discharge instructions provided? Yes  Medications obtained and verified? Yes  Other? No  Any new allergies since your discharge? No  Dietary orders reviewed? Yes Low sodium heart healthy diet Do you have support at home? Yes   Home Care and Equipment/Supplies: Were home health services ordered? not applicable If so, what is the name of the agency? n  Has the agency set up a time to come to the patient's home? not applicable Were any new equipment or medical supplies ordered?  No What is the name of the medical supply agency? [name] Were you able to get the supplies/equipment? not applicable Do you have any questions related to the use of the equipment or supplies? No  Functional Questionnaire: (I = Independent and D = Dependent) ADLs: I  Bathing/Dressing- I  Meal Prep- D  Eating- I  Maintaining continence- I  Transferring/Ambulation- I  Managing Meds- I  Follow up appointments reviewed:  PCP Hospital f/u appt confirmed? No   Family wants to make follow up appointments [facility] f/u appt confirmed? No  Family wants to make follow up appointments Are transportation arrangements needed? No  If their condition worsens, is the pt aware to call PCP or go to the Emergency Dept.? Yes Was the patient provided with contact information for the PCP's office or ED? Yes Was to pt encouraged to call back with questions or concerns? Yes  SDOH assessments and interventions completed:   Yes  Care Coordination Interventions Activated:  Yes   Care Coordination Interventions:  No Care Coordination interventions needed at this time.   Encounter  Outcome:  Pt. Visit Completed    [place] Management [phone]

## 2022-01-17 ENCOUNTER — Ambulatory Visit: Payer: Medicare Other | Attending: Neurology

## 2022-01-17 DIAGNOSIS — I639 Cerebral infarction, unspecified: Secondary | ICD-10-CM

## 2022-01-26 ENCOUNTER — Ambulatory Visit: Payer: Medicare Other | Admitting: Neurology

## 2022-01-26 ENCOUNTER — Encounter: Payer: Self-pay | Admitting: Neurology

## 2022-01-26 VITALS — BP 192/97 | HR 83 | Ht 62.0 in | Wt 186.2 lb

## 2022-01-26 DIAGNOSIS — I639 Cerebral infarction, unspecified: Secondary | ICD-10-CM

## 2022-01-26 DIAGNOSIS — R471 Dysarthria and anarthria: Secondary | ICD-10-CM | POA: Insufficient documentation

## 2022-01-26 NOTE — Patient Instructions (Signed)
See Dr. Brett Fairy 9am Monday Loop recorder soon Speech therapy Stay on the asapirin and plavix and then plavix alone

## 2022-01-26 NOTE — Progress Notes (Signed)
[facility]    Provider:  Dr Jaynee Eagles Requesting Provider: Glori Bickers Wynelle Fanny, MD Primary Care Provider:  Abner Greenspan, MD  CC:  multiple embolic cryptogenic strokes, occipital neuralgia, memory loss  01/26/2022; Valerie Ramirez is a 88 y.o. female with medical history significant of coronary artery disease, endometrial cancer, essential hypertension, dyslipidemia, hypothyroidism, and osteoarthritis who came to the hospital with complaints of 2 weeks history of slurred speech 6/14 with acute embolic stroke after JUST having a hospitalization 7 days prior for similar. I saw patient 01/06/2022 for stroke and since then she has had more embolic strokes 4/31 this will be her third. MRI showed: Acute/subacute nonhemorrhagic infarct involving the anterior right thalamus. 8 mm acute/subacute nonhemorrhagic infarct of the left centrum semi ovale.Chronic encephalomalacia of the medial right occipital lobe and posteroinferior right parietal lobe. Advanced diffuse white matter disease bilaterally.Patient treated with aspirin Plavix, placed on telemetry.   I saw patient for strokes 01/06/2022: She was again admitted for slurred speech 01/12/2022 with another episode of embolic strokes. She is wearing a heart monitor and has an appointment for loop recorder on the 31st at noon. TEE did not show any etiology. Differential includes cardioembolic strokes vs plaque from vessels. In the meantime she is on asa and plavix and then plavix alone. She still have some problems with speech no numbness or tingling or weakness. She has never had a sleep study. TEE did not show any etiology. Awaiting loop and also will get her seen for a sleep evaluation this coming Monday, spoke with Dr. Brett Fairy and she squeezed her in, much appreciate my wonderful colleague.  TEE 01/12/2022: 1. Left ventricular ejection fraction, by estimation, is 60 to 65%. The  left ventricle has normal function. The left ventricle has no  regional  wall motion abnormalities.   2. Right ventricular systolic function is normal. The right ventricular  size is normal.   3. No left atrial/left atrial appendage thrombus was detected.   4. The mitral valve is normal in structure. Mild mitral valve  regurgitation. No evidence of mitral stenosis.   5. The aortic valve is normal in structure. Aortic valve regurgitation is  not visualized. Aortic valve sclerosis/calcification is present, without  any evidence of aortic stenosis.   6. There is Moderate (Grade III) atheroma plaque involving the aortic  arch and descending aorta.   7. The inferior vena cava is normal in size with greater than 50%  respiratory variability, suggesting right atrial pressure of 3 mmHg.   8. Agitated saline contrast bubble study was negative, with no evidence  of any interatrial shunt.   Conclusion(s)/Recommendation(s): Normal biventricular function without  evidence of hemodynamically significant valvular heart disease.   Patient complains of symptoms per HPI as well as the following symptoms: strokes . Pertinent negatives and positives per HPI. All others negative    HPI 01/06/2022:  Valerie Ramirez is a 88 y.o. female here as requested by Abner Greenspan, MD for stroke, memory problems and occipital nerulgia. PMHx acute embolic stroke, osteoarthritis, remote history of strokes, occipital neuralgia, history of subdural hematoma, cervical spinal fusion and chronic neck pain, hypertension, hyperlipidemia, CKD, CAD.   - Stroke: I reviewed images and discussed with Dr. Erlinda Hong, head of stroke. Appear to be cardioembolic, need 54-MGQ heart monitor and a loop recorder I suspect afib. Sent referral to [facility] and Dr. Rayann Heman. In the meantime, continue plavix, continue managing vascular risk factors like cholesterol, high blood pressure. We reviewed  images together, here with daughter [name]. She has followed with ophthalmology. Blood pressure is  elevated, need to follow up with Dr. Glori Bickers, check it first thing in the morning upon waling, mid day then in evening. LDL 155. HgbA1c 6.2.  - Cervicalgia/occipital neuralgia. Neck pain and pain radiating to the back of the head. Every single day. Ongoing for years. Feels like somoen has hit her it happens acutely, severe, shooting electric pain. She has had cervical surgery in the past, Dr. Lorin Mercy C5-C7 ACDF with solid bony fusion and also multilevel degenerative changes. Discussed physical therapy, medications such as gabapentin or lyrica, voltaren gel (she has some), MRI cervical spine and consider injections such as ESi or medial branch blocks, occipital nerve blocks. At this time she declines options, she just wants to live with it. She has no pain today so hesitate to do occipital nerve bocks today but she can come back anytime. Will give voltaren gel and lidoderm patches. She declines anything else.   - Memory loss; More short term memory, repeating things, forgetting appointments. Daughter lives with her and helps her with every appointments, helps with medications. She can perform her own Adls but needs help with IADLs (driving, going to the store and shopping, not driving). She takes B12 and in 2022 (reviewed labs was elevated). She takes vitamin d. No history of alcohol abuse or smoking. No FHx of dementia. No depression or anxiety. She is happy. She declines any further workup, may be normal cognitive aging, MCI, will continue to follow.    Reviewed notes, labs and imaging from outside physicians, which showed:    Carotid ultrasound :  Right Carotid: The extracranial vessels were near-normal with only minimal  wall                 thickening or plaque.   Left Carotid: The extracranial vessels were near-normal with only minimal  wall                thickening or plaque.   Vertebrals:  Bilateral vertebral arteries demonstrate antegrade flow.  Subclavians: Normal flow hemodynamics were  seen in bilateral subclavian               arteries.   *See table(s) above for measurements and observations.       Echo 11/14/2021:  IMPRESSIONS     1. Left ventricular ejection fraction, by estimation, is 60 to 65%. The  left ventricle has normal function. The left ventricle has no regional  wall motion abnormalities. Left ventricular diastolic parameters are  consistent with Grade I diastolic  dysfunction (impaired relaxation).   2. Right ventricular systolic function is normal. The right ventricular  size is normal.   3. Left atrial size was mildly dilated.   4. The mitral valve is normal in structure. Trivial mitral valve  regurgitation. No evidence of mitral stenosis.   5. The aortic valve is tricuspid. There is mild calcification of the  aortic valve. Aortic valve regurgitation is not visualized. Aortic valve  sclerosis is present, with no evidence of aortic valve stenosis.   6. The inferior vena cava is normal in size with greater than 50%  respiratory variability, suggesting right atrial pressure of 3 mmHg.   7. Agitated saline contrast bubble study was negative, with no evidence  of any interatrial shunt.  11/13/2021; MRI brain: IMPRESSION: 1. Patchy small volume acute nonhemorrhagic right PCA distribution infarct as above. This is superimposed on an underlying chronic right PCA territory infarct. 2.  Additional scattered subcentimeter acute to early subacute nonhemorrhagic ischemic infarcts involving the bilateral cerebral hemispheres as above. 3. Underlying age-related cerebral atrophy with advanced chronic microvascular ischemic disease, with a few scattered remote ischemic infarcts as above.  CT cervical spein 06/2021: 1. No evidence of acute fracture or traumatic malalignment. 2. C5-C7 ACDF with solid bony fusion. 3. Multilevel degenerative change, detailed above.     Electronically Signed   By: Margaretha Sheffield M.D.   On: 06/24/2021 17:13  11/14/2021 MRA  head :   IMPRESSION: 1. Negative for large vessel occlusion. Poor flow in the distal right PCA branches.   2. Intracranial atherosclerosis with other significant stenoses: - Moderate stenosis of the mid Right MCA M1. - Moderate to severe bilateral ACA A2 stenoses. - Severe Left PCA P3 branch stenosis.  Review of Systems: Patient complains of symptoms per HPI as well as the following symptoms memory loss, headache, stoke. Pertinent negatives and positives per HPI. All others negative.   Social History   Socioeconomic History   Marital status: Widowed    Spouse name: Not on file   Number of children: 2   Years of education: Not on file   Highest education level: High school graduate  Occupational History   Not on file  Tobacco Use   Smoking status: Never   Smokeless tobacco: Never  Vaping Use   Vaping Use: Never used  Substance and Sexual Activity   Alcohol use: Never    Alcohol/week: 0.0 standard drinks of alcohol   Drug use: Never   Sexual activity: Never    Birth control/protection: Post-menopausal  Other Topics Concern   Not on file  Social History Narrative   Not on file   Social Determinants of Health   Financial Resource Strain: Low Risk  (07/16/2021)   Overall Financial Resource Strain (CARDIA)    Difficulty of Paying Living Expenses: Not hard at all  Food Insecurity: No Food Insecurity (01/09/2022)   Hunger Vital Sign    Worried About Running Out of Food in the Last Year: Never true    Campus in the Last Year: Never true  Transportation Needs: No Transportation Needs (01/09/2022)   PRAPARE - Hydrologist (Medical): No    Lack of Transportation (Non-Medical): No  Physical Activity: Insufficiently Active (07/16/2021)   Exercise Vital Sign    Days of Exercise per Week: 4 days    Minutes of Exercise per Session: 30 min  Stress: No Stress Concern Present (07/16/2021)   [place]    Feeling of Stress : Not at all  Social Connections: Moderately Isolated (07/16/2021)   Social Connection and Isolation Panel [NHANES]    Frequency of Communication with Friends and Family: More than three times a week    Frequency of Social Gatherings with Friends and Family: Twice a week    Attends Religious Services: More than 4 times per year    Active Member of [employer] or Organizations: No    Attends Archivist Meetings: Never    Marital Status: Widowed  Intimate Partner Violence: Not At Risk (01/09/2022)   Humiliation, Afraid, Rape, and Kick questionnaire    Fear of Current or Ex-Partner: No    Emotionally Abused: No    Physically Abused: No    Sexually Abused: No    Family History  Problem Relation Age of Onset   Heart disease Father    Heart attack  Father    Stroke Father    Heart disease Sister        CABG   Stroke Sister    Cancer Sister    Bladder Cancer Sister    Stroke Sister        "massive brain bleed"   Leukemia Brother    Lung cancer Brother    Leukemia Brother    Stomach cancer Brother    Heart disease Brother        CABG    Past Medical History:  Diagnosis Date   Arthritis    OA   CAD (coronary artery disease)    stent December 2018   Cancer [facility])    endometrial   GERD (gastroesophageal reflux disease)    Hearing loss    History of kidney stones    Hyperlipidemia    Hypertension    Hypothyroid    "not in years"   Shortness of breath dyspnea    04/25/17- not anymore    Patient Active Problem List   Diagnosis Date Noted   Vascular dementia ([place]) 01/10/2022   Acute ischemic stroke ([place]) 01/09/2022   Dyslipidemia 01/09/2022   Cerebrovascular accident (CVA) ([place]) 01/06/2022   Mild cognitive impairment 01/06/2022   Acute CVA (cerebrovascular accident) ([place]) 11/14/2021   Fall 06/24/2021   Syncope and collapse 06/24/2021   Acute pain of both knees 06/24/2021   Light headedness 06/24/2021   Bilateral impacted  cerumen 06/24/2021   Heme positive stool 02/24/2021   UTI (urinary tract infection) 03/25/2020   History of CVA (cerebrovascular accident) 02/14/2020   Headache 02/14/2020   Diarrhea 12/26/2017   Dizziness 12/26/2017   Depression 10/26/2017   H/O subdural hemorrhage 09/18/2017   CAD S/P percutaneous coronary angioplasty 09/18/2017   Closed fracture of nasal bones    Epistaxis    Elevated transaminase level 08/03/2017   Radial artery injury, left, sequela 06/15/2017   H/O: CVA (cerebrovascular accident) 05/26/2017   Sick sinus syndrome ([place]) 05/25/2017   Exertional chest pain 04/05/2017   Chronic kidney disease, stage 3b ([place]) 04/05/2017   Anemia 04/05/2017   Thoracic back pain 09/15/2016   History of radius fracture 06/01/2016   AKI (acute kidney injury) ([place]) 04/25/2016   Leukocytosis 04/25/2016   Status post cervical spinal fusion 08/18/2015   Acute pain of right shoulder 05/16/2015   Osteoarthritis, hip, bilateral 08/12/2014   Hearing loss in right ear 06/12/2014   Colon cancer screening 08/10/2013   Seborrheic keratoses, inflamed 08/10/2013   History of fall 04/10/2013   Encounter for Medicare annual wellness exam 08/08/2012   Gout 07/14/2012   History of endometrial cancer 05/25/2012   Degenerative joint disease of cervical spine 05/03/2011   Other screening mammogram 03/31/2011   Prediabetes 09/26/2007   Obesity (BMI 30-39.9) 07/12/2007   Essential hypertension 07/11/2007   History of cardiomyopathy 07/11/2007   Osteoarthritis 07/11/2007   MIXED INCONTINENCE URGE AND STRESS 07/11/2007   Hypothyroidism 10/05/2006   HYPERCHOLESTEROLEMIA, PURE 10/05/2006    Past Surgical History:  Procedure Laterality Date   ABDOMINAL HYSTERECTOMY     ANTERIOR CERVICAL DECOMP/DISCECTOMY FUSION [name] 08/18/2015   Procedure: C5-6, C6-7 Anterior Cervical Discectomy and Fusion, Allograft, Plate;  Surgeon: Marybelle Killings, MD;  Location: [place];  Service: Orthopedics;  Laterality: [name];   bone  spur removed Right    shoulder   BOWEL RESECTION  07/04/2012   Procedure: SMALL BOWEL RESECTION;  Surgeon: Alvino Chapel, MD;  Location: [facility];  Service: Gynecology;;   BREAST  BIOPSY     CHOLECYSTECTOMY     CORONARY STENT INTERVENTION [name] 04/06/2017   Procedure: CORONARY STENT INTERVENTION;  Surgeon: Wellington Hampshire, MD;  Location: [facility];  Service: Cardiovascular;  Laterality: [name];   DILATION AND CURETTAGE OF UTERUS  1999   EYE SURGERY Bilateral    cataracts   FALSE ANEURYSM REPAIR Right 04/26/2017   Procedure: 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;  Surgeon: Conrad Kermit, MD;  Location: [place];  Service: Vascular;  Laterality: Right;   HAND SURGERY  12/2008   after hand fx/due to fall   JOINT REPLACEMENT Bilateral 07/1998   knees   KNEE ARTHROPLASTY  05/23/1998   total right   LAPAROTOMY Bilateral 07/04/2012   Procedure: EXPLORATORY LAPAROTOMY TOTAL ABDOMINAL HYSTERECTOMY BILATERAL SALPINGO-OOPHORECTOMY with small bowel resection ;  Surgeon: Alvino Chapel, MD;  Location: [facility];  Service: Gynecology;  Laterality: Bilateral;   LEFT HEART CATH AND CORONARY ANGIOGRAPHY [name] 04/06/2017   Procedure: LEFT HEART CATH AND CORONARY ANGIOGRAPHY;  Surgeon: Jolaine Artist, MD;  Location: [facility];  Service: Cardiovascular;  Laterality: [name];   polyp removal  1999   TEE WITHOUT CARDIOVERSION [name] 01/12/2022   Procedure: TRANSESOPHAGEAL ECHOCARDIOGRAM (TEE);  Surgeon: Minna Merritts, MD;  Location: [facility];  Service: Cardiovascular;  Laterality: [name];   TUBAL LIGATION      Current Outpatient Medications  Medication Sig Dispense Refill   aspirin EC 81 MG tablet Take 1 tablet (81 mg total) by mouth daily for 21 days. Swallow whole. 21 tablet 0   Cholecalciferol (VITAMIN D-3) 25 MCG (1000 UT) CAPS Take 1,000 Units by mouth daily.     clopidogrel (PLAVIX) 75 MG tablet Take 1 tablet (75 mg total) by mouth daily. 90 tablet 3   Colchicine 0.6 MG  CAPS Take 1 capsule by mouth in the morning and at bedtime. (Patient taking differently: Take 0.6 mg by mouth in the morning and at bedtime.) 60 capsule 1   Cyanocobalamin (VITAMIN B-12 PO) Take 1 tablet by mouth daily.     diclofenac Sodium (VOLTAREN ARTHRITIS PAIN) 1 % GEL Apply 4 g topically 4 (four) times daily. 100 g 11   donepezil (ARICEPT) 5 MG tablet Take 1 tablet (5 mg total) by mouth at bedtime. 30 tablet 6   DULoxetine (CYMBALTA) 30 MG capsule TAKE ONE CAPSULE BY MOUTH DAILY (Patient taking differently: Take 30 mg by mouth daily.) 90 capsule 3   FEROSUL 325 (65 Fe) MG tablet TAKE ONE TABLET BY MOUTH DAILY 90 tablet 1   lidocaine (LIDODERM) 5 % Place 1 patch onto the skin daily. Remove & Discard patch within 12 hours or as directed by MD 30 patch 11   Magnesium Chloride (MAG-SR PLUS CALCIUM PO) Take by mouth. Taking 1 tab once for dizziness     multivitamin-iron-minerals-folic acid (CENTRUM) chewable tablet Chew 1 tablet by mouth daily. 90 tablet 0   nitroGLYCERIN (NITROSTAT) 0.4 MG SL tablet Place 1 tablet (0.4 mg total) under the tongue every 5 (five) minutes x 3 doses as needed for chest pain. (Patient taking differently: Place 0.4 mg under the tongue every 5 (five) minutes as needed for chest pain.) 25 tablet 12   No current facility-administered medications for this visit.    Allergies as of 01/26/2022 - Review Complete 01/26/2022  Allergen Reaction Noted   Allopurinol Nausea Only 08/10/2013   Lipitor [atorvastatin calcium] Itching and Other (See Comments) 03/31/2011   Morphine and related Other (  See Comments) 10/15/2017   Simvastatin Other (See Comments) 07/29/2008   Tape Itching, Rash, and Other (See Comments) 06/30/2012    Vitals: BP (!) 192/97 Comment: provider aware  Pulse 83   Ht '5\' 2"'$  (1.575 m)   Wt 186 lb 3.2 oz (84.5 kg)   BMI 34.06 kg/m  Last Weight:  Wt Readings from Last 1 Encounters:  01/26/22 186 lb 3.2 oz (84.5 kg)   Last Height:   Ht Readings from  Last 1 Encounters:  01/26/22 '5\' 2"'$  (1.575 m)     Physical exam: Exam: Gen: NAD, conversant, well nourised, obese, well groomed                     CV: RRR, no MRG. No Carotid Bruits. No peripheral edema, warm, nontender Eyes: Conjunctivae clear without exudates or hemorrhage  Neuro: Detailed Neurologic Exam  Speech:    Speech is normal; fluent and spontaneous with normal comprehension.  Cognition:     01/06/2022   12:26 PM 07/27/2017   11:25 AM 07/26/2016   10:46 AM  MMSE - Mini Mental State Exam  Orientation to time '4 5 5  '$ Orientation to Place '4 5 5  '$ Registration '3 3 3  '$ Attention/ Calculation 1 0 0  Recall '3 3 3  '$ Language- name 2 objects 2 0 0  Language- repeat '1 1 1  '$ Language- follow 3 step command '2 3 3  '$ Language- read & follow direction 1 0 0  Write a sentence 1 0 0  Copy design 0 0 0  Total score '22 20 20   '$ Cranial Nerves:    The pupils are equal, round, and reactive to light. Attempted pupils too small to visualize fundi. Left homonomous hemianopia. Extraocular movements are intact. Trigeminal sensation is intact and the muscles of mastication are normal. The face is symmetric. The palate elevates in the midline. Hearing intact. Voice is normal. Shoulder shrug is normal. The tongue has normal motion without fasciculations.   Coordination:    Normal   Gait:     Not ataxic.   Motor Observation:    No asymmetry, no atrophy, and no involuntary movements noted. Tone:    Normal muscle tone.    Posture:    Posture is normal. normal erect    Strength: Mild prox leg weakness but symmetrical and can be normal for age otherwise strength is V/V in the upper and lower limbs. She now has some mild left leg weakness after recent stroke.      Sensation: intact to LT     Reflex Exam:  DTR's: absent ajs. deep tendon reflexes in the upper and lower extremities are symmetrical  bilaterally.   Toes:    The toes are equiv bilaterally.   Clonus:    Clonus is  absent.  Gait: using a cane    Assessment/Plan:   Valerie Ramirez is a 88 y.o. female here as requested by Abner Greenspan, MD for multiple cryptogenic embolic strokes most recent strokes 01/06/2022 AND repeat 01/12/2022, memory problems and occipital nerulgia. PMHx acute embolic strokes, osteoarthritis, remote history of strokes, occipital neuralgia, history of subdural hematoma, cervical spinal fusion and chronic neck pain, hypertension, hyperlipidemia, CKD, CAD, dementia. I saw patient for strokes 01/06/2022 and now repeat embolic strokes 06/08/2540. -TEE is performed on 9/26, did not show any thrombosis.  However, patient had moderate atherosclerosis in the aortic arch and ascending aorta.  This could be the source of recurrent stroke vs afib.  Patient  has a pending ZIO recorder to monitor for atrial fibrillation and loop upcoming and sleep evaluation and second opinion with Dr. Leonie Man.  - She was admitted for slurred speech again 1/76/1607 had embolic strokes(last was 01/06/2022). She is wearing a heart monitor and has an appointment for loop recorder on the 31st at noon. TEE did not show any etiology. Differential includes cardioembolic strokes vs plaque from vessels. In the meantime she is on asa and plavix and then plavix alone. She still have some problems with speech no numbness or tingling or weakness. She has never had a sleep study.  Awaiting loop and also will get her seen for a sleep evaluation this coming Monday, spoke with Dr. Brett Fairy and she squeezed her in, much appreciate my wonderful colleague.   Also will ask Dr. Leonie Man for a one time consult, she has had 3 embolic episodes of strokes, cryptogenic, time to consider prophylactic eliquis? Will see what Dr. Leonie Man thinks and she can come back to me agterwrads. Again, appreciate my colleagues for their help.   - Repeat MRI 01/12/2022 showed repeat embolic phenomenon: Acute/subacute nonhemorrhagic infarct involving the anterior right thalamus. 8  mm acute/subacute nonhemorrhagic infarct of the left centrum semi ovale.Chronic encephalomalacia of the medial right occipital lobe and posteroinferior right parietal lobe. Advanced diffuse white matter disease bilaterally.Patient treated with aspirin Plavix, placed on telemetry.  - Multiple episodes embolic Strokes 3/71: I reviewed images and discussed with Dr. Erlinda Hong, head of stroke. Appear to be cardioembolic, ordered 06-YIR heart monitor and a loop recorder I suspect afib(pending currently). Sent referral to [facility] and Dr. Rayann Heman. In the meantime, continue plavix and asa, continue managing vascular risk factors like cholesterol, high blood pressure. We reviewed images together, here with daughter [name]. She has followed with ophthalmology. Blood pressure is elevated, need to follow up with Dr. Glori Bickers, check it first thing in the morning upon waling, mid day then in evening. LDL 155 goal is 70(recently improved now at goal with crestor). HgbA1c 6.2.   - Cervicalgia/occipital neuralgia. Neck pain and pain radiating to the back of the head. Every single day. Ongoing for years. acutely, severe, shooting electric pain. She has had cervical surgery in the past, Dr. Lorin Mercy C5-C7 ACDF with solid bony fusion and also multilevel degenerative changes. Discussed physical therapy, medications such as gabapentin or lyrica, voltaren gel (she has some), MRI cervical spine and consider injections such as ESi or medial branch blocks, occipital nerve blocks. At this time she declines options, she just wants to live with it. She has no pain today so hesitate to do occipital nerve bocks today but she can come back anytime. Will give voltaren gel and lidoderm patches. She declines anything else.   - Memory loss; More short term memory, repeating things, forgetting appointments. Daughter lives with her and helps her with every appointments, helps with medications. She can perform her own Adls but needs help with  IADLs (driving, going to the store and shopping, not driving). She takes B12 and in 2022 (reviewed labs was elevated). She takes vitamin d. No history of alcohol abuse or smoking. No FHx of dementia. No depression or anxiety. She is happy. She declines any further workup, may be normal cognitive aging, MCI, start aricept, will continue to follow.  MMSE 22/30.  the patient scored a 15 out of 30 on SLUMS.  Maybe due to due to vascular dementia. NO DRIVING, discussed and they agree  - Fasting lipid panel showed LDL 53, at  goal now, continue crestor  -continue aspirin, Plavix then Plavix alone    - speech therapy ordered  Orders Placed This Encounter  Procedures   Ambulatory referral to Sleep Studies   Ambulatory referral to Speech Therapy    Cc: Tower, Wynelle Fanny, MD,  Tower, Wynelle Fanny, MD  Sarina Ill, MD  [facility] [address] [address] [place] [address]  Phone [phone] Fax [phone]  I spent over 50 minutes of face-to-face and non-face-to-face time with patient on the  1. Acute embolic stroke ([place])   2. Dysarthria     diagnosis.  This included previsit chart review, lab review, study review, order entry, electronic health record documentation, patient education on the different diagnostic and therapeutic options, counseling and coordination of care, risks and benefits of management, compliance, or risk factor reduction

## 2022-02-01 ENCOUNTER — Encounter: Payer: Self-pay | Admitting: Neurology

## 2022-02-01 ENCOUNTER — Ambulatory Visit: Payer: Medicare Other | Admitting: Neurology

## 2022-02-01 VITALS — BP 197/108 | HR 98 | Ht 62.0 in | Wt 185.5 lb

## 2022-02-01 DIAGNOSIS — Z9861 Coronary angioplasty status: Secondary | ICD-10-CM

## 2022-02-01 DIAGNOSIS — G4719 Other hypersomnia: Secondary | ICD-10-CM | POA: Diagnosis not present

## 2022-02-01 DIAGNOSIS — I639 Cerebral infarction, unspecified: Secondary | ICD-10-CM | POA: Diagnosis not present

## 2022-02-01 DIAGNOSIS — I495 Sick sinus syndrome: Secondary | ICD-10-CM | POA: Diagnosis not present

## 2022-02-01 DIAGNOSIS — I25118 Atherosclerotic heart disease of native coronary artery with other forms of angina pectoris: Secondary | ICD-10-CM

## 2022-02-01 DIAGNOSIS — I2510 Atherosclerotic heart disease of native coronary artery without angina pectoris: Secondary | ICD-10-CM

## 2022-02-01 DIAGNOSIS — F01A0 Vascular dementia, mild, without behavioral disturbance, psychotic disturbance, mood disturbance, and anxiety: Secondary | ICD-10-CM

## 2022-02-01 NOTE — Patient Instructions (Signed)
Living With Sleep Apnea Sleep apnea is a condition in which breathing pauses or becomes shallow during sleep. Sleep apnea is most commonly caused by a collapsed or blocked airway. People with sleep apnea usually snore loudly. They may have times when they gasp and stop breathing for 10 seconds or more during sleep. This may happen many times during the night. The breaks in breathing also interrupt the deep sleep that you need to feel rested. Even if you do not completely wake up from the gaps in breathing, your sleep may not be restful and you feel tired during the day. You may also have a headache in the morning and low energy during the day, and you may feel anxious or depressed. How can sleep apnea affect me? Sleep apnea increases your chances of extreme tiredness during the day (daytime fatigue). It can also increase your risk for health conditions, such as: Heart attack. Stroke. Obesity. Type 2 diabetes. Heart failure. Irregular heartbeat. High blood pressure. If you have daytime fatigue as a result of sleep apnea, you may be more likely to: Perform poorly at school or work. Fall asleep while driving. Have difficulty with attention. Develop depression or anxiety. Have sexual dysfunction. What actions can I take to manage sleep apnea? Sleep apnea treatment  If you were given a device to open your airway while you sleep, use it only as told by your health care provider. You may be given: An oral appliance. This is a custom-made mouthpiece that shifts your lower jaw forward. A continuous positive airway pressure (CPAP) device. This device blows air through a mask when you breathe out (exhale). A nasal expiratory positive airway pressure (EPAP) device. This device has valves that you put into each nostril. A bi-level positive airway pressure (BIPAP) device. This device blows air through a mask when you breathe in (inhale) and breathe out (exhale). You may need surgery if other treatments  do not work for you. Sleep habits Go to sleep and wake up at the same time every day. This helps set your internal clock (circadian rhythm) for sleeping. If you stay up later than usual, such as on weekends, try to get up in the morning within 2 hours of your normal wake time. Try to get at least 7-9 hours of sleep each night. Stop using a computer, tablet, and mobile phone a few hours before bedtime. Do not take long naps during the day. If you nap, limit it to 30 minutes. Have a relaxing bedtime routine. Reading or listening to music may relax you and help you sleep. Use your bedroom only for sleep. Keep your television and computer out of your bedroom. Keep your bedroom cool, dark, and quiet. Use a supportive mattress and pillows. Follow your health care provider's instructions for other changes to sleep habits. Nutrition Do not eat heavy meals in the evening. Do not have caffeine in the later part of the day. The effects of caffeine can last for more than 5 hours. Follow your health care provider's or dietitian's instructions for any diet changes. Lifestyle     Do not drink alcohol before bedtime. Alcohol can cause you to fall asleep at first, but then it can cause you to wake up in the middle of the night and have trouble getting back to sleep. Do not use any products that contain nicotine or tobacco. These products include cigarettes, chewing tobacco, and vaping devices, such as e-cigarettes. If you need help quitting, ask your health care provider. Medicines Take   over-the-counter and prescription medicines only as told by your health care provider. Do not use over-the-counter sleep medicine. You can become dependent on this medicine, and it can make sleep apnea worse. Do not use medicines, such as sedatives and narcotics, unless told by your health care provider. Activity Exercise on most days, but avoid exercising in the evening. Exercising near bedtime can interfere with  sleeping. If possible, spend time outside every day. Natural light helps regulate your circadian rhythm. General information Lose weight if you need to, and maintain a healthy weight. Keep all follow-up visits. This is important. If you are having surgery, make sure to tell your health care provider that you have sleep apnea. You may need to bring your device with you. Where to find more information Learn more about sleep apnea and daytime fatigue from: American Sleep Association: sleepassociation.org National Sleep Foundation: sleepfoundation.org National Heart, Lung, and Blood Institute: nhlbi.nih.gov Summary Sleep apnea is a condition in which breathing pauses or becomes shallow during sleep. Sleep apnea can cause daytime fatigue and other serious health conditions. You may need to wear a device while sleeping to help keep your airway open. If you are having surgery, make sure to tell your health care provider that you have sleep apnea. You may need to bring your device with you. Making changes to sleep habits, diet, lifestyle, and activity can help you manage sleep apnea. This information is not intended to replace advice given to you by your health care provider. Make sure you discuss any questions you have with your health care provider. Document Revised: 11/12/2020 Document Reviewed: 03/14/2020 Elsevier Patient Education  2023 Elsevier Inc.  

## 2022-02-01 NOTE — Progress Notes (Signed)
SLEEP MEDICINE CLINIC    Provider:  Larey Seat, MD  Primary Care Physician:  Tower, Wynelle Fanny, MD [place] [address]     Referring Provider: Abner Greenspan, [place],  [place] [address]          Chief Complaint according to patient   Patient presents with:     New Patient (Initial Visit)     Pt in room #10 with her daughter, Pt here today for sleep consult post several stroke, for hypersomnia, high fatigue and daytime naps.       HISTORY OF PRESENT ILLNESS:  Valerie Ramirez is a 88 y.o. year old White or Caucasian female patient seen here as a referral on 02/01/2022 from Dr Jaynee Eagles for a sleep evaluation in light of embolic strokes.   02-01-2022: The patient forgot her hearing aid. .  Chief concern according to patient :  " I sleep much more, 10 hours of more"     Valerie Ramirez  has a past medical history of Arthritis, CAD (coronary artery disease), Cancer ([place]), GERD (gastroesophageal reflux disease), Hearing loss, History of kidney stones, Hyperlipidemia, Hypertension, Hypothyroid, and Shortness of breath dyspnea.   Sleep relevant medical history:  feeling tired at 9-10 pm, falls asleep in 30 minutes - wakes by 3 AAM and moves to her recliner- she has 2-3  nocturia, sleeps 1-3 hours in her recliner.  Sleeps only 5-6 hours at night, but naps in daytime. Nocturia/2-3, Sleep walking: none, no cervical spine injury, but  anterior fusion, deviated septum but no surgery.  Family medical /sleep history: daughter with OSA.   Social history:  Patient is retired from [employer] farm- and lives in a household with daughter. Family status is widowed , with 2 children, no grandchildren.  One cat and one dog.  Tobacco use; none.  ETOH use ; none,  Caffeine intake in form of Coffee( /) Soda( 2/day) Tea ( once a week ) or energy drinks. Regular exercise in form of: none.      Sleep habits are as follows: The patient's dinner time is  between 6-7 PM. The patient goes to bed at 9-12 PM and continues to sleep for 3 hours, wakes for 2-3 bathroom breaks, the first time at 2-3 AM.   The preferred sleep position is right lateral , with the support of 1 pillow- flat bed.   Dreams are reportedly rare.  8  AM is the usual rise time. The patient wakes up spontaneously.  She reports not feeling refreshed or restored in AM, with symptoms such as dry mouth, and residual fatigue. Naps are taken frequently, lasting from 60 to 180  minutes and are more refreshing than nocturnal sleep.    Dr Jaynee Eagles : multiple embolic cryptogenic strokes, occipital neuralgia, memory loss  01/26/2022; Valerie Ramirez is a 88 y.o. female with medical history significant of coronary artery disease, endometrial cancer, essential hypertension, dyslipidemia, hypothyroidism, and osteoarthritis who came to the hospital with complaints of 2 weeks history of slurred speech 5/88 with acute embolic stroke after JUST having a hospitalization 7 days prior for similar. I saw patient 01/06/2022 for stroke and since then she has had more embolic strokes 5/02 this will be her third. MRI showed: Acute/subacute nonhemorrhagic infarct involving the anterior right thalamus. 8 mm acute/subacute nonhemorrhagic infarct of the left centrum semi ovale.Chronic encephalomalacia of the medial right occipital lobe and posteroinferior right parietal lobe. Advanced  diffuse white matter disease bilaterally.Patient treated with aspirin Plavix, placed on telemetry.  Dr Jaynee Eagles: I saw patient for strokes 01/06/2022: She was again admitted for slurred speech 01/12/2022 with another episode of embolic strokes. She is wearing a heart monitor and has an appointment for loop recorder on the 31st at noon. TEE did not show any etiology. Differential includes cardioembolic strokes vs plaque from vessels. In the meantime she is on asa and plavix and then plavix alone. She still have some problems with speech no numbness  or tingling or weakness. She has never had a sleep study. TEE did not show any etiology. Awaiting loop and also will get her seen for a sleep evaluation this coming Monday,   Review of Systems: Out of a complete 14 system review, the patient complains of only the following symptoms, and all other reviewed systems are negative.:  Fatigue, sleepiness , snoring, fragmented sleep, Insomnia .  Vascular dementia. Dysphonia, dysphagia.   Hearing loss.  Bedroom is cool, quiet and dark.     How likely are you to doze in the following situations: 0 = not likely, 1 = slight chance, 2 = moderate chance, 3 = high chance   Sitting and Reading? Watching Television? Sitting inactive in a public place (theater or meeting)? As a passenger in a car for an hour without a break? Lying down in the afternoon when circumstances permit? Sitting and talking to someone? Sitting quietly after lunch without alcohol? In a car, while stopped for a few minutes in traffic?   Total = 16/ 24 points   FSS endorsed at 60/ 63 points.  GDS 3/ 15 points.   Social History   Socioeconomic History   Marital status: Widowed    Spouse name: Not on file   Number of children: 2   Years of education: Not on file   Highest education level: High school graduate  Occupational History   Not on file  Tobacco Use   Smoking status: Never   Smokeless tobacco: Never  Vaping Use   Vaping Use: Never used  Substance and Sexual Activity   Alcohol use: Never    Alcohol/week: 0.0 standard drinks of alcohol   Drug use: Never   Sexual activity: Never    Birth control/protection: Post-menopausal  Other Topics Concern   Not on file  Social History Narrative   Not on file   Social Determinants of Health   Financial Resource Strain: Low Risk  (07/16/2021)   Overall Financial Resource Strain (CARDIA)    Difficulty of Paying Living Expenses: Not hard at all  Food Insecurity: No Food Insecurity (01/09/2022)   Hunger Vital Sign     Worried About Running Out of Food in the Last Year: Never true    [place] in the Last Year: Never true  Transportation Needs: No Transportation Needs (01/09/2022)   PRAPARE - Hydrologist (Medical): No    Lack of Transportation (Non-Medical): No  Physical Activity: Insufficiently Active (07/16/2021)   Exercise Vital Sign    Days of Exercise per Week: 4 days    Minutes of Exercise per Session: 30 min  Stress: No Stress Concern Present (07/16/2021)   [employer]    Feeling of Stress : Not at all  Social Connections: Moderately Isolated (07/16/2021)   Social Connection and Isolation Panel [NHANES]    Frequency of Communication with Friends and Family: More than three times a week  Frequency of Social Gatherings with Friends and Family: Twice a week    Attends Religious Services: More than 4 times per year    Active Member of [employer] or Organizations: No    Attends Archivist Meetings: Never    Marital Status: Widowed    Family History  Problem Relation Age of Onset   Heart disease Father    Heart attack Father    Stroke Father    Heart disease Sister        CABG   Stroke Sister    Cancer Sister    Bladder Cancer Sister    Stroke Sister        "massive brain bleed"   Leukemia Brother    Lung cancer Brother    Leukemia Brother    Stomach cancer Brother    Heart disease Brother        CABG    Past Medical History:  Diagnosis Date   Arthritis    OA   CAD (coronary artery disease)    stent December 2018   Cancer [facility])    endometrial   GERD (gastroesophageal reflux disease)    Hearing loss    History of kidney stones    Hyperlipidemia    Hypertension    Hypothyroid    "not in years"   Shortness of breath dyspnea    04/25/17- not anymore    Past Surgical History:  Procedure Laterality Date   ABDOMINAL HYSTERECTOMY     ANTERIOR CERVICAL DECOMP/DISCECTOMY  FUSION [name] 08/18/2015   Procedure: C5-6, C6-7 Anterior Cervical Discectomy and Fusion, Allograft, Plate;  Surgeon: Marybelle Killings, MD;  Location: [facility];  Service: Orthopedics;  Laterality: [name];   bone spur removed Right    shoulder   BOWEL RESECTION  07/04/2012   Procedure: SMALL BOWEL RESECTION;  Surgeon: Alvino Chapel, MD;  Location: [facility];  Service: Gynecology;;   BREAST BIOPSY     CHOLECYSTECTOMY     CORONARY STENT INTERVENTION [name] 04/06/2017   Procedure: CORONARY STENT INTERVENTION;  Surgeon: Wellington Hampshire, MD;  Location: [facility];  Service: Cardiovascular;  Laterality: [name];   DILATION AND CURETTAGE OF UTERUS  1999   EYE SURGERY Bilateral    cataracts   FALSE ANEURYSM REPAIR Right 04/26/2017   Procedure: 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;  Surgeon: Conrad Hutchinson, MD;  Location: [facility];  Service: Vascular;  Laterality: Right;   HAND SURGERY  12/2008   after hand fx/due to fall   JOINT REPLACEMENT Bilateral 07/1998   knees   KNEE ARTHROPLASTY  05/23/1998   total right   LAPAROTOMY Bilateral 07/04/2012   Procedure: EXPLORATORY LAPAROTOMY TOTAL ABDOMINAL HYSTERECTOMY BILATERAL SALPINGO-OOPHORECTOMY with small bowel resection ;  Surgeon: Alvino Chapel, MD;  Location: [facility];  Service: Gynecology;  Laterality: Bilateral;   LEFT HEART CATH AND CORONARY ANGIOGRAPHY [name] 04/06/2017   Procedure: LEFT HEART CATH AND CORONARY ANGIOGRAPHY;  Surgeon: Jolaine Artist, MD;  Location: [facility];  Service: Cardiovascular;  Laterality: [name];   polyp removal  1999   TEE WITHOUT CARDIOVERSION [name] 01/12/2022   Procedure: TRANSESOPHAGEAL ECHOCARDIOGRAM (TEE);  Surgeon: Minna Merritts, MD;  Location: [facility];  Service: Cardiovascular;  Laterality: [name];   TUBAL LIGATION       Current Outpatient Medications on File Prior to Visit  Medication Sig Dispense Refill   aspirin EC 81 MG tablet Take 1 tablet (81 mg total) by mouth daily for 21  days. Swallow  whole. 21 tablet 0   Cholecalciferol (VITAMIN D-3) 25 MCG (1000 UT) CAPS Take 1,000 Units by mouth daily.     clopidogrel (PLAVIX) 75 MG tablet Take 1 tablet (75 mg total) by mouth daily. 90 tablet 3   Colchicine 0.6 MG CAPS Take 1 capsule by mouth in the morning and at bedtime. (Patient taking differently: Take 0.6 mg by mouth in the morning and at bedtime.) 60 capsule 1   Cyanocobalamin (VITAMIN B-12 PO) Take 1 tablet by mouth daily.     diclofenac Sodium (VOLTAREN ARTHRITIS PAIN) 1 % GEL Apply 4 g topically 4 (four) times daily. 100 g 11   donepezil (ARICEPT) 5 MG tablet Take 1 tablet (5 mg total) by mouth at bedtime. 30 tablet 6   DULoxetine (CYMBALTA) 30 MG capsule TAKE ONE CAPSULE BY MOUTH DAILY (Patient taking differently: Take 30 mg by mouth daily.) 90 capsule 3   FEROSUL 325 (65 Fe) MG tablet TAKE ONE TABLET BY MOUTH DAILY 90 tablet 1   lidocaine (LIDODERM) 5 % Place 1 patch onto the skin daily. Remove & Discard patch within 12 hours or as directed by MD 30 patch 11   Magnesium Chloride (MAG-SR PLUS CALCIUM PO) Take by mouth. Taking 1 tab once for dizziness     multivitamin-iron-minerals-folic acid (CENTRUM) chewable tablet Chew 1 tablet by mouth daily. 90 tablet 0   nitroGLYCERIN (NITROSTAT) 0.4 MG SL tablet Place 1 tablet (0.4 mg total) under the tongue every 5 (five) minutes x 3 doses as needed for chest pain. (Patient taking differently: Place 0.4 mg under the tongue every 5 (five) minutes as needed for chest pain.) 25 tablet 12   No current facility-administered medications on file prior to visit.    Allergies  Allergen Reactions   Allopurinol Nausea Only   Lipitor [Atorvastatin Calcium] Itching and Other (See Comments)    Leg aches and aching all over   Morphine And Related Other (See Comments)    Makes the patient "loopy,"    Simvastatin Other (See Comments)    MUSCLE PAIN   Tape Itching, Rash and Other (See Comments)    "Cannot use paper tape" --- also causes blisters. Use  plastic tape    Physical exam:  Today's Vitals   02/01/22 0918  BP: (!) 197/108  Pulse: 98  Weight: 185 lb 8 oz (84.1 kg)  Height: '5\' 2"'$  (1.575 m)   Body mass index is 33.93 kg/m.   Wt Readings from Last 3 Encounters:  02/01/22 185 lb 8 oz (84.1 kg)  01/26/22 186 lb 3.2 oz (84.5 kg)  01/12/22 183 lb 10.3 oz (83.3 kg)     Ht Readings from Last 3 Encounters:  02/01/22 '5\' 2"'$  (1.575 m)  01/26/22 '5\' 2"'$  (1.575 m)  01/12/22 '5\' 2"'$  (1.575 m)      General: The patient is awake, alert and appears not in acute distress.  The patient is well groomed.  Abdominal girth is evident.  Head: Normocephalic, atraumatic. Neck is supple. Mallampati 3,  neck circumference:15.5  inches . Nasal airflow patent.  nasal septum deviation  to the left. Retrognathia is not seen.  Dental status: full Cardiovascular:  Regular rate and cardiac rhythm by pulse,  without distended neck veins. Respiratory: Lungs are clear to auscultation.  Skin:  With evidence of ankle edema,. Trunk: The patient's posture is relaxed.    Neurologic exam : The patient is awake and alert, oriented to place and time.   Memory subjective described  as intact.  Attention span & concentration ability appears normal.  Speech  with  dysarthria, dysphonia , possibly aphasia.  Mood and affect are appropriate.   Cranial nerves: no loss of smell or taste reported  Pupils are equal and briskly reactive to light. Funduscopic exam deferred. .  Extraocular movements in vertical and horizontal planes were intact and without nystagmus. No Diplopia. Visual fields by finger perimetry are intact. Hearing was intact to soft voice and finger rubbing.    Facial sensation intact to fine touch.  Facial motor strength is symmetric and tongue and uvula move midline.  Neck ROM : rotation, tilt and flexion extension were normal for age and shoulder shrug was symmetrical.    Motor exam:  Symmetric bulk, tone and ROM.   Normal tone without cog  wheeling, symmetric grip strength .   Sensory:  Fine touch, pinprick and vibration were absent in both legs, patella and downwards.  Proprioception affected.   Coordination: Rapid alternating movements in the fingers/hands were of reduced  speed.  The Finger-to-nose maneuver was intact without evidence of ataxia, dysmetria or tremor.   Gait and station: Patient could rise but needed assistance from a seated position, walked with a cane  Toe and heel walk were deferred.  Deep tendon reflexes: in the  upper and lower extremities are symmetric and intact.  Babinski response was deferred .       After spending a total time of  35  minutes face to face and additional time for physical and neurologic examination, review of laboratory studies,  personal review of imaging studies, reports and results of other testing and review of referral information / records as far as provided in visit, I have established the following assessments:  1)  excessive daytime sleepiness since last 3 strokes, affecting the thalamic region of the brain. Not a snorer, but gasping or breath I her sleep. Very severely fatigued.  2)  patient has risk factors for OSA by Mallampati,  with dysphonia and dysphagia-and elevated BMI with fluid retention.Normal EF.  3) cryptogenic strokes, embolic strokes.    My Plan is to proceed with:  1) attended sleep study preferred, SPLIT at [facility] 10/h .  2) not a snorer, and considers herself a nasal breather, please try a nasal pillow or cradle first.  3)  keep a sleep journal.  Reduce naps to 30 minutes, go to bed before midnight.   I would like to thank Dr Jaynee Eagles  for allowing me to meet with and to take care of this pleasant patient.   In short, Valerie Ramirez is presenting with embolic strokes, 13 in total, hypersomnia, sleeping in a recliner-  question of sleep apnea.   I plan to follow up either personally or through our NP within 3-4 months.   CC: I will share my notes with  PCP and Dr Jaynee Eagles, MD> .  Electronically signed by: Larey Seat, MD 02/01/2022 9:31 AM  [facility] and [facility] Sleep Board certified by The [employer] of Sleep Medicine and Diplomate of the [employer] of Sleep Medicine. Board certified In Neurology through the [place], Fellow of the [employer] of Neurology. Medical Director of [employer].

## 2022-02-05 ENCOUNTER — Ambulatory Visit: Payer: Medicare Other | Admitting: Speech Pathology

## 2022-02-15 ENCOUNTER — Encounter: Payer: Self-pay | Admitting: Neurology

## 2022-02-15 DIAGNOSIS — G3184 Mild cognitive impairment, so stated: Secondary | ICD-10-CM

## 2022-02-15 DIAGNOSIS — I639 Cerebral infarction, unspecified: Secondary | ICD-10-CM

## 2022-02-15 DIAGNOSIS — R471 Dysarthria and anarthria: Secondary | ICD-10-CM

## 2022-02-16 ENCOUNTER — Ambulatory Visit: Payer: Medicare Other | Attending: Cardiovascular Disease | Admitting: Cardiovascular Disease

## 2022-02-16 ENCOUNTER — Encounter: Payer: Self-pay | Admitting: Cardiovascular Disease

## 2022-02-16 VITALS — BP 148/98 | HR 84 | Ht 62.0 in | Wt 183.0 lb

## 2022-02-16 DIAGNOSIS — R55 Syncope and collapse: Secondary | ICD-10-CM | POA: Diagnosis not present

## 2022-02-16 NOTE — Patient Instructions (Signed)
Medication Instructions:  Your physician recommends that you continue on your current medications as directed. Please refer to the Current Medication list given to you today.  *If you need a refill on your cardiac medications before your next appointment, please call your pharmacy*  Follow-Up: At [facility], you and your health needs are our priority.  As part of our continuing mission to provide you with exceptional heart care, we have created designated Provider Care Teams.  These Care Teams include your primary Cardiologist (physician) and Advanced Practice Providers (APPs -  Physician Assistants and Nurse Practitioners) who all work together to provide you with the care you need, when you need it.  Your next appointment:   3 weeks  The format for your next appointment:   In Person  Provider:    Melida Quitter, MD   Important Information About Sugar

## 2022-02-16 NOTE — Progress Notes (Signed)
Cardiology Office Note:    Date:  02/16/2022   ID:  Valerie Ramirez, DOB Jun 01, 1932, MRN [id number]  PCP:  Abner Greenspan, MD   [place] [facility] Cardiologist:  Peter Martinique, MD Electrophysiologist:  Melida Quitter, MD     Referring MD: Melvenia Beam, MD   Chief complaint: many strokes  History of Present Illness:    Valerie Ramirez is a 88 y.o. female with a hx of recurrent strokes referred for implantable loop recorder placement.  She is currently wearing a 30 day monitor that will be completed today.   She denies any symptoms of AF -- no palpitations, paroxysms of fatigue.   Past Medical History:  Diagnosis Date   Arthritis    OA   CAD (coronary artery disease)    stent December 2018   Cancer [facility])    endometrial   GERD (gastroesophageal reflux disease)    Hearing loss    History of kidney stones    Hyperlipidemia    Hypertension    Hypothyroid    "not in years"   Shortness of breath dyspnea    04/25/17- not anymore    Past Surgical History:  Procedure Laterality Date   ABDOMINAL HYSTERECTOMY     ANTERIOR CERVICAL DECOMP/DISCECTOMY FUSION [name] 08/18/2015   Procedure: C5-6, C6-7 Anterior Cervical Discectomy and Fusion, Allograft, Plate;  Surgeon: Marybelle Killings, MD;  Location: [place];  Service: Orthopedics;  Laterality: [name];   bone spur removed Right    shoulder   BOWEL RESECTION  07/04/2012   Procedure: SMALL BOWEL RESECTION;  Surgeon: Alvino Chapel, MD;  Location: [facility];  Service: Gynecology;;   BREAST BIOPSY     CHOLECYSTECTOMY     CORONARY STENT INTERVENTION [name] 04/06/2017   Procedure: CORONARY STENT INTERVENTION;  Surgeon: Wellington Hampshire, MD;  Location: [facility];  Service: Cardiovascular;  Laterality: [name];   DILATION AND CURETTAGE OF UTERUS  1999   EYE SURGERY Bilateral    cataracts   FALSE ANEURYSM REPAIR Right 04/26/2017   Procedure: 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;  Surgeon: Conrad [place], MD;   Location: [place];  Service: Vascular;  Laterality: Right;   HAND SURGERY  12/2008   after hand fx/due to fall   JOINT REPLACEMENT Bilateral 07/1998   knees   KNEE ARTHROPLASTY  05/23/1998   total right   LAPAROTOMY Bilateral 07/04/2012   Procedure: EXPLORATORY LAPAROTOMY TOTAL ABDOMINAL HYSTERECTOMY BILATERAL SALPINGO-OOPHORECTOMY with small bowel resection ;  Surgeon: Alvino Chapel, MD;  Location: [facility];  Service: Gynecology;  Laterality: Bilateral;   LEFT HEART CATH AND CORONARY ANGIOGRAPHY [name] 04/06/2017   Procedure: LEFT HEART CATH AND CORONARY ANGIOGRAPHY;  Surgeon: Jolaine Artist, MD;  Location: [facility] [facility];  Service: Cardiovascular;  Laterality: [name];   polyp removal  1999   TEE WITHOUT CARDIOVERSION [name] 01/12/2022   Procedure: TRANSESOPHAGEAL ECHOCARDIOGRAM (TEE);  Surgeon: Minna Merritts, MD;  Location: [facility];  Service: Cardiovascular;  Laterality: [name];   TUBAL LIGATION      Current Medications: Current Meds  Medication Sig   Cholecalciferol (VITAMIN D-3) 25 MCG (1000 UT) CAPS Take 1,000 Units by mouth daily.   clopidogrel (PLAVIX) 75 MG tablet Take 1 tablet (75 mg total) by mouth daily.   Colchicine 0.6 MG CAPS Take 1 capsule by mouth in the morning and at bedtime. (Patient taking differently: Take 0.6 mg by mouth in the morning and at bedtime.)  Cyanocobalamin (VITAMIN B-12 PO) Take 1 tablet by mouth daily.   diclofenac Sodium (VOLTAREN ARTHRITIS PAIN) 1 % GEL Apply 4 g topically 4 (four) times daily.   donepezil (ARICEPT) 5 MG tablet Take 1 tablet (5 mg total) by mouth at bedtime.   DULoxetine (CYMBALTA) 30 MG capsule TAKE ONE CAPSULE BY MOUTH DAILY (Patient taking differently: Take 30 mg by mouth daily.)   FEROSUL 325 (65 Fe) MG tablet TAKE ONE TABLET BY MOUTH DAILY   lidocaine (LIDODERM) 5 % Place 1 patch onto the skin daily. Remove & Discard patch within 12 hours or as directed by MD   Magnesium Chloride (MAG-SR PLUS CALCIUM PO) Take by mouth.  Taking 1 tab once for dizziness   multivitamin-iron-minerals-folic acid (CENTRUM) chewable tablet Chew 1 tablet by mouth daily.   nitroGLYCERIN (NITROSTAT) 0.4 MG SL tablet Place 1 tablet (0.4 mg total) under the tongue every 5 (five) minutes x 3 doses as needed for chest pain.     Allergies:   Allopurinol, Lipitor [atorvastatin calcium], Morphine and related, Simvastatin, and Tape   Social History   Socioeconomic History   Marital status: Widowed    Spouse name: Not on file   Number of children: 2   Years of education: Not on file   Highest education level: High school graduate  Occupational History   Not on file  Tobacco Use   Smoking status: Never   Smokeless tobacco: Never  Vaping Use   Vaping Use: Never used  Substance and Sexual Activity   Alcohol use: Never    Alcohol/week: 0.0 standard drinks of alcohol   Drug use: Never   Sexual activity: Never    Birth control/protection: Post-menopausal  Other Topics Concern   Not on file  Social History Narrative   Not on file   Social Determinants of Health   Financial Resource Strain: Low Risk  (07/16/2021)   Overall Financial Resource Strain (CARDIA)    Difficulty of Paying Living Expenses: Not hard at all  Food Insecurity: No Food Insecurity (01/09/2022)   Hunger Vital Sign    Worried About Running Out of Food in the Last Year: Never true    [place] in the Last Year: Never true  Transportation Needs: No Transportation Needs (01/09/2022)   PRAPARE - Hydrologist (Medical): No    Lack of Transportation (Non-Medical): No  Physical Activity: Insufficiently Active (07/16/2021)   Exercise Vital Sign    Days of Exercise per Week: 4 days    Minutes of Exercise per Session: 30 min  Stress: No Stress Concern Present (07/16/2021)   [place]    Feeling of Stress : Not at all  Social Connections: Moderately Isolated (07/16/2021)    Social Connection and Isolation Panel [NHANES]    Frequency of Communication with Friends and Family: More than three times a week    Frequency of Social Gatherings with Friends and Family: Twice a week    Attends Religious Services: More than 4 times per year    Active Member of [employer] or Organizations: No    Attends Archivist Meetings: Never    Marital Status: Widowed     Family History: The patient's family history includes Bladder Cancer in her sister; Cancer in her sister; Heart attack in her father; Heart disease in her brother, father, and sister; Leukemia in her brother and brother; Lung cancer in her brother; Stomach cancer in her  brother; Stroke in her father, sister, and sister.  ROS:   Please see the history of present illness.    All other systems reviewed and are negative.  EKGs/Labs/Other Studies Reviewed:      EKG:  Last EKG results: sinus rhythm    Recent Labs: 06/25/2021: TSH 2.25 11/14/2021: Magnesium 2.4 01/09/2022: ALT 16; BUN 29; Creatinine, Ser 1.51; Hemoglobin 12.5; Platelets 169; Potassium 4.6; Sodium 139      Physical Exam:    VS:  BP (!) 148/98   Pulse 84   Ht '5\' 2"'$  (1.575 m)   Wt 183 lb (83 kg)   SpO2 98%   BMI 33.47 kg/m     Wt Readings from Last 3 Encounters:  02/16/22 183 lb (83 kg)  02/01/22 185 lb 8 oz (84.1 kg)  01/26/22 186 lb 3.2 oz (84.5 kg)     GEN:  Well nourished, well developed in no acute distress CARDIAC: RRR, no murmurs, rubs, gallops RESPIRATORY:  Normal work of breathing MUSCULOSKELETAL: no edema    ASSESSMENT & PLAN:    Recurrent strokes, likely embolic: she is currently wearing a 30 day monitor. I need to confirm that this has not detected any AF prior to placing the loop monitor. She will submit the monitor today. I will schedule her to return to clinic in about 2 weeks to review monitor results and be prepared to place a loop recorder. I explained the procedure to her today and explained the risks which  include bleeding and infection.           Medication Adjustments/Labs and Tests Ordered: Current medicines are reviewed at length with the patient today.  Concerns regarding medicines are outlined above.  No orders of the defined types were placed in this encounter.  No orders of the defined types were placed in this encounter.    Signed, Melida Quitter, MD  02/16/2022 12:37 PM    [place]

## 2022-02-18 NOTE — Telephone Encounter (Signed)
Pt seen 01/06/22 by [facility] Dr Jaynee Eagles  Nothing further needed.

## 2022-02-18 NOTE — Progress Notes (Unsigned)
Dr. Myles Gip to read.

## 2022-02-22 ENCOUNTER — Telehealth: Payer: Self-pay | Admitting: Neurology

## 2022-02-22 NOTE — Telephone Encounter (Signed)
Spoke with Marjory Lies at [facility], he accepted pt and will reach out to her to set up.

## 2022-02-24 ENCOUNTER — Telehealth: Payer: Self-pay | Admitting: Family Medicine

## 2022-02-24 NOTE — Telephone Encounter (Signed)
LVM for pt to rtn my call to schedule AWV with [facility] call back # [phone] 

## 2022-02-26 DIAGNOSIS — F0153 Vascular dementia, unspecified severity, with mood disturbance: Secondary | ICD-10-CM | POA: Diagnosis not present

## 2022-02-26 DIAGNOSIS — H9191 Unspecified hearing loss, right ear: Secondary | ICD-10-CM | POA: Diagnosis not present

## 2022-02-26 DIAGNOSIS — I429 Cardiomyopathy, unspecified: Secondary | ICD-10-CM | POA: Diagnosis not present

## 2022-02-26 DIAGNOSIS — I129 Hypertensive chronic kidney disease with stage 1 through stage 4 chronic kidney disease, or unspecified chronic kidney disease: Secondary | ICD-10-CM | POA: Diagnosis not present

## 2022-02-26 DIAGNOSIS — I495 Sick sinus syndrome: Secondary | ICD-10-CM | POA: Diagnosis not present

## 2022-02-26 DIAGNOSIS — M47812 Spondylosis without myelopathy or radiculopathy, cervical region: Secondary | ICD-10-CM | POA: Diagnosis not present

## 2022-02-26 DIAGNOSIS — N1832 Chronic kidney disease, stage 3b: Secondary | ICD-10-CM | POA: Diagnosis not present

## 2022-02-26 DIAGNOSIS — H6123 Impacted cerumen, bilateral: Secondary | ICD-10-CM | POA: Diagnosis not present

## 2022-02-26 DIAGNOSIS — L820 Inflamed seborrheic keratosis: Secondary | ICD-10-CM | POA: Diagnosis not present

## 2022-02-26 DIAGNOSIS — F32A Depression, unspecified: Secondary | ICD-10-CM | POA: Diagnosis not present

## 2022-02-26 DIAGNOSIS — E7800 Pure hypercholesterolemia, unspecified: Secondary | ICD-10-CM | POA: Diagnosis not present

## 2022-02-26 DIAGNOSIS — I2510 Atherosclerotic heart disease of native coronary artery without angina pectoris: Secondary | ICD-10-CM | POA: Diagnosis not present

## 2022-02-26 DIAGNOSIS — M160 Bilateral primary osteoarthritis of hip: Secondary | ICD-10-CM | POA: Diagnosis not present

## 2022-02-26 DIAGNOSIS — E039 Hypothyroidism, unspecified: Secondary | ICD-10-CM | POA: Diagnosis not present

## 2022-02-26 DIAGNOSIS — M109 Gout, unspecified: Secondary | ICD-10-CM | POA: Diagnosis not present

## 2022-02-26 DIAGNOSIS — D72829 Elevated white blood cell count, unspecified: Secondary | ICD-10-CM | POA: Diagnosis not present

## 2022-02-27 ENCOUNTER — Other Ambulatory Visit: Payer: Self-pay | Admitting: Family Medicine

## 2022-03-01 ENCOUNTER — Telehealth: Payer: Self-pay | Admitting: Neurology

## 2022-03-01 NOTE — Telephone Encounter (Signed)
Called and LVM providing VO on behalf of Dr. Jaynee Eagles as requested. Asked them to call back if any further questions.

## 2022-03-01 NOTE — Telephone Encounter (Signed)
Last OV was a hospital f/u on 11/17/21, last filled on 02/24/21 #90 caps with 3 refills

## 2022-03-01 NOTE — Telephone Encounter (Signed)
Gennaro Africa from [facility] called needing VO for Home health Physical Therapy for:   1 x 1 week  2 x 1 week  1 x 1 week  2 x 2 weeks 1 x 4 weeks

## 2022-03-02 ENCOUNTER — Encounter: Payer: Self-pay | Admitting: Neurology

## 2022-03-02 ENCOUNTER — Ambulatory Visit: Payer: Medicare Other | Admitting: Neurology

## 2022-03-02 VITALS — BP 209/96 | HR 81 | Ht 61.0 in | Wt 180.8 lb

## 2022-03-02 DIAGNOSIS — Z8673 Personal history of transient ischemic attack (TIA), and cerebral infarction without residual deficits: Secondary | ICD-10-CM

## 2022-03-02 DIAGNOSIS — I639 Cerebral infarction, unspecified: Secondary | ICD-10-CM | POA: Diagnosis not present

## 2022-03-02 DIAGNOSIS — G3184 Mild cognitive impairment, so stated: Secondary | ICD-10-CM

## 2022-03-02 MED ORDER — DONEPEZIL HCL 5 MG PO TABS: 10.0000 mg | ORAL_TABLET | Freq: Every day | ORAL | 6 refills | Status: DC

## 2022-03-02 NOTE — Patient Instructions (Addendum)
I had a long discussion with patient and her daughter regarding her history of multiple strokes and mild memory loss and cognitive impairment with questions.  Continue Plavix for stroke prevention and maintain aggressive risk factor modification with strict control of hypertension with blood pressure goal below 130/90, lipids with LDL cholesterol goal below 70 mg percent and diabetes with hemoglobin A1c goal below 6.5%.  Keep up scheduled appointment for loop recorder insertion later this week.  Continue Aricept but increase dose to 10 mg daily for her cognitive impairment and increase participation in cognitively challenging activities like solving crossword puzzles, playing bridge, sudoku and word searches.  We also discussed memory compensation strategies.  Continue follow-up with Dr. Jaynee Eagles for management of her headaches.  Follow-up with me in the future only if necessary..  Stroke Prevention Some medical conditions and behaviors can lead to a higher chance of having a stroke. You can help prevent a stroke by eating healthy, exercising, not smoking, and managing any medical conditions you have. Stroke is a leading cause of functional impairment. Primary prevention is particularly important because a majority of strokes are first-time events. Stroke changes the lives of not only those who experience a stroke but also their family and other caregivers. How can this condition affect me? A stroke is a medical emergency and should be treated right away. A stroke can lead to brain damage and can sometimes be life-threatening. If a person gets medical treatment right away, there is a better chance of surviving and recovering from a stroke. What can increase my risk? The following medical conditions may increase your risk of a stroke: Cardiovascular disease. High blood pressure (hypertension). Diabetes. High cholesterol. Sickle cell disease. Blood clotting disorders (hypercoagulable  state). Obesity. Sleep disorders (obstructive sleep apnea). Other risk factors include: Being older than age 40. Having a history of blood clots, stroke, or mini-stroke (transient ischemic attack, TIA). Genetic factors, such as race, ethnicity, or a family history of stroke. Smoking cigarettes or using other tobacco products. Taking birth control pills, especially if you also use tobacco. Heavy use of alcohol or drugs, especially cocaine and methamphetamine. Physical inactivity. What actions can I take to prevent this? Manage your health conditions High cholesterol levels. Eating a healthy diet is important for preventing high cholesterol. If cholesterol cannot be managed through diet alone, you may need to take medicines. Take any prescribed medicines to control your cholesterol as told by your health care provider. Hypertension. To reduce your risk of stroke, try to keep your blood pressure below 130/80. Eating a healthy diet and exercising regularly are important for controlling blood pressure. If these steps are not enough to manage your blood pressure, you may need to take medicines. Take any prescribed medicines to control hypertension as told by your health care provider. Ask your health care provider if you should monitor your blood pressure at home. Have your blood pressure checked every year, even if your blood pressure is normal. Blood pressure increases with age and some medical conditions. Diabetes. Eating a healthy diet and exercising regularly are important parts of managing your blood sugar (glucose). If your blood sugar cannot be managed through diet and exercise, you may need to take medicines. Take any prescribed medicines to control your diabetes as told by your health care provider. Get evaluated for obstructive sleep apnea. Talk to your health care provider about getting a sleep evaluation if you snore a lot or have excessive sleepiness. Make sure that any other  medical conditions you  have, such as atrial fibrillation or atherosclerosis, are managed. Nutrition Follow instructions from your health care provider about what to eat or drink to help manage your health condition. These instructions may include: Reducing your daily calorie intake. Limiting how much salt (sodium) you use to 1,500 milligrams (mg) each day. Using only healthy fats for cooking, such as olive oil, canola oil, or sunflower oil. Eating healthy foods. You can do this by: Choosing foods that are high in fiber, such as whole grains, and fresh fruits and vegetables. Eating at least 5 servings of fruits and vegetables a day. Try to fill one-half of your plate with fruits and vegetables at each meal. Choosing lean protein foods, such as lean cuts of meat, poultry without skin, fish, tofu, beans, and nuts. Eating low-fat dairy products. Avoiding foods that are high in sodium. This can help lower blood pressure. Avoiding foods that have saturated fat, trans fat, and cholesterol. This can help prevent high cholesterol. Avoiding processed and prepared foods. Counting your daily carbohydrate intake.  Lifestyle If you drink alcohol: Limit how much you have to: 0-1 drink a day for women who are not pregnant. 0-2 drinks a day for men. Know how much alcohol is in your drink. In the [place] one drink equals one 12 oz bottle of beer (352m), one 5 oz glass of wine (1452m, or one 1 oz glass of hard liquor (4410m Do not use any products that contain nicotine or tobacco. These products include cigarettes, chewing tobacco, and vaping devices, such as e-cigarettes. If you need help quitting, ask your health care provider. Avoid secondhand smoke. Do not use drugs. Activity  Try to stay at a healthy weight. Get at least 30 minutes of exercise on most days, such as: Fast walking. Biking. Swimming. Medicines Take over-the-counter and prescription medicines only as told by your health care  provider. Aspirin or blood thinners (antiplatelets or anticoagulants) may be recommended to reduce your risk of forming blood clots that can lead to stroke. Avoid taking birth control pills. Talk to your health care provider about the risks of taking birth control pills if: You are over 35 36ars old. You smoke. You get very bad headaches. You have had a blood clot. Where to find more information American Stroke Association: www.strokeassociation.org Get help right away if: You or a loved one has any symptoms of a stroke. "BE FAST" is an easy way to remember the main warning signs of a stroke: B - Balance. Signs are dizziness, sudden trouble walking, or loss of balance. E - Eyes. Signs are trouble seeing or a sudden change in vision. F - Face. Signs are sudden weakness or numbness of the face, or the face or eyelid drooping on one side. A - Arms. Signs are weakness or numbness in an arm. This happens suddenly and usually on one side of the body. S - Speech. Signs are sudden trouble speaking, slurred speech, or trouble understanding what people say. T - Time. Time to call emergency services. Write down what time symptoms started. You or a loved one has other signs of a stroke, such as: A sudden, severe headache with no known cause. Nausea or vomiting. Seizure. These symptoms may represent a serious problem that is an emergency. Do not wait to see if the symptoms will go away. Get medical help right away. Call your local emergency services (911 in the [place] Do not drive yourself to the hospital. Summary You can help to prevent a stroke by eating healthy,  exercising, not smoking, limiting alcohol intake, and managing any medical conditions you may have. Do not use any products that contain nicotine or tobacco. These include cigarettes, chewing tobacco, and vaping devices, such as e-cigarettes. If you need help quitting, ask your health care provider. Remember "BE FAST" for warning signs of a  stroke. Get help right away if you or a loved one has any of these signs. This information is not intended to replace advice given to you by your health care provider. Make sure you discuss any questions you have with your health care provider. Document Revised: 11/05/2019 Document Reviewed: 11/05/2019 Elsevier Patient Education  [place].

## 2022-03-02 NOTE — Progress Notes (Signed)
[facility] [address] [place]. [place] [address] [phone]       OFFICE Consult NOTE  [name] Date of Birth:  March 28, 1933 Medical Record Number:  [id number]   REF MD : Dr Valerie Ramirez Reason for consult : strokes  HPI: [name] is a 88 year old pleasant Caucasian lady seen today for initial office consultation visit for stroke.  She is accompanied by her [name].  History is obtained from them and review of electronic medical records.  I have personally reviewed pertinent available imaging films in PACS.  She has past medical history of hypertension, hyperlipidemia, coronary artery disease s/p stents, remote endometrial cancer, kidney stones, prior left PCA stroke who was recently admitted in July 2023.  Multifocal embolic strokes cryptogenic etiology in July 2023 with negative neurovascular work-up as well as another admission 01/09/22 for altered mental status and slurred speech and found to have small right anterior thalamic and left coronary radiator lacunar infarcts.  MRI at that time also showed right occipital posterior parietal occipital infarct of remote age.  She underwent outpatient 30-day heart monitor from 01/17/2022 through 02/2022 which was negative for cardiac arrhythmia and is now scheduled to undergo loop recorder insertion later this week by Dr.Mesner cardiac electrophysiologist.  Transthoracic echo showed ejection fraction of 60 to 65% and TEE on 01/12/2022 showed no cardiac source of embolism, PFO or clot.  He is currently on Plavix 75 mg daily which is tolerating well without bleeding and only minor bruising.  LDL cholesterol on 01/06/2022 was 155 mg percent.  Hemoglobin A1c on 11/14/2021 was borderline at 6.2.  MR angiogram of the brain on 11/14/2021 showed moderate mid right MCA M1, moderate to severe bilateral PCA P2 and severe left PCA P3 stenosis. She has also noticed mild memory and cognitive difficulties for the last year or so.  She saw  Dr. Gretta Ramirez started on Aricept 5 mg which she is tolerating well without any GI or CNS side effects.  The daughter feels that her cognitive difficulties are unchanged and not progressing She also has chronic cervicogenic headaches for which she is being managed by Dr Valerie Ramirez.  She has seen Dr. Jennings Ramirez in [place] in the past but now prefers her care here in [place].  ROS:   14 system review of systems is positive for memory loss, and, bruising, hearing loss and all other systems negative  PMH:  Past Medical History:  Diagnosis Date   Arthritis    OA   CAD (coronary artery disease)    stent December 2018   Cancer [facility])    endometrial   GERD (gastroesophageal reflux disease)    Hearing loss    History of kidney stones    Hyperlipidemia    Hypertension    Hypothyroid    "not in years"   Shortness of breath dyspnea    04/25/17- not anymore    Social History:  Social History   Socioeconomic History   Marital status: Widowed    Spouse name: Not on file   Number of children: 2   Years of education: Not on file   Highest education level: High school graduate  Occupational History   Not on file  Tobacco Use   Smoking status: Never   Smokeless tobacco: Never  Vaping Use   Vaping Use: Never used  Substance and Sexual Activity   Alcohol use: Never    Alcohol/week: 0.0 standard drinks of alcohol   Drug use: Never   Sexual activity:  Never    Birth control/protection: Post-menopausal  Other Topics Concern   Not on file  Social History Narrative   Not on file   Social Determinants of Health   Financial Resource Strain: Low Risk  (07/16/2021)   Overall Financial Resource Strain (CARDIA)    Difficulty of Paying Living Expenses: Not hard at all  Food Insecurity: No Food Insecurity (01/09/2022)   Hunger Vital Sign    Worried About Running Out of Food in the Last Year: Never true    Ran Out of Food in the Last Year: Never true  Transportation Needs: No Transportation Needs  (01/09/2022)   PRAPARE - Hydrologist (Medical): No    Lack of Transportation (Non-Medical): No  Physical Activity: Insufficiently Active (07/16/2021)   Exercise Vital Sign    Days of Exercise per Week: 4 days    Minutes of Exercise per Session: 30 min  Stress: No Stress Concern Present (07/16/2021)   [place]    Feeling of Stress : Not at all  Social Connections: Moderately Isolated (07/16/2021)   Social Connection and Isolation Panel [NHANES]    Frequency of Communication with Friends and Family: More than three times a week    Frequency of Social Gatherings with Friends and Family: Twice a week    Attends Religious Services: More than 4 times per year    Active Member of [employer] or Organizations: No    Attends Archivist Meetings: Never    Marital Status: Widowed  Intimate Partner Violence: Not At Risk (01/09/2022)   Humiliation, Afraid, Rape, and Kick questionnaire    Fear of Current or Ex-Partner: No    Emotionally Abused: No    Physically Abused: No    Sexually Abused: No    Medications:   Current Outpatient Medications on File Prior to Visit  Medication Sig Dispense Refill   Cholecalciferol (VITAMIN D-3) 25 MCG (1000 UT) CAPS Take 1,000 Units by mouth daily.     clopidogrel (PLAVIX) 75 MG tablet Take 1 tablet (75 mg total) by mouth daily. 90 tablet 3   Colchicine 0.6 MG CAPS Take 1 capsule by mouth in the morning and at bedtime. (Patient taking differently: Take 0.6 mg by mouth in the morning and at bedtime.) 60 capsule 1   Cyanocobalamin (VITAMIN B-12 PO) Take 1 tablet by mouth daily.     diclofenac Sodium (VOLTAREN ARTHRITIS PAIN) 1 % GEL Apply 4 g topically 4 (four) times daily. 100 g 11   donepezil (ARICEPT) 5 MG tablet Take 1 tablet (5 mg total) by mouth at bedtime. 30 tablet 6   DULoxetine (CYMBALTA) 30 MG capsule TAKE ONE CAPSULE BY MOUTH DAILY 90 capsule 3   FEROSUL  325 (65 Fe) MG tablet TAKE ONE TABLET BY MOUTH DAILY 90 tablet 1   lidocaine (LIDODERM) 5 % Place 1 patch onto the skin daily. Remove & Discard patch within 12 hours or as directed by MD 30 patch 11   Magnesium Chloride (MAG-SR PLUS CALCIUM PO) Take by mouth. Taking 1 tab once for dizziness     multivitamin-iron-minerals-folic acid (CENTRUM) chewable tablet Chew 1 tablet by mouth daily. 90 tablet 0   nitroGLYCERIN (NITROSTAT) 0.4 MG SL tablet Place 1 tablet (0.4 mg total) under the tongue every 5 (five) minutes x 3 doses as needed for chest pain. (Patient not taking: Reported on 03/02/2022) 25 tablet 12   No current facility-administered medications on file prior  to visit.    Allergies:   Allergies  Allergen Reactions   Allopurinol Nausea Only   Lipitor [Atorvastatin Calcium] Itching and Other (See Comments)    Leg aches and aching all over   Morphine And Related Other (See Comments)    Makes the patient "loopy,"    Simvastatin Other (See Comments)    MUSCLE PAIN   Tape Itching, Rash and Other (See Comments)    "Cannot use paper tape" --- also causes blisters. Use plastic tape    Physical Exam General: well developed, well nourished pleasant elderly Caucasian lady, seated, in no evident distress Head: head normocephalic and atraumatic.  Neck: supple with no carotid or supraclavicular bruits Cardiovascular: regular rate and rhythm, no murmurs Musculoskeletal: no deformity Skin:  no rash/petichiae Vascular:  Normal pulses all extremities Vitals:   03/02/22 1327 03/02/22 1331  BP: (!) 218/102 (!) 209/96  Pulse: 77 81   Neurologic Exam Mental Status: Awake and fully alert. Oriented to place and time. Recent and remote memory intact. Attention span, concentration and fund of knowledge appropriate. Mood and affect appropriate.  Diminished recall 2/3.  Able to name 12: Animals which can walk on 4 legs.. clock drawing 3/4.  Mini-Mental status exam scored 24/30 which is likely better than  last visit and she scored 22/30 Cranial Nerves: Fundoscopic exam reveals sharp disc margins. Pupils equal, briskly reactive to light. Extraocular movements full without nystagmus. Visual fields difficult to assess as patient's lack of cooperation. Hearing mildly diminished bilaterally facial sensation intact. Face, tongue, palate moves normally and symmetrically.  Motor: Normal bulk and tone. Normal strength in all tested extremity muscles. Sensory.: intact to touch ,pinprick .position and vibratory sensation.  Coordination: Rapid alternating movements normal in all extremities. Finger-to-nose and heel-to-shin performed accurately bilaterally. Gait and Station: Arises from chair without difficulty. Stance is normal. Gait demonstrates normal stride length and slight I'm balance .  Unable to do tandem walking without difficulty.   Reflexes: 1+ and symmetric. Toes downgoing.   NIHSS  1 Modified Rankin  1     03/02/2022    2:18 PM 01/06/2022   12:26 PM 07/27/2017   11:25 AM 07/26/2016   10:46 AM  MMSE - Mini Mental State Exam  Orientation to time '5 4 5 5  '$ Orientation to Place '4 4 5 5  '$ Registration '3 3 3 3  '$ Attention/ Calculation 1 1 0 0  Recall '3 3 3 3  '$ Language- name 2 objects 2 2 0 0  Language- repeat 0 '1 1 1  '$ Language- follow 3 step command '3 2 3 3  '$ Language- read & follow direction 1 1 0 0  Write a sentence 1 1 0 0  Copy design 1 0 0 0  Total score '24 22 20 20     '$ ASSESSMENT: 88 year old Caucasian lady with history of multiple cortical and subcortical infarcts likely due to combination of intracranial atherosclerosis and small vessel disease and possibly occult A-fib.  Vascular risk factors of hypertension, hyperlipidemia,CAD and at risk for sleep apnoea and age.  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  Chronic headaches being managed by Dr. Jaynee Ramirez.     PLAN:I had a long discussion with  patient and her daughter regarding her history of multiple strokes and mild memory loss and cognitive impairment with questions.  Continue Plavix for stroke prevention and maintain aggressive risk factor modification with strict control of hypertension  with blood pressure goal below 130/90, lipids with LDL cholesterol goal below 70 mg percent and diabetes with hemoglobin A1c goal below 6.5%.  Keep up scheduled appointment for loop recorder insertion later this week.  Continue Aricept but increase dose to 10 mg daily for her cognitive impairment and increase participation in cognitively challenging activities like solving crossword puzzles, playing bridge, sudoku and word searches.  We also discussed memory compensation strategies.  Continue follow-up with Dr. Jaynee Ramirez for management of her headaches.  Follow-up with me in the future only if necessary.. Greater than 50% of time during this 45 minute visit was spent on counseling,explanation of diagnosis, planning of further management, discussion with patient and family and coordination of care Antony Contras, MD Note: This document was prepared with digital dictation and possible smart phrase technology. Any transcriptional errors that result from this process are unintentional

## 2022-03-04 ENCOUNTER — Encounter: Payer: Self-pay | Admitting: Cardiovascular Disease

## 2022-03-04 ENCOUNTER — Ambulatory Visit: Payer: Medicare Other | Attending: Cardiovascular Disease | Admitting: Cardiovascular Disease

## 2022-03-04 VITALS — BP 144/90 | HR 83 | Ht 61.0 in | Wt 184.8 lb

## 2022-03-04 DIAGNOSIS — I639 Cerebral infarction, unspecified: Secondary | ICD-10-CM

## 2022-03-04 LAB — DEMENTIA PANEL
Homocysteine: 12.2 umol/L (ref 0.0–21.3)
RPR Ser Ql: NONREACTIVE
TSH: 1.69 u[IU]/mL (ref 0.450–4.500)
Vitamin B-12: >2000 pg/mL — ABNORMAL HIGH (ref 232–1245)

## 2022-03-04 NOTE — Progress Notes (Signed)
Electrophysiology Office Note:    Date:  03/04/2022   ID:  Valerie Ramirez, DOB February 28, 1933, MRN [id number]  PCP:  Abner Greenspan, MD   [place] Providers Cardiologist:  Peter Martinique, MD Electrophysiologist:  Melida Quitter, MD     Referring MD: Abner Greenspan, MD   Chief complaint: I need a loop recorder.  History of Present Illness:    Valerie Ramirez is a 88 y.o. female with a hx of HTN, HLD, multifocal and recurrent strokes.  She was referred for a loop recorder implant. She wore a monitor after her stroke. This did appear to show some brief atrial runs but no definitive sustained AF.  She has no complaints today - no palpitations, chest pain, dyspnea.    Past Medical History:  Diagnosis Date   Arthritis    OA   CAD (coronary artery disease)    stent December 2018   Cancer [facility])    endometrial   GERD (gastroesophageal reflux disease)    Hearing loss    History of kidney stones    Hyperlipidemia    Hypertension    Hypothyroid    "not in years"   Shortness of breath dyspnea    04/25/17- not anymore    Past Surgical History:  Procedure Laterality Date   ABDOMINAL HYSTERECTOMY     ANTERIOR CERVICAL DECOMP/DISCECTOMY FUSION [name] 08/18/2015   Procedure: C5-6, C6-7 Anterior Cervical Discectomy and Fusion, Allograft, Plate;  Surgeon: Marybelle Killings, MD;  Location: [place];  Service: Orthopedics;  Laterality: [name];   bone spur removed Right    shoulder   BOWEL RESECTION  07/04/2012   Procedure: SMALL BOWEL RESECTION;  Surgeon: Alvino Chapel, MD;  Location: [facility];  Service: Gynecology;;   BREAST BIOPSY     CHOLECYSTECTOMY     CORONARY STENT INTERVENTION [name] 04/06/2017   Procedure: CORONARY STENT INTERVENTION;  Surgeon: Wellington Hampshire, MD;  Location: [facility];  Service: Cardiovascular;  Laterality: [name];   DILATION AND CURETTAGE OF UTERUS  1999   EYE SURGERY Bilateral    cataracts   FALSE ANEURYSM REPAIR Right 04/26/2017   Procedure:  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;  Surgeon: Conrad [place], MD;  Location: [place];  Service: Vascular;  Laterality: Right;   HAND SURGERY  12/2008   after hand fx/due to fall   JOINT REPLACEMENT Bilateral 07/1998   knees   KNEE ARTHROPLASTY  05/23/1998   total right   LAPAROTOMY Bilateral 07/04/2012   Procedure: EXPLORATORY LAPAROTOMY TOTAL ABDOMINAL HYSTERECTOMY BILATERAL SALPINGO-OOPHORECTOMY with small bowel resection ;  Surgeon: Alvino Chapel, MD;  Location: [facility];  Service: Gynecology;  Laterality: Bilateral;   LEFT HEART CATH AND CORONARY ANGIOGRAPHY [name] 04/06/2017   Procedure: LEFT HEART CATH AND CORONARY ANGIOGRAPHY;  Surgeon: Jolaine Artist, MD;  Location: [place] [facility];  Service: Cardiovascular;  Laterality: [name];   polyp removal  1999   TEE WITHOUT CARDIOVERSION [name] 01/12/2022   Procedure: TRANSESOPHAGEAL ECHOCARDIOGRAM (TEE);  Surgeon: Minna Merritts, MD;  Location: [facility];  Service: Cardiovascular;  Laterality: [name];   TUBAL LIGATION      Current Medications: Current Meds  Medication Sig   Cholecalciferol (VITAMIN D-3) 25 MCG (1000 UT) CAPS Take 1,000 Units by mouth daily.   clopidogrel (PLAVIX) 75 MG tablet Take 1 tablet (75 mg total) by mouth daily.   Colchicine 0.6 MG CAPS Take 1 capsule by mouth in the morning and at  bedtime. (Patient taking differently: Take 0.6 mg by mouth in the morning and at bedtime.)   Cyanocobalamin (VITAMIN B-12 PO) Take 1 tablet by mouth daily.   diclofenac Sodium (VOLTAREN ARTHRITIS PAIN) 1 % GEL Apply 4 g topically 4 (four) times daily.   donepezil (ARICEPT) 5 MG tablet Take 2 tablets (10 mg total) by mouth at bedtime.   DULoxetine (CYMBALTA) 30 MG capsule TAKE ONE CAPSULE BY MOUTH DAILY   FEROSUL 325 (65 Fe) MG tablet TAKE ONE TABLET BY MOUTH DAILY   lidocaine (LIDODERM) 5 % Place 1 patch onto the skin daily. Remove & Discard patch within 12 hours or as directed by MD   Magnesium Chloride (MAG-SR PLUS CALCIUM  PO) Take by mouth. Taking 1 tab once for dizziness   multivitamin-iron-minerals-folic acid (CENTRUM) chewable tablet Chew 1 tablet by mouth daily.   nitroGLYCERIN (NITROSTAT) 0.4 MG SL tablet Place 1 tablet (0.4 mg total) under the tongue every 5 (five) minutes x 3 doses as needed for chest pain.     Allergies:   Allopurinol, Lipitor [atorvastatin calcium], Morphine and related, Simvastatin, and Tape   Social History   Socioeconomic History   Marital status: Widowed    Spouse name: Not on file   Number of children: 2   Years of education: Not on file   Highest education level: High school graduate  Occupational History   Not on file  Tobacco Use   Smoking status: Never   Smokeless tobacco: Never  Vaping Use   Vaping Use: Never used  Substance and Sexual Activity   Alcohol use: Never    Alcohol/week: 0.0 standard drinks of alcohol   Drug use: Never   Sexual activity: Never    Birth control/protection: Post-menopausal  Other Topics Concern   Not on file  Social History Narrative   Not on file   Social Determinants of Health   Financial Resource Strain: Low Risk  (07/16/2021)   Overall Financial Resource Strain (CARDIA)    Difficulty of Paying Living Expenses: Not hard at all  Food Insecurity: No Food Insecurity (01/09/2022)   Hunger Vital Sign    Worried About Running Out of Food in the Last Year: Never true    [place] in the Last Year: Never true  Transportation Needs: No Transportation Needs (01/09/2022)   PRAPARE - Hydrologist (Medical): No    Lack of Transportation (Non-Medical): No  Physical Activity: Insufficiently Active (07/16/2021)   Exercise Vital Sign    Days of Exercise per Week: 4 days    Minutes of Exercise per Session: 30 min  Stress: No Stress Concern Present (07/16/2021)   [place]    Feeling of Stress : Not at all  Social Connections: Moderately  Isolated (07/16/2021)   Social Connection and Isolation Panel [NHANES]    Frequency of Communication with Friends and Family: More than three times a week    Frequency of Social Gatherings with Friends and Family: Twice a week    Attends Religious Services: More than 4 times per year    Active Member of [employer] or Organizations: No    Attends Archivist Meetings: Never    Marital Status: Widowed     Family History: The patient's family history includes Bladder Cancer in her sister; Cancer in her sister; Heart attack in her father; Heart disease in her brother, father, and sister; Leukemia in her brother and brother; Lung  cancer in her brother; Stomach cancer in her brother; Stroke in her father, sister, and sister.  ROS:   Please see the history of present illness.    All other systems reviewed and are negative.  EKGs/Labs/Other Studies Reviewed Today:     Cardiac rhythm monitor: showed PACs and brief, nonsustained atrial runs but no definitive AF  EKG:  Last EKG results: today - sinus rhythm with PACs   Recent Labs: 11/14/2021: Magnesium 2.4 01/09/2022: ALT 16; BUN 29; Creatinine, Ser 1.51; Hemoglobin 12.5; Platelets 169; Potassium 4.6; Sodium 139 03/02/2022: TSH 1.690     Physical Exam:    VS:  BP (!) 144/90   Pulse 83   Ht '5\' 1"'$  (1.549 m)   Wt 184 lb 12.8 oz (83.8 kg)   SpO2 97%   BMI 34.92 kg/m     Wt Readings from Last 3 Encounters:  03/04/22 184 lb 12.8 oz (83.8 kg)  03/02/22 180 lb 12.8 oz (82 kg)  02/16/22 183 lb (83 kg)     GEN: Well nourished, well developed in no acute distress CARDIAC: RRR, no murmurs, rubs, gallops RESPIRATORY:  Normal work of breathing MUSCULOSKELETAL: no edema    ASSESSMENT & PLAN:    Stroke: suspected embolic from cardiac source. Loop recorder has been requested. I explained the rationale and logistics of the procedure to the patient. Risks include infection and bleeding. She would like to proceed.       DESCRIPTION  OF PROCEDURE:  Informed written consent was obtained.  The patient required no sedation for the procedure today.  Mapping over the patient's chest was performed to identify the area where electrograms were most prominent for ILR recording.  This area was found to be the left parasternal region over the 4th intercostal space. The patients left chest was prepped and draped in the usual sterile fashion. The skin overlying the left parasternal region was infiltrated with lidocaine for local analgesia.  A 0.5-cm incision was made over the left parasternal region over the 3rd intercostal space.  An Abbott implantable loop recorder was then placed into the pocket  R waves were very prominent and measured >0.54m.  Steri- Strips and a sterile dressing were then applied.  There were no early apparent complications.     CONCLUSIONS:   1. Successful implantation of an Abbott implantable loop recorder for a history of cryptogenic stroke  2. No early apparent complications.   AMelida Quitter MD  Cardiac Electrophysiology         Medication Adjustments/Labs and Tests Ordered: Current medicines are reviewed at length with the patient today.  Concerns regarding medicines are outlined above.  No orders of the defined types were placed in this encounter.  No orders of the defined types were placed in this encounter.    Signed, AMelida Quitter MD  03/04/2022 10:56 AM    [place]

## 2022-03-04 NOTE — Progress Notes (Signed)
Kindly inform the patient that lab work for reversible causes of memory loss was all satisfactory

## 2022-03-04 NOTE — Patient Instructions (Signed)
Medication Instructions:  Your physician recommends that you continue on your current medications as directed. Please refer to the Current Medication list given to you today.  Labwork: None ordered.  Testing/Procedures: None ordered.  Follow-Up:  Your physician wants you to follow-up in: one year with Dr. Myles Gip.  You will receive a reminder letter in the mail two months in advance. If you don't receive a letter, please call our office to schedule the follow-up appointment.    Implantable Loop Recorder Placement, Care After This sheet gives you information about how to care for yourself after your procedure. Your health care provider may also give you more specific instructions. If you have problems or questions, contact your health care provider. What can I expect after the procedure? After the procedure, it is common to have: Soreness or discomfort near the incision. Some swelling or bruising near the incision.  Follow these instructions at home: Incision care  Monitor your cardiac device site for redness, swelling, and drainage. Call the device clinic at [phone] if you experience these symptoms or fever/chills.  Keep the large square bandage on your site for 24 hours and then you may remove it yourself. Keep the steri-strips underneath in place.   You may shower after 72 hours / 3 days from your procedure with the steri-strips in place. They will usually fall off on their own, or may be removed after 10 days. Pat dry.   Avoid lotions, ointments, or perfumes over your incision until it is well-healed.  Please do not submerge in water until your site is completely healed.   Your device is MRI compatible.   Remote monitoring is used to monitor your cardiac device from home. This monitoring is scheduled every month by our office. It allows [place] to keep an eye on the function of your device to ensure it is working properly.  If your wound site starts to bleed apply pressure.     For help with the monitor please call [employer] Monitor Support Specialist directly at [phone].    If you have any questions/concerns please call the device clinic at [phone].  Activity  Return to your normal activities.  General instructions Follow instructions from your health care provider about how to manage your implantable loop recorder and transmit the information. Learn how to activate a recording if this is necessary for your type of device. You may go through a metal detection gate, and you may let someone hold a metal detector over your chest. Show your ID card if needed. Do not have an MRI unless you check with your health care provider first. Take over-the-counter and prescription medicines only as told by your health care provider. Keep all follow-up visits as told by your health care provider. This is important. Contact a health care provider if: You have redness, swelling, or pain around your incision. You have a fever. You have pain that is not relieved by your pain medicine. You have triggered your device because of fainting (syncope) or because of a heartbeat that feels like it is racing, slow, fluttering, or skipping (palpitations). Get help right away if you have: Chest pain. Difficulty breathing. Summary After the procedure, it is common to have soreness or discomfort near the incision. Change your dressing as told by your health care provider. Follow instructions from your health care provider about how to manage your implantable loop recorder and transmit the information. Keep all follow-up visits as told by your health care provider. This is important. This information  is not intended to replace advice given to you by your health care provider. Make sure you discuss any questions you have with your health care provider. Document Released: 03/17/2015 Document Revised: 05/21/2017 Document Reviewed: 05/21/2017 Elsevier Patient Education  2020 [employer].

## 2022-03-16 ENCOUNTER — Telehealth: Payer: Self-pay

## 2022-03-16 NOTE — Telephone Encounter (Signed)
ILR unscheduled transmission with symptom episode on 03/16/22 @ 0942. Rhythm SR with variable rates and PVC. 10 AF episodes, longest 6.5hrs on 03/08/22. Last episode 03/15/22. All episodes appear to be SR with variable rates and freq PAC's. Some episodes have higher voltage P waves for easier viewing of same rhythm (IE: 03/14/22 @ 1121 episode) vs low voltage P wave during rhythm 03/11/22 @ 0907pm. Routing for further review- JJB   I spoke with Gaspar Bidding from [facility], he reviewed and confirmed this looks like periods of true afib.  NOTE: patient is NOT on and [place].  Spoke with daughter and patient is not symptomatic.

## 2022-03-17 ENCOUNTER — Encounter: Payer: Self-pay | Admitting: Family Medicine

## 2022-03-17 ENCOUNTER — Ambulatory Visit (INDEPENDENT_AMBULATORY_CARE_PROVIDER_SITE_OTHER): Payer: Medicare Other | Admitting: Family Medicine

## 2022-03-17 VITALS — BP 176/90 | HR 84 | Temp 97.5°F | Ht 61.0 in | Wt 182.4 lb

## 2022-03-17 DIAGNOSIS — I10 Essential (primary) hypertension: Secondary | ICD-10-CM | POA: Diagnosis not present

## 2022-03-17 DIAGNOSIS — W5501XA Bitten by cat, initial encounter: Secondary | ICD-10-CM | POA: Diagnosis not present

## 2022-03-17 DIAGNOSIS — Z23 Encounter for immunization: Secondary | ICD-10-CM | POA: Diagnosis not present

## 2022-03-17 DIAGNOSIS — Z8673 Personal history of transient ischemic attack (TIA), and cerebral infarction without residual deficits: Secondary | ICD-10-CM

## 2022-03-17 DIAGNOSIS — G3184 Mild cognitive impairment, so stated: Secondary | ICD-10-CM | POA: Diagnosis not present

## 2022-03-17 DIAGNOSIS — N1832 Chronic kidney disease, stage 3b: Secondary | ICD-10-CM

## 2022-03-17 DIAGNOSIS — Z8679 Personal history of other diseases of the circulatory system: Secondary | ICD-10-CM

## 2022-03-17 MED ORDER — AMOXICILLIN-POT CLAVULANATE 875-125 MG PO TABS: 1.0000 | ORAL_TABLET | Freq: Two times a day (BID) | ORAL | 0 refills | Status: DC

## 2022-03-17 MED ORDER — LOSARTAN POTASSIUM 25 MG PO TABS: 25.0000 mg | ORAL_TABLET | Freq: Every day | ORAL | 3 refills | Status: DC

## 2022-03-17 NOTE — Assessment & Plan Note (Signed)
Recent loop recorder noting a fib as poss cause On plavix and asa -to discuss anticoag with her cardiologist  H/o falls and brain hemorrhage in the past so not low risk Pt aware and plans to d/w cardiology   Planning to tx HTN now that status has stabilized

## 2022-03-17 NOTE — Assessment & Plan Note (Signed)
In setting of strokes and tia Under care of neurology Last note and plan reviewed  Tolerating aricept 10 mg daily

## 2022-03-17 NOTE — Assessment & Plan Note (Signed)
Enc good fluid intake GFR 33 in sept   Trial of losartan 25 mg for HTN with close monitoring  F/u approx 1 mo with labs

## 2022-03-17 NOTE — Assessment & Plan Note (Signed)
Pt has been w/o bp tx to permit for HTn following a stroke Now stable and with neuro f/u can resume  Review prior px and tolerances  H/o CAD and also CKD Has tolerated losartan in the past Will start 25 mg daily watching closely for hypotension or side eff Plan f/u with lab in approx 1 mo  Continue cardiology and neurology f/u

## 2022-03-17 NOTE — Assessment & Plan Note (Signed)
Noted on recent loop recorder  Likely cause of CVA  Pt plans to d/w cardiology -consider anticoag

## 2022-03-17 NOTE — Patient Instructions (Addendum)
Start losartan 25 daily for your blood pressure  If dizzy or side effects let us know  If BP is 90/50 or below let us know  Try and eat a healthy balanced diet  Less sodium if possible    Take augmentin for the cat injury  Watch for redness/swelling /pain= call if this occurs Clean well with soap and water  Cover loosely if needed   Tetanus shot today  If you cat continues to bite you need to re home it   Follow up in 4-5 weeks and take your medicine that day   Follow up with cardiology as planned

## 2022-03-17 NOTE — Assessment & Plan Note (Signed)
Right lower forearm  Her own cat/immunized and provoked  Skin tear noted w/o signs of infx  Td given  Augmentin px for 7d  Inst to keep clean with soap and water and watch closely for redness/swelling/pain  Update if not starting to improve in a week or if worsening

## 2022-03-17 NOTE — Progress Notes (Signed)
Subjective:    Patient ID: Valerie Ramirez, female    DOB: 11/07/1932, 87 y.o.   MRN: [id number]  HPI Pt presents for HTN and cat scratch injury / may need tetanus shot  Wt Readings from Last 3 Encounters:  03/17/22 182 lb 6 oz (82.7 kg)  03/04/22 184 lb 12.8 oz (83.8 kg)  03/02/22 180 lb 12.8 oz (82 kg)   34.46 kg/m   HTN bp is up  No cp or palpitations or headaches or edema  No side effects to medicines  BP Readings from Last 3 Encounters:  03/17/22 (!) 176/90  03/04/22 (!) 144/90  03/02/22 (!) 209/96     Pulse Readings from Last 3 Encounters:  03/17/22 84  03/04/22 83  03/02/22 81    Last visit discussed permissible HTN in setting of recent CVA   Prior to this coreg and losartan   More distantly on amlodipine , lisinopril and cardura    Had visit with cardiology on 11/16 Noted poss brief atrial runs on monitor (no def AF) Loop recorder implanted -then episodes of a fib were seen possibly Bp was 144/90 at that visit   H/o CKD Lab Results  Component Value Date   CREATININE 1.51 (H) 01/09/2022   BUN 29 (H) 01/09/2022   NA 139 01/09/2022   K 4.6 01/09/2022   CL 105 01/09/2022   CO2 25 01/09/2022   GFR 33 in sept   Lab Results  Component Value Date   WBC 8.3 01/09/2022   HGB 12.5 01/09/2022   HCT 38.8 01/09/2022   MCV 94.4 01/09/2022   PLT 169 01/09/2022     Neuro visit 11/14 with Dr Leonie Man for cva and MCI Taking plavix and asa -no further episodes of tia or cva  Noted goal for bp was 130/90 or below  Bp at that visit was quite high initially at 209/96   PT is coming in to work with her  L leg is weak  Balance is not good   Uses her cane if outside Nothing inside   Aricept dose was inc to 10 mg daily   Sees Dr Jaynee Eagles for headaches - sees her in April  ? Of occipital neuralgia    Has h/o CAD with angioplasty in the past  H/o subdural hem in 2019   Lab Results  Component Value Date   TSH 1.690 03/02/2022   Feels ok in general   Getting better   She quit taking her medicine  Is back on track with it now -understands the importance   Cat (immunized/hers) bit and scratched her R arm this weekend Bled at first  Keeping it clean   Declines mammograms at her age  Patient Active Problem List   Diagnosis Date Noted   Cat bite 03/17/2022   History of atrial fibrillation 03/17/2022   Excessive daytime sleepiness 02/01/2022   Coronary artery disease of native artery of native heart with stable angina pectoris ([place]) 02/01/2022   Dysarthria 01/26/2022   Vascular dementia ([place]) 01/10/2022   Dyslipidemia 01/09/2022   Mild cognitive impairment 01/06/2022   Fall 06/24/2021   Syncope and collapse 06/24/2021   Acute pain of both knees 06/24/2021   Light headedness 06/24/2021   Bilateral impacted cerumen 06/24/2021   Heme positive stool 02/24/2021   History of CVA (cerebrovascular accident) 02/14/2020   Headache 02/14/2020   Diarrhea 12/26/2017   Dizziness 12/26/2017   Depression 10/26/2017   H/O subdural hemorrhage 09/18/2017   CAD S/P percutaneous coronary  angioplasty 09/18/2017   Closed fracture of nasal bones    Epistaxis    Elevated transaminase level 08/03/2017   Radial artery injury, left, sequela 06/15/2017   H/O: CVA (cerebrovascular accident) 05/26/2017   Sick sinus syndrome ([place]) 05/25/2017   Exertional chest pain 04/05/2017   Chronic kidney disease, stage 3b ([place]) 04/05/2017   Anemia 04/05/2017   Thoracic back pain 09/15/2016   History of radius fracture 06/01/2016   AKI (acute kidney injury) ([place]) 04/25/2016   Leukocytosis 04/25/2016   Status post cervical spinal fusion 08/18/2015   Acute pain of right shoulder 05/16/2015   Osteoarthritis, hip, bilateral 08/12/2014   Hearing loss in right ear 06/12/2014   Colon cancer screening 08/10/2013   Seborrheic keratoses, inflamed 08/10/2013   History of fall 04/10/2013   Encounter for Medicare annual wellness exam 08/08/2012   Gout 07/14/2012    History of endometrial cancer 05/25/2012   Degenerative joint disease of cervical spine 05/03/2011   Other screening mammogram 03/31/2011   Prediabetes 09/26/2007   Obesity (BMI 30-39.9) 07/12/2007   Essential hypertension 07/11/2007   History of cardiomyopathy 07/11/2007   Osteoarthritis 07/11/2007   MIXED INCONTINENCE URGE AND STRESS 07/11/2007   Hypothyroidism 10/05/2006   HYPERCHOLESTEROLEMIA, PURE 10/05/2006   Past Medical History:  Diagnosis Date   Arthritis    OA   CAD (coronary artery disease)    stent December 2018   Cancer [facility])    endometrial   GERD (gastroesophageal reflux disease)    Hearing loss    History of kidney stones    Hyperlipidemia    Hypertension    Hypothyroid    "not in years"   Shortness of breath dyspnea    04/25/17- not anymore   Past Surgical History:  Procedure Laterality Date   ABDOMINAL HYSTERECTOMY     ANTERIOR CERVICAL DECOMP/DISCECTOMY FUSION [name] 08/18/2015   Procedure: C5-6, C6-7 Anterior Cervical Discectomy and Fusion, Allograft, Plate;  Surgeon: Marybelle Killings, MD;  Location: [place];  Service: Orthopedics;  Laterality: [name];   bone spur removed Right    shoulder   BOWEL RESECTION  07/04/2012   Procedure: SMALL BOWEL RESECTION;  Surgeon: Alvino Chapel, MD;  Location: [facility];  Service: Gynecology;;   BREAST BIOPSY     CHOLECYSTECTOMY     CORONARY STENT INTERVENTION [name] 04/06/2017   Procedure: CORONARY STENT INTERVENTION;  Surgeon: Wellington Hampshire, MD;  Location: [place] [facility];  Service: Cardiovascular;  Laterality: [name];   DILATION AND CURETTAGE OF UTERUS  1999   EYE SURGERY Bilateral    cataracts   FALSE ANEURYSM REPAIR Right 04/26/2017   Procedure: 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;  Surgeon: Conrad [place], MD;  Location: [place];  Service: Vascular;  Laterality: Right;   HAND SURGERY  12/2008   after hand fx/due to fall   JOINT REPLACEMENT Bilateral 07/1998   knees   KNEE ARTHROPLASTY  05/23/1998   total  right   LAPAROTOMY Bilateral 07/04/2012   Procedure: EXPLORATORY LAPAROTOMY TOTAL ABDOMINAL HYSTERECTOMY BILATERAL SALPINGO-OOPHORECTOMY with small bowel resection ;  Surgeon: Alvino Chapel, MD;  [facility];  Service: Gynecology;  Laterality: Bilateral;   LEFT HEART CATH AND CORONARY ANGIOGRAPHY [name] 04/06/2017   Procedure: LEFT HEART CATH AND CORONARY ANGIOGRAPHY;  Surgeon: Jolaine Artist, MD;  Location: [place] [facility];  Service: Cardiovascular;  Laterality: [name];   polyp removal  1999   TEE WITHOUT CARDIOVERSION [name] 01/12/2022   Procedure: TRANSESOPHAGEAL ECHOCARDIOGRAM (TEE);  Surgeon: Minna Merritts, MD;  Location: [facility];  Service: Cardiovascular;  Laterality: [name];   TUBAL LIGATION     Social History   Tobacco Use   Smoking status: Never   Smokeless tobacco: Never  Vaping Use   Vaping Use: Never used  Substance Use Topics   Alcohol use: Never    Alcohol/week: 0.0 standard drinks of alcohol   Drug use: Never   Family History  Problem Relation Age of Onset   Heart disease Father    Heart attack Father    Stroke Father    Heart disease Sister        CABG   Stroke Sister    Cancer Sister    Bladder Cancer Sister    Stroke Sister        "massive brain bleed"   Leukemia Brother    Lung cancer Brother    Leukemia Brother    Stomach cancer Brother    Heart disease Brother        CABG   Allergies  Allergen Reactions   Allopurinol Nausea Only   Lipitor [Atorvastatin Calcium] Itching and Other (See Comments)    Leg aches and aching all over   Morphine And Related Other (See Comments)    Makes the patient "loopy,"    Simvastatin Other (See Comments)    MUSCLE PAIN   Tape Itching, Rash and Other (See Comments)    "Cannot use paper tape" --- also causes blisters. Use plastic tape   Current Outpatient Medications on File Prior to Visit  Medication Sig Dispense Refill   Cholecalciferol (VITAMIN D-3) 25 MCG (1000 UT) CAPS Take 1,000 Units by mouth  daily.     clopidogrel (PLAVIX) 75 MG tablet Take 1 tablet (75 mg total) by mouth daily. 90 tablet 3   Colchicine 0.6 MG CAPS Take 1 capsule by mouth in the morning and at bedtime. (Patient taking differently: Take 0.6 mg by mouth in the morning and at bedtime.) 60 capsule 1   Cyanocobalamin (VITAMIN B-12 PO) Take 1 tablet by mouth daily.     diclofenac Sodium (VOLTAREN ARTHRITIS PAIN) 1 % GEL Apply 4 g topically 4 (four) times daily. 100 g 11   donepezil (ARICEPT) 5 MG tablet Take 2 tablets (10 mg total) by mouth at bedtime. 60 tablet 6   DULoxetine (CYMBALTA) 30 MG capsule TAKE ONE CAPSULE BY MOUTH DAILY 90 capsule 3   FEROSUL 325 (65 Fe) MG tablet TAKE ONE TABLET BY MOUTH DAILY 90 tablet 1   lidocaine (LIDODERM) 5 % Place 1 patch onto the skin daily. Remove & Discard patch within 12 hours or as directed by MD 30 patch 11   Magnesium Chloride (MAG-SR PLUS CALCIUM PO) Take by mouth. Taking 1 tab once for dizziness     multivitamin-iron-minerals-folic acid (CENTRUM) chewable tablet Chew 1 tablet by mouth daily. 90 tablet 0   nitroGLYCERIN (NITROSTAT) 0.4 MG SL tablet Place 1 tablet (0.4 mg total) under the tongue every 5 (five) minutes x 3 doses as needed for chest pain. 25 tablet 12   No current facility-administered medications on file prior to visit.    Review of Systems  Constitutional:  Negative for activity change, appetite change, fatigue, fever and unexpected weight change.  HENT:  Negative for congestion, ear pain, rhinorrhea, sinus pressure and sore throat.   Eyes:  Negative for pain, redness and visual disturbance.  Respiratory:  Negative for cough, shortness of breath and wheezing.   Cardiovascular:  Negative for  chest pain and palpitations.  Gastrointestinal:  Negative for abdominal pain, blood in stool, constipation and diarrhea.  Endocrine: Negative for polydipsia and polyuria.  Genitourinary:  Negative for dysuria, frequency and urgency.  Musculoskeletal:  Negative for  arthralgias, back pain and myalgias.  Skin:  Positive for wound. Negative for pallor and rash.  Allergic/Immunologic: Negative for environmental allergies.  Neurological:  Positive for weakness. Negative for dizziness, syncope and headaches.       Left leg weakness from cva  Hematological:  Negative for adenopathy. Does not bruise/bleed easily.  Psychiatric/Behavioral:  Negative for decreased concentration and dysphoric mood. The patient is not nervous/anxious.        Objective:   Physical Exam Constitutional:      General: She is not in acute distress.    Appearance: Normal appearance. She is well-developed. She is obese. She is not ill-appearing or diaphoretic.  HENT:     Head: Normocephalic and atraumatic.  Eyes:     Conjunctiva/sclera: Conjunctivae normal.     Pupils: Pupils are equal, round, and reactive to light.  Neck:     Thyroid: No thyromegaly.     Vascular: No carotid bruit or JVD.  Cardiovascular:     Rate and Rhythm: Normal rate and regular rhythm.     Heart sounds: Normal heart sounds.     No gallop.  Pulmonary:     Effort: Pulmonary effort is normal. No respiratory distress.     Breath sounds: Normal breath sounds. No wheezing or rales.  Abdominal:     General: There is no distension or abdominal bruit.     Palpations: Abdomen is soft.  Musculoskeletal:     Cervical back: Normal range of motion and neck supple.     Right lower leg: No edema.     Left lower leg: No edema.  Lymphadenopathy:     Cervical: No cervical adenopathy.  Skin:    General: Skin is warm and dry.     Coloration: Skin is not jaundiced or pale.     Findings: No bruising or rash.     Comments: Irregular shaped akin tear on R lower forearm with small puncture wound  Minimal erythema  Not actively bleeding No swelling or drainage   Neurological:     Mental Status: She is alert.     Coordination: Coordination normal.     Deep Tendon Reflexes: Reflexes are normal and symmetric. Reflexes  normal.  Psychiatric:        Mood and Affect: Mood normal.           Assessment & Plan:   Problem List Items Addressed This Visit       Cardiovascular and Mediastinum   Essential hypertension - Primary    Pt has been w/o bp tx to permit for HTn following a stroke Now stable and with neuro f/u can resume  Review prior px and tolerances  H/o CAD and also CKD Has tolerated losartan in the past Will start 25 mg daily watching closely for hypotension or side eff Plan f/u with lab in approx 1 mo  Continue cardiology and neurology f/u      Relevant Medications   losartan (COZAAR) 25 MG tablet     Genitourinary   Chronic kidney disease, stage 3b ([place])    Enc good fluid intake GFR 33 in sept   Trial of losartan 25 mg for HTN with close monitoring  F/u approx 1 mo with labs  Other   Cat bite    Right lower forearm  Her own cat/immunized and provoked  Skin tear noted w/o signs of infx  Td given  Augmentin px for 7d  Inst to keep clean with soap and water and watch closely for redness/swelling/pain  Update if not starting to improve in a week or if worsening         Relevant Orders   Td : Tetanus/diphtheria >7yo Preservative  free (Completed)   History of atrial fibrillation    Noted on recent loop recorder  Likely cause of CVA  Pt plans to d/w cardiology -consider anticoag      History of CVA (cerebrovascular accident)    Recent loop recorder noting a fib as poss cause On plavix and asa -to discuss anticoag with her cardiologist  H/o falls and brain hemorrhage in the past so not low risk Pt aware and plans to d/w cardiology   Planning to tx HTN now that status has stabilized       Mild cognitive impairment    In setting of strokes and tia Under care of neurology Last note and plan reviewed  Tolerating aricept 10 mg daily

## 2022-03-19 NOTE — Telephone Encounter (Signed)
Pt has follow up scheduled to discuss findings.

## 2022-03-22 ENCOUNTER — Ambulatory Visit: Payer: Medicare Other | Attending: Cardiovascular Disease | Admitting: Cardiovascular Disease

## 2022-03-22 ENCOUNTER — Encounter: Payer: Self-pay | Admitting: Cardiovascular Disease

## 2022-03-22 VITALS — BP 138/74 | HR 86 | Ht 62.0 in | Wt 182.2 lb

## 2022-03-22 DIAGNOSIS — I480 Paroxysmal atrial fibrillation: Secondary | ICD-10-CM | POA: Diagnosis not present

## 2022-03-22 DIAGNOSIS — I639 Cerebral infarction, unspecified: Secondary | ICD-10-CM | POA: Diagnosis not present

## 2022-03-22 MED ORDER — APIXABAN 2.5 MG PO TABS: 2.5000 mg | ORAL_TABLET | Freq: Two times a day (BID) | ORAL | 3 refills | Status: DC

## 2022-03-22 NOTE — Addendum Note (Signed)
Addended by: Bernestine Amass on: 03/22/2022 04:08 PM   Modules accepted: Orders

## 2022-03-22 NOTE — Progress Notes (Signed)
Cardiology Office Note:    Date:  03/22/2022   ID:  Valerie Ramirez, DOB 1932-05-16, MRN [id number]  PCP:  Abner Greenspan, MD   [facility] Cardiologist:  Peter Martinique, MD Electrophysiologist:  Melida Quitter, MD     Referring MD: Abner Greenspan, MD   Chief complaint: many strokes  History of Present Illness:    Valerie Ramirez is a 88 y.o. female with a hx of recurrent strokes referred for implantable loop recorder placement.  She is currently wearing a 30 day monitor that will be completed today.   She denies any symptoms of AF -- no palpitations, paroxysms of fatigue.   Past Medical History:  Diagnosis Date   Arthritis    OA   CAD (coronary artery disease)    stent December 2018   Cancer [facility])    endometrial   GERD (gastroesophageal reflux disease)    Hearing loss    History of kidney stones    Hyperlipidemia    Hypertension    Hypothyroid    "not in years"   Shortness of breath dyspnea    04/25/17- not anymore    Past Surgical History:  Procedure Laterality Date   ABDOMINAL HYSTERECTOMY     ANTERIOR CERVICAL DECOMP/DISCECTOMY FUSION [name] 08/18/2015   Procedure: C5-6, C6-7 Anterior Cervical Discectomy and Fusion, Allograft, Plate;  Surgeon: Marybelle Killings, MD;  Location: [place];  Service: Orthopedics;  Laterality: [name];   bone spur removed Right    shoulder   BOWEL RESECTION  07/04/2012   Procedure: SMALL BOWEL RESECTION;  Surgeon: Alvino Chapel, MD;  [facility];  Service: Gynecology;;   BREAST BIOPSY     CHOLECYSTECTOMY     CORONARY STENT INTERVENTION [name] 04/06/2017   Procedure: CORONARY STENT INTERVENTION;  Surgeon: Wellington Hampshire, MD;  Location: [place] [facility];  Service: Cardiovascular;  Laterality: [name];   DILATION AND CURETTAGE OF UTERUS  1999   EYE SURGERY Bilateral    cataracts   FALSE ANEURYSM REPAIR Right 04/26/2017   Procedure: 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;  Surgeon: Conrad Price, MD;   Location: [place];  Service: Vascular;  Laterality: Right;   HAND SURGERY  12/2008   after hand fx/due to fall   JOINT REPLACEMENT Bilateral 07/1998   knees   KNEE ARTHROPLASTY  05/23/1998   total right   LAPAROTOMY Bilateral 07/04/2012   Procedure: EXPLORATORY LAPAROTOMY TOTAL ABDOMINAL HYSTERECTOMY BILATERAL SALPINGO-OOPHORECTOMY with small bowel resection ;  Surgeon: Alvino Chapel, MD;  [facility];  Service: Gynecology;  Laterality: Bilateral;   LEFT HEART CATH AND CORONARY ANGIOGRAPHY [name] 04/06/2017   Procedure: LEFT HEART CATH AND CORONARY ANGIOGRAPHY;  Surgeon: Jolaine Artist, MD;  Location: [place] [facility];  Service: Cardiovascular;  Laterality: [name];   polyp removal  1999   TEE WITHOUT CARDIOVERSION [name] 01/12/2022   Procedure: TRANSESOPHAGEAL ECHOCARDIOGRAM (TEE);  Surgeon: Minna Merritts, MD;  Location: [facility];  Service: Cardiovascular;  Laterality: [name];   TUBAL LIGATION      Current Medications: No outpatient medications have been marked as taking for the 03/22/22 encounter (Appointment) with Vishruth Seoane, Yetta Barre, MD.     Allergies:   Allopurinol, Lipitor [atorvastatin calcium], Morphine and related, Simvastatin, and Tape   Social History   Socioeconomic History   Marital status: Widowed    Spouse name: Not on file   Number of children: 2   Years of education: Not on  file   Highest education level: High school graduate  Occupational History   Not on file  Tobacco Use   Smoking status: Never   Smokeless tobacco: Never  Vaping Use   Vaping Use: Never used  Substance and Sexual Activity   Alcohol use: Never    Alcohol/week: 0.0 standard drinks of alcohol   Drug use: Never   Sexual activity: Never    Birth control/protection: Post-menopausal  Other Topics Concern   Not on file  Social History Narrative   Not on file   Social Determinants of Health   Financial Resource Strain: Low Risk  (07/16/2021)   Overall Financial Resource Strain  (CARDIA)    Difficulty of Paying Living Expenses: Not hard at all  Food Insecurity: No Food Insecurity (01/09/2022)   Hunger Vital Sign    Worried About Running Out of Food in the Last Year: Never true    Ran Out of Food in the Last Year: Never true  Transportation Needs: No Transportation Needs (01/09/2022)   PRAPARE - Hydrologist (Medical): No    Lack of Transportation (Non-Medical): No  Physical Activity: Insufficiently Active (07/16/2021)   Exercise Vital Sign    Days of Exercise per Week: 4 days    Minutes of Exercise per Session: 30 min  Stress: No Stress Concern Present (07/16/2021)   [place]    Feeling of Stress : Not at all  Social Connections: Moderately Isolated (07/16/2021)   Social Connection and Isolation Panel [NHANES]    Frequency of Communication with Friends and Family: More than three times a week    Frequency of Social Gatherings with Friends and Family: Twice a week    Attends Religious Services: More than 4 times per year    Active Member of [employer] or Organizations: No    Attends Archivist Meetings: Never    Marital Status: Widowed     Family History: The patient's family history includes Bladder Cancer in her sister; Cancer in her sister; Heart attack in her father; Heart disease in her brother, father, and sister; Leukemia in her brother and brother; Lung cancer in her brother; Stomach cancer in her brother; Stroke in her father, sister, and sister.  ROS:   Please see the history of present illness.    All other systems reviewed and are negative.  EKGs/Labs/Other Studies Reviewed:      EKG:  Last EKG results: sinus rhythm    Recent Labs: 11/14/2021: Magnesium 2.4 01/09/2022: ALT 16; BUN 29; Creatinine, Ser 1.51; Hemoglobin 12.5; Platelets 169; Potassium 4.6; Sodium 139 03/02/2022: TSH 1.690      Physical Exam:    VS:  There were no vitals  taken for this visit.    Wt Readings from Last 3 Encounters:  03/17/22 182 lb 6 oz (82.7 kg)  03/04/22 184 lb 12.8 oz (83.8 kg)  03/02/22 180 lb 12.8 oz (82 kg)     GEN:  Well nourished, well developed in no acute distress CARDIAC: RRR, no murmurs, rubs, gallops RESPIRATORY:  Normal work of breathing MUSCULOSKELETAL: no edema    ASSESSMENT & PLAN:    Recurrent strokes, likely embolic: ILR monitor was placed after 30 day monitor did not show any AF. AF was detected on ILR shortly thereafter. She does not have significant bleeding problems, no evidence of GI bleed. Will start eliquis and DC antiplatelet medications. I advised her to stop the eliquis and let us  know if she develops black stools. Atrial fibrillation: asymptomatic, rate controlled. Continue to monitor with loop recorder           Medication Adjustments/Labs and Tests Ordered: Current medicines are reviewed at length with the patient today.  Concerns regarding medicines are outlined above.  No orders of the defined types were placed in this encounter.  No orders of the defined types were placed in this encounter.    Signed, Melida Quitter, MD  03/22/2022 3:10 PM    [place]

## 2022-03-22 NOTE — Patient Instructions (Addendum)
Medication Instructions:  Your physician has recommended you make the following change in your medication:  1) START taking Eliquis 2.5 mg twice daily 2) STOP taking  Plavix  *If you need a refill on your cardiac medications before your next appointment, please call your pharmacy*  Follow-Up: At [facility], you and your health needs are our priority.  As part of our continuing mission to provide you with exceptional heart care, we have created designated Provider Care Teams.  These Care Teams include your primary Cardiologist (physician) and Advanced Practice Providers (APPs -  Physician Assistants and Nurse Practitioners) who all work together to provide you with the care you need, when you need it.  Your next appointment:   6 months  The format for your next appointment:   In Person  Provider:   You may see Melida Quitter, MD or one of the following Advanced Practice Providers on your designated Care Team:   Tommye Standard, [place] Legrand Como "Jonni Sanger" Chalmers Cater, [place]    Important Information About Sugar

## 2022-03-28 DIAGNOSIS — I2510 Atherosclerotic heart disease of native coronary artery without angina pectoris: Secondary | ICD-10-CM | POA: Diagnosis not present

## 2022-03-28 DIAGNOSIS — M47812 Spondylosis without myelopathy or radiculopathy, cervical region: Secondary | ICD-10-CM | POA: Diagnosis not present

## 2022-03-28 DIAGNOSIS — D72829 Elevated white blood cell count, unspecified: Secondary | ICD-10-CM | POA: Diagnosis not present

## 2022-03-28 DIAGNOSIS — I129 Hypertensive chronic kidney disease with stage 1 through stage 4 chronic kidney disease, or unspecified chronic kidney disease: Secondary | ICD-10-CM | POA: Diagnosis not present

## 2022-03-28 DIAGNOSIS — I429 Cardiomyopathy, unspecified: Secondary | ICD-10-CM | POA: Diagnosis not present

## 2022-03-28 DIAGNOSIS — H6123 Impacted cerumen, bilateral: Secondary | ICD-10-CM | POA: Diagnosis not present

## 2022-03-28 DIAGNOSIS — H9191 Unspecified hearing loss, right ear: Secondary | ICD-10-CM | POA: Diagnosis not present

## 2022-03-28 DIAGNOSIS — I495 Sick sinus syndrome: Secondary | ICD-10-CM | POA: Diagnosis not present

## 2022-03-28 DIAGNOSIS — N1832 Chronic kidney disease, stage 3b: Secondary | ICD-10-CM | POA: Diagnosis not present

## 2022-03-28 DIAGNOSIS — E039 Hypothyroidism, unspecified: Secondary | ICD-10-CM | POA: Diagnosis not present

## 2022-03-28 DIAGNOSIS — F0153 Vascular dementia, unspecified severity, with mood disturbance: Secondary | ICD-10-CM | POA: Diagnosis not present

## 2022-03-28 DIAGNOSIS — F32A Depression, unspecified: Secondary | ICD-10-CM | POA: Diagnosis not present

## 2022-03-28 DIAGNOSIS — L820 Inflamed seborrheic keratosis: Secondary | ICD-10-CM | POA: Diagnosis not present

## 2022-03-28 DIAGNOSIS — M109 Gout, unspecified: Secondary | ICD-10-CM | POA: Diagnosis not present

## 2022-03-28 DIAGNOSIS — E7800 Pure hypercholesterolemia, unspecified: Secondary | ICD-10-CM | POA: Diagnosis not present

## 2022-03-28 DIAGNOSIS — M160 Bilateral primary osteoarthritis of hip: Secondary | ICD-10-CM | POA: Diagnosis not present

## 2022-03-29 ENCOUNTER — Telehealth: Payer: Self-pay | Admitting: Neurology

## 2022-03-29 NOTE — Telephone Encounter (Signed)
Split- [employer] medicare no auth req ref # [id number] C on 03/15/22.  Patient is scheduled at [facility] for 05/25/21 at 9 pm.  Mailed packet to the patient.

## 2022-03-30 ENCOUNTER — Other Ambulatory Visit: Payer: Self-pay | Admitting: Family Medicine

## 2022-03-31 NOTE — Telephone Encounter (Signed)
Please ask if she was off this for a while?  Does she thinks she needs it?   Any gout flares  The med can be hard on kidneys

## 2022-03-31 NOTE — Telephone Encounter (Signed)
Last filled on 08/28/20 #60 caps with 1 refill, last OV was 03/17/22

## 2022-04-01 ENCOUNTER — Telehealth: Payer: Self-pay | Admitting: Family Medicine

## 2022-04-01 NOTE — Telephone Encounter (Signed)
  Encourage patient to contact the pharmacy for refills or they can request refills through [facility]  Did the patient contact the pharmacy: Yes    LAST APPOINTMENT DATE: 03/17/2022  NEXT APPOINTMENT DATE: [name]  MEDICATION: Colchicine 0.6 MG CAPS   Is the patient out of medication? Yes  PHARMACY: [facility] [id number] - [address], [place]   Let patient know to contact pharmacy at the end of the day to make sure medication is ready.  Please notify patient to allow 48-72 hours to process

## 2022-04-01 NOTE — Telephone Encounter (Signed)
Spoke with daughter and she said pt has been taking med every day. She isn't having a gout flare up but just needed a refill. Daughter said pt takes med once daily not BID but hasn't stopped taking.

## 2022-04-01 NOTE — Telephone Encounter (Signed)
See refill request.

## 2022-04-02 NOTE — Telephone Encounter (Signed)
Patient seen on 03/22/22.

## 2022-04-05 ENCOUNTER — Ambulatory Visit (INDEPENDENT_AMBULATORY_CARE_PROVIDER_SITE_OTHER): Payer: Medicare Other

## 2022-04-05 DIAGNOSIS — I639 Cerebral infarction, unspecified: Secondary | ICD-10-CM

## 2022-04-06 LAB — CUP PACEART REMOTE DEVICE CHECK
Date Time Interrogation Session: 20231218054008
Implantable Pulse Generator Implant Date: 20231116
Pulse Gen Serial Number: 511019978

## 2022-04-21 ENCOUNTER — Telehealth: Payer: Self-pay | Admitting: *Deleted

## 2022-04-21 NOTE — Telephone Encounter (Signed)
Received a fax from [facility] labeled "Client Coordination Note Report" for Dr Jaynee Eagles which notifies of a drug-drug interaction between Eliquis and Plavix. Dr Jaynee Eagles doesn't prescribe either of these medications and plavix is not on pt's active medication list. Dr Jaynee Eagles aware of the notice and asked for [facility] to be called. I called and LVM for Valerie Ramirez to address. I advised Dr Loura Pardon is pt's PCP. I asked for call back and left our office number.

## 2022-05-06 ENCOUNTER — Ambulatory Visit: Payer: Medicare Other | Attending: Cardiovascular Disease

## 2022-05-06 DIAGNOSIS — R55 Syncope and collapse: Secondary | ICD-10-CM | POA: Diagnosis not present

## 2022-05-06 LAB — CUP PACEART REMOTE DEVICE CHECK
Date Time Interrogation Session: 20240118020159
Implantable Pulse Generator Implant Date: 20231116
Pulse Gen Serial Number: 511019978

## 2022-05-12 NOTE — Progress Notes (Signed)
Merlin Loop Recorder  

## 2022-05-25 ENCOUNTER — Ambulatory Visit (INDEPENDENT_AMBULATORY_CARE_PROVIDER_SITE_OTHER): Payer: Medicare Other | Admitting: Neurology

## 2022-05-25 DIAGNOSIS — G4710 Hypersomnia, unspecified: Secondary | ICD-10-CM

## 2022-05-25 DIAGNOSIS — F01A0 Vascular dementia, mild, without behavioral disturbance, psychotic disturbance, mood disturbance, and anxiety: Secondary | ICD-10-CM

## 2022-05-25 DIAGNOSIS — G4719 Other hypersomnia: Secondary | ICD-10-CM

## 2022-05-25 DIAGNOSIS — I25118 Atherosclerotic heart disease of native coronary artery with other forms of angina pectoris: Secondary | ICD-10-CM

## 2022-05-25 DIAGNOSIS — Z8679 Personal history of other diseases of the circulatory system: Secondary | ICD-10-CM

## 2022-05-25 DIAGNOSIS — I2510 Atherosclerotic heart disease of native coronary artery without angina pectoris: Secondary | ICD-10-CM

## 2022-05-25 DIAGNOSIS — I639 Cerebral infarction, unspecified: Secondary | ICD-10-CM

## 2022-05-27 NOTE — Progress Notes (Signed)
Carelink Summary Report / Loop Recorder 

## 2022-06-02 ENCOUNTER — Telehealth: Payer: Self-pay | Admitting: Neurology

## 2022-06-02 NOTE — Procedures (Signed)
[facility] POLYSOMNOGRAPHY  INTERPRETATION REPORT   STUDY DATE:  05/25/2022     PATIENT NAME:  Valerie Ramirez         DATE OF BIRTH:  Jan 08, 1933  PATIENT ID:  [id number]    TYPE OF STUDY:  PSG  READING PHYSICIAN: Larey Seat, MD REFERRED BY: Sarina Ill, MD, Antony Contras, MD  SCORING TECHNICIAN: Gaylyn Cheers, RPSGT   HISTORY:  Valerie Ramirez is a 89 year-old Female patient of Dr Cathren Laine who has cervicogenic headaches and had suffered an embolic stroke. She is referred by Dr Jaynee Eagles, MD for CVA, SOB, Nocturia, snoring, excessive daytime sleepiness, sleeps in a recliner.  ADDITIONAL INFORMATION:  The Epworth Sleepiness Scale was endorsed at 16 /24 points (scores above or equal to 10 are suggestive of hypersomnolence).  Fatigue SS: endorsed at 60 /63 points.  GDS 3/ 15 points. Reason for consult : [name] is a 89 year old pleasant Caucasian lady seen today for initial office consultation visit for stroke. ?She is accompanied by her [name]. ?History is obtained from them and review of electronic medical records. ?I have personally reviewed pertinent available imaging films in PACS. She has past medical history of hypertension, hyperlipidemia, coronary artery disease s/p stents, remote endometrial cancer, kidney stones, prior left PCA stroke who was recently admitted in July 2023. Multifocal embolic strokes cryptogenic etiology in July 2023 with negative neurovascular work-up as well as another admission 01/09/22 for altered mental status and slurred speech and found to have small right anterior thalamic and left coronary radiator lacunar infarcts. MRI at that time also showed right occipital posterior parietal occipital infarct of remote age. She underwent outpatient 30-day heart monitor from 01/17/2022 through 02/2022 which was negative for cardiac arrhythmia and is now scheduled to undergo loop recorder insertion later this week by cardiac  electrophysiologist. ?Transthoracic echo showed ejection fraction of 60 to 65% and TEE on 01/12/2022 showed no cardiac source of embolism, PFO or clot.  Hemoglobin A1c on 11/14/2021 was borderline at 6.2.??MR angiogram of the brain on 11/14/2021 showed moderate mid right MCA M1, moderate to severe bilateral PCA P2 and severe left PCA P3 stenosis. She has also noticed mild memory and cognitive difficulties for the last year or so. She started on Aricept 5 mg which she is tolerating well without any GI or CNS side effects. ?The daughter feels that her cognitive difficulties are unchanged and not progressing. She also has chronic cervicogenic headaches for which she is being managed by Dr Jaynee Eagles. Height: 62 in Weight: 185 lbs (BMI 33) Neck Size: 16 in . MEDICATIONS: Aspirin, Vitamin D3, Plavix, Colchicine, Vitamin B 12, Voltaren Gel, Aricept, Cymbalta, Ferousol, Lidoderm, Mag-SR plus Calcium, Centrum, Nitrostat TECHNICAL DESCRIPTION: A registered sleep technologist ( RPSGT)  was in attendance for the duration of the recording.  Data collection, scoring, video monitoring, and reporting were performed in compliance with the AASM Manual for the Scoring of Sleep and Associated Events; (Hypopnea is scored based on the criteria listed in Section VIII D. 1b in the AASM Manual V2.6 using a 4% oxygen desaturation rule or Hypopnea is scored based on the criteria listed in Section VIII D. 1a in the AASM Manual V2.6 using 3% oxygen desaturation and /or arousal rule).   SLEEP CONTINUITY AND SLEEP ARCHITECTURE:  Lights-out was at 21:59: and lights-on at  04:42:, with  6.7 minutes of recording time . Total sleep time ( TST) was 241.0 minutes with a decreased sleep efficiency at 59.8%.  Sleep latency was increased at 101.5 minutes.  REM sleep latency was decreased at 0.0 minutes. Of the total sleep time, the percentage of stage N1 sleep was 8.9%, stage N2 sleep was 91%, stage N3 sleep was 0.0%, and REM sleep was 0.0%. Sleep  architecture as highly fragmented.  BODY POSITION:  TST was divided between the following sleep positions: 71.4% sleep time in supine; 28.6% left lateral; and 0% prone. There were 0 Stage R periods observed on this study night, 26 awakenings (i.e. transitions to Stage W from any sleep stage), and 71 total stage transitions. Wake after sleep onset (WASO) time accounted for 60 minutes.   RESPIRATORY MONITORING: Based on CMS criteria (using a 4% oxygen desaturation rule for scoring hypopneas), there were 86 apneas (84 obstructive; 0 central; 2 mixed), and 98 hypopneas.   The Apnea index was 21.4/h. Hypopnea index was 24.4/h. The AHI / apnea-hypopnea index was 45.8/h overall . No REM sleep recorded. There were 0 respiratory effort-related arousals (RERAs) scored, but snoring was loud at times and followed by microarousals- .  OXIMETRY: Oxyhemoglobin Saturation Nadir during sleep was at  65%, down  from a mean of 91%.  Of the Total sleep time (TST) hypoxemia (defined as  02 <89%) was present for 23.7 minutes, or 9.9% of total sleep time.  LIMB MOVEMENTS: There were 4 periodic limb movements of sleep (1.0/h), of which 1 (0.2/r) was associated with an arousal. AROUSAL: There were 107 arousals in total, for an arousal index of 27 arousals/hour.  Of these, 80 were identified as respiratory-related arousals (20 /h), 1 were PLM-related arousals (0 /h), and 40 were non-specific arousals (10 /h), and there were several microarousals.  EEG:  PSG EEG was of normal amplitude and slowed frequency, with symmetric manifestation of sleep stages. EKG: The electrocardiogram documented irregular rhythm with PVCs but not fibrillation.  The average heart rate during sleep was 80 bpm.  Heart rate during sleep varied between a minimum of 65 bpm and a maximum of 100 bpm. AUDIO and VIDEO: unremarkable.   IMPRESSION: 1) Sleep disordered breathing was present: AHI was 46/h, mostly Obstructive Sleep apnea and hypopnea with loud  intermittent snoring.  Fragmented sleep without REM sleep was noted.    2) Total sleep time was reduced at 241.0 minutes.  Sleep efficiency was decreased at 59.8%.  Sleep fragmentation was noted- The majority of sleep arousals was related to snoring and apnea with intermittent desaturation in oxygen. This stdy for a Medicare patient was scored by 4 % desaturation ( CMS guidelines). Would this sleep study be scored under AASM guidelines ( 3%) , her AHI would be 51/h.   RECOMMENDATIONS: I feel strongly that even mild CPAP pressure could reduce the apnea and hypopnea events in this patient. Since she snores, she would likely use a FFM, but it is possible to use a nasal pillow or cradle with good nasal patency and add a chinstrap.  Order for auto CPAP  ResMed starting from 4 cm water to 15 cm water pressure, 3 cm H20 for EPR,  heated humidification and interface selection as described above. The patient is to avoid supine sleep but may sleep in a recliner .   Larey Seat, MD [facility]            General Information  Name: Ayrabella, Vink BMI: [id number] Physician: Larey Seat, MD  ID: [id number] Height: 62.0 in Technician: Gaylyn Cheers, RPSGT  Sex: Female Weight: 185.0 lb Record:  [id number]  Age: 71 [01-02-1933] Date: 05/25/2022    Medical & Medication History    CRYSTALEE NEUZIL is a 89 y.o. year old White or Caucasian female patient seen here as a referral from Dr Jaynee Eagles for a sleep evaluation in light of embolic strokes. [name] has a past medical history of Arthritis, CAD (coronary artery disease), Cancer ([place]), GERD (gastroesophageal reflux disease), Hearing loss, History of kidney stones, Hyperlipidemia, Hypertension, Hypothyroid, and Shortness of breath dyspnea. Sleep relevant medical history: feeling tired at 9-10 pm, falls asleep in 30 minutes - wakes by 3 AAM and moves to her recliner- she has 2-3 nocturia, sleeps 1-3 hours in her  recliner. Sleeps only 5-6 hours at night, but naps in daytime. Nocturia/2-3, Sleep walking: none, no cervical spine injury, but anterior fusion, deviated septum but no surgery.  Aspirin, Vitamin D3, Plavix, Colchicine, Vitamin B 12, Voltaren Gel, Aricept, Cymbalta, Ferosul, Lidoderm, Mag-SR plus Calcium, Centrum, Nitrostat   Sleep Disorder      Comments   Patient arrived for a diagnostic polysomnogram. Procedure explained and all questions answered. Standard paste setup without complications. Patient slept right and supine. Delay to sleep onset. Mild to moderate to loud snoring was heard. Respiratory events observed, worse while supine. Occasional PVC's observed, patient has a known cardiac history and has an implanted Loop Recorder. No significant PLMS observed. No restroom visit.    Lights out: 09:59:48 PM Lights on: 04:42:42 AM   Time Total Supine Side Prone Upright  Recording (TRT) 6h 43.22m4h 37.017mh 6.51m8m 0.51m 54m0.51m  39mep (TST) 4h 1.51m 2h54m.51m 1h 109mm 0h 0331m 0h 0.76m  Late64m N1 N2 N3 REM Onset Per. Slp. Eff.  Actual 1h 41.31m 1h 44.583mh 0.51m 58m0.51m 146m1.31m 241m3.31m 5974m%   Stg34mr Wake N1 N2 N3 REM  Total 54.5 21.5 219.5 0.0 0.0  Supine 29.5 12.5 159.5 0.0 0.0  Side 25.0 9.0 60.0 0.0 0.0  Prone 0.0 0.0 0.0 0.0 0.0  Upright 0.0 0.0 0.0 0.0 0.0   Stg % Wake N1 N2 N3 REM  Total 18.4 8.9 91.1 0.0 0.0  Supine 10.0 5.2 66.2 0.0 0.0  Side 8.5 3.7 24.9 0.0 0.0  Prone 0.0 0.0 0.0 0.0 0.0  Upright 0.0 0.0 0.0 0.0 0.0     Apnea Summary Sub Supine Side Prone Upright  Total 86 Total 86 77 9 0 0    REM 0 0 0 0 0    NREM 86 77 9 0 0  Obs 84 REM 0 0 0 0 0    NREM 84 76 8 0 0  Mix 2 REM 0 0 0 0 0    NREM 2 1 1 $ 0 0  Cen 0 REM 0 0 0 0 0    NREM 0 0 0 0 0   Rera Summary Sub Supine Side Prone Upright  Total 0 Total 0 0 0 0 0    REM 0 0 0 0 0    NREM 0 0 0 0 0   Hypopnea Summary Sub Supine Side Prone Upright  Total 120 Total 120 74 46 0 0    REM 0 0 0 0 0    NREM 120 74 46  0 0   4% Hypopnea Summary Sub Supine Side Prone Upright  Total (4%) 98 Total 98 63 35 0 0    REM 0 0 0 0 0    NREM 98 63 35 0 0  AHI Total Obs Mix Cen  51.29 Apnea 21.41 20.91 0.50 0.00   Hypopnea 29.88 -- -- --  45.81 Hypopnea (4%) 24.40 -- -- --    Total Supine Side Prone Upright  Position AHI 51.29 52.67 47.83 0.00 0.00  REM AHI 0.00   NREM AHI 51.29   Position RDI 51.29 52.67 47.83 0.00 0.00  REM RDI 0.00   NREM RDI 51.29    4% Hypopnea Total Supine Side Prone Upright  Position AHI (4%) 45.81 48.84 38.26 0.00 0.00  REM AHI (4%) 0.00   NREM AHI (4%) 45.81   Position RDI (4%) 45.81 48.84 38.26 0.00 0.00  REM RDI (4%) 0.00   NREM RDI (4%) 45.81    Desaturation Information Threshold: 2% <100% <90% <80% <70% <60% <50% <40%  Supine 255.0 168.0 4.0 0.0 0.0 0.0 0.0  Side 105.0 44.0 0.0 0.0 0.0 0.0 0.0  Prone 0.0 0.0 0.0 0.0 0.0 0.0 0.0  Upright 0.0 0.0 0.0 0.0 0.0 0.0 0.0  Total 360.0 212.0 4.0 0.0 0.0 0.0 0.0  Index 73.1 43.0 0.8 0.0 0.0 0.0 0.0   Threshold: 3% <100% <90% <80% <70% <60% <50% <40%  Supine 224.0 164.0 4.0 0.0 0.0 0.0 0.0  Side 79.0 44.0 0.0 0.0 0.0 0.0 0.0  Prone 0.0 0.0 0.0 0.0 0.0 0.0 0.0  Upright 0.0 0.0 0.0 0.0 0.0 0.0 0.0  Total 303.0 208.0 4.0 0.0 0.0 0.0 0.0  Index 61.5 42.2 0.8 0.0 0.0 0.0 0.0   Threshold: 4% <100% <90% <80% <70% <60% <50% <40%  Supine 189.0 154.0 4.0 0.0 0.0 0.0 0.0  Side 64.0 44.0 0.0 0.0 0.0 0.0 0.0  Prone 0.0 0.0 0.0 0.0 0.0 0.0 0.0  Upright 0.0 0.0 0.0 0.0 0.0 0.0 0.0  Total 253.0 198.0 4.0 0.0 0.0 0.0 0.0  Index 51.4 40.2 0.8 0.0 0.0 0.0 0.0   Threshold: 4% <100% <90% <80% <70% <60% <50% <40%  Supine 189 154 4 0 0 0 0  Side 64 44 0 0 0 0 0  Prone 0 0 0 0 0 0 0  Upright 0 0 0 0 0 0 0  Total 253 198 4 0 0 0 0   Awakening/Arousal Information # of Awakenings 26  Wake after sleep onset 60.65m Wake after persistent sleep 40.578m Arousal Assoc. Arousals Index  Apneas 30 7.5  Hypopneas 50 12.4  Leg Movements 6  1.5  Snore 0 0.0  PTT Arousals 0 0.0  Spontaneous 40 10.0  Total 123 30.6  Leg Movement Information PLMS LMs Index  Total LMs during PLMS 4 1.0  LMs w/ Microarousals 1 0.2   LM LMs Index  w/ Microarousal 5 1.2  w/ Awakening 9 2.2  w/ Resp Event 3 0.7  Spontaneous 20 5.0  Total 27 6.7     Desaturation threshold setting: 4% by CMS guidelines Minimum desaturation setting: 10 seconds SaO2 nadir: 61% The longest event was a 40 sec obstructive Hypopnea with a minimum SaO2 of 87%. The lowest SaO2 was 65% associated with a 30 sec obstructive Hypopnea. EKG Rates EKG Avg Max Min  Awake 81 99 64  Asleep 80 100 65  EKG Events: borderline Tachycardia

## 2022-06-02 NOTE — Addendum Note (Signed)
Addended by: Larey Seat on: 06/02/2022 01:21 PM   Modules accepted: Orders

## 2022-06-02 NOTE — Telephone Encounter (Signed)
-----   Message from Larey Seat, MD sent at 06/02/2022  1:21 PM EST ----- This patient has severe sleep apnea/ hypopnea and very broken sleep- borderline hypoxia, with loud snoring. She likely sleeps better in a recliner. CPAP is the  treatment of choice : see order specification.

## 2022-06-02 NOTE — Telephone Encounter (Signed)
I called pt's daughter. I advised pt that Dr. Brett Fairy reviewed their sleep study results and found that pt has severe sleep apnea. Dr. Brett Fairy recommends that pt starts auto CPAP. I reviewed PAP compliance expectations with the pt. Pt is agreeable to starting a CPAP. I advised pt that an order will be sent to a DME, [facility], and [facility] will call the pt within about one week after they file with the pt's insurance. [facility] will show the pt how to use the machine, fit for masks, and troubleshoot the CPAP if needed. A follow up appt was made for insurance purposes with Ward Givens, NP on 08/17/2022 at 9:30 am. Pt verbalized understanding to arrive 15 minutes early and bring their CPAP. Pt verbalized understanding of results. Pt had no questions at this time but was encouraged to call back if questions arise. I have sent the order to [facility] and have received confirmation that they have received the order.

## 2022-06-03 NOTE — Telephone Encounter (Signed)
Update: I see that on 03/22/22 the patient saw cardiology. The patient's plavix was stopped by cardiology and patient was to take Eliquis instead.

## 2022-06-07 ENCOUNTER — Ambulatory Visit (INDEPENDENT_AMBULATORY_CARE_PROVIDER_SITE_OTHER): Payer: Medicare Other

## 2022-06-07 DIAGNOSIS — R55 Syncope and collapse: Secondary | ICD-10-CM | POA: Diagnosis not present

## 2022-06-07 LAB — CUP PACEART REMOTE DEVICE CHECK
Date Time Interrogation Session: 20240219040925
Implantable Pulse Generator Implant Date: 20231116
Pulse Gen Serial Number: 511019978

## 2022-06-24 DIAGNOSIS — G4733 Obstructive sleep apnea (adult) (pediatric): Secondary | ICD-10-CM | POA: Diagnosis not present

## 2022-06-24 DIAGNOSIS — R400 Somnolence: Secondary | ICD-10-CM | POA: Diagnosis not present

## 2022-07-08 ENCOUNTER — Ambulatory Visit: Payer: Medicare Other

## 2022-07-21 NOTE — Progress Notes (Signed)
Merlin Loop Recorder 

## 2022-07-22 ENCOUNTER — Telehealth: Payer: Self-pay | Admitting: Family Medicine

## 2022-07-22 NOTE — Telephone Encounter (Signed)
[place] to schedule their annual wellness visit. Appointment made for 08/11/2022.  [place] Direct Dial: [phone]

## 2022-07-25 DIAGNOSIS — G4733 Obstructive sleep apnea (adult) (pediatric): Secondary | ICD-10-CM | POA: Diagnosis not present

## 2022-07-25 DIAGNOSIS — R400 Somnolence: Secondary | ICD-10-CM | POA: Diagnosis not present

## 2022-07-26 ENCOUNTER — Other Ambulatory Visit: Payer: Self-pay | Admitting: *Deleted

## 2022-07-26 DIAGNOSIS — D509 Iron deficiency anemia, unspecified: Secondary | ICD-10-CM

## 2022-07-26 MED ORDER — FERROUS SULFATE 325 (65 FE) MG PO TABS: 325.0000 mg | ORAL_TABLET | Freq: Every day | ORAL | 1 refills | Status: DC

## 2022-07-26 NOTE — Telephone Encounter (Signed)
Fax refill request, last filled on 11/16/21 #90 tabs/ 1 refill, [facility]   Last OV was a BP check appt on 03/17/22

## 2022-07-26 NOTE — Telephone Encounter (Signed)
Refilled for 6 mo

## 2022-07-27 ENCOUNTER — Encounter: Payer: Self-pay | Admitting: Adult Health

## 2022-07-27 ENCOUNTER — Ambulatory Visit: Payer: Medicare Other | Admitting: Adult Health

## 2022-07-27 VITALS — BP 180/98 | HR 74 | Ht 62.0 in | Wt 187.4 lb

## 2022-07-27 DIAGNOSIS — G4733 Obstructive sleep apnea (adult) (pediatric): Secondary | ICD-10-CM | POA: Diagnosis not present

## 2022-07-27 DIAGNOSIS — F01A0 Vascular dementia, mild, without behavioral disturbance, psychotic disturbance, mood disturbance, and anxiety: Secondary | ICD-10-CM | POA: Diagnosis not present

## 2022-07-27 DIAGNOSIS — Z8673 Personal history of transient ischemic attack (TIA), and cerebral infarction without residual deficits: Secondary | ICD-10-CM | POA: Diagnosis not present

## 2022-07-27 DIAGNOSIS — M542 Cervicalgia: Secondary | ICD-10-CM

## 2022-07-27 NOTE — Progress Notes (Signed)
PATIENT: Valerie FretHelen C Ramirez DOB: 1933-02-23  REASON FOR VISIT: follow up HISTORY FROM: patient PRIMARY NEUROLOGIST: Dr. Lucia GaskinsAhern  Chief Complaint  Patient presents with   Follow-up    Pt in 5 with daughter Pt here for memory f/u Daughter states memory is same since last visit Pt states has neck pain Pt states not sleeping well at night      HISTORY OF PRESENT ILLNESS: Today 07/27/22  Valerie FretHelen C Ramirez is a 89 y.o. female who has been followed in this office for stroke, neck pain, obstructive sleep apnea on CPAP and memory disturbance. Returns today for follow-up.   Neck pain: Continues to have pain in the neck that radiates to the back of the head. Does not want injections or other medications. Just wants to take tylenol.  Memory: Daughter lives with her. Able to complete all ADLs independently. No longer driving. Daughter manages medications. Continues on Aricpet 10 mg at bedtime.   Stroke: No stroke like symptoms. Taking Plavix. Loop recorder implanted.  Blood pressure is elevated today but they do check it at home.  OSA on CPAP: Compliance report attached to the night.  She does struggle with using the mask.  Leakage shown on CPAP report.  Daughter feels that the mask fits well  but the patient constantly adjusting it.  Does not want to do a mask refitting at this time    HISTORY [name] is a 89 year old pleasant Caucasian lady seen today for initial office consultation visit for stroke.  She is accompanied by her daughter Valerie BraunKaren.  History is obtained from them and review of electronic medical records.  I have personally reviewed pertinent available imaging films in PACS.  She has past medical history of hypertension, hyperlipidemia, coronary artery disease s/p stents, remote endometrial cancer, kidney stones, prior left PCA stroke who was recently admitted in July 2023.  Multifocal embolic strokes cryptogenic etiology in July 2023 with negative neurovascular work-up as well as  another admission 01/09/22 for altered mental status and slurred speech and found to have small right anterior thalamic and left coronary radiator lacunar infarcts.  MRI at that time also showed right occipital posterior parietal occipital infarct of remote age.  She underwent outpatient 30-day heart monitor from 01/17/2022 through 02/2022 which was negative for cardiac arrhythmia and is now scheduled to undergo loop recorder insertion later this week by Dr.Mesner cardiac electrophysiologist.  Transthoracic echo showed ejection fraction of 60 to 65% and TEE on 01/12/2022 showed no cardiac source of embolism, PFO or clot.  He is currently on Plavix 75 mg daily which is tolerating well without bleeding and only minor bruising.  LDL cholesterol on 01/06/2022 was 155 mg percent.  Hemoglobin A1c on 11/14/2021 was borderline at 6.2.  MR angiogram of the brain on 11/14/2021 showed moderate mid right MCA M1, moderate to severe bilateral PCA P2 and severe left PCA P3 stenosis. She has also noticed mild memory and cognitive difficulties for the last year or so.  She saw Dr. Revonda Humphreyayenne started on Aricept 5 mg which she is tolerating well without any GI or CNS side effects.  The daughter feels that her cognitive difficulties are unchanged and not progressing She also has chronic cervicogenic headaches for which she is being managed by Dr Lucia GaskinsAhern.  She has seen Dr. Cristopher PeruHemang Shah in [place] in the past but now prefers her care here in [place].  REVIEW OF SYSTEMS: Out of a complete 14 system review of symptoms, the patient complains only of  the following symptoms, and all other reviewed systems are negative.  ALLERGIES: Allergies  Allergen Reactions   Allopurinol Nausea Only   Lipitor [Atorvastatin Calcium] Itching and Other (See Comments)    Leg aches and aching all over   Morphine And Related Other (See Comments)    Makes the patient "loopy,"    Simvastatin Other (See Comments)    MUSCLE PAIN   Tape Itching, Rash and  Other (See Comments)    "Cannot use paper tape" --- also causes blisters. Use plastic tape    HOME MEDICATIONS: Outpatient Medications Prior to Visit  Medication Sig Dispense Refill   amoxicillin-clavulanate (AUGMENTIN) 875-125 MG tablet Take 1 tablet by mouth 2 (two) times daily. 14 tablet 0   apixaban (ELIQUIS) 2.5 MG TABS tablet Take 1 tablet (2.5 mg total) by mouth 2 (two) times daily. 180 tablet 3   Cholecalciferol (VITAMIN D-3) 25 MCG (1000 UT) CAPS Take 1,000 Units by mouth daily.     colchicine 0.6 MG tablet Take 1 tablet (0.6 mg total) by mouth daily. 90 tablet 1   Cyanocobalamin (VITAMIN B-12 PO) Take 1 tablet by mouth daily.     diclofenac Sodium (VOLTAREN ARTHRITIS PAIN) 1 % GEL Apply 4 g topically 4 (four) times daily. 100 g 11   donepezil (ARICEPT) 5 MG tablet Take 2 tablets (10 mg total) by mouth at bedtime. 60 tablet 6   DULoxetine (CYMBALTA) 30 MG capsule TAKE ONE CAPSULE BY MOUTH DAILY 90 capsule 3   ferrous sulfate (FEROSUL) 325 (65 FE) MG tablet Take 1 tablet (325 mg total) by mouth daily. 90 tablet 1   lidocaine (LIDODERM) 5 % Place 1 patch onto the skin daily. Remove & Discard patch within 12 hours or as directed by MD 30 patch 11   losartan (COZAAR) 25 MG tablet Take 1 tablet (25 mg total) by mouth daily. 90 tablet 3   Magnesium Chloride (MAG-SR PLUS CALCIUM PO) Take by mouth. Taking 1 tab once for dizziness     multivitamin-iron-minerals-folic acid (CENTRUM) chewable tablet Chew 1 tablet by mouth daily. 90 tablet 0   nitroGLYCERIN (NITROSTAT) 0.4 MG SL tablet Place 1 tablet (0.4 mg total) under the tongue every 5 (five) minutes x 3 doses as needed for chest pain. 25 tablet 12   rosuvastatin (CRESTOR) 20 MG tablet Take 20 mg by mouth at bedtime.     No facility-administered medications prior to visit.    PAST MEDICAL HISTORY: Past Medical History:  Diagnosis Date   Arthritis    OA   CAD (coronary artery disease)    stent December 2018   Cancer [facility])     endometrial   GERD (gastroesophageal reflux disease)    Hearing loss    History of kidney stones    Hyperlipidemia    Hypertension    Hypothyroid    "not in years"   Shortness of breath dyspnea    04/25/17- not anymore    PAST SURGICAL HISTORY: Past Surgical History:  Procedure Laterality Date   ABDOMINAL HYSTERECTOMY     ANTERIOR CERVICAL DECOMP/DISCECTOMY FUSION [name] 08/18/2015   Procedure: C5-6, C6-7 Anterior Cervical Discectomy and Fusion, Allograft, Plate;  Surgeon: Eldred Manges, MD;  Location: [facility] OR;  Service: Orthopedics;  Laterality: [name];   bone spur removed Right    shoulder   BOWEL RESECTION  07/04/2012   Procedure: SMALL BOWEL RESECTION;  Surgeon: Jeannette Corpus, MD;  Location: [facility] ORS;  Service: Gynecology;;   BREAST BIOPSY  CHOLECYSTECTOMY     CORONARY STENT INTERVENTION [name] 04/06/2017   Procedure: CORONARY STENT INTERVENTION;  Surgeon: Iran Ouch, MD;  Location: [facility] INVASIVE CV LAB;  Service: Cardiovascular;  Laterality: [name];   DILATION AND CURETTAGE OF UTERUS  1999   EYE SURGERY Bilateral    cataracts   FALSE ANEURYSM REPAIR Right 04/26/2017   Procedure: 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;  Surgeon: Fransisco Hertz, MD;  Location: [facility] OR;  Service: Vascular;  Laterality: Right;   HAND SURGERY  12/2008   after hand fx/due to fall   JOINT REPLACEMENT Bilateral 07/1998   knees   KNEE ARTHROPLASTY  05/23/1998   total right   LAPAROTOMY Bilateral 07/04/2012   Procedure: EXPLORATORY LAPAROTOMY TOTAL ABDOMINAL HYSTERECTOMY BILATERAL SALPINGO-OOPHORECTOMY with small bowel resection ;  Surgeon: Jeannette Corpus, MD;  Location: [facility] ORS;  Service: Gynecology;  Laterality: Bilateral;   LEFT HEART CATH AND CORONARY ANGIOGRAPHY [name] 04/06/2017   Procedure: LEFT HEART CATH AND CORONARY ANGIOGRAPHY;  Surgeon: Dolores Patty, MD;  Location: [facility] INV[facility] LAB;  Service: Cardiovascular;  Laterality: [name];   polyp removal  1999   TEE WITHOUT  CARDIOVERSION [name] 01/12/2022   Procedure: TRANSESOPHAGEAL ECHOCARDIOGRAM (TEE);  Surgeon: Antonieta Iba, MD;  Location: [facility] ORS;  Service: Cardiovascular;  Laterality: [name];   TUBAL LIGATION      FAMILY HISTORY: Family History  Problem Relation Age of Onset   Heart disease Father    Heart attack Father    Stroke Father    Heart disease Sister        CABG   Stroke Sister    Cancer Sister    Bladder Cancer Sister    Stroke Sister        "massive brain bleed"   Leukemia Brother    Lung cancer Brother    Leukemia Brother    Stomach cancer Brother    Heart disease Brother        CABG    SOCIAL HISTORY: Social History   Socioeconomic History   Marital status: Widowed    Spouse name: Not on file   Number of children: 2   Years of education: Not on file   Highest education level: High school graduate  Occupational History   Not on file  Tobacco Use   Smoking status: Never   Smokeless tobacco: Never  Vaping Use   Vaping Use: Never used  Substance and Sexual Activity   Alcohol use: Never    Alcohol/week: 0.0 standard drinks of alcohol   Drug use: Never   Sexual activity: Never    Birth control/protection: Post-menopausal  Other Topics Concern   Not on file  Social History Narrative   Not on file   Social Determinants of Health   Financial Resource Strain: Low Risk  (07/16/2021)   Overall Financial Resource Strain (CARDIA)    Difficulty of Paying Living Expenses: Not hard at all  Food Insecurity: No Food Insecurity (01/09/2022)   Hunger Vital Sign    Worried About Running Out of Food in the Last Year: Never true    Ran Out of Food in the Last Year: Never true  Transportation Needs: No Transportation Needs (01/09/2022)   PRAPARE - Administrator, Civil Service (Medical): No    Lack of Transportation (Non-Medical): No  Physical Activity: Insufficiently Active (07/16/2021)   Exercise Vital Sign    Days of Exercise per Week: 4 days    Minutes of  Exercise per  Session: 30 min  Stress: No Stress Concern Present (07/16/2021)   [employer] of Occupational Health - Occupational Stress Questionnaire    Feeling of Stress : Not at all  Social Connections: Moderately Isolated (07/16/2021)   Social Connection and Isolation Panel [NHANES]    Frequency of Communication with Friends and Family: More than three times a week    Frequency of Social Gatherings with Friends and Family: Twice a week    Attends Religious Services: More than 4 times per year    Active Member of [employer] or Organizations: No    Attends Banker Meetings: Never    Marital Status: Widowed  Intimate Partner Violence: Not At Risk (01/09/2022)   Humiliation, Afraid, Rape, and Kick questionnaire    Fear of Current or Ex-Partner: No    Emotionally Abused: No    Physically Abused: No    Sexually Abused: No      PHYSICAL EXAM  Vitals:   07/27/22 0935  BP: (!) 180/90  Pulse: 74  Weight: 187 lb 6.4 oz (85 kg)  Height: 5\' 2"  (1.575 m)   Body mass index is 34.28 kg/m.     07/27/2022    9:43 AM 03/02/2022    2:18 PM 01/06/2022   12:26 PM  MMSE - Mini Mental State Exam  Orientation to time 4 5 4   Orientation to Place 3 4 4   Registration 3 3 3   Attention/ Calculation 0 1 1  Recall 1 3 3   Language- name 2 objects 2 2 2   Language- repeat 0 0 1  Language- follow 3 step command 2 3 2   Language- read & follow direction 1 1 1   Write a sentence 0 1 1  Copy design 0 1 0  Total score 16 24 22       Generalized: Well developed, in no acute distress   Neurological examination  Mentation: Alert oriented to time, place, history taking. Follows all commands speech and language fluent Cranial nerve II-XII: Pupils were equal round reactive to light. Extraocular movements were full, visual field were full on confrontational test. Facial sensation and strength were normal. Uvula tongue midline. Head turning and shoulder shrug  were normal and symmetric. Motor:  The motor testing reveals 5 over 5 strength of all 4 extremities. Good symmetric motor tone is noted throughout.  Sensory: Sensory testing is intact to soft touch on all 4 extremities. No evidence of extinction is noted.  Coordination: Cerebellar testing reveals good finger-nose-finger and heel-to-shin bilaterally.  Gait and station: Gait is wide-based.   DIAGNOSTIC DATA (LABS, IMAGING, TESTING) - I reviewed patient records, labs, notes, testing and imaging myself where available.  Lab Results  Component Value Date   WBC 8.3 01/09/2022   HGB 12.5 01/09/2022   HCT 38.8 01/09/2022   MCV 94.4 01/09/2022   PLT 169 01/09/2022      Component Value Date/Time   NA 139 01/09/2022 1104   NA 140 02/09/2021 1202   K 4.6 01/09/2022 1104   CL 105 01/09/2022 1104   CO2 25 01/09/2022 1104   GLUCOSE 152 (H) 01/09/2022 1104   BUN 29 (H) 01/09/2022 1104   BUN 27 02/09/2021 1202   CREATININE 1.51 (H) 01/09/2022 1802   CALCIUM 9.4 01/09/2022 1104   PROT 7.5 01/09/2022 1104   PROT 6.8 02/09/2021 1202   ALBUMIN 3.9 01/09/2022 1104   ALBUMIN 4.2 02/09/2021 1202   AST 20 01/09/2022 1104   ALT 16 01/09/2022 1104   ALKPHOS 109 01/09/2022 1104  BILITOT 0.9 01/09/2022 1104   BILITOT 0.3 02/09/2021 1202   GFRNONAA 33 (L) 01/09/2022 1802   GFRAA 35 (L) 05/01/2020 1156   Lab Results  Component Value Date   CHOL 130 01/10/2022   HDL 52 01/10/2022   LDLCALC 53 01/10/2022   LDLDIRECT 136.0 10/31/2015   TRIG 125 01/10/2022   CHOLHDL 2.5 01/10/2022   Lab Results  Component Value Date   HGBA1C 6.2 (H) 11/14/2021   Lab Results  Component Value Date   VITAMINB12 >2000 (H) 03/02/2022   Lab Results  Component Value Date   TSH 1.690 03/02/2022      ASSESSMENT AND PLAN 89 y.o. year old female  has a past medical history of Arthritis, CAD (coronary artery disease), Cancer (HCC), GERD (gastroesophageal reflux disease), Hearing loss, History of kidney stones, Hyperlipidemia, Hypertension,  Hypothyroid, and Shortness of breath dyspnea. here with:  History of stroke   Continue clopidogrel 75 mg daily for secondary stroke prevention.  Discussed secondary stroke prevention measures and importance of close PCP follow up for aggressive stroke risk factor management. I have gone over the pathophysiology of stroke, warning signs and symptoms, risk factors and their management in some detail with instructions to go to the closest emergency room for symptoms of concern. HTN: BP goal <130/90.  Elevated today.  The patient will recheck it at home.  If it remains elevated she will make her PCP aware HLD: LDL goal <70. R DMII: A1c goal<7.0.   2.  Neck pain  Discussed options such as injections, gabapentin or Lyrica however the patient deferred.  3.  Memory disturbance  MMSE 16/30  Continue Aricept 10 mg at bedtime  4.  Obstructive sleep apnea on CPAP  Report shows good compliance Good treatment of apnea Encouraged patient to use a CPAP nightly greater than 4 hours each night Discussed the mask refitting but they deferred for now   Follow-up in 6-7 months or sooner if needed       Butch Penny, MSN, NP-C 07/27/2022, 9:32 AM [facility] [address], [address] [place], [place] [address] [phone]

## 2022-08-01 ENCOUNTER — Other Ambulatory Visit: Payer: Self-pay | Admitting: Neurology

## 2022-08-09 ENCOUNTER — Ambulatory Visit: Payer: Medicare Other

## 2022-08-10 ENCOUNTER — Telehealth: Payer: Self-pay | Admitting: Family Medicine

## 2022-08-10 NOTE — Telephone Encounter (Signed)
Patient requested document Handicap Placard, to be filled out by provider. Patient requested to send it via Call Patient to pick up within ASAP. Please advise at Mobile [phone] (mobile). Patient did not drop this form off, requests that Dr. Milinda Antis fill this form out in office and call her daughter (ok per DPR) to pick up when complete.

## 2022-08-10 NOTE — Telephone Encounter (Signed)
Form placed in your inbox

## 2022-08-11 ENCOUNTER — Ambulatory Visit (INDEPENDENT_AMBULATORY_CARE_PROVIDER_SITE_OTHER): Payer: Medicare Other

## 2022-08-11 VITALS — Ht 61.0 in | Wt 180.0 lb

## 2022-08-11 DIAGNOSIS — Z0000 Encounter for general adult medical examination without abnormal findings: Secondary | ICD-10-CM

## 2022-08-11 NOTE — Telephone Encounter (Signed)
Done and in IN box 

## 2022-08-11 NOTE — Patient Instructions (Signed)
Valerie Ramirez , Thank you for taking time to come for your Medicare Wellness Visit. I appreciate your ongoing commitment to your health goals. Please review the following plan we discussed and let me know if I can assist you in the future.   These are the goals we discussed:  Goals      DIET - INCREASE WATER INTAKE     Starting 07/27/2017, I will continue to drink 6-8 glasses of water daily.      Increase water intake     Starting 07/26/2016, I will continue to drink at 6-8 glasses of water daily.      Patient Stated     No goals       Patient Stated     No new goals        This is a list of the screening recommended for you and due dates:  Health Maintenance  Topic Date Due   Zoster (Shingles) Vaccine (1 of 2) Never done   COVID-19 Vaccine (3 - Pfizer risk series) 09/03/2019   Mammogram  12/17/2028*   Flu Shot  11/18/2022   Medicare Annual Wellness Visit  08/11/2023   DTaP/Tdap/Td vaccine (4 - Tdap) 03/17/2032   Pneumonia Vaccine  Completed   DEXA scan (bone density measurement)  Completed   HPV Vaccine  Aged Out  *Topic was postponed. The date shown is not the original due date.    Advanced directives: Please bring a copy of your health care power of attorney and living will to the office to be added to your chart at your convenience.   Conditions/risks identified: Aim for 30 minutes of exercise or brisk walking, 6-8 glasses of water, and 5 servings of fruits and vegetables each day.   Next appointment: Follow up in one year for your annual wellness visit 08/16/23 @ 9:30 via telephone   Preventive Care 65 Years and Older, Female Preventive care refers to lifestyle choices and visits with your health care provider that can promote health and wellness. What does preventive care include? A yearly physical exam. This is also called an annual well check. Dental exams once or twice a year. Routine eye exams. Ask your health care provider how often you should have your eyes  checked. Personal lifestyle choices, including: Daily care of your teeth and gums. Regular physical activity. Eating a healthy diet. Avoiding tobacco and drug use. Limiting alcohol use. Practicing safe sex. Taking low-dose aspirin every day. Taking vitamin and mineral supplements as recommended by your health care provider. What happens during an annual well check? The services and screenings done by your health care provider during your annual well check will depend on your age, overall health, lifestyle risk factors, and family history of disease. Counseling  Your health care provider may ask you questions about your: Alcohol use. Tobacco use. Drug use. Emotional well-being. Home and relationship well-being. Sexual activity. Eating habits. History of falls. Memory and ability to understand (cognition). Work and work Astronomer. Reproductive health. Screening  You may have the following tests or measurements: Height, weight, and BMI. Blood pressure. Lipid and cholesterol levels. These may be checked every 5 years, or more frequently if you are over 10 years old. Skin check. Lung cancer screening. You may have this screening every year starting at age 63 if you have a 30-pack-year history of smoking and currently smoke or have quit within the past 15 years. Fecal occult blood test (FOBT) of the stool. You may have this test every year starting  at age 58. Flexible sigmoidoscopy or colonoscopy. You may have a sigmoidoscopy every 5 years or a colonoscopy every 10 years starting at age 74. Hepatitis C blood test. Hepatitis B blood test. Sexually transmitted disease (STD) testing. Diabetes screening. This is done by checking your blood sugar (glucose) after you have not eaten for a while (fasting). You may have this done every 1-3 years. Bone density scan. This is done to screen for osteoporosis. You may have this done starting at age 35. Mammogram. This may be done every 1-2  years. Talk to your health care provider about how often you should have regular mammograms. Talk with your health care provider about your test results, treatment options, and if necessary, the need for more tests. Vaccines  Your health care provider may recommend certain vaccines, such as: Influenza vaccine. This is recommended every year. Tetanus, diphtheria, and acellular pertussis (Tdap, Td) vaccine. You may need a Td booster every 10 years. Zoster vaccine. You may need this after age 61. Pneumococcal 13-valent conjugate (PCV13) vaccine. One dose is recommended after age 56. Pneumococcal polysaccharide (PPSV23) vaccine. One dose is recommended after age 70. Talk to your health care provider about which screenings and vaccines you need and how often you need them. This information is not intended to replace advice given to you by your health care provider. Make sure you discuss any questions you have with your health care provider. Document Released: 05/02/2015 Document Revised: 12/24/2015 Document Reviewed: 02/04/2015 Elsevier Interactive Patient Education  2017 [place] Prevention in the Home Falls can cause injuries. They can happen to people of all ages. There are many things you can do to make your home safe and to help prevent falls. What can I do on the outside of my home? Regularly fix the edges of walkways and driveways and fix any cracks. Remove anything that might make you trip as you walk through a door, such as a raised step or threshold. Trim any bushes or trees on the path to your home. Use bright outdoor lighting. Clear any walking paths of anything that might make someone trip, such as rocks or tools. Regularly check to see if handrails are loose or broken. Make sure that both sides of any steps have handrails. Any raised decks and porches should have guardrails on the edges. Have any leaves, snow, or ice cleared regularly. Use sand or salt on walking paths  during winter. Clean up any spills in your garage right away. This includes oil or grease spills. What can I do in the bathroom? Use night lights. Install grab bars by the toilet and in the tub and shower. Do not use towel bars as grab bars. Use non-skid mats or decals in the tub or shower. If you need to sit down in the shower, use a plastic, non-slip stool. Keep the floor dry. Clean up any water that spills on the floor as soon as it happens. Remove soap buildup in the tub or shower regularly. Attach bath mats securely with double-sided non-slip rug tape. Do not have throw rugs and other things on the floor that can make you trip. What can I do in the bedroom? Use night lights. Make sure that you have a light by your bed that is easy to reach. Do not use any sheets or blankets that are too big for your bed. They should not hang down onto the floor. Have a firm chair that has side arms. You can use this for support  while you get dressed. Do not have throw rugs and other things on the floor that can make you trip. What can I do in the kitchen? Clean up any spills right away. Avoid walking on wet floors. Keep items that you use a lot in easy-to-reach places. If you need to reach something above you, use a strong step stool that has a grab bar. Keep electrical cords out of the way. Do not use floor polish or wax that makes floors slippery. If you must use wax, use non-skid floor wax. Do not have throw rugs and other things on the floor that can make you trip. What can I do with my stairs? Do not leave any items on the stairs. Make sure that there are handrails on both sides of the stairs and use them. Fix handrails that are broken or loose. Make sure that handrails are as long as the stairways. Check any carpeting to make sure that it is firmly attached to the stairs. Fix any carpet that is loose or worn. Avoid having throw rugs at the top or bottom of the stairs. If you do have throw  rugs, attach them to the floor with carpet tape. Make sure that you have a light switch at the top of the stairs and the bottom of the stairs. If you do not have them, ask someone to add them for you. What else can I do to help prevent falls? Wear shoes that: Do not have high heels. Have rubber bottoms. Are comfortable and fit you well. Are closed at the toe. Do not wear sandals. If you use a stepladder: Make sure that it is fully opened. Do not climb a closed stepladder. Make sure that both sides of the stepladder are locked into place. Ask someone to hold it for you, if possible. Clearly mark and make sure that you can see: Any grab bars or handrails. First and last steps. Where the edge of each step is. Use tools that help you move around (mobility aids) if they are needed. These include: Canes. Walkers. Scooters. Crutches. Turn on the lights when you go into a dark area. Replace any light bulbs as soon as they burn out. Set up your furniture so you have a clear path. Avoid moving your furniture around. If any of your floors are uneven, fix them. If there are any pets around you, be aware of where they are. Review your medicines with your doctor. Some medicines can make you feel dizzy. This can increase your chance of falling. Ask your doctor what other things that you can do to help prevent falls. This information is not intended to replace advice given to you by your health care provider. Make sure you discuss any questions you have with your health care provider. Document Released: 01/30/2009 Document Revised: 09/11/2015 Document Reviewed: 05/10/2014 Elsevier Interactive Patient Education  2017 [employer].

## 2022-08-11 NOTE — Telephone Encounter (Signed)
Daughter notified form ready for pick up 

## 2022-08-11 NOTE — Progress Notes (Signed)
I connected with  Valerie Ramirez and [name] 08/11/22 by a audio enabled telemedicine application and verified that I am speaking with the correct person using two identifiers.  Patient Location: Home  Provider Location: Office/Clinic  I discussed the limitations of evaluation and management by telemedicine. The patient expressed understanding and agreed to proceed.  Subjective:   Valerie Ramirez is a 89 y.o. female who presents for Medicare Annual (Subsequent) preventive examination.  Review of Systems      Cardiac Risk Factors include: advanced age (>59men, >107 women);hypertension     Objective:    Today's Vitals   08/11/22 0928  Weight: 180 lb (81.6 kg)  Height: 5\' 1"  (1.549 m)   Body mass index is 34.01 kg/m.     08/11/2022    9:39 AM 01/12/2022    7:31 AM 01/10/2022    7:45 PM 01/09/2022   11:01 AM 11/13/2021    7:50 PM 07/16/2021   11:39 AM 02/10/2021   10:11 AM  Advanced Directives  Does Patient Have a Medical Advance Directive? Yes Yes Yes Yes Yes No Yes  Type of Estate agent of [place];Living will Living will Living will Healthcare Power of [place];Living will;Out of facility DNR (pink MOST or yellow form) Living will;Out of facility DNR (pink MOST or yellow form)  Healthcare Power of [place];Living will  Does patient want to make changes to medical advance directive?  No - Patient declined No - Patient declined  No - Patient declined    Copy of Healthcare Power of Attorney in Chart? No - copy requested        Would patient like information on creating a medical advance directive?      No - Patient declined     Current Medications (verified) Outpatient Encounter Medications as of 08/11/2022  Medication Sig   apixaban (ELIQUIS) 2.5 MG TABS tablet Take 1 tablet (2.5 mg total) by mouth 2 (two) times daily.   Cholecalciferol (VITAMIN D-3) 25 MCG (1000 UT) CAPS Take 1,000 Units by mouth daily.   colchicine 0.6 MG tablet Take 1 tablet  (0.6 mg total) by mouth daily.   Cyanocobalamin (VITAMIN B-12 PO) Take 1 tablet by mouth daily.   diclofenac Sodium (VOLTAREN ARTHRITIS PAIN) 1 % GEL Apply 4 g topically 4 (four) times daily.   donepezil (ARICEPT) 5 MG tablet TAKE 1 TABLET BY MOUTH AT BEDTIME   DULoxetine (CYMBALTA) 30 MG capsule TAKE ONE CAPSULE BY MOUTH DAILY   ferrous sulfate (FEROSUL) 325 (65 FE) MG tablet Take 1 tablet (325 mg total) by mouth daily.   lidocaine (LIDODERM) 5 % Place 1 patch onto the skin daily. Remove & Discard patch within 12 hours or as directed by MD   losartan (COZAAR) 25 MG tablet Take 1 tablet (25 mg total) by mouth daily.   Magnesium Chloride (MAG-SR PLUS CALCIUM PO) Take by mouth. Taking 1 tab once for dizziness   nitroGLYCERIN (NITROSTAT) 0.4 MG SL tablet Place 1 tablet (0.4 mg total) under the tongue every 5 (five) minutes x 3 doses as needed for chest pain.   rosuvastatin (CRESTOR) 20 MG tablet Take 20 mg by mouth at bedtime.   amoxicillin-clavulanate (AUGMENTIN) 875-125 MG tablet Take 1 tablet by mouth 2 (two) times daily. (Patient not taking: Reported on 08/11/2022)   multivitamin-iron-minerals-folic acid (CENTRUM) chewable tablet Chew 1 tablet by mouth daily. (Patient not taking: Reported on 08/11/2022)   No facility-administered encounter medications on file as of 08/11/2022.    Allergies (  verified) Allopurinol, Lipitor [atorvastatin calcium], Morphine and related, Simvastatin, and Tape   History: Past Medical History:  Diagnosis Date   Arthritis    OA   CAD (coronary artery disease)    stent December 2018   Cancer    endometrial   GERD (gastroesophageal reflux disease)    Hearing loss    History of kidney stones    Hyperlipidemia    Hypertension    Hypothyroid    "not in years"   Shortness of breath dyspnea    04/25/17- not anymore   Past Surgical History:  Procedure Laterality Date   ABDOMINAL HYSTERECTOMY     ANTERIOR CERVICAL DECOMP/DISCECTOMY FUSION [name] 08/18/2015    Procedure: C5-6, C6-7 Anterior Cervical Discectomy and Fusion, Allograft, Plate;  Surgeon: Eldred Manges, MD;  Location: [facility] OR;  Service: Orthopedics;  Laterality: [name];   bone spur removed Right    shoulder   BOWEL RESECTION  07/04/2012   Procedure: SMALL BOWEL RESECTION;  Surgeon: Jeannette Corpus, MD;  Location: [facility] ORS;  Service: Gynecology;;   BREAST BIOPSY     CHOLECYSTECTOMY     CORONARY STENT INTERVENTION [name] 04/06/2017   Procedure: CORONARY STENT INTERVENTION;  Surgeon: Iran Ouch, MD;  Location: [facility] INVASIVE CV [facility];  Service: Cardiovascular;  Laterality: [name];   DILATION AND CURETTAGE OF UTERUS  1999   EYE SURGERY Bilateral    cataracts   FALSE ANEURYSM REPAIR Right 04/26/2017   Procedure: 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;  Surgeon: Fransisco Hertz, MD;  Location: [facility];  Service: Vascular;  Laterality: Right;   HAND SURGERY  12/2008   after hand fx/due to fall   JOINT REPLACEMENT Bilateral 07/1998   knees   KNEE ARTHROPLASTY  05/23/1998   total right   LAPAROTOMY Bilateral 07/04/2012   Procedure: EXPLORATORY LAPAROTOMY TOTAL ABDOMINAL HYSTERECTOMY BILATERAL SALPINGO-OOPHORECTOMY with small bowel resection ;  Surgeon: Jeannette Corpus, MD;  Location: [facility] ORS;  Service: Gynecology;  Laterality: Bilateral;   LEFT HEART CATH AND CORONARY ANGIOGRAPHY [name] 04/06/2017   Procedure: LEFT HEART CATH AND CORONARY ANGIOGRAPHY;  Surgeon: Dolores Patty, MD;  Location: [facility] INV[facility];  Service: Cardiovascular;  Laterality: [name];   polyp removal  1999   TEE WITHOUT CARDIOVERSION [name] 01/12/2022   Procedure: TRANSESOPHAGEAL ECHOCARDIOGRAM (TEE);  Surgeon: Antonieta Iba, MD;  Location: [facility] ORS;  Service: Cardiovascular;  Laterality: [name];   TUBAL LIGATION     Family History  Problem Relation Age of Onset   Heart disease Father    Heart attack Father    Stroke Father    Heart disease Sister        CABG   Stroke Sister    Cancer Sister    Bladder Cancer  Sister    Stroke Sister        "massive brain bleed"   Leukemia Brother    Lung cancer Brother    Leukemia Brother    Stomach cancer Brother    Heart disease Brother        CABG   Dementia Neg Hx    Social History   Socioeconomic History   Marital status: Widowed    Spouse name: Not on file   Number of children: 2   Years of education: Not on file   Highest education level: High school graduate  Occupational History   Not on file  Tobacco Use   Smoking status: Never   Smokeless tobacco: Never  Vaping Use  Vaping Use: Never used  Substance and Sexual Activity   Alcohol use: Never    Alcohol/week: 0.0 standard drinks of alcohol   Drug use: Never   Sexual activity: Never    Birth control/protection: Post-menopausal  Other Topics Concern   Not on file  Social History Narrative   Not on file   Social Determinants of Health   Financial Resource Strain: Low Risk  (08/11/2022)   Overall Financial Resource Strain (CARDIA)    Difficulty of Paying Living Expenses: Not hard at all  Food Insecurity: No Food Insecurity (08/11/2022)   Hunger Vital Sign    Worried About Running Out of Food in the Last Year: Never true    Ran Out of Food in the Last Year: Never true  Transportation Needs: No Transportation Needs (08/11/2022)   PRAPARE - Administrator, Civil Service (Medical): No    Lack of Transportation (Non-Medical): No  Physical Activity: Sufficiently Active (08/11/2022)   Exercise Vital Sign    Days of Exercise per Week: 7 days    Minutes of Exercise per Session: 30 min  Stress: No Stress Concern Present (08/11/2022)   [employer] of Occupational Health - Occupational Stress Questionnaire    Feeling of Stress : Not at all  Social Connections: Moderately Isolated (08/11/2022)   Social Connection and Isolation Panel [NHANES]    Frequency of Communication with Friends and Family: More than three times a week    Frequency of Social Gatherings with Friends and  Family: Twice a week    Attends Religious Services: More than 4 times per year    Active Member of [employer] or Organizations: No    Attends Banker Meetings: Never    Marital Status: Widowed    Tobacco Counseling Counseling given: Not Answered   Clinical Intake:  Pre-visit preparation completed: Yes  Pain : No/denies pain     Nutritional Risks: None Diabetes: No  How often do you need to have someone help you when you read instructions, pamphlets, or other written materials from your doctor or pharmacy?: 1 - Never  Diabetic? no  Interpreter Needed?: No  Information entered by :: C.Froylan Hobby LPN   Activities of Daily Living    08/11/2022    9:40 AM 01/09/2022    7:00 PM  In your present state of health, do you have any difficulty performing the following activities:  Hearing? 0 1  Vision? 0 0  Difficulty concentrating or making decisions? 1 0  Comment occasionally forgets   Walking or climbing stairs? 0 1  Dressing or bathing? 0 0  Doing errands, shopping? 1 0  Comment Family Advice worker and eating ? N   Using the Toilet? N   In the past six months, have you accidently leaked urine? Y   Comment wears depends   Do you have problems with loss of bowel control? N   Managing your Medications? N   Managing your Finances? N   Housekeeping or managing your Housekeeping? N     Patient Care Team: Tower, Audrie Gallus, MD as PCP - General (Family Medicine) [place], Peter M, MD as PCP - Cardiology (Cardiology) Mealor, Roberts Gaudy, MD as PCP - Electrophysiology (Cardiology) Anson Fret, MD as Consulting Physician (Neurology)  Indicate any recent Medical Services you may have received from other than [facility] providers in the past year (date may be approximate).     Assessment:   This is a routine wellness examination for [place].  Hearing/Vision screen Hearing Screening - Comments:: aids Vision Screening - Comments:: Glasses - Dr.Bell  Dietary  issues and exercise activities discussed: Current Exercise Habits: Home exercise routine, Type of exercise: walking, Time (Minutes): 30, Frequency (Times/Week): 7, Weekly Exercise (Minutes/Week): 210, Exercise limited by: None identified   Goals Addressed             This Visit's Progress    Patient Stated       No new goals       Depression Screen    08/11/2022    9:38 AM 12/18/2018   10:14 AM 07/27/2017   11:25 AM 07/26/2016   10:46 AM 08/12/2014    9:01 AM 08/10/2013   12:27 PM 08/08/2012   10:56 AM  PHQ 2/9 Scores  PHQ - 2 Score 0 0 0 0 0 0 0  PHQ- 9 Score   0        Fall Risk    08/11/2022    9:40 AM 11/17/2021   12:00 PM 07/16/2021   11:44 AM 06/24/2021    3:08 PM 03/14/2019    5:29 PM  Fall Risk   Falls in the past year? 0 0  Comment     Emmi Telephone Survey: data to providers prior to load  Number falls in past yr: 0 0 1 1   Injury with Fall? 0 Risk for fall due to : No Fall Risks  Impaired balance/gait    Follow up Falls prevention discussed;Falls evaluation completed Falls evaluation completed Falls evaluation completed;Education provided;Falls prevention discussed      FALL RISK PREVENTION PERTAINING TO THE HOME:  Any stairs in or around the home? Yes  If so, are there any without handrails? No  Home free of loose throw rugs in walkways, pet beds, electrical cords, etc? Yes  Adequate lighting in your home to reduce risk of falls? Yes   ASSISTIVE DEVICES UTILIZED TO PREVENT FALLS:  Life alert? Yes  Use of a cane, walker or w/c? Yes  Grab bars in the bathroom? Yes  Shower chair or bench in shower? Yes  Elevated toilet seat or a handicapped toilet? No    Cognitive Function:    07/27/2022    9:43 AM 03/02/2022    2:18 PM 01/06/2022   12:26 PM 07/27/2017   11:25 AM 07/26/2016   10:46 AM  MMSE - Mini Mental State Exam  Orientation to time Orientation to Place Registration Attention/ Calculation 0 1 1 0 0   Recall Language- name 2 objects 0 0  Language- repeat 0 0 Language- follow 3 step command Language- read & follow direction 0 0  Write a sentence 0 1 1 0 0  Copy design 0 1 0 0 0  Total score 08/11/2022    9:42 AM  6CIT Screen  What Year? 0 points  What month? 0 points  What time? 0 points  Count back from 20 0 points  Months in reverse 0 points  Repeat phrase 0 points  Total Score 0 points    Immunizations Immunization History  Administered Date(s) Administered   Fluad Quad(high Dose 65+) 02/16/2021, 01/12/2022   Influenza Split 04/01/2011  Influenza Whole 01/20/2012   Influenza, High Dose Seasonal PF 01/29/2016, 01/05/2018   Influenza,inj,Quad PF,6+ Mos 05/16/2015   Influenza-Unspecified 02/15/2013, 02/28/2014, 03/03/2017   PFIZER(Purple Top)SARS-COV-2 Vaccination 07/12/2019, 08/06/2019   Pneumococcal Conjugate-13 08/12/2014   Pneumococcal Polysaccharide-23 07/07/2012   Td 05/14/1997, 11/02/2011, 03/17/2022    TDAP status: Up to date  Flu Vaccine status: Up to date  Pneumococcal vaccine status: Up to date  Covid-19 vaccine status: Information provided on how to obtain vaccines.   Qualifies for Shingles Vaccine? Yes   Zostavax completed No   Shingrix Completed?: No.    Education has been provided regarding the importance of this vaccine. Patient has been advised to call insurance company to determine out of pocket expense if they have not yet received this vaccine. Advised may also receive vaccine at local pharmacy or Health Dept. Verbalized acceptance and understanding.  Screening Tests Health Maintenance  Topic Date Due   Zoster Vaccines- Shingrix (1 of 2) Never done   COVID-19 Vaccine (3 - Pfizer risk series) 09/03/2019   MAMMOGRAM  12/17/2028 (Originally 10/05/2014)   INFLUENZA VACCINE  11/18/2022   Medicare Annual Wellness (AWV)  08/11/2023   DTaP/Tdap/Td (4 - Tdap) 03/17/2032   Pneumonia  Vaccine 72+ Years old  Completed   DEXA SCAN  Completed   HPV VACCINES  Aged Out    Health Maintenance  Health Maintenance Due  Topic Date Due   Zoster Vaccines- Shingrix (1 of 2) Never done   COVID-19 Vaccine (3 - Pfizer risk series) 09/03/2019    Colorectal cancer screening: No longer required.   Mammogram status: No longer required due to age.  Bone Density - Declined  Lung Cancer Screening: (Low Dose CT Chest recommended if Age 18-80 years, 30 pack-year currently smoking OR have quit w/in 15years.) does not qualify.   Lung Cancer Screening Referral: no  Additional Screening:  Hepatitis C Screening: does not qualify; Completed no  Vision Screening: Recommended annual ophthalmology exams for early detection of glaucoma and other disorders of the eye. Is the patient up to date with their annual eye exam?  Yes  Who is the provider or what is the name of the office in which the patient attends annual eye exams? Dr.Bell If pt is not established with a provider, would they like to be referred to a provider to establish care? No .   Dental Screening: Recommended annual dental exams for proper oral hygiene  Community Resource Referral / Chronic Care Management: CRR required this visit?  No   CCM required this visit?  No      Plan:     I have personally reviewed and noted the following in the patient's chart:   Medical and social history Use of alcohol, tobacco or illicit drugs  Current medications and supplements including opioid prescriptions. Patient is not currently taking opioid prescriptions. Functional ability and status Nutritional status Physical activity Advanced directives List of other physicians Hospitalizations, surgeries, and ER visits in previous 12 months Vitals Screenings to include cognitive, depression, and falls Referrals and appointments  In addition, I have reviewed and discussed with patient certain preventive protocols, quality metrics,  and best practice recommendations. A written personalized care plan for preventive services as well as general preventive health recommendations were provided to patient.     Maryan Puls, LPN   1/61/0960   Nurse Notes: none

## 2022-08-17 ENCOUNTER — Ambulatory Visit: Payer: Medicare Other | Admitting: Adult Health

## 2022-08-24 DIAGNOSIS — G4733 Obstructive sleep apnea (adult) (pediatric): Secondary | ICD-10-CM | POA: Diagnosis not present

## 2022-08-24 DIAGNOSIS — R400 Somnolence: Secondary | ICD-10-CM | POA: Diagnosis not present

## 2022-09-09 ENCOUNTER — Ambulatory Visit (INDEPENDENT_AMBULATORY_CARE_PROVIDER_SITE_OTHER): Payer: Medicare Other

## 2022-09-09 DIAGNOSIS — I639 Cerebral infarction, unspecified: Secondary | ICD-10-CM | POA: Diagnosis not present

## 2022-09-10 ENCOUNTER — Telehealth: Payer: Self-pay | Admitting: Family Medicine

## 2022-09-10 ENCOUNTER — Encounter: Payer: Self-pay | Admitting: Family Medicine

## 2022-09-10 MED ORDER — MUPIROCIN 2 % EX OINT: 1.0000 | TOPICAL_OINTMENT | Freq: Two times a day (BID) | CUTANEOUS | 0 refills | Status: DC

## 2022-09-10 NOTE — Telephone Encounter (Signed)
I sent bactroban ointment to their pharmacy for the wound  Follow up if not improving   If worse over the weekend- get checked out at an urgent care

## 2022-09-10 NOTE — Telephone Encounter (Signed)
PCP sent a mychart message with this info to pt/family

## 2022-09-14 LAB — CUP PACEART REMOTE DEVICE CHECK
Date Time Interrogation Session: 20240526191902
Implantable Pulse Generator Implant Date: 20231116
Pulse Gen Serial Number: 511019978

## 2022-09-20 ENCOUNTER — Other Ambulatory Visit: Payer: Self-pay

## 2022-09-20 ENCOUNTER — Encounter: Payer: Self-pay | Admitting: Neurology

## 2022-09-20 ENCOUNTER — Telehealth: Payer: Self-pay | Admitting: Adult Health

## 2022-09-20 ENCOUNTER — Telehealth: Payer: Self-pay

## 2022-09-20 ENCOUNTER — Emergency Department: Payer: Medicare Other

## 2022-09-20 ENCOUNTER — Emergency Department
Admission: EM | Admit: 2022-09-20 | Discharge: 2022-09-20 | Disposition: A | Discharge status: Home / Self Care | Payer: Medicare Other | Attending: Emergency Medicine | Admitting: Emergency Medicine

## 2022-09-20 DIAGNOSIS — S12691A Other nondisplaced fracture of seventh cervical vertebra, initial encounter for closed fracture: Secondary | ICD-10-CM | POA: Diagnosis not present

## 2022-09-20 DIAGNOSIS — W19XXXA Unspecified fall, initial encounter: Secondary | ICD-10-CM

## 2022-09-20 DIAGNOSIS — R410 Disorientation, unspecified: Secondary | ICD-10-CM | POA: Diagnosis not present

## 2022-09-20 DIAGNOSIS — W01198A Fall on same level from slipping, tripping and stumbling with subsequent striking against other object, initial encounter: Secondary | ICD-10-CM | POA: Diagnosis not present

## 2022-09-20 DIAGNOSIS — M25562 Pain in left knee: Secondary | ICD-10-CM | POA: Diagnosis not present

## 2022-09-20 DIAGNOSIS — S0181XA Laceration without foreign body of other part of head, initial encounter: Secondary | ICD-10-CM | POA: Diagnosis not present

## 2022-09-20 DIAGNOSIS — S0010XA Contusion of unspecified eyelid and periocular area, initial encounter: Secondary | ICD-10-CM

## 2022-09-20 DIAGNOSIS — Z471 Aftercare following joint replacement surgery: Secondary | ICD-10-CM | POA: Diagnosis not present

## 2022-09-20 DIAGNOSIS — S128XXA Fracture of other parts of neck, initial encounter: Secondary | ICD-10-CM

## 2022-09-20 DIAGNOSIS — S060X0A Concussion without loss of consciousness, initial encounter: Secondary | ICD-10-CM | POA: Diagnosis not present

## 2022-09-20 DIAGNOSIS — Z96652 Presence of left artificial knee joint: Secondary | ICD-10-CM | POA: Diagnosis not present

## 2022-09-20 DIAGNOSIS — S199XXA Unspecified injury of neck, initial encounter: Secondary | ICD-10-CM | POA: Diagnosis not present

## 2022-09-20 DIAGNOSIS — S0003XA Contusion of scalp, initial encounter: Secondary | ICD-10-CM | POA: Diagnosis not present

## 2022-09-20 LAB — COMPREHENSIVE METABOLIC PANEL
ALT: 13 U/L (ref 0–44)
AST: 24 U/L (ref 15–41)
Albumin: 3.8 g/dL (ref 3.5–5.0)
Alkaline Phosphatase: 96 U/L (ref 38–126)
Anion gap: 9 (ref 5–15)
BUN: 32 mg/dL — ABNORMAL HIGH (ref 8–23)
CO2: 22 mmol/L (ref 22–32)
Calcium: 9 mg/dL (ref 8.9–10.3)
Chloride: 109 mmol/L (ref 98–111)
Creatinine, Ser: 1.49 mg/dL — ABNORMAL HIGH (ref 0.44–1.00)
GFR, Estimated: 34 mL/min — ABNORMAL LOW (ref >60)
Glucose, Bld: 124 mg/dL — ABNORMAL HIGH (ref 70–99)
Potassium: 4.3 mmol/L (ref 3.5–5.1)
Sodium: 140 mmol/L (ref 135–145)
Total Bilirubin: 0.6 mg/dL (ref 0.3–1.2)
Total Protein: 7.4 g/dL (ref 6.5–8.1)

## 2022-09-20 LAB — CBC
HCT: 32.6 % — ABNORMAL LOW (ref 36.0–46.0)
Hemoglobin: 9.6 g/dL — ABNORMAL LOW (ref 12.0–15.0)
MCH: 29.9 pg (ref 26.0–34.0)
MCHC: 29.4 g/dL — ABNORMAL LOW (ref 30.0–36.0)
MCV: 101.6 fL — ABNORMAL HIGH (ref 80.0–100.0)
Platelets: 189 10*3/uL (ref 150–400)
RBC: 3.21 MIL/uL — ABNORMAL LOW (ref 3.87–5.11)
RDW: 14.3 % (ref 11.5–15.5)
WBC: 10.4 10*3/uL (ref 4.0–10.5)
nRBC: 0 % (ref 0.0–0.2)

## 2022-09-20 LAB — URINALYSIS, W/ REFLEX TO CULTURE (INFECTION SUSPECTED)
Bilirubin Urine: NEGATIVE
Glucose, UA: NEGATIVE mg/dL
Hgb urine dipstick: NEGATIVE
Ketones, ur: NEGATIVE mg/dL
Leukocytes,Ua: NEGATIVE
Nitrite: NEGATIVE
Protein, ur: >=300 mg/dL — AB
Specific Gravity, Urine: 1.023 (ref 1.005–1.030)
pH: 5 (ref 5.0–8.0)

## 2022-09-20 LAB — TROPONIN I (HIGH SENSITIVITY): Troponin I (High Sensitivity): 13 ng/L (ref <18)

## 2022-09-20 MED ORDER — HYDROCODONE-ACETAMINOPHEN 5-325 MG PO TABS: 1.0000 | ORAL_TABLET | ORAL | 0 refills | Status: DC | PRN

## 2022-09-20 NOTE — Telephone Encounter (Signed)
Pt daughter called. Stated she wants to schedule an appointment for pt to see Dr. Lucia Gaskins. Stated she think pt might have a blood cloth, stated pt fell flat on face and broke nose, and has a huge knot on her head. Stated pt is black and blue.I informed pt daughter that she may need to follow-up with Emergency Room or Urgent Care. She is requesting a call back from nurse to discuss

## 2022-09-20 NOTE — ED Provider Notes (Signed)
[facility] Provider Note   Event Date/Time   First MD Initiated Contact with Patient 09/20/22 1754     (approximate) History  Fall  HPI Valerie Ramirez is a 89 y.o. 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  Patient denies any loss of consciousness but does endorse increased confusion and memory issues today.  Patient does note bruising and swelling to the forehead and around eyes.  Patient states that she did have a bloody nose and bleeding from the forehead yesterday that was controlled today.  Patient is taking blood thinning medicines and took her normal dosage today.  Patient only complains of headache ROS: Patient currently denies any vision changes, tinnitus, difficulty speaking, facial droop, sore throat, chest pain, shortness of breath, abdominal pain, nausea/vomiting/diarrhea, dysuria, or weakness/numbness/paresthesias in any extremity   Physical Exam  Triage Vital Signs: ED Triage Vitals  Enc Vitals Group     BP 09/20/22 1508 (!) 147/76     Pulse Rate 09/20/22 1508 93     Resp 09/20/22 1508 20     Temp 09/20/22 1508 98.6 F (37 C)     Temp src --      SpO2 09/20/22 1508 95 %     Weight 09/20/22 1509 170 lb (77.1 kg)     Height 09/20/22 1509 5\' 2"  (1.575 m)     Head Circumference --      Peak Flow --      Pain Score 09/20/22 1508 3     Pain Loc --      Pain Edu? --      Excl. in GC? --    Most recent vital signs: Vitals:   09/20/22 1508 09/20/22 1814  BP: (!) 147/76 (!) 213/71  Pulse: 93 73  Resp: 20 15  Temp: 98.6 F (37 C)   SpO2: 95% 100%   General: Awake, oriented x4. CV:  Good peripheral perfusion.  Resp:  Normal effort.  Abd:  No distention.  Other:  Elderly obese Caucasian female laying in bed in no acute distress.  Bilateral periorbital ecchymosis.  Ecchymosis to the lower forehead with a  superficial 3 cm laceration at the central forehead that is hemostatic ED Results / Procedures / Treatments  Labs (all labs ordered are listed, but only abnormal results are displayed) Labs Reviewed  CBC - Abnormal; Notable for the following components:      Result Value   RBC 3.21 (*)    Hemoglobin 9.6 (*)    HCT 32.6 (*)    MCV 101.6 (*)    MCHC 29.4 (*)    All other components within normal limits  COMPREHENSIVE METABOLIC PANEL - Abnormal; Notable for the following components:   Glucose, Bld 124 (*)    BUN 32 (*)    Creatinine, Ser 1.49 (*)    GFR, Estimated 34 (*)    All other components within normal limits  URINALYSIS, W/ REFLEX TO CULTURE (INFECTION SUSPECTED)  TROPONIN I (HIGH SENSITIVITY)   RADIOLOGY ED MD interpretation: CT of the head without contrast interpreted by me shows no evidence of acute abnormalities including no intracerebral hemorrhage, obvious masses, or significant edema  CT of the maxillofacial structures interpreted independently by me and shows no evidence of acute traumatic abnormalities  CT of the cervical spine interpreted independent by me and significant for a nondisplaced fracture through  the right lamina of C7  X-ray of the left knee interpreted independently by me and shows no evidence of acute abnormalities -Agree with radiology assessment Official radiology report(s): CT HEAD WO CONTRAST ( )  Result Date: 09/20/2022 CLINICAL DATA:  Head trauma after fall yesterday. Bruising and swelling noted to forehead and around eyes. Increased confusion. Positive blood thinners. EXAM: CT HEAD WITHOUT CONTRAST CT MAXILLOFACIAL WITHOUT CONTRAST CT CERVICAL SPINE WITHOUT CONTRAST TECHNIQUE: Multidetector CT imaging of the head, cervical spine, and maxillofacial structures were performed using the standard protocol without intravenous contrast. Multiplanar CT image reconstructions of the cervical spine and maxillofacial structures were also generated. RADIATION  DOSE REDUCTION: This exam was performed according to the departmental dose-optimization program which includes automated exposure control, adjustment of the mA and/or kV according to patient size and/or use of iterative reconstruction technique. COMPARISON:  CT and MRI head 01/09/2022; CT cervical spine 06/24/2021 FINDINGS: CT HEAD FINDINGS Brain: No intracranial hemorrhage, mass effect, or evidence of acute infarct. No hydrocephalus. No extra-axial fluid collection. Generalized cerebral atrophy. Ill-defined hypoattenuation within the cerebral white matter is nonspecific but consistent with chronic small vessel ischemic disease. Chronic right thalamic infarct. Chronic right parieto-occipital infarct. Vascular: No hyperdense vessel. Intracranial arterial calcification. Skull: No fracture.  Midline forehead scalp hematoma. Other: None. CT MAXILLOFACIAL FINDINGS Osseous: No fracture or mandibular dislocation. No destructive process. Orbits: Negative. No traumatic or inflammatory finding. Sinuses: Mild mucosal thickening in the maxillary and sphenoid sinuses. Soft tissues: Midline forehead scalp hematoma. Periarticular swelling/contusion about both orbits. CT CERVICAL SPINE FINDINGS Alignment: No evidence of traumatic malalignment. Unchanged grade 1 anterolisthesis of C7. Skull base and vertebrae: Nondisplaced fracture through the right lamina of C7 (7/43 and 9/61). Otherwise no acute fracture. Anterior fusion C5-C7. Soft tissues and spinal canal: No prevertebral fluid or swelling. No visible canal hematoma. Disc levels: Multilevel spondylosis with bulky bridging anterior osteophytes. Uncovertebral spurring and facet arthropathy. No severe spinal canal narrowing. Severe neural foraminal narrowing on the left at C3-C4, on the right at C5-C6, and bilaterally at C6-C7 Upper chest: No acute abnormality. Other: None. IMPRESSION: 1. Nondisplaced fracture through the right lamina of C7. 2. No acute intracranial abnormality. 3.  Midline forehead scalp hematoma.  No calvarial fracture. 4. Periorbital swelling/contusion.  No acute facial fracture. Electronically Signed   By: Minerva Fester M.D.   On: 09/20/2022 16:16   CT Cervical Spine Wo Contrast  Result Date: 09/20/2022 CLINICAL DATA:  Head trauma after fall yesterday. Bruising and swelling noted to forehead and around eyes. Increased confusion. Positive blood thinners. EXAM: CT HEAD WITHOUT CONTRAST CT MAXILLOFACIAL WITHOUT CONTRAST CT CERVICAL SPINE WITHOUT CONTRAST TECHNIQUE: Multidetector CT imaging of the head, cervical spine, and maxillofacial structures were performed using the standard protocol without intravenous contrast. Multiplanar CT image reconstructions of the cervical spine and maxillofacial structures were also generated. RADIATION DOSE REDUCTION: This exam was performed according to the departmental dose-optimization program which includes automated exposure control, adjustment of the mA and/or kV according to patient size and/or use of iterative reconstruction technique. COMPARISON:  CT and MRI head 01/09/2022; CT cervical spine 06/24/2021 FINDINGS: CT HEAD FINDINGS Brain: No intracranial hemorrhage, mass effect, or evidence of acute infarct. No hydrocephalus. No extra-axial fluid collection. Generalized cerebral atrophy. Ill-defined hypoattenuation within the cerebral white matter is nonspecific but consistent with chronic small vessel ischemic disease. Chronic right thalamic infarct. Chronic right parieto-occipital infarct. Vascular: No hyperdense vessel. Intracranial arterial calcification. Skull: No fracture.  Midline forehead scalp hematoma. Other: None. CT MAXILLOFACIAL FINDINGS  Osseous: No fracture or mandibular dislocation. No destructive process. Orbits: Negative. No traumatic or inflammatory finding. Sinuses: Mild mucosal thickening in the maxillary and sphenoid sinuses. Soft tissues: Midline forehead scalp hematoma. Periarticular swelling/contusion about  both orbits. CT CERVICAL SPINE FINDINGS Alignment: No evidence of traumatic malalignment. Unchanged grade 1 anterolisthesis of C7. Skull base and vertebrae: Nondisplaced fracture through the right lamina of C7 (7/43 and 9/61). Otherwise no acute fracture. Anterior fusion C5-C7. Soft tissues and spinal canal: No prevertebral fluid or swelling. No visible canal hematoma. Disc levels: Multilevel spondylosis with bulky bridging anterior osteophytes. Uncovertebral spurring and facet arthropathy. No severe spinal canal narrowing. Severe neural foraminal narrowing on the left at C3-C4, on the right at C5-C6, and bilaterally at C6-C7 Upper chest: No acute abnormality. Other: None. IMPRESSION: 1. Nondisplaced fracture through the right lamina of C7. 2. No acute intracranial abnormality. 3. Midline forehead scalp hematoma.  No calvarial fracture. 4. Periorbital swelling/contusion.  No acute facial fracture. Electronically Signed   By: Minerva Fester M.D.   On: 09/20/2022 16:16   CT MAXILLOFACIAL WO CONTRAST  Result Date: 09/20/2022 CLINICAL DATA:  Head trauma after fall yesterday. Bruising and swelling noted to forehead and around eyes. Increased confusion. Positive blood thinners. EXAM: CT HEAD WITHOUT CONTRAST CT MAXILLOFACIAL WITHOUT CONTRAST CT CERVICAL SPINE WITHOUT CONTRAST TECHNIQUE: Multidetector CT imaging of the head, cervical spine, and maxillofacial structures were performed using the standard protocol without intravenous contrast. Multiplanar CT image reconstructions of the cervical spine and maxillofacial structures were also generated. RADIATION DOSE REDUCTION: This exam was performed according to the departmental dose-optimization program which includes automated exposure control, adjustment of the mA and/or kV according to patient size and/or use of iterative reconstruction technique. COMPARISON:  CT and MRI head 01/09/2022; CT cervical spine 06/24/2021 FINDINGS: CT HEAD FINDINGS Brain: No intracranial  hemorrhage, mass effect, or evidence of acute infarct. No hydrocephalus. No extra-axial fluid collection. Generalized cerebral atrophy. Ill-defined hypoattenuation within the cerebral white matter is nonspecific but consistent with chronic small vessel ischemic disease. Chronic right thalamic infarct. Chronic right parieto-occipital infarct. Vascular: No hyperdense vessel. Intracranial arterial calcification. Skull: No fracture.  Midline forehead scalp hematoma. Other: None. CT MAXILLOFACIAL FINDINGS Osseous: No fracture or mandibular dislocation. No destructive process. Orbits: Negative. No traumatic or inflammatory finding. Sinuses: Mild mucosal thickening in the maxillary and sphenoid sinuses. Soft tissues: Midline forehead scalp hematoma. Periarticular swelling/contusion about both orbits. CT CERVICAL SPINE FINDINGS Alignment: No evidence of traumatic malalignment. Unchanged grade 1 anterolisthesis of C7. Skull base and vertebrae: Nondisplaced fracture through the right lamina of C7 (7/43 and 9/61). Otherwise no acute fracture. Anterior fusion C5-C7. Soft tissues and spinal canal: No prevertebral fluid or swelling. No visible canal hematoma. Disc levels: Multilevel spondylosis with bulky bridging anterior osteophytes. Uncovertebral spurring and facet arthropathy. No severe spinal canal narrowing. Severe neural foraminal narrowing on the left at C3-C4, on the right at C5-C6, and bilaterally at C6-C7 Upper chest: No acute abnormality. Other: None. IMPRESSION: 1. Nondisplaced fracture through the right lamina of C7. 2. No acute intracranial abnormality. 3. Midline forehead scalp hematoma.  No calvarial fracture. 4. Periorbital swelling/contusion.  No acute facial fracture. Electronically Signed   By: Minerva Fester M.D.   On: 09/20/2022 16:16   DG Knee Complete 4 Views Left  Result Date: 09/20/2022 CLINICAL DATA:  Left knee pain after fall yesterday. EXAM: LEFT KNEE - COMPLETE 4+ VIEW COMPARISON:  June 24, 2021. FINDINGS: Status post left total knee arthroplasty. No definite fracture or  dislocation is noted. IMPRESSION: No acute abnormality seen. Electronically Signed   By: Lupita Raider M.D.   On: 09/20/2022 15:45   PROCEDURES: Critical Care performed: No Procedures MEDICATIONS ORDERED IN ED: Medications - No data to display IMPRESSION / MDM / ASSESSMENT AND PLAN / ED COURSE  I reviewed the triage vital signs and the nursing notes.                             The patient is on the cardiac monitor to evaluate for evidence of arrhythmia and/or significant heart rate changes. Patient's presentation is most consistent with acute presentation with potential threat to life or bodily function. Presenting after a fall that occurred just prior to arrival, resulting in injury to the forehead and neck. The mechanism of injury was a mechanical ground level fall without syncope or near-syncope. The current level of pain is moderate. There was no loss of consciousness, confusion, seizure, or memory impairment. There is a laceration associated with the injury. Denies neck pain. The patient does take blood thinner medications. Denies vomiting, numbness/weakness, fever CT cervical spine concerning for fracture at the C7 lamina on the right I spoke to on-call neurosurgeon, Dr. Katrinka Blazing who recommended cervical collar and follow-up in 2 weeks in neurosurgical clinic Dispo: Discharge with PCP follow-up       FINAL CLINICAL IMPRESSION(S) / ED DIAGNOSES   Final diagnoses:  Other closed nondisplaced fracture of seventh cervical vertebra, initial encounter (HCC)  Fall, initial encounter  Periorbital ecchymosis, unspecified laterality, initial encounter  Laceration of forehead, initial encounter  Concussion without loss of consciousness, initial encounter   Rx / DC Orders   ED Discharge Orders     None      Note:  This document was prepared using Dragon voice recognition software and may include  unintentional dictation errors.   Merwyn Katos, MD 09/20/22 [phone]

## 2022-09-20 NOTE — Telephone Encounter (Signed)
She absolutely has to go to the emergency room, I will not see her until she goes to the [facility] room she needs to as you very well said

## 2022-09-20 NOTE — Progress Notes (Signed)
Reviewed the images from Valerie Ramirez's chart.  History of a C5-C7 ACDF who suffered a fall yesterday resulting in a right frontal scalp contusion.  She is on blood thinners but has no evidence of intracranial hemorrhage.  Film review was performed for her cervical laminar fracture.  This is at a adjacent level so we will have her wear a collar and have her come to clinic for clearance with flexion-extension films.  This point we do not indication for surgical intervention, she does not need to hold her blood thinners at this point for the cervical fracture.

## 2022-09-20 NOTE — ED Provider Triage Note (Signed)
Emergency Medicine Provider Triage Evaluation Note  Valerie Ramirez , a 89 y.o. female  was evaluated in triage.  Pt complains of injuries from fall that happened yesterday. Family thinks that she tripped over a box of books near the door.  Family states that she is at normal baseline  Review of Systems  Positive: + bruises Negative: No N/V/D.  No vision changes acutely.  Physical Exam  BP (!) 147/76   Pulse 93   Temp 98.6 F (37 C)   Resp 20   Ht 5\' 2"  (1.575 m)   Wt 77.1 kg   SpO2 95%   BMI 31.09 kg/m  Gen:   Awake, no distress  alert and answers questions appropriately  Resp:  Normal effort  MSK:   Moves extremities without difficulty  Other:  Moderate edema and ecchymosis periorbital areas bilaterally.  No deformity noted.    Medical Decision Making  Medically screening exam initiated at 3:10 PM.  Appropriate orders placed.  Valerie Ramirez was informed that the remainder of the evaluation will be completed by another provider, this initial triage assessment does not replace that evaluation, and the importance of remaining in the ED until their evaluation is complete.     Tommi Rumps, PA-C 09/20/22 1526

## 2022-09-20 NOTE — Telephone Encounter (Signed)
Loreen Freud, MD  Valerie Gowda, RN [facility],  She should be seen in clinic in a week or 2 just for surgical clearance.  I do not think this is unstable but I did not think they be able to get good films in the OR to clear her.  Thanks, Apolinar Junes

## 2022-09-20 NOTE — Telephone Encounter (Signed)
Daughter also called. See phone note for documentation.

## 2022-09-20 NOTE — Telephone Encounter (Signed)
202 pm I called the patient's daughter back after viewing her mychart message she sent to [place] with a picture of the patient. She also said the patient wouldn't go to ER but would come and see Dr Lucia Gaskins. Patient has severe bilateral bruising under her eyes and her forehead above the nose. The patient is on Eliquis. I called the patient's daughter. The patient could not tell the daughter how she fell. The patient fell again today with the daughter and she had to call the brother to help pickup the patient. She said the patient is not completely herself but she did not elaborate on any symptoms. I did kindly explain to the daughter that I was so sorry she fell and given the pictures of her injuries and the fact that she's on a blood thinner we highly recommend she go to the ER asap. I told her with the bleeding under her eyes there is a possibility there could be a skull fracture and also possible bleeding in the brain which is serious and bleeding could be fatal. The daughter stated she and her brother knew and she would talk to him about what they would do. I told her I understood she was in a predicament with the patient saying she would not go but I advised she strongly consider calling 911 for the patient anyway for her to be evaluated. She was appreciative for my call and said she would send a mychart message with an update. Will post picture of patient in this thread.

## 2022-09-20 NOTE — ED Notes (Signed)
Soft style c-collar applied and well fit. No complaints.

## 2022-09-20 NOTE — ED Triage Notes (Signed)
Pt to ED for fall yesterday. Tripped over books. Hit head, denies LOC. Bruising and swelling noted to fore head and around eyes. Family reports some increased confusion. +blood thinners

## 2022-09-20 NOTE — Telephone Encounter (Signed)
Per Dr Katrinka Blazing, needs an appt in 1-2 weeks cor cervical fracture. She will need flexion extension xrays 1 hour prior to her appointment to clear her of the collar. I have ordered the xray.  Appt made with Stacy on 10/07/22 (I scheduled on a day that Dr Myer Haff is in clinic so she can review the xray with him to verify that it is OK to clear her of the collar). Please notify the patient of the appointment and the need to obtain xrays 1 hour prior. Thanks

## 2022-09-21 NOTE — Telephone Encounter (Signed)
Update: patient went to ER and had CT head, c-spine, and maxillofacial completed. Results below:   Patient was d/c from ER and will f/u with neurosurgery in 2 weeks for cervical fracture

## 2022-09-24 ENCOUNTER — Telehealth: Payer: Self-pay

## 2022-09-24 NOTE — Telephone Encounter (Signed)
Transition Care Management Unsuccessful Follow-up Telephone Call  Date of discharge and from where:  [facility] 6/3  Attempts:  1st Attempt  Reason for unsuccessful TCM follow-up call:  Left voice message   Lenard Forth [facility] Guide, [facility] [phone] [address] [place], [place], [place] [address] Phone: [phone] Email: [email or web address]

## 2022-09-24 NOTE — Telephone Encounter (Signed)
Transition Care Management Unsuccessful Follow-up Telephone Call  Date of discharge and from where:  [place]  6/3  Attempts:  2nd Attempt  Reason for unsuccessful TCM follow-up call:  Unable to leave message   Valerie Ramirez [facility] Guide, [facility] [phone] [address] [place], [place], [place] [address] Phone: [phone] Email: [email or web address]

## 2022-09-29 NOTE — Progress Notes (Signed)
Merlin Loop Recorder 

## 2022-10-04 NOTE — Progress Notes (Addendum)
Referring Physician:  Tower, Audrie Gallus, MD [address] [place],  [place] [address]  Primary Physician:  Tower, Audrie Gallus, MD  History of Present Illness: 10/07/2022 [name] has a history of HTN, CAD, hypothyroidism, vascular dementia, CKD stage 3b, CVA, hypercholesterolemia, cardiomyopathy, endometrial CA, gout, afib.   History of ACDF C5-C7 who had a fall on 09/19/22. Found to have cervical laminar fracture at C7. ED spoke with Dr. Katrinka Blazing and he recommended she wear collar and be seen as outpatient for clearance flexion/extension xrays.   She is here for follow up.   Overall she feels good, but her daughter states she was having some intermittent neck pain for about a week after the fall. No current neck or arm pain. No numbness, tingling, or weakness. Per daughter, she has not been wearing collar from ED as it makes her pain worse. She has a soft collar that she wears prn.   She is taking prn OTC tylenol.   She is on ELIQUIS.   Bowel/Bladder Dysfunction: none  Past Surgery: ACDF in 2017 by Dr Ophelia Charter  Review of Systems:  A 10 point review of systems is negative, except for the pertinent positives and negatives detailed in the HPI.  Past Medical History: Past Medical History:  Diagnosis Date   Arthritis    OA   CAD (coronary artery disease)    stent December 2018   Cancer [facility])    endometrial   GERD (gastroesophageal reflux disease)    Hearing loss    History of kidney stones    Hyperlipidemia    Hypertension    Hypothyroid    "not in years"   Shortness of breath dyspnea    04/25/17- not anymore    Past Surgical History: Past Surgical History:  Procedure Laterality Date   ABDOMINAL HYSTERECTOMY     ANTERIOR CERVICAL DECOMP/DISCECTOMY FUSION [name] 08/18/2015   Procedure: C5-6, C6-7 Anterior Cervical Discectomy and Fusion, Allograft, Plate;  Surgeon: Eldred Manges, MD;  Location: [facility] OR;  Service: Orthopedics;  Laterality: [name];   bone spur removed Right     shoulder   BOWEL RESECTION  07/04/2012   Procedure: SMALL BOWEL RESECTION;  Surgeon: Jeannette Corpus, MD;  Location: [facility] ORS;  Service: Gynecology;;   BREAST BIOPSY     CHOLECYSTECTOMY     CORONARY STENT INTERVENTION [name] 04/06/2017   Procedure: CORONARY STENT INTERVENTION;  Surgeon: Iran Ouch, MD;  Location: [facility] INVASIVE CV LAB;  Service: Cardiovascular;  Laterality: [name];   DILATION AND CURETTAGE OF UTERUS  1999   EYE SURGERY Bilateral    cataracts   FALSE ANEURYSM REPAIR Right 04/26/2017   Procedure: 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;  Surgeon: Fransisco Hertz, MD;  Location: [facility] OR;  Service: Vascular;  Laterality: Right;   HAND SURGERY  12/2008   after hand fx/due to fall   JOINT REPLACEMENT Bilateral 07/1998   knees   KNEE ARTHROPLASTY  05/23/1998   total right   LAPAROTOMY Bilateral 07/04/2012   Procedure: EXPLORATORY LAPAROTOMY TOTAL ABDOMINAL HYSTERECTOMY BILATERAL SALPINGO-OOPHORECTOMY with small bowel resection ;  Surgeon: Jeannette Corpus, MD;  Location: [facility] ORS;  Service: Gynecology;  Laterality: Bilateral;   LEFT HEART CATH AND CORONARY ANGIOGRAPHY [name] 04/06/2017   Procedure: LEFT HEART CATH AND CORONARY ANGIOGRAPHY;  Surgeon: Dolores Patty, MD;  Location: [facility] INVASIVE CV LAB;  Service: Cardiovascular;  Laterality: [name];   polyp removal  1999   TEE WITHOUT CARDIOVERSION [name] 01/12/2022  Procedure: TRANSESOPHAGEAL ECHOCARDIOGRAM (TEE);  Surgeon: Antonieta Iba, MD;  Location: [facility] ORS;  Service: Cardiovascular;  Laterality: [name];   TUBAL LIGATION      Allergies: Allergies as of 10/07/2022 - Review Complete 10/07/2022  Allergen Reaction Noted   Allopurinol Nausea Only 08/10/2013   Lipitor [atorvastatin calcium] Itching and Other (See Comments) 03/31/2011   Morphine and codeine Other (See Comments) 10/15/2017   Simvastatin Other (See Comments) 07/29/2008   Tape Itching, Rash, and Other (See Comments) 06/30/2012    Medications: Outpatient  Encounter Medications as of 10/07/2022  Medication Sig   acetaminophen (TYLENOL) 500 MG tablet Take 500 mg by mouth every 8 (eight) hours as needed.   apixaban (ELIQUIS) 2.5 MG TABS tablet Take 1 tablet (2.5 mg total) by mouth 2 (two) times daily.   Cholecalciferol (VITAMIN D-3) 25 MCG (1000 UT) CAPS Take 1,000 Units by mouth daily.   colchicine 0.6 MG tablet Take 1 tablet (0.6 mg total) by mouth daily.   Cyanocobalamin (VITAMIN B-12 PO) Take 1 tablet by mouth daily.   diclofenac Sodium (VOLTAREN ARTHRITIS PAIN) 1 % GEL Apply 4 g topically 4 (four) times daily.   donepezil (ARICEPT) 5 MG tablet TAKE 1 TABLET BY MOUTH AT BEDTIME   DULoxetine (CYMBALTA) 30 MG capsule TAKE ONE CAPSULE BY MOUTH DAILY   ferrous sulfate (FEROSUL) 325 (65 FE) MG tablet Take 1 tablet (325 mg total) by mouth daily.   lidocaine (LIDODERM) 5 % Place 1 patch onto the skin daily. Remove & Discard patch within 12 hours or as directed by MD   losartan (COZAAR) 25 MG tablet Take 1 tablet (25 mg total) by mouth daily.   Magnesium Chloride (MAG-SR PLUS CALCIUM PO) Take by mouth. Taking 1 tab once for dizziness   multivitamin-iron-minerals-folic acid (CENTRUM) chewable tablet Chew 1 tablet by mouth daily.   mupirocin ointment (BACTROBAN) 2 % Apply 1 Application topically 2 (two) times daily. To affected area   nitroGLYCERIN (NITROSTAT) 0.4 MG SL tablet Place 1 tablet (0.4 mg total) under the tongue every 5 (five) minutes x 3 doses as needed for chest pain.   rosuvastatin (CRESTOR) 20 MG tablet Take 20 mg by mouth at bedtime.   [DISCONTINUED] amoxicillin-clavulanate (AUGMENTIN) 875-125 MG tablet Take 1 tablet by mouth 2 (two) times daily. (Patient not taking: Reported on 10/05/2022)   [DISCONTINUED] HYDROcodone-acetaminophen (NORCO) 5-325 MG tablet Take 1 tablet by mouth every 4 (four) hours as needed for moderate pain. (Patient not taking: Reported on 10/05/2022)   No facility-administered encounter medications on file as of  10/07/2022.    Social History: Social History   Tobacco Use   Smoking status: Never   Smokeless tobacco: Never  Vaping Use   Vaping Use: Never used  Substance Use Topics   Alcohol use: Never    Alcohol/week: 0.0 standard drinks of alcohol   Drug use: Never    Family Medical History: Family History  Problem Relation Age of Onset   Heart disease Father    Heart attack Father    Stroke Father    Heart disease Sister        CABG   Stroke Sister    Cancer Sister    Bladder Cancer Sister    Stroke Sister        "massive brain bleed"   Leukemia Brother    Lung cancer Brother    Leukemia Brother    Stomach cancer Brother    Heart disease Brother  CABG   Dementia Neg Hx     Physical Examination: Vitals:   10/07/22 1002  BP: (!) 150/70    General: Patient is well developed, well nourished, calm, collected, and in no apparent distress. Attention to examination is appropriate.  Respiratory: Patient is breathing without any difficulty.   NEUROLOGICAL:     Awake, alert, oriented to person, place, and time.  Speech is clear and fluent. Fund of knowledge is appropriate.   Cranial Nerves: Pupils equal round and reactive to light.  Facial tone is symmetric.    ROM of cervical spine not tested.  No posterior cervical tenderness.   No abnormal lesions on exposed skin.   Strength: Side Biceps Triceps Deltoid Interossei Grip Wrist Ext. Wrist Flex.  R 5 5 5 5 5 5 5   L 5 5 5 5 5 5 5    Side Iliopsoas Quads Hamstring PF DF EHL  R 5 5 5 5 5 5   L 5 5 5 5 5 5    Reflexes are 1+ and symmetric at the biceps, triceps, brachioradialis, patella and achilles.   Hoffman's is absent.  Clonus is not present.   Bilateral upper and lower extremity sensation is intact to light touch.     Gait is slow.    Medical Decision Making  Imaging: CT of cervical spine dated 09/20/22:  CT CERVICAL SPINE FINDINGS   Alignment: No evidence of traumatic malalignment. Unchanged grade  1 anterolisthesis of C7.   Skull base and vertebrae: Nondisplaced fracture through the right lamina of C7 (7/43 and 9/61). Otherwise no acute fracture. Anterior fusion C5-C7.   Soft tissues and spinal canal: No prevertebral fluid or swelling. No visible canal hematoma.   Disc levels: Multilevel spondylosis with bulky bridging anterior osteophytes. Uncovertebral spurring and facet arthropathy. No severe spinal canal narrowing. Severe neural foraminal narrowing on the left at C3-C4, on the right at C5-C6, and bilaterally at C6-C7   Upper chest: No acute abnormality.   Other: None.   IMPRESSION: Nondisplaced fracture through the right lamina of C7.  Electronically Signed   By: Minerva Fester M.D.   On: 09/20/2022 16:16    I have personally reviewed the images and agree with the above interpretation.  Assessment and Plan: Ms. Fiest is a pleasant 89 y.o. female has history of ACDF C5-C7 who had a fall on 09/19/22. Found to have cervical laminar fracture at C7. She was placed in collar that she appears to be wearing intermittently.   Her daughter states she was having some intermittent neck pain for about a week after the fall. No current neck or arm pain. No numbness, tingling, or weakness.   She did not get cervical xrays done prior to her visit.   Treatment options discussed with patient and following plan made:   - She will get cervical xrays done today.  - She is to stay in cervical collar. Can remove to sleep.  - No lifting over 5 pounds.  - Will message her on MyChart with xray results (daughter [name] does her MyChart). If everything looks okay, she can d/c collar.   I spent a total of 20 minutes in face-to-face and non-face-to-face activities related to this patient's care today including review of outside records, review of imaging, review of symptoms, physical exam, discussion of differential diagnosis, discussion of treatment options, and documentation.   Thank you  for involving me in the care of this patient.   ADDENDUM 10/13/22:  Cervical xrays dated 10/07/22:  FINDINGS: The  nondisplaced right C7 lamina fracture is again visualized. No interval displacement evidence of healing. Stable interbody and anterior screw and plate fusion at the C5 through C7 levels with solid bone fusion. Stable grade 1 anterolisthesis at the C7-T1 level. This does not change with flexion and extension. Stable large anterior osteophytes at the C2-3 through C4-5 levels.   IMPRESSION: 1. Stable nondisplaced right C7 lamina fracture. 2. Stable postoperative changes and degenerative changes. 3. No instability with flexion and extension.     Electronically Signed   By: Beckie Salts M.D.   On: 10/12/2022 16:30  I have personally reviewed the images and agree with the above interpretation.  Xrays reviewed with Dr. Myer Haff. Okay to d/c cervical collar and follow up prn. Message sent to her.   Drake Leach PA-C Dept. of Neurosurgery

## 2022-10-05 ENCOUNTER — Ambulatory Visit: Payer: Medicare Other | Attending: Cardiovascular Disease | Admitting: Cardiovascular Disease

## 2022-10-05 ENCOUNTER — Encounter: Payer: Self-pay | Admitting: Cardiovascular Disease

## 2022-10-05 VITALS — BP 140/86 | HR 86 | Ht 62.0 in | Wt 180.0 lb

## 2022-10-05 DIAGNOSIS — I480 Paroxysmal atrial fibrillation: Secondary | ICD-10-CM

## 2022-10-05 NOTE — Progress Notes (Signed)
  Cardiology Office Note:    Date:  10/05/2022   ID:  Valerie Ramirez, DOB 1933-02-23, MRN [id number]  PCP:  Valerie Pimple, MD   [facility] Providers Cardiologist:  Valerie Swaziland, MD Electrophysiologist:  Valerie Small, MD     Referring MD: Valerie Pimple, MD   Chief complaint: many strokes  History of Present Illness:    Valerie Ramirez is a 89 y.o. female with a hx of recurrent strokes referred for implantable loop recorder placement; she presents today for routine follow-up.[name]  She has an Abbott loop recorder placed for A-fib screening after stroke.  AF was detected on the loop monitor.  She has been asymptomatic.  AF rates have been controlled.  She is on Eliquis.  She denies any symptoms of AF -- no palpitations, paroxysms of fatigue.  Daughter reports that she had a spell of confusion and just "being out of it" 2-3 days ago. We interrogated the loop recorder today. There were no events to explain these symptoms.   Past Medical History:  Diagnosis Date   Arthritis    OA   CAD (coronary artery disease)    stent December 2018   Cancer [facility])    endometrial   GERD (gastroesophageal reflux disease)    Hearing loss    History of kidney stones    Hyperlipidemia    Hypertension    Hypothyroid    "not in years"   Shortness of breath dyspnea    04/25/17- not anymore      EKGs/Labs/Other Studies Reviewed:      EKG:  Last EKG results:    Recent Labs: 11/14/2021: Magnesium 2.4 03/02/2022: TSH 1.690 09/20/2022: ALT 13; BUN 32; Creatinine, Ser 1.49; Hemoglobin 9.6; Platelets 189; Potassium 4.3; Sodium 140      Physical Exam:    VS:  BP (!) 140/86   Pulse 86   Ht 5\' 2"  (1.575 m)   Wt 180 lb (81.6 kg)   SpO2 99%   BMI 32.92 kg/m     Wt Readings from Last 3 Encounters:  10/05/22 180 lb (81.6 kg)  09/20/22 170 lb (77.1 kg)  08/11/22 180 lb (81.6 kg)     GEN:  Well nourished, well developed in no acute distress CARDIAC: RRR, no murmurs, rubs,  gallops RESPIRATORY:  Normal work of breathing MUSCULOSKELETAL: no edema    ASSESSMENT & PLAN:    Recurrent strokes, likely embolic: ILR monitor was placed after 30 day monitor did not show any AF. AF was detected on ILR shortly thereafter. She is tolerating eliquis well for prevention of stroke. Atrial fibrillation: asymptomatic, rate controlled. Continue to monitor with loop recorder Secondary hypercoagulable state. Labs reviewed. Age is > 80 but Cr < 1.49; will need to reduce dose if creatinine declines any further.           Medication Adjustments/Labs and Tests Ordered: Current medicines are reviewed at length with the patient today.  Concerns regarding medicines are outlined above.  Orders Placed This Encounter  Procedures   EKG 12-Lead   No orders of the defined types were placed in this encounter.    Signed, Valerie Small, MD  10/05/2022 10:12 AM     [facility]

## 2022-10-05 NOTE — Patient Instructions (Signed)
Medication Instructions:  Your physician recommends that you continue on your current medications as directed. Please refer to the Current Medication list given to you today. *If you need a refill on your cardiac medications before your next appointment, please call your pharmacy*   Follow-Up: At [facility], you and your health needs are our priority.  As part of our continuing mission to provide you with exceptional heart care, we have created designated Provider Care Teams.  These Care Teams include your primary Cardiologist (physician) and Advanced Practice Providers (APPs -  Physician Assistants and Nurse Practitioners) who all work together to provide you with the care you need, when you need it.  We recommend signing up for the patient portal called "MyChart".  Sign up information is provided on this After Visit Summary.  MyChart is used to connect with patients for Virtual Visits (Telemedicine).  Patients are able to view lab/test results, encounter notes, upcoming appointments, etc.  Non-urgent messages can be sent to your provider as well.   To learn more about what you can do with MyChart, go to [email or web address].    Your next appointment:   1 year(s)  Provider:   Augustus Mealor, MD  

## 2022-10-06 ENCOUNTER — Other Ambulatory Visit: Payer: Self-pay | Admitting: Family Medicine

## 2022-10-07 ENCOUNTER — Encounter: Payer: Self-pay | Admitting: Orthopedic Surgery

## 2022-10-07 ENCOUNTER — Ambulatory Visit
Admission: RE | Admit: 2022-10-07 | Discharge: 2022-10-07 | Disposition: A | Discharge status: Home / Self Care | Payer: Medicare Other | Attending: Orthopedic Surgery | Admitting: Orthopedic Surgery

## 2022-10-07 ENCOUNTER — Ambulatory Visit
Admission: RE | Admit: 2022-10-07 | Discharge: 2022-10-07 | Disposition: A | Discharge status: Home / Self Care | Payer: Medicare Other | Source: Ambulatory Visit | Attending: Orthopedic Surgery | Admitting: Orthopedic Surgery

## 2022-10-07 ENCOUNTER — Ambulatory Visit: Payer: Medicare Other | Admitting: Orthopedic Surgery

## 2022-10-07 VITALS — BP 130/72 | Ht 62.0 in | Wt 182.2 lb

## 2022-10-07 DIAGNOSIS — S128XXA Fracture of other parts of neck, initial encounter: Secondary | ICD-10-CM

## 2022-10-07 DIAGNOSIS — W19XXXA Unspecified fall, initial encounter: Secondary | ICD-10-CM

## 2022-10-07 DIAGNOSIS — S12691A Other nondisplaced fracture of seventh cervical vertebra, initial encounter for closed fracture: Secondary | ICD-10-CM | POA: Diagnosis not present

## 2022-10-07 DIAGNOSIS — S12601A Unspecified nondisplaced fracture of seventh cervical vertebra, initial encounter for closed fracture: Secondary | ICD-10-CM | POA: Diagnosis not present

## 2022-10-07 NOTE — Telephone Encounter (Signed)
Colchicine last filled on 04/01/22 #90 tabs/ 1 refill,  Bactroban last filled on 09/10/22 #22 g/ 0 refills,  Last OV was on 03/17/22, no future appts.

## 2022-10-07 NOTE — Patient Instructions (Signed)
It was so nice to see you today. Thank you so much for coming in.    You broke a bone in your neck at C7.   I ordered xrays of your neck. You can get these at [facility] (building with the white pillars) off of [address]. The address is [address] [place], [place] [address]. You do not need any appointment. I will message you with results.   It can take 5-7 days for me to get the results.   You need to stay in your cervical collar until I tell you to come out of it. You can remove it to sleep.   No lifting over 5 pounds with your arms.   If xrays look okay, you will follow up with me as needed.   Please do not hesitate to call if you have any questions or concerns. You can also message me in MyChart.   Drake Leach PA-C [phone]

## 2022-10-11 ENCOUNTER — Ambulatory Visit (INDEPENDENT_AMBULATORY_CARE_PROVIDER_SITE_OTHER): Payer: Medicare Other

## 2022-10-11 DIAGNOSIS — I639 Cerebral infarction, unspecified: Secondary | ICD-10-CM | POA: Diagnosis not present

## 2022-10-12 LAB — CUP PACEART REMOTE DEVICE CHECK
Date Time Interrogation Session: 20240621221519
Date Time Interrogation Session: 20240622073019
Implantable Pulse Generator Implant Date: 20231116
Implantable Pulse Generator Implant Date: 20231116
Pulse Gen Serial Number: 511019978
Pulse Gen Serial Number: 511019978

## 2022-10-29 NOTE — Progress Notes (Signed)
Merlin Loop Recorder 

## 2022-11-01 DIAGNOSIS — I10 Essential (primary) hypertension: Secondary | ICD-10-CM | POA: Diagnosis not present

## 2022-11-01 DIAGNOSIS — M545 Low back pain, unspecified: Secondary | ICD-10-CM | POA: Diagnosis not present

## 2022-11-01 DIAGNOSIS — Z7901 Long term (current) use of anticoagulants: Secondary | ICD-10-CM | POA: Diagnosis not present

## 2022-11-01 DIAGNOSIS — Z6831 Body mass index (BMI) 31.0-31.9, adult: Secondary | ICD-10-CM | POA: Diagnosis not present

## 2022-11-01 DIAGNOSIS — E785 Hyperlipidemia, unspecified: Secondary | ICD-10-CM | POA: Diagnosis not present

## 2022-11-01 DIAGNOSIS — H9192 Unspecified hearing loss, left ear: Secondary | ICD-10-CM | POA: Diagnosis not present

## 2022-11-01 DIAGNOSIS — E669 Obesity, unspecified: Secondary | ICD-10-CM | POA: Diagnosis not present

## 2022-11-01 DIAGNOSIS — R32 Unspecified urinary incontinence: Secondary | ICD-10-CM | POA: Diagnosis not present

## 2022-11-11 ENCOUNTER — Ambulatory Visit (INDEPENDENT_AMBULATORY_CARE_PROVIDER_SITE_OTHER): Payer: Medicare Other

## 2022-11-11 DIAGNOSIS — I639 Cerebral infarction, unspecified: Secondary | ICD-10-CM

## 2022-11-12 LAB — CUP PACEART REMOTE DEVICE CHECK: Date Time Interrogation Session: 20240725064855

## 2022-11-15 ENCOUNTER — Telehealth: Payer: Self-pay

## 2022-11-15 NOTE — Telephone Encounter (Signed)
Receiving frequent alerts for false AF. Could we reprogram AF sensitivity from "balanced" to "least" to capture more accurate AF burden?

## 2022-11-23 NOTE — Progress Notes (Signed)
Carelink Summary Report / Loop Recorder 

## 2022-12-04 ENCOUNTER — Other Ambulatory Visit: Payer: Self-pay | Admitting: Family Medicine

## 2022-12-13 ENCOUNTER — Ambulatory Visit: Payer: Medicare Other

## 2022-12-16 ENCOUNTER — Telehealth: Payer: Self-pay | Admitting: Neurology

## 2022-12-16 NOTE — Telephone Encounter (Signed)
Valerie Ramirez I scheduled her with you for 9/3 at 2:00.

## 2022-12-16 NOTE — Telephone Encounter (Signed)
Patient fell and is having headaches. CT head negative. Daughter is asking if we have a sooner appt for her. Do any NPs have anything in the next week?

## 2022-12-17 ENCOUNTER — Ambulatory Visit (INDEPENDENT_AMBULATORY_CARE_PROVIDER_SITE_OTHER)
Admission: RE | Admit: 2022-12-17 | Discharge: 2022-12-17 | Disposition: A | Discharge status: Home / Self Care | Payer: Medicare Other | Source: Ambulatory Visit | Attending: Family Medicine | Admitting: Family Medicine

## 2022-12-17 ENCOUNTER — Ambulatory Visit: Payer: Medicare Other | Admitting: Family Medicine

## 2022-12-17 ENCOUNTER — Encounter: Payer: Self-pay | Admitting: Family Medicine

## 2022-12-17 VITALS — BP 140/82 | HR 80 | Temp 97.7°F | Ht 62.0 in | Wt 187.0 lb

## 2022-12-17 DIAGNOSIS — G3184 Mild cognitive impairment, so stated: Secondary | ICD-10-CM

## 2022-12-17 DIAGNOSIS — R053 Chronic cough: Secondary | ICD-10-CM | POA: Diagnosis not present

## 2022-12-17 DIAGNOSIS — R1314 Dysphagia, pharyngoesophageal phase: Secondary | ICD-10-CM

## 2022-12-17 DIAGNOSIS — R42 Dizziness and giddiness: Secondary | ICD-10-CM

## 2022-12-17 DIAGNOSIS — R519 Headache, unspecified: Secondary | ICD-10-CM

## 2022-12-17 DIAGNOSIS — I10 Essential (primary) hypertension: Secondary | ICD-10-CM

## 2022-12-17 DIAGNOSIS — R1310 Dysphagia, unspecified: Secondary | ICD-10-CM | POA: Insufficient documentation

## 2022-12-17 DIAGNOSIS — I700 Atherosclerosis of aorta: Secondary | ICD-10-CM | POA: Diagnosis not present

## 2022-12-17 MED ORDER — FAMOTIDINE 20 MG PO TABS: 20.0000 mg | ORAL_TABLET | Freq: Two times a day (BID) | ORAL | 3 refills | Status: DC

## 2022-12-17 NOTE — Progress Notes (Unsigned)
Subjective:    Patient ID: Valerie Ramirez, female    DOB: October 30, 1932, 89 y.o.   MRN: [id number]  HPI  Wt Readings from Last 3 Encounters:  12/17/22 187 lb (84.8 kg)  10/07/22 182 lb 3.2 oz (82.6 kg)  10/05/22 180 lb (81.6 kg)   34.20 kg/m  Vitals:   12/17/22 0922 12/17/22 0947  BP: (!) 160/94 (!) 140/82  Pulse: 80   Temp: 97.7 F (36.5 C)   SpO2: 95%    Pt presents for headache since a fall -seeing neurology Swallowing issued with chronic cough  Needs handicapped parking - for dizziness and fatigue s/p stroke   Peristant cough-several years  Occational trouble swallowing (food seems to stick part way down) Does not recognize heartburn     Larey Seat in June Closed nondisplaced fracture of 7th cervical vert- saw neuro surg  Wore a neck collar for a shile  Concussion    Seeing Dr Lucia Gaskins for neuro Had a CT of head   Still really struggling with headache since the fall from concussion  Has improved but not gone  Has appt with neuro on 9/3 - soon   No more strokes since last November     HTN bp is stable today  No cp or palpitations or headaches or edema  No side effects to medicines  BP Readings from Last 3 Encounters:  12/17/22 (!) 140/82  10/07/22 130/72  10/05/22 (!) 140/86      Lab Results  Component Value Date   NA 140 09/20/2022   K 4.3 09/20/2022   CO2 22 09/20/2022   GLUCOSE 124 (H) 09/20/2022   BUN 32 (H) 09/20/2022   CREATININE 1.49 (H) 09/20/2022   CALCIUM 9.0 09/20/2022   GFR 33.98 (L) 06/25/2021   EGFR 29 (L) 02/09/2021   GFRNONAA 34 (L) 09/20/2022   Lab Results  Component Value Date   WBC 10.4 09/20/2022   HGB 9.6 (L) 09/20/2022   HCT 32.6 (L) 09/20/2022   MCV 101.6 (H) 09/20/2022   PLT 189 09/20/2022     Lab Results  Component Value Date   CHOL 130 01/10/2022   HDL 52 01/10/2022   LDLCALC 53 01/10/2022   LDLDIRECT 136.0 10/31/2015   TRIG 125 01/10/2022   CHOLHDL 2.5 01/10/2022   Cxr today DG Chest 2 View  Result  Date: 12/17/2022 CLINICAL DATA:  Chronic cough for 2 years. EXAM: CHEST - 2 VIEW COMPARISON:  December 19, 2017. FINDINGS: The heart size and mediastinal contours are within normal limits. Both lungs are clear. The visualized skeletal structures are unremarkable. IMPRESSION: No active cardiopulmonary disease. Aortic Atherosclerosis (ICD10-I70.0). Electronically Signed   By: Lupita Raider M.D.   On: 12/17/2022 10:21      Patient Active Problem List   Diagnosis Date Noted   Chronic cough 12/17/2022   Dysphagia 12/17/2022   History of atrial fibrillation 03/17/2022   Coronary artery disease of native artery of native heart with stable angina pectoris (HCC) 02/01/2022   Vascular dementia (HCC) 01/10/2022   Dyslipidemia 01/09/2022   Mild cognitive impairment 01/06/2022   Fall 06/24/2021   Syncope and collapse 06/24/2021   Acute pain of both knees 06/24/2021   Light headedness 06/24/2021   Bilateral impacted cerumen 06/24/2021   Heme positive stool 02/24/2021   History of CVA (cerebrovascular accident) 02/14/2020   Headache 02/14/2020   Diarrhea 12/26/2017   Dizziness 12/26/2017   Depression 10/26/2017   H/O subdural hemorrhage 09/18/2017   CAD S/P percutaneous coronary  angioplasty 09/18/2017   Closed fracture of nasal bones    Epistaxis    Elevated transaminase level 08/03/2017   Radial artery injury, left, sequela 06/15/2017   H/O: CVA (cerebrovascular accident) 05/26/2017   Sick sinus syndrome (HCC) 05/25/2017   Chronic kidney disease, stage 3b (HCC) 04/05/2017   Anemia 04/05/2017   Thoracic back pain 09/15/2016   History of radius fracture 06/01/2016   AKI (acute kidney injury) (HCC) 04/25/2016   Leukocytosis 04/25/2016   Status post cervical spinal fusion 08/18/2015   Acute pain of right shoulder 05/16/2015   Osteoarthritis, hip, bilateral 08/12/2014   Hearing loss in right ear 06/12/2014   Colon cancer screening 08/10/2013   Seborrheic keratoses, inflamed 08/10/2013    History of fall 04/10/2013   Encounter for Medicare annual wellness exam 08/08/2012   Gout 07/14/2012   History of endometrial cancer 05/25/2012   Degenerative joint disease of cervical spine 05/03/2011   Other screening mammogram 03/31/2011   Prediabetes 09/26/2007   Obesity (BMI 30-39.9) 07/12/2007   Essential hypertension 07/11/2007   History of cardiomyopathy 07/11/2007   Osteoarthritis 07/11/2007   MIXED INCONTINENCE URGE AND STRESS 07/11/2007   Hypothyroidism 10/05/2006   HYPERCHOLESTEROLEMIA, PURE 10/05/2006   Past Medical History:  Diagnosis Date   Arthritis    OA   CAD (coronary artery disease)    stent December 2018   Cancer [facility])    endometrial   GERD (gastroesophageal reflux disease)    Hearing loss    History of kidney stones    Hyperlipidemia    Hypertension    Hypothyroid    "not in years"   Shortness of breath dyspnea    04/25/17- not anymore   Past Surgical History:  Procedure Laterality Date   ABDOMINAL HYSTERECTOMY     ANTERIOR CERVICAL DECOMP/DISCECTOMY FUSION [name] 08/18/2015   Procedure: C5-6, C6-7 Anterior Cervical Discectomy and Fusion, Allograft, Plate;  Surgeon: Eldred Manges, MD;  Location: [facility] OR;  Service: Orthopedics;  Laterality: [name];   bone spur removed Right    shoulder   BOWEL RESECTION  07/04/2012   Procedure: SMALL BOWEL RESECTION;  Surgeon: Jeannette Corpus, MD;  Location: [facility] ORS;  Service: Gynecology;;   BREAST BIOPSY     CHOLECYSTECTOMY     CORONARY STENT INTERVENTION [name] 04/06/2017   Procedure: CORONARY STENT INTERVENTION;  Surgeon: Iran Ouch, MD;  Location: [facility] INVASIVE CV LAB;  Service: Cardiovascular;  Laterality: [name];   DILATION AND CURETTAGE OF UTERUS  1999   EYE SURGERY Bilateral    cataracts   FALSE ANEURYSM REPAIR Right 04/26/2017   Procedure: 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;  Surgeon: Fransisco Hertz, MD;  Location: [facility] OR;  Service: Vascular;  Laterality: Right;   HAND SURGERY  12/2008   after hand  fx/due to fall   JOINT REPLACEMENT Bilateral 07/1998   knees   KNEE ARTHROPLASTY  05/23/1998   total right   LAPAROTOMY Bilateral 07/04/2012   Procedure: EXPLORATORY LAPAROTOMY TOTAL ABDOMINAL HYSTERECTOMY BILATERAL SALPINGO-OOPHORECTOMY with small bowel resection ;  Surgeon: Jeannette Corpus, MD;  Location: [facility] ORS;  Service: Gynecology;  Laterality: Bilateral;   LEFT HEART CATH AND CORONARY ANGIOGRAPHY [name] 04/06/2017   Procedure: LEFT HEART CATH AND CORONARY ANGIOGRAPHY;  Surgeon: Dolores Patty, MD;  Location: [facility] INVASIVE CV LAB;  Service: Cardiovascular;  Laterality: [name];   polyp removal  1999   TEE WITHOUT CARDIOVERSION [name] 01/12/2022   Procedure: TRANSESOPHAGEAL ECHOCARDIOGRAM (TEE);  Surgeon: Antonieta Iba, MD;  Location: [facility] ORS;  Service: Cardiovascular;  Laterality: [name];   TUBAL LIGATION     Social History   Tobacco Use   Smoking status: Never   Smokeless tobacco: Never  Vaping Use   Vaping status: Never Used  Substance Use Topics   Alcohol use: Never    Alcohol/week: 0.0 standard drinks of alcohol   Drug use: Never   Family History  Problem Relation Age of Onset   Heart disease Father    Heart attack Father    Stroke Father    Heart disease Sister        CABG   Stroke Sister    Cancer Sister    Bladder Cancer Sister    Stroke Sister        "massive brain bleed"   Leukemia Brother    Lung cancer Brother    Leukemia Brother    Stomach cancer Brother    Heart disease Brother        CABG   Dementia Neg Hx    Allergies  Allergen Reactions   Allopurinol Nausea Only   Lipitor [Atorvastatin Calcium] Itching and Other (See Comments)    Leg aches and aching all over   Morphine And Codeine Other (See Comments)    Makes the patient "loopy,"    Simvastatin Other (See Comments)    MUSCLE PAIN   Tape Itching, Rash and Other (See Comments)    "Cannot use paper tape" --- also causes blisters. Use plastic tape   Current Outpatient Medications on File  Prior to Visit  Medication Sig Dispense Refill   acetaminophen (TYLENOL) 500 MG tablet Take 500 mg by mouth every 8 (eight) hours as needed.     apixaban (ELIQUIS) 2.5 MG TABS tablet Take 1 tablet (2.5 mg total) by mouth 2 (two) times daily. 180 tablet 3   Cholecalciferol (VITAMIN D-3) 25 MCG (1000 UT) CAPS Take 1,000 Units by mouth daily.     colchicine 0.6 MG tablet TAKE 1 TABLET BY MOUTH DAILY 90 tablet 1   Cyanocobalamin (VITAMIN B-12 PO) Take 1 tablet by mouth daily.     diclofenac Sodium (VOLTAREN ARTHRITIS PAIN) 1 % GEL Apply 4 g topically 4 (four) times daily. 100 g 11   donepezil (ARICEPT) 5 MG tablet TAKE 1 TABLET BY MOUTH AT BEDTIME 90 tablet 0   DULoxetine (CYMBALTA) 30 MG capsule TAKE ONE CAPSULE BY MOUTH DAILY 90 capsule 3   ferrous sulfate (FEROSUL) 325 (65 FE) MG tablet Take 1 tablet (325 mg total) by mouth daily. 90 tablet 1   lidocaine (LIDODERM) 5 % Place 1 patch onto the skin daily. Remove & Discard patch within 12 hours or as directed by MD 30 patch 11   losartan (COZAAR) 25 MG tablet Take 1 tablet (25 mg total) by mouth daily. 90 tablet 3   Magnesium Chloride (MAG-SR PLUS CALCIUM PO) Take by mouth. Taking 1 tab once for dizziness     multivitamin-iron-minerals-folic acid (CENTRUM) chewable tablet Chew 1 tablet by mouth daily. 90 tablet 0   mupirocin ointment (BACTROBAN) 2 % APPLY TWO TIMES A DAY TO AFFECTED AREA 22 g 0   nitroGLYCERIN (NITROSTAT) 0.4 MG SL tablet Place 1 tablet (0.4 mg total) under the tongue every 5 (five) minutes x 3 doses as needed for chest pain. 25 tablet 12   rosuvastatin (CRESTOR) 20 MG tablet Take 20 mg by mouth at bedtime.     No current facility-administered medications on file prior to visit.  Review of Systems  Constitutional:  Negative for activity change, appetite change, fatigue, fever and unexpected weight change.  HENT:  Positive for trouble swallowing. Negative for congestion, ear pain, rhinorrhea, sinus pressure and sore throat.    Eyes:  Negative for pain, redness and visual disturbance.  Respiratory:  Negative for cough, choking, chest tightness, shortness of breath and wheezing.   Cardiovascular:  Negative for chest pain and palpitations.  Gastrointestinal:  Negative for abdominal pain, blood in stool, constipation and diarrhea.  Endocrine: Negative for polydipsia and polyuria.  Genitourinary:  Negative for dysuria, frequency and urgency.  Musculoskeletal:  Negative for arthralgias, back pain and myalgias.  Skin:  Negative for pallor and rash.  Allergic/Immunologic: Negative for environmental allergies.  Neurological:  Positive for light-headedness and headaches. Negative for dizziness, tremors, seizures, syncope, facial asymmetry and numbness.  Hematological:  Negative for adenopathy. Does not bruise/bleed easily.  Psychiatric/Behavioral:  Negative for decreased concentration and dysphoric mood. The patient is not nervous/anxious.        Objective:   Physical Exam Constitutional:      General: She is not in acute distress.    Appearance: She is well-developed. She is obese.  HENT:     Head: Normocephalic and atraumatic.  Eyes:     Extraocular Movements: Extraocular movements intact.     Conjunctiva/sclera: Conjunctivae normal.     Pupils: Pupils are equal, round, and reactive to light.  Neck:     Thyroid: No thyromegaly.     Vascular: No carotid bruit or JVD.  Cardiovascular:     Rate and Rhythm: Normal rate and regular rhythm.     Heart sounds: Normal heart sounds.     No gallop.  Pulmonary:     Effort: Pulmonary effort is normal. No respiratory distress.     Breath sounds: Normal breath sounds. No wheezing or rales.  Abdominal:     General: There is no distension or abdominal bruit.     Palpations: Abdomen is soft.  Musculoskeletal:     Cervical back: Normal range of motion and neck supple.     Right lower leg: No edema.     Left lower leg: No edema.  Lymphadenopathy:     Cervical: No  cervical adenopathy.  Skin:    General: Skin is warm and dry.     Coloration: Skin is not pale.     Findings: No rash.  Neurological:     Mental Status: She is alert.     Cranial Nerves: No cranial nerve deficit.     Motor: No weakness.     Coordination: Coordination normal.     Deep Tendon Reflexes: Reflexes are normal and symmetric. Reflexes normal.     Comments: Speech is baseline    Psychiatric:        Attention and Perception: Attention normal.        Mood and Affect: Mood normal.        Speech: Speech normal.        Behavior: Behavior normal.        Cognition and Memory: She exhibits impaired recent memory.     Comments: Mood is good Smiling today            Assessment & Plan:   Problem List Items Addressed This Visit       Cardiovascular and Mediastinum   Essential hypertension    With history of stroke-had permissive HTN following last cva   Now taking losartan 25 mg daily  BP: [name])  140/82  Not feeling dizzy  Encouraged balance diet low in processed foods and exercise as tolerated        Digestive   Dysphagia    Suspect related to acid reflux and cough  Will try pepcid 20 mg bid and update  Avoid triggering foods   This did not start with her last stroke       Relevant Orders   DG Chest 2 View (Completed)     Other   Chronic cough - Primary    Ongoing/over a year  Now feels like food gets stuck occational -not chocking  Wonder if GERD is cause Prescription pepcid 20 mg bid to try-will update in a few weeks Encouraged to avoid foods that trigger this Cxr today is clear       Relevant Orders   DG Chest 2 View (Completed)   Headache    History of falls with subdural hem in past and concussion and cervical fracture This headache has persisted since last vall Gradual ipmrovement but not gone Encouraged good hydration  Has neuro appt soon to address further   No more recent stroke symptoms       Light headedness    Blood pressure is  permissably high (not for age) to avoid this BP: (!) 140/82        Mild cognitive impairment    Per family this is fairly stable Continues aricept 5 mg daily  Under care of neurology

## 2022-12-17 NOTE — Patient Instructions (Addendum)
Follow up with neuology next week for the headache   Be careful not to fall - use a walker if you feel unsteady   Chest xray today (for chronic cough)  Also start pepcid 20 mg twice daily (am and pm)  Acid reflux may be causing cough and swallowing problem   Please update [place] (call or mychart) in 2 weeks to tell me how you are doing with cough and swallowing    Try and eat a healthy balanced diet

## 2022-12-19 NOTE — Assessment & Plan Note (Addendum)
Ongoing/over a year  Now feels like food gets stuck occational -not chocking  Wonder if GERD is cause Prescription pepcid 20 mg bid to try-will update in a few weeks Encouraged to avoid foods that trigger this Cxr today is clear

## 2022-12-19 NOTE — Assessment & Plan Note (Signed)
Blood pressure is permissably high (not for age) to avoid this BP: (!) 140/82

## 2022-12-19 NOTE — Assessment & Plan Note (Signed)
History of falls with subdural hem in past and concussion and cervical fracture This headache has persisted since last vall Gradual ipmrovement but not gone Encouraged good hydration  Has neuro appt soon to address further   No more recent stroke symptoms

## 2022-12-19 NOTE — Assessment & Plan Note (Signed)
Suspect related to acid reflux and cough  Will try pepcid 20 mg bid and update  Avoid triggering foods   This did not start with her last stroke

## 2022-12-19 NOTE — Assessment & Plan Note (Signed)
With history of stroke-had permissive HTN following last cva   Now taking losartan 25 mg daily  BP: (!) 140/82  Not feeling dizzy  Encouraged balance diet low in processed foods and exercise as tolerated

## 2022-12-19 NOTE — Assessment & Plan Note (Signed)
Per family this is fairly stable Continues aricept 5 mg daily  Under care of neurology

## 2022-12-20 NOTE — Progress Notes (Unsigned)
PATIENT: Valerie Ramirez DOB: December 09, 1932  REASON FOR VISIT: follow up HISTORY FROM: patient PRIMARY NEUROLOGIST: Dr. Lucia Gaskins  No chief complaint on file.    HISTORY OF PRESENT ILLNESS: Today 12/20/22  JUNELLE Ramirez is a 89 y.o. female with a history of ***. Returns today for follow-up.   Had a fall. CT head negative.    07/27/22: Valerie Ramirez is a 89 y.o. female who has been followed in this office for stroke, neck pain, obstructive sleep apnea on CPAP and memory disturbance. Returns today for follow-up.   Neck pain: Continues to have pain in the neck that radiates to the back of the head. Does not want injections or other medications. Just wants to take tylenol.  Memory: Daughter lives with her. Able to complete all ADLs independently. No longer driving. Daughter manages medications. Continues on Aricpet 10 mg at bedtime.   Stroke: No stroke like symptoms. Taking Plavix. Loop recorder implanted.  Blood pressure is elevated today but they do check it at home.  OSA on CPAP: Compliance report attached to the night.  She does struggle with using the mask.  Leakage shown on CPAP report.  Daughter feels that the mask fits well  but the patient constantly adjusting it.  Does not want to do a mask refitting at this time    HISTORY Valerie Ramirez is a 89 year old pleasant Caucasian lady seen today for initial office consultation visit for stroke.  She is accompanied by her [name].  History is obtained from them and review of electronic medical records.  I have personally reviewed pertinent available imaging films in PACS.  She has past medical history of hypertension, hyperlipidemia, coronary artery disease s/p stents, remote endometrial cancer, kidney stones, prior left PCA stroke who was recently admitted in July 2023.  Multifocal embolic strokes cryptogenic etiology in July 2023 with negative neurovascular work-up as well as another admission 01/09/22 for altered mental status and  slurred speech and found to have small right anterior thalamic and left coronary radiator lacunar infarcts.  MRI at that time also showed right occipital posterior parietal occipital infarct of remote age.  She underwent outpatient 30-day heart monitor from 01/17/2022 through 02/2022 which was negative for cardiac arrhythmia and is now scheduled to undergo loop recorder insertion later this week by Dr.Mesner cardiac electrophysiologist.  Transthoracic echo showed ejection fraction of 60 to 65% and TEE on 01/12/2022 showed no cardiac source of embolism, PFO or clot.  He is currently on Plavix 75 mg daily which is tolerating well without bleeding and only minor bruising.  LDL cholesterol on 01/06/2022 was 155 mg percent.  Hemoglobin A1c on 11/14/2021 was borderline at 6.2.  MR angiogram of the brain on 11/14/2021 showed moderate mid right MCA M1, moderate to severe bilateral PCA P2 and severe left PCA P3 stenosis. She has also noticed mild memory and cognitive difficulties for the last year or so.  She saw Dr. Revonda Humphrey started on Aricept 5 mg which she is tolerating well without any GI or CNS side effects.  The daughter feels that her cognitive difficulties are unchanged and not progressing She also has chronic cervicogenic headaches for which she is being managed by Dr Lucia Gaskins.  She has seen Dr. Cristopher Peru in [place] in the past but now prefers her care here in [place].  REVIEW OF SYSTEMS: Out of a complete 14 system review of symptoms, the patient complains only of the following symptoms, and all other reviewed systems are negative.  ALLERGIES: Allergies  Allergen Reactions   Allopurinol Nausea Only   Lipitor [Atorvastatin Calcium] Itching and Other (See Comments)    Leg aches and aching all over   Morphine And Codeine Other (See Comments)    Makes the patient "loopy,"    Simvastatin Other (See Comments)    MUSCLE PAIN   Tape Itching, Rash and Other (See Comments)    "Cannot use paper tape" --- also  causes blisters. Use plastic tape    HOME MEDICATIONS: Outpatient Medications Prior to Visit  Medication Sig Dispense Refill   acetaminophen (TYLENOL) 500 MG tablet Take 500 mg by mouth every 8 (eight) hours as needed.     apixaban (ELIQUIS) 2.5 MG TABS tablet Take 1 tablet (2.5 mg total) by mouth 2 (two) times daily. 180 tablet 3   Cholecalciferol (VITAMIN D-3) 25 MCG (1000 UT) CAPS Take 1,000 Units by mouth daily.     colchicine 0.6 MG tablet TAKE 1 TABLET BY MOUTH DAILY 90 tablet 1   Cyanocobalamin (VITAMIN B-12 PO) Take 1 tablet by mouth daily.     diclofenac Sodium (VOLTAREN ARTHRITIS PAIN) 1 % GEL Apply 4 g topically 4 (four) times daily. 100 g 11   donepezil (ARICEPT) 5 MG tablet TAKE 1 TABLET BY MOUTH AT BEDTIME 90 tablet 0   DULoxetine (CYMBALTA) 30 MG capsule TAKE ONE CAPSULE BY MOUTH DAILY 90 capsule 3   famotidine (PEPCID) 20 MG tablet Take 1 tablet (20 mg total) by mouth 2 (two) times daily. 60 tablet 3   ferrous sulfate (FEROSUL) 325 (65 FE) MG tablet Take 1 tablet (325 mg total) by mouth daily. 90 tablet 1   lidocaine (LIDODERM) 5 % Place 1 patch onto the skin daily. Remove & Discard patch within 12 hours or as directed by MD 30 patch 11   losartan (COZAAR) 25 MG tablet Take 1 tablet (25 mg total) by mouth daily. 90 tablet 3   Magnesium Chloride (MAG-SR PLUS CALCIUM PO) Take by mouth. Taking 1 tab once for dizziness     multivitamin-iron-minerals-folic acid (CENTRUM) chewable tablet Chew 1 tablet by mouth daily. 90 tablet 0   mupirocin ointment (BACTROBAN) 2 % APPLY TWO TIMES A DAY TO AFFECTED AREA 22 g 0   nitroGLYCERIN (NITROSTAT) 0.4 MG SL tablet Place 1 tablet (0.4 mg total) under the tongue every 5 (five) minutes x 3 doses as needed for chest pain. 25 tablet 12   rosuvastatin (CRESTOR) 20 MG tablet Take 20 mg by mouth at bedtime.     No facility-administered medications prior to visit.    PAST MEDICAL HISTORY: Past Medical History:  Diagnosis Date   Arthritis    OA    CAD (coronary artery disease)    stent December 2018   Cancer [facility])    endometrial   GERD (gastroesophageal reflux disease)    Hearing loss    History of kidney stones    Hyperlipidemia    Hypertension    Hypothyroid    "not in years"   Shortness of breath dyspnea    04/25/17- not anymore    PAST SURGICAL HISTORY: Past Surgical History:  Procedure Laterality Date   ABDOMINAL HYSTERECTOMY     ANTERIOR CERVICAL DECOMP/DISCECTOMY FUSION [name] 08/18/2015   Procedure: C5-6, C6-7 Anterior Cervical Discectomy and Fusion, Allograft, Plate;  Surgeon: Eldred Manges, MD;  Location: [facility] OR;  Service: Orthopedics;  Laterality: [name];   bone spur removed Right    shoulder   BOWEL RESECTION  07/04/2012  Procedure: SMALL BOWEL RESECTION;  Surgeon: Jeannette Corpus, MD;  Location: [facility] ORS;  Service: Gynecology;;   BREAST BIOPSY     CHOLECYSTECTOMY     CORONARY STENT INTERVENTION [name] 04/06/2017   Procedure: CORONARY STENT INTERVENTION;  Surgeon: Iran Ouch, MD;  Location: [facility] INVASIVE CV LAB;  Service: Cardiovascular;  Laterality: [name];   DILATION AND CURETTAGE OF UTERUS  1999   EYE SURGERY Bilateral    cataracts   FALSE ANEURYSM REPAIR Right 04/26/2017   Procedure: 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;  Surgeon: Fransisco Hertz, MD;  Location: [facility] OR;  Service: Vascular;  Laterality: Right;   HAND SURGERY  12/2008   after hand fx/due to fall   JOINT REPLACEMENT Bilateral 07/1998   knees   KNEE ARTHROPLASTY  05/23/1998   total right   LAPAROTOMY Bilateral 07/04/2012   Procedure: EXPLORATORY LAPAROTOMY TOTAL ABDOMINAL HYSTERECTOMY BILATERAL SALPINGO-OOPHORECTOMY with small bowel resection ;  Surgeon: Jeannette Corpus, MD;  Location: [facility] ORS;  Service: Gynecology;  Laterality: Bilateral;   LEFT HEART CATH AND CORONARY ANGIOGRAPHY [name] 04/06/2017   Procedure: LEFT HEART CATH AND CORONARY ANGIOGRAPHY;  Surgeon: Dolores Patty, MD;  Location: [facility] INVASIVE CV LAB;  Service:  Cardiovascular;  Laterality: [name];   polyp removal  1999   TEE WITHOUT CARDIOVERSION [name] 01/12/2022   Procedure: TRANSESOPHAGEAL ECHOCARDIOGRAM (TEE);  Surgeon: Antonieta Iba, MD;  Location: [facility] ORS;  Service: Cardiovascular;  Laterality: [name];   TUBAL LIGATION      FAMILY HISTORY: Family History  Problem Relation Age of Onset   Heart disease Father    Heart attack Father    Stroke Father    Heart disease Sister        CABG   Stroke Sister    Cancer Sister    Bladder Cancer Sister    Stroke Sister        "massive brain bleed"   Leukemia Brother    Lung cancer Brother    Leukemia Brother    Stomach cancer Brother    Heart disease Brother        CABG   Dementia Neg Hx     SOCIAL HISTORY: Social History   Socioeconomic History   Marital status: Widowed    Spouse name: Not on file   Number of children: 2   Years of education: Not on file   Highest education level: High school graduate  Occupational History   Not on file  Tobacco Use   Smoking status: Never   Smokeless tobacco: Never  Vaping Use   Vaping status: Never Used  Substance and Sexual Activity   Alcohol use: Never    Alcohol/week: 0.0 standard drinks of alcohol   Drug use: Never   Sexual activity: Never    Birth control/protection: Post-menopausal  Other Topics Concern   Not on file  Social History Narrative   Not on file   Social Determinants of Health   Financial Resource Strain: Low Risk  (08/11/2022)   Overall Financial Resource Strain (CARDIA)    Difficulty of Paying Living Expenses: Not hard at all  Food Insecurity: No Food Insecurity (08/11/2022)   Hunger Vital Sign    Worried About Running Out of Food in the Last Year: Never true    Ran Out of Food in the Last Year: Never true  Transportation Needs: No Transportation Needs (08/11/2022)   PRAPARE - Administrator, Civil Service (Medical): No    Lack of Transportation (  Non-Medical): No  Physical Activity: Sufficiently Active  (08/11/2022)   Exercise Vital Sign    Days of Exercise per Week: 7 days    Minutes of Exercise per Session: 30 min  Stress: No Stress Concern Present (08/11/2022)   [employer] of Occupational Health - Occupational Stress Questionnaire    Feeling of Stress : Not at all  Social Connections: Moderately Isolated (08/11/2022)   Social Connection and Isolation Panel [NHANES]    Frequency of Communication with Friends and Family: More than three times a week    Frequency of Social Gatherings with Friends and Family: Twice a week    Attends Religious Services: More than 4 times per year    Active Member of [employer] or Organizations: No    Attends Banker Meetings: Never    Marital Status: Widowed  Intimate Partner Violence: Not At Risk (08/11/2022)   Humiliation, Afraid, Rape, and Kick questionnaire    Fear of Current or Ex-Partner: No    Emotionally Abused: No    Physically Abused: No    Sexually Abused: No      PHYSICAL EXAM  There were no vitals filed for this visit.  There is no height or weight on file to calculate BMI.     07/27/2022    9:43 AM 03/02/2022    2:18 PM 01/06/2022   12:26 PM  MMSE - Mini Mental State Exam  Orientation to time 4 5 4   Orientation to Place 3 4 4   Registration 3 3 3   Attention/ Calculation 0 1 1  Recall 1 3 3   Language- name 2 objects 2 2 2   Language- repeat 0 0 1  Language- follow 3 step command 2 3 2   Language- read & follow direction 1 1 1   Write a sentence 0 1 1  Copy design 0 1 0  Total score 16 24 22       Generalized: Well developed, in no acute distress   Neurological examination  Mentation: Alert oriented to time, place, history taking. Follows all commands speech and language fluent Cranial nerve II-XII: Pupils were equal round reactive to light. Extraocular movements were full, visual field were full on confrontational test. Facial sensation and strength were normal. Uvula tongue midline. Head turning and shoulder  shrug  were normal and symmetric. Motor: The motor testing reveals 5 over 5 strength of all 4 extremities. Good symmetric motor tone is noted throughout.  Sensory: Sensory testing is intact to soft touch on all 4 extremities. No evidence of extinction is noted.  Coordination: Cerebellar testing reveals good finger-nose-finger and heel-to-shin bilaterally.  Gait and station: Gait is wide-based.   DIAGNOSTIC DATA (LABS, IMAGING, TESTING) - I reviewed patient records, labs, notes, testing and imaging myself where available.  Lab Results  Component Value Date   WBC 10.4 09/20/2022   HGB 9.6 (L) 09/20/2022   HCT 32.6 (L) 09/20/2022   MCV 101.6 (H) 09/20/2022   PLT 189 09/20/2022      Component Value Date/Time   NA 140 09/20/2022 1756   NA 140 02/09/2021 1202   K 4.3 09/20/2022 1756   CL 109 09/20/2022 1756   CO2 22 09/20/2022 1756   GLUCOSE 124 (H) 09/20/2022 1756   BUN 32 (H) 09/20/2022 1756   BUN 27 02/09/2021 1202   CREATININE 1.49 (H) 09/20/2022 1756   CALCIUM 9.0 09/20/2022 1756   PROT 7.4 09/20/2022 1756   PROT 6.8 02/09/2021 1202   ALBUMIN 3.8 09/20/2022 1756   ALBUMIN 4.2  02/09/2021 1202   AST 24 09/20/2022 1756   ALT 13 09/20/2022 1756   ALKPHOS 96 09/20/2022 1756   BILITOT 0.6 09/20/2022 1756   BILITOT 0.3 02/09/2021 1202   GFRNONAA 34 (L) 09/20/2022 1756   GFRAA 35 (L) 05/01/2020 1156   Lab Results  Component Value Date   CHOL 130 01/10/2022   HDL 52 01/10/2022   LDLCALC 53 01/10/2022   LDLDIRECT 136.0 10/31/2015   TRIG 125 01/10/2022   CHOLHDL 2.5 01/10/2022   Lab Results  Component Value Date   HGBA1C 6.2 (H) 11/14/2021   Lab Results  Component Value Date   VITAMINB12 >2000 (H) 03/02/2022   Lab Results  Component Value Date   TSH 1.690 03/02/2022      ASSESSMENT AND PLAN 89 y.o. year old female  has a past medical history of Arthritis, CAD (coronary artery disease), Cancer (HCC), GERD (gastroesophageal reflux disease), Hearing loss, History  of kidney stones, Hyperlipidemia, Hypertension, Hypothyroid, and Shortness of breath dyspnea. here with:  History of stroke   Continue clopidogrel 75 mg daily for secondary stroke prevention.  Discussed secondary stroke prevention measures and importance of close PCP follow up for aggressive stroke risk factor management. I have gone over the pathophysiology of stroke, warning signs and symptoms, risk factors and their management in some detail with instructions to go to the closest emergency room for symptoms of concern. HTN: BP goal <130/90.  Elevated today.  The patient will recheck it at home.  If it remains elevated she will make her PCP aware HLD: LDL goal <70. R DMII: A1c goal<7.0.   2.  Neck pain  Discussed options such as injections, gabapentin or Lyrica however the patient deferred.  3.  Memory disturbance  MMSE 16/30  Continue Aricept 10 mg at bedtime  4.  Obstructive sleep apnea on CPAP  Report shows good compliance Good treatment of apnea Encouraged patient to use a CPAP nightly greater than 4 hours each night Discussed the mask refitting but they deferred for now   Follow-up in 6-7 months or sooner if needed       Butch Penny, MSN, NP-C 12/20/2022, 10:56 AM [facility] [address], [address] [place], [place] [address] [phone]

## 2022-12-21 ENCOUNTER — Encounter: Payer: Self-pay | Admitting: Adult Health

## 2022-12-21 ENCOUNTER — Ambulatory Visit: Payer: Medicare Other | Admitting: Adult Health

## 2022-12-21 VITALS — BP 189/88 | HR 82 | Ht 62.0 in | Wt 185.4 lb

## 2022-12-21 DIAGNOSIS — S0990XA Unspecified injury of head, initial encounter: Secondary | ICD-10-CM | POA: Diagnosis not present

## 2022-12-21 DIAGNOSIS — W19XXXA Unspecified fall, initial encounter: Secondary | ICD-10-CM

## 2022-12-23 ENCOUNTER — Telehealth: Payer: Self-pay | Admitting: *Deleted

## 2022-12-23 ENCOUNTER — Other Ambulatory Visit: Payer: Self-pay | Admitting: Family Medicine

## 2022-12-23 DIAGNOSIS — R519 Headache, unspecified: Secondary | ICD-10-CM

## 2022-12-23 DIAGNOSIS — H539 Unspecified visual disturbance: Secondary | ICD-10-CM

## 2022-12-23 NOTE — Telephone Encounter (Addendum)
Tried to reach both patient and daughter. Unable to reach anyone. Vm not setup on main # of chart.   ----- Message from Butch Penny sent at 12/21/2022  4:33 PM EDT ----- Please let patient know that I consulted with Dr. Lucia Gaskins. I want to check ESR/CRP. If amenable- she can stop by to have done.

## 2022-12-24 ENCOUNTER — Telehealth: Payer: Self-pay | Admitting: Adult Health

## 2022-12-24 MED ORDER — ROSUVASTATIN CALCIUM 20 MG PO TABS: 20.0000 mg | ORAL_TABLET | Freq: Every day | ORAL | 1 refills | Status: DC

## 2022-12-24 NOTE — Telephone Encounter (Signed)
Spoke to patient's daughter she stated PCP does not want to refill rosuvastatin.Advised mother is past due to see Dr.Jordan.Appointment scheduled with Dr.Jordan 10/15 at 11:40 am.Rosuvastatin refill sent to pharmacy.

## 2022-12-24 NOTE — Telephone Encounter (Signed)
Looks like cardiology fills this med not PCP, will route to them

## 2022-12-24 NOTE — Telephone Encounter (Signed)
I have not been managing the patient's lipids in EP clinic.

## 2022-12-24 NOTE — Telephone Encounter (Signed)
[employer] medicare Berkley Harvey: [id number] exp. 12/24/22-01/22/23 sent to [facility] [phone]

## 2022-12-24 NOTE — Telephone Encounter (Signed)
Looking at refill history PCP has never prescribed med and cardiology has filled all of the prev Rxs. The cardiology declined this refill request. ? If PCP wants to take over med or refill med, please advise   **med is Crestor**

## 2022-12-24 NOTE — Telephone Encounter (Signed)
Patient [name] called in to follow up on this refill. She stated that she don't understand why the cardiologist will prescribe this medication. She would like a call back to discuss this.

## 2022-12-28 NOTE — Addendum Note (Signed)
Addended by: Bertram Savin on: 12/28/2022 09:21 AM   Modules accepted: Orders

## 2022-12-28 NOTE — Telephone Encounter (Signed)
I spoke with [name], daughter. She is amenable to having pt's ESR/CRP checked. She will bring her by the office sometime in the morning this week. Discussed lab hours. Orders have been placed.

## 2023-01-07 ENCOUNTER — Other Ambulatory Visit: Payer: Self-pay | Admitting: Family Medicine

## 2023-01-07 DIAGNOSIS — D509 Iron deficiency anemia, unspecified: Secondary | ICD-10-CM

## 2023-01-10 NOTE — Telephone Encounter (Signed)
Last filled on 07/26/22 #90 with 1 refill, last OV was 12/17/22

## 2023-01-10 NOTE — Telephone Encounter (Signed)
If possible I want to re check cbc and iron level sometime this fall  Numbers were down in June  Lab orders are done

## 2023-01-12 ENCOUNTER — Other Ambulatory Visit (INDEPENDENT_AMBULATORY_CARE_PROVIDER_SITE_OTHER): Payer: Self-pay

## 2023-01-12 DIAGNOSIS — Z0289 Encounter for other administrative examinations: Secondary | ICD-10-CM

## 2023-01-12 DIAGNOSIS — H539 Unspecified visual disturbance: Secondary | ICD-10-CM

## 2023-01-12 DIAGNOSIS — R519 Headache, unspecified: Secondary | ICD-10-CM | POA: Diagnosis not present

## 2023-01-12 NOTE — Telephone Encounter (Signed)
Patient scheduled.

## 2023-01-12 NOTE — Telephone Encounter (Signed)
See prev message, please schedule non fasting lab appt when able

## 2023-01-13 ENCOUNTER — Telehealth: Payer: Self-pay | Admitting: *Deleted

## 2023-01-13 LAB — SEDIMENTATION RATE: Sed Rate: 8 mm/hr (ref 0–40)

## 2023-01-13 LAB — C-REACTIVE PROTEIN: CRP: 2 mg/L (ref 0–10)

## 2023-01-13 NOTE — Telephone Encounter (Addendum)
Spoke to Daughter (checked DPR) Gave pt lab result . Daughter asked me should pt  continue with MRI due to lab results  Informed Daughter yes continue with plan of care . Daughter expressed understanding and thanked me for calling

## 2023-01-13 NOTE — Telephone Encounter (Signed)
-----   Message from Butch Penny sent at 01/13/2023  3:22 PM EDT ----- Lab work unremarkable

## 2023-01-17 ENCOUNTER — Other Ambulatory Visit (INDEPENDENT_AMBULATORY_CARE_PROVIDER_SITE_OTHER): Payer: Medicare Other

## 2023-01-17 ENCOUNTER — Ambulatory Visit: Payer: Self-pay

## 2023-01-17 DIAGNOSIS — D509 Iron deficiency anemia, unspecified: Secondary | ICD-10-CM | POA: Diagnosis not present

## 2023-01-17 LAB — CBC WITH DIFFERENTIAL/PLATELET
Basophils Absolute: 0.1 10*3/uL (ref 0.0–0.1)
Basophils Relative: 1.3 % (ref 0.0–3.0)
Eosinophils Absolute: 0.2 10*3/uL (ref 0.0–0.7)
Eosinophils Relative: 2.6 % (ref 0.0–5.0)
HCT: 37.3 % (ref 36.0–46.0)
Hemoglobin: 12.6 g/dL (ref 12.0–15.0)
Lymphocytes Relative: 20.5 % (ref 12.0–46.0)
Lymphs Abs: 1.7 10*3/uL (ref 0.7–4.0)
MCHC: 33.8 g/dL (ref 30.0–36.0)
MCV: 94.3 fL (ref 78.0–100.0)
Monocytes Absolute: 0.6 10*3/uL (ref 0.1–1.0)
Monocytes Relative: 7.6 % (ref 3.0–12.0)
Neutro Abs: 5.7 10*3/uL (ref 1.4–7.7)
Neutrophils Relative %: 68 % (ref 43.0–77.0)
Platelets: 298 10*3/uL (ref 150.0–400.0)
RBC: 3.96 Mil/uL (ref 3.87–5.11)
RDW: 14.9 % (ref 11.5–15.5)
WBC: 8.4 10*3/uL (ref 4.0–10.5)

## 2023-01-17 LAB — IRON: Iron: 154 ug/dL — ABNORMAL HIGH (ref 42–145)

## 2023-01-18 ENCOUNTER — Encounter: Payer: Self-pay | Admitting: *Deleted

## 2023-01-27 NOTE — Progress Notes (Signed)
Cardiology Office Note    Date:  02/01/2023   ID:  Valerie Ramirez, DOB 11-Mar-1933, MRN [id number]  PCP:  Judy Pimple, MD  Cardiologist:  Dr. Swaziland  Chief Complaint  Patient presents with   Coronary Artery Disease   Atrial Fibrillation    History of Present Illness:  Valerie Ramirez is a 89 y.o. female with PMH of HTN, HLD, hypothyroidism and CAD.  Echocardiogram obtained on 04/05/2017 showed EF 55-60%, grade 2 DD, PA peak pressure 34 mmHg.  She was ruled in for NSTEMI.  She eventually underwent cardiac catheterization on 04/06/2017 which showed a 95% mid left circumflex lesion treated with 3.5 x 28 mm DES, there was a 40% ostial to proximal LAD residual and to 60% proximal RCA residual.  Due to her intolerance of Lipitor and simvastatin, she was started on low-dose Crestor.    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 Imogene Burn on 04/26/2017.  Daughter at the time also mentioned some mental status change immediately after the discharge, they did not seek medical attention at the time. MRI unfortunately did show punctate acute/early subacute microvascular infarct in the right anterior caudate body, chronic right occipital infarction, small chronic lacunar infarct in the right putamen extending into the mid corona radiata.  She was admitted in June 2019 after a fall and facial trauma with nasal fracture and subdural hematoma. DAPT was held. Later resumed as outpatient and repeat CT showed resolution of hematoma. Fall was felt to be related to orthostatic hypotension and antihypertensives were reduced with significant improvement in how she felt.   She did have Covid 19 in July 2021. She had some dizziness and passing out at that time. Medications were reduced- lisinopril was stopped and Coreg reduced.   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 She had a loop recorder placed that did  show Afib. She was started on Eliquis. TEE was fairly normal with only mild MR.   She is seen with her daughter today. She still complains of dizziness and being tired. No bleeding. No stroke symptoms. Denies any chest pain or dyspnea. No longer driving or mowing the grass. She has chronic HA evaluated by Neurology.   Past Medical History:  Diagnosis Date   Arthritis    OA   CAD (coronary artery disease)    stent December 2018   Cancer [facility])    endometrial   GERD (gastroesophageal reflux disease)    Hearing loss    History of kidney stones    Hyperlipidemia    Hypertension    Hypothyroid    "not in years"   Shortness of breath dyspnea    04/25/17- not anymore    Past Surgical History:  Procedure Laterality Date   ABDOMINAL HYSTERECTOMY     ANTERIOR CERVICAL DECOMP/DISCECTOMY FUSION [name] 08/18/2015   Procedure: C5-6, C6-7 Anterior Cervical Discectomy and Fusion, Allograft, Plate;  Surgeon: Eldred Manges, MD;  Location: [facility] OR;  Service: Orthopedics;  Laterality: [name];   bone spur removed Right    shoulder   BOWEL RESECTION  07/04/2012   Procedure: SMALL BOWEL RESECTION;  Surgeon: Jeannette Corpus, MD;  Location: [facility] ORS;  Service: Gynecology;;   BREAST BIOPSY     CHOLECYSTECTOMY     CORONARY STENT INTERVENTION [name] 04/06/2017   Procedure: CORONARY STENT INTERVENTION;  Surgeon: Iran Ouch, MD;  Location: [facility] INVASIVE CV  LAB;  Service: Cardiovascular;  Laterality: [name];   DILATION AND CURETTAGE OF UTERUS  1999   EYE SURGERY Bilateral    cataracts   FALSE ANEURYSM REPAIR Right 04/26/2017   Procedure: 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;  Surgeon: Fransisco Hertz, MD;  Location: [facility] OR;  Service: Vascular;  Laterality: Right;   HAND SURGERY  12/2008   after hand fx/due to fall   JOINT REPLACEMENT Bilateral 07/1998   knees   KNEE ARTHROPLASTY  05/23/1998   total right   LAPAROTOMY Bilateral 07/04/2012   Procedure: EXPLORATORY LAPAROTOMY TOTAL ABDOMINAL HYSTERECTOMY  BILATERAL SALPINGO-OOPHORECTOMY with small bowel resection ;  Surgeon: Jeannette Corpus, MD;  Location: [facility] ORS;  Service: Gynecology;  Laterality: Bilateral;   LEFT HEART CATH AND CORONARY ANGIOGRAPHY [name] 04/06/2017   Procedure: LEFT HEART CATH AND CORONARY ANGIOGRAPHY;  Surgeon: Dolores Patty, MD;  Location: [facility] INVASIVE CV LAB;  Service: Cardiovascular;  Laterality: [name];   polyp removal  1999   TEE WITHOUT CARDIOVERSION [name] 01/12/2022   Procedure: TRANSESOPHAGEAL ECHOCARDIOGRAM (TEE);  Surgeon: Antonieta Iba, MD;  Location: [facility] ORS;  Service: Cardiovascular;  Laterality: [name];   TUBAL LIGATION      Current Medications: Outpatient Medications Prior to Visit  Medication Sig Dispense Refill   Acetaminophen (TYLENOL 8 HOUR PO) Take 3 tablets by mouth daily.     apixaban (ELIQUIS) 2.5 MG TABS tablet Take 1 tablet (2.5 mg total) by mouth 2 (two) times daily. 180 tablet 3   Cholecalciferol (VITAMIN D-3) 25 MCG (1000 UT) CAPS Take 1,000 Units by mouth daily.     colchicine 0.6 MG tablet TAKE 1 TABLET BY MOUTH DAILY 90 tablet 1   Cyanocobalamin (VITAMIN B-12 PO) Take 1 tablet by mouth daily.     diclofenac Sodium (VOLTAREN ARTHRITIS PAIN) 1 % GEL Apply 4 g topically 4 (four) times daily. (Patient taking differently: Apply 4 g topically as needed.) 100 g 11   donepezil (ARICEPT) 5 MG tablet TAKE 1 TABLET BY MOUTH AT BEDTIME (Patient taking differently: Take 10 mg by mouth at bedtime.) 90 tablet 0   DULoxetine (CYMBALTA) 30 MG capsule TAKE ONE CAPSULE BY MOUTH DAILY 90 capsule 3   famotidine (PEPCID) 20 MG tablet Take 1 tablet (20 mg total) by mouth 2 (two) times daily. 60 tablet 3   ferrous sulfate (FEROSUL) 325 (65 FE) MG tablet TAKE 1 TABLET BY MOUTH DAILY (Patient taking differently: Take 325 mg by mouth every other day.) 90 tablet 0   lidocaine (LIDODERM) 5 % Place 1 patch onto the skin daily. Remove & Discard patch within 12 hours or as directed by MD 30 patch 11   losartan  (COZAAR) 25 MG tablet Take 1 tablet (25 mg total) by mouth daily. 90 tablet 3   Magnesium Chloride (MAG-SR PLUS CALCIUM PO) Take by mouth. Taking 1 tab once for dizziness     mupirocin ointment (BACTROBAN) 2 % APPLY TWO TIMES A DAY TO AFFECTED AREA 22 g 0   naproxen sodium (ALEVE) 220 MG tablet Take 220 mg by mouth daily as needed.     nitroGLYCERIN (NITROSTAT) 0.4 MG SL tablet Place 1 tablet (0.4 mg total) under the tongue every 5 (five) minutes x 3 doses as needed for chest pain. 25 tablet 12   rosuvastatin (CRESTOR) 20 MG tablet Take 1 tablet (20 mg total) by mouth at bedtime. 30 tablet 1   multivitamin-iron-minerals-folic acid (CENTRUM) chewable tablet Chew 1 tablet by mouth daily. 90 tablet  0   No facility-administered medications prior to visit.     Allergies:   Allopurinol, Lipitor [atorvastatin calcium], Morphine and codeine, Simvastatin, and Tape   Social History   Socioeconomic History   Marital status: Widowed    Spouse name: Not on file   Number of children: 2   Years of education: Not on file   Highest education level: High school graduate  Occupational History   Not on file  Tobacco Use   Smoking status: Never   Smokeless tobacco: Never  Vaping Use   Vaping status: Never Used  Substance and Sexual Activity   Alcohol use: Never    Alcohol/week: 0.0 standard drinks of alcohol   Drug use: Never   Sexual activity: Never    Birth control/protection: Post-menopausal  Other Topics Concern   Not on file  Social History Narrative   Not on file   Social Determinants of Health   Financial Resource Strain: Low Risk  (08/11/2022)   Overall Financial Resource Strain (CARDIA)    Difficulty of Paying Living Expenses: Not hard at all  Food Insecurity: No Food Insecurity (08/11/2022)   Hunger Vital Sign    Worried About Running Out of Food in the Last Year: Never true    Ran Out of Food in the Last Year: Never true  Transportation Needs: No Transportation Needs (08/11/2022)    PRAPARE - Administrator, Civil Service (Medical): No    Lack of Transportation (Non-Medical): No  Physical Activity: Sufficiently Active (08/11/2022)   Exercise Vital Sign    Days of Exercise per Week: 7 days    Minutes of Exercise per Session: 30 min  Stress: No Stress Concern Present (08/11/2022)   [employer] of Occupational Health - Occupational Stress Questionnaire    Feeling of Stress : Not at all  Social Connections: Moderately Isolated (08/11/2022)   Social Connection and Isolation Panel [NHANES]    Frequency of Communication with Friends and Family: More than three times a week    Frequency of Social Gatherings with Friends and Family: Twice a week    Attends Religious Services: More than 4 times per year    Active Member of [employer] or Organizations: No    Attends Banker Meetings: Never    Marital Status: Widowed     Family History:  The patient's family history includes Bladder Cancer in her sister; Cancer in her sister; Heart attack in her father; Heart disease in her brother, father, and sister; Leukemia in her brother and brother; Lung cancer in her brother; Stomach cancer in her brother; Stroke in her father, sister, and sister.   ROS:   Please see the history of present illness.    ROS All other systems reviewed and are negative.   PHYSICAL EXAM:   VS:  BP 136/80 (BP Location: Left Arm, Cuff Size: Large)   Pulse 80   Ht 5\' 1"  (1.549 m)   Wt 185 lb (83.9 kg)   BMI 34.96 kg/m    GENERAL:  Well appearing obese WF in NAD HEENT:  PERRL, EOMI, sclera are clear. Oropharynx is clear. NECK:  No jugular venous distention, carotid upstroke brisk and symmetric, no bruits, no thyromegaly or adenopathy LUNGS:  Clear to auscultation bilaterally CHEST:  Unremarkable HEART:  RRR,  PMI not displaced or sustained,S1 and S2 within normal limits, no S3, no S4: no clicks, no rubs, no murmurs ABD:  Soft, nontender. BS +, no masses or bruits. No  hepatomegaly, no  splenomegaly EXT:  2 + pulses throughout, tr edema, no cyanosis no clubbing SKIN:  Warm and dry.  No rashes NEURO:  Alert and oriented x 3. Cranial nerves II through XII intact. PSYCH:  Cognitively intact    Wt Readings from Last 3 Encounters:  02/01/23 185 lb (83.9 kg)  12/21/22 185 lb 6.4 oz (84.1 kg)  12/17/22 187 lb (84.8 kg)      Studies/Labs Reviewed:   EKG:  EKG is not done today   Recent Labs: 03/02/2022: TSH 1.690 09/20/2022: ALT 13; BUN 32; Creatinine, Ser 1.49; Potassium 4.3; Sodium 140 01/17/2023: Hemoglobin 12.6; Platelets 298.0 Repeated and verified X2.   Lipid Panel    Component Value Date/Time   CHOL 130 01/10/2022 0621   CHOL 206 (H) 02/09/2021 1202   TRIG 125 01/10/2022 0621   HDL 52 01/10/2022 0621   HDL 53 02/09/2021 1202   CHOLHDL 2.5 01/10/2022 0621   VLDL 25 01/10/2022 0621   LDLCALC 53 01/10/2022 0621   LDLCALC 130 (H) 02/09/2021 1202   LDLDIRECT 136.0 10/31/2015 0959    Additional studies/ records that were reviewed today include:   Cath 04/06/2017 Conclusion      Mid Cx lesion is 95% stenosed. A drug-eluting stent was successfully placed using a STENT SVELTE RX 3.50 X . Post intervention, there is a 0% residual stenosis.   Successful angioplasty and drug-eluting stent placement to the mid left circumflex.      Echo 04/05/2017 LV EF: 55% -   60%   Study Conclusions   - Left ventricle: The cavity size was normal. Wall thickness was   increased in a pattern of severe LVH. Systolic function was   normal. The estimated ejection fraction was in the range of 55%   to 60%. Wall motion was normal; there were no regional wall   motion abnormalities. Doppler parameters are consistent with   pseudonormal left ventricular relaxation (grade 2 diastolic   dysfunction). The E/e&' ratio is >15, suggesting elevated LV   filling pressure. - Aortic valve: Sclerosis without stenosis. Transvalvular velocity   was minimally  increased. Mean gradient (S): 7 mm Hg. - Mitral valve: Mildly thickened leaflets . There was trivial   regurgitation. - Left atrium: Moderately dilated. - Tricuspid valve: There was mild regurgitation. - Pulmonary arteries: PA peak pressure: 34 mm Hg (S). - Inferior vena cava: The vessel was normal in size. The   respirophasic diameter changes were in the normal range (>= 50%),   consistent with normal central venous pressure.   Impressions:   - LVEF 55-60%, severe LVH, normal wall motion, grade 2 DD with   elevated LV filling pressure, aortic valve sclerosis, trivial MR,   moderate LAE, mild TR, RVSP 34 mmHg, normal IVC.    ASSESSMENT:    1. Paroxysmal atrial fibrillation (HCC)   2. Coronary artery disease of native artery of native heart with stable angina pectoris (HCC)   3. Mixed hyperlipidemia   4. Essential hypertension   5. History of CVA (cerebrovascular accident)       PLAN:  In order of problems listed above:  CAD: s/p DES of LCx for NSTEMI in Dec. 2018. Off antiplatelet therapy since on Eliquis. Continue  Crestor. She is asymptomatic.   Hyperlipidemia: On Crestor 20 mg and tolerating well. Last LDL 53.   Hypertension: Blood pressure is satisfactory especially in light of history of orthostasis. Continue low dose losartan  4.    CVA: loop recorder demonstrated PAfib. Now on Eliquis.  Lower dose based on age and renal function  5.   Hypothyroidism  6.   History of orthostatic hypotension. Resolved. Now on very low dose losartan  7. Pafib. Asymptomatic.    Medication Adjustments/Labs and Tests Ordered: Current medicines are reviewed at length with the patient today.  Concerns regarding medicines are outlined above.  Medication changes, Labs and Tests ordered today are listed in the Patient Instructions below. There are no Patient Instructions on file for this visit.    Signed, Briante Loveall Sw[place], MD  02/01/2023 11:53 AM    [facility]  [facility] [address] [place], [place], [place]  [address] Phone: [phone]; Fax: [phone]

## 2023-02-01 ENCOUNTER — Ambulatory Visit: Payer: Medicare Other | Attending: Cardiology | Admitting: Cardiology

## 2023-02-01 ENCOUNTER — Encounter: Payer: Self-pay | Admitting: Cardiology

## 2023-02-01 VITALS — BP 136/80 | HR 80 | Ht 61.0 in | Wt 185.0 lb

## 2023-02-01 DIAGNOSIS — I10 Essential (primary) hypertension: Secondary | ICD-10-CM | POA: Diagnosis not present

## 2023-02-01 DIAGNOSIS — Z8673 Personal history of transient ischemic attack (TIA), and cerebral infarction without residual deficits: Secondary | ICD-10-CM

## 2023-02-01 DIAGNOSIS — E782 Mixed hyperlipidemia: Secondary | ICD-10-CM | POA: Diagnosis not present

## 2023-02-01 DIAGNOSIS — I480 Paroxysmal atrial fibrillation: Secondary | ICD-10-CM

## 2023-02-01 DIAGNOSIS — I25118 Atherosclerotic heart disease of native coronary artery with other forms of angina pectoris: Secondary | ICD-10-CM | POA: Diagnosis not present

## 2023-02-01 NOTE — Patient Instructions (Signed)
Medication Instructions:  Continue all medications *If you need a refill on your cardiac medications before your next appointment, please call your pharmacy*   Lab Work: none If you have labs (blood work) drawn today and your tests are completely normal, you will receive your results only by: MyChart Message (if you have MyChart) OR A paper copy in the mail If you have any lab test that is abnormal or we need to change your treatment, we will call you to review the results.   Testing/Procedures: none   Follow-Up: At [facility], you and your health needs are our priority.  As part of our continuing mission to provide you with exceptional heart care, we have created designated Provider Care Teams.  These Care Teams include your primary Cardiologist (physician) and Advanced Practice Providers (APPs -  Physician Assistants and Nurse Practitioners) who all work together to provide you with the care you need, when you need it.  We recommend signing up for the patient portal called "MyChart".  Sign up information is provided on this After Visit Summary.  MyChart is used to connect with patients for Virtual Visits (Telemedicine).  Patients are able to view lab/test results, encounter notes, upcoming appointments, etc.  Non-urgent messages can be sent to your provider as well.   To learn more about what you can do with MyChart, go to [email or web address].    Your next appointment:   1 year(s). Call office in April 2025 to make appt. For Oct 2025  Provider:   Peter Sw[place]iland, MD

## 2023-02-17 ENCOUNTER — Encounter: Payer: Self-pay | Admitting: Family Medicine

## 2023-02-17 ENCOUNTER — Encounter (INDEPENDENT_AMBULATORY_CARE_PROVIDER_SITE_OTHER): Payer: Self-pay | Admitting: Otolaryngology

## 2023-02-17 ENCOUNTER — Ambulatory Visit (INDEPENDENT_AMBULATORY_CARE_PROVIDER_SITE_OTHER): Payer: Medicare Other | Admitting: Family Medicine

## 2023-02-17 VITALS — BP 151/80 | HR 86 | Temp 98.3°F | Ht 61.0 in | Wt 184.0 lb

## 2023-02-17 DIAGNOSIS — I10 Essential (primary) hypertension: Secondary | ICD-10-CM | POA: Diagnosis not present

## 2023-02-17 DIAGNOSIS — Z23 Encounter for immunization: Secondary | ICD-10-CM

## 2023-02-17 DIAGNOSIS — H6123 Impacted cerumen, bilateral: Secondary | ICD-10-CM

## 2023-02-17 NOTE — Progress Notes (Signed)
Subjective:    Patient ID: Valerie Ramirez, female    DOB: 1932/07/12, 89 y.o.   MRN: [id number]  HPI  Wt Readings from Last 3 Encounters:  02/17/23 184 lb (83.5 kg)  02/01/23 185 lb (83.9 kg)  12/21/22 185 lb 6.4 oz (84.1 kg)   34.77 kg/m  Vitals:   02/17/23 1150 02/17/23 1225  BP: (!) 192/86 (!) 151/80  Pulse: 86   Temp: 98.3 F (36.8 C)   SpO2: 97%    Pt presents for ear fullness  Ears are full  Was outside working last week  Hearing suddenly worsened  Cannot talk on the phone  No pain no cold or congestion   HTN  BP Readings from Last 3 Encounters:  02/17/23 (!) 151/80  02/01/23 136/80  12/21/22 (!) 189/88   Per cardiology Hypertension: Blood pressure is satisfactory especially in light of history of orthostasis. Continue low dose losartan  Losartan 25 mg daily   Per family -has not been taking her medicine   Patient Active Problem List   Diagnosis Date Noted   Chronic cough 12/17/2022   Dysphagia 12/17/2022   History of atrial fibrillation 03/17/2022   Coronary artery disease of native artery of native heart with stable angina pectoris (HCC) 02/01/2022   Vascular dementia (HCC) 01/10/2022   Dyslipidemia 01/09/2022   Mild cognitive impairment 01/06/2022   Fall 06/24/2021   Syncope and collapse 06/24/2021   Acute pain of both knees 06/24/2021   Light headedness 06/24/2021   Bilateral impacted cerumen 06/24/2021   Heme positive stool 02/24/2021   History of CVA (cerebrovascular accident) 02/14/2020   Headache 02/14/2020   Diarrhea 12/26/2017   Dizziness 12/26/2017   Depression 10/26/2017   H/O subdural hemorrhage 09/18/2017   CAD S/P percutaneous coronary angioplasty 09/18/2017   Closed fracture of nasal bones    Epistaxis    Elevated transaminase level 08/03/2017   Radial artery injury, left, sequela 06/15/2017   H/O: CVA (cerebrovascular accident) 05/26/2017   Sick sinus syndrome (HCC) 05/25/2017   Chronic kidney disease, stage 3b (HCC)  04/05/2017   Anemia 04/05/2017   Thoracic back pain 09/15/2016   History of radius fracture 06/01/2016   AKI (acute kidney injury) (HCC) 04/25/2016   Leukocytosis 04/25/2016   Status post cervical spinal fusion 08/18/2015   Acute pain of right shoulder 05/16/2015   Osteoarthritis, hip, bilateral 08/12/2014   Hearing loss in right ear 06/12/2014   Colon cancer screening 08/10/2013   Seborrheic keratoses, inflamed 08/10/2013   History of fall 04/10/2013   Encounter for Medicare annual wellness exam 08/08/2012   Gout 07/14/2012   History of endometrial cancer 05/25/2012   Degenerative joint disease of cervical spine 05/03/2011   Other screening mammogram 03/31/2011   Prediabetes 09/26/2007   Obesity (BMI 30-39.9) 07/12/2007   Essential hypertension 07/11/2007   History of cardiomyopathy 07/11/2007   Osteoarthritis 07/11/2007   MIXED INCONTINENCE URGE AND STRESS 07/11/2007   Hypothyroidism 10/05/2006   HYPERCHOLESTEROLEMIA, PURE 10/05/2006   Past Medical History:  Diagnosis Date   Arthritis    OA   CAD (coronary artery disease)    stent December 2018   Cancer [facility])    endometrial   GERD (gastroesophageal reflux disease)    Hearing loss    History of kidney stones    Hyperlipidemia    Hypertension    Hypothyroid    "not in years"   Shortness of breath dyspnea    04/25/17- not anymore   Past Surgical History:  Procedure Laterality Date   ABDOMINAL HYSTERECTOMY     ANTERIOR CERVICAL DECOMP/DISCECTOMY FUSION [name] 08/18/2015   Procedure: C5-6, C6-7 Anterior Cervical Discectomy and Fusion, Allograft, Plate;  Surgeon: Eldred Manges, MD;  Location: [facility] OR;  Service: Orthopedics;  Laterality: [name];   bone spur removed Right    shoulder   BOWEL RESECTION  07/04/2012   Procedure: SMALL BOWEL RESECTION;  Surgeon: Jeannette Corpus, MD;  Location: [facility] ORS;  Service: Gynecology;;   BREAST BIOPSY     CHOLECYSTECTOMY     CORONARY STENT INTERVENTION [name] 04/06/2017   Procedure:  CORONARY STENT INTERVENTION;  Surgeon: Iran Ouch, MD;  Location: [facility] INVASIVE CV LAB;  Service: Cardiovascular;  Laterality: [name];   DILATION AND CURETTAGE OF UTERUS  1999   EYE SURGERY Bilateral    cataracts   FALSE ANEURYSM REPAIR Right 04/26/2017   Procedure: 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;  Surgeon: Fransisco Hertz, MD;  Location: [facility] OR;  Service: Vascular;  Laterality: Right;   HAND SURGERY  12/2008   after hand fx/due to fall   JOINT REPLACEMENT Bilateral 07/1998   knees   KNEE ARTHROPLASTY  05/23/1998   total right   LAPAROTOMY Bilateral 07/04/2012   Procedure: EXPLORATORY LAPAROTOMY TOTAL ABDOMINAL HYSTERECTOMY BILATERAL SALPINGO-OOPHORECTOMY with small bowel resection ;  Surgeon: Jeannette Corpus, MD;  Location: [facility] ORS;  Service: Gynecology;  Laterality: Bilateral;   LEFT HEART CATH AND CORONARY ANGIOGRAPHY [name] 04/06/2017   Procedure: LEFT HEART CATH AND CORONARY ANGIOGRAPHY;  Surgeon: Dolores Patty, MD;  Location: [facility] INVASIVE CV LAB;  Service: Cardiovascular;  Laterality: [name];   polyp removal  1999   TEE WITHOUT CARDIOVERSION [name] 01/12/2022   Procedure: TRANSESOPHAGEAL ECHOCARDIOGRAM (TEE);  Surgeon: Antonieta Iba, MD;  Location: [facility] ORS;  Service: Cardiovascular;  Laterality: [name];   TUBAL LIGATION     Social History   Tobacco Use   Smoking status: Never   Smokeless tobacco: Never  Vaping Use   Vaping status: Never Used  Substance Use Topics   Alcohol use: Never    Alcohol/week: 0.0 standard drinks of alcohol   Drug use: Never   Family History  Problem Relation Age of Onset   Heart disease Father    Heart attack Father    Stroke Father    Heart disease Sister        CABG   Stroke Sister    Cancer Sister    Bladder Cancer Sister    Stroke Sister        "massive brain bleed"   Leukemia Brother    Lung cancer Brother    Leukemia Brother    Stomach cancer Brother    Heart disease Brother        CABG   Dementia Neg Hx     Allergies  Allergen Reactions   Allopurinol Nausea Only   Lipitor [Atorvastatin Calcium] Itching and Other (See Comments)    Leg aches and aching all over   Morphine And Codeine Other (See Comments)    Makes the patient "loopy,"    Simvastatin Other (See Comments)    MUSCLE PAIN   Tape Itching, Rash and Other (See Comments)    "Cannot use paper tape" --- also causes blisters. Use plastic tape   Current Outpatient Medications on File Prior to Visit  Medication Sig Dispense Refill   Acetaminophen (TYLENOL 8 HOUR PO) Take 3 tablets by mouth daily.     apixaban (ELIQUIS) 2.5 MG TABS tablet  Take 1 tablet (2.5 mg total) by mouth 2 (two) times daily. 180 tablet 3   Cholecalciferol (VITAMIN D-3) 25 MCG (1000 UT) CAPS Take 1,000 Units by mouth daily.     colchicine 0.6 MG tablet TAKE 1 TABLET BY MOUTH DAILY 90 tablet 1   diclofenac Sodium (VOLTAREN ARTHRITIS PAIN) 1 % GEL Apply 4 g topically 4 (four) times daily. (Patient taking differently: Apply 4 g topically as needed.) 100 g 11   donepezil (ARICEPT) 5 MG tablet TAKE 1 TABLET BY MOUTH AT BEDTIME (Patient taking differently: Take 10 mg by mouth at bedtime.) 90 tablet 0   DULoxetine (CYMBALTA) 30 MG capsule TAKE ONE CAPSULE BY MOUTH DAILY 90 capsule 3   famotidine (PEPCID) 20 MG tablet Take 1 tablet (20 mg total) by mouth 2 (two) times daily. 60 tablet 3   ferrous sulfate (FEROSUL) 325 (65 FE) MG tablet TAKE 1 TABLET BY MOUTH DAILY (Patient taking differently: Take 325 mg by mouth every other day.) 90 tablet 0   lidocaine (LIDODERM) 5 % Place 1 patch onto the skin daily. Remove & Discard patch within 12 hours or as directed by MD 30 patch 11   losartan (COZAAR) 25 MG tablet Take 1 tablet (25 mg total) by mouth daily. 90 tablet 3   Magnesium Chloride (MAG-SR PLUS CALCIUM PO) Take by mouth. Taking 1 tab once for dizziness     mupirocin ointment (BACTROBAN) 2 % APPLY TWO TIMES A DAY TO AFFECTED AREA 22 g 0   naproxen sodium (ALEVE) 220 MG tablet  Take 220 mg by mouth daily as needed.     nitroGLYCERIN (NITROSTAT) 0.4 MG SL tablet Place 1 tablet (0.4 mg total) under the tongue every 5 (five) minutes x 3 doses as needed for chest pain. 25 tablet 12   rosuvastatin (CRESTOR) 20 MG tablet Take 1 tablet (20 mg total) by mouth at bedtime. 30 tablet 1   No current facility-administered medications on file prior to visit.    Review of Systems  Constitutional:  Negative for activity change, appetite change, fatigue, fever and unexpected weight change.  HENT:  Positive for hearing loss. Negative for congestion, ear pain, postnasal drip, rhinorrhea, sinus pressure and sore throat.   Eyes:  Negative for pain, redness and visual disturbance.  Respiratory:  Negative for cough, shortness of breath and wheezing.   Cardiovascular:  Negative for chest pain and palpitations.  Gastrointestinal:  Negative for abdominal pain, blood in stool, constipation and diarrhea.  Endocrine: Negative for polydipsia and polyuria.  Genitourinary:  Negative for dysuria, frequency and urgency.  Musculoskeletal:  Negative for arthralgias, back pain and myalgias.  Skin:  Negative for pallor and rash.  Allergic/Immunologic: Negative for environmental allergies.  Neurological:  Negative for dizziness, syncope and headaches.  Hematological:  Negative for adenopathy. Does not bruise/bleed easily.  Psychiatric/Behavioral:  Negative for decreased concentration and dysphoric mood. The patient is not nervous/anxious.        Objective:   Physical Exam Constitutional:      General: She is not in acute distress.    Appearance: Normal appearance. She is obese. She is not ill-appearing or diaphoretic.  HENT:     Right Ear: There is impacted cerumen.     Left Ear: There is impacted cerumen.     Ears:     Comments: Deep impaction of dry cerumen  Unable to irrigate due to discomfort  No redness or swelling of canals    Mouth/Throat:     Mouth: Mucous  membranes are moist.   Eyes:     Extraocular Movements: Extraocular movements intact.     Pupils: Pupils are equal, round, and reactive to light.  Cardiovascular:     Rate and Rhythm: Normal rate and regular rhythm.  Musculoskeletal:     Cervical back: Neck supple.  Neurological:     Mental Status: She is alert.     Cranial Nerves: No cranial nerve deficit.  Psychiatric:        Mood and Affect: Mood normal.           Assessment & Plan:   Problem List Items Addressed This Visit       Cardiovascular and Mediastinum   Essential hypertension    Blood pressure is up today BP: (!) 151/80  Per family-pt has not been taking her medicine  Urged to get back on track  Reviewed last cardiology note  Continue losartan 25 mg daily  Instructed to call if no improvement back on medication No symptms         Nervous and Auditory   Bilateral impacted cerumen - Primary    Bilateral Loss of hearing above and beyond baseline (unable to wear her hearing aides   After consent obt , tried to perform simple irrigation but it was not tolerated (discomfort)  Instructed to start using over the counter debrox 2-3 times per week Ref to ENT for cerumen removal  She goes to [facility]-they will also call for appointment  Update if not starting to improve in a week or if worsening  Watching for ear pain or other symptoms       Relevant Orders   Ambulatory referral to ENT   Other Visit Diagnoses     Need for influenza vaccination       Relevant Orders   Flu Vaccine Trivalent High Dose (Fluad) (Completed)

## 2023-02-17 NOTE — Patient Instructions (Addendum)
Get back on your blood pressure medicine !!! If not improving let me know    Flu shot today   Get/use debrox ear solution as directed 2-3 times per week to loosen ear wax   I placed ENT referral  Call your office ([place]) and get an appointment  Tell them a referral is coming  You need ear wax removal  Alert [place] if any ear pain or new symptoms

## 2023-02-17 NOTE — Assessment & Plan Note (Signed)
Blood pressure is up today BP: (!) 151/80  Per family-pt has not been taking her medicine  Urged to get back on track  Reviewed last cardiology note  Continue losartan 25 mg daily  Instructed to call if no improvement back on medication No symptms

## 2023-02-17 NOTE — Assessment & Plan Note (Signed)
Bilateral Loss of hearing above and beyond baseline (unable to wear her hearing aides   After consent obt , tried to perform simple irrigation but it was not tolerated (discomfort)  Instructed to start using over the counter debrox 2-3 times per week Ref to ENT for cerumen removal  She goes to [facility]-they will also call for appointment  Update if not starting to improve in a week or if worsening  Watching for ear pain or other symptoms

## 2023-02-21 ENCOUNTER — Ambulatory Visit (INDEPENDENT_AMBULATORY_CARE_PROVIDER_SITE_OTHER): Payer: Medicare Other

## 2023-02-21 DIAGNOSIS — H524 Presbyopia: Secondary | ICD-10-CM | POA: Diagnosis not present

## 2023-02-21 DIAGNOSIS — Z8673 Personal history of transient ischemic attack (TIA), and cerebral infarction without residual deficits: Secondary | ICD-10-CM

## 2023-02-21 DIAGNOSIS — H353131 Nonexudative age-related macular degeneration, bilateral, early dry stage: Secondary | ICD-10-CM | POA: Diagnosis not present

## 2023-02-24 LAB — CUP PACEART REMOTE DEVICE CHECK
Date Time Interrogation Session: 20241107094404
Implantable Pulse Generator Implant Date: 20231116
Pulse Gen Serial Number: 511019978

## 2023-03-01 ENCOUNTER — Ambulatory Visit: Payer: Medicare Other | Admitting: Adult Health

## 2023-03-04 ENCOUNTER — Other Ambulatory Visit: Payer: Self-pay | Admitting: Cardiology

## 2023-03-09 ENCOUNTER — Other Ambulatory Visit: Payer: Self-pay | Admitting: Family Medicine

## 2023-03-09 NOTE — Telephone Encounter (Signed)
Last filled on 03/01/22 #90 caps/ 3 refills  Last OV was an acute appt for ear fullness on 02/17/23, no future appts

## 2023-03-12 ENCOUNTER — Other Ambulatory Visit: Payer: Self-pay | Admitting: Family Medicine

## 2023-03-15 NOTE — Progress Notes (Signed)
[facility] LOOP RECORDER

## 2023-03-19 ENCOUNTER — Other Ambulatory Visit: Payer: Self-pay | Admitting: Neurology

## 2023-03-25 DIAGNOSIS — H35033 Hypertensive retinopathy, bilateral: Secondary | ICD-10-CM | POA: Diagnosis not present

## 2023-03-25 DIAGNOSIS — H34831 Tributary (branch) retinal vein occlusion, right eye, with macular edema: Secondary | ICD-10-CM | POA: Diagnosis not present

## 2023-03-25 DIAGNOSIS — H43812 Vitreous degeneration, left eye: Secondary | ICD-10-CM | POA: Diagnosis not present

## 2023-03-25 DIAGNOSIS — H353132 Nonexudative age-related macular degeneration, bilateral, intermediate dry stage: Secondary | ICD-10-CM | POA: Diagnosis not present

## 2023-03-28 ENCOUNTER — Ambulatory Visit (INDEPENDENT_AMBULATORY_CARE_PROVIDER_SITE_OTHER): Payer: Self-pay

## 2023-03-28 DIAGNOSIS — Z8673 Personal history of transient ischemic attack (TIA), and cerebral infarction without residual deficits: Secondary | ICD-10-CM | POA: Diagnosis not present

## 2023-03-29 ENCOUNTER — Encounter: Payer: Self-pay | Admitting: Family Medicine

## 2023-03-30 NOTE — Telephone Encounter (Signed)
A cxr at the time (aug) was ok  Since she did not improve with pepcid I want to refer her to pulmonary  Is that ok with them?

## 2023-03-31 ENCOUNTER — Encounter: Payer: Self-pay | Admitting: Pharmacist

## 2023-03-31 LAB — CUP PACEART REMOTE DEVICE CHECK
Date Time Interrogation Session: 20241212070733
Implantable Pulse Generator Implant Date: 20231116
Pulse Gen Serial Number: 511019978

## 2023-03-31 NOTE — Progress Notes (Signed)
Pharmacy Quality Measure Review  This patient is appearing on a report for being at risk of failing the adherence measure for cholesterol (statin) medications this calendar year.   Medication: rosuvastatin 20 mg tablet Last fill date: 01/07/23 for 60 day supply  Insurance report was not up to date. No action needed at this time.  Medication has been refilled as of 03/25/23 for 90 day supply.

## 2023-04-03 ENCOUNTER — Other Ambulatory Visit: Payer: Self-pay | Admitting: Cardiovascular Disease

## 2023-04-04 NOTE — Telephone Encounter (Signed)
Prescription refill request for Eliquis received. Indication:afib Last office visit:10/24 Scr:1.49  6/24 Age: 89 Weight:83.5  kg  Prescription refilled

## 2023-04-11 DIAGNOSIS — H34831 Tributary (branch) retinal vein occlusion, right eye, with macular edema: Secondary | ICD-10-CM | POA: Diagnosis not present

## 2023-04-18 ENCOUNTER — Other Ambulatory Visit: Payer: Self-pay | Admitting: Family Medicine

## 2023-05-02 ENCOUNTER — Encounter: Payer: Self-pay | Admitting: Family Medicine

## 2023-05-02 ENCOUNTER — Ambulatory Visit (INDEPENDENT_AMBULATORY_CARE_PROVIDER_SITE_OTHER): Payer: Self-pay

## 2023-05-02 ENCOUNTER — Ambulatory Visit (INDEPENDENT_AMBULATORY_CARE_PROVIDER_SITE_OTHER): Payer: Medicare Other | Admitting: Family Medicine

## 2023-05-02 ENCOUNTER — Other Ambulatory Visit: Payer: Self-pay | Admitting: Family Medicine

## 2023-05-02 ENCOUNTER — Ambulatory Visit (INDEPENDENT_AMBULATORY_CARE_PROVIDER_SITE_OTHER)
Admission: RE | Admit: 2023-05-02 | Discharge: 2023-05-02 | Disposition: A | Discharge status: Home / Self Care | Payer: Medicare Other | Source: Ambulatory Visit | Attending: Family Medicine | Admitting: Family Medicine

## 2023-05-02 VITALS — BP 158/84 | HR 81 | Temp 97.5°F | Ht 61.0 in | Wt 182.1 lb

## 2023-05-02 DIAGNOSIS — R051 Acute cough: Secondary | ICD-10-CM | POA: Diagnosis not present

## 2023-05-02 DIAGNOSIS — Z8673 Personal history of transient ischemic attack (TIA), and cerebral infarction without residual deficits: Secondary | ICD-10-CM

## 2023-05-02 DIAGNOSIS — N1832 Chronic kidney disease, stage 3b: Secondary | ICD-10-CM | POA: Diagnosis not present

## 2023-05-02 DIAGNOSIS — I10 Essential (primary) hypertension: Secondary | ICD-10-CM | POA: Diagnosis not present

## 2023-05-02 DIAGNOSIS — R7303 Prediabetes: Secondary | ICD-10-CM

## 2023-05-02 DIAGNOSIS — R053 Chronic cough: Secondary | ICD-10-CM

## 2023-05-02 DIAGNOSIS — I495 Sick sinus syndrome: Secondary | ICD-10-CM

## 2023-05-02 DIAGNOSIS — Z8679 Personal history of other diseases of the circulatory system: Secondary | ICD-10-CM

## 2023-05-02 MED ORDER — PREDNISONE 20 MG PO TABS
ORAL_TABLET | ORAL | 0 refills | Status: DC
Start: 2023-05-02 — End: 2023-09-07

## 2023-05-02 MED ORDER — OMEPRAZOLE 20 MG PO CPDR: 20.0000 mg | DELAYED_RELEASE_CAPSULE | Freq: Every day | ORAL | 0 refills | Status: DC

## 2023-05-02 NOTE — Progress Notes (Signed)
 Ph: [phone] Fax: [phone]   Patient ID: Valerie Ramirez, female    DOB: 09-18-1932, 89 y.o.   MRN: [id number]  This visit was conducted in person.  BP (!) 158/84 (BP Location: Right Arm, Cuff Size: Large)   Pulse 81   Temp (!) 97.5 F (36.4 C) (Oral)   Ht 5' 1 (1.549 m)   Wt 182 lb 2 oz (82.6 kg)   SpO2 98%   BMI 34.41 kg/m    CC: cough Subjective:   HPI: Valerie Ramirez is a 89 y.o. female presenting on 05/02/2023 for Cough (C/o constant dry cough- worse when lying down and SOB. Sxs started about 3 wks ago after being sick. Tried Mucinex- not helpful. Per Darice, pt has had cough for yrs and is worsening. Pt accompanied by daughter, Darice. )   3+ wk h/o persistent cough, worsened over the past week. Chronic cough for 6+ yrs since husband's passing, markedly worse recently. No longer able to lay down due to cough. Unable to sleep at night due to cough - cough comes on when supine but not necessarily associated with orthopnea. Chronic dyspnea worse with any exertion. She coughs with any PO intake. Cough is impeding daughter's sleep as well as pt's. Daughter is at wit's end. Pt minimizes cough.   Denies fevers/chills, wheezing.  No leg swelling.  Non smoker, denies second hand smoke exposure.  No h/o asthma, COPD, GERD.   She takes pepcid  20mg  BID every day - this has not helped cough.  No h/o allergic rhinitis.   CXR 11/2022 - no acute process.  Plan was pulm evaluation - this has not happened.   Tried mucinex DM as well as throat lozenges.   OSA - refused CPAP so daughter returned the machine.  Afib on eliquis , wears heart monitor.  Lab Results  Component Value Date   NA 140 09/20/2022   CL 109 09/20/2022   K 4.3 09/20/2022   CO2 22 09/20/2022   BUN 32 (H) 09/20/2022   CREATININE 1.49 (H) 09/20/2022   GFRNONAA 34 (L) 09/20/2022   CALCIUM  9.0 09/20/2022   PHOS 4.5 11/14/2021   ALBUMIN 3.8 09/20/2022   GLUCOSE 124 (H) 09/20/2022    Lab Results   Component Value Date   WBC 8.4 01/17/2023   HGB 12.6 01/17/2023   HCT 37.3 01/17/2023   MCV 94.3 01/17/2023   PLT 298.0 Repeated and verified X2. 01/17/2023   Lab Results  Component Value Date   TSH 1.690 03/02/2022     CHEST - 2 VIEW COMPARISON:  December 19, 2017. FINDINGS: The heart size and mediastinal contours are within normal limits. Both lungs are clear. The visualized skeletal structures are unremarkable. IMPRESSION: No active cardiopulmonary disease. Aortic Atherosclerosis (ICD10-I70.0). Electronically Signed   By: Lynwood Landy Raddle M.D.   On: 12/17/2022 10:21    Relevant past medical, surgical, family and social history reviewed and updated as indicated. Interim medical history since our last visit reviewed. Allergies and medications reviewed and updated. Outpatient Medications Prior to Visit  Medication Sig Dispense Refill   Acetaminophen  (TYLENOL  8 HOUR PO) Take 3 tablets by mouth daily.     Cholecalciferol (VITAMIN D-3) 25 MCG (1000 UT) CAPS Take 1,000 Units by mouth daily.     colchicine  0.6 MG tablet TAKE 1 TABLET BY MOUTH DAILY 90 tablet 1   diclofenac  Sodium (VOLTAREN  ARTHRITIS PAIN) 1 % GEL Apply 4 g topically 4 (four) times daily. (Patient taking differently: Apply 4 g  topically as needed.) 100 g 11   donepezil  (ARICEPT ) 10 MG tablet Take 1 tablet (10 mg total) by mouth at bedtime. 90 tablet 1   DULoxetine  (CYMBALTA ) 30 MG capsule TAKE 1 CAPSULE BY MOUTH DAILY 90 capsule 3   ELIQUIS  2.5 MG TABS tablet TAKE 1 TABLET BY MOUTH TWICE A DAY 180 tablet 3   famotidine  (PEPCID ) 20 MG tablet TAKE 1 TABLET BY MOUTH 2 TIMES A DAY 180 tablet 0   ferrous sulfate  (FEROSUL) 325 (65 FE) MG tablet TAKE 1 TABLET BY MOUTH DAILY (Patient taking differently: Take 325 mg by mouth every other day.) 90 tablet 0   lidocaine  (LIDODERM ) 5 % Place 1 patch onto the skin daily. Remove & Discard patch within 12 hours or as directed by MD 30 patch 11   losartan  (COZAAR ) 25 MG tablet TAKE  1 TABLET BY MOUTH DAILY 90 tablet 3   Magnesium Chloride (MAG-SR PLUS CALCIUM  PO) Take by mouth. Taking 1 tab once for dizziness     mupirocin  ointment (BACTROBAN ) 2 % APPLY TWO TIMES A DAY TO AFFECTED AREA 22 g 0   naproxen sodium (ALEVE) 220 MG tablet Take 220 mg by mouth daily as needed.     nitroGLYCERIN  (NITROSTAT ) 0.4 MG SL tablet Place 1 tablet (0.4 mg total) under the tongue every 5 (five) minutes x 3 doses as needed for chest pain. 25 tablet 12   rosuvastatin  (CRESTOR ) 20 MG tablet TAKE 1 TABLET BY MOUTH AT BEDTIME 90 tablet 3   No facility-administered medications prior to visit.     Per HPI unless specifically indicated in ROS section below Review of Systems  Objective:  BP (!) 158/84 (BP Location: Right Arm, Cuff Size: Large)   Pulse 81   Temp (!) 97.5 F (36.4 C) (Oral)   Ht 5' 1 (1.549 m)   Wt 182 lb 2 oz (82.6 kg)   SpO2 98%   BMI 34.41 kg/m   Wt Readings from Last 3 Encounters:  05/02/23 182 lb 2 oz (82.6 kg)  02/17/23 184 lb (83.5 kg)  02/01/23 185 lb (83.9 kg)      Physical Exam Vitals and nursing note reviewed.  Constitutional:      Appearance: Normal appearance. She is not ill-appearing.  HENT:     Head: Normocephalic and atraumatic.     Right Ear: Tympanic membrane, ear canal and external ear normal. There is no impacted cerumen.     Left Ear: Tympanic membrane, ear canal and external ear normal. There is no impacted cerumen.     Nose:     Comments: Wearing mask    Mouth/Throat:     Mouth: Mucous membranes are moist.     Pharynx: Oropharynx is clear. No oropharyngeal exudate or posterior oropharyngeal erythema.  Eyes:     Extraocular Movements: Extraocular movements intact.     Conjunctiva/sclera: Conjunctivae normal.     Pupils: Pupils are equal, round, and reactive to light.  Cardiovascular:     Rate and Rhythm: Normal rate and regular rhythm.     Pulses: Normal pulses.     Heart sounds: Normal heart sounds. No murmur heard. Pulmonary:      Effort: Pulmonary effort is normal. No respiratory distress.     Breath sounds: Normal breath sounds. No wheezing, rhonchi or rales.     Comments: Lungs clear, but repetitive dry nagging cough is present Musculoskeletal:     Cervical back: Neck supple.     Right lower leg: No edema.  Left lower leg: No edema.  Lymphadenopathy:     Head:     Right side of head: No submental, submandibular, tonsillar, preauricular or posterior auricular adenopathy.     Left side of head: No submental, submandibular, tonsillar, preauricular or posterior auricular adenopathy.     Cervical: No cervical adenopathy.     Right cervical: No superficial cervical adenopathy.    Left cervical: No superficial cervical adenopathy.     Upper Body:     Right upper body: No supraclavicular adenopathy.     Left upper body: No supraclavicular adenopathy.  Skin:    General: Skin is warm and dry.     Findings: No rash.  Neurological:     Mental Status: She is alert.  Psychiatric:        Mood and Affect: Mood normal.        Behavior: Behavior normal.        Assessment & Plan:   Problem List Items Addressed This Visit     Prediabetes   Reviewed steroid precautions in prediabetic       Essential hypertension   BP elevated today despite losartan  25mg  daily - BP log sheet provided today with instructions to monitor blood pressures at home over next few weeks and drop off readings to review.       Chronic kidney disease, stage 3b (HCC)   Latest GFR 30s.  Discussed this with pt/ daughter.  Should do ok with short 2-3 wk course of omeprazole  20mg  daily.       Sick sinus syndrome (HCC)   History of atrial fibrillation   Continue eliquis .       Chronic cough - Primary   Acute on chronic cough. Chronic cough for 1+ year, acutely worse after what sounds like recent URI. Daughter is more exasperated than patient.  Anticipate component of post-infectious cough. Not consistent with bacterial infection, asthma,  COPD. No signs of ILD or CHF on exam.  Given chronicity, acute acute worsening, update CXR. Refer to pulm.  Rx prednisone  taper with steroid precautions specifically monitoring for insomnia and hyperglycemia (no h/o diabetes).  Rx omeprazole  20mg  daily x 3 weeks in case GERD component.  Recommend trial OTC cough syrup such as robitussin or delsym.  She is on losartan  - this could rarely cause cough.  Rec f/u with PCP as well as due for physical.       Relevant Orders   DG Chest 2 View   Ambulatory referral to Pulmonology     Meds ordered this encounter  Medications   predniSONE  (DELTASONE ) 20 MG tablet    Sig: Take two tablets daily for 3 days followed by one tablet daily for 4 days    Dispense:  10 tablet    Refill:  0   omeprazole  (PRILOSEC) 20 MG capsule    Sig: Take 1 capsule (20 mg total) by mouth daily. For 3 weeks then as needed    Dispense:  30 capsule    Refill:  0    Orders Placed This Encounter  Procedures   DG Chest 2 View    Standing Status:   Future    Number of Occurrences:   1    Expiration Date:   05/01/2024    Reason for Exam (SYMPTOM  OR DIAGNOSIS REQUIRED):   acute on chronic cough    Preferred imaging location?:   [facility]   Ambulatory referral to Pulmonology    Referral Priority:   Routine  Referral Type:   Consultation    Referral Reason:   Specialty Services Required    Requested Specialty:   Pulmonary Disease    Number of Visits Requested:   1    Patient Instructions  Chest xray today  Take prednisone  taper sent to pharmacy Take omeprazole  20mg  daily for 3 weeks.  Try over the counter cough syrup like robitussin or delsym We will refer you to lung doctor Schedule physical with Dr Randeen as you're due.   Keep track of blood pressures at home with BP log sheet provided today, drop off in 1-2 weeks to review.   Follow up plan: No follow-ups on file.  Anton Blas, MD

## 2023-05-02 NOTE — Assessment & Plan Note (Signed)
 BP elevated today despite losartan 25mg  daily - BP log sheet provided today with instructions to monitor blood pressures at home over next few weeks and drop off readings to review.

## 2023-05-02 NOTE — Assessment & Plan Note (Signed)
 Reviewed steroid precautions in prediabetic

## 2023-05-02 NOTE — Assessment & Plan Note (Addendum)
 Latest GFR 30s.  Discussed this with pt/ daughter.  Should do ok with short 2-3 wk course of omeprazole 20mg  daily.

## 2023-05-02 NOTE — Assessment & Plan Note (Signed)
 Continue eliquis  ?

## 2023-05-02 NOTE — Patient Instructions (Addendum)
 Chest xray today  Take prednisone  taper sent to pharmacy Take omeprazole  20mg  daily for 3 weeks.  Try over the counter cough syrup like robitussin or delsym We will refer you to lung doctor Schedule physical with Dr Randeen as you're due.   Keep track of blood pressures at home with BP log sheet provided today, drop off in 1-2 weeks to review.

## 2023-05-02 NOTE — Assessment & Plan Note (Addendum)
 Acute on chronic cough. Chronic cough for 1+ year, acutely worse after what sounds like recent URI. Daughter is more exasperated than patient.  Anticipate component of post-infectious cough. Not consistent with bacterial infection, asthma, COPD. No signs of ILD or CHF on exam.  Given chronicity, acute acute worsening, update CXR. Refer to pulm.  Rx prednisone  taper with steroid precautions specifically monitoring for insomnia and hyperglycemia (no h/o diabetes).  Rx omeprazole  20mg  daily x 3 weeks in case GERD component.  Recommend trial OTC cough syrup such as robitussin or delsym.  She is on losartan  - this could rarely cause cough.  Rec f/u with PCP as well as due for physical.

## 2023-05-03 LAB — CUP PACEART REMOTE DEVICE CHECK
Date Time Interrogation Session: 20250113120024
Implantable Pulse Generator Implant Date: 20231116
Pulse Gen Serial Number: 511019978

## 2023-05-03 NOTE — Telephone Encounter (Signed)
 Last filled on 10/07/22 #90 tabs/ 1 refill  Last OV was for hearing issues on 02/17/23

## 2023-05-10 DIAGNOSIS — H34831 Tributary (branch) retinal vein occlusion, right eye, with macular edema: Secondary | ICD-10-CM | POA: Diagnosis not present

## 2023-05-14 ENCOUNTER — Encounter: Payer: Self-pay | Admitting: Cardiovascular Disease

## 2023-05-17 ENCOUNTER — Institutional Professional Consult (permissible substitution): Payer: Medicare Other | Admitting: Student in an Organized Health Care Education/Training Program

## 2023-05-28 ENCOUNTER — Other Ambulatory Visit: Payer: Self-pay | Admitting: Family Medicine

## 2023-06-06 ENCOUNTER — Ambulatory Visit (INDEPENDENT_AMBULATORY_CARE_PROVIDER_SITE_OTHER): Payer: Self-pay

## 2023-06-06 DIAGNOSIS — Z8673 Personal history of transient ischemic attack (TIA), and cerebral infarction without residual deficits: Secondary | ICD-10-CM | POA: Diagnosis not present

## 2023-06-06 DIAGNOSIS — I495 Sick sinus syndrome: Secondary | ICD-10-CM

## 2023-06-07 LAB — CUP PACEART REMOTE DEVICE CHECK
Date Time Interrogation Session: 20250217080107
Implantable Pulse Generator Implant Date: 20231116
Pulse Gen Serial Number: 511019978

## 2023-06-14 DIAGNOSIS — H34831 Tributary (branch) retinal vein occlusion, right eye, with macular edema: Secondary | ICD-10-CM | POA: Diagnosis not present

## 2023-06-14 NOTE — Progress Notes (Signed)
 [address] [address] [employer]

## 2023-06-14 NOTE — Addendum Note (Signed)
 Addended by: Geralyn Flash D on: 06/14/2023 09:23 AM   Modules accepted: Orders

## 2023-06-19 ENCOUNTER — Encounter: Payer: Self-pay | Admitting: Cardiovascular Disease

## 2023-06-20 ENCOUNTER — Encounter: Payer: Self-pay | Admitting: Internal Medicine

## 2023-06-20 ENCOUNTER — Ambulatory Visit: Payer: Medicare Other | Admitting: Internal Medicine

## 2023-06-20 VITALS — BP 150/86 | HR 82 | Temp 97.6°F | Ht 61.0 in | Wt 180.8 lb

## 2023-06-20 DIAGNOSIS — Z8616 Personal history of COVID-19: Secondary | ICD-10-CM

## 2023-06-20 DIAGNOSIS — R053 Chronic cough: Secondary | ICD-10-CM | POA: Diagnosis not present

## 2023-06-20 DIAGNOSIS — Z7722 Contact with and (suspected) exposure to environmental tobacco smoke (acute) (chronic): Secondary | ICD-10-CM | POA: Diagnosis not present

## 2023-06-20 MED ORDER — ALBUTEROL SULFATE HFA 108 (90 BASE) MCG/ACT IN AERS
2.0000 | INHALATION_SPRAY | RESPIRATORY_TRACT | 2 refills | Status: DC | PRN
Start: 2023-06-20 — End: 2023-12-23

## 2023-06-20 NOTE — Patient Instructions (Signed)
 Recommend speech evaluation to assess for swallowing  Recommend starting albuterol inhaler as needed for cough Continue over-the-counter medications for cough suppressant  Avoid Allergens and Irritants Avoid secondhand smoke Avoid SICK contacts Recommend  Masking  when appropriate Recommend Keep up-to-date with vaccinations

## 2023-06-20 NOTE — Progress Notes (Signed)
 [facility] [place] Pulmonary Medicine Consultation      Date: 06/20/2023,   MRN# [id number] Valerie Ramirez 12-24-1932  CHIEF COMPLAINT:   Assessment of cough   HISTORY OF PRESENT ILLNESS   89 year old pleasant female seen today for chronic cough for 1 year, after further evaluation she had a chronic cough for 10 years Her cough worsened with the recent upper respiratory tract infection  Chest x-ray in January 2025 did not show any significant abnormalities No evidence of pneumonia no evidence of effusions  With recent upper respiratory tract infection patient was given prednisone Prednisone seem to have helped her cough Patient does not use any inhalers at this time Patient uses ARB for hypertension No evidence of ACE inhibitor's in her medication profile  Patient has a significant history of secondhand smoke exposure   No exacerbation at this time No evidence of heart failure at this time No evidence or signs of infection at this time No respiratory distress No fevers, chills, nausea, vomiting, diarrhea No evidence of lower extremity edema No evidence hemoptysis  No history of GERD No history of sinus congestion or allergic rhinitis OTC cough syrup does help her cough  There is evidence that patient does cough every time she eats and drink something I have advised that we do a speech evaluation and therapy consultation  Cardiac history Patient with sick sinus syndrome history of A-fib Patient on systemic oral anticoagulation therapy     PAST MEDICAL HISTORY   Past Medical History:  Diagnosis Date   Arthritis    OA   CAD (coronary artery disease)    stent December 2018   Cancer [facility])    endometrial   GERD (gastroesophageal reflux disease)    Hearing loss    History of kidney stones    Hyperlipidemia    Hypertension    Hypothyroid    "not in years"   Shortness of breath dyspnea    04/25/17- not anymore     SURGICAL HISTORY   Past Surgical History:   Procedure Laterality Date   ABDOMINAL HYSTERECTOMY     ANTERIOR CERVICAL DECOMP/DISCECTOMY FUSION [name] 08/18/2015   Procedure: C5-6, C6-7 Anterior Cervical Discectomy and Fusion, Allograft, Plate;  Surgeon: Eldred Manges, MD;  Location: [facility] OR;  Service: Orthopedics;  Laterality: [name];   bone spur removed Right    shoulder   BOWEL RESECTION  07/04/2012   Procedure: SMALL BOWEL RESECTION;  Surgeon: Jeannette Corpus, MD;  Location: [facility] ORS;  Service: Gynecology;;   BREAST BIOPSY     CHOLECYSTECTOMY     CORONARY STENT INTERVENTION [name] 04/06/2017   Procedure: CORONARY STENT INTERVENTION;  Surgeon: Iran Ouch, MD;  Location: [facility] INVASIVE CV LAB;  Service: Cardiovascular;  Laterality: [name];   DILATION AND CURETTAGE OF UTERUS  1999   EYE SURGERY Bilateral    cataracts   FALSE ANEURYSM REPAIR Right 04/26/2017   Procedure: 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;  Surgeon: Fransisco Hertz, MD;  Location: [facility] OR;  Service: Vascular;  Laterality: Right;   HAND SURGERY  12/2008   after hand fx/due to fall   JOINT REPLACEMENT Bilateral 07/1998   knees   KNEE ARTHROPLASTY  05/23/1998   total right   LAPAROTOMY Bilateral 07/04/2012   Procedure: EXPLORATORY LAPAROTOMY TOTAL ABDOMINAL HYSTERECTOMY BILATERAL SALPINGO-OOPHORECTOMY with small bowel resection ;  Surgeon: Jeannette Corpus, MD;  Location: [facility] ORS;  Service: Gynecology;  Laterality: Bilateral;   LEFT HEART CATH AND CORONARY ANGIOGRAPHY [name] 04/06/2017  Procedure: LEFT HEART CATH AND CORONARY ANGIOGRAPHY;  Surgeon: Dolores Patty, MD;  Location: [facility] INVASIVE CV LAB;  Service: Cardiovascular;  Laterality: [name];   polyp removal  1999   TEE WITHOUT CARDIOVERSION [name] 01/12/2022   Procedure: TRANSESOPHAGEAL ECHOCARDIOGRAM (TEE);  Surgeon: Antonieta Iba, MD;  Location: [facility] ORS;  Service: Cardiovascular;  Laterality: [name];   TUBAL LIGATION       FAMILY HISTORY   Family History  Problem Relation Age of Onset   Heart disease  Father    Heart attack Father    Stroke Father    Heart disease Sister        CABG   Stroke Sister    Cancer Sister    Bladder Cancer Sister    Stroke Sister        "massive brain bleed"   Leukemia Brother    Lung cancer Brother    Leukemia Brother    Stomach cancer Brother    Heart disease Brother        CABG   Dementia Neg Hx      SOCIAL HISTORY   Social History   Tobacco Use   Smoking status: Never   Smokeless tobacco: Never  Vaping Use   Vaping status: Never Used  Substance Use Topics   Alcohol use: Never    Alcohol/week: 0.0 standard drinks of alcohol   Drug use: Never     MEDICATIONS    Home Medication:  Current Outpatient Rx   Order #: [id number] Class: Historical Med   Order #: [id number] Class: Historical Med   Order #: [id number] Class: Normal   Order #: [id number] Class: Normal   Order #: [id number] Class: Normal   Order #: [id number] Class: Normal   Order #: [id number] Class: Normal   Order #: [id number] Class: Normal   Order #: [id number] Class: Normal   Order #: [id number] Class: Normal   Order #: [id number] Class: Normal   Order #: [id number] Class: Historical Med   Order #: [id number] Class: Normal   Order #: [id number] Class: Historical Med   Order #: [id number] Class: Normal   Order #: [id number] Class: Normal   Order #: [id number] Class: Normal   Order #: [id number] Class: Normal    Current Medication:  Current Outpatient Medications:    Acetaminophen (TYLENOL 8 HOUR PO), Take 3 tablets by mouth daily., Disp: , Rfl:    Cholecalciferol (VITAMIN D-3) 25 MCG (1000 UT) CAPS, Take 1,000 Units by mouth daily., Disp: , Rfl:    colchicine 0.6 MG tablet, TAKE 1 TABLET BY MOUTH DAILY, Disp: 90 tablet, Rfl: 1   diclofenac Sodium (VOLTAREN ARTHRITIS PAIN) 1 % GEL, Apply 4 g topically 4 (four) times daily. (Patient taking differently: Apply 4 g topically as needed.), Disp: 100 g, Rfl: 11   donepezil (ARICEPT) 10 MG tablet, Take 1 tablet (10 mg total) by mouth at bedtime., Disp:  90 tablet, Rfl: 1   DULoxetine (CYMBALTA) 30 MG capsule, TAKE 1 CAPSULE BY MOUTH DAILY, Disp: 90 capsule, Rfl: 3   ELIQUIS 2.5 MG TABS tablet, TAKE 1 TABLET BY MOUTH TWICE A DAY, Disp: 180 tablet, Rfl: 3   famotidine (PEPCID) 20 MG tablet, TAKE 1 TABLET BY MOUTH 2 TIMES A DAY, Disp: 180 tablet, Rfl: 0   ferrous sulfate (FEROSUL) 325 (65 FE) MG tablet, TAKE 1 TABLET BY MOUTH DAILY (Patient taking differently: Take 325 mg by mouth every other day.), Disp: 90 tablet, Rfl: 0   lidocaine (LIDODERM) 5 %, Place 1 patch onto the skin daily. Remove & Discard patch within 12 hours or as directed by  MD, Disp: 30 patch, Rfl: 11   losartan (COZAAR) 25 MG tablet, TAKE 1 TABLET BY MOUTH DAILY, Disp: 90 tablet, Rfl: 3   Magnesium Chloride (MAG-SR PLUS CALCIUM PO), Take by mouth. Taking 1 tab once for dizziness, Disp: , Rfl:    mupirocin ointment (BACTROBAN) 2 %, APPLY TWO TIMES A DAY TO AFFECTED AREA, Disp: 22 g, Rfl: 0   naproxen sodium (ALEVE) 220 MG tablet, Take 220 mg by mouth daily as needed., Disp: , Rfl:    nitroGLYCERIN (NITROSTAT) 0.4 MG SL tablet, Place 1 tablet (0.4 mg total) under the tongue every 5 (five) minutes x 3 doses as needed for chest pain., Disp: 25 tablet, Rfl: 12   omeprazole (PRILOSEC) 20 MG capsule, Take 1 capsule (20 mg total) by mouth daily as needed., Disp: 90 capsule, Rfl: 0   predniSONE (DELTASONE) 20 MG tablet, Take two tablets daily for 3 days followed by one tablet daily for 4 days, Disp: 10 tablet, Rfl: 0   rosuvastatin (CRESTOR) 20 MG tablet, TAKE 1 TABLET BY MOUTH AT BEDTIME, Disp: 90 tablet, Rfl: 3    ALLERGIES   Allopurinol, Lipitor [atorvastatin calcium], Morphine and codeine, Simvastatin, and Tape     REVIEW OF SYSTEMS    Review of Systems:  Gen:  Denies  fever, sweats, chills weigh loss  HEENT: Denies blurred vision, double vision, ear pain, eye pain, hearing loss, nose bleeds, sore throat Cardiac:  No dizziness, chest pain or heaviness, chest  tightness,edema Resp:   Denies cough or sputum porduction, shortness of breath,wheezing, hemoptysis,  Gi: Denies swallowing difficulty, stomach pain, nausea or vomiting, diarrhea, constipation, bowel incontinence Gu:  Denies bladder incontinence, burning urine Ext:   Denies Joint pain, stiffness or swelling Skin: Denies  skin rash, easy bruising or bleeding or hives Endoc:  Denies polyuria, polydipsia , polyphagia or weight change Psych:   Denies depression, insomnia or hallucinations   Other:  All other systems negative   BP (!) 150/86 (BP Location: Left Arm, Patient Position: Sitting, Cuff Size: Normal)   Pulse 82   Temp 97.6 F (36.4 C) (Temporal)   Ht 5\' 1"  (1.549 m)   Wt 180 lb 12.8 oz (82 kg)   SpO2 97%   BMI 34.16 kg/m   PHYSICAL EXAM  General Appearance: No distress  EYES PERRLA, EOM intact.   NECK Supple, No JVD Pulmonary: normal breath sounds, No wheezing.  CardiovascularNormal S1,S2.  No m/r/g.   Abdomen: Benign, Soft, non-tender. Skin:   warm, no rashes, no ecchymosis  Extremities: normal, no cyanosis, clubbing. Neuro:without focal findings,  speech normal  PSYCHIATRIC: Mood, affect within normal limits.   ALL OTHER ROS ARE NEGATIVE      IMAGING    CUP PACEART REMOTE DEVICE CHECK Result Date: 06/07/2023 ILR summary report received. Battery status OK. Normal device function. No new symptom, tachy, brady, or pause episodes. 24 AF episodes, burden 6%, longest 4 hours, 13 minutes, controlled ventricular rates ,on Eliquis per PA report. Monthly summary reports and ROV/PRN [place], CVRS     ASSESSMENT/PLAN   89 year old pleasant white female seen today for chronic cough for more than 10 years exacerbated by COVID 2 years ago and then exacerbated by upper respiratory tract infection 8 weeks ago which was prednisone responsive and also responsive to over-the-counter medications, in the setting of obesity and deconditioned state with extensive secondhand smoke  exposure which could lead to probable underlying chronic bronchitis  For chronic cough Patient does not have any evidence of GERD  or allergic rhinitis Continue over-the-counter medications for cough suppressant Will start albuterol inhaler as needed Obtain speech evaluation for consultation to assess swallowing To assess for aspiration  MEDICATION ADJUSTMENTS/LABS AND TESTS ORDERED: Recommend speech evaluation to assess for swallowing Recommend starting albuterol inhaler as needed for cough Continue over-the-counter medications for cough suppressant Avoid Allergens and Irritants Avoid secondhand smoke Avoid SICK contacts Recommend  Masking  when appropriate Recommend Keep up-to-date with vaccinations   CURRENT MEDICATIONS REVIEWED AT LENGTH WITH PATIENT TODAY   Patient  satisfied with Plan of action and management. All questions answered  Follow up 3 months  I spent a total of 62 minutes reviewing chart data, face-to-face evaluation with the patient, counseling and coordination of care as detailed above.     Lucie Leather, M.D.  [facility]  Medical Director [facility] Medical Director [facility] Cardio-Pulmonary Department

## 2023-07-11 ENCOUNTER — Ambulatory Visit (INDEPENDENT_AMBULATORY_CARE_PROVIDER_SITE_OTHER): Payer: Self-pay

## 2023-07-11 DIAGNOSIS — Z8673 Personal history of transient ischemic attack (TIA), and cerebral infarction without residual deficits: Secondary | ICD-10-CM

## 2023-07-11 LAB — CUP PACEART REMOTE DEVICE CHECK
Date Time Interrogation Session: 20250324100026
Implantable Pulse Generator Implant Date: 20231116
Pulse Gen Model: 5000
Pulse Gen Serial Number: 511019978

## 2023-07-13 NOTE — Addendum Note (Signed)
 Addended by: Geralyn Flash D on: 07/13/2023 03:17 PM   Modules accepted: Orders

## 2023-07-13 NOTE — Progress Notes (Signed)
 [address] [address] [employer]

## 2023-07-18 ENCOUNTER — Other Ambulatory Visit: Payer: Self-pay | Admitting: Family Medicine

## 2023-07-21 ENCOUNTER — Encounter: Payer: Self-pay | Admitting: Cardiovascular Disease

## 2023-08-09 DIAGNOSIS — H43812 Vitreous degeneration, left eye: Secondary | ICD-10-CM | POA: Diagnosis not present

## 2023-08-09 DIAGNOSIS — H353132 Nonexudative age-related macular degeneration, bilateral, intermediate dry stage: Secondary | ICD-10-CM | POA: Diagnosis not present

## 2023-08-09 DIAGNOSIS — H35033 Hypertensive retinopathy, bilateral: Secondary | ICD-10-CM | POA: Diagnosis not present

## 2023-08-09 DIAGNOSIS — H34232 Retinal artery branch occlusion, left eye: Secondary | ICD-10-CM | POA: Diagnosis not present

## 2023-08-09 DIAGNOSIS — H34831 Tributary (branch) retinal vein occlusion, right eye, with macular edema: Secondary | ICD-10-CM | POA: Diagnosis not present

## 2023-08-15 ENCOUNTER — Ambulatory Visit (INDEPENDENT_AMBULATORY_CARE_PROVIDER_SITE_OTHER): Payer: Self-pay

## 2023-08-15 DIAGNOSIS — Z8673 Personal history of transient ischemic attack (TIA), and cerebral infarction without residual deficits: Secondary | ICD-10-CM | POA: Diagnosis not present

## 2023-08-15 LAB — CUP PACEART REMOTE DEVICE CHECK
Date Time Interrogation Session: 20250428080201
Implantable Pulse Generator Implant Date: 20231116
Pulse Gen Model: 5000
Pulse Gen Serial Number: 511019978

## 2023-08-19 ENCOUNTER — Encounter: Payer: Self-pay | Admitting: Cardiovascular Disease

## 2023-08-26 ENCOUNTER — Other Ambulatory Visit: Payer: Self-pay | Admitting: *Deleted

## 2023-08-26 MED ORDER — OMEPRAZOLE 20 MG PO CPDR: 20.0000 mg | DELAYED_RELEASE_CAPSULE | Freq: Every day | ORAL | 0 refills | Status: DC | PRN

## 2023-08-26 NOTE — Progress Notes (Signed)
 [employer]

## 2023-08-31 ENCOUNTER — Emergency Department

## 2023-08-31 ENCOUNTER — Emergency Department
Admission: EM | Admit: 2023-08-31 | Discharge: 2023-08-31 | Disposition: A | Discharge status: Home / Self Care | Attending: Emergency Medicine | Admitting: Emergency Medicine

## 2023-08-31 ENCOUNTER — Other Ambulatory Visit: Payer: Self-pay

## 2023-08-31 DIAGNOSIS — I10 Essential (primary) hypertension: Secondary | ICD-10-CM | POA: Diagnosis not present

## 2023-08-31 DIAGNOSIS — S0003XA Contusion of scalp, initial encounter: Secondary | ICD-10-CM | POA: Insufficient documentation

## 2023-08-31 DIAGNOSIS — M542 Cervicalgia: Secondary | ICD-10-CM | POA: Diagnosis not present

## 2023-08-31 DIAGNOSIS — R519 Headache, unspecified: Secondary | ICD-10-CM | POA: Diagnosis not present

## 2023-08-31 DIAGNOSIS — W19XXXA Unspecified fall, initial encounter: Secondary | ICD-10-CM | POA: Insufficient documentation

## 2023-08-31 DIAGNOSIS — S0990XA Unspecified injury of head, initial encounter: Secondary | ICD-10-CM

## 2023-08-31 DIAGNOSIS — Z7901 Long term (current) use of anticoagulants: Secondary | ICD-10-CM | POA: Diagnosis not present

## 2023-08-31 DIAGNOSIS — R9089 Other abnormal findings on diagnostic imaging of central nervous system: Secondary | ICD-10-CM | POA: Diagnosis not present

## 2023-08-31 DIAGNOSIS — G4489 Other headache syndrome: Secondary | ICD-10-CM | POA: Diagnosis not present

## 2023-08-31 DIAGNOSIS — I6523 Occlusion and stenosis of bilateral carotid arteries: Secondary | ICD-10-CM | POA: Diagnosis not present

## 2023-08-31 MED ORDER — FENTANYL CITRATE PF 50 MCG/ML IJ SOSY
50.0000 ug | PREFILLED_SYRINGE | Freq: Once | INTRAMUSCULAR | Status: AC
  Administered 2023-08-31: 50 ug via INTRAVENOUS
  Filled 2023-08-31: qty 1

## 2023-08-31 MED ORDER — ACETAMINOPHEN 500 MG PO TABS
1000.0000 mg | ORAL_TABLET | Freq: Once | ORAL | Status: AC
  Administered 2023-08-31: 1000 mg via ORAL
  Filled 2023-08-31: qty 2

## 2023-08-31 NOTE — Discharge Instructions (Signed)
 You are seen in the emergency department for head injury from a fall.  You had a CT scan of your head and your neck that did not show any broken bones or internal bleeding.  Your blood pressure was significantly elevated in the emergency department.  Continue to take your blood pressure medication as prescribed.  Follow-up with your primary care physician in the next 1 to 2 days for reevaluation.  Return to the emergency department for any worsening symptoms.

## 2023-08-31 NOTE — ED Notes (Signed)
 C collar removed

## 2023-08-31 NOTE — ED Notes (Signed)
 Discharge instructions reviewed.   Opportunity for questions and concerns provided.   Alert, oriented and ambulatory. Displays no signs of distress.

## 2023-08-31 NOTE — ED Provider Notes (Signed)
 [facility] Provider Note    Event Date/Time   First MD Initiated Contact with Patient 08/31/23 1920     (approximate)   History   Fall   HPI  Valerie Ramirez is a 89 y.o. female presents to the emergency department following a fall.  Patient states that she had a fall hitting the back of her head.  Does take Eliquis .  States that she has been having headache that has been severe since the fall.  Does have a history of hypertension and was at one point taken off of all of her antihypertensive medications but have been slowly adding them back on.  Also complaining of neck pain.  Denies any numbness or weakness in her arms.  States that she removed her cervical collar because that was hurting her neck more.  States that she will not keep it on.  She is uncertain of what caused her fall but states that she has fallen in the past.  Denies any chest pain, shortness of breath, nausea or vomiting.  Ambulatory since the fall.  Daughter at bedside states she is in her normal state of health.     Physical Exam   Triage Vital Signs: ED Triage Vitals  Encounter Vitals Group     BP 08/31/23 1822 (S) (!) 224/124     Systolic BP Percentile --      Diastolic BP Percentile --      Pulse Rate 08/31/23 1822 86     Resp 08/31/23 1822 17     Temp 08/31/23 1822 98.3 F (36.8 C)     Temp Source 08/31/23 1822 Oral     SpO2 08/31/23 1822 99 %     Weight 08/31/23 1819 180 lb (81.6 kg)     Height 08/31/23 1819 5\' 2"  (1.575 m)     Head Circumference --      Peak Flow --      Pain Score 08/31/23 1818 7     Pain Loc --      Pain Education --      Exclude from Growth Chart --     Most recent vital signs: Vitals:   08/31/23 2100 08/31/23 2250  BP: (!) 205/71 (!) 187/75  Pulse: 79 82  Resp: 19 18  Temp: 98 F (36.7 C) 98.1 F (36.7 C)  SpO2: 100% 100%    Physical Exam Constitutional:      Appearance: She is well-developed.  HENT:     Head:     Comments: Hematoma  to the occipital scalp.  Eyes:     Conjunctiva/sclera: Conjunctivae normal.  Neck:     Comments: No midline cervical spine tenderness to palpation. Cardiovascular:     Rate and Rhythm: Regular rhythm.  Pulmonary:     Effort: No respiratory distress.  Abdominal:     General: There is no distension.  Musculoskeletal:        General: Normal range of motion.     Cervical back: Normal range of motion.     Comments: Full range of motion of bilateral lower extremities.  No midline tenderness to the thoracic or lumbar spine.  Skin:    General: Skin is warm.     Capillary Refill: Capillary refill takes less than 2 seconds.  Neurological:     Mental Status: She is alert. Mental status is at baseline.     IMPRESSION / MDM / ASSESSMENT AND PLAN / ED COURSE  I reviewed the triage vital signs  and the nursing notes.  Differential diagnosis including intracranial hemorrhage, cervical spine fracture, hypertension   RADIOLOGY CT scan of the head with no signs of intracranial hemorrhage  CT scan of the head and the cervical spine read as no acute findings.  LABS (all labs ordered are listed, but only abnormal results are displayed) Labs interpreted as -    Labs Reviewed - No data to display   MDM  Given Tylenol  and pain medication.  Ambulating in the emergency department without any difficulties.  On reevaluation states that she is feeling much better no longer with a headache.  Refusing to recheck her blood pressure.  I do not see any signs or symptoms of endorgan damage.  Discussed close follow-up with primary care physician to recheck blood pressure.  Discussed return precautions.  No concern for basilar skull fracture.  Low suspicion for ligamentous injury given no midline neck pain and no numbness or weakness in her extremities.     PROCEDURES:  Critical Care performed: No  Procedures  Patient's presentation is most consistent with acute presentation with potential threat to  life or bodily function.   MEDICATIONS ORDERED IN ED: Medications  fentaNYL  (SUBLIMAZE ) injection 50 mcg (50 mcg Intravenous Given 08/31/23 2034)  acetaminophen  (TYLENOL ) tablet 1,000 mg (1,000 mg Oral Given 08/31/23 2032)    FINAL CLINICAL IMPRESSION(S) / ED DIAGNOSES   Final diagnoses:  Fall, initial encounter  Injury of head, initial encounter     Rx / DC Orders   ED Discharge Orders     None        Note:  This document was prepared using Dragon voice recognition software and may include unintentional dictation errors.   Viviano Ground, MD 09/01/23 [phone]

## 2023-08-31 NOTE — ED Triage Notes (Addendum)
 Pt arrives via [facility]. Pt was walking in her house and fell. Pt has a bump on the back of her head and complains of head/neck pain. Pt denies any LOC. Pt is on blood thinners. Pt placed in C-collar by EMS.

## 2023-08-31 NOTE — ED Notes (Signed)
 Ambulatory to restroom

## 2023-09-01 ENCOUNTER — Telehealth: Payer: Self-pay

## 2023-09-01 NOTE — Transitions of Care (Post Inpatient/ED Visit) (Signed)
 09/01/2023  Name: Valerie Ramirez MRN: [id number] DOB: 09/06/1932  Today's TOC FU Call Status: Today's TOC FU Call Status:: Successful TOC FU Call Completed TOC FU Call Complete Date: 09/01/23 Patient's Name and Date of Birth confirmed.  Transition Care Management Follow-up Telephone Call Date of Discharge: 08/31/23 Discharge Facility: [facility]) Type of Discharge: Emergency Department Reason for ED Visit: Other: (fall) How have you been since you were released from the hospital?: Better Any questions or concerns?: No  Items Reviewed: Did you receive and understand the discharge instructions provided?: Yes Medications obtained,verified, and reconciled?: Yes (Medications Reviewed) Any new allergies since your discharge?: No Dietary orders reviewed?: Yes Do you have support at home?: Yes People in Home [RPT]: child(ren), adult  Medications Reviewed Today: Medications Reviewed Today     Reviewed by Darrall Ellison, LPN (Licensed Practical Nurse) on 09/01/23 at 1555  Med List Status: <None>   Medication Order Taking? Sig Documenting Provider Last Dose Status Informant  Acetaminophen  (TYLENOL  8 HOUR PO) [id number] No Take 3 tablets by mouth daily. [provider] Taking Active   albuterol  (VENTOLIN  HFA) 108 (90 Base) MCG/ACT inhaler [id number]  Inhale 2 puffs into the lungs every 4 (four) hours as needed for wheezing or shortness of breath. Kasa, Kurian, MD  Active   Cholecalciferol (VITAMIN D-3) 25 MCG (1000 UT) CAPS [id number] No Take 1,000 Units by mouth daily. [provider] Taking Active Self  colchicine  0.6 MG tablet [id number] No TAKE 1 TABLET BY MOUTH DAILY Tower, Manley Seeds, MD Taking Active   diclofenac  Sodium (VOLTAREN  ARTHRITIS PAIN) 1 % GEL [id number] No Apply 4 g topically 4 (four) times daily.  Patient taking differently: Apply 4 g topically as needed.   Glory Larsen, MD Taking Active Self  donepezil  (ARICEPT ) 10 MG tablet  [id number] No Take 1 tablet (10 mg total) by mouth at bedtime. Lisabeth Rider, MD Taking Active   DULoxetine  (CYMBALTA ) 30 MG capsule [id number] No TAKE 1 CAPSULE BY MOUTH DAILY Tower, Manley Seeds, MD Taking Active   ELIQUIS  2.5 MG TABS tablet [id number] No TAKE 1 TABLET BY MOUTH TWICE A DAY Mealor, Augustus E, MD Taking Active   famotidine  (PEPCID ) 20 MG tablet [id number]  TAKE 1 TABLET BY MOUTH 2 TIMES A DAY Tower, Manley Seeds, MD  Active   ferrous sulfate  (FEROSUL) 325 (65 FE) MG tablet [id number] No TAKE 1 TABLET BY MOUTH DAILY  Patient taking differently: Take 325 mg by mouth every other day.   Tower, Manley Seeds, MD Taking Active   lidocaine  (LIDODERM ) 5 % [id number] No Place 1 patch onto the skin daily. Remove & Discard patch within 12 hours or as directed by MD Glory Larsen, MD Taking Active Self  losartan  (COZAAR ) 25 MG tablet [id number] No TAKE 1 TABLET BY MOUTH DAILY Tower, Manley Seeds, MD Taking Active   Magnesium Chloride (MAG-SR PLUS CALCIUM  PO) [id number] No Take by mouth. Taking 1 tab once for dizziness [provider] Taking Active Self  mupirocin  ointment (BACTROBAN ) 2 % [id number] No APPLY TWO TIMES A DAY TO AFFECTED AREA Tower, Manley Seeds, MD Taking Active   naproxen sodium (ALEVE) 220 MG tablet [id number] No Take 220 mg by mouth daily as needed. [provider] Taking Active   nitroGLYCERIN  (NITROSTAT ) 0.4 MG SL tablet [id number] No Place 1 tablet (0.4 mg total) under the tongue every 5 (five) minutes x 3 doses as needed for chest pain. [name], Andra Kava, PA-C Taking Active Self  Med Note [facility], CHRISTAN M   Wed Aug 11, 2022  9:36 AM) Not needed in last 40mo  omeprazole  (PRILOSEC) 20 MG capsule [id number]  Take 1 capsule (20 mg total) by mouth daily as needed. Tower, Manley Seeds, MD  Active   predniSONE  (DELTASONE ) 20 MG tablet [id number] No Take two tablets daily for 3 days followed by one tablet daily for 4 days  Patient not taking: Reported on 06/20/2023   Claire Crick, MD  Not Taking Active   rosuvastatin  (CRESTOR ) 20 MG tablet [id number] No TAKE 1 TABLET BY MOUTH AT BEDTIME [place], Peter M, MD Taking Active             Home Care and Equipment/Supplies: Were Home Health Services Ordered?: NA Any new equipment or medical supplies ordered?: NA  Functional Questionnaire: Do you need assistance with bathing/showering or dressing?: No Do you need assistance with meal preparation?: No Do you need assistance with eating?: No Do you have difficulty maintaining continence: No Do you need assistance with getting out of bed/getting out of a chair/moving?: No Do you have difficulty managing or taking your medications?: No  Follow up appointments reviewed: PCP Follow-up appointment confirmed?: Yes Date of PCP follow-up appointment?: 09/07/23 Follow-up Provider: [facility] Follow-up appointment confirmed?: NA Do you need transportation to your follow-up appointment?: No Do you understand care options if your condition(s) worsen?: Yes-patient verbalized understanding    SIGNATURE Darrall Ellison, LPN [facility] Nurse Health Advisor Direct Dial [phone]

## 2023-09-07 ENCOUNTER — Ambulatory Visit (INDEPENDENT_AMBULATORY_CARE_PROVIDER_SITE_OTHER): Admitting: Family Medicine

## 2023-09-07 ENCOUNTER — Telehealth: Payer: Self-pay | Admitting: *Deleted

## 2023-09-07 ENCOUNTER — Ambulatory Visit (INDEPENDENT_AMBULATORY_CARE_PROVIDER_SITE_OTHER)

## 2023-09-07 ENCOUNTER — Encounter: Payer: Self-pay | Admitting: Family Medicine

## 2023-09-07 ENCOUNTER — Ambulatory Visit: Payer: Self-pay | Admitting: Family Medicine

## 2023-09-07 VITALS — BP 156/92 | HR 85 | Temp 98.0°F | Ht 62.0 in | Wt 185.2 lb

## 2023-09-07 DIAGNOSIS — N1832 Chronic kidney disease, stage 3b: Secondary | ICD-10-CM

## 2023-09-07 DIAGNOSIS — E039 Hypothyroidism, unspecified: Secondary | ICD-10-CM | POA: Diagnosis not present

## 2023-09-07 DIAGNOSIS — D509 Iron deficiency anemia, unspecified: Secondary | ICD-10-CM

## 2023-09-07 DIAGNOSIS — F01A0 Vascular dementia, mild, without behavioral disturbance, psychotic disturbance, mood disturbance, and anxiety: Secondary | ICD-10-CM | POA: Diagnosis not present

## 2023-09-07 DIAGNOSIS — R7303 Prediabetes: Secondary | ICD-10-CM | POA: Diagnosis not present

## 2023-09-07 DIAGNOSIS — N179 Acute kidney failure, unspecified: Secondary | ICD-10-CM

## 2023-09-07 DIAGNOSIS — H9191 Unspecified hearing loss, right ear: Secondary | ICD-10-CM

## 2023-09-07 DIAGNOSIS — R519 Headache, unspecified: Secondary | ICD-10-CM | POA: Diagnosis not present

## 2023-09-07 DIAGNOSIS — E7800 Pure hypercholesterolemia, unspecified: Secondary | ICD-10-CM

## 2023-09-07 DIAGNOSIS — R2689 Other abnormalities of gait and mobility: Secondary | ICD-10-CM | POA: Insufficient documentation

## 2023-09-07 DIAGNOSIS — I10 Essential (primary) hypertension: Secondary | ICD-10-CM | POA: Diagnosis not present

## 2023-09-07 DIAGNOSIS — Z9181 History of falling: Secondary | ICD-10-CM

## 2023-09-07 DIAGNOSIS — Z8673 Personal history of transient ischemic attack (TIA), and cerebral infarction without residual deficits: Secondary | ICD-10-CM

## 2023-09-07 LAB — COMPREHENSIVE METABOLIC PANEL WITH GFR
ALT: 8 U/L (ref 0–35)
AST: 11 U/L (ref 0–37)
Albumin: 3.8 g/dL (ref 3.5–5.2)
Alkaline Phosphatase: 123 U/L — ABNORMAL HIGH (ref 39–117)
BUN: 32 mg/dL — ABNORMAL HIGH (ref 6–23)
CO2: 24 meq/L (ref 19–32)
Calcium: 8.3 mg/dL — ABNORMAL LOW (ref 8.4–10.5)
Chloride: 107 meq/L (ref 96–112)
Creatinine, Ser: 1.9 mg/dL — ABNORMAL HIGH (ref 0.40–1.20)
GFR: 23.19 mL/min — ABNORMAL LOW (ref >60.00)
Glucose, Bld: 163 mg/dL — ABNORMAL HIGH (ref 70–99)
Potassium: 4 meq/L (ref 3.5–5.1)
Sodium: 140 meq/L (ref 135–145)
Total Bilirubin: 0.3 mg/dL (ref 0.2–1.2)
Total Protein: 6 g/dL (ref 6.0–8.3)

## 2023-09-07 LAB — LIPID PANEL
Cholesterol: 178 mg/dL (ref 0–200)
HDL: 54.4 mg/dL (ref >39.00)
LDL Cholesterol: 104 mg/dL — ABNORMAL HIGH (ref 0–99)
NonHDL: 123.43
Total CHOL/HDL Ratio: 3
Triglycerides: 98 mg/dL (ref 0.0–149.0)
VLDL: 19.6 mg/dL (ref 0.0–40.0)

## 2023-09-07 LAB — CBC WITH DIFFERENTIAL/PLATELET
Basophils Absolute: 0.1 10*3/uL (ref 0.0–0.1)
Basophils Relative: 1 % (ref 0.0–3.0)
Eosinophils Absolute: 0.2 10*3/uL (ref 0.0–0.7)
Eosinophils Relative: 2.4 % (ref 0.0–5.0)
HCT: 20.7 % — CL (ref 36.0–46.0)
Hemoglobin: 6.3 g/dL — CL (ref 12.0–15.0)
Lymphocytes Relative: 21.5 % (ref 12.0–46.0)
Lymphs Abs: 1.6 10*3/uL (ref 0.7–4.0)
MCHC: 30.4 g/dL (ref 30.0–36.0)
MCV: 74 fl — ABNORMAL LOW (ref 78.0–100.0)
Monocytes Absolute: 0.7 10*3/uL (ref 0.1–1.0)
Monocytes Relative: 9.4 % (ref 3.0–12.0)
Neutro Abs: 4.9 10*3/uL (ref 1.4–7.7)
Neutrophils Relative %: 65.7 % (ref 43.0–77.0)
Platelets: 216 10*3/uL (ref 150.0–400.0)
RBC: 2.8 Mil/uL — ABNORMAL LOW (ref 3.87–5.11)
RDW: 20.5 % — ABNORMAL HIGH (ref 11.5–15.5)
WBC: 7.5 10*3/uL (ref 4.0–10.5)

## 2023-09-07 LAB — TSH: TSH: 3.14 u[IU]/mL (ref 0.35–5.50)

## 2023-09-07 LAB — FERRITIN: Ferritin: 8.4 ng/mL — ABNORMAL LOW (ref 10.0–291.0)

## 2023-09-07 LAB — HEMOGLOBIN A1C: Hgb A1c MFr Bld: 6.2 % (ref 4.6–6.5)

## 2023-09-07 LAB — IRON: Iron: 195 ug/dL — ABNORMAL HIGH (ref 42–145)

## 2023-09-07 MED ORDER — LOSARTAN POTASSIUM 50 MG PO TABS: 50.0000 mg | ORAL_TABLET | Freq: Every day | ORAL | 0 refills | Status: DC

## 2023-09-07 NOTE — Progress Notes (Signed)
 Subjective:    Patient ID: Valerie Ramirez, female    DOB: 10/28/1932, 89 y.o.   MRN: [id number]  HPI  Wt Readings from Last 3 Encounters:  09/07/23 185 lb 4 oz (84 kg)  08/31/23 180 lb (81.6 kg)  06/20/23 180 lb 12.8 oz (82 kg)   33.88 kg/m  Vitals:   09/07/23 0827  BP: (!) 156/92  Pulse: 85  Temp: 98 F (36.7 C)  SpO2: 97%      Pt presents for follow up of ER visit for fall on 5/14 Pt fell backwards hitting the back of her head   (takes eliquis )  C/o headache and neck pain  Blood pressure was very elevated   Lab Results  Component Value Date   NA 140 09/07/2023   K 4.0 09/07/2023   CO2 24 09/07/2023   GLUCOSE 163 (H) 09/07/2023   BUN 32 (H) 09/07/2023   CREATININE 1.90 (H) 09/07/2023   CALCIUM  8.3 (L) 09/07/2023   GFR 23.19 (L) 09/07/2023   EGFR 29 (L) 02/09/2021   GFRNONAA 34 (L) 09/20/2022   Lab Results  Component Value Date   ALT 8 09/07/2023   AST 11 09/07/2023   ALKPHOS 123 (H) 09/07/2023   BILITOT 0.3 09/07/2023   Lab Results  Component Value Date   CHOL 178 09/07/2023   HDL 54.40 09/07/2023   LDLCALC 104 (H) 09/07/2023   LDLDIRECT 136.0 10/31/2015   TRIG 98.0 09/07/2023   CHOLHDL 3 09/07/2023     Imaging was reassuring Was treated with tylenol  and fentanyl  IV times one  CT Head Wo Contrast Result Date: 08/31/2023 CLINICAL DATA:  Fall, head injury.  Head and neck pain. EXAM: CT HEAD WITHOUT CONTRAST CT CERVICAL SPINE WITHOUT CONTRAST TECHNIQUE: Multidetector CT imaging of the head and cervical spine was performed following the standard protocol without intravenous contrast. Multiplanar CT image reconstructions of the cervical spine were also generated. RADIATION DOSE REDUCTION: This exam was performed according to the departmental dose-optimization program which includes automated exposure control, adjustment of the mA and/or kV according to patient size and/or use of iterative reconstruction technique. COMPARISON:  09/20/2022 FINDINGS: CT  HEAD FINDINGS Brain: Normal anatomic configuration. Parenchymal volume loss is commensurate with the patient's age. Stable, advanced periventricular white matter changes are present likely reflecting the sequela of small vessel ischemia. Remote lacunar infarct within the right thalamus and cortical infarct within the right occipital cortex again noted. No abnormal intra or extra-axial mass lesion or fluid collection. No abnormal mass effect or midline shift. No evidence of acute intracranial hemorrhage or infarct. Ventricular size is normal. Cerebellum unremarkable. Vascular: No asymmetric hyperdense vasculature at the skull base. Skull: Intact Sinuses/Orbits: Paranasal sinuses are clear. Orbits are unremarkable. Other: Mastoid air cells and middle ear cavities are clear. Mild right occipital soft tissue swelling. CT CERVICAL SPINE FINDINGS Alignment: Stable 2 mm anterolisthesis C7-T1. Otherwise normal cervical lordosis. Skull base and vertebrae: Craniocervical alignment is normal. The atlantodental interval is not widened. No acute fracture of the cervical spine. Anterior cervical discectomy infusion with solid ankylosis of the vertebral bodies of C5-C7. Ankylosis of the facet joints C6-T1 bilaterally. Soft tissues and spinal canal: No prevertebral fluid or swelling. No visible canal hematoma. Central disc herniation at C3-4 abuts and mildly indents the thecal sac, similar to prior examination. There is resultant moderate central canal stenosis with an AP diameter of the spinal canal of 7 mm at this level, unchanged. Moderate bilateral carotid bifurcation calcification Disc levels: There is endplate  remodeling throughout cervical spine in keeping with changes moderate degenerative disc disease. Prevertebral soft tissues are not thickened on sagittal reformats. Multilevel uncovertebral and facet arthrosis results in moderate to severe bilateral neuroforaminal narrowing at C3-4 and C4-5 Upper chest: Negative. Other:  None IMPRESSION: 1. No acute intracranial hemorrhage or infarct. 2. No acute fracture or malalignment of the cervical spine. 3. Stable central disc herniation at C3-4 resulting in moderate central canal stenosis. 4. Multilevel degenerative disc disease and facet arthrosis resulting in moderate to severe bilateral neuroforaminal narrowing at C3-4 and C4-5. Electronically Signed   By: Worthy Heads M.D.   On: 08/31/2023 19:30   CT Cervical Spine Wo Contrast Result Date: 08/31/2023 CLINICAL DATA:  Fall, head injury.  Head and neck pain. EXAM: CT HEAD WITHOUT CONTRAST CT CERVICAL SPINE WITHOUT CONTRAST TECHNIQUE: Multidetector CT imaging of the head and cervical spine was performed following the standard protocol without intravenous contrast. Multiplanar CT image reconstructions of the cervical spine were also generated. RADIATION DOSE REDUCTION: This exam was performed according to the departmental dose-optimization program which includes automated exposure control, adjustment of the mA and/or kV according to patient size and/or use of iterative reconstruction technique. COMPARISON:  09/20/2022 FINDINGS: CT HEAD FINDINGS Brain: Normal anatomic configuration. Parenchymal volume loss is commensurate with the patient's age. Stable, advanced periventricular white matter changes are present likely reflecting the sequela of small vessel ischemia. Remote lacunar infarct within the right thalamus and cortical infarct within the right occipital cortex again noted. No abnormal intra or extra-axial mass lesion or fluid collection. No abnormal mass effect or midline shift. No evidence of acute intracranial hemorrhage or infarct. Ventricular size is normal. Cerebellum unremarkable. Vascular: No asymmetric hyperdense vasculature at the skull base. Skull: Intact Sinuses/Orbits: Paranasal sinuses are clear. Orbits are unremarkable. Other: Mastoid air cells and middle ear cavities are clear. Mild right occipital soft tissue  swelling. CT CERVICAL SPINE FINDINGS Alignment: Stable 2 mm anterolisthesis C7-T1. Otherwise normal cervical lordosis. Skull base and vertebrae: Craniocervical alignment is normal. The atlantodental interval is not widened. No acute fracture of the cervical spine. Anterior cervical discectomy infusion with solid ankylosis of the vertebral bodies of C5-C7. Ankylosis of the facet joints C6-T1 bilaterally. Soft tissues and spinal canal: No prevertebral fluid or swelling. No visible canal hematoma. Central disc herniation at C3-4 abuts and mildly indents the thecal sac, similar to prior examination. There is resultant moderate central canal stenosis with an AP diameter of the spinal canal of 7 mm at this level, unchanged. Moderate bilateral carotid bifurcation calcification Disc levels: There is endplate remodeling throughout cervical spine in keeping with changes moderate degenerative disc disease. Prevertebral soft tissues are not thickened on sagittal reformats. Multilevel uncovertebral and facet arthrosis results in moderate to severe bilateral neuroforaminal narrowing at C3-4 and C4-5 Upper chest: Negative. Other: None IMPRESSION: 1. No acute intracranial hemorrhage or infarct. 2. No acute fracture or malalignment of the cervical spine. 3. Stable central disc herniation at C3-4 resulting in moderate central canal stenosis. 4. Multilevel degenerative disc disease and facet arthrosis resulting in moderate to severe bilateral neuroforaminal narrowing at C3-4 and C4-5. Electronically Signed   By: Worthy Heads M.D.   On: 08/31/2023 19:30   CUP PACEART REMOTE DEVICE CHECK Result Date: 08/15/2023 ILR summary report received. Battery status OK. Normal device function. No new symptom, tachy, brady, or pause episodes. 2 new AF episodes, longest duration 1hr , controlled rates, burden <1%, Eliquis  per PA report.  Monthly summary reports and ROV/PRN LA,  CVRS  HTN bp is stable today  No cp or palpitations or  headaches or edema  No side effects to medicines  BP Readings from Last 3 Encounters:  09/07/23 (!) 156/92  08/31/23 (!) 187/75  06/20/23 (!) 150/86    Losartan  25 mg daily  (daughter doubled up on losartan  for a few days but not today)   History of CAD and CKD and diastolic dysfunction and a fib, also cva  Past orthostasis - was prevention on coreg  (was d/c, along with losartan )  Cardura  and amlodipine  in the past Lisinopril  prior to losartan    Has appointment with cardiology on 6/2 Pulmonary on 7/1   Headache is better  Just a knot  Neck is no longer hurting   Does not know what caused her to fall  Did not pass out   Refuses to use walker or cane     Dementia has increased Acting like a child   Lives with daughter  Refuses to place her          Patient Active Problem List   Diagnosis Date Noted   Poor balance 09/07/2023   Chronic cough 12/17/2022   Dysphagia 12/17/2022   History of atrial fibrillation 03/17/2022   Coronary artery disease of native artery of native heart with stable angina pectoris (HCC) 02/01/2022   Vascular dementia (HCC) 01/10/2022   Dyslipidemia 01/09/2022   Mild cognitive impairment 01/06/2022   Fall 06/24/2021   Syncope and collapse 06/24/2021   Acute pain of both knees 06/24/2021   Light headedness 06/24/2021   Bilateral impacted cerumen 06/24/2021   Heme positive stool 02/24/2021   History of CVA (cerebrovascular accident) 02/14/2020   Headache 02/14/2020   Dizziness 12/26/2017   Depression 10/26/2017   H/O subdural hemorrhage 09/18/2017   CAD S/P percutaneous coronary angioplasty 09/18/2017   Epistaxis    Elevated transaminase level 08/03/2017   Radial artery injury, left, sequela 06/15/2017   H/O: CVA (cerebrovascular accident) 05/26/2017   Sick sinus syndrome (HCC) 05/25/2017   Chronic kidney disease, stage 3b (HCC) 04/05/2017   Anemia 04/05/2017   Thoracic back pain 09/15/2016   History of radius fracture 06/01/2016    AKI (acute kidney injury) (HCC) 04/25/2016   Leukocytosis 04/25/2016   Status post cervical spinal fusion 08/18/2015   Acute pain of right shoulder 05/16/2015   Osteoarthritis, hip, bilateral 08/12/2014   Hearing loss in right ear 06/12/2014   Colon cancer screening 08/10/2013   Seborrheic keratoses, inflamed 08/10/2013   History of fall 04/10/2013   Encounter for Medicare annual wellness exam 08/08/2012   Gout 07/14/2012   History of endometrial cancer 05/25/2012   Degenerative joint disease of cervical spine 05/03/2011   Other screening mammogram 03/31/2011   Prediabetes 09/26/2007   Obesity (BMI 30-39.9) 07/12/2007   Essential hypertension 07/11/2007   History of cardiomyopathy 07/11/2007   Osteoarthritis 07/11/2007   MIXED INCONTINENCE URGE AND STRESS 07/11/2007   Hypothyroidism 10/05/2006   HYPERCHOLESTEROLEMIA, PURE 10/05/2006   Past Medical History:  Diagnosis Date   Arthritis    OA   CAD (coronary artery disease)    stent December 2018   Cancer [facility])    endometrial   GERD (gastroesophageal reflux disease)    Hearing loss    History of kidney stones    Hyperlipidemia    Hypertension    Hypothyroid    "not in years"   Shortness of breath dyspnea    04/25/17- not anymore   Past Surgical History:  Procedure Laterality Date  ABDOMINAL HYSTERECTOMY     ANTERIOR CERVICAL DECOMP/DISCECTOMY FUSION [name] 08/18/2015   Procedure: C5-6, C6-7 Anterior Cervical Discectomy and Fusion, Allograft, Plate;  Surgeon: Adah Acron, MD;  Location: [facility] OR;  Service: Orthopedics;  Laterality: [name];   bone spur removed Right    shoulder   BOWEL RESECTION  07/04/2012   Procedure: SMALL BOWEL RESECTION;  Surgeon: Verdia Glad, MD;  Location: [facility] ORS;  Service: Gynecology;;   BREAST BIOPSY     CHOLECYSTECTOMY     CORONARY STENT INTERVENTION [name] 04/06/2017   Procedure: CORONARY STENT INTERVENTION;  Surgeon: Wenona Hamilton, MD;  Location: [facility] INVASIVE CV LAB;  Service:  Cardiovascular;  Laterality: [name];   DILATION AND CURETTAGE OF UTERUS  1999   EYE SURGERY Bilateral    cataracts   FALSE ANEURYSM REPAIR Right 04/26/2017   Procedure: 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;  Surgeon: Arvil Lauber, MD;  Location: [facility] OR;  Service: Vascular;  Laterality: Right;   HAND SURGERY  12/2008   after hand fx/due to fall   JOINT REPLACEMENT Bilateral 07/1998   knees   KNEE ARTHROPLASTY  05/23/1998   total right   LAPAROTOMY Bilateral 07/04/2012   Procedure: EXPLORATORY LAPAROTOMY TOTAL ABDOMINAL HYSTERECTOMY BILATERAL SALPINGO-OOPHORECTOMY with small bowel resection ;  Surgeon: Verdia Glad, MD;  Location: [facility] ORS;  Service: Gynecology;  Laterality: Bilateral;   LEFT HEART CATH AND CORONARY ANGIOGRAPHY [name] 04/06/2017   Procedure: LEFT HEART CATH AND CORONARY ANGIOGRAPHY;  Surgeon: Mardell Shade, MD;  Location: [facility] INV[facility] CV [facility];  Service: Cardiovascular;  Laterality: [name];   polyp removal  1999   TEE WITHOUT CARDIOVERSION [name] 01/12/2022   Procedure: TRANSESOPHAGEAL ECHOCARDIOGRAM (TEE);  Surgeon: Devorah Fonder, MD;  Location: [facility] ORS;  Service: Cardiovascular;  Laterality: [name];   TUBAL LIGATION     Social History   Tobacco Use   Smoking status: Never   Smokeless tobacco: Never  Vaping Use   Vaping status: Never Used  Substance Use Topics   Alcohol use: Never    Alcohol/week: 0.0 standard drinks of alcohol   Drug use: Never   Family History  Problem Relation Age of Onset   Heart disease Father    Heart attack Father    Stroke Father    Heart disease Sister        CABG   Stroke Sister    Cancer Sister    Bladder Cancer Sister    Stroke Sister        "massive brain bleed"   Leukemia Brother    Lung cancer Brother    Leukemia Brother    Stomach cancer Brother    Heart disease Brother        CABG   Dementia Neg Hx    Allergies  Allergen Reactions   Allopurinol  Nausea Only   Lipitor [Atorvastatin  Calcium ] Itching and  Other (See Comments)    Leg aches and aching all over   Morphine  And Codeine Other (See Comments)    Makes the patient "loopy,"    Simvastatin Other (See Comments)    MUSCLE PAIN   Tape Itching, Rash and Other (See Comments)    "Cannot use paper tape" --- also causes blisters. Use plastic tape   Current Outpatient Medications on File Prior to Visit  Medication Sig Dispense Refill   Acetaminophen  (TYLENOL  8 HOUR PO) Take 3 tablets by mouth daily.     Cholecalciferol (VITAMIN D-3) 25 MCG (1000 UT) CAPS Take 1,000 Units  by mouth daily.     colchicine  0.6 MG tablet TAKE 1 TABLET BY MOUTH DAILY 90 tablet 1   diclofenac  Sodium (VOLTAREN  ARTHRITIS PAIN) 1 % GEL Apply 4 g topically 4 (four) times daily. (Patient taking differently: Apply 4 g topically as needed.) 100 g 11   donepezil  (ARICEPT ) 10 MG tablet Take 1 tablet (10 mg total) by mouth at bedtime. 90 tablet 1   DULoxetine  (CYMBALTA ) 30 MG capsule TAKE 1 CAPSULE BY MOUTH DAILY 90 capsule 3   ELIQUIS  2.5 MG TABS tablet TAKE 1 TABLET BY MOUTH TWICE A DAY 180 tablet 3   famotidine  (PEPCID ) 20 MG tablet TAKE 1 TABLET BY MOUTH 2 TIMES A DAY 180 tablet 0   ferrous sulfate  (FEROSUL) 325 (65 FE) MG tablet TAKE 1 TABLET BY MOUTH DAILY (Patient taking differently: Take 325 mg by mouth every other day.) 90 tablet 0   lidocaine  (LIDODERM ) 5 % Place 1 patch onto the skin daily. Remove & Discard patch within 12 hours or as directed by MD 30 patch 11   Magnesium Chloride (MAG-SR PLUS CALCIUM  PO) Take by mouth. Taking 1 tab once for dizziness     mupirocin  ointment (BACTROBAN ) 2 % APPLY TWO TIMES A DAY TO AFFECTED AREA 22 g 0   nitroGLYCERIN  (NITROSTAT ) 0.4 MG SL tablet Place 1 tablet (0.4 mg total) under the tongue every 5 (five) minutes x 3 doses as needed for chest pain. 25 tablet 12   rosuvastatin  (CRESTOR ) 20 MG tablet TAKE 1 TABLET BY MOUTH AT BEDTIME 90 tablet 3   albuterol  (VENTOLIN  HFA) 108 (90 Base) MCG/ACT inhaler Inhale 2 puffs into the lungs  every 4 (four) hours as needed for wheezing or shortness of breath. (Patient not taking: Reported on 09/07/2023) 8 g 2   No current facility-administered medications on file prior to visit.    Review of Systems  Constitutional:  Positive for fatigue. Negative for activity change, appetite change, fever and unexpected weight change.       Does nap during the day  No change in fatigue   HENT:  Negative for congestion, ear pain, rhinorrhea, sinus pressure and sore throat.   Eyes:  Negative for pain, redness and visual disturbance.  Respiratory:  Negative for cough, shortness of breath and wheezing.   Cardiovascular:  Negative for chest pain and palpitations.  Gastrointestinal:  Negative for abdominal pain, blood in stool, constipation and diarrhea.  Endocrine: Negative for polydipsia and polyuria.  Genitourinary:  Negative for dysuria, frequency and urgency.  Musculoskeletal:  Positive for arthralgias. Negative for back pain and myalgias.  Skin:  Negative for pallor and rash.  Allergic/Immunologic: Negative for environmental allergies.  Neurological:  Negative for dizziness, syncope, facial asymmetry and headaches.       Poor balance No dizziness No headache today  Hematological:  Negative for adenopathy. Does not bruise/bleed easily.  Psychiatric/Behavioral:  Negative for decreased concentration and dysphoric mood. The patient is not nervous/anxious.        Objective:   Physical Exam Constitutional:      General: She is not in acute distress.    Appearance: Normal appearance. She is well-developed. She is obese. She is not ill-appearing or diaphoretic.  HENT:     Head: Normocephalic and atraumatic.     Ears:     Comments: Hard of hearing Not wearing her hearing aides  Eyes:     Conjunctiva/sclera: Conjunctivae normal.     Pupils: Pupils are equal, round, and reactive to light.  Neck:     Thyroid : No thyromegaly.     Vascular: No carotid bruit or JVD.  Cardiovascular:      Rate and Rhythm: Normal rate and regular rhythm.     Heart sounds: Normal heart sounds.     No gallop.  Pulmonary:     Effort: Pulmonary effort is normal. No respiratory distress.     Breath sounds: Normal breath sounds. No wheezing or rales.  Abdominal:     General: There is no distension or abdominal bruit.     Palpations: Abdomen is soft.  Musculoskeletal:     Cervical back: Normal range of motion and neck supple.     Right lower leg: No edema.     Left lower leg: No edema.  Lymphadenopathy:     Cervical: No cervical adenopathy.  Skin:    General: Skin is warm and dry.     Coloration: Skin is not pale.     Findings: No rash.     Comments: Mildly sallow complexion   Neurological:     Mental Status: She is alert.     Cranial Nerves: No cranial nerve deficit.     Coordination: Coordination normal.     Deep Tendon Reflexes: Reflexes are normal and symmetric. Reflexes normal.     Comments: Poor balance  Uses help to ambulate  No focal changes   Psychiatric:        Mood and Affect: Mood normal.           Assessment & Plan:   Problem List Items Addressed This Visit       Cardiovascular and Mediastinum   Essential hypertension   Blood pressure trending up  Improved from ER (for fall)  BP: (!) 156/92  Will increase her losartan  from 25 to 50 mg daily watching closely for any orthostasis   Lab today  Follow up with cardiology as planned early June for re check  Watch blood pressure at home also  Addendum - GFR is down / may need to change to different therapy       Relevant Medications   losartan  (COZAAR ) 50 MG tablet   Other Relevant Orders   TSH (Completed)   Lipid panel (Completed)   Comprehensive metabolic panel with GFR (Completed)   CBC with Differential/Platelet (Completed)   Ambulatory referral to Nephrology     Endocrine   Hypothyroidism   In past Tsh today  No medications for this        Nervous and Auditory   Vascular dementia (HCC)    Continues donepezil   Seeing neurology Daughter cares for her at home Getting harder Pt is defiant/ refuses to use walker or wear hearing aides      Hearing loss in right ear   Refuses to wear hearing aides  This creates a safety issue        Genitourinary   Chronic kidney disease, stage 3b (HCC)   Lab today  Plan to increase losartan  to 50 mg for blood pressure  Per family=-is a good water drinker No nsaids  Addendum GFR is down to 23.19  Ref to heme (also hb 6.3) and nephrology   On losartan  for blood pressure  -will likely have to change       Relevant Orders   Comprehensive metabolic panel with GFR (Completed)   Ambulatory referral to Hematology / Oncology   Ambulatory referral to Nephrology   AKI (acute kidney injury) [facility])   Lab Results  Component Value Date  NA 140 09/07/2023   K 4.0 09/07/2023   CO2 24 09/07/2023   GLUCOSE 163 (H) 09/07/2023   BUN 32 (H) 09/07/2023   CREATININE 1.90 (H) 09/07/2023   CALCIUM  8.3 (L) 09/07/2023   GFR 23.19 (L) 09/07/2023   EGFR 29 (L) 02/09/2021   GFRNONAA 34 (L) 09/20/2022   This is down significantly  Also Hb 6.3 Referral to nephrology and hematology      Relevant Orders   Ambulatory referral to Hematology / Oncology   Ambulatory referral to Nephrology     Other   H/O: CVA (cerebrovascular accident) (Chronic)   No new cva on recent CT Struggling to manage blood pressure w/o causing hypotension       Prediabetes   A1c today  Lab Results  Component Value Date   HGBA1C 6.2 09/07/2023   HGBA1C 6.2 (H) 11/14/2021   HGBA1C 6.1 12/11/2018   disc imp of low glycemic diet and wt loss to prevent DM2  Overall stable       Relevant Orders   Hemoglobin A1c (Completed)   Poor balance   Multifactorial  Worsening    Home health ref for PT if pt is open to it  Will need to wait until anemia is addressed       HYPERCHOLESTEROLEMIA, PURE   Lab today  Disc goals for lipids and reasons to control them Rev last  labs with pt Rev low sat fat diet in detail  Rosuvastatin  20 mg daily       Relevant Medications   losartan  (COZAAR ) 50 MG tablet   Other Relevant Orders   Lipid panel (Completed)   History of fall   Another fall at home In setting of dementia  Refuses walking aides   Poor balance History of cva  Recent ER visit Reviewed hospital records, lab results and studies in detail        Headache   After fall and closed head injury  Resolved now  Continue to monitor  Discussed signs and symptoms of SDH to watch for       Anemia - Primary   Newly worse today  Lab Results  Component Value Date   WBC 7.5 09/07/2023   HGB 6.3 cL (LL) 09/07/2023   HCT 20.7 cL (LL) 09/07/2023   MCV 74.0 (L) 09/07/2023   PLT 216.0 09/07/2023   Lab Results  Component Value Date   IRON  195 (H) 09/07/2023   TIBC 345.8 06/25/2021   FERRITIN 8.4 (L) 09/07/2023    Taking oral iron   In setting of worsened ckd  Feels about the same-? Compensating  Urgent referral to hematology done       Relevant Orders   Ferritin (Completed)   Iron  (Completed)   Ambulatory referral to Hematology / Oncology

## 2023-09-07 NOTE — Assessment & Plan Note (Addendum)
 Blood pressure trending up  Improved from ER (for fall)  BP: (!) 156/92  Will increase her losartan  from 25 to 50 mg daily watching closely for any orthostasis   Lab today  Follow up with cardiology as planned early June for re check  Watch blood pressure at home also  Addendum - GFR is down / may need to change to different therapy

## 2023-09-07 NOTE — Assessment & Plan Note (Signed)
 Lab Results  Component Value Date   NA 140 09/07/2023   K 4.0 09/07/2023   CO2 24 09/07/2023   GLUCOSE 163 (H) 09/07/2023   BUN 32 (H) 09/07/2023   CREATININE 1.90 (H) 09/07/2023   CALCIUM  8.3 (L) 09/07/2023   GFR 23.19 (L) 09/07/2023   EGFR 29 (L) 02/09/2021   GFRNONAA 34 (L) 09/20/2022   This is down significantly  Also Hb 6.3 Referral to nephrology and hematology

## 2023-09-07 NOTE — Assessment & Plan Note (Addendum)
 Multifactorial  Worsening    Home health ref for PT if pt is open to it  Will need to wait until anemia is addressed

## 2023-09-07 NOTE — Assessment & Plan Note (Signed)
 Another fall at home In setting of dementia  Refuses walking aides   Poor balance History of cva  Recent ER visit Reviewed hospital records, lab results and studies in detail

## 2023-09-07 NOTE — Patient Instructions (Addendum)
 Keep watching for signs of concussion  Headache, dizziness, confusion , nausea or vomiting  Let us  know  Go to ER if anything severe   I will place a referral to home health to do some physical therapy for balance Encourage use of walker  If you don't get a call in 1-2 weeks to set that up let us  know   Go up on losartan  to 50 mg daily  If dizzy/ligh headed -stop it and let us  know   Watch the blood pressure   Follow up with cardiology as planned   Labs today

## 2023-09-07 NOTE — Telephone Encounter (Signed)
 Valerie Ramirez with [facility] called with critical lab results.  Pt's Hemoglobin is 6.3 Pt's Hematocrit is 20.7  Results given directly to PCP

## 2023-09-07 NOTE — Assessment & Plan Note (Signed)
 After fall and closed head injury  Resolved now  Continue to monitor  Discussed signs and symptoms of SDH to watch for

## 2023-09-07 NOTE — Assessment & Plan Note (Addendum)
 No new cva on recent CT Struggling to manage blood pressure w/o causing hypotension

## 2023-09-07 NOTE — Assessment & Plan Note (Signed)
 Lab today  Disc goals for lipids and reasons to control them Rev last labs with pt Rev low sat fat diet in detail  Rosuvastatin  20 mg daily

## 2023-09-07 NOTE — Assessment & Plan Note (Addendum)
 Lab today  Plan to increase losartan  to 50 mg for blood pressure  Per family=-is a good water drinker No nsaids  Addendum GFR is down to 23.19  Ref to heme (also hb 6.3) and nephrology   On losartan  for blood pressure  -will likely have to change

## 2023-09-07 NOTE — Assessment & Plan Note (Signed)
 Continues donepezil   Seeing neurology Daughter cares for her at home Getting harder Pt is defiant/ refuses to use walker or wear hearing aides

## 2023-09-07 NOTE — Assessment & Plan Note (Signed)
 Refuses to wear hearing aides  This creates a safety issue

## 2023-09-07 NOTE — Assessment & Plan Note (Signed)
 In past Tsh today  No medications for this

## 2023-09-07 NOTE — Assessment & Plan Note (Signed)
 Newly worse today  Lab Results  Component Value Date   WBC 7.5 09/07/2023   HGB 6.3 cL (LL) 09/07/2023   HCT 20.7 cL (LL) 09/07/2023   MCV 74.0 (L) 09/07/2023   PLT 216.0 09/07/2023   Lab Results  Component Value Date   IRON  195 (H) 09/07/2023   TIBC 345.8 06/25/2021   FERRITIN 8.4 (L) 09/07/2023    Taking oral iron   In setting of worsened ckd  Feels about the same-? Compensating  Urgent referral to hematology done

## 2023-09-07 NOTE — Assessment & Plan Note (Addendum)
 A1c today  Lab Results  Component Value Date   HGBA1C 6.2 09/07/2023   HGBA1C 6.2 (H) 11/14/2021   HGBA1C 6.1 12/11/2018   disc imp of low glycemic diet and wt loss to prevent DM2  Overall stable

## 2023-09-08 ENCOUNTER — Other Ambulatory Visit: Payer: Self-pay

## 2023-09-08 ENCOUNTER — Telehealth: Payer: Self-pay | Admitting: *Deleted

## 2023-09-08 ENCOUNTER — Inpatient Hospital Stay
Admission: EM | Admit: 2023-09-08 | Discharge: 2023-09-11 | DRG: 378 | Disposition: A | Discharge status: Home / Self Care | Attending: Obstetrics and Gynecology | Admitting: Obstetrics and Gynecology

## 2023-09-08 DIAGNOSIS — Z885 Allergy status to narcotic agent status: Secondary | ICD-10-CM

## 2023-09-08 DIAGNOSIS — I480 Paroxysmal atrial fibrillation: Secondary | ICD-10-CM | POA: Diagnosis present

## 2023-09-08 DIAGNOSIS — G8929 Other chronic pain: Secondary | ICD-10-CM | POA: Diagnosis not present

## 2023-09-08 DIAGNOSIS — D509 Iron deficiency anemia, unspecified: Secondary | ICD-10-CM | POA: Insufficient documentation

## 2023-09-08 DIAGNOSIS — D500 Iron deficiency anemia secondary to blood loss (chronic): Secondary | ICD-10-CM | POA: Diagnosis present

## 2023-09-08 DIAGNOSIS — E669 Obesity, unspecified: Secondary | ICD-10-CM | POA: Diagnosis not present

## 2023-09-08 DIAGNOSIS — Z801 Family history of malignant neoplasm of trachea, bronchus and lung: Secondary | ICD-10-CM

## 2023-09-08 DIAGNOSIS — Z91048 Other nonmedicinal substance allergy status: Secondary | ICD-10-CM

## 2023-09-08 DIAGNOSIS — K5520 Angiodysplasia of colon without hemorrhage: Secondary | ICD-10-CM

## 2023-09-08 DIAGNOSIS — K644 Residual hemorrhoidal skin tags: Secondary | ICD-10-CM | POA: Diagnosis present

## 2023-09-08 DIAGNOSIS — H9190 Unspecified hearing loss, unspecified ear: Secondary | ICD-10-CM | POA: Diagnosis not present

## 2023-09-08 DIAGNOSIS — I10 Essential (primary) hypertension: Secondary | ICD-10-CM | POA: Diagnosis present

## 2023-09-08 DIAGNOSIS — I25118 Atherosclerotic heart disease of native coronary artery with other forms of angina pectoris: Secondary | ICD-10-CM | POA: Diagnosis not present

## 2023-09-08 DIAGNOSIS — I495 Sick sinus syndrome: Secondary | ICD-10-CM | POA: Diagnosis not present

## 2023-09-08 DIAGNOSIS — Z823 Family history of stroke: Secondary | ICD-10-CM

## 2023-09-08 DIAGNOSIS — Z6832 Body mass index (BMI) 32.0-32.9, adult: Secondary | ICD-10-CM | POA: Diagnosis not present

## 2023-09-08 DIAGNOSIS — K449 Diaphragmatic hernia without obstruction or gangrene: Secondary | ICD-10-CM | POA: Diagnosis present

## 2023-09-08 DIAGNOSIS — Z955 Presence of coronary angioplasty implant and graft: Secondary | ICD-10-CM

## 2023-09-08 DIAGNOSIS — Z7901 Long term (current) use of anticoagulants: Secondary | ICD-10-CM | POA: Diagnosis not present

## 2023-09-08 DIAGNOSIS — K9189 Other postprocedural complications and disorders of digestive system: Secondary | ICD-10-CM | POA: Diagnosis not present

## 2023-09-08 DIAGNOSIS — Z800 Family history of malignant neoplasm of digestive organs: Secondary | ICD-10-CM

## 2023-09-08 DIAGNOSIS — Z90710 Acquired absence of both cervix and uterus: Secondary | ICD-10-CM

## 2023-09-08 DIAGNOSIS — Z79899 Other long term (current) drug therapy: Secondary | ICD-10-CM

## 2023-09-08 DIAGNOSIS — Z981 Arthrodesis status: Secondary | ICD-10-CM

## 2023-09-08 DIAGNOSIS — D125 Benign neoplasm of sigmoid colon: Secondary | ICD-10-CM | POA: Diagnosis not present

## 2023-09-08 DIAGNOSIS — K219 Gastro-esophageal reflux disease without esophagitis: Secondary | ICD-10-CM | POA: Diagnosis present

## 2023-09-08 DIAGNOSIS — N1832 Chronic kidney disease, stage 3b: Secondary | ICD-10-CM | POA: Diagnosis present

## 2023-09-08 DIAGNOSIS — E039 Hypothyroidism, unspecified: Secondary | ICD-10-CM | POA: Diagnosis present

## 2023-09-08 DIAGNOSIS — E785 Hyperlipidemia, unspecified: Secondary | ICD-10-CM | POA: Diagnosis not present

## 2023-09-08 DIAGNOSIS — K284 Chronic or unspecified gastrojejunal ulcer with hemorrhage: Secondary | ICD-10-CM | POA: Diagnosis not present

## 2023-09-08 DIAGNOSIS — D649 Anemia, unspecified: Secondary | ICD-10-CM

## 2023-09-08 DIAGNOSIS — I2510 Atherosclerotic heart disease of native coronary artery without angina pectoris: Secondary | ICD-10-CM | POA: Diagnosis not present

## 2023-09-08 DIAGNOSIS — K922 Gastrointestinal hemorrhage, unspecified: Principal | ICD-10-CM | POA: Diagnosis present

## 2023-09-08 DIAGNOSIS — Z888 Allergy status to other drugs, medicaments and biological substances status: Secondary | ICD-10-CM

## 2023-09-08 DIAGNOSIS — Z8052 Family history of malignant neoplasm of bladder: Secondary | ICD-10-CM

## 2023-09-08 DIAGNOSIS — I129 Hypertensive chronic kidney disease with stage 1 through stage 4 chronic kidney disease, or unspecified chronic kidney disease: Secondary | ICD-10-CM | POA: Diagnosis present

## 2023-09-08 DIAGNOSIS — F0150 Vascular dementia without behavioral disturbance: Secondary | ICD-10-CM | POA: Diagnosis not present

## 2023-09-08 DIAGNOSIS — K5521 Angiodysplasia of colon with hemorrhage: Secondary | ICD-10-CM | POA: Diagnosis not present

## 2023-09-08 DIAGNOSIS — Z8249 Family history of ischemic heart disease and other diseases of the circulatory system: Secondary | ICD-10-CM

## 2023-09-08 DIAGNOSIS — Z8673 Personal history of transient ischemic attack (TIA), and cerebral infarction without residual deficits: Secondary | ICD-10-CM

## 2023-09-08 DIAGNOSIS — Z806 Family history of leukemia: Secondary | ICD-10-CM

## 2023-09-08 DIAGNOSIS — K289 Gastrojejunal ulcer, unspecified as acute or chronic, without hemorrhage or perforation: Secondary | ICD-10-CM

## 2023-09-08 DIAGNOSIS — Z87442 Personal history of urinary calculi: Secondary | ICD-10-CM

## 2023-09-08 DIAGNOSIS — K635 Polyp of colon: Secondary | ICD-10-CM | POA: Diagnosis not present

## 2023-09-08 DIAGNOSIS — D6832 Hemorrhagic disorder due to extrinsic circulating anticoagulants: Secondary | ICD-10-CM | POA: Diagnosis not present

## 2023-09-08 DIAGNOSIS — K31819 Angiodysplasia of stomach and duodenum without bleeding: Secondary | ICD-10-CM | POA: Diagnosis not present

## 2023-09-08 DIAGNOSIS — M1990 Unspecified osteoarthritis, unspecified site: Secondary | ICD-10-CM | POA: Diagnosis not present

## 2023-09-08 DIAGNOSIS — I120 Hypertensive chronic kidney disease with stage 5 chronic kidney disease or end stage renal disease: Secondary | ICD-10-CM | POA: Diagnosis not present

## 2023-09-08 DIAGNOSIS — Z96659 Presence of unspecified artificial knee joint: Secondary | ICD-10-CM | POA: Diagnosis present

## 2023-09-08 LAB — PROTIME-INR
INR: 1.1 (ref 0.8–1.2)
Prothrombin Time: 14.5 s (ref 11.4–15.2)

## 2023-09-08 LAB — COMPREHENSIVE METABOLIC PANEL WITH GFR
ALT: 11 U/L (ref 0–44)
AST: 19 U/L (ref 15–41)
Albumin: 3.5 g/dL (ref 3.5–5.0)
Alkaline Phosphatase: 107 U/L (ref 38–126)
Anion gap: 5 (ref 5–15)
BUN: 27 mg/dL — ABNORMAL HIGH (ref 8–23)
CO2: 25 mmol/L (ref 22–32)
Calcium: 8.6 mg/dL — ABNORMAL LOW (ref 8.9–10.3)
Chloride: 111 mmol/L (ref 98–111)
Creatinine, Ser: 1.63 mg/dL — ABNORMAL HIGH (ref 0.44–1.00)
GFR, Estimated: 30 mL/min — ABNORMAL LOW (ref >60)
Glucose, Bld: 155 mg/dL — ABNORMAL HIGH (ref 70–99)
Potassium: 3.9 mmol/L (ref 3.5–5.1)
Sodium: 141 mmol/L (ref 135–145)
Total Bilirubin: 0.3 mg/dL (ref 0.0–1.2)
Total Protein: 6.4 g/dL — ABNORMAL LOW (ref 6.5–8.1)

## 2023-09-08 LAB — IRON AND TIBC
Iron: 17 ug/dL — ABNORMAL LOW (ref 28–170)
Saturation Ratios: 5 % — ABNORMAL LOW (ref 10.4–31.8)
TIBC: 372 ug/dL (ref 250–450)
UIBC: 355 ug/dL

## 2023-09-08 LAB — HEMOGLOBIN
Hemoglobin: 6.9 g/dL — ABNORMAL LOW (ref 12.0–15.0)
Hemoglobin: 6.8 g/dL — ABNORMAL LOW (ref 12.0–15.0)

## 2023-09-08 LAB — CBC
HCT: 21.8 % — ABNORMAL LOW (ref 36.0–46.0)
Hemoglobin: 6 g/dL — ABNORMAL LOW (ref 12.0–15.0)
MCH: 22.6 pg — ABNORMAL LOW (ref 26.0–34.0)
MCHC: 27.5 g/dL — ABNORMAL LOW (ref 30.0–36.0)
MCV: 82.3 fL (ref 80.0–100.0)
Platelets: 198 10*3/uL (ref 150–400)
RBC: 2.65 MIL/uL — ABNORMAL LOW (ref 3.87–5.11)
RDW: 18.6 % — ABNORMAL HIGH (ref 11.5–15.5)
WBC: 8.1 10*3/uL (ref 4.0–10.5)
nRBC: 0 % (ref 0.0–0.2)

## 2023-09-08 LAB — FERRITIN: Ferritin: 17 ng/mL (ref 11–307)

## 2023-09-08 LAB — PREPARE RBC (CROSSMATCH)

## 2023-09-08 LAB — GLUCOSE, CAPILLARY: Glucose-Capillary: 111 mg/dL — ABNORMAL HIGH (ref 70–99)

## 2023-09-08 MED ORDER — HYDRALAZINE HCL 20 MG/ML IJ SOLN
10.0000 mg | INTRAMUSCULAR | Status: DC | PRN
  Administered 2023-09-09: 10 mg via INTRAVENOUS
  Filled 2023-09-08: qty 1

## 2023-09-08 MED ORDER — ONDANSETRON HCL 4 MG PO TABS: 4.0000 mg | ORAL_TABLET | Freq: Four times a day (QID) | ORAL | Status: DC | PRN

## 2023-09-08 MED ORDER — ACETAMINOPHEN 325 MG PO TABS: 650.0000 mg | ORAL_TABLET | Freq: Four times a day (QID) | ORAL | Status: DC | PRN

## 2023-09-08 MED ORDER — LOSARTAN POTASSIUM 50 MG PO TABS
50.0000 mg | ORAL_TABLET | Freq: Every day | ORAL | Status: DC
  Administered 2023-09-09 – 2023-09-11 (×3): 50 mg via ORAL
  Filled 2023-09-08 (×3): qty 1

## 2023-09-08 MED ORDER — ALBUTEROL SULFATE (2.5 MG/3ML) 0.083% IN NEBU: 2.5000 mg | INHALATION_SOLUTION | RESPIRATORY_TRACT | Status: DC | PRN

## 2023-09-08 MED ORDER — PANTOPRAZOLE SODIUM 40 MG IV SOLR: 40.0000 mg | Freq: Three times a day (TID) | INTRAVENOUS | Status: DC

## 2023-09-08 MED ORDER — DONEPEZIL HCL 5 MG PO TABS
10.0000 mg | ORAL_TABLET | Freq: Every day | ORAL | Status: DC
  Administered 2023-09-09 – 2023-09-10 (×2): 10 mg via ORAL
  Filled 2023-09-08 (×4): qty 2

## 2023-09-08 MED ORDER — SODIUM CHLORIDE 0.9 % IV SOLN: INTRAVENOUS | Status: DC

## 2023-09-08 MED ORDER — PANTOPRAZOLE SODIUM 40 MG IV SOLR: 40.0000 mg | Freq: Two times a day (BID) | INTRAVENOUS | Status: DC

## 2023-09-08 MED ORDER — SODIUM CHLORIDE 0.9% IV SOLUTION: Freq: Once | INTRAVENOUS | Status: AC

## 2023-09-08 MED ORDER — ACETAMINOPHEN 650 MG RE SUPP
650.0000 mg | Freq: Four times a day (QID) | RECTAL | Status: DC | PRN
  Administered 2023-09-10: 650 mg via RECTAL
  Filled 2023-09-08: qty 1

## 2023-09-08 MED ORDER — ROSUVASTATIN CALCIUM 10 MG PO TABS
20.0000 mg | ORAL_TABLET | Freq: Every day | ORAL | Status: DC
  Administered 2023-09-10: 20 mg via ORAL
  Filled 2023-09-08 (×2): qty 2

## 2023-09-08 MED ORDER — PANTOPRAZOLE SODIUM 40 MG IV SOLR
40.0000 mg | Freq: Two times a day (BID) | INTRAVENOUS | Status: DC
  Filled 2023-09-08 (×2): qty 10

## 2023-09-08 MED ORDER — PANTOPRAZOLE SODIUM 40 MG IV SOLR
40.0000 mg | INTRAVENOUS | Status: AC
  Administered 2023-09-08 (×2): 40 mg via INTRAVENOUS
  Filled 2023-09-08: qty 10

## 2023-09-08 MED ORDER — ONDANSETRON HCL 4 MG/2ML IJ SOLN: 4.0000 mg | Freq: Four times a day (QID) | INTRAMUSCULAR | Status: DC | PRN

## 2023-09-08 MED ORDER — SODIUM CHLORIDE 0.9 % IV SOLN: 10.0000 mL/h | Freq: Once | INTRAVENOUS | Status: DC

## 2023-09-08 MED ORDER — DULOXETINE HCL 30 MG PO CPEP
30.0000 mg | ORAL_CAPSULE | Freq: Every day | ORAL | Status: DC
  Administered 2023-09-09 – 2023-09-11 (×3): 30 mg via ORAL
  Filled 2023-09-08 (×3): qty 1

## 2023-09-08 MED ORDER — ACETAMINOPHEN 500 MG PO TABS
500.0000 mg | ORAL_TABLET | Freq: Every day | ORAL | Status: DC
  Administered 2023-09-08 – 2023-09-11 (×3): 500 mg via ORAL
  Filled 2023-09-08 (×4): qty 1

## 2023-09-08 NOTE — ED Triage Notes (Addendum)
 Pt to Valerie Ramirez via POV from PCP. Pt reports called to come in due to abnormal lab drawn yesterday. HGB 6.3. Pt denies dizziness ort bleeding. Pt had a fall on 5/14 and hit her head. Pt was seen here for fall. Pt is on Eliquis   Pt is [facility]

## 2023-09-08 NOTE — ED Notes (Signed)
Pt ambulated to restroom with one person assist.

## 2023-09-08 NOTE — H&P (Addendum)
 History and Physical    [name] MWU:[id number] DOB: 1933-01-10 DOA: 09/08/2023  PCP: Valerie Curt, Ramirez  Patient coming from: home  I have personally briefly reviewed patient's old medical records in [facility]  Chief Complaint: abn lab : hgb of 6 on out patient lab  HPI: Valerie Ramirez is a 89 y.o. female with medical history significant of  Atrial fibrillation on Eliquis , hx of recurrent CVA , IDA , GERD, hypothyroidism , vascular dementia, CAD s/p stent 2018, HTN, who presents to ED due to abnormal outpatient labs noting hgb of 6.3.  Due to this patient was referred to ED for further evaluation. Patient notes no sob, no n/v/d no dark stools, no chest pain or fatigue. Per family she has had not complaints but has been more fatigued of late.   ED Course:   IN ED patient noted to have stable vitals. Repeat hgb 6 with plt 198. Rectal exam per noted dark brown stool  that where fob + Patient transfused 1 units in ED.   Vitals  Afeb, bp 156/92-193/80, hr 85, sat 97%  Wbc 7.5, hgb 6.0,  plt 198 inr 1.1 Ferritin 17  iron  17 iron  sat 5  Tx  Protonix  40 mg iv, PRBC x 1   Review of Systems: As per HPI otherwise 10 point review of systems negative.   Past Medical History:  Diagnosis Date   Arthritis    OA   CAD (coronary artery disease)    stent December 2018   [facility])    endometrial   GERD (gastroesophageal reflux disease)    Hearing loss    History of kidney stones    Hyperlipidemia    Hypertension    Hypothyroid    "not in years"   Shortness of breath dyspnea    04/25/17- not anymore    Past Surgical History:  Procedure Laterality Date   ABDOMINAL HYSTERECTOMY     ANTERIOR CERVICAL DECOMP/DISCECTOMY FUSION [name] 08/18/2015   Procedure: C5-6, C6-7 Anterior Cervical Discectomy and Fusion, Allograft, Plate;  Surgeon: Valerie Acron, Ramirez;  Location: [facility] OR;  Service: Orthopedics;  Laterality: [name];   bone spur removed Right    shoulder   BOWEL RESECTION   07/04/2012   Procedure: SMALL BOWEL RESECTION;  Surgeon: Valerie Glad, Ramirez;  Location: [facility] ORS;  Service: Gynecology;;   BREAST BIOPSY     CHOLECYSTECTOMY     CORONARY STENT INTERVENTION [name] 04/06/2017   Procedure: CORONARY STENT INTERVENTION;  Surgeon: Wenona Hamilton, Ramirez;  Location: [facility] INVASIVE CV LAB;  Service: Cardiovascular;  Laterality: [name];   DILATION AND CURETTAGE OF UTERUS  1999   EYE SURGERY Bilateral    cataracts   FALSE ANEURYSM REPAIR Right 04/26/2017   Procedure: 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;  Surgeon: Valerie Lauber, Ramirez;  Location: [facility] OR;  Service: Vascular;  Laterality: Right;   HAND SURGERY  12/2008   after hand fx/due to fall   JOINT REPLACEMENT Bilateral 07/1998   knees   KNEE ARTHROPLASTY  05/23/1998   total right   LAPAROTOMY Bilateral 07/04/2012   Procedure: EXPLORATORY LAPAROTOMY TOTAL ABDOMINAL HYSTERECTOMY BILATERAL SALPINGO-OOPHORECTOMY with small bowel resection ;  Surgeon: Valerie Glad, Ramirez;  Location: [facility] ORS;  Service: Gynecology;  Laterality: Bilateral;   LEFT HEART CATH AND CORONARY ANGIOGRAPHY [name] 04/06/2017   Procedure: LEFT HEART CATH AND CORONARY ANGIOGRAPHY;  Surgeon: Valerie Shade, Ramirez;  Location: [facility] INVASIVE CV LAB;  Service: Cardiovascular;  Laterality: [name];   polyp removal  1999   TEE WITHOUT CARDIOVERSION [name] 01/12/2022   Procedure: TRANSESOPHAGEAL ECHOCARDIOGRAM (TEE);  Surgeon: Valerie Ramirez;  Location: [facility] ORS;  Service: Cardiovascular;  Laterality: [name];   TUBAL LIGATION       reports that she has never smoked. She has never used smokeless tobacco. She reports that she does not drink alcohol and does not use drugs.  Allergies  Allergen Reactions   Allopurinol  Nausea Only   Lipitor [Atorvastatin  Calcium ] Itching and Other (See Comments)    Leg aches and aching all over   Morphine  And Codeine Other (See Comments)    Makes the patient "loopy,"    Simvastatin Other (See Comments)    MUSCLE PAIN    Tape Itching, Rash and Other (See Comments)    "Cannot use paper tape" --- also causes blisters. Use plastic tape    Family History  Problem Relation Age of Onset   Heart disease Father    Heart attack Father    Stroke Father    Heart disease Sister        CABG   Stroke Sister    Cancer Sister    Bladder Cancer Sister    Stroke Sister        "massive brain bleed"   Leukemia Brother    Lung cancer Brother    Leukemia Brother    Stomach cancer Brother    Heart disease Brother        CABG   Dementia Neg Hx     Prior to Admission medications   Medication Sig Start Date End Date Taking? Authorizing Provider  Acetaminophen  (TYLENOL  8 HOUR PO) Take 3 tablets by mouth daily.    Provider, Historical, Ramirez  albuterol  (VENTOLIN  HFA) 108 (90 Base) MCG/ACT inhaler Inhale 2 puffs into the lungs every 4 (four) hours as needed for wheezing or shortness of breath. Patient not taking: Reported on 09/07/2023 06/20/23   Kasa, Kurian, Ramirez  Cholecalciferol (VITAMIN D-3) 25 MCG (1000 UT) CAPS Take 1,000 Units by mouth daily.    Provider, Historical, Ramirez  colchicine  0.6 MG tablet TAKE 1 TABLET BY MOUTH DAILY 05/04/23   Tower, Manley Seeds, Ramirez  diclofenac  Sodium (VOLTAREN  ARTHRITIS PAIN) 1 % GEL Apply 4 g topically 4 (four) times daily. Patient taking differently: Apply 4 g topically as needed. 01/06/22   Glory Larsen, Ramirez  donepezil  (ARICEPT ) 10 MG tablet Take 1 tablet (10 mg total) by mouth at bedtime. 03/21/23   Lisabeth Rider, Ramirez  DULoxetine  (CYMBALTA ) 30 MG capsule TAKE 1 CAPSULE BY MOUTH DAILY 03/09/23   Tower, Manley Seeds, Ramirez  ELIQUIS  2.5 MG TABS tablet TAKE 1 TABLET BY MOUTH TWICE A DAY 04/04/23   Mealor, Augustus E, Ramirez  famotidine  (PEPCID ) 20 MG tablet TAKE 1 TABLET BY MOUTH 2 TIMES A DAY 07/18/23   Tower, Manley Seeds, Ramirez  ferrous sulfate  (FEROSUL) 325 (65 FE) MG tablet TAKE 1 TABLET BY MOUTH DAILY Patient taking differently: Take 325 mg by mouth every other day. 01/10/23   Tower, Manley Seeds, Ramirez  lidocaine   (LIDODERM ) 5 % Place 1 patch onto the skin daily. Remove & Discard patch within 12 hours or as directed by Ramirez 01/06/22   Glory Larsen, Ramirez  losartan  (COZAAR ) 50 MG tablet Take 1 tablet (50 mg total) by mouth daily. 09/07/23   Tower, Manley Seeds, Ramirez  Magnesium Chloride (MAG-SR PLUS CALCIUM  PO) Take by mouth. Taking 1 tab once for dizziness  Provider, Historical, Ramirez  mupirocin  ointment (BACTROBAN ) 2 % APPLY TWO TIMES A DAY TO AFFECTED AREA 10/07/22   Tower, Manley Seeds, Ramirez  nitroGLYCERIN  (NITROSTAT ) 0.4 MG SL tablet Place 1 tablet (0.4 mg total) under the tongue every 5 (five) minutes x 3 doses as needed for chest pain. 04/09/17   [name], Andra Kava, PA-C  rosuvastatin  (CRESTOR ) 20 MG tablet TAKE 1 TABLET BY MOUTH AT BEDTIME 03/04/23   [place], Peter M, Ramirez    Physical Exam: Vitals:   09/08/23 1425  BP: (!) 193/80  Pulse: 80  Resp: 20  Temp: 98.1 F (36.7 C)  TempSrc: Oral  SpO2: 97%  Weight: 84 kg  Height: 5\' 2"  (1.575 m)    Constitutional: NAD, calm, comfortable Vitals:   09/08/23 1425  BP: (!) 193/80  Pulse: 80  Resp: 20  Temp: 98.1 F (36.7 C)  TempSrc: Oral  SpO2: 97%  Weight: 84 kg  Height: 5\' 2"  (1.575 m)   Eyes: PERRL, lids and conjunctivae normal ENMT: Mucous membranes are moist. Posterior pharynx clear of any exudate or lesions.Normal dentition.  Neck: normal, supple, no masses, no thyromegaly Respiratory: clear to auscultation bilaterally, no wheezing, no crackles. Normal respiratory effort. No accessory muscle use.  Cardiovascular: Regular rate and rhythm, no murmurs / rubs / gallops. No extremity edema. 2+ pedal pulses. No carotid bruits.  Abdomen: no tenderness, no masses palpated. No hepatosplenomegaly. Bowel sounds positive.  Musculoskeletal: no clubbing / cyanosis. No joint deformity upper and lower extremities. Good ROM, no contractures. Normal muscle tone.  Skin: no rashes, lesions, ulcers. No induration Neurologic: CN 2-12 grossly intact. Sensation intact, DTR  normal. Strength 5/5 in all 4.  Psychiatric: Normal judgment and insight. Alert and oriented x 3. Normal mood.    Labs on Admission: I have personally reviewed following labs and imaging studies  CBC: Recent Labs  Lab 09/07/23 0907 09/08/23 1432  WBC 7.5 8.1  NEUTROABS 4.9  --   HGB 6.3 cL* 6.0*  HCT 20.7 cL* 21.8*  MCV 74.0* 82.3  PLT 216.0 198   Basic Metabolic Panel: Recent Labs  Lab 09/07/23 0907 09/08/23 1432  NA 140 141  K 4.0 3.9  CL 107 111  CO2 24 25  GLUCOSE 163* 155*  BUN 32* 27*  CREATININE 1.90* 1.63*  CALCIUM  8.3* 8.6*   GFR: Estimated Creatinine Clearance: 23.5 mL/min (A) (by C-G formula based on SCr of 1.63 mg/dL (H)). Liver Function Tests: Recent Labs  Lab 09/07/23 0907 09/08/23 1432  AST 11 19  ALT 8 11  ALKPHOS 123* 107  BILITOT 0.3 0.3  PROT 6.0 6.4*  ALBUMIN 3.8 3.5   No results for input(s): "LIPASE", "AMYLASE" in the last 168 hours. No results for input(s): "AMMONIA" in the last 168 hours. Coagulation Profile: Recent Labs  Lab 09/08/23 1432  INR 1.1   Cardiac Enzymes: No results for input(s): "CKTOTAL", "CKMB", "CKMBINDEX", "TROPONINI" in the last 168 hours. BNP (last 3 results) No results for input(s): "PROBNP" in the last 8760 hours. HbA1C: Recent Labs    09/07/23 0907  HGBA1C 6.2   CBG: No results for input(s): "GLUCAP" in the last 168 hours. Lipid Profile: Recent Labs    09/07/23 0907  CHOL 178  HDL 54.40  LDLCALC 104*  TRIG 98.0  CHOLHDL 3   Thyroid  Function Tests: Recent Labs    09/07/23 0907  TSH 3.14   Anemia Panel: Recent Labs    09/07/23 1548  FERRITIN 8.4*  IRON  195*   Urine  analysis:    Component Value Date/Time   COLORURINE YELLOW (A) 09/20/2022 1920   APPEARANCEUR HAZY (A) 09/20/2022 1920   LABSPEC 1.023 09/20/2022 1920   PHURINE 5.0 09/20/2022 1920   GLUCOSEU NEGATIVE 09/20/2022 1920   HGBUR NEGATIVE 09/20/2022 1920   BILIRUBINUR NEGATIVE 09/20/2022 1920   BILIRUBINUR Negative  03/25/2020 1231   KETONESUR NEGATIVE 09/20/2022 1920   PROTEINUR >=300 (A) 09/20/2022 1920   UROBILINOGEN 0.2 03/25/2020 1231   UROBILINOGEN 1.0 06/20/2014 1030   NITRITE NEGATIVE 09/20/2022 1920   LEUKOCYTESUR NEGATIVE 09/20/2022 1920    Radiological Exams on Admission: No results found.  EKG: Independently reviewed.pending  Assessment/Plan Upper GI bleed  Symptomatic anemia of blood loss - admit to progressive care  -s/p 1 unit prbc in ED -cycle h/h transfuse if < 8  - continue with protonix  iv bid  - clear now , npo midnight - gi to see in am , for possible scope  - holding eliquis    Atrial fibrillation  - not on rate control agent  - hold eliquis    Hx of CVA  - on secondary ppx with Eliquis  ( holding currently)   Hypertension , uncontrolled -resume home regimen  -prn labetolol  GERD - ppi  Hypothyroidism -resume synthroid  CAD s/p stent -no active issue  - continue statin     Vascular dementia -no active issues  DVT prophylaxis: scd Code Status: full/ as discussed per patient/daugther wishes in event of cardiac arrest  Family Communication: daughter at bedside Disposition Plan: patient  expected to be admitted greater than 2 midnights  Consults called: DR Florencio Hunting Admission status: progressive care   Sabas Cradle Ramirez [facility]   If 7PM-7AM, please contact night-coverage [email or web address] Password [facility]  09/08/2023, 5:29 PM

## 2023-09-08 NOTE — Progress Notes (Signed)
In ER now

## 2023-09-08 NOTE — Telephone Encounter (Signed)
In ER now Aware, will watch for correspondence  

## 2023-09-08 NOTE — Telephone Encounter (Signed)
 Please let pt/ caregiver know  Hematology recommends ER visit for anemia before they can see her because hb is too low  Please call triage and let them know she is coming

## 2023-09-08 NOTE — ED Notes (Signed)
 Admitting MD notified of BP

## 2023-09-08 NOTE — Telephone Encounter (Signed)
 Copied from CRM [id number]. Topic: General - Other >> Sep 08, 2023  9:09 AM Howard Macho wrote: Reason for CRM: sharon from [facility] called stating they received a referral for the patient, but the patient need to go to the emergency room or be seen immediately CB [phone]

## 2023-09-08 NOTE — Telephone Encounter (Signed)
 Daughter notified of Dr. Belva Boyden comments and she will take pt to ER now

## 2023-09-08 NOTE — ED Provider Notes (Signed)
 [facility] Provider Note    Event Date/Time   First MD Initiated Contact with Patient 09/08/23 1546     (approximate)   History   Anemia   HPI  Valerie Ramirez is a 89 y.o. female who presents to the emerged  department today accompanied by family because of concerns for abnormal blood work.  Patient had blood work performed yesterday which showed anemia.  Family states that she has been weaker and more tired over the past month than normal. The patient has had anemia and has required a blood transfusion once in the past.      Physical Exam   Triage Vital Signs: ED Triage Vitals [09/08/23 1425]  Encounter Vitals Group     BP (!) 193/80     Systolic BP Percentile      Diastolic BP Percentile      Pulse Rate 80     Resp 20     Temp 98.1 F (36.7 C)     Temp Source Oral     SpO2 97 %     Weight 185 lb 4 oz (84 kg)     Height 5\' 2"  (1.575 m)     Head Circumference      Peak Flow      Pain Score 0     Pain Loc      Pain Education      Exclude from Growth Chart     Most recent vital signs: Vitals:   09/08/23 1425  BP: (!) 193/80  Pulse: 80  Resp: 20  Temp: 98.1 F (36.7 C)  SpO2: 97%   General: Awake, alert, oriented. CV:  Good peripheral perfusion. Regular rate and rhythm. Resp:  Normal effort. Lungs clear. Abd:  No distention.    ED Results / Procedures / Treatments   Labs (all labs ordered are listed, but only abnormal results are displayed) Labs Reviewed  COMPREHENSIVE METABOLIC PANEL WITH GFR - Abnormal; Notable for the following components:      Result Value   Glucose, Bld 155 (*)    BUN 27 (*)    Creatinine, Ser 1.63 (*)    Calcium  8.6 (*)    Total Protein 6.4 (*)    GFR, Estimated 30 (*)    All other components within normal limits  CBC - Abnormal; Notable for the following components:   RBC 2.65 (*)    Hemoglobin 6.0 (*)    HCT 21.8 (*)    MCH 22.6 (*)    MCHC 27.5 (*)    RDW 18.6 (*)    All other  components within normal limits  PROTIME-INR  POC OCCULT BLOOD, ED  TYPE AND SCREEN     EKG  None   RADIOLOGY None   PROCEDURES:  Critical Care performed: Yes  CRITICAL CARE Performed by: Marylynn Soho   Total critical care time: 30 minutes  Critical care time was exclusive of separately billable procedures and treating other patients.  Critical care was necessary to treat or prevent imminent or life-threatening deterioration.  Critical care was time spent personally by me on the following activities: development of treatment plan with patient and/or surrogate as well as nursing, discussions with consultants, evaluation of patient's response to treatment, examination of patient, obtaining history from patient or surrogate, ordering and performing treatments and interventions, ordering and review of laboratory studies, ordering and review of radiographic studies, pulse oximetry and re-evaluation of patient's condition.   Procedures    MEDICATIONS ORDERED  IN ED: Medications - No data to display   IMPRESSION / MDM / ASSESSMENT AND PLAN / ED COURSE  I reviewed the triage vital signs and the nursing notes.                              Differential diagnosis includes, but is not limited to, iron  deficiency, acute blood loss  Patient's presentation is most consistent with acute presentation with potential threat to life or bodily function.  The patient is on the cardiac monitor to evaluate for evidence of arrhythmia and/or significant heart rate changes.  Patient presented to the emergency department today because of concerns for outpatient blood work which shows anemia.  Patient herself has no complaints although family states she has seemed more fatigued than normal over the past month.  Guaiac was positive with dark stool on the glove.  Blood work here does show hemoglobin of 6.0.  Did discuss and consent patient and family for blood transfusion.  Will start IV  Protonix .  Discussed with Dr. Andy Bannister with the hospitalist service will evaluate for admission.      FINAL CLINICAL IMPRESSION(S) / ED DIAGNOSES   Final diagnoses:  Gastrointestinal hemorrhage, unspecified gastrointestinal hemorrhage type  Anemia, unspecified type        Rx / DC Orders   ED Discharge Orders     None        Note:  This document was prepared using Dragon voice recognition software and may include unintentional dictation errors.    Marylynn Soho, MD 09/08/23 [phone]

## 2023-09-09 ENCOUNTER — Encounter: Payer: Self-pay | Admitting: Internal Medicine

## 2023-09-09 ENCOUNTER — Encounter
Admission: EM | Disposition: A | Discharge status: Home / Self Care | Payer: Self-pay | Source: Home / Self Care | Attending: Obstetrics and Gynecology

## 2023-09-09 ENCOUNTER — Inpatient Hospital Stay

## 2023-09-09 DIAGNOSIS — K449 Diaphragmatic hernia without obstruction or gangrene: Secondary | ICD-10-CM

## 2023-09-09 DIAGNOSIS — K922 Gastrointestinal hemorrhage, unspecified: Secondary | ICD-10-CM | POA: Diagnosis not present

## 2023-09-09 DIAGNOSIS — D509 Iron deficiency anemia, unspecified: Secondary | ICD-10-CM | POA: Insufficient documentation

## 2023-09-09 DIAGNOSIS — I480 Paroxysmal atrial fibrillation: Secondary | ICD-10-CM | POA: Insufficient documentation

## 2023-09-09 HISTORY — PX: ESOPHAGOGASTRODUODENOSCOPY: SHX5428

## 2023-09-09 LAB — C DIFFICILE QUICK SCREEN W PCR REFLEX
C Diff antigen: NEGATIVE
C Diff interpretation: NOT DETECTED
C Diff toxin: NEGATIVE

## 2023-09-09 LAB — GASTROINTESTINAL PANEL BY PCR, STOOL (REPLACES STOOL CULTURE)

## 2023-09-09 LAB — CBC
HCT: 29.3 % — ABNORMAL LOW (ref 36.0–46.0)
Hemoglobin: 8.6 g/dL — ABNORMAL LOW (ref 12.0–15.0)
MCH: 24.2 pg — ABNORMAL LOW (ref 26.0–34.0)
MCHC: 29.4 g/dL — ABNORMAL LOW (ref 30.0–36.0)
MCV: 82.5 fL (ref 80.0–100.0)
Platelets: 207 10*3/uL (ref 150–400)
RBC: 3.55 MIL/uL — ABNORMAL LOW (ref 3.87–5.11)
RDW: 17.2 % — ABNORMAL HIGH (ref 11.5–15.5)
WBC: 10.3 10*3/uL (ref 4.0–10.5)
nRBC: 0 % (ref 0.0–0.2)

## 2023-09-09 LAB — COMPREHENSIVE METABOLIC PANEL WITH GFR
ALT: 10 U/L (ref 0–44)
AST: 16 U/L (ref 15–41)
Albumin: 3.4 g/dL — ABNORMAL LOW (ref 3.5–5.0)
Alkaline Phosphatase: 117 U/L (ref 38–126)
Anion gap: 10 (ref 5–15)
BUN: 22 mg/dL (ref 8–23)
CO2: 21 mmol/L — ABNORMAL LOW (ref 22–32)
Calcium: 8.3 mg/dL — ABNORMAL LOW (ref 8.9–10.3)
Chloride: 103 mmol/L (ref 98–111)
Creatinine, Ser: 1.52 mg/dL — ABNORMAL HIGH (ref 0.44–1.00)
GFR, Estimated: 33 mL/min — ABNORMAL LOW (ref >60)
Glucose, Bld: 132 mg/dL — ABNORMAL HIGH (ref 70–99)
Potassium: 3.3 mmol/L — ABNORMAL LOW (ref 3.5–5.1)
Sodium: 134 mmol/L — ABNORMAL LOW (ref 135–145)
Total Bilirubin: 1 mg/dL (ref 0.0–1.2)
Total Protein: 6.4 g/dL — ABNORMAL LOW (ref 6.5–8.1)

## 2023-09-09 LAB — PROTIME-INR
INR: 1.1 (ref 0.8–1.2)
Prothrombin Time: 14.6 s (ref 11.4–15.2)

## 2023-09-09 LAB — HEMOGLOBIN: Hemoglobin: 6.9 g/dL — ABNORMAL LOW (ref 12.0–15.0)

## 2023-09-09 LAB — TSH: TSH: 3.075 u[IU]/mL (ref 0.350–4.500)

## 2023-09-09 LAB — PREPARE RBC (CROSSMATCH)

## 2023-09-09 SURGERY — EGD (ESOPHAGOGASTRODUODENOSCOPY)
Anesthesia: General

## 2023-09-09 MED ORDER — HYDROCHLOROTHIAZIDE 12.5 MG PO TABS
12.5000 mg | ORAL_TABLET | Freq: Every day | ORAL | Status: DC
  Administered 2023-09-09 – 2023-09-11 (×3): 12.5 mg via ORAL
  Filled 2023-09-09 (×3): qty 1

## 2023-09-09 MED ORDER — AMLODIPINE BESYLATE 10 MG PO TABS
10.0000 mg | ORAL_TABLET | Freq: Every day | ORAL | Status: DC
  Administered 2023-09-09 – 2023-09-11 (×3): 10 mg via ORAL
  Filled 2023-09-09 (×3): qty 1

## 2023-09-09 MED ORDER — PROPOFOL 10 MG/ML IV BOLUS
INTRAVENOUS | Status: DC | PRN
  Administered 2023-09-09: 70 mg via INTRAVENOUS
  Administered 2023-09-09 (×2): 10 mg via INTRAVENOUS

## 2023-09-09 MED ORDER — POLYETHYLENE GLYCOL 3350 17 GM/SCOOP PO POWD
238.0000 g | Freq: Once | ORAL | Status: AC
  Administered 2023-09-09: 238 g via ORAL
  Filled 2023-09-09: qty 238

## 2023-09-09 MED ORDER — LIDOCAINE HCL (PF) 2 % IJ SOLN
INTRAMUSCULAR | Status: DC | PRN
  Administered 2023-09-09: 100 mg via INTRADERMAL

## 2023-09-09 MED ORDER — LIDOCAINE HCL (PF) 2 % IJ SOLN
INTRAMUSCULAR | Status: AC
  Filled 2023-09-09: qty 5

## 2023-09-09 MED ORDER — SODIUM CHLORIDE 0.9% IV SOLUTION: Freq: Once | INTRAVENOUS | Status: AC

## 2023-09-09 MED ORDER — PROPOFOL 1000 MG/100ML IV EMUL
INTRAVENOUS | Status: AC
Start: 2023-09-09 — End: ?
  Filled 2023-09-09: qty 100

## 2023-09-09 NOTE — Transfer of Care (Signed)
 Immediate Anesthesia Transfer of Care Note  Patient: Valerie Ramirez  Procedure(s) Performed: EGD (ESOPHAGOGASTRODUODENOSCOPY)  Patient Location: Endoscopy Unit  Anesthesia Type:General  Level of Consciousness: drowsy  Airway & Oxygen Therapy: Patient Spontanous Breathing  Post-op Assessment: Report given to RN and Post -op Vital signs reviewed and stable  Post vital signs: Reviewed and stable  Last Vitals:  Vitals Value Taken Time  BP 149/136 09/09/23 0741  Temp 36 C 09/09/23 0740  Pulse 82 09/09/23 0743  Resp 22 09/09/23 0743  SpO2 96 % 09/09/23 0743  Vitals shown include unfiled device data.  Last Pain:  Vitals:   09/09/23 0740  TempSrc: Temporal  PainSc: Asleep         Complications: No notable events documented.

## 2023-09-09 NOTE — Anesthesia Postprocedure Evaluation (Signed)
 Anesthesia Post Note  Patient: Valerie Ramirez  Procedure(s) Performed: EGD (ESOPHAGOGASTRODUODENOSCOPY)  Patient location during evaluation: PACU Anesthesia Type: General Level of consciousness: awake Pain management: satisfactory to patient Vital Signs Assessment: post-procedure vital signs reviewed and stable Respiratory status: spontaneous breathing and nonlabored ventilation Cardiovascular status: blood pressure returned to baseline Anesthetic complications: no   No notable events documented.   Last Vitals:  Vitals:   09/09/23 0800 09/09/23 0835  BP: (!) 189/72 (!) 202/75  Pulse:  74  Resp:  18  Temp:  37 C  SpO2:  96%    Last Pain:  Vitals:   09/09/23 0835  TempSrc: Oral  PainSc: 0-No pain                 VAN STAVEREN,Jesenia Spera

## 2023-09-09 NOTE — Op Note (Signed)
 [facility] Gastroenterology Patient Name: Valerie Ramirez Procedure Date: 09/09/2023 7:02 AM MRN: [id number] Account #: [id number] Date of Birth: 03-07-1933 Admit Type: Inpatient Age: 89 Room: [facility] ENDO ROOM 3 Gender: Female Note Status: Finalized Instrument Name: Cristino Donna Endoscope [id number] Procedure:             Upper GI endoscopy Indications:           Iron  deficiency anemia Providers:             Marnee Sink MD, MD Referring MD:          Manley Seeds. Tower (Referring MD) Medicines:             Propofol  per Anesthesia Complications:         No immediate complications. Procedure:             Pre-Anesthesia Assessment:                        - Prior to the procedure, a History and Physical was                         performed, and patient medications and allergies were                         reviewed. The patient's tolerance of previous                         anesthesia was also reviewed. The risks and benefits                         of the procedure and the sedation options and risks                         were discussed with the patient. All questions were                         answered, and informed consent was obtained. Prior                         Anticoagulants: The patient has taken no anticoagulant                         or antiplatelet agents. ASA Grade Assessment: II - A                         patient with mild systemic disease. After reviewing                         the risks and benefits, the patient was deemed in                         satisfactory condition to undergo the procedure.                        After obtaining informed consent, the endoscope was                         passed under direct vision. Throughout the procedure,  the patient's blood pressure, pulse, and oxygen                         saturations were monitored continuously. The Endoscope                         was introduced through the mouth, and  advanced to the                         second part of duodenum. The upper GI endoscopy was                         accomplished without difficulty. The patient tolerated                         the procedure well. Findings:      A small hiatal hernia was present.      The stomach was normal.      The examined duodenum was normal. Impression:            - Small hiatal hernia.                        - Normal stomach.                        - Normal examined duodenum.                        - No specimens collected. Recommendation:        - Return patient to hospital ward for ongoing care.                        - Clear liquid diet.                        - Continue present medications.                        - Perform a colonoscopy today. Procedure Code(s):     --- Professional ---                        [phone], Esophagogastroduodenoscopy, flexible,                         transoral; diagnostic, including collection of                         specimen(s) by brushing or washing, when performed                         (separate procedure) Diagnosis Code(s):     --- Professional ---                        D50.9, Iron  deficiency anemia, unspecified CPT copyright 2022 American Medical Association. All rights reserved. The codes documented in this report are preliminary and upon coder review may  be revised to meet current compliance requirements. Marnee Sink MD, MD 09/09/2023 7:43:42 AM This report has been signed electronically. Number of Addenda: 0 Note Initiated On: 09/09/2023 7:02 AM Estimated Blood  Loss:  Estimated blood loss: none.      [facility]

## 2023-09-09 NOTE — Progress Notes (Addendum)
 PROGRESS NOTE    Valerie Ramirez  ZOX:[id number] DOB: 23-Sep-1932 DOA: 09/08/2023 PCP: Clemens Curt, MD     Brief Narrative:   From admission h and p  Valerie Ramirez is a 89 y.o. female with medical history significant of  Atrial fibrillation on Eliquis , hx of recurrent CVA , IDA , GERD, hypothyroidism , vascular dementia, CAD s/p stent 2018, HTN, who presents to ED due to abnormal outpatient labs noting hgb of 6.3.  Due to this patient was referred to ED for further evaluation. Patient notes no sob, no n/v/d no dark stools, no chest pain or fatigue. Per family she has had not complaints but has been more fatigued of late.    Assessment & Plan:   Principal Problem:   GI bleed Active Problems:   Hypothyroidism   Essential hypertension   Obesity (BMI 30-39.9)   Chronic kidney disease, stage 3b (HCC)   Sick sinus syndrome (HCC)   H/O: CVA (cerebrovascular accident)   CAD S/P percutaneous coronary angioplasty   Vascular dementia (HCC)   Paroxysmal atrial fibrillation (HCC)  # Iron  deficiency anemia Presumably from bleeding though no report of such. Hgb was 12.6 7 months ago, now 6. Fatigued last couple of months. Hemodynamically stable, transfused 2 units on 5/22 with appropriate rise. Started apixaban  last Fall after stroke w/u revealed paroxysmal a fib. Unremarkable EGD today. No nsaids. Never had a colonoscopy. - plan for colonoscopy tomorrow - apixaban  on hold  # Diarrhea Nursing says brown diarrhea this morning - c diff, gi pathogen panel  # CAD S/p stent in 2018. Asymptomatic - apixaban  on hold - cont home statin  # HTN, uncontrolled BP quite elevated - cont home losartan  - add amlodipine , hydrochlorothiazide   # CKD 3b Kidney function stable  # History CVA - apixaban  on hold - cont home statin  # Dementia No behavioral disturbance - home donepezil   # Chronic pain - home duloxetine   # Hypothyroid? Doesn't appear to be on meds - f/u TSH  DVT  prophylaxis: SCDs Code Status: limited (ok with CPR but declines intubation) Family Communication: daughter Mariah Shines updated telephonically 5/23  Level of care: Progressive Status is: Inpatient Remains inpatient appropriate because: severity of illness    Consultants:  GI  Procedures: S/p upper endoscopy  Antimicrobials:  none    Subjective: Feeling fine, no pain, tolerating clears  Objective: Vitals:   09/09/23 0740 09/09/23 0750 09/09/23 0800 09/09/23 0835  BP: (!) 165/75 (!) 190/102 (!) 189/72 (!) 202/75  Pulse: 82   74  Resp: 17   18  Temp: (!) 96.8 F (36 C)   98.6 F (37 C)  TempSrc: Temporal   Oral  SpO2: 99%   96%  Weight:      Height:        Intake/Output Summary (Last 24 hours) at 09/09/2023 0948 Last data filed at 09/09/2023 0739 Gross per 24 hour  Intake 1289.07 ml  Output --  Net 1289.07 ml   Filed Weights   09/08/23 1425 09/08/23 2227  Weight: 84 kg 80.5 kg    Examination:  General exam: Appears calm and comfortable  Respiratory system: Clear to auscultation. Respiratory effort normal. Cardiovascular system: S1 & S2 heard, RRR.   Gastrointestinal system: Abdomen is obese, soft and nontender.   Central nervous system: Alert and oriented to self. Moving all 4 Extremities: Symmetric 5 x 5 power. Skin: No rashes, lesions or ulcers Psychiatry: pleasantly confused    Data Reviewed: I have personally reviewed  following labs and imaging studies  CBC: Recent Labs  Lab 09/07/23 0907 09/08/23 1432 09/08/23 1933 09/08/23 2138 09/09/23 0029 09/09/23 0557  WBC 7.5 8.1  --   --   --  10.3  NEUTROABS 4.9  --   --   --   --   --   HGB 6.3 cL* 6.0* 6.9* 6.8* 6.9* 8.6*  HCT 20.7 cL* 21.8*  --   --   --  29.3*  MCV 74.0* 82.3  --   --   --  82.5  PLT 216.0 198  --   --   --  207   Basic Metabolic Panel: Recent Labs  Lab 09/07/23 0907 09/08/23 1432 09/09/23 0557  NA 140 141 134*  K 4.0 3.9 3.3*  CL 107 111 103  CO2 24 25 21*  GLUCOSE 163*  155* 132*  BUN 32* 27* 22  CREATININE 1.90* 1.63* 1.52*  CALCIUM  8.3* 8.6* 8.3*   GFR: Estimated Creatinine Clearance: 24.7 mL/min (A) (by C-G formula based on SCr of 1.52 mg/dL (H)). Liver Function Tests: Recent Labs  Lab 09/07/23 0907 09/08/23 1432 09/09/23 0557  AST 11 19 16   ALT 8 11 10   ALKPHOS 123* 107 117  BILITOT 0.3 0.3 1.0  PROT 6.0 6.4* 6.4*  ALBUMIN 3.8 3.5 3.4*   No results for input(s): "LIPASE", "AMYLASE" in the last 168 hours. No results for input(s): "AMMONIA" in the last 168 hours. Coagulation Profile: Recent Labs  Lab 09/08/23 1432 09/09/23 0557  INR 1.1 1.1   Cardiac Enzymes: No results for input(s): "CKTOTAL", "CKMB", "CKMBINDEX", "TROPONINI" in the last 168 hours. BNP (last 3 results) No results for input(s): "PROBNP" in the last 8760 hours. HbA1C: Recent Labs    09/07/23 0907  HGBA1C 6.2   CBG: Recent Labs  Lab 09/08/23 2229  GLUCAP 111*   Lipid Profile: Recent Labs    09/07/23 0907  CHOL 178  HDL 54.40  LDLCALC 104*  TRIG 98.0  CHOLHDL 3   Thyroid  Function Tests: Recent Labs    09/07/23 0907  TSH 3.14   Anemia Panel: Recent Labs    09/07/23 1548 09/08/23 1432  FERRITIN 8.4* 17  TIBC  --  372  IRON  195* 17*   Urine analysis:    Component Value Date/Time   COLORURINE YELLOW (A) 09/20/2022 1920   APPEARANCEUR HAZY (A) 09/20/2022 1920   LABSPEC 1.023 09/20/2022 1920   PHURINE 5.0 09/20/2022 1920   GLUCOSEU NEGATIVE 09/20/2022 1920   HGBUR NEGATIVE 09/20/2022 1920   BILIRUBINUR NEGATIVE 09/20/2022 1920   BILIRUBINUR Negative 03/25/2020 1231   KETONESUR NEGATIVE 09/20/2022 1920   PROTEINUR >=300 (A) 09/20/2022 1920   UROBILINOGEN 0.2 03/25/2020 1231   UROBILINOGEN 1.0 06/20/2014 1030   NITRITE NEGATIVE 09/20/2022 1920   LEUKOCYTESUR NEGATIVE 09/20/2022 1920   Sepsis Labs: @LABRCNTIP (procalcitonin:4,lacticidven:4)  )No results found for this or any previous visit (from the past 240 hours).        Radiology Studies: No results found.      Scheduled Meds:  acetaminophen   500 mg Oral Daily   amLODipine   10 mg Oral Daily   donepezil   10 mg Oral QHS   DULoxetine   30 mg Oral Daily   hydrochlorothiazide   12.5 mg Oral Daily   losartan   50 mg Oral Daily   pantoprazole  (PROTONIX ) IV  40 mg Intravenous Q12H   polyethylene glycol powder  238 g Oral Once   rosuvastatin   20 mg Oral QHS   Continuous Infusions:  sodium chloride  Stopped (09/08/23 1805)   sodium chloride  Stopped (09/09/23 0739)     LOS: 1 day     Raymonde Calico, MD [facility]   If 7PM-7AM, please contact night-coverage [email or web address] Password [email or web address] 09/09/2023, 9:48 AM

## 2023-09-09 NOTE — Progress Notes (Signed)
 Transition of Care [facility]) - Inpatient Brief Assessment   Patient Details  Name: Valerie Ramirez MRN: [id number] Date of Birth: [birth year]  Transition of [facility] CM/SW Contact:    Jocelyne Reinertsen C Emili Mcloughlin, RN Phone Number: 09/09/2023, 4:01 PM   Clinical Narrative: TOC continuing to follow patient's progress throughout discharge planning.   Transition of Care Asessment: Insurance and Status: Insurance coverage has been reviewed Patient has primary care physician: Yes   Prior level of function:: assisted by family Prior/Current Home Services: No current home services Social Drivers of Health Review: SDOH reviewed no interventions necessary Readmission risk has been reviewed: Yes Transition of care needs: no transition of care needs at this time

## 2023-09-09 NOTE — Plan of Care (Signed)
  Problem: Education: Goal: Knowledge of General Education information will improve Description: Including pain rating scale, medication(s)/side effects and non-pharmacologic comfort measures Outcome: Progressing   Problem: Health Behavior/Discharge Planning: Goal: Ability to manage health-related needs will improve Outcome: Progressing   Problem: Clinical Measurements: Goal: Ability to maintain clinical measurements within normal limits will improve Outcome: Progressing   Problem: Clinical Measurements: Goal: Will remain free from infection Outcome: Progressing   Problem: Clinical Measurements: Goal: Diagnostic test results will improve Outcome: Progressing   Problem: Activity: Goal: Risk for activity intolerance will decrease Outcome: Progressing   Problem: Nutrition: Goal: Adequate nutrition will be maintained Outcome: Progressing   Problem: Safety: Goal: Ability to remain free from injury will improve Outcome: Progressing   Problem: Safety: Goal: Ability to remain free from injury will improve 09/09/2023 0426 by Deryl Flora, RN Outcome: Progressing 09/09/2023 0426 by Deryl Flora, RN Outcome: Progressing   Plan of care ongoing, see MAR see flowsheet

## 2023-09-09 NOTE — Anesthesia Preprocedure Evaluation (Signed)
 Anesthesia Evaluation  Patient identified by MRN, date of birth, ID band Patient awake    Reviewed: Allergy & Precautions, NPO status , Patient's Chart, lab work & pertinent test results  Airway Mallampati: II  TM Distance: >3 FB Neck ROM: full    Dental  (+) Edentulous Upper, Edentulous Lower   Pulmonary neg pulmonary ROS, shortness of breath   Pulmonary exam normal breath sounds clear to auscultation       Cardiovascular Exercise Tolerance: Poor hypertension, Pt. on medications with exertion + CAD and + Cardiac Stents  negative cardio ROS Normal cardiovascular exam Rhythm:Regular Rate:Normal     Neuro/Psych    Depression    negative neurological ROS  negative psych ROS   GI/Hepatic negative GI ROS, Neg liver ROS,GERD  Medicated,,  Endo/Other  negative endocrine ROSHypothyroidism    Renal/GU CRFRenal diseasenegative Renal ROS  negative genitourinary   Musculoskeletal   Abdominal  (+) + obese  Peds negative pediatric ROS (+)  Hematology negative hematology ROS (+)   Anesthesia Other Findings Past Medical History: No date: Arthritis     Comment:  OA No date: CAD (coronary artery disease)     Comment:  stent December 2018 No date: Cancer [facility])     Comment:  endometrial No date: GERD (gastroesophageal reflux disease) No date: Hearing loss No date: History of kidney stones No date: Hyperlipidemia No date: Hypertension No date: Hypothyroid     Comment:  "not in years" No date: Shortness of breath dyspnea     Comment:  04/25/17- not anymore  Past Surgical History: No date: ABDOMINAL HYSTERECTOMY 08/18/2015: ANTERIOR CERVICAL DECOMP/DISCECTOMY FUSION; [name]     Comment:  Procedure: C5-6, C6-7 Anterior Cervical Discectomy and               Fusion, Allograft, Plate;  Surgeon: Adah Acron, MD;                Location: [facility] OR;  Service: Orthopedics;  Laterality: [name]; No date: bone spur removed; Right     Comment:   shoulder 07/04/2012: BOWEL RESECTION     Comment:  Procedure: SMALL BOWEL RESECTION;  Surgeon: Verdia Glad, MD;  Location: [facility] ORS;  Service:               Gynecology;; No date: BREAST BIOPSY No date: CHOLECYSTECTOMY 04/06/2017: CORONARY STENT INTERVENTION; [name]     Comment:  Procedure: CORONARY STENT INTERVENTION;  Surgeon: Wenona Hamilton, MD;  Location: [facility] INVASIVE CV LAB;  Service:               Cardiovascular;  Laterality: [name]; 1999: DILATION AND CURETTAGE OF UTERUS No date: EYE SURGERY; Bilateral     Comment:  cataracts 04/26/2017: FALSE ANEURYSM REPAIR; Right     Comment:  Procedure: 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;  Surgeon: Arvil Lauber, MD;  Location[facility] OR;                Service: Vascular;  Laterality: Right; 12/2008: HAND SURGERY     Comment:  after hand fx/due to fall 07/1998: JOINT REPLACEMENT; Bilateral     Comment:  knees 05/23/1998: KNEE ARTHROPLASTY     Comment:  total right 07/04/2012: LAPAROTOMY; Bilateral     Comment:  Procedure: EXPLORATORY LAPAROTOMY TOTAL ABDOMINAL               HYSTERECTOMY BILATERAL SALPINGO-OOPHORECTOMY with small               bowel resection ;  Surgeon: Verdia Glad, MD;               Location: [facility] ORS;  Service: Gynecology;  Laterality:               Bilateral; 04/06/2017: LEFT HEART CATH AND CORONARY ANGIOGRAPHY; [name]     Comment:  Procedure: LEFT HEART CATH AND CORONARY ANGIOGRAPHY;                Surgeon: Mardell Shade, MD;  Location: [facility] INVASIVE               CV LAB;  Service: Cardiovascular;  Laterality: [name]; 1999: polyp removal 01/12/2022: TEE WITHOUT CARDIOVERSION; [name]     Comment:  Procedure: TRANSESOPHAGEAL ECHOCARDIOGRAM (TEE);                Surgeon: Devorah Fonder, MD;  Location: [facility] ORS;                Service: Cardiovascular;  Laterality: [name]; No date: TUBAL LIGATION  BMI    Body Mass Index: 32.46 kg/m       Reproductive/Obstetrics negative OB ROS                             Anesthesia Physical Anesthesia Plan  ASA: 3  Anesthesia Plan: General   Post-op Pain Management:    Induction: Intravenous  PONV Risk Score and Plan: Propofol  infusion and TIVA  Airway Management Planned: Natural Airway and Nasal Cannula  Additional Equipment:   Intra-op Plan:   Post-operative Plan:   Informed Consent: I have reviewed the patients History and Physical, chart, labs and discussed the procedure including the risks, benefits and alternatives for the proposed anesthesia with the patient or authorized representative who has indicated his/her understanding and acceptance.     Dental Advisory Given  Plan Discussed with: Anesthesiologist, CRNA and Surgeon  Anesthesia Plan Comments:        Anesthesia Quick Evaluation

## 2023-09-09 NOTE — Progress Notes (Signed)
   09/09/23 0835  Assess: MEWS Score  Temp 98.6 F (37 C)  BP (!) 202/75  MAP (mmHg) 107  Pulse Rate 74  Resp 18  Level of Consciousness Alert  SpO2 96 %  O2 Device Room Air  Assess: MEWS Score  MEWS Temp 0  MEWS Systolic 2  MEWS Pulse 0  MEWS RR 0  MEWS LOC 0  MEWS Score 2  MEWS Score Color Yellow  Assess: if the MEWS score is Yellow or Red  Were vital signs accurate and taken at a resting state? Yes  Does the patient meet 2 or more of the SIRS criteria? No  MEWS guidelines implemented  Yes, yellow  Treat  MEWS Interventions Considered administering scheduled or prn medications/treatments as ordered  Take Vital Signs  Increase Vital Sign Frequency  Yellow: Q2hr x1, continue Q4hrs until patient remains green for 12hrs  Escalate  MEWS: Escalate Yellow: Discuss with charge nurse and consider notifying provider and/or RRT  Notify: Charge Nurse/RN  Name of Charge Nurse/RN Community education officer  Provider Notification  Provider Name/Title t  Date Provider Notified 09/09/23  Time Provider Notified 0840  Method of Notification Call  Notification Reason Other (Comment) (yellow mews)  Provider response See new orders  Date of Provider Response 09/09/23  Time of Provider Response 0840  Assess: SIRS CRITERIA  SIRS Temperature  0  SIRS Respirations  0  SIRS Pulse 0  SIRS WBC 0  SIRS Score Sum  0

## 2023-09-09 NOTE — Progress Notes (Signed)
Patient back to room from Endo.  

## 2023-09-10 ENCOUNTER — Inpatient Hospital Stay: Admitting: Anesthesiology

## 2023-09-10 ENCOUNTER — Encounter: Payer: Self-pay | Admitting: Internal Medicine

## 2023-09-10 ENCOUNTER — Encounter
Admission: EM | Disposition: A | Discharge status: Home / Self Care | Payer: Self-pay | Source: Home / Self Care | Attending: Obstetrics and Gynecology

## 2023-09-10 ENCOUNTER — Other Ambulatory Visit: Payer: Self-pay

## 2023-09-10 DIAGNOSIS — K289 Gastrojejunal ulcer, unspecified as acute or chronic, without hemorrhage or perforation: Secondary | ICD-10-CM

## 2023-09-10 DIAGNOSIS — K9189 Other postprocedural complications and disorders of digestive system: Secondary | ICD-10-CM | POA: Diagnosis not present

## 2023-09-10 DIAGNOSIS — K31819 Angiodysplasia of stomach and duodenum without bleeding: Secondary | ICD-10-CM | POA: Diagnosis not present

## 2023-09-10 DIAGNOSIS — K922 Gastrointestinal hemorrhage, unspecified: Secondary | ICD-10-CM | POA: Diagnosis not present

## 2023-09-10 HISTORY — PX: POLYPECTOMY: SHX149

## 2023-09-10 HISTORY — PX: COLONOSCOPY: SHX5424

## 2023-09-10 LAB — CBC
HCT: 28.5 % — ABNORMAL LOW (ref 36.0–46.0)
Hemoglobin: 8.5 g/dL — ABNORMAL LOW (ref 12.0–15.0)
MCH: 24.4 pg — ABNORMAL LOW (ref 26.0–34.0)
MCHC: 29.8 g/dL — ABNORMAL LOW (ref 30.0–36.0)
MCV: 81.7 fL (ref 80.0–100.0)
Platelets: 207 10*3/uL (ref 150–400)
RBC: 3.49 MIL/uL — ABNORMAL LOW (ref 3.87–5.11)
RDW: 17.7 % — ABNORMAL HIGH (ref 11.5–15.5)
WBC: 9.9 10*3/uL (ref 4.0–10.5)
nRBC: 0 % (ref 0.0–0.2)

## 2023-09-10 LAB — BASIC METABOLIC PANEL WITH GFR
Anion gap: 9 (ref 5–15)
BUN: 19 mg/dL (ref 8–23)
CO2: 23 mmol/L (ref 22–32)
Calcium: 8.7 mg/dL — ABNORMAL LOW (ref 8.9–10.3)
Chloride: 107 mmol/L (ref 98–111)
Creatinine, Ser: 1.54 mg/dL — ABNORMAL HIGH (ref 0.44–1.00)
GFR, Estimated: 32 mL/min — ABNORMAL LOW (ref >60)
Glucose, Bld: 135 mg/dL — ABNORMAL HIGH (ref 70–99)
Potassium: 3.3 mmol/L — ABNORMAL LOW (ref 3.5–5.1)
Sodium: 139 mmol/L (ref 135–145)

## 2023-09-10 SURGERY — COLONOSCOPY
Anesthesia: General

## 2023-09-10 MED ORDER — PROPOFOL 10 MG/ML IV BOLUS
INTRAVENOUS | Status: AC
  Filled 2023-09-10: qty 20

## 2023-09-10 MED ORDER — PROPOFOL 10 MG/ML IV BOLUS
INTRAVENOUS | Status: DC | PRN
  Administered 2023-09-10: 20 mg via INTRAVENOUS
  Administered 2023-09-10: 30 mg via INTRAVENOUS
  Administered 2023-09-10 (×2): 20 mg via INTRAVENOUS
  Administered 2023-09-10: 30 mg via INTRAVENOUS
  Administered 2023-09-10: 50 mg via INTRAVENOUS
  Administered 2023-09-10: 30 mg via INTRAVENOUS

## 2023-09-10 MED ORDER — IRON SUCROSE 200 MG IVPB - SIMPLE MED
200.0000 mg | Freq: Every day | Status: DC
  Administered 2023-09-10 – 2023-09-11 (×2): 200 mg via INTRAVENOUS
  Filled 2023-09-10 (×2): qty 200

## 2023-09-10 MED ORDER — LIDOCAINE 2% (20 MG/ML) 5 ML SYRINGE
INTRAMUSCULAR | Status: DC | PRN
  Administered 2023-09-10: 50 mg via INTRAVENOUS

## 2023-09-10 NOTE — Progress Notes (Signed)
 PROGRESS NOTE    Valerie Ramirez  ZOX:[id number] DOB: 09-23-1932 DOA: 09/08/2023 PCP: Clemens Curt, MD     Brief Narrative:   From admission h and p  Valerie Ramirez is a 89 y.o. female with medical history significant of  Atrial fibrillation on Eliquis , hx of recurrent CVA , IDA , GERD, hypothyroidism , vascular dementia, CAD s/p stent 2018, HTN, who presents to ED due to abnormal outpatient labs noting hgb of 6.3.  Due to this patient was referred to ED for further evaluation. Patient notes no sob, no n/v/d no dark stools, no chest pain or fatigue. Per family she has had not complaints but has been more fatigued of late.    Assessment & Plan:   Principal Problem:   GI bleed Active Problems:   Hypothyroidism   Essential hypertension   Obesity (BMI 30-39.9)   Chronic kidney disease, stage 3b (HCC)   Sick sinus syndrome (HCC)   H/O: CVA (cerebrovascular accident)   CAD S/P percutaneous coronary angioplasty   Vascular dementia (HCC)   Paroxysmal atrial fibrillation (HCC)   Iron  deficiency anemia  # Iron  deficiency anemia Presumably from bleeding though no report of such. Hgb was 12.6 7 months ago, now 6. Fatigued last couple of months. Hemodynamically stable, transfused 2 units on 5/22 with appropriate rise, hgb now stable in the 8s. Started apixaban  last Fall after stroke w/u revealed paroxysmal a fib. Unremarkable EGD on 5/23, colonoscopy today with polyps but no bleeding source. Patient denies nsaids.   - capsule endoscopy underway - apixaban  on hold - GI has ordered a dose of IV iron   # Diarrhea On first day of admission. C diff and gi pathogen panel negative - monitor  # CAD S/p stent in 2018. Asymptomatic - apixaban  on hold - cont home statin  # HTN, uncontrolled BP moderately elevated today, was severely elevated on admission - cont home losartan  - added amlodipine , hydrochlorothiazide   # CKD 3b Kidney function stable  # History CVA - apixaban  on hold as  above - cont home statin  # Dementia No behavioral disturbance - home donepezil   # Chronic pain - home duloxetine   # Hypothyroid? Doesn't appear to be on meds. TSH wnl  DVT prophylaxis: SCDs Code Status: limited (ok with CPR but declines intubation) Family Communication: daughter Mariah Shines updated at bedside 5/24  Level of care: Progressive Status is: Inpatient Remains inpatient appropriate because: severity of illness    Consultants:  GI  Procedures: Upper endoscopy Colonoscopy Capsule endoscopy  Antimicrobials:  none    Subjective: Feeling fine, no pain, tolerated colonoscopy  Objective: Vitals:   09/10/23 0359 09/10/23 0816 09/10/23 1204 09/10/23 1214  BP: (!) 156/64 (!) 177/76 (!) 127/95 (!) 160/59  Pulse: 80 86 79 79  Resp: 19  18 18   Temp: 97.7 F (36.5 C) 98.3 F (36.8 C) 98 F (36.7 C) 98 F (36.7 C)  TempSrc:   Temporal Temporal  SpO2: 93% 94% 100% 96%  Weight:      Height:        Intake/Output Summary (Last 24 hours) at 09/10/2023 1326 Last data filed at 09/09/2023 2000 Gross per 24 hour  Intake 840 ml  Output --  Net 840 ml   Filed Weights   09/08/23 1425 09/08/23 2227  Weight: 84 kg 80.5 kg    Examination:  General exam: Appears calm and comfortable  Respiratory system: Clear to auscultation. Respiratory effort normal. Cardiovascular system: S1 & S2 heard, RRR.   Gastrointestinal  system: Abdomen is obese, soft and nontender.   Central nervous system: Alert and oriented to self. Moving all 4 Extremities: Symmetric 5 x 5 power. Skin: No rashes, lesions or ulcers Psychiatry: pleasantly confused    Data Reviewed: I have personally reviewed following labs and imaging studies  CBC: Recent Labs  Lab 09/07/23 0907 09/08/23 1432 09/08/23 1933 09/08/23 2138 09/09/23 0029 09/09/23 0557 09/10/23 0551  WBC 7.5 8.1  --   --   --  10.3 9.9  NEUTROABS 4.9  --   --   --   --   --   --   HGB 6.3 cL* 6.0* 6.9* 6.8* 6.9* 8.6* 8.5*  HCT  20.7 cL* 21.8*  --   --   --  29.3* 28.5*  MCV 74.0* 82.3  --   --   --  82.5 81.7  PLT 216.0 198  --   --   --  207 207   Basic Metabolic Panel: Recent Labs  Lab 09/07/23 0907 09/08/23 1432 09/09/23 0557 09/10/23 0551  NA 140 141 134* 139  K 4.0 3.9 3.3* 3.3*  CL 107 111 103 107  CO2 24 25 21* 23  GLUCOSE 163* 155* 132* 135*  BUN 32* 27* 22 19  CREATININE 1.90* 1.63* 1.52* 1.54*  CALCIUM  8.3* 8.6* 8.3* 8.7*   GFR: Estimated Creatinine Clearance: 24.4 mL/min (A) (by C-G formula based on SCr of 1.54 mg/dL (H)). Liver Function Tests: Recent Labs  Lab 09/07/23 0907 09/08/23 1432 09/09/23 0557  AST 11 19 16   ALT 8 11 10   ALKPHOS 123* 107 117  BILITOT 0.3 0.3 1.0  PROT 6.0 6.4* 6.4*  ALBUMIN 3.8 3.5 3.4*   No results for input(s): "LIPASE", "AMYLASE" in the last 168 hours. No results for input(s): "AMMONIA" in the last 168 hours. Coagulation Profile: Recent Labs  Lab 09/08/23 1432 09/09/23 0557  INR 1.1 1.1   Cardiac Enzymes: No results for input(s): "CKTOTAL", "CKMB", "CKMBINDEX", "TROPONINI" in the last 168 hours. BNP (last 3 results) No results for input(s): "PROBNP" in the last 8760 hours. HbA1C: No results for input(s): "HGBA1C" in the last 72 hours.  CBG: Recent Labs  Lab 09/08/23 2229  GLUCAP 111*   Lipid Profile: No results for input(s): "CHOL", "HDL", "LDLCALC", "TRIG", "CHOLHDL", "LDLDIRECT" in the last 72 hours.  Thyroid  Function Tests: Recent Labs    09/09/23 0557  TSH 3.075   Anemia Panel: Recent Labs    09/07/23 1548 09/08/23 1432  FERRITIN 8.4* 17  TIBC  --  372  IRON  195* 17*   Urine analysis:    Component Value Date/Time   COLORURINE YELLOW (A) 09/20/2022 1920   APPEARANCEUR HAZY (A) 09/20/2022 1920   LABSPEC 1.023 09/20/2022 1920   PHURINE 5.0 09/20/2022 1920   GLUCOSEU NEGATIVE 09/20/2022 1920   HGBUR NEGATIVE 09/20/2022 1920   BILIRUBINUR NEGATIVE 09/20/2022 1920   BILIRUBINUR Negative 03/25/2020 1231   KETONESUR  NEGATIVE 09/20/2022 1920   PROTEINUR >=300 (A) 09/20/2022 1920   UROBILINOGEN 0.2 03/25/2020 1231   UROBILINOGEN 1.0 06/20/2014 1030   NITRITE NEGATIVE 09/20/2022 1920   LEUKOCYTESUR NEGATIVE 09/20/2022 1920   Sepsis Labs: @LABRCNTIP (procalcitonin:4,lacticidven:4)  ) Recent Results (from the past 240 hours)  C Difficile Quick Screen w PCR reflex     Status: None   Collection Time: 09/09/23 10:25 AM   Specimen: STOOL  Result Value Ref Range Status   C Diff antigen NEGATIVE NEGATIVE Final   C Diff toxin NEGATIVE NEGATIVE Final  C Diff interpretation No C. difficile detected.  Final    Comment: Performed at [facility], [address] [place], [place] [address]  Gastrointestinal Panel by PCR , Stool     Status: None   Collection Time: 09/09/23 10:25 AM   Specimen: STOOL  Result Value Ref Range Status   Campylobacter species NOT DETECTED NOT DETECTED Final   Plesimonas shigelloides NOT DETECTED NOT DETECTED Final   Salmonella species NOT DETECTED NOT DETECTED Final   Yersinia enterocolitica NOT DETECTED NOT DETECTED Final   Vibrio species NOT DETECTED NOT DETECTED Final   Vibrio cholerae NOT DETECTED NOT DETECTED Final   Enteroaggregative E coli (EAEC) NOT DETECTED NOT DETECTED Final   Enteropathogenic E coli (EPEC) NOT DETECTED NOT DETECTED Final   Enterotoxigenic E coli (ETEC) NOT DETECTED NOT DETECTED Final   Shiga like toxin producing E coli (STEC) NOT DETECTED NOT DETECTED Final   Shigella/Enteroinvasive E coli (EIEC) NOT DETECTED NOT DETECTED Final   Cryptosporidium NOT DETECTED NOT DETECTED Final   Cyclospora cayetanensis NOT DETECTED NOT DETECTED Final   Entamoeba histolytica NOT DETECTED NOT DETECTED Final   Giardia lamblia NOT DETECTED NOT DETECTED Final   Adenovirus F40/41 NOT DETECTED NOT DETECTED Final   Astrovirus NOT DETECTED NOT DETECTED Final   Norovirus GI/GII NOT DETECTED NOT DETECTED Final   Rotavirus A NOT DETECTED NOT DETECTED Final    Sapovirus (I, II, IV, and V) NOT DETECTED NOT DETECTED Final    Comment: Performed at [facility], [address], [place], [place] [address]         Radiology Studies: No results found.      Scheduled Meds:  acetaminophen   500 mg Oral Daily   amLODipine   10 mg Oral Daily   donepezil   10 mg Oral QHS   DULoxetine   30 mg Oral Daily   hydrochlorothiazide   12.5 mg Oral Daily   losartan   50 mg Oral Daily   rosuvastatin   20 mg Oral QHS   Continuous Infusions:  sodium chloride  10 mL/hr (09/10/23 1137)   iron  sucrose       LOS: 2 days     Raymonde Calico, MD [facility]   If 7PM-7AM, please contact night-coverage [email or web address] Password [email or web address] 09/10/2023, 1:26 PM

## 2023-09-10 NOTE — Anesthesia Postprocedure Evaluation (Signed)
 Anesthesia Post Note  Patient: Valerie Ramirez  Procedure(s) Performed: COLONOSCOPY POLYPECTOMY, INTESTINE  Patient location during evaluation: Endoscopy Anesthesia Type: General Level of consciousness: awake and alert Pain management: pain level controlled Vital Signs Assessment: post-procedure vital signs reviewed and stable Respiratory status: spontaneous breathing, nonlabored ventilation, respiratory function stable and patient connected to nasal cannula oxygen Cardiovascular status: blood pressure returned to baseline and stable Postop Assessment: no apparent nausea or vomiting Anesthetic complications: no   No notable events documented.   Last Vitals:  Vitals:   09/10/23 1204 09/10/23 1214  BP: (!) 127/95 (!) 160/59  Pulse: 79 79  Resp: 18 18  Temp: 36.7 C 36.7 C  SpO2: 100% 96%    Last Pain:  Vitals:   09/10/23 1214  TempSrc: Temporal  PainSc: 0-No pain                 Portia Brittle Matthe Sloane

## 2023-09-10 NOTE — Anesthesia Preprocedure Evaluation (Addendum)
 Anesthesia Evaluation  Patient identified by MRN, date of birth, ID band Patient awake    Reviewed: Allergy & Precautions, NPO status , Patient's Chart, lab work & pertinent test results  History of Anesthesia Complications Negative for: history of anesthetic complications  Airway Mallampati: III  TM Distance: <3 FB Neck ROM: full    Dental  (+) Upper Dentures, Lower Dentures   Pulmonary shortness of breath   Pulmonary exam normal        Cardiovascular hypertension, (-) angina + CAD  Normal cardiovascular exam     Neuro/Psych  Headaches PSYCHIATRIC DISORDERS     Dementia    GI/Hepatic Neg liver ROS,GERD  Controlled,,  Endo/Other  Hypothyroidism    Renal/GU Renal disease  negative genitourinary   Musculoskeletal   Abdominal   Peds  Hematology negative hematology ROS (+)   Anesthesia Other Findings Past Medical History: No date: Arthritis     Comment:  OA No date: CAD (coronary artery disease)     Comment:  stent December 2018 No date: Cancer [facility])     Comment:  endometrial No date: GERD (gastroesophageal reflux disease) No date: Hearing loss No date: History of kidney stones No date: Hyperlipidemia No date: Hypertension No date: Hypothyroid     Comment:  "not in years" No date: Shortness of breath dyspnea     Comment:  04/25/17- not anymore  Past Surgical History: No date: ABDOMINAL HYSTERECTOMY 08/18/2015: ANTERIOR CERVICAL DECOMP/DISCECTOMY FUSION; [name]     Comment:  Procedure: C5-6, C6-7 Anterior Cervical Discectomy and               Fusion, Allograft, Plate;  Surgeon: Adah Acron, MD;                Location: [facility] OR;  Service: Orthopedics;  Laterality: [name]; No date: bone spur removed; Right     Comment:  shoulder 07/04/2012: BOWEL RESECTION     Comment:  Procedure: SMALL BOWEL RESECTION;  Surgeon: Verdia Glad, MD;  Location: [facility] ORS;  Service:               Gynecology;; No  date: BREAST BIOPSY No date: CHOLECYSTECTOMY 04/06/2017: CORONARY STENT INTERVENTION; [name]     Comment:  Procedure: CORONARY STENT INTERVENTION;  Surgeon: Wenona Hamilton, MD;  Location: [facility] INVASIVE CV LAB;  Service:               Cardiovascular;  Laterality: [name]; 1999: DILATION AND CURETTAGE OF UTERUS No date: EYE SURGERY; Bilateral     Comment:  cataracts 04/26/2017: FALSE ANEURYSM REPAIR; Right     Comment:  Procedure: 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;  Surgeon: Arvil Lauber, MD;  Location: [facility] OR;                Service: Vascular;  Laterality: Right; 12/2008: HAND SURGERY     Comment:  after hand fx/due to fall 07/1998: JOINT REPLACEMENT; Bilateral     Comment:  knees 05/23/1998: KNEE ARTHROPLASTY     Comment:  total right 07/04/2012: LAPAROTOMY; Bilateral     Comment:  Procedure: EXPLORATORY LAPAROTOMY TOTAL ABDOMINAL               HYSTERECTOMY BILATERAL SALPINGO-OOPHORECTOMY with small  bowel resection ;  Surgeon: Verdia Glad, MD;               Location: [facility] ORS;  Service: Gynecology;  Laterality:               Bilateral; 04/06/2017: LEFT HEART CATH AND CORONARY ANGIOGRAPHY; [name]     Comment:  Procedure: LEFT HEART CATH AND CORONARY ANGIOGRAPHY;                Surgeon: Mardell Shade, MD;  Location: [facility] INVASIVE               CV LAB;  Service: Cardiovascular;  Laterality: [name]; 1999: polyp removal 01/12/2022: TEE WITHOUT CARDIOVERSION; [name]     Comment:  Procedure: TRANSESOPHAGEAL ECHOCARDIOGRAM (TEE);                Surgeon: Devorah Fonder, MD;  Location: [facility] ORS;                Service: Cardiovascular;  Laterality: [name]; No date: TUBAL LIGATION  BMI    Body Mass Index: 32.46 kg/m      Reproductive/Obstetrics negative OB ROS                             Anesthesia Physical Anesthesia Plan  ASA: 3  Anesthesia Plan: General   Post-op Pain Management:    Induction:  Intravenous  PONV Risk Score and Plan: Propofol  infusion and TIVA  Airway Management Planned: Natural Airway and Nasal Cannula  Additional Equipment:   Intra-op Plan:   Post-operative Plan:   Informed Consent: I have reviewed the patients History and Physical, chart, labs and discussed the procedure including the risks, benefits and alternatives for the proposed anesthesia with the patient or authorized representative who has indicated his/her understanding and acceptance.     Dental Advisory Given  Plan Discussed with: Anesthesiologist, CRNA and Surgeon  Anesthesia Plan Comments: (History and phone consent from the patients daughter Emiliano Hard at [phone]  Daughter consented for risks of anesthesia including but not limited to:  - adverse reactions to medications - risk of airway placement if required - damage to eyes, teeth, lips or other oral mucosa - nerve damage due to positioning  - sore throat or hoarseness - Damage to heart, brain, nerves, lungs, other parts of body or loss of life  She voiced understanding and assent.)       Anesthesia Quick Evaluation

## 2023-09-10 NOTE — Transfer of Care (Signed)
 Immediate Anesthesia Transfer of Care Note  Patient: Valerie Ramirez  Procedure(s) Performed: COLONOSCOPY POLYPECTOMY, INTESTINE  Patient Location: Endoscopy Unit  Anesthesia Type:General  Level of Consciousness: drowsy  Airway & Oxygen Therapy: Patient Spontanous Breathing and Patient connected to nasal cannula oxygen  Post-op Assessment: Report given to RN and Post -op Vital signs reviewed and stable  Post vital signs: Reviewed and stable  Last Vitals:  Vitals Value Taken Time  BP 127/95   Temp 36.7 C 09/10/23 1204  Pulse 85   Resp 13   SpO2 100     Last Pain:  Vitals:   09/10/23 1204  TempSrc: Temporal  PainSc:       Patients Stated Pain Goal: 0 (09/10/23 0241)  Complications: No notable events documented.

## 2023-09-10 NOTE — Plan of Care (Signed)

## 2023-09-10 NOTE — Op Note (Signed)
 [facility] Gastroenterology Patient Name: Valerie Ramirez Procedure Date: 09/10/2023 9:03 AM MRN: [id number] Account #: [id number] Date of Birth: 1933/01/08 Admit Type: Outpatient Age: 89 Room: [facility] ENDO ROOM 4 Gender: Female Note Status: Finalized Instrument Name: Charlyn Cooley [id number] Procedure:             Colonoscopy Indications:           Unexplained iron  deficiency anemia Providers:             Selena Daily MD, MD Referring MD:          Manley Seeds. Tower (Referring MD) Complications:         No immediate complications. Estimated blood loss: None. Procedure:             Pre-Anesthesia Assessment:                        - Prior to the procedure, a History and Physical was                         performed, and patient medications and allergies were                         reviewed. The patient is unable to give consent                         secondary to the patient being legally incompetent to                         consent. The risks and benefits of the procedure and                         the sedation options and risks were discussed with the                         patient's daughter. All questions were answered and                         informed consent was obtained. Patient identification                         and proposed procedure were verified by the physician,                         the nurse, the anesthesiologist, the anesthetist and                         the technician in the pre-procedure area in the                         procedure room in the endoscopy suite. Mental Status                         Examination: dementia. Airway Examination: normal                         oropharyngeal airway and neck mobility. Respiratory  Examination: clear to auscultation. CV Examination:                         normal. Prophylactic Antibiotics: The patient does not                         require prophylactic antibiotics. Prior                          Anticoagulants: The patient has taken Eliquis                          (apixaban ), last dose was 2 days prior to procedure.                         ASA Grade Assessment: III - A patient with severe                         systemic disease. After reviewing the risks and                         benefits, the patient was deemed in satisfactory                         condition to undergo the procedure. The anesthesia                         plan was to use general anesthesia. Immediately prior                         to administration of medications, the patient was                         re-assessed for adequacy to receive sedatives. The                         heart rate, respiratory rate, oxygen saturations,                         blood pressure, adequacy of pulmonary ventilation, and                         response to care were monitored throughout the                         procedure. The physical status of the patient was                         re-assessed after the procedure.                        After obtaining informed consent, the colonoscope was                         passed under direct vision. Throughout the procedure,                         the patient's blood pressure, pulse, and oxygen  saturations were monitored continuously. The                         Colonoscope was introduced through the anus and                         advanced to the the terminal ileum, with                         identification of the appendiceal orifice and IC                         valve. The colonoscopy was performed without                         difficulty. The patient tolerated the procedure well.                         The quality of the bowel preparation was good. The                         terminal ileum, ileocecal valve, appendiceal orifice,                         and rectum were photographed. Findings:      The perianal and  digital rectal examinations were normal. Pertinent       negatives include normal sphincter tone and no palpable rectal lesions.      The terminal ileum appeared normal.      A 7 mm polyp was found in the sigmoid colon. The polyp was sessile. The       polyp was removed with a cold snare. Resection and retrieval were       complete. Estimated blood loss was minimal. To prevent bleeding after       the polypectomy, one hemostatic clip was successfully placed (MR safe).       Clip manufacturer: [employer]. There was no bleeding at the end       of the procedure.      A 12 mm polyp was found in the sigmoid colon. The polyp was       semi-pedunculated. The polyp was removed with a hot snare. Resection and       retrieval were complete. To prevent bleeding after the polypectomy, one       hemostatic clip was successfully placed (MR safe). Clip manufacturer:       [employer]. There was no bleeding during, or at the end, of the       procedure. Estimated blood loss: none.      Non-bleeding external hemorrhoids were found during retroflexion. The       hemorrhoids were medium-sized. Impression:            - The examined portion of the ileum was normal.                        - One 7 mm polyp in the sigmoid colon, removed with a                         cold snare. Resected and retrieved. Clip manufacturer:  [employer]. Clip (MR safe) was placed.                        - One 12 mm polyp in the sigmoid colon, removed with a                         hot snare. Resected and retrieved. Clip manufacturer:                         [employer]. Clip (MR safe) was placed.                        - Non-bleeding external hemorrhoids. Recommendation:        - To visualize the small bowel, perform video capsule                         endoscopy today.                        - Clear liquid diet today.                        - Continue present medications.                         - Return patient to hospital ward for ongoing care. Procedure Code(s):     --- Professional ---                        [id number], Colonoscopy, flexible; with removal of                         tumor(s), polyp(s), or other lesion(s) by snare                         technique Diagnosis Code(s):     --- Professional ---                        K64.4, Residual hemorrhoidal skin tags                        D12.5, Benign neoplasm of sigmoid colon                        D50.9, Iron  deficiency anemia, unspecified CPT copyright 2022 American Medical Association. All rights reserved. The codes documented in this report are preliminary and upon coder review may  be revised to meet current compliance requirements. Dr. Evia Hof Selena Daily MD, MD 09/10/2023 12:09:20 PM This report has been signed electronically. Number of Addenda: 0 Note Initiated On: 09/10/2023 9:03 AM Scope Withdrawal Time: 0 hours 12 minutes 36 seconds  Total Procedure Duration: 0 hours 19 minutes 38 seconds  Estimated Blood Loss:  Estimated blood loss: none.      [facility]

## 2023-09-11 ENCOUNTER — Inpatient Hospital Stay

## 2023-09-11 DIAGNOSIS — K922 Gastrointestinal hemorrhage, unspecified: Secondary | ICD-10-CM | POA: Diagnosis not present

## 2023-09-11 DIAGNOSIS — K289 Gastrojejunal ulcer, unspecified as acute or chronic, without hemorrhage or perforation: Secondary | ICD-10-CM

## 2023-09-11 DIAGNOSIS — K5520 Angiodysplasia of colon without hemorrhage: Secondary | ICD-10-CM

## 2023-09-11 LAB — BASIC METABOLIC PANEL WITH GFR
Anion gap: 11 (ref 5–15)
BUN: 17 mg/dL (ref 8–23)
CO2: 24 mmol/L (ref 22–32)
Calcium: 8.8 mg/dL — ABNORMAL LOW (ref 8.9–10.3)
Chloride: 105 mmol/L (ref 98–111)
Creatinine, Ser: 1.62 mg/dL — ABNORMAL HIGH (ref 0.44–1.00)
GFR, Estimated: 30 mL/min — ABNORMAL LOW (ref >60)
Glucose, Bld: 146 mg/dL — ABNORMAL HIGH (ref 70–99)
Potassium: 3.6 mmol/L (ref 3.5–5.1)
Sodium: 140 mmol/L (ref 135–145)

## 2023-09-11 LAB — CBC
HCT: 30.2 % — ABNORMAL LOW (ref 36.0–46.0)
Hemoglobin: 9.3 g/dL — ABNORMAL LOW (ref 12.0–15.0)
MCH: 24.9 pg — ABNORMAL LOW (ref 26.0–34.0)
MCHC: 30.8 g/dL (ref 30.0–36.0)
MCV: 81 fL (ref 80.0–100.0)
Platelets: 220 10*3/uL (ref 150–400)
RBC: 3.73 MIL/uL — ABNORMAL LOW (ref 3.87–5.11)
RDW: 18.4 % — ABNORMAL HIGH (ref 11.5–15.5)
WBC: 19.1 10*3/uL — ABNORMAL HIGH (ref 4.0–10.5)
nRBC: 0 % (ref 0.0–0.2)

## 2023-09-11 MED ORDER — SODIUM CHLORIDE 0.9 % IV BOLUS
500.0000 mL | Freq: Once | INTRAVENOUS | Status: AC
  Administered 2023-09-11: 500 mL via INTRAVENOUS

## 2023-09-11 MED ORDER — AMLODIPINE BESYLATE 10 MG PO TABS: 10.0000 mg | ORAL_TABLET | Freq: Every day | ORAL | 1 refills | Status: AC

## 2023-09-11 NOTE — Plan of Care (Signed)

## 2023-09-11 NOTE — Discharge Summary (Signed)
 Valerie Ramirez:[id number] DOB: [birth year] DOA: 09/08/2023  PCP: Clemens Curt, MD  Admit date: 09/08/2023 Discharge date: 09/11/2023  Time spent: 35 minutes  Recommendations for Outpatient Follow-up:  F/u pcp 1 week, check hemoglobin then GI f/u (Dr. Baldomero Bone) who will place referral to [facility]     Discharge Diagnoses:  Principal Problem:   GI bleed Active Problems:   Hypothyroidism   Essential hypertension   Obesity (BMI 30-39.9)   Chronic kidney disease, stage 3b (HCC)   Sick sinus syndrome (HCC)   H/O: CVA (cerebrovascular accident)   CAD S/P percutaneous coronary angioplasty   Vascular dementia (HCC)   Paroxysmal atrial fibrillation (HCC)   Iron  deficiency anemia   Angiodysplasia of small intestine, except duodenum without bleeding   Chronic anastomotic ulcer   Discharge Condition: stable  Diet recommendation: heart healthy  Filed Weights   09/08/23 1425 09/08/23 2227  Weight: 84 kg 80.5 kg    History of present illness:  From admission h and p Valerie Ramirez is a 89 y.o. female with medical history significant of  Atrial fibrillation on Eliquis , hx of recurrent CVA , IDA , GERD, hypothyroidism , vascular dementia, CAD s/p stent 2018, HTN, who presents to ED due to abnormal outpatient labs noting hgb of 6.3.  Due to this patient was referred to ED for further evaluation. Patient notes no sob, no n/v/d no dark stools, no chest pain or fatigue. Per family she has had not complaints but has been more fatigued of late.   Hospital Course:  Patient presents referred from clinic for incidental anemia found on labs. Found to have severe iron  deficiency anemia with hgb of 6, was in the 12s 7 months ago. Patient was started on apixaban  for new dx of a-fib after recent stroke w/u. No report of bleeding. EGD and colonoscopy unrevealing but capsule endoscopy reveals ulcer at anastomosis of prior small bowel resection, as well as a small bowel AVM. Either or both  of those are the likely cause of bleeding. Here patient was transfused 2 units with appropriate rise to the 9s, also received IV iron . No clinical signs of bleeding here. No nsaids at home. Attempt was made to transfer to [facility] for double balloon enteroscopy but [facility] declined the transfer citing the patient's stability, said this is a procedure that will need to be arranged in the outpatient realm. Dr. Baldomero Bone will process that referral. In terms of anticoagulation, patient's chads2vasc score is 7 with an 11.2% stroke risk per year. We engaged in shared decision making discussing risks and benefits of anticoagulation versus no anticoagulation. Patient and daughter have opted to resume apixaban , with plan for close monitoring (daughter will monitor her stooling, and I have advised close f/u with pcp for serial monitoring of hemoglobin). Daughter will seek medical attention for any signs or symptoms of GI or other bleeding. Of note, patient's BP persistently elevated here, amlodipine  initiated, will need outpt f/u with PCP.   Procedures: Upper endoscopy, colonoscopy, capsule endoscopy   Consultations: GI  Discharge Exam: Vitals:   09/11/23 0808 09/11/23 1159  BP: (!) 164/76 (!) 171/61  Pulse: 87 92  Resp: 18 19  Temp: 98.5 F (36.9 C) 97.7 F (36.5 C)  SpO2: 94% 93%    General: NAD Cardiovascular: RRR Respiratory: CTAB Abdomen: soft, non-tender  Discharge Instructions   Discharge Instructions     Diet - low sodium heart healthy   Complete by: As directed    Increase activity slowly  Complete by: As directed       Allergies as of 09/11/2023       Reactions   Allopurinol  Nausea Only   Lipitor [atorvastatin  Calcium ] Itching, Other (See Comments)   Leg aches and aching all over   Morphine  And Codeine Other (See Comments)   Makes the patient "loopy,"    Simvastatin Other (See Comments)   MUSCLE PAIN   Tape Itching, Rash, Other (See Comments)   "Cannot use paper tape" --- also  causes blisters. Use plastic tape        Medication List     TAKE these medications    albuterol  108 (90 Base) MCG/ACT inhaler Commonly known as: VENTOLIN  HFA Inhale 2 puffs into the lungs every 4 (four) hours as needed for wheezing or shortness of breath.   amLODipine  10 MG tablet Commonly known as: NORVASC  Take 1 tablet (10 mg total) by mouth daily. Start taking on: Sep 12, 2023   colchicine  0.6 MG tablet TAKE 1 TABLET BY MOUTH DAILY   diclofenac  Sodium 1 % Gel Commonly known as: Voltaren  Arthritis Pain Apply 4 g topically 4 (four) times daily. What changed:  when to take this reasons to take this   donepezil  10 MG tablet Commonly known as: ARICEPT  Take 1 tablet (10 mg total) by mouth at bedtime.   DULoxetine  30 MG capsule Commonly known as: CYMBALTA  TAKE 1 CAPSULE BY MOUTH DAILY   Eliquis  2.5 MG Tabs tablet Generic drug: apixaban  TAKE 1 TABLET BY MOUTH TWICE A DAY   famotidine  20 MG tablet Commonly known as: PEPCID  TAKE 1 TABLET BY MOUTH 2 TIMES A DAY   FeroSul 325 (65 Fe) MG tablet Generic drug: ferrous sulfate  TAKE 1 TABLET BY MOUTH DAILY What changed: when to take this   lidocaine  5 % Commonly known as: Lidoderm  Place 1 patch onto the skin daily. Remove & Discard patch within 12 hours or as directed by MD   losartan  50 MG tablet Commonly known as: COZAAR  Take 1 tablet (50 mg total) by mouth daily.   MAG-SR PLUS CALCIUM  PO Take by mouth. Taking 1 tab once for dizziness   mupirocin  ointment 2 % Commonly known as: BACTROBAN  APPLY TWO TIMES A DAY TO AFFECTED AREA   nitroGLYCERIN  0.4 MG SL tablet Commonly known as: NITROSTAT  Place 1 tablet (0.4 mg total) under the tongue every 5 (five) minutes x 3 doses as needed for chest pain.   rosuvastatin  20 MG tablet Commonly known as: CRESTOR  TAKE 1 TABLET BY MOUTH AT BEDTIME   TYLENOL  8 HOUR PO Take 3 tablets by mouth daily.   Vitamin D-3 25 MCG (1000 UT) Caps Take 1,000 Units by mouth daily.        Allergies  Allergen Reactions   Allopurinol  Nausea Only   Lipitor [Atorvastatin  Calcium ] Itching and Other (See Comments)    Leg aches and aching all over   Morphine  And Codeine Other (See Comments)    Makes the patient "loopy,"    Simvastatin Other (See Comments)    MUSCLE PAIN   Tape Itching, Rash and Other (See Comments)    "Cannot use paper tape" --- also causes blisters. Use plastic tape    Follow-up Information     Vanga, Elson Halon, MD Follow up.   Specialty: Gastroenterology Contact information: [address] [place] [address] [phone]         Clemens Curt, MD Follow up.   Specialties: Family Medicine, Radiology Why: in about 1 week Contact  information: [address] [place] [address] [phone]                  The results of significant diagnostics from this hospitalization (including imaging, microbiology, ancillary and laboratory) are listed below for reference.    Significant Diagnostic Studies: CT Head Wo Contrast Result Date: 08/31/2023 CLINICAL DATA:  Fall, head injury.  Head and neck pain. EXAM: CT HEAD WITHOUT CONTRAST CT CERVICAL SPINE WITHOUT CONTRAST TECHNIQUE: Multidetector CT imaging of the head and cervical spine was performed following the standard protocol without intravenous contrast. Multiplanar CT image reconstructions of the cervical spine were also generated. RADIATION DOSE REDUCTION: This exam was performed according to the departmental dose-optimization program which includes automated exposure control, adjustment of the mA and/or kV according to patient size and/or use of iterative reconstruction technique. COMPARISON:  09/20/2022 FINDINGS: CT HEAD FINDINGS Brain: Normal anatomic configuration. Parenchymal volume loss is commensurate with the patient's age. Stable, advanced periventricular white matter changes are present likely reflecting the sequela of small vessel ischemia. Remote lacunar  infarct within the right thalamus and cortical infarct within the right occipital cortex again noted. No abnormal intra or extra-axial mass lesion or fluid collection. No abnormal mass effect or midline shift. No evidence of acute intracranial hemorrhage or infarct. Ventricular size is normal. Cerebellum unremarkable. Vascular: No asymmetric hyperdense vasculature at the skull base. Skull: Intact Sinuses/Orbits: Paranasal sinuses are clear. Orbits are unremarkable. Other: Mastoid air cells and middle ear cavities are clear. Mild right occipital soft tissue swelling. CT CERVICAL SPINE FINDINGS Alignment: Stable 2 mm anterolisthesis C7-T1. Otherwise normal cervical lordosis. Skull base and vertebrae: Craniocervical alignment is normal. The atlantodental interval is not widened. No acute fracture of the cervical spine. Anterior cervical discectomy infusion with solid ankylosis of the vertebral bodies of C5-C7. Ankylosis of the facet joints C6-T1 bilaterally. Soft tissues and spinal canal: No prevertebral fluid or swelling. No visible canal hematoma. Central disc herniation at C3-4 abuts and mildly indents the thecal sac, similar to prior examination. There is resultant moderate central canal stenosis with an AP diameter of the spinal canal of 7 mm at this level, unchanged. Moderate bilateral carotid bifurcation calcification Disc levels: There is endplate remodeling throughout cervical spine in keeping with changes moderate degenerative disc disease. Prevertebral soft tissues are not thickened on sagittal reformats. Multilevel uncovertebral and facet arthrosis results in moderate to severe bilateral neuroforaminal narrowing at C3-4 and C4-5 Upper chest: Negative. Other: None IMPRESSION: 1. No acute intracranial hemorrhage or infarct. 2. No acute fracture or malalignment of the cervical spine. 3. Stable central disc herniation at C3-4 resulting in moderate central canal stenosis. 4. Multilevel degenerative disc disease  and facet arthrosis resulting in moderate to severe bilateral neuroforaminal narrowing at C3-4 and C4-5. Electronically Signed   By: Worthy Heads M.D.   On: 08/31/2023 19:30   CT Cervical Spine Wo Contrast Result Date: 08/31/2023 CLINICAL DATA:  Fall, head injury.  Head and neck pain. EXAM: CT HEAD WITHOUT CONTRAST CT CERVICAL SPINE WITHOUT CONTRAST TECHNIQUE: Multidetector CT imaging of the head and cervical spine was performed following the standard protocol without intravenous contrast. Multiplanar CT image reconstructions of the cervical spine were also generated. RADIATION DOSE REDUCTION: This exam was performed according to the departmental dose-optimization program which includes automated exposure control, adjustment of the mA and/or kV according to patient size and/or use of iterative reconstruction technique. COMPARISON:  09/20/2022 FINDINGS: CT HEAD FINDINGS Brain: Normal anatomic configuration. Parenchymal volume loss is  commensurate with the patient's age. Stable, advanced periventricular white matter changes are present likely reflecting the sequela of small vessel ischemia. Remote lacunar infarct within the right thalamus and cortical infarct within the right occipital cortex again noted. No abnormal intra or extra-axial mass lesion or fluid collection. No abnormal mass effect or midline shift. No evidence of acute intracranial hemorrhage or infarct. Ventricular size is normal. Cerebellum unremarkable. Vascular: No asymmetric hyperdense vasculature at the skull base. Skull: Intact Sinuses/Orbits: Paranasal sinuses are clear. Orbits are unremarkable. Other: Mastoid air cells and middle ear cavities are clear. Mild right occipital soft tissue swelling. CT CERVICAL SPINE FINDINGS Alignment: Stable 2 mm anterolisthesis C7-T1. Otherwise normal cervical lordosis. Skull base and vertebrae: Craniocervical alignment is normal. The atlantodental interval is not widened. No acute fracture of the cervical  spine. Anterior cervical discectomy infusion with solid ankylosis of the vertebral bodies of C5-C7. Ankylosis of the facet joints C6-T1 bilaterally. Soft tissues and spinal canal: No prevertebral fluid or swelling. No visible canal hematoma. Central disc herniation at C3-4 abuts and mildly indents the thecal sac, similar to prior examination. There is resultant moderate central canal stenosis with an AP diameter of the spinal canal of 7 mm at this level, unchanged. Moderate bilateral carotid bifurcation calcification Disc levels: There is endplate remodeling throughout cervical spine in keeping with changes moderate degenerative disc disease. Prevertebral soft tissues are not thickened on sagittal reformats. Multilevel uncovertebral and facet arthrosis results in moderate to severe bilateral neuroforaminal narrowing at C3-4 and C4-5 Upper chest: Negative. Other: None IMPRESSION: 1. No acute intracranial hemorrhage or infarct. 2. No acute fracture or malalignment of the cervical spine. 3. Stable central disc herniation at C3-4 resulting in moderate central canal stenosis. 4. Multilevel degenerative disc disease and facet arthrosis resulting in moderate to severe bilateral neuroforaminal narrowing at C3-4 and C4-5. Electronically Signed   By: Worthy Heads M.D.   On: 08/31/2023 19:30   CUP PACEART REMOTE DEVICE CHECK Result Date: 08/15/2023 ILR summary report received. Battery status OK. Normal device function. No new symptom, tachy, brady, or pause episodes. 2 new AF episodes, longest duration 1hr , controlled rates, burden <1%, Eliquis  per PA report.  Monthly summary reports and ROV/PRN LA, CVRS   Microbiology: Recent Results (from the past 240 hours)  C Difficile Quick Screen w PCR reflex     Status: None   Collection Time: 09/09/23 10:25 AM   Specimen: STOOL  Result Value Ref Range Status   C Diff antigen NEGATIVE NEGATIVE Final   C Diff toxin NEGATIVE NEGATIVE Final   C Diff interpretation No  C. difficile detected.  Final    Comment: Performed at [facility], [address] [place], [place] [address]  Gastrointestinal Panel by PCR , Stool     Status: None   Collection Time: 09/09/23 10:25 AM   Specimen: STOOL  Result Value Ref Range Status   Campylobacter species NOT DETECTED NOT DETECTED Final   Plesimonas shigelloides NOT DETECTED NOT DETECTED Final   Salmonella species NOT DETECTED NOT DETECTED Final   Yersinia enterocolitica NOT DETECTED NOT DETECTED Final   Vibrio species NOT DETECTED NOT DETECTED Final   Vibrio cholerae NOT DETECTED NOT DETECTED Final   Enteroaggregative E coli (EAEC) NOT DETECTED NOT DETECTED Final   Enteropathogenic E coli (EPEC) NOT DETECTED NOT DETECTED Final   Enterotoxigenic E coli (ETEC) NOT DETECTED NOT DETECTED Final   Shiga like toxin producing E coli (STEC) NOT DETECTED NOT DETECTED Final  Shigella/Enteroinvasive E coli (EIEC) NOT DETECTED NOT DETECTED Final   Cryptosporidium NOT DETECTED NOT DETECTED Final   Cyclospora cayetanensis NOT DETECTED NOT DETECTED Final   Entamoeba histolytica NOT DETECTED NOT DETECTED Final   Giardia lamblia NOT DETECTED NOT DETECTED Final   Adenovirus F40/41 NOT DETECTED NOT DETECTED Final   Astrovirus NOT DETECTED NOT DETECTED Final   Norovirus GI/GII NOT DETECTED NOT DETECTED Final   Rotavirus A NOT DETECTED NOT DETECTED Final   Sapovirus (I, II, IV, and V) NOT DETECTED NOT DETECTED Final    Comment: Performed at [facility], [address] [place], [place] [address]     Labs: Basic Metabolic Panel: Recent Labs  Lab 09/07/23 0907 09/08/23 1432 09/09/23 0557 09/10/23 0551 09/11/23 0439  NA 140 141 134* 139 140  K 4.0 3.9 3.3* 3.3* 3.6  CL 107 111 103 107 105  CO2 24 25 21* 23 24  GLUCOSE 163* 155* 132* 135* 146*  BUN 32* 27* 22 19 17   CREATININE 1.90* 1.63* 1.52* 1.54* 1.62*  CALCIUM  8.3* 8.6* 8.3* 8.7* 8.8*   Liver Function Tests: Recent Labs  Lab 09/07/23 0907  09/08/23 1432 09/09/23 0557  AST 11 19 16   ALT 8 11 10   ALKPHOS 123* 107 117  BILITOT 0.3 0.3 1.0  PROT 6.0 6.4* 6.4*  ALBUMIN 3.8 3.5 3.4*   No results for input(s): "LIPASE", "AMYLASE" in the last 168 hours. No results for input(s): "AMMONIA" in the last 168 hours. CBC: Recent Labs  Lab 09/07/23 0907 09/08/23 1432 09/08/23 1933 09/08/23 2138 09/09/23 0029 09/09/23 0557 09/10/23 0551 09/11/23 0439  WBC 7.5 8.1  --   --   --  10.3 9.9 19.1*  NEUTROABS 4.9  --   --   --   --   --   --   --   HGB 6.3 cL* 6.0*   < > 6.8* 6.9* 8.6* 8.5* 9.3*  HCT 20.7 cL* 21.8*  --   --   --  29.3* 28.5* 30.2*  MCV 74.0* 82.3  --   --   --  82.5 81.7 81.0  PLT 216.0 198  --   --   --  207 207 220   < > = values in this interval not displayed.   Cardiac Enzymes: No results for input(s): "CKTOTAL", "CKMB", "CKMBINDEX", "TROPONINI" in the last 168 hours. BNP: BNP (last 3 results) No results for input(s): "BNP" in the last 8760 hours.  ProBNP (last 3 results) No results for input(s): "PROBNP" in the last 8760 hours.  CBG: Recent Labs  Lab 09/08/23 2229  GLUCAP 111*       Signed:  Raymonde Calico MD.  [facility] 09/11/2023, 5:07 PM

## 2023-09-11 NOTE — Care Management Important Message (Signed)
 Important Message  Patient Details  Name: ILDA LASKIN MRN: [id number] Date of Birth: Sep 13, 1932   Important Message Given:  Yes - Medicare IM     Anise Kerns 09/11/2023, 12:57 PM

## 2023-09-11 NOTE — Progress Notes (Signed)
 VCE results  Findings: Capsule reached cecum, Rapid small bowel transit time, h/o small bowel resection 9cm in length  in 2014 secondary to adhesions from endometrial carcinoma.  Clean based entero-enteric anastomotic ulcer at 2:11 and Non bleeding AVM likely in mid small bowel, unable to assess the approximate location of AVM in specific portion of small  bowel  due to h/o resection and rapid small bowel transit time of only 1hr57min  Recommendations Source of melena and IDA secondary to bleeding from anastamotic ulcer and AVM Recommend balloon enteroscopy at tertiary care facility At risk for recurrent bleeding, chronic blood loss with reinitiation of anticoagulation, need to weigh risks and benefits  Karma Oz, MD [place] [facility], [facility] [address]  [address]  [place], [place] [address]  Main: [phone]  Fax: [phone] Pager: [phone]

## 2023-09-11 NOTE — Progress Notes (Signed)
 PROGRESS NOTE    Valerie Ramirez  ZOX:[id number] DOB: [birth year] DOA: 09/08/2023 PCP: Clemens Curt, MD     Brief Narrative:   From admission h and p  Valerie Ramirez is a 89 y.o. female with medical history significant of  Atrial fibrillation on Eliquis , hx of recurrent CVA , IDA , GERD, hypothyroidism , vascular dementia, CAD s/p stent 2018, HTN, who presents to ED due to abnormal outpatient labs noting hgb of 6.3.  Due to this patient was referred to ED for further evaluation. Patient notes no sob, no n/v/d no dark stools, no chest pain or fatigue. Per family she has had not complaints but has been more fatigued of late.    Assessment & Plan:   Principal Problem:   GI bleed Active Problems:   Hypothyroidism   Essential hypertension   Obesity (BMI 30-39.9)   Chronic kidney disease, stage 3b (HCC)   Sick sinus syndrome (HCC)   H/O: CVA (cerebrovascular accident)   CAD S/P percutaneous coronary angioplasty   Vascular dementia (HCC)   Paroxysmal atrial fibrillation (HCC)   Iron  deficiency anemia   Angiodysplasia of small intestine, except duodenum without bleeding   Chronic anastomotic ulcer  # Iron  deficiency anemia Presumably from bleeding though no report of such. Hgb was 12.6 7 months ago, now 6. Fatigued last couple of months. Hemodynamically stable, transfused 2 units on 5/22 with appropriate rise, hgb now stable in the 8s. Started apixaban  last Fall after stroke w/u revealed paroxysmal a fib. Unremarkable EGD on 5/23, colonoscopy 5/24 with polyps but no bleeding source. Patient denies nsaids.   - apixaban  on hold - GI has ordered a dose of IV iron   # Entero-enteric anastomotic ulcer # AVM Seen on capsule endoscopy, likely the source(s) of bleeding - double balloon enteroscopy advised by GI, pursuing transfer to [facility] for that  # Diarrhea On first day of admission. C diff and gi pathogen panel negative - monitor  # CAD S/p stent in 2018. Asymptomatic - apixaban   on hold - cont home statin  # HTN, uncontrolled BP moderately elevated, was severely elevated on admission - cont home losartan  - added amlodipine , hydrochlorothiazide  - CT enteroscopy advised by GI, ordered  # CKD 3b Kidney function stable  # History CVA - apixaban  on hold as above - cont home statin  # Dementia No behavioral disturbance - home donepezil   # Chronic pain - home duloxetine   # Hypothyroid? Doesn't appear to be on meds. TSH wnl  DVT prophylaxis: SCDs Code Status: limited (ok with CPR but declines intubation) Family Communication: daughter and son updated at bedside 5/25  Level of care: Progressive Status is: Inpatient Remains inpatient appropriate because: severity of illness    Consultants:  GI  Procedures: Upper endoscopy Colonoscopy Capsule endoscopy  Antimicrobials:  none    Subjective: Feeling fine, no pain, no bleeding  Objective: Vitals:   09/10/23 1556 09/10/23 2305 09/11/23 0808 09/11/23 1159  BP: (!) 185/70 (!) 177/70 (!) 164/76 (!) 171/61  Pulse: 85 100 87 92  Resp:  20 18 19   Temp: (!) 97.5 F (36.4 C) 98.4 F (36.9 C) 98.5 F (36.9 C) 97.7 F (36.5 C)  TempSrc:  Oral    SpO2: 97% 98% 94% 93%  Weight:      Height:        Intake/Output Summary (Last 24 hours) at 09/11/2023 1443 Last data filed at 09/11/2023 0948 Gross per 24 hour  Intake 382.3 ml  Output --  Net  382.3 ml   Filed Weights   09/08/23 1425 09/08/23 2227  Weight: 84 kg 80.5 kg    Examination:  General exam: Appears calm and comfortable  Respiratory system: Clear to auscultation. Respiratory effort normal. Cardiovascular system: S1 & S2 heard, RRR.   Gastrointestinal system: Abdomen is obese, soft and nontender.   Central nervous system: Alert and oriented to self. Moving all 4 Extremities: Symmetric 5 x 5 power. Skin: No rashes, lesions or ulcers Psychiatry: pleasantly confused    Data Reviewed: I have personally reviewed following labs and  imaging studies  CBC: Recent Labs  Lab 09/07/23 0907 09/08/23 1432 09/08/23 1933 09/08/23 2138 09/09/23 0029 09/09/23 0557 09/10/23 0551 09/11/23 0439  WBC 7.5 8.1  --   --   --  10.3 9.9 19.1*  NEUTROABS 4.9  --   --   --   --   --   --   --   HGB 6.3 cL* 6.0*   < > 6.8* 6.9* 8.6* 8.5* 9.3*  HCT 20.7 cL* 21.8*  --   --   --  29.3* 28.5* 30.2*  MCV 74.0* 82.3  --   --   --  82.5 81.7 81.0  PLT 216.0 198  --   --   --  207 207 220   < > = values in this interval not displayed.   Basic Metabolic Panel: Recent Labs  Lab 09/07/23 0907 09/08/23 1432 09/09/23 0557 09/10/23 0551 09/11/23 0439  NA 140 141 134* 139 140  K 4.0 3.9 3.3* 3.3* 3.6  CL 107 111 103 107 105  CO2 24 25 21* 23 24  GLUCOSE 163* 155* 132* 135* 146*  BUN 32* 27* 22 19 17   CREATININE 1.90* 1.63* 1.52* 1.54* 1.62*  CALCIUM  8.3* 8.6* 8.3* 8.7* 8.8*   GFR: Estimated Creatinine Clearance: 23.2 mL/min (A) (by C-G formula based on SCr of 1.62 mg/dL (H)). Liver Function Tests: Recent Labs  Lab 09/07/23 0907 09/08/23 1432 09/09/23 0557  AST 11 19 16   ALT 8 11 10   ALKPHOS 123* 107 117  BILITOT 0.3 0.3 1.0  PROT 6.0 6.4* 6.4*  ALBUMIN 3.8 3.5 3.4*   No results for input(s): "LIPASE", "AMYLASE" in the last 168 hours. No results for input(s): "AMMONIA" in the last 168 hours. Coagulation Profile: Recent Labs  Lab 09/08/23 1432 09/09/23 0557  INR 1.1 1.1   Cardiac Enzymes: No results for input(s): "CKTOTAL", "CKMB", "CKMBINDEX", "TROPONINI" in the last 168 hours. BNP (last 3 results) No results for input(s): "PROBNP" in the last 8760 hours. HbA1C: No results for input(s): "HGBA1C" in the last 72 hours.  CBG: Recent Labs  Lab 09/08/23 2229  GLUCAP 111*   Lipid Profile: No results for input(s): "CHOL", "HDL", "LDLCALC", "TRIG", "CHOLHDL", "LDLDIRECT" in the last 72 hours.  Thyroid  Function Tests: Recent Labs    09/09/23 0557  TSH 3.075   Anemia Panel: No results for input(s):  "VITAMINB12", "FOLATE", "FERRITIN", "TIBC", "IRON ", "RETICCTPCT" in the last 72 hours.  Urine analysis:    Component Value Date/Time   COLORURINE YELLOW (A) 09/20/2022 1920   APPEARANCEUR HAZY (A) 09/20/2022 1920   LABSPEC 1.023 09/20/2022 1920   PHURINE 5.0 09/20/2022 1920   GLUCOSEU NEGATIVE 09/20/2022 1920   HGBUR NEGATIVE 09/20/2022 1920   BILIRUBINUR NEGATIVE 09/20/2022 1920   BILIRUBINUR Negative 03/25/2020 1231   KETONESUR NEGATIVE 09/20/2022 1920   PROTEINUR >=300 (A) 09/20/2022 1920   UROBILINOGEN 0.2 03/25/2020 1231   UROBILINOGEN 1.0 06/20/2014 1030  NITRITE NEGATIVE 09/20/2022 1920   LEUKOCYTESUR NEGATIVE 09/20/2022 1920   Sepsis Labs: @LABRCNTIP (procalcitonin:4,lacticidven:4)  ) Recent Results (from the past 240 hours)  C Difficile Quick Screen w PCR reflex     Status: None   Collection Time: 09/09/23 10:25 AM   Specimen: STOOL  Result Value Ref Range Status   C Diff antigen NEGATIVE NEGATIVE Final   C Diff toxin NEGATIVE NEGATIVE Final   C Diff interpretation No C. difficile detected.  Final    Comment: Performed at [facility], [address] [place], [place] [address]  Gastrointestinal Panel by PCR , Stool     Status: None   Collection Time: 09/09/23 10:25 AM   Specimen: STOOL  Result Value Ref Range Status   Campylobacter species NOT DETECTED NOT DETECTED Final   Plesimonas shigelloides NOT DETECTED NOT DETECTED Final   Salmonella species NOT DETECTED NOT DETECTED Final   Yersinia enterocolitica NOT DETECTED NOT DETECTED Final   Vibrio species NOT DETECTED NOT DETECTED Final   Vibrio cholerae NOT DETECTED NOT DETECTED Final   Enteroaggregative E coli (EAEC) NOT DETECTED NOT DETECTED Final   Enteropathogenic E coli (EPEC) NOT DETECTED NOT DETECTED Final   Enterotoxigenic E coli (ETEC) NOT DETECTED NOT DETECTED Final   Shiga like toxin producing E coli (STEC) NOT DETECTED NOT DETECTED Final   Shigella/Enteroinvasive E coli (EIEC) NOT DETECTED  NOT DETECTED Final   Cryptosporidium NOT DETECTED NOT DETECTED Final   Cyclospora cayetanensis NOT DETECTED NOT DETECTED Final   Entamoeba histolytica NOT DETECTED NOT DETECTED Final   Giardia lamblia NOT DETECTED NOT DETECTED Final   Adenovirus F40/41 NOT DETECTED NOT DETECTED Final   Astrovirus NOT DETECTED NOT DETECTED Final   Norovirus GI/GII NOT DETECTED NOT DETECTED Final   Rotavirus A NOT DETECTED NOT DETECTED Final   Sapovirus (I, II, IV, and V) NOT DETECTED NOT DETECTED Final    Comment: Performed at [facility], [address] [place], [place] [address]         Radiology Studies: No results found.      Scheduled Meds:  acetaminophen   500 mg Oral Daily   amLODipine   10 mg Oral Daily   donepezil   10 mg Oral QHS   DULoxetine   30 mg Oral Daily   hydrochlorothiazide   12.5 mg Oral Daily   losartan   50 mg Oral Daily   rosuvastatin   20 mg Oral QHS   Continuous Infusions:  sodium chloride  10 mL/hr (09/10/23 1513)   iron  sucrose 200 mg (09/11/23 0957)   sodium chloride        LOS: 3 days     Raymonde Calico, MD [facility]   If 7PM-7AM, please contact night-coverage [email or web address] Password [facility] 09/11/2023, 2:43 PM

## 2023-09-12 ENCOUNTER — Encounter: Payer: Self-pay | Admitting: Gastroenterology

## 2023-09-13 ENCOUNTER — Telehealth: Payer: Self-pay

## 2023-09-13 LAB — TYPE AND SCREEN
ABO/RH(D): A NEG
Antibody Screen: NEGATIVE
Unit division: 0
Unit division: 0
Unit division: 0

## 2023-09-13 LAB — BPAM RBC
Blood Product Expiration Date: 202506022359
Blood Product Expiration Date: 202506082359
Blood Product Expiration Date: 202506082359
ISSUE DATE / TIME: 202505221754
ISSUE DATE / TIME: 202505230205
Unit Type and Rh: 600
Unit Type and Rh: 600
Unit Type and Rh: 600

## 2023-09-13 NOTE — Telephone Encounter (Signed)
 Urgent referral faxed x 3

## 2023-09-13 NOTE — Transitions of Care (Post Inpatient/ED Visit) (Signed)
 09/13/2023  Name: Valerie Ramirez MRN: [id number] DOB: 1932-12-09  Today's TOC FU Call Status: Today's TOC FU Call Status:: Successful TOC FU Call Completed TOC FU Call Complete Date: 09/13/23 Patient's Name and Date of Birth confirmed.  Transition Care Management Follow-up Telephone Call Date of Discharge: 09/11/23 Discharge Facility: [facility]) Type of Discharge: Inpatient Admission Primary Inpatient Discharge Diagnosis:: GI bleed How have you been since you were released from the hospital?: Better Any questions or concerns?: No  Items Reviewed: Did you receive and understand the discharge instructions provided?: Yes Medications obtained,verified, and reconciled?: Yes (Medications Reviewed) Any new allergies since your discharge?: No Dietary orders reviewed?: Yes Do you have support at home?: Yes People in Home [RPT]: child(ren), adult  Medications Reviewed Today: Medications Reviewed Today     Reviewed by Darrall Ellison, LPN (Licensed Practical Nurse) on 09/13/23 at 1127  Med List Status: <None>   Medication Order Taking? Sig Documenting Provider Last Dose Status Informant  Acetaminophen  (TYLENOL  8 HOUR PO) [id number] No Take 3 tablets by mouth daily. [provider] Taking Active Child, Pharmacy Records           Med Note [facility], ELIZABETH A   Thu Sep 08, 2023  6:44 PM) PRN  albuterol  (VENTOLIN  HFA) 108 (90 Base) MCG/ACT inhaler [id number] No Inhale 2 puffs into the lungs every 4 (four) hours as needed for wheezing or shortness of breath.  Patient not taking: Reported on 09/07/2023   Kasa, Kurian, MD Not Taking Active Child, Pharmacy Records  amLODipine  (NORVASC ) 10 MG tablet [id number]  Take 1 tablet (10 mg total) by mouth daily. Wouk, Haynes Lips, MD  Active   Cholecalciferol (VITAMIN D-3) 25 MCG (1000 UT) CAPS [id number] No Take 1,000 Units by mouth daily.  Patient not taking: Reported on 09/08/2023   [provider] Not  Taking Active Child, Pharmacy Records  colchicine  0.6 MG tablet [id number] No TAKE 1 TABLET BY MOUTH DAILY Tower, Manley Seeds, MD 09/08/2023 Morning Active Child, Pharmacy Records  diclofenac  Sodium (VOLTAREN  ARTHRITIS PAIN) 1 % GEL [id number] No Apply 4 g topically 4 (four) times daily.  Patient taking differently: Apply 4 g topically as needed.   Glory Larsen, MD Taking Active Child, Pharmacy Records  donepezil  (ARICEPT ) 10 MG tablet [id number] No Take 1 tablet (10 mg total) by mouth at bedtime. Lisabeth Rider, MD 09/07/2023 Evening Active Child, Pharmacy Records  DULoxetine  (CYMBALTA ) 30 MG capsule [id number] No TAKE 1 CAPSULE BY MOUTH DAILY Tower, Manley Seeds, MD 09/08/2023 Morning Active Child, Pharmacy Records  ELIQUIS  2.5 MG TABS tablet [id number] No TAKE 1 TABLET BY MOUTH TWICE A DAY Mealor, Donnamae Gaba, MD 09/08/2023 Morning Active Child, Pharmacy Records  famotidine  (PEPCID ) 20 MG tablet [id number] No TAKE 1 TABLET BY MOUTH 2 TIMES A DAY Tower, Manley Seeds, MD 09/08/2023 Morning Active Child, Pharmacy Records  ferrous sulfate  (FEROSUL) 325 (65 FE) MG tablet [id number] No TAKE 1 TABLET BY MOUTH DAILY  Patient taking differently: Take 325 mg by mouth every other day.   Tower, Manley Seeds, MD 09/05/2023 Active Child, Pharmacy Records  lidocaine  (LIDODERM ) 5 % [id number]  Place 1 patch onto the skin daily. Remove & Discard patch within 12 hours or as directed by MD Glory Larsen, MD  Active Child, Pharmacy Records           Med Note [facility], Dibble A   Thu Sep 08, 2023  6:49 PM) PRN  losartan  (COZAAR ) 50 MG tablet [id number] No  Take 1 tablet (50 mg total) by mouth daily. Tower, Manley Seeds, MD 09/08/2023 Morning Active Child, Pharmacy Records  Magnesium Chloride (MAG-SR PLUS CALCIUM  PO) [id number] No Take by mouth. Taking 1 tab once for dizziness  Patient not taking: Reported on 09/08/2023   [provider] Not Taking Active Child, Pharmacy Records  mupirocin  ointment (BACTROBAN ) 2 % [id number] No APPLY TWO  TIMES A DAY TO AFFECTED AREA Tower, Manley Seeds, MD Taking Active Child, Pharmacy Records  nitroGLYCERIN  (NITROSTAT ) 0.4 MG SL tablet [id number] No Place 1 tablet (0.4 mg total) under the tongue every 5 (five) minutes x 3 doses as needed for chest pain.  Patient not taking: Reported on 09/08/2023   [name], Andra Kava, PA-C Not Taking Active Child, Pharmacy Records           Med Note [facility], [facility] M   Wed Aug 11, 2022  9:36 AM) Not needed in last 54mo  rosuvastatin  (CRESTOR ) 20 MG tablet [id number] No TAKE 1 TABLET BY MOUTH AT BEDTIME [place], Peter M, MD 09/08/2023 Morning Active Child, Pharmacy Records            Home Care and Equipment/Supplies: Were Home Health Services Ordered?: NA Any new equipment or medical supplies ordered?: NA  Functional Questionnaire: Do you need assistance with bathing/showering or dressing?: No Do you need assistance with meal preparation?: No Do you need assistance with eating?: No Do you have difficulty maintaining continence: No Do you have difficulty managing or taking your medications?: Yes  Follow up appointments reviewed: PCP Follow-up appointment confirmed?: Yes Date of PCP follow-up appointment?: 09/22/23 Follow-up Provider: [facility] Follow-up appointment confirmed?: No Reason Specialist Follow-Up Not Confirmed: Patient has Specialist Provider Number and will Call for Appointment Do you need transportation to your follow-up appointment?: No Do you understand care options if your condition(s) worsen?: Yes-patient verbalized understanding    SIGNATURE Darrall Ellison, LPN [facility] Nurse Health Advisor Direct Dial [phone]

## 2023-09-13 NOTE — Telephone Encounter (Signed)
-----   Message from [facility] sent at 09/12/2023 11:47 AM EDT ----- Regarding: Urgent Referral Orry Sigl  Please place urgent referral to [facility] for double balloon enteroscopy Admitted with Severe IDA, mid/distal small bowel AVM, entero-enteric anastomotic ulcer, on eliquis  H/o endometrial carcinoma, small bowel adhesions, small bowel resection with primary anastomosis in 2014  Thanks RV

## 2023-09-14 ENCOUNTER — Other Ambulatory Visit: Payer: Self-pay | Admitting: Neurology

## 2023-09-14 LAB — SURGICAL PATHOLOGY

## 2023-09-19 ENCOUNTER — Ambulatory Visit (INDEPENDENT_AMBULATORY_CARE_PROVIDER_SITE_OTHER): Payer: Self-pay

## 2023-09-19 ENCOUNTER — Ambulatory Visit: Admitting: Internal Medicine

## 2023-09-19 ENCOUNTER — Ambulatory Visit: Payer: Self-pay

## 2023-09-19 DIAGNOSIS — Z8673 Personal history of transient ischemic attack (TIA), and cerebral infarction without residual deficits: Secondary | ICD-10-CM

## 2023-09-19 LAB — CUP PACEART REMOTE DEVICE CHECK
Date Time Interrogation Session: 20250602060213
Implantable Pulse Generator Implant Date: 20231116
Pulse Gen Model: 5000
Pulse Gen Serial Number: 511019978

## 2023-09-21 ENCOUNTER — Ambulatory Visit: Payer: Self-pay | Admitting: Cardiovascular Disease

## 2023-09-22 ENCOUNTER — Inpatient Hospital Stay: Admitting: Family Medicine

## 2023-09-22 NOTE — Progress Notes (Deleted)
 Subjective:    Patient ID: Valerie Ramirez, female    DOB: 20-May-1932, 89 y.o.   MRN: [id number]  HPI  Wt Readings from Last 3 Encounters:  09/08/23 177 lb 7.5 oz (80.5 kg)  09/07/23 185 lb 4 oz (84 kg)  08/31/23 180 lb (81.6 kg)      There were no vitals filed for this visit.  Pt presents for follow up of hospitalization for severe anemia/ GI bleed  Was hosp from 5/22 to 5/25 Admitted with new low hb of 6.3 in setting of anticoagularion for a fib (eliquis ) Severe iron  def  Was relatively asymptomatic and denied GI symptoms EGD and colonoscopy were unrevealing  Capsule endoscopy noted ulcer at anastomosis  of prior small bowel resection as well as AVM  (Not actively bleeding)   Transfused 2 u  Received IV iron    Noted eventual need for double balloon enteroscopy ([facility] in the future)  Dr Baldomero Bone is current GI specialist   In light of afib with chads 2vasc score of 7, pt and family cohse to re start the eliquis  with close monitoring    Lab Results  Component Value Date   NA 140 09/11/2023   K 3.6 09/11/2023   CO2 24 09/11/2023   GLUCOSE 146 (H) 09/11/2023   BUN 17 09/11/2023   CREATININE 1.62 (H) 09/11/2023   CALCIUM  8.8 (L) 09/11/2023   GFR 23.19 (L) 09/07/2023   EGFR 29 (L) 02/09/2021   GFRNONAA 30 (L) 09/11/2023   Pt has a referral to nephrology for CKD recently worsened    Lab Results  Component Value Date   ALT 10 09/09/2023   AST 16 09/09/2023   ALKPHOS 117 09/09/2023   BILITOT 1.0 09/09/2023   Lab Results  Component Value Date   WBC 19.1 (H) 09/11/2023   HGB 9.3 (L) 09/11/2023   HCT 30.2 (L) 09/11/2023   MCV 81.0 09/11/2023   PLT 220 09/11/2023   Lab Results  Component Value Date   IRON  17 (L) 09/08/2023   TIBC 372 09/08/2023   FERRITIN 17 09/08/2023        Patient Active Problem List   Diagnosis Date Noted   Angiodysplasia of small intestine, except duodenum without bleeding 09/11/2023   Chronic anastomotic ulcer  09/11/2023   Paroxysmal atrial fibrillation (HCC) 09/09/2023   Iron  deficiency anemia 09/09/2023   GI bleed 09/08/2023   Poor balance 09/07/2023   Chronic cough 12/17/2022   Dysphagia 12/17/2022   History of atrial fibrillation 03/17/2022   Coronary artery disease of native artery of native heart with stable angina pectoris (HCC) 02/01/2022   Vascular dementia (HCC) 01/10/2022   Dyslipidemia 01/09/2022   Mild cognitive impairment 01/06/2022   Fall 06/24/2021   Syncope and collapse 06/24/2021   Acute pain of both knees 06/24/2021   Light headedness 06/24/2021   Bilateral impacted cerumen 06/24/2021   Heme positive stool 02/24/2021   History of CVA (cerebrovascular accident) 02/14/2020   Headache 02/14/2020   Dizziness 12/26/2017   Depression 10/26/2017   H/O subdural hemorrhage 09/18/2017   CAD S/P percutaneous coronary angioplasty 09/18/2017   Epistaxis    Elevated transaminase level 08/03/2017   Radial artery injury, left, sequela 06/15/2017   H/O: CVA (cerebrovascular accident) 05/26/2017   Sick sinus syndrome (HCC) 05/25/2017   Chronic kidney disease, stage 3b (HCC) 04/05/2017   Anemia 04/05/2017   Thoracic back pain 09/15/2016   History of radius fracture 06/01/2016   AKI (acute kidney injury) (HCC)  04/25/2016   Leukocytosis 04/25/2016   Status post cervical spinal fusion 08/18/2015   Acute pain of right shoulder 05/16/2015   Osteoarthritis, hip, bilateral 08/12/2014   Hearing loss in right ear 06/12/2014   Colon cancer screening 08/10/2013   Seborrheic keratoses, inflamed 08/10/2013   History of fall 04/10/2013   Encounter for Medicare annual wellness exam 08/08/2012   Gout 07/14/2012   History of endometrial cancer 05/25/2012   Degenerative joint disease of cervical spine 05/03/2011   Other screening mammogram 03/31/2011   Prediabetes 09/26/2007   Obesity (BMI 30-39.9) 07/12/2007   Essential hypertension 07/11/2007   History of cardiomyopathy 07/11/2007    Osteoarthritis 07/11/2007   MIXED INCONTINENCE URGE AND STRESS 07/11/2007   Hypothyroidism 10/05/2006   HYPERCHOLESTEROLEMIA, PURE 10/05/2006   Past Medical History:  Diagnosis Date   Arthritis    OA   CAD (coronary artery disease)    stent December 2018   Cancer [facility])    endometrial   GERD (gastroesophageal reflux disease)    Hearing loss    History of kidney stones    Hyperlipidemia    Hypertension    Hypothyroid    "not in years"   Shortness of breath dyspnea    04/25/17- not anymore   Past Surgical History:  Procedure Laterality Date   ABDOMINAL HYSTERECTOMY     ANTERIOR CERVICAL DECOMP/DISCECTOMY FUSION [name] 08/18/2015   Procedure: C5-6, C6-7 Anterior Cervical Discectomy and Fusion, Allograft, Plate;  Surgeon: Adah Acron, MD;  Location: [facility] OR;  Service: Orthopedics;  Laterality: [name];   bone spur removed Right    shoulder   BOWEL RESECTION  07/04/2012   Procedure: SMALL BOWEL RESECTION;  Surgeon: Verdia Glad, MD;  Location: [facility] ORS;  Service: Gynecology;;   BREAST BIOPSY     CHOLECYSTECTOMY     COLONOSCOPY [name] 09/10/2023   Procedure: COLONOSCOPY;  Surgeon: Selena Daily, MD;  Location: [facility] ENDOSCOPY;  Service: Gastroenterology;  Laterality: [name];   CORONARY STENT INTERVENTION [name] 04/06/2017   Procedure: CORONARY STENT INTERVENTION;  Surgeon: Wenona Hamilton, MD;  Location: [facility] INVASIVE CV LAB;  Service: Cardiovascular;  Laterality: [name];   DILATION AND CURETTAGE OF UTERUS  1999   ESOPHAGOGASTRODUODENOSCOPY [name] 09/09/2023   Procedure: EGD (ESOPHAGOGASTRODUODENOSCOPY);  Surgeon: Marnee Sink, MD;  Location: [facility] ENDOSCOPY;  Service: Endoscopy;  Laterality: [name];   EYE SURGERY Bilateral    cataracts   FALSE ANEURYSM REPAIR Right 04/26/2017   Procedure: 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;  Surgeon: Arvil Lauber, MD;  Location: [facility] OR;  Service: Vascular;  Laterality: Right;   HAND SURGERY  12/2008   after hand fx/due to fall   JOINT REPLACEMENT  Bilateral 07/1998   knees   KNEE ARTHROPLASTY  05/23/1998   total right   LAPAROTOMY Bilateral 07/04/2012   Procedure: EXPLORATORY LAPAROTOMY TOTAL ABDOMINAL HYSTERECTOMY BILATERAL SALPINGO-OOPHORECTOMY with small bowel resection ;  Surgeon: Verdia Glad, MD;  Location: [facility] ORS;  Service: Gynecology;  Laterality: Bilateral;   LEFT HEART CATH AND CORONARY ANGIOGRAPHY [name] 04/06/2017   Procedure: LEFT HEART CATH AND CORONARY ANGIOGRAPHY;  Surgeon: Mardell Shade, MD;  Location: [facility] INVASIVE CV LAB;  Service: Cardiovascular;  Laterality: [name];   polyp removal  1999   POLYPECTOMY  09/10/2023   Procedure: POLYPECTOMY, INTESTINE;  Surgeon: Selena Daily, MD;  Location: [facility] ENDOSCOPY;  Service: Gastroenterology;;   TEE WITHOUT CARDIOVERSION [name] 01/12/2022   Procedure: TRANSESOPHAGEAL ECHOCARDIOGRAM (TEE);  Surgeon: Gollan, Timothy J, MD;  Location: [facility]  ORS;  Service: Cardiovascular;  Laterality: [name];   TUBAL LIGATION     Social History   Tobacco Use   Smoking status: Never   Smokeless tobacco: Never  Vaping Use   Vaping status: Never Used  Substance Use Topics   Alcohol use: Never    Alcohol/week: 0.0 standard drinks of alcohol   Drug use: Never   Family History  Problem Relation Age of Onset   Heart disease Father    Heart attack Father    Stroke Father    Heart disease Sister        CABG   Stroke Sister    Cancer Sister    Bladder Cancer Sister    Stroke Sister        "massive brain bleed"   Leukemia Brother    Lung cancer Brother    Leukemia Brother    Stomach cancer Brother    Heart disease Brother        CABG   Dementia Neg Hx    Allergies  Allergen Reactions   Allopurinol  Nausea Only   Lipitor [Atorvastatin  Calcium ] Itching and Other (See Comments)    Leg aches and aching all over   Morphine  And Codeine Other (See Comments)    Makes the patient "loopy,"    Simvastatin Other (See Comments)    MUSCLE PAIN   Tape Itching, Rash and Other (See  Comments)    "Cannot use paper tape" --- also causes blisters. Use plastic tape   Current Outpatient Medications on File Prior to Visit  Medication Sig Dispense Refill   Acetaminophen  (TYLENOL  8 HOUR PO) Take 3 tablets by mouth daily.     albuterol  (VENTOLIN  HFA) 108 (90 Base) MCG/ACT inhaler Inhale 2 puffs into the lungs every 4 (four) hours as needed for wheezing or shortness of breath. (Patient not taking: Reported on 09/07/2023) 8 g 2   amLODipine  (NORVASC ) 10 MG tablet Take 1 tablet (10 mg total) by mouth daily. 90 tablet 1   Cholecalciferol (VITAMIN D-3) 25 MCG (1000 UT) CAPS Take 1,000 Units by mouth daily. (Patient not taking: Reported on 09/08/2023)     colchicine  0.6 MG tablet TAKE 1 TABLET BY MOUTH DAILY 90 tablet 1   diclofenac  Sodium (VOLTAREN  ARTHRITIS PAIN) 1 % GEL Apply 4 g topically 4 (four) times daily. (Patient taking differently: Apply 4 g topically as needed.) 100 g 11   donepezil  (ARICEPT ) 10 MG tablet TAKE 1 TABLET BY MOUTH AT BEDTIME 90 tablet 1   DULoxetine  (CYMBALTA ) 30 MG capsule TAKE 1 CAPSULE BY MOUTH DAILY 90 capsule 3   ELIQUIS  2.5 MG TABS tablet TAKE 1 TABLET BY MOUTH TWICE A DAY 180 tablet 3   famotidine  (PEPCID ) 20 MG tablet TAKE 1 TABLET BY MOUTH 2 TIMES A DAY 180 tablet 0   ferrous sulfate  (FEROSUL) 325 (65 FE) MG tablet TAKE 1 TABLET BY MOUTH DAILY (Patient taking differently: Take 325 mg by mouth every other day.) 90 tablet 0   lidocaine  (LIDODERM ) 5 % Place 1 patch onto the skin daily. Remove & Discard patch within 12 hours or as directed by MD 30 patch 11   losartan  (COZAAR ) 50 MG tablet Take 1 tablet (50 mg total) by mouth daily. 90 tablet 0   Magnesium Chloride (MAG-SR PLUS CALCIUM  PO) Take by mouth. Taking 1 tab once for dizziness (Patient not taking: Reported on 09/08/2023)     mupirocin  ointment (BACTROBAN ) 2 % APPLY TWO TIMES A DAY TO AFFECTED AREA 22 g 0  nitroGLYCERIN  (NITROSTAT ) 0.4 MG SL tablet Place 1 tablet (0.4 mg total) under the tongue every 5  (five) minutes x 3 doses as needed for chest pain. (Patient not taking: Reported on 09/08/2023) 25 tablet 12   rosuvastatin  (CRESTOR ) 20 MG tablet TAKE 1 TABLET BY MOUTH AT BEDTIME 90 tablet 3   No current facility-administered medications on file prior to visit.    Review of Systems     Objective:   Physical Exam        Assessment & Plan:   Problem List Items Addressed This Visit   None

## 2023-10-04 NOTE — Progress Notes (Signed)
 [address] [address] [employer]

## 2023-10-18 ENCOUNTER — Ambulatory Visit: Admitting: Internal Medicine

## 2023-10-18 DIAGNOSIS — K289 Gastrojejunal ulcer, unspecified as acute or chronic, without hemorrhage or perforation: Secondary | ICD-10-CM | POA: Diagnosis not present

## 2023-10-18 DIAGNOSIS — K5520 Angiodysplasia of colon without hemorrhage: Secondary | ICD-10-CM | POA: Diagnosis not present

## 2023-10-18 DIAGNOSIS — Z79899 Other long term (current) drug therapy: Secondary | ICD-10-CM | POA: Diagnosis not present

## 2023-10-18 DIAGNOSIS — D500 Iron deficiency anemia secondary to blood loss (chronic): Secondary | ICD-10-CM | POA: Diagnosis not present

## 2023-10-19 ENCOUNTER — Telehealth: Payer: Self-pay

## 2023-10-19 ENCOUNTER — Telehealth: Payer: Self-pay | Admitting: Adult Health

## 2023-10-19 DIAGNOSIS — F01A0 Vascular dementia, mild, without behavioral disturbance, psychotic disturbance, mood disturbance, and anxiety: Secondary | ICD-10-CM

## 2023-10-19 DIAGNOSIS — Z8673 Personal history of transient ischemic attack (TIA), and cerebral infarction without residual deficits: Secondary | ICD-10-CM

## 2023-10-19 NOTE — Telephone Encounter (Signed)
 The referral is in  Thanks for the heads up

## 2023-10-19 NOTE — Telephone Encounter (Signed)
 Left VM requesting Nat to call me back

## 2023-10-19 NOTE — Telephone Encounter (Signed)
 LMVM for Valerie Ramirez that returned call.  We have not seen pt since [id number].  Last MMSE 29. Living with daughter.

## 2023-10-19 NOTE — Telephone Encounter (Signed)
 Call to [facility], no answer. Left message to call back. Route to pod 4 when call is returned.

## 2023-10-19 NOTE — Telephone Encounter (Signed)
 Left VM requesting Nat to call back

## 2023-10-19 NOTE — Telephone Encounter (Signed)
 Talked to Valerie Ramirez and she was just follow up to make sure there was no lapse in care. She verified that pt has been seen recently and that her meds are getting filled on a regular. I did verify that she has seen PCP on a regular and also her specialist on a regular. Valerie Ramirez had questions regarding if pt can stay by herself or needs around the clock care but due to pt seeing neuro regarding her dementia I advised her that she should f/u with them. Valerie Ramirez will f/u with neuro to get clarity on dementia problem but she did ask if PCP can put a referral in for a social work eval. Valerie Ramirez thinks it's a good idea but if she puts one in with DSS there is a long wait and it may delay care. Valerie Ramirez said if PCP thinks of anything else she can call her back on # listed below but she had no other issues or concerns for PCP and all of Valerie Ramirez's questions were answered

## 2023-10-19 NOTE — Telephone Encounter (Signed)
 Copied from CRM [id number]. Topic: General - Call Back - No Documentation >> Oct 19, 2023 12:09 PM Precious C wrote: Reason for CRM: Nat Chang from [facility] called regarding pt.  She requested to speak with the clinic about the patient. I offered to transfer the call or send a note - she preferred that I send a note instead.  She would like a callback from the clinic. Her number is [phone].

## 2023-10-19 NOTE — Addendum Note (Signed)
 Addended by: RANDEEN HARDY A on: 10/19/2023 04:13 PM   Modules accepted: Orders

## 2023-10-19 NOTE — Telephone Encounter (Signed)
 Nat Chang from [facility] called again regarding pt.CB#[phone]

## 2023-10-19 NOTE — Telephone Encounter (Signed)
 Valerie Ramirez with [facility] states pt has had some GI issues and pt's primary MD suggested she call here and speak with MD re: is it ok for pt to be left alone unsupervised 2-3 hours or all day, please call

## 2023-10-20 ENCOUNTER — Ambulatory Visit (INDEPENDENT_AMBULATORY_CARE_PROVIDER_SITE_OTHER): Payer: Self-pay

## 2023-10-20 DIAGNOSIS — Z8673 Personal history of transient ischemic attack (TIA), and cerebral infarction without residual deficits: Secondary | ICD-10-CM | POA: Diagnosis not present

## 2023-10-20 LAB — CUP PACEART REMOTE DEVICE CHECK
Date Time Interrogation Session: 20250703050140
Implantable Pulse Generator Implant Date: 20231116
Pulse Gen Model: 5000
Pulse Gen Serial Number: 511019978

## 2023-10-24 ENCOUNTER — Ambulatory Visit: Payer: Self-pay

## 2023-10-25 ENCOUNTER — Ambulatory Visit: Payer: Self-pay | Admitting: Cardiovascular Disease

## 2023-10-26 ENCOUNTER — Telehealth: Payer: Self-pay

## 2023-10-26 NOTE — Progress Notes (Unsigned)
 Complex Care Management Note Care Guide Note  10/26/2023 Name: Valerie Ramirez MRN: [id number] DOB: Sep 11, 1932   Complex Care Management Outreach Attempts: An unsuccessful telephone outreach was attempted today to offer the patient information about available complex care management services.  Follow Up Plan:  Additional outreach attempts will be made to offer the patient complex care management information and services.   Encounter Outcome:  No Answer  [facility]  [facility], [facility] Health Care Management Assistant Direct Dial: [phone]  Fax: [phone]

## 2023-10-28 ENCOUNTER — Other Ambulatory Visit: Payer: Self-pay | Admitting: Family Medicine

## 2023-10-28 DIAGNOSIS — D509 Iron deficiency anemia, unspecified: Secondary | ICD-10-CM

## 2023-10-28 NOTE — Progress Notes (Unsigned)
 Complex Care Management Note Care Guide Note  10/28/2023 Name: HALINA ASANO MRN: [id number] DOB: 1932/10/24   Complex Care Management Outreach Attempts: A second unsuccessful outreach was attempted today to offer the patient with information about available complex care management services.  Follow Up Plan:  Additional outreach attempts will be made to offer the patient complex care management information and services.   Encounter Outcome:  No Answer  [facility]  [facility], [facility] Health Care Management Assistant Direct Dial: [phone]  Fax: [phone]

## 2023-10-31 NOTE — Telephone Encounter (Signed)
Left VM requesting pt/family to call the office back  

## 2023-10-31 NOTE — Telephone Encounter (Signed)
 At her last GI  visit I think the recommended taking every other day instead of daily Had labs early July and I can see results in care everywhere but cannot see recommendation re: iron  dosing (the document does not open)   Can someone check in with caregiver to see what was recommended re: oral iron  before I refill?  Thanks

## 2023-10-31 NOTE — Progress Notes (Signed)
 Complex Care Management Note Care Guide Note  10/31/2023 Name: Valerie Ramirez MRN: [id number] DOB: [birth year]   Complex Care Management Outreach Attempts: A third unsuccessful outreach was attempted today to offer the patient with information about available complex care management services.  Follow Up Plan:  No further outreach attempts will be made at this time. We have been unable to contact the patient to offer or enroll patient in complex care management services.  Encounter Outcome:  No Answer  [facility]  [facility], [facility] Health Care Management Assistant Direct Dial: [phone]  Fax: [phone]

## 2023-10-31 NOTE — Telephone Encounter (Signed)
 Last filled on 01/10/23 #90 tab/ 0 refills, given pt's recent GI problems will route to PCP for review

## 2023-11-08 NOTE — Progress Notes (Signed)
 [address] [address] [employer]

## 2023-11-17 ENCOUNTER — Telehealth: Payer: Self-pay

## 2023-11-17 ENCOUNTER — Telehealth: Payer: Self-pay | Admitting: Family Medicine

## 2023-11-17 NOTE — Progress Notes (Signed)
 Complex Care Management Note  Care Guide Note 11/17/2023 Name: MENNIE SPILLER MRN: [id number] DOB: 07/30/1932  TANE BIEGLER is a 89 y.o. year old female who sees Tower, Laine LABOR, MD for primary care. I reached out to Sherrilyn JINNY Louder by phone today to offer complex care management services.  Ms. Rhinehart was given information about Complex Care Management services today including:   The Complex Care Management services include support from the care team which includes your Nurse Care Manager, Clinical Social Worker, or Pharmacist.  The Complex Care Management team is here to help remove barriers to the health concerns and goals most important to you. Complex Care Management services are voluntary, and the patient may decline or stop services at any time by request to their care team member.   Complex Care Management Consent Status: Patient agreed to services and verbal consent obtained.   Follow up plan:  Telephone appointment with complex care management team member scheduled for:  11/25/23 at 1:00 p.m.   Encounter Outcome:  Patient Scheduled  [facility]  [facility], [facility] Health Care Management Assistant Direct Dial: [phone]  Fax: [phone]

## 2023-11-17 NOTE — Telephone Encounter (Signed)
 Thanks for the update Let me know if you need me to do anything

## 2023-11-17 NOTE — Telephone Encounter (Signed)
 Reason for CRM:  Patients daughter Darice is calling the social worker back from a missed call, please contact her back st [phone]

## 2023-11-17 NOTE — Telephone Encounter (Signed)
 Copied from CRM [id number]. Topic: General - Other >> Nov 17, 2023 11:31 AM Chiquita SQUIBB wrote: Reason for CRM: Nat from [facility] is calling in to speak to a Child psychotherapist regarding the patient, please contact her back at [phone]
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
## 2023-11-18 NOTE — Telephone Encounter (Signed)
 Left VM letting Nat know appt has been scheduled with Case manager

## 2023-11-21 ENCOUNTER — Ambulatory Visit (INDEPENDENT_AMBULATORY_CARE_PROVIDER_SITE_OTHER): Payer: Self-pay

## 2023-11-21 DIAGNOSIS — Z8673 Personal history of transient ischemic attack (TIA), and cerebral infarction without residual deficits: Secondary | ICD-10-CM | POA: Diagnosis not present

## 2023-11-22 LAB — CUP PACEART REMOTE DEVICE CHECK
Date Time Interrogation Session: 20250803065119
Implantable Pulse Generator Implant Date: 20231116
Pulse Gen Model: 5000
Pulse Gen Serial Number: 511019978

## 2023-11-25 ENCOUNTER — Other Ambulatory Visit: Payer: Self-pay

## 2023-11-25 ENCOUNTER — Other Ambulatory Visit: Payer: Self-pay | Admitting: Family Medicine

## 2023-11-25 NOTE — Patient Outreach (Signed)
 Complex Care Management   Visit Note  11/25/2023  Name:  Valerie Ramirez MRN: [id number] DOB: 03-12-1933  Situation: Referral received for Complex Care Management related to Dementia I obtained verbal consent from Caregiver.  Visit completed with caregiver  on the phone. Caregiver- Darice is requesting assistance to find community resources which could assist with patient doing activities during the day. Background:   Past Medical History:  Diagnosis Date   Arthritis    OA   CAD (coronary artery disease)    stent December 2018   Cancer [facility])    endometrial   GERD (gastroesophageal reflux disease)    Hearing loss    History of kidney stones    Hyperlipidemia    Hypertension    Hypothyroid    not in years   Shortness of breath dyspnea    04/25/17- not anymore    Assessment: Patient Reported Symptoms:  Cognitive Cognitive Status: Requires Assistance Decision Making, Struggling with memory recall, Able to follow simple commands Cognitive/Intellectual Conditions Management [RPT]: None reported or documented in medical history or problem list      Neurological Neurological Review of Symptoms: Other: [place] Neurological Symptoms/Conditions [RPT]: daughter reports that patient has hearing loss Neurological Management Strategies: Medical device  HEENT HEENT Symptoms Reported: Change or loss of hearing (Patient has hearing loss and wear a hearing device) HEENT Management Strategies: Medical device    Cardiovascular Cardiovascular Symptoms Reported: No symptoms reported (Daughter reports that patient has heart stents) Does patient have uncontrolled Hypertension?: No Cardiovascular Management Strategies: Adequate rest  Respiratory Respiratory Symptoms Reported: Shortness of breath Other Respiratory Symptoms: Daughter reports SOB when patient walks Respiratory Management Strategies: Adequate rest  Endocrine Endocrine Symptoms Reported: No symptoms reported Is patient diabetic?: No     Gastrointestinal Gastrointestinal Symptoms Reported: No symptoms reported      Genitourinary Genitourinary Symptoms Reported: Incontinence Additional Genitourinary Details: Daughter reports patient wears depends Genitourinary Management Strategies: Incontinence garment/pad  Integumentary Integumentary Symptoms Reported: Bruising Additional Integumentary Details: Daughter reports that patient bruises easily Skin Management Strategies: Routine screening  Musculoskeletal Musculoskelatal Symptoms Reviewed: No symptoms reported   Falls in the past year?: Yes Number of falls in past year: 2 or more (Pateint hemoglin dropped) Was there an injury with Fall?: No Fall Risk Category Calculator: 2 Patient Fall Risk Level: Moderate Fall Risk Patient at Risk for Falls Due to: Other (Comment) (Patient hemoglobin dropped and one time patient tripped) Fall risk Follow up: Falls evaluation completed  Psychosocial Psychosocial Symptoms Reported: No symptoms reported Additional Psychological Details: Daughter reports that patient seems down at times however she thinks her getting out would help. Patient would not be able to seek treatment.     Quality of Family Relationships: supportive Do you feel physically threatened by others?: No      11/25/2023    1:16 PM  Depression screen PHQ 2/9  Decreased Interest 1  Down, Depressed, Hopeless 0  PHQ - 2 Score 1    There were no vitals filed for this visit.  Medications Reviewed Today     Reviewed by Sherren Olam FORBES KEN (Social Worker) on 11/25/23 at 1314  Med List Status: <None>   Medication Order Taking? Sig Documenting Provider Last Dose Status Informant  Acetaminophen  (TYLENOL  8 HOUR PO) [id number] Yes Take 3 tablets by mouth daily. [provider]  Active Child, Pharmacy Records           Med Note [facility], ALMARIE DELENA Schaumann Sep 08, 2023  6:44  PM) PRN  albuterol  (VENTOLIN  HFA) 108 (90 Base) MCG/ACT inhaler [id number]  Inhale 2 puffs into  the lungs every 4 (four) hours as needed for wheezing or shortness of breath.  Patient not taking: Reported on 09/07/2023   Kasa, Kurian, MD  Active Child, Pharmacy Records  amLODipine  (NORVASC ) 10 MG tablet [id number] Yes Take 1 tablet (10 mg total) by mouth daily. Wouk, Devaughn Sayres, MD  Active   Cholecalciferol (VITAMIN D-3) 25 MCG (1000 UT) CAPS [id number]  Take 1,000 Units by mouth daily.  Patient not taking: Reported on 09/08/2023   [provider]  Active Child, Pharmacy Records  colchicine  0.6 MG tablet [id number] Yes TAKE 1 TABLET BY MOUTH DAILY Tower, Laine LABOR, MD  Active Child, Pharmacy Records  diclofenac  Sodium (VOLTAREN  ARTHRITIS PAIN) 1 % GEL [id number] Yes Apply 4 g topically 4 (four) times daily. Ines Onetha NOVAK, MD  Active Child, Pharmacy Records  donepezil  (ARICEPT ) 10 MG tablet [id number] Yes TAKE 1 TABLET BY MOUTH AT BEDTIME Sherryl Bouchard, NP  Active   DULoxetine  (CYMBALTA ) 30 MG capsule [id number] Yes TAKE 1 CAPSULE BY MOUTH DAILY Tower, Laine LABOR, MD  Active Child, Pharmacy Records  ELIQUIS  2.5 MG TABS tablet [id number] Yes TAKE 1 TABLET BY MOUTH TWICE A DAY Mealor, Augustus E, MD  Active Child, Pharmacy Records  famotidine  (PEPCID ) 20 MG tablet [id number] Yes TAKE 1 TABLET BY MOUTH 2 TIMES A DAY Tower, Laine LABOR, MD  Active Child, Pharmacy Records  ferrous sulfate  (FEROSUL) 325 (65 FE) MG tablet [id number] Yes TAKE 1 TABLET BY MOUTH DAILY Tower, Laine LABOR, MD  Active Child, Pharmacy Records  lidocaine  (LIDODERM ) 5 % [id number] Yes Place 1 patch onto the skin daily. Remove & Discard patch within 12 hours or as directed by MD Ines Onetha NOVAK, MD  Active Child, Pharmacy Records           Med Note [facility], ELIZABETH A   Thu Sep 08, 2023  6:49 PM) PRN  losartan  (COZAAR ) 50 MG tablet [id number] Yes Take 1 tablet (50 mg total) by mouth daily. Tower, Laine LABOR, MD  Active Child, Pharmacy Records  Magnesium Chloride (MAG-SR PLUS CALCIUM  PO) [id number]  Take by mouth. Taking 1 tab once for  dizziness  Patient not taking: Reported on 09/08/2023   [provider]  Active Child, Pharmacy Records  mupirocin  ointment (BACTROBAN ) 2 % [id number] Yes APPLY TWO TIMES A DAY TO AFFECTED AREA Tower, Laine LABOR, MD  Active Child, Pharmacy Records  nitroGLYCERIN  (NITROSTAT ) 0.4 MG SL tablet [id number]  Place 1 tablet (0.4 mg total) under the tongue every 5 (five) minutes x 3 doses as needed for chest pain.  Patient not taking: Reported on 09/08/2023   [name], Shona KANDICE RIGGERS  Active Child, Pharmacy Records           Med Note [facility], [facility] M   Wed Aug 11, 2022  9:36 AM) Not needed in last 61mo  rosuvastatin  (CRESTOR ) 20 MG tablet [id number] Yes TAKE 1 TABLET BY MOUTH AT BEDTIME [place], Peter M, MD  Active Child, Pharmacy Records            Recommendation:   Continue Current Plan of Care  Follow Up Plan:   Telephone follow-up 8/22/202 at 11:00 am  Olam Ally, MSW, LCSW [place]  [facility], [facility] Licensed Clinical Social Worker Direct Dial: [phone]

## 2023-11-25 NOTE — Patient Instructions (Addendum)
 Visit Information  Thank you for taking time to visit with me today. Please don't hesitate to contact me if I can be of assistance to you before our next scheduled appointment.  Our next appointment is by telephone on 12/09/2023 at 11:00 am Please call the care guide team at [phone] if you need to cancel or reschedule your appointment.   Following is a copy of your care plan:   Goals Addressed             This Visit's Progress    VBCI Social Work Care Plan       Problems:   Care Coordination needs related to Dementia: Vascular/Multi-infarct dementia, moderate   CSW Clinical Goal(s):   Over the next 90 days the Caregiver will work with Child psychotherapist to address concerns related to dementia.  Interventions:  Dementia Care:   Current level of care: Home with other family or significant other(s): family member: daughter.  Evaluation of patient safety in current living environment and review of Dementia resources and support (Daughter is requesting assistance with finding patient places that patient could to do activities during the day. ) ADL's Assessed needs, level of care concerns, how currently meeting needs and barriers to care Problem Solving/Task Center strategies reviewed Solution-Focused Strategies employed: Emotional Support Provided Motivational Interviewing employed Care giving stressed acknowledged LCSW encouraged caregiver to practice self care LCSW inquired if caregiver wanted resources such as a support group. Caregiver has declined at this time. LCSW informed caregiver that she would provided with community resources to assist with patient having activities during the day.  Patient Goals/Self-Care Activities:  Coordinate with LCSW to assist with finding options for activities during the day for patient.  Plan:   Telephone follow up appointment with care management team member scheduled for:  12/09/2023 at 11:00 am        Please call 911 if you are  experiencing a Mental Health or Behavioral Health Crisis or need someone to talk to.  Patient verbalizes understanding of instructions and care plan provided today and agrees to view in MyChart. Active MyChart status and patient understanding of how to access instructions and care plan via MyChart confirmed with patient.     Olam Ally, MSW, LCSW [place]  [facility], [facility] Licensed Clinical Social Worker Direct Dial: [phone]    Dementia Caregiver Guide Dementia is a condition that affects the way the brain works. It often affects thinking and memory. A person with dementia may: Forget things. Have trouble talking or responding to your questions. Have trouble paying attention. Have trouble thinking clearly and making good decisions. Get lost or wander away from home or other places. Have big changes in their mood or emotions. They may: Feel very worried, nervous, or depressed. Have angry outbursts. Be suspicious or accuse you of things. Have childlike behavior and language. Taking care of someone with dementia can be a challenge. The tips below can help you care for the person. How to help manage lifestyle changes Dementia usually gets worse slowly over time. In the early stages, people with dementia can stay safe and take care of themselves with some help. In later stages, they need help with daily tasks like getting dressed, grooming, and going to the bathroom. Communicating When the person is talking and seems frustrated, make eye contact and hold the person's hand. Ask questions that can be answered with a yes or no. Use simple words and a calm voice. Only give one direction at  a time. Limit choices for the person. Too many choices can be stressful. Avoid correcting the person in a negative way. If the person can't find the right words, gently try to help. Preventing injury  Keep floors clear. Remove rugs, magazine racks, and floor  lamps. Keep hallways well lit, especially at night. Put a handrail and nonslip mat in the bathtub or shower. Put childproof locks on cabinets that have dangerous items in them. These items include medicine, alcohol, guns, cleaning products, and sharp tools. For doors to the outside, put locks where the person can't see or reach them. This helps keep the person from going out of the house and getting lost. Be ready for emergencies. Keep a list of emergency phone numbers and addresses close by. Remove car keys and lock garage doors so the person doesn't try to drive. Have the person wear a bracelet that tracks where they are and shows that they're a person with memory loss. This should be worn at all times for safety. Helping with daily life  Keep the person on track with their daily routine. Try to identify areas where the person may need help. Be supportive, patient, calm, and encouraging. Gently remind the person that adjusting to changes takes time. Help with the tasks that the person has asked for help with. Keep the person involved in daily tasks and decisions as much as you can. Encourage conversation, but try not to get frustrated if the person struggles to find words or doesn't seem to appreciate your help. Other tips Think about any safety risks and take steps to avoid them. Keep things organized: Organize medicines in a pill box for each day of the week. Keep a calendar in a central place. Use it to remind the person of health care visits or other activities. Create a plan to handle any legal or financial matters. Get help from a professional if needed. Help make sure the person: Takes medicines only as told by their health care providers. Eats regular, healthy meals. They should also drink plenty of fluids. Goes to all scheduled health care appointments. Gets regular sleep. Taking care of yourself Being a caregiver for someone with dementia can be hard. You may feel stressed  and have many other emotions. It's important to also take care of yourself. Here are some tips: Find out about services that can provide short-term care for the person. This is called respite care. It can allow you to take a break when you need one. Find healthy ways to deal with stress. Some ways include: Spending time with other people. Exercising. Meditating or doing deep breathing exercises. Take care of your own health by: Getting enough sleep. Eating healthy foods. Getting regular exercise. Join a support group with others who are caregivers. These groups can help you: Learn other ways to deal with stress. Share experiences with others. Get emotional comfort and support. Learn about caregiving as the disease gets worse. Find resources in your community. Where to find support: Many people and organizations offer support. These include: Support groups for people with dementia. Support groups for caregivers. Counselors or therapists. Home health care services. Adult day care centers. Where to find more information Alzheimer's Association: WesternTunes.it Family Caregiver Alliance: caregiver.org Alzheimer's Foundation of [place]: alzfdn.org Contact a health care provider if: The person's health is quickly getting worse. You're no longer able to care for the person. Caring for the person is affecting your physical and emotional health. You're feeling worried, nervous, or depressed about caring  for the person. Get help right away if: You feel like the person may hurt themselves or others. The person has talked about taking their own life. These symptoms may be an emergency. Take one of these steps right away: Go to your nearest emergency room. Call 911. Call the National Suicide Prevention Lifeline at 949-585-8192 or 988. Text the Crisis Text Line at 612 836 8350. This information is not intended to replace advice given to you by your health care provider. Make sure you discuss any  questions you have with your health care provider. Document Revised: 07/16/2022 Document Reviewed: 07/16/2022 Elsevier Patient Education  2024 [employer].

## 2023-11-27 ENCOUNTER — Other Ambulatory Visit: Payer: Self-pay | Admitting: Family Medicine

## 2023-11-28 ENCOUNTER — Ambulatory Visit: Payer: Self-pay

## 2023-11-28 NOTE — Telephone Encounter (Signed)
 Last OV was 09/07/23  Last refilled on 05/04/23 #90 tab/ 1 refill

## 2023-11-29 ENCOUNTER — Ambulatory Visit: Payer: Self-pay | Admitting: Cardiovascular Disease

## 2023-11-29 ENCOUNTER — Telehealth: Payer: Self-pay | Admitting: Family Medicine

## 2023-11-29 NOTE — Telephone Encounter (Signed)
 Hb was 9.5 at [facility] in july We cannot force her to take the iron   Would she/they be open to an iron  infusion (with hematology) if she was a candidate?

## 2023-11-29 NOTE — Telephone Encounter (Signed)
 Spoke with karen  to schedule awv  she declined to schedule  She wanted to let you know  patient refused to refill iron  meds.   She also stated dr guido stated she needs to take meds.  They have an appointment at [facility] to stop intestinal bleed at end of Month  [facility] is the only one that can do this  Please advise what karen needs to do [phone]

## 2023-11-30 NOTE — Telephone Encounter (Signed)
 Left VM requesting pt's daughter Darice to call back

## 2023-12-01 DIAGNOSIS — H6123 Impacted cerumen, bilateral: Secondary | ICD-10-CM | POA: Diagnosis not present

## 2023-12-01 DIAGNOSIS — H903 Sensorineural hearing loss, bilateral: Secondary | ICD-10-CM | POA: Diagnosis not present

## 2023-12-02 NOTE — Telephone Encounter (Signed)
 Left VM requesting pt/daughter to call the office back

## 2023-12-08 NOTE — Telephone Encounter (Signed)
 Left 3rd VM with daughter but pt does have an appt with new GI doc next week

## 2023-12-09 ENCOUNTER — Other Ambulatory Visit: Payer: Self-pay

## 2023-12-09 NOTE — Patient Instructions (Signed)
 Visit Information  Thank you for taking time to visit with me today. Please don't hesitate to contact me if I can be of assistance to you before our next scheduled appointment.  Your next care management appointment is no further scheduled appointments.    Patient has met all care management goals. Care Management case will be closed. Patient has been provided contact information should new needs arise.   Please call the care guide team at [phone] if you need to cancel, schedule, or reschedule an appointment.   Please call 911 if you are experiencing a Mental Health or Behavioral Health Crisis or need someone to talk to.  Georgine Kitchens, MSW, LCSW [place]  [facility], [facility] Licensed Clinical Social Worker Direct Dial: [phone]

## 2023-12-09 NOTE — Patient Outreach (Signed)
 Complex Care Management   Visit Note  12/09/2023  Name:  Valerie Ramirez MRN: [id number] DOB: [birth year]  Situation: Referral received for Complex Care Management related to Dementia I obtained verbal consent from Caregiver.  Visit completed with caregiver  on the phone. Caregiver- Darice is requesting assistance to find community resources which could assist with patient doing activities during the day. LCSW provided Darice with the resources and Darice states that she can access the senior community center at this time. There are no other needs at this time therefore patient will be dis-enrolled form VBCI services. Background:   Past Medical History:  Diagnosis Date   Arthritis    OA   CAD (coronary artery disease)    stent December 2018   Cancer [facility])    endometrial   GERD (gastroesophageal reflux disease)    Hearing loss    History of kidney stones    Hyperlipidemia    Hypertension    Hypothyroid    not in years   Shortness of breath dyspnea    04/25/17- not anymore    Assessment: Patient Reported Symptoms:  Cognitive Cognitive Status: Requires Assistance Decision Making, Struggling with memory recall, Normal speech and language skills Cognitive/Intellectual Conditions Management [RPT]: None reported or documented in medical history or problem list      Neurological Neurological Review of Symptoms: Not assessed    HEENT HEENT Symptoms Reported: Not assessed      Cardiovascular Cardiovascular Symptoms Reported: Not assessed    Respiratory Respiratory Symptoms Reported: Not assesed    Endocrine Endocrine Symptoms Reported: Not assessed    Gastrointestinal Gastrointestinal Symptoms Reported: Not assessed      Genitourinary Genitourinary Symptoms Reported: Not assessed    Integumentary Integumentary Symptoms Reported: Not assessed    Musculoskeletal Musculoskelatal Symptoms Reviewed: Not assessed        Psychosocial Psychosocial Symptoms Reported: No symptoms  reported          12/09/2023    PHQ2-9 Depression Screening   Little interest or pleasure in doing things    Feeling down, depressed, or hopeless    PHQ-2 - Total Score    Trouble falling or staying asleep, or sleeping too much    Feeling tired or having little energy    Poor appetite or overeating     Feeling bad about yourself - or that you are a failure or have let yourself or your family down    Trouble concentrating on things, such as reading the newspaper or watching television    Moving or speaking so slowly that other people could have noticed.  Or the opposite - being so fidgety or restless that you have been moving around a lot more than usual    Thoughts that you would be better off dead, or hurting yourself in some way    PHQ2-9 Total Score    If you checked off any problems, how difficult have these problems made it for you to do your work, take care of things at home, or get along with other people    Depression Interventions/Treatment      There were no vitals filed for this visit.  Medications Reviewed Today     Reviewed by Sherren Olam FORBES KEN (Social Worker) on 12/09/23 at 1109  Med List Status: <None>   Medication Order Taking? Sig Documenting Provider Last Dose Status Informant  Acetaminophen  (TYLENOL  8 HOUR PO) [id number]  Take 3 tablets by mouth daily. [provider]  Active Child, Pharmacy Records  Med Note [facility], ELIZABETH A   Thu Sep 08, 2023  6:44 PM) PRN  albuterol  (VENTOLIN  HFA) 108 (90 Base) MCG/ACT inhaler [id number] No Inhale 2 puffs into the lungs every 4 (four) hours as needed for wheezing or shortness of breath.  Patient not taking: Reported on 09/07/2023   Kasa, Kurian, MD Not Taking Active Child, Pharmacy Records  amLODipine  (NORVASC ) 10 MG tablet [id number]  Take 1 tablet (10 mg total) by mouth daily. Wouk, Devaughn Sayres, MD  Active   Cholecalciferol (VITAMIN D-3) 25 MCG (1000 UT) CAPS [id number] No Take 1,000 Units by mouth  daily.  Patient not taking: Reported on 09/08/2023   [provider] Not Taking Active Child, Pharmacy Records  colchicine  0.6 MG tablet [id number]  TAKE 1 TABLET BY MOUTH DAILY Tower, Laine LABOR, MD  Active   diclofenac  Sodium (VOLTAREN  ARTHRITIS PAIN) 1 % GEL [id number]  Apply 4 g topically 4 (four) times daily. Ines Onetha NOVAK, MD  Active Child, Pharmacy Records  donepezil  (ARICEPT ) 10 MG tablet [id number]  TAKE 1 TABLET BY MOUTH AT BEDTIME [place], Megan, NP  Active   DULoxetine  (CYMBALTA ) 30 MG capsule [id number]  TAKE 1 CAPSULE BY MOUTH DAILY Tower, Laine LABOR, MD  Active Child, Pharmacy Records  ELIQUIS  2.5 MG TABS tablet [id number]  TAKE 1 TABLET BY MOUTH TWICE A DAY Mealor, Augustus E, MD  Active Child, Pharmacy Records  famotidine  (PEPCID ) 20 MG tablet [id number]  TAKE 1 TABLET BY MOUTH 2 TIMES A DAY Tower, Laine LABOR, MD  Active Child, Pharmacy Records  ferrous sulfate  (FEROSUL) 325 (65 FE) MG tablet [id number]  TAKE 1 TABLET BY MOUTH DAILY Tower, Laine LABOR, MD  Active Child, Pharmacy Records  lidocaine  (LIDODERM ) 5 % [id number]  Place 1 patch onto the skin daily. Remove & Discard patch within 12 hours or as directed by MD Ines Onetha NOVAK, MD  Active Child, Pharmacy Records           Med Note [facility], ELIZABETH A   Thu Sep 08, 2023  6:49 PM) PRN  losartan  (COZAAR ) 50 MG tablet [id number]  Take 1 tablet (50 mg total) by mouth daily. Tower, Laine LABOR, MD  Active Child, Pharmacy Records  Magnesium Chloride (MAG-SR PLUS CALCIUM  PO) [id number] No Take by mouth. Taking 1 tab once for dizziness  Patient not taking: Reported on 09/08/2023   [provider] Not Taking Active Child, Pharmacy Records  mupirocin  ointment (BACTROBAN ) 2 % [id number]  APPLY TWO TIMES A DAY TO AFFECTED AREA Tower, Laine LABOR, MD  Active Child, Pharmacy Records  nitroGLYCERIN  (NITROSTAT ) 0.4 MG SL tablet [id number] No Place 1 tablet (0.4 mg total) under the tongue every 5 (five) minutes x 3 doses as needed for chest pain.   Patient not taking: Reported on 09/08/2023   [name], Shona MATSU, PA-C Not Taking Active Child, Pharmacy Records           Med Note [facility], [facility] M   Wed Aug 11, 2022  9:36 AM) Not needed in last 63mo  rosuvastatin  (CRESTOR ) 20 MG tablet [id number]  TAKE 1 TABLET BY MOUTH AT BEDTIME [place], Peter M, MD  Active Child, Pharmacy Records            Recommendation:   PCP Follow-up  Follow Up Plan:   Patient has met all care management goals. Care Management case will be closed. Patient has been provided contact information should new needs arise.   Olam Ally, MSW, LCSW [place]  [facility], [facility] Licensed Clinical Scientist, clinical (histocompatibility and immunogenetics) Dial: [phone]

## 2023-12-11 ENCOUNTER — Other Ambulatory Visit: Payer: Self-pay | Admitting: Family Medicine

## 2023-12-15 DIAGNOSIS — I440 Atrioventricular block, first degree: Secondary | ICD-10-CM | POA: Diagnosis not present

## 2023-12-15 DIAGNOSIS — Z7901 Long term (current) use of anticoagulants: Secondary | ICD-10-CM | POA: Diagnosis not present

## 2023-12-15 DIAGNOSIS — E559 Vitamin D deficiency, unspecified: Secondary | ICD-10-CM | POA: Diagnosis not present

## 2023-12-15 DIAGNOSIS — R5383 Other fatigue: Secondary | ICD-10-CM | POA: Diagnosis not present

## 2023-12-15 DIAGNOSIS — E538 Deficiency of other specified B group vitamins: Secondary | ICD-10-CM | POA: Diagnosis not present

## 2023-12-15 DIAGNOSIS — I10 Essential (primary) hypertension: Secondary | ICD-10-CM | POA: Diagnosis not present

## 2023-12-15 DIAGNOSIS — N1832 Chronic kidney disease, stage 3b: Secondary | ICD-10-CM | POA: Diagnosis not present

## 2023-12-15 DIAGNOSIS — Z9181 History of falling: Secondary | ICD-10-CM | POA: Diagnosis not present

## 2023-12-15 DIAGNOSIS — F0150 Vascular dementia without behavioral disturbance: Secondary | ICD-10-CM | POA: Diagnosis not present

## 2023-12-15 DIAGNOSIS — Z955 Presence of coronary angioplasty implant and graft: Secondary | ICD-10-CM | POA: Diagnosis not present

## 2023-12-15 DIAGNOSIS — R490 Dysphonia: Secondary | ICD-10-CM | POA: Diagnosis not present

## 2023-12-15 DIAGNOSIS — N189 Chronic kidney disease, unspecified: Secondary | ICD-10-CM | POA: Diagnosis not present

## 2023-12-15 DIAGNOSIS — H9190 Unspecified hearing loss, unspecified ear: Secondary | ICD-10-CM | POA: Diagnosis not present

## 2023-12-15 DIAGNOSIS — D509 Iron deficiency anemia, unspecified: Secondary | ICD-10-CM | POA: Diagnosis not present

## 2023-12-15 DIAGNOSIS — D500 Iron deficiency anemia secondary to blood loss (chronic): Secondary | ICD-10-CM | POA: Diagnosis not present

## 2023-12-15 DIAGNOSIS — I4891 Unspecified atrial fibrillation: Secondary | ICD-10-CM | POA: Diagnosis not present

## 2023-12-15 DIAGNOSIS — Z91048 Other nonmedicinal substance allergy status: Secondary | ICD-10-CM | POA: Diagnosis not present

## 2023-12-15 DIAGNOSIS — I2510 Atherosclerotic heart disease of native coronary artery without angina pectoris: Secondary | ICD-10-CM | POA: Diagnosis not present

## 2023-12-15 DIAGNOSIS — Z66 Do not resuscitate: Secondary | ICD-10-CM | POA: Diagnosis not present

## 2023-12-15 DIAGNOSIS — K289 Gastrojejunal ulcer, unspecified as acute or chronic, without hemorrhage or perforation: Secondary | ICD-10-CM | POA: Diagnosis not present

## 2023-12-15 DIAGNOSIS — K922 Gastrointestinal hemorrhage, unspecified: Secondary | ICD-10-CM | POA: Diagnosis not present

## 2023-12-15 DIAGNOSIS — I129 Hypertensive chronic kidney disease with stage 1 through stage 4 chronic kidney disease, or unspecified chronic kidney disease: Secondary | ICD-10-CM | POA: Diagnosis not present

## 2023-12-15 DIAGNOSIS — Z79899 Other long term (current) drug therapy: Secondary | ICD-10-CM | POA: Diagnosis not present

## 2023-12-15 DIAGNOSIS — D6489 Other specified anemias: Secondary | ICD-10-CM | POA: Diagnosis not present

## 2023-12-15 DIAGNOSIS — R1312 Dysphagia, oropharyngeal phase: Secondary | ICD-10-CM | POA: Diagnosis not present

## 2023-12-15 DIAGNOSIS — N183 Chronic kidney disease, stage 3 unspecified: Secondary | ICD-10-CM | POA: Diagnosis not present

## 2023-12-15 DIAGNOSIS — N179 Acute kidney failure, unspecified: Secondary | ICD-10-CM | POA: Diagnosis not present

## 2023-12-15 DIAGNOSIS — K5520 Angiodysplasia of colon without hemorrhage: Secondary | ICD-10-CM | POA: Diagnosis not present

## 2023-12-15 DIAGNOSIS — Z8542 Personal history of malignant neoplasm of other parts of uterus: Secondary | ICD-10-CM | POA: Diagnosis not present

## 2023-12-15 DIAGNOSIS — R4182 Altered mental status, unspecified: Secondary | ICD-10-CM | POA: Diagnosis not present

## 2023-12-15 DIAGNOSIS — Z885 Allergy status to narcotic agent status: Secondary | ICD-10-CM | POA: Diagnosis not present

## 2023-12-15 DIAGNOSIS — Z8673 Personal history of transient ischemic attack (TIA), and cerebral infarction without residual deficits: Secondary | ICD-10-CM | POA: Diagnosis not present

## 2023-12-16 DIAGNOSIS — D500 Iron deficiency anemia secondary to blood loss (chronic): Secondary | ICD-10-CM | POA: Diagnosis not present

## 2023-12-16 DIAGNOSIS — K289 Gastrojejunal ulcer, unspecified as acute or chronic, without hemorrhage or perforation: Secondary | ICD-10-CM | POA: Diagnosis not present

## 2023-12-16 DIAGNOSIS — N179 Acute kidney failure, unspecified: Secondary | ICD-10-CM | POA: Diagnosis not present

## 2023-12-16 DIAGNOSIS — N183 Chronic kidney disease, stage 3 unspecified: Secondary | ICD-10-CM | POA: Diagnosis not present

## 2023-12-16 DIAGNOSIS — K921 Melena: Secondary | ICD-10-CM | POA: Diagnosis not present

## 2023-12-16 DIAGNOSIS — N189 Chronic kidney disease, unspecified: Secondary | ICD-10-CM | POA: Diagnosis not present

## 2023-12-22 ENCOUNTER — Ambulatory Visit (INDEPENDENT_AMBULATORY_CARE_PROVIDER_SITE_OTHER): Payer: Self-pay

## 2023-12-22 DIAGNOSIS — Z9181 History of falling: Secondary | ICD-10-CM | POA: Diagnosis not present

## 2023-12-22 DIAGNOSIS — M6281 Muscle weakness (generalized): Secondary | ICD-10-CM | POA: Diagnosis not present

## 2023-12-22 DIAGNOSIS — H9190 Unspecified hearing loss, unspecified ear: Secondary | ICD-10-CM | POA: Diagnosis not present

## 2023-12-22 DIAGNOSIS — D500 Iron deficiency anemia secondary to blood loss (chronic): Secondary | ICD-10-CM | POA: Diagnosis not present

## 2023-12-22 DIAGNOSIS — Z8673 Personal history of transient ischemic attack (TIA), and cerebral infarction without residual deficits: Secondary | ICD-10-CM | POA: Diagnosis not present

## 2023-12-22 DIAGNOSIS — E039 Hypothyroidism, unspecified: Secondary | ICD-10-CM | POA: Diagnosis not present

## 2023-12-22 DIAGNOSIS — R1312 Dysphagia, oropharyngeal phase: Secondary | ICD-10-CM | POA: Diagnosis not present

## 2023-12-22 DIAGNOSIS — Z955 Presence of coronary angioplasty implant and graft: Secondary | ICD-10-CM | POA: Diagnosis not present

## 2023-12-22 DIAGNOSIS — C541 Malignant neoplasm of endometrium: Secondary | ICD-10-CM | POA: Diagnosis not present

## 2023-12-22 DIAGNOSIS — F0390 Unspecified dementia without behavioral disturbance: Secondary | ICD-10-CM | POA: Diagnosis not present

## 2023-12-22 DIAGNOSIS — I2510 Atherosclerotic heart disease of native coronary artery without angina pectoris: Secondary | ICD-10-CM | POA: Diagnosis not present

## 2023-12-22 DIAGNOSIS — K633 Ulcer of intestine: Secondary | ICD-10-CM | POA: Diagnosis not present

## 2023-12-22 DIAGNOSIS — D630 Anemia in neoplastic disease: Secondary | ICD-10-CM | POA: Diagnosis not present

## 2023-12-22 DIAGNOSIS — I480 Paroxysmal atrial fibrillation: Secondary | ICD-10-CM

## 2023-12-22 DIAGNOSIS — N179 Acute kidney failure, unspecified: Secondary | ICD-10-CM | POA: Diagnosis not present

## 2023-12-22 DIAGNOSIS — I4891 Unspecified atrial fibrillation: Secondary | ICD-10-CM | POA: Diagnosis not present

## 2023-12-22 LAB — CUP PACEART REMOTE DEVICE CHECK
Date Time Interrogation Session: 20250903080454
Implantable Pulse Generator Implant Date: 20231116
Pulse Gen Model: 5000
Pulse Gen Serial Number: 511019978

## 2023-12-23 ENCOUNTER — Ambulatory Visit: Payer: Self-pay | Admitting: Cardiovascular Disease

## 2023-12-23 ENCOUNTER — Telehealth: Payer: Self-pay | Admitting: *Deleted

## 2023-12-23 ENCOUNTER — Ambulatory Visit (INDEPENDENT_AMBULATORY_CARE_PROVIDER_SITE_OTHER): Admitting: Family Medicine

## 2023-12-23 ENCOUNTER — Encounter: Payer: Self-pay | Admitting: Family Medicine

## 2023-12-23 VITALS — BP 136/86 | HR 76 | Temp 98.1°F | Ht 62.0 in | Wt 183.2 lb

## 2023-12-23 DIAGNOSIS — D509 Iron deficiency anemia, unspecified: Secondary | ICD-10-CM

## 2023-12-23 DIAGNOSIS — Z23 Encounter for immunization: Secondary | ICD-10-CM | POA: Diagnosis not present

## 2023-12-23 DIAGNOSIS — N1832 Chronic kidney disease, stage 3b: Secondary | ICD-10-CM

## 2023-12-23 DIAGNOSIS — K922 Gastrointestinal hemorrhage, unspecified: Secondary | ICD-10-CM | POA: Diagnosis not present

## 2023-12-23 DIAGNOSIS — I10 Essential (primary) hypertension: Secondary | ICD-10-CM | POA: Diagnosis not present

## 2023-12-23 LAB — IRON: Iron: 127 ug/dL (ref 42–145)

## 2023-12-23 LAB — BASIC METABOLIC PANEL WITH GFR
BUN: 27 mg/dL — ABNORMAL HIGH (ref 6–23)
CO2: 25 meq/L (ref 19–32)
Calcium: 9.2 mg/dL (ref 8.4–10.5)
Chloride: 104 meq/L (ref 96–112)
Creatinine, Ser: 2.02 mg/dL — ABNORMAL HIGH (ref 0.40–1.20)
GFR: 21.5 mL/min — ABNORMAL LOW (ref >60.00)
Glucose, Bld: 130 mg/dL — ABNORMAL HIGH (ref 70–99)
Potassium: 4.7 meq/L (ref 3.5–5.1)
Sodium: 139 meq/L (ref 135–145)

## 2023-12-23 LAB — CBC WITH DIFFERENTIAL/PLATELET
Basophils Absolute: 0.1 K/uL (ref 0.0–0.1)
Basophils Relative: 0.9 % (ref 0.0–3.0)
Eosinophils Absolute: 0.3 K/uL (ref 0.0–0.7)
Eosinophils Relative: 3.8 % (ref 0.0–5.0)
HCT: 31.9 % — ABNORMAL LOW (ref 36.0–46.0)
Hemoglobin: 10.1 g/dL — ABNORMAL LOW (ref 12.0–15.0)
Lymphocytes Relative: 21.1 % (ref 12.0–46.0)
Lymphs Abs: 1.5 K/uL (ref 0.7–4.0)
MCHC: 31.5 g/dL (ref 30.0–36.0)
MCV: 83.7 fl (ref 78.0–100.0)
Monocytes Absolute: 0.7 K/uL (ref 0.1–1.0)
Monocytes Relative: 9.4 % (ref 3.0–12.0)
Neutro Abs: 4.7 K/uL (ref 1.4–7.7)
Neutrophils Relative %: 64.8 % (ref 43.0–77.0)
Platelets: 178 K/uL (ref 150.0–400.0)
RBC: 3.81 Mil/uL — ABNORMAL LOW (ref 3.87–5.11)
RDW: 23.6 % — ABNORMAL HIGH (ref 11.5–15.5)
WBC: 7.2 K/uL (ref 4.0–10.5)

## 2023-12-23 LAB — FERRITIN: Ferritin: 261.7 ng/mL (ref 10.0–291.0)

## 2023-12-23 MED ORDER — FERROUS SULFATE 325 (65 FE) MG PO TABS: 325.0000 mg | ORAL_TABLET | Freq: Every day | ORAL | 0 refills | Status: DC

## 2023-12-23 NOTE — Assessment & Plan Note (Signed)
 Slow small GI bleed/ unlikely not treatable given location and age  Holding eliquis  (this will help)- pt's family will review stroke risk from a fib and review pros/cons   Trying to keep up with anemia Recent hosp with transfusions and iron  infusion  Reviewed hospital records, lab results and studies in detail    Referral made to hematology for help with anemia /iron  management  Refilled ferrous sulfate  325 mg daily

## 2023-12-23 NOTE — Progress Notes (Signed)
 Subjective:    Patient ID: Valerie Ramirez, female    DOB: May 27, 1932, 89 y.o.   MRN: [id number]  HPI  Wt Readings from Last 3 Encounters:  12/23/23 183 lb 4 oz (83.1 kg)  09/08/23 177 lb 7.5 oz (80.5 kg)  09/07/23 185 lb 4 oz (84 kg)   33.52 kg/m  Vitals:   12/23/23 1147  BP: 136/86  Pulse: 76  Temp: 98.1 F (36.7 C)  SpO2: 99%    Pt presents for follow up of Iron  deficiency HTN    Anemia Originally hb was 6.3)  EGD/colonoscopy unrevealing  Capsule endoscopy noted ulcer at anastomosis of prior small bowel resection and small bowel AVM  Was admitted to hospital/ [facility] on 8/28  Gave 2 blood transfusions One iron  infusion   Feeling much better  Is back to previous energy level -doing more than she previously  PT is coming in home  Was taken off eliquis  due to the bleeding (off of it this week)  Difficult decision     Oral iron   ? IV iron  option   12/16/23 [facility] Hb 8.2  after treatment / was 6.0 Retic ct 81.2  Cr 1.7 GFR 28   Vit D 15   Was told to cut colchicine  in 1/2 Statin in 1/2    Lab Results  Component Value Date   WBC 19.1 (H) 09/11/2023   HGB 9.3 (L) 09/11/2023   HCT 30.2 (L) 09/11/2023   MCV 81.0 09/11/2023   PLT 220 09/11/2023   Lab Results  Component Value Date   IRON  17 (L) 09/08/2023   TIBC 372 09/08/2023   FERRITIN 17 09/08/2023    Now  Taking ferosul 325 mg  Tolerates well -ran out and taking her daughter's   No longer taking eliquis    HTN bp is stable today  No cp or palpitations or headaches or edema  No side effects to medicines  BP Readings from Last 3 Encounters:  12/23/23 136/86  09/11/23 (!) 171/61  09/07/23 (!) 156/92    Last visit increased losartan  from 25 to 50 mg daily Amlodipine  10 mg daily   Lab Results  Component Value Date   NA 140 09/11/2023   K 3.6 09/11/2023   CO2 24 09/11/2023   GLUCOSE 146 (H) 09/11/2023   BUN 17 09/11/2023   CREATININE 1.62 (H) 09/11/2023   CALCIUM  8.8 (L)  09/11/2023   GFR 23.19 (L) 09/07/2023   EGFR 29 (L) 02/09/2021   GFRNONAA 30 (L) 09/11/2023   History of AKI/ ckd        Patient Active Problem List   Diagnosis Date Noted   Angiodysplasia of small intestine, except duodenum without bleeding 09/11/2023   Chronic anastomotic ulcer 09/11/2023   Paroxysmal atrial fibrillation (HCC) 09/09/2023   Iron  deficiency anemia 09/09/2023   GI bleed 09/08/2023   Poor balance 09/07/2023   Chronic cough 12/17/2022   Dysphagia 12/17/2022   History of atrial fibrillation 03/17/2022   Coronary artery disease of native artery of native heart with stable angina pectoris (HCC) 02/01/2022   Vascular dementia (HCC) 01/10/2022   Dyslipidemia 01/09/2022   Mild cognitive impairment 01/06/2022   Fall 06/24/2021   Syncope and collapse 06/24/2021   Acute pain of both knees 06/24/2021   Light headedness 06/24/2021   Bilateral impacted cerumen 06/24/2021   Heme positive stool 02/24/2021   History of CVA (cerebrovascular accident) 02/14/2020   Headache 02/14/2020   Dizziness 12/26/2017   Depression 10/26/2017   H/O subdural  hemorrhage 09/18/2017   CAD S/P percutaneous coronary angioplasty 09/18/2017   Epistaxis    Elevated transaminase level 08/03/2017   Radial artery injury, left, sequela 06/15/2017   H/O: CVA (cerebrovascular accident) 05/26/2017   Sick sinus syndrome (HCC) 05/25/2017   Chronic kidney disease, stage 3b (HCC) 04/05/2017   Anemia 04/05/2017   Thoracic back pain 09/15/2016   History of radius fracture 06/01/2016   AKI (acute kidney injury) (HCC) 04/25/2016   Leukocytosis 04/25/2016   Status post cervical spinal fusion 08/18/2015   Acute pain of right shoulder 05/16/2015   Osteoarthritis, hip, bilateral 08/12/2014   Hearing loss in right ear 06/12/2014   Colon cancer screening 08/10/2013   Seborrheic keratoses, inflamed 08/10/2013   History of fall 04/10/2013   Encounter for Medicare annual wellness exam 08/08/2012   Gout  07/14/2012   History of endometrial cancer 05/25/2012   Degenerative joint disease of cervical spine 05/03/2011   Other screening mammogram 03/31/2011   Prediabetes 09/26/2007   Obesity (BMI 30-39.9) 07/12/2007   Essential hypertension 07/11/2007   History of cardiomyopathy 07/11/2007   Osteoarthritis 07/11/2007   MIXED INCONTINENCE URGE AND STRESS 07/11/2007   Hypothyroidism 10/05/2006   HYPERCHOLESTEROLEMIA, PURE 10/05/2006   Past Medical History:  Diagnosis Date   Arthritis    OA   CAD (coronary artery disease)    stent December 2018   Cancer [facility])    endometrial   GERD (gastroesophageal reflux disease)    Hearing loss    History of kidney stones    Hyperlipidemia    Hypertension    Hypothyroid    not in years   Shortness of breath dyspnea    04/25/17- not anymore   Past Surgical History:  Procedure Laterality Date   ABDOMINAL HYSTERECTOMY     ANTERIOR CERVICAL DECOMP/DISCECTOMY FUSION [name] 08/18/2015   Procedure: C5-6, C6-7 Anterior Cervical Discectomy and Fusion, Allograft, Plate;  Surgeon: Oneil JAYSON Herald, MD;  Location: [facility] OR;  Service: Orthopedics;  Laterality: [name];   bone spur removed Right    shoulder   BOWEL RESECTION  07/04/2012   Procedure: SMALL BOWEL RESECTION;  Surgeon: Toribio LITTIE Percy, MD;  Location: [facility] ORS;  Service: Gynecology;;   BREAST BIOPSY     CHOLECYSTECTOMY     COLONOSCOPY [name] 09/10/2023   Procedure: COLONOSCOPY;  Surgeon: Unk Corinn Skiff, MD;  Location: [facility] ENDOSCOPY;  Service: Gastroenterology;  Laterality: [name];   CORONARY STENT INTERVENTION [name] 04/06/2017   Procedure: CORONARY STENT INTERVENTION;  Surgeon: Darron Deatrice LABOR, MD;  Location: [facility] INVASIVE CV LAB;  Service: Cardiovascular;  Laterality: [name];   DILATION AND CURETTAGE OF UTERUS  1999   ESOPHAGOGASTRODUODENOSCOPY [name] 09/09/2023   Procedure: EGD (ESOPHAGOGASTRODUODENOSCOPY);  Surgeon: Jinny Carmine, MD;  Location[facility]OSCOPY;  Service: Endoscopy;  Laterality: [name];   EYE  SURGERY Bilateral    cataracts   FALSE ANEURYSM REPAIR Right 04/26/2017   Procedure: 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;  Surgeon: Laurence Redell LITTIE, MD;  Location: [facility] OR;  Service: Vascular;  Laterality: Right;   HAND SURGERY  12/2008   after hand fx/due to fall   JOINT REPLACEMENT Bilateral 07/1998   knees   KNEE ARTHROPLASTY  05/23/1998   total right   LAPAROTOMY Bilateral 07/04/2012   Procedure: EXPLORATORY LAPAROTOMY TOTAL ABDOMINAL HYSTERECTOMY BILATERAL SALPINGO-OOPHORECTOMY with small bowel resection ;  Surgeon: Toribio LITTIE Percy, MD;  Location: [facility] ORS;  Service: Gynecology;  Laterality: Bilateral;   LEFT HEART CATH AND CORONARY ANGIOGRAPHY [name] 04/06/2017   Procedure:  LEFT HEART CATH AND CORONARY ANGIOGRAPHY;  Surgeon: Cherrie Toribio SAUNDERS, MD;  Location: [facility] INVASIVE CV LAB;  Service: Cardiovascular;  Laterality: [name];   polyp removal  1999   POLYPECTOMY  09/10/2023   Procedure: POLYPECTOMY, INTESTINE;  Surgeon: Unk Corinn Skiff, MD;  Location: [facility] ENDOSCOPY;  Service: Gastroenterology;;   TEE WITHOUT CARDIOVERSION [name] 01/12/2022   Procedure: TRANSESOPHAGEAL ECHOCARDIOGRAM (TEE);  Surgeon: Perla Evalene PARAS, MD;  Location: [facility] ORS;  Service: Cardiovascular;  Laterality: [name];   TUBAL LIGATION     Social History   Tobacco Use   Smoking status: Never   Smokeless tobacco: Never  Vaping Use   Vaping status: Never Used  Substance Use Topics   Alcohol use: Never    Alcohol/week: 0.0 standard drinks of alcohol   Drug use: Never   Family History  Problem Relation Age of Onset   Heart disease Father    Heart attack Father    Stroke Father    Heart disease Sister        CABG   Stroke Sister    Cancer Sister    Bladder Cancer Sister    Stroke Sister        massive brain bleed   Leukemia Brother    Lung cancer Brother    Leukemia Brother    Stomach cancer Brother    Heart disease Brother        CABG   Dementia Neg Hx    Allergies  Allergen Reactions    Allopurinol  Nausea Only   Lipitor [Atorvastatin  Calcium ] Itching and Other (See Comments)    Leg aches and aching all over   Morphine  And Codeine Other (See Comments)    Makes the patient loopy,    Simvastatin Other (See Comments)    MUSCLE PAIN   Tape Itching, Rash and Other (See Comments)    Cannot use paper tape --- also causes blisters. Use plastic tape   Current Outpatient Medications on File Prior to Visit  Medication Sig Dispense Refill   Acetaminophen  (TYLENOL  8 HOUR PO) Take 3 tablets by mouth daily.     amLODipine  (NORVASC ) 10 MG tablet Take 1 tablet (10 mg total) by mouth daily. 90 tablet 1   Cholecalciferol (VITAMIN D-3) 25 MCG (1000 UT) CAPS Take 1,000 Units by mouth daily.     colchicine  0.6 MG tablet TAKE 1 TABLET BY MOUTH DAILY (Patient taking differently: Take 0.3 mg by mouth daily.) 90 tablet 0   diclofenac  Sodium (VOLTAREN  ARTHRITIS PAIN) 1 % GEL Apply 4 g topically 4 (four) times daily. 100 g 11   donepezil  (ARICEPT ) 10 MG tablet TAKE 1 TABLET BY MOUTH AT BEDTIME 90 tablet 1   DULoxetine  (CYMBALTA ) 30 MG capsule TAKE 1 CAPSULE BY MOUTH DAILY 90 capsule 3   famotidine  (PEPCID ) 20 MG tablet TAKE 1 TABLET BY MOUTH 2 TIMES A DAY 180 tablet 0   lidocaine  (LIDODERM ) 5 % Place 1 patch onto the skin daily. Remove & Discard patch within 12 hours or as directed by MD 30 patch 11   losartan  (COZAAR ) 50 MG tablet TAKE 1 TABLET BY MOUTH DAILY 90 tablet 0   Magnesium Chloride (MAG-SR PLUS CALCIUM  PO) Take by mouth. Taking 1 tab once for dizziness     mupirocin  ointment (BACTROBAN ) 2 % APPLY TWO TIMES A DAY TO AFFECTED AREA 22 g 0   nitroGLYCERIN  (NITROSTAT ) 0.4 MG SL tablet Place 1 tablet (0.4 mg total) under the tongue every 5 (five) minutes x 3 doses as  needed for chest pain. 25 tablet 12   rosuvastatin  (CRESTOR ) 20 MG tablet TAKE 1 TABLET BY MOUTH AT BEDTIME (Patient taking differently: Take 10 mg by mouth at bedtime.) 90 tablet 3   ELIQUIS  2.5 MG TABS tablet TAKE 1 TABLET BY  MOUTH TWICE A DAY (Patient not taking: Reported on 12/23/2023) 180 tablet 3   No current facility-administered medications on file prior to visit.    Review of Systems  Constitutional:  Positive for appetite change. Negative for activity change, fatigue, fever and unexpected weight change.       Energy level is much better   HENT:  Negative for congestion, ear pain, rhinorrhea, sinus pressure and sore throat.   Eyes:  Negative for pain, redness and visual disturbance.  Respiratory:  Negative for cough, shortness of breath and wheezing.   Cardiovascular:  Negative for chest pain and palpitations.  Gastrointestinal:  Negative for abdominal pain, blood in stool, constipation and diarrhea.  Endocrine: Negative for polydipsia and polyuria.  Genitourinary:  Negative for dysuria, frequency and urgency.  Musculoskeletal:  Negative for arthralgias, back pain and myalgias.  Skin:  Negative for pallor and rash.  Allergic/Immunologic: Negative for environmental allergies.  Neurological:  Negative for dizziness, syncope and headaches.  Hematological:  Negative for adenopathy. Does not bruise/bleed easily.  Psychiatric/Behavioral:  Negative for decreased concentration and dysphoric mood. The patient is not nervous/anxious.        Objective:   Physical Exam Constitutional:      General: She is not in acute distress.    Appearance: Normal appearance. She is well-developed. She is obese. She is not ill-appearing or diaphoretic.  HENT:     Head: Normocephalic and atraumatic.  Eyes:     Conjunctiva/sclera: Conjunctivae normal.     Pupils: Pupils are equal, round, and reactive to light.  Neck:     Thyroid : No thyromegaly.     Vascular: No carotid bruit or JVD.  Cardiovascular:     Rate and Rhythm: Normal rate and regular rhythm.     Heart sounds: Normal heart sounds.     No gallop.  Pulmonary:     Effort: Pulmonary effort is normal. No respiratory distress.     Breath sounds: Normal breath  sounds. No wheezing or rales.  Abdominal:     General: There is no distension or abdominal bruit.     Palpations: Abdomen is soft.  Musculoskeletal:     Cervical back: Normal range of motion and neck supple.     Right lower leg: No edema.     Left lower leg: No edema.  Lymphadenopathy:     Cervical: No cervical adenopathy.  Skin:    General: Skin is warm and dry.     Coloration: Skin is not jaundiced or pale.     Findings: No rash.  Neurological:     Mental Status: She is alert.     Cranial Nerves: No cranial nerve deficit.     Motor: No weakness.     Coordination: Coordination normal.     Deep Tendon Reflexes: Reflexes are normal and symmetric. Reflexes normal.  Psychiatric:        Mood and Affect: Mood normal.     Comments: Baseline  Content / good mood  Answers question appropriately             Assessment & Plan:   Problem List Items Addressed This Visit       Cardiovascular and Mediastinum   Essential hypertension   bp  in fair control at this time  BP Readings from Last 1 Encounters:  12/23/23 136/86   No changes needed Most recent labs reviewed  Disc lifstyle change with low sodium diet and exercise  Blood pressure improved with increase in losartan  from 25 to 50 mg daily  Amlodipine  10 mg daily   Bmet today Most likely will refer to nephrology      Relevant Orders   Basic metabolic panel with GFR     Digestive   GI bleed   Slow small GI bleed/ unlikely not treatable given location and age  Holding eliquis  (this will help)- pt's family will review stroke risk from a fib and review pros/cons   Trying to keep up with anemia Recent hosp with transfusions and iron  infusion  Reviewed hospital records, lab results and studies in detail    Referral made to hematology for help with anemia /iron  management  Refilled ferrous sulfate  325 mg daily        Genitourinary   Chronic kidney disease, stage 3b (HCC)   Bmet today  Will likely re refer to  nephrology   Encouraged good fluid intake Avoid nsaids   Colchicine  dose was reducted       Relevant Orders   Basic metabolic panel with GFR     Other   Iron  deficiency anemia - Primary   From slow small bowel bleed-most likely not treatable  Recent hosp for 2 transfusions and one iron  inf Reviewed hospital records, lab results and studies in detail  Feeling much better  Cbc and iron  labs today Ref to heme-discuss IV iron    Also considering nephrology ref as ckd is also likely worsening anemia       Relevant Medications   ferrous sulfate  (FEROSUL) 325 (65 FE) MG tablet   Other Relevant Orders   CBC with Differential/Platelet   Ferritin   Iron    Basic metabolic panel with GFR   Ambulatory referral to Hematology / Oncology   Other Visit Diagnoses       Need for influenza vaccination       Relevant Orders   Flu vaccine HIGH DOSE PF(Fluzone Trivalent) (Completed)

## 2023-12-23 NOTE — Assessment & Plan Note (Signed)
 Bmet today  Will likely re refer to nephrology   Encouraged good fluid intake Avoid nsaids   Colchicine  dose was reducted

## 2023-12-23 NOTE — Telephone Encounter (Signed)
 Copied from CRM [id number]. Topic: Clinical - Home Health Verbal Orders >> Dec 23, 2023  2:47 PM Donna BRAVO wrote: Caller/Agency: [facility] Callback Number: [phone] Service Requested: Physical Therapy Frequency: 1 time a week for 5 weeks Any new concerns about the patient? No

## 2023-12-23 NOTE — Telephone Encounter (Signed)
VO given to Valerie Ramirez 

## 2023-12-23 NOTE — Patient Instructions (Addendum)
 Reach out to cardiology re: stroke risk in the setting of no eliqus due to GI bleed  Continue to hold eliquis  until you talk to cardiologist   Today  Cbc / iron   Also kidney function   I put the referral in for hematology for anemia  Please let us  know if you don't hear in 1-2 weeks to set that up (mychart message or call or letter)   I will hold off on kidney referral till we get labs back   I sent in iron  to start taking again

## 2023-12-23 NOTE — Telephone Encounter (Signed)
Please ok that verbal order Thanks  

## 2023-12-23 NOTE — Assessment & Plan Note (Signed)
 From slow small bowel bleed-most likely not treatable  Recent hosp for 2 transfusions and one iron  inf Reviewed hospital records, lab results and studies in detail  Feeling much better  Cbc and iron  labs today Ref to heme-discuss IV iron    Also considering nephrology ref as ckd is also likely worsening anemia

## 2023-12-23 NOTE — Assessment & Plan Note (Signed)
 bp in fair control at this time  BP Readings from Last 1 Encounters:  12/23/23 136/86   No changes needed Most recent labs reviewed  Disc lifstyle change with low sodium diet and exercise  Blood pressure improved with increase in losartan  from 25 to 50 mg daily  Amlodipine  10 mg daily   Bmet today Most likely will refer to nephrology

## 2023-12-25 ENCOUNTER — Ambulatory Visit: Payer: Self-pay | Admitting: Family Medicine

## 2023-12-25 DIAGNOSIS — I10 Essential (primary) hypertension: Secondary | ICD-10-CM

## 2023-12-25 DIAGNOSIS — N1832 Chronic kidney disease, stage 3b: Secondary | ICD-10-CM

## 2023-12-27 DIAGNOSIS — D630 Anemia in neoplastic disease: Secondary | ICD-10-CM | POA: Diagnosis not present

## 2023-12-27 DIAGNOSIS — E039 Hypothyroidism, unspecified: Secondary | ICD-10-CM | POA: Diagnosis not present

## 2023-12-27 DIAGNOSIS — Z955 Presence of coronary angioplasty implant and graft: Secondary | ICD-10-CM | POA: Diagnosis not present

## 2023-12-27 DIAGNOSIS — I2510 Atherosclerotic heart disease of native coronary artery without angina pectoris: Secondary | ICD-10-CM | POA: Diagnosis not present

## 2023-12-27 DIAGNOSIS — Z8673 Personal history of transient ischemic attack (TIA), and cerebral infarction without residual deficits: Secondary | ICD-10-CM | POA: Diagnosis not present

## 2023-12-27 DIAGNOSIS — R1312 Dysphagia, oropharyngeal phase: Secondary | ICD-10-CM | POA: Diagnosis not present

## 2023-12-27 DIAGNOSIS — H9190 Unspecified hearing loss, unspecified ear: Secondary | ICD-10-CM | POA: Diagnosis not present

## 2023-12-27 DIAGNOSIS — M6281 Muscle weakness (generalized): Secondary | ICD-10-CM | POA: Diagnosis not present

## 2023-12-27 DIAGNOSIS — I4891 Unspecified atrial fibrillation: Secondary | ICD-10-CM | POA: Diagnosis not present

## 2023-12-27 DIAGNOSIS — N179 Acute kidney failure, unspecified: Secondary | ICD-10-CM | POA: Diagnosis not present

## 2023-12-27 DIAGNOSIS — K633 Ulcer of intestine: Secondary | ICD-10-CM | POA: Diagnosis not present

## 2023-12-27 DIAGNOSIS — D500 Iron deficiency anemia secondary to blood loss (chronic): Secondary | ICD-10-CM | POA: Diagnosis not present

## 2023-12-27 DIAGNOSIS — F0390 Unspecified dementia without behavioral disturbance: Secondary | ICD-10-CM | POA: Diagnosis not present

## 2023-12-27 DIAGNOSIS — Z9181 History of falling: Secondary | ICD-10-CM | POA: Diagnosis not present

## 2023-12-27 DIAGNOSIS — C541 Malignant neoplasm of endometrium: Secondary | ICD-10-CM | POA: Diagnosis not present

## 2023-12-28 ENCOUNTER — Inpatient Hospital Stay: Attending: Hematology and Oncology | Admitting: Hematology and Oncology

## 2023-12-28 ENCOUNTER — Inpatient Hospital Stay

## 2023-12-28 ENCOUNTER — Encounter: Payer: Self-pay | Admitting: Hematology and Oncology

## 2023-12-28 VITALS — BP 180/69 | HR 88 | Temp 98.1°F | Resp 18 | Wt 181.4 lb

## 2023-12-28 DIAGNOSIS — K254 Chronic or unspecified gastric ulcer with hemorrhage: Secondary | ICD-10-CM | POA: Insufficient documentation

## 2023-12-28 DIAGNOSIS — E039 Hypothyroidism, unspecified: Secondary | ICD-10-CM | POA: Diagnosis not present

## 2023-12-28 DIAGNOSIS — I10 Essential (primary) hypertension: Secondary | ICD-10-CM | POA: Insufficient documentation

## 2023-12-28 DIAGNOSIS — I4891 Unspecified atrial fibrillation: Secondary | ICD-10-CM | POA: Insufficient documentation

## 2023-12-28 DIAGNOSIS — D500 Iron deficiency anemia secondary to blood loss (chronic): Secondary | ICD-10-CM | POA: Insufficient documentation

## 2023-12-28 DIAGNOSIS — I2510 Atherosclerotic heart disease of native coronary artery without angina pectoris: Secondary | ICD-10-CM | POA: Diagnosis not present

## 2023-12-28 NOTE — Progress Notes (Signed)
 [facility] CONSULT NOTE  Patient Care Team: Tower, Laine LABOR, MD as PCP - General (Family Medicine) Swaziland, Peter M, MD as PCP - Cardiology (Cardiology) Mealor, Eulas BRAVO, MD as PCP - Electrophysiology (Cardiology) Ines Onetha NOVAK, MD as Consulting Physician (Neurology)  CHIEF COMPLAINTS/PURPOSE OF CONSULTATION:  IDA  ASSESSMENT & PLAN:  No problem-specific Assessment & Plan notes found for this encounter.  Orders Placed This Encounter  Procedures   CBC with Differential/Platelet    Standing Status:   Future    Expected Date:   01/27/2024    Expiration Date:   12/27/2024   Iron  and Iron  Binding Capacity ([facility] only)    Standing Status:   Future    Expected Date:   01/27/2024    Expiration Date:   12/27/2024   Ferritin    Standing Status:   Future    Expected Date:   01/27/2024    Expiration Date:   12/27/2024   Sample to Blood Bank    Standing Status:   Future    Expected Date:   01/27/2024    Expiration Date:   12/27/2024   Assessment and Plan Assessment & Plan Iron  deficiency anemia due to chronic gastrointestinal blood loss from anastomotic small bowel ulcer with recurrent bleeding Chronic iron  deficiency anemia from recurrent bleeding at anastomotic ulcer site. Eliquis  held due to bleeding risk despite increased stroke risk from atrial fibrillation. Iron  infusions and transfusions maintain hemoglobin. Current hemoglobin 10.1, indicating well-managed iron  levels. Asymptomatic and active, suggesting adequate anemia management.  - Monitor hemoglobin and iron  levels monthly. - Arrange lab work in four weeks to evaluate hemoglobin and iron  levels. - Continue pepcid  for ulcer management.  Atrial fibrillation Chronic atrial fibrillation with high stroke risk. Eliquis  discontinued due to exacerbation of gastrointestinal bleeding. Decision made collaboratively between pt, daughter and her other providers. prioritizing bleeding risk management. - Monitor for  stroke symptoms. - Discuss goals of care if bleeding episodes increase, reassess anticoagulation therapy.    HISTORY OF PRESENTING ILLNESS:  Valerie Ramirez 89 y.o. female is here because of IDA  Discussed the use of AI scribe software for clinical note transcription with the patient, who gave verbal consent to proceed.  History of Present Illness Valerie Ramirez is an 89 year old female with atrial fibrillation and iron  deficiency anemia who presents for management of anemia and bleeding issues.  She has a history of iron  deficiency anemia secondary to gastrointestinal bleeding, recurrent since 2014. A small bowel resection was performed, and an ulcer at the anastomotic site bleeds intermittently. She was hospitalized in May and recently due to severe anemia, receiving iron  infusions and blood transfusions. Currently, she is off Eliquis  due to bleeding risk, with a hemoglobin level of 10.1 as of December 23, 2023.  She has atrial fibrillation, previously managed with Eliquis , which was held due to bleeding risk from the ulcer. She was last evaluated in late August, and further evaluation is scheduled for the 26th of this month.  In May, she experienced a fall after feeling faint, leading to a hospital visit where her anemia was identified. Her daughter notes increased activity recently, including gardening, which she had not done in two years.  Her past medical history includes high blood pressure and endometriosis, leading to bowel resection in 2014. She does not have diabetes or a history of cancer. Her family history includes longevity, with her mother living to 65 years.  She is on iron  supplements and Pantoprazole  for her  ulcer. She lives with her daughter, who assists with her care, and is independent in most activities of daily living, although she does not drive due to vision issues from previous mini-strokes.    REVIEW OF SYSTEMS:   Constitutional: Denies fevers, chills or  abnormal night sweats Eyes: Denies blurriness of vision, double vision or watery eyes Ears, nose, mouth, throat, and face: Denies mucositis or sore throat Respiratory: Denies cough, dyspnea or wheezes Cardiovascular: Denies palpitation, chest discomfort or lower extremity swelling Gastrointestinal:  Denies nausea, heartburn or change in bowel habits Skin: Denies abnormal skin rashes Lymphatics: Denies new lymphadenopathy or easy bruising Neurological:Denies numbness, tingling or new weaknesses Behavioral/Psych: Mood is stable, no new changes  All other systems were reviewed with the patient and are negative.  MEDICAL HISTORY:  Past Medical History:  Diagnosis Date   Arthritis    OA   CAD (coronary artery disease)    stent December 2018   Cancer [facility])    endometrial   GERD (gastroesophageal reflux disease)    Hearing loss    History of kidney stones    Hyperlipidemia    Hypertension    Hypothyroid    not in years   Shortness of breath dyspnea    04/25/17- not anymore    SURGICAL HISTORY: Past Surgical History:  Procedure Laterality Date   ABDOMINAL HYSTERECTOMY     ANTERIOR CERVICAL DECOMP/DISCECTOMY FUSION [name] 08/18/2015   Procedure: C5-6, C6-7 Anterior Cervical Discectomy and Fusion, Allograft, Plate;  Surgeon: Oneil JAYSON Herald, MD;  Location: [facility] OR;  Service: Orthopedics;  Laterality: [name];   bone spur removed Right    shoulder   BOWEL RESECTION  07/04/2012   Procedure: SMALL BOWEL RESECTION;  Surgeon: Toribio LITTIE Percy, MD;  Location: [facility] ORS;  Service: Gynecology;;   BREAST BIOPSY     CHOLECYSTECTOMY     COLONOSCOPY [name] 09/10/2023   Procedure: COLONOSCOPY;  Surgeon: Unk Corinn Skiff, MD;  Location: [facility] ENDOSCOPY;  Service: Gastroenterology;  Laterality: [name];   CORONARY STENT INTERVENTION [name] 04/06/2017   Procedure: CORONARY STENT INTERVENTION;  Surgeon: Darron Deatrice LABOR, MD;  Location: [facility] INVASIVE CV LAB;  Service: Cardiovascular;  Laterality: [name];   DILATION AND  CURETTAGE OF UTERUS  1999   ESOPHAGOGASTRODUODENOSCOPY [name] 09/09/2023   Procedure: EGD (ESOPHAGOGASTRODUODENOSCOPY);  Surgeon: Jinny Carmine, MD;  Location[facility] ENDOSCOPY;  Service: Endoscopy;  Laterality: [name];   EYE SURGERY Bilateral    cataracts   FALSE ANEURYSM REPAIR Right 04/26/2017   Procedure: 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;  Surgeon: Laurence Redell LITTIE, MD;  Location: [facility] OR;  Service: Vascular;  Laterality: Right;   HAND SURGERY  12/2008   after hand fx/due to fall   JOINT REPLACEMENT Bilateral 07/1998   knees   KNEE ARTHROPLASTY  05/23/1998   total right   LAPAROTOMY Bilateral 07/04/2012   Procedure: EXPLORATORY LAPAROTOMY TOTAL ABDOMINAL HYSTERECTOMY BILATERAL SALPINGO-OOPHORECTOMY with small bowel resection ;  Surgeon: Toribio LITTIE Percy, MD;  Location: [facility] ORS;  Service: Gynecology;  Laterality: Bilateral;   LEFT HEART CATH AND CORONARY ANGIOGRAPHY [name] 04/06/2017   Procedure: LEFT HEART CATH AND CORONARY ANGIOGRAPHY;  Surgeon: Cherrie Toribio SAUNDERS, MD;  Location: [facility] INVASIVE CV LAB;  Service: Cardiovascular;  Laterality: [name];   polyp removal  1999   POLYPECTOMY  09/10/2023   Procedure: POLYPECTOMY, INTESTINE;  Surgeon: Unk Corinn Skiff, MD;  Location: [facility] ENDOSCOPY;  Service: Gastroenterology;;   TEE WITHOUT CARDIOVERSION [name] 01/12/2022   Procedure: TRANSESOPHAGEAL ECHOCARDIOGRAM (TEE);  Surgeon: Gollan,  Evalene PARAS, MD;  Location: [facility] ORS;  Service: Cardiovascular;  Laterality: [name];   TUBAL LIGATION      SOCIAL HISTORY: Social History   Socioeconomic History   Marital status: Widowed    Spouse name: Not on file   Number of children: 2   Years of education: Not on file   Highest education level: High school graduate  Occupational History   Not on file  Tobacco Use   Smoking status: Never   Smokeless tobacco: Never  Vaping Use   Vaping status: Never Used  Substance and Sexual Activity   Alcohol use: Never    Alcohol/week: 0.0 standard drinks of alcohol    Drug use: Never   Sexual activity: Never    Birth control/protection: Post-menopausal  Other Topics Concern   Not on file  Social History Narrative   Not on file   Social Drivers of Health   Financial Resource Strain: Low Risk  (12/16/2023)   Received from [facility]   Overall Financial Resource Strain (CARDIA)    Difficulty of Paying Living Expenses: Not very hard  Food Insecurity: No Food Insecurity (12/16/2023)   Received from [facility]   Hunger Vital Sign    Within the past 12 months, you worried that your food would run out before you got the money to buy more.: Never true    Within the past 12 months, the food you bought just didn't last and you didn't have money to get more.: Never true  Transportation Needs: No Transportation Needs (12/16/2023)   Received from [facility] - Transportation    In the past 12 months, has lack of transportation kept you from medical appointments or from getting medications?: No    Lack of Transportation (Non-Medical): No  Physical Activity: Sufficiently Active (08/11/2022)   Exercise Vital Sign    Days of Exercise per Week: 7 days    Minutes of Exercise per Session: 30 min  Stress: No Stress Concern Present (08/11/2022)   [employer] of Occupational Health - Occupational Stress Questionnaire    Feeling of Stress : Not at all  Social Connections: Moderately Isolated (09/08/2023)   Social Connection and Isolation Panel    Frequency of Communication with Friends and Family: More than three times a week    Frequency of Social Gatherings with Friends and Family: Twice a week    Attends Religious Services: More than 4 times per year    Active Member of [employer] or Organizations: No    Attends Banker Meetings: Never    Marital Status: Widowed  Intimate Partner Violence: Not At Risk (11/25/2023)   Humiliation, Afraid, Rape, and Kick questionnaire    Fear of Current or  Ex-Partner: No    Emotionally Abused: No    Physically Abused: No    Sexually Abused: No    FAMILY HISTORY: Family History  Problem Relation Age of Onset   Heart disease Father    Heart attack Father    Stroke Father    Heart disease Sister        CABG   Stroke Sister    Cancer Sister    Bladder Cancer Sister    Stroke Sister        massive brain bleed   Leukemia Brother    Lung cancer Brother    Leukemia Brother    Stomach cancer Brother    Heart disease Brother  CABG   Dementia Neg Hx     ALLERGIES:  is allergic to allopurinol , lipitor [atorvastatin  calcium ], morphine  and codeine, simvastatin, and tape.  MEDICATIONS:  Current Outpatient Medications  Medication Sig Dispense Refill   Acetaminophen  (TYLENOL  8 HOUR PO) Take 3 tablets by mouth daily.     amLODipine  (NORVASC ) 10 MG tablet Take 1 tablet (10 mg total) by mouth daily. 90 tablet 1   Cholecalciferol (VITAMIN D-3) 25 MCG (1000 UT) CAPS Take 1,000 Units by mouth daily.     colchicine  0.6 MG tablet TAKE 1 TABLET BY MOUTH DAILY (Patient taking differently: Take 0.3 mg by mouth daily.) 90 tablet 0   diclofenac  Sodium (VOLTAREN  ARTHRITIS PAIN) 1 % GEL Apply 4 g topically 4 (four) times daily. 100 g 11   donepezil  (ARICEPT ) 10 MG tablet TAKE 1 TABLET BY MOUTH AT BEDTIME 90 tablet 1   DULoxetine  (CYMBALTA ) 30 MG capsule TAKE 1 CAPSULE BY MOUTH DAILY 90 capsule 3   ELIQUIS  2.5 MG TABS tablet TAKE 1 TABLET BY MOUTH TWICE A DAY (Patient not taking: Reported on 12/23/2023) 180 tablet 3   famotidine  (PEPCID ) 20 MG tablet TAKE 1 TABLET BY MOUTH 2 TIMES A DAY 180 tablet 0   ferrous sulfate  (FEROSUL) 325 (65 FE) MG tablet Take 1 tablet (325 mg total) by mouth daily. 90 tablet 0   lidocaine  (LIDODERM ) 5 % Place 1 patch onto the skin daily. Remove & Discard patch within 12 hours or as directed by MD 30 patch 11   losartan  (COZAAR ) 50 MG tablet TAKE 1 TABLET BY MOUTH DAILY 90 tablet 0   Magnesium Chloride (MAG-SR PLUS CALCIUM   PO) Take by mouth. Taking 1 tab once for dizziness     mupirocin  ointment (BACTROBAN ) 2 % APPLY TWO TIMES A DAY TO AFFECTED AREA 22 g 0   nitroGLYCERIN  (NITROSTAT ) 0.4 MG SL tablet Place 1 tablet (0.4 mg total) under the tongue every 5 (five) minutes x 3 doses as needed for chest pain. 25 tablet 12   rosuvastatin  (CRESTOR ) 20 MG tablet TAKE 1 TABLET BY MOUTH AT BEDTIME (Patient taking differently: Take 10 mg by mouth at bedtime.) 90 tablet 3   No current facility-administered medications for this visit.     PHYSICAL EXAMINATION: ECOG PERFORMANCE STATUS: 0 - Asymptomatic  Vitals:   12/28/23 0951  BP: (!) 180/69  Pulse: 88  Resp: 18  Temp: 98.1 F (36.7 C)  SpO2: 100%   Filed Weights   12/28/23 0951  Weight: 181 lb 6.4 oz (82.3 kg)    GENERAL:alert, no distress and comfortable SKIN: skin color, texture, turgor are normal, no rashes or significant lesions EYES: normal, conjunctiva are pink and non-injected, sclera clear OROPHARYNX:no exudate, no erythema and lips, buccal mucosa, and tongue normal  NECK: supple, thyroid  normal size, non-tender, without nodularity LYMPH:  no palpable lymphadenopathy in the cervical, axillary LUNGS: clear to auscultation and percussion with normal breathing effort HEART: regular rate & rhythm and no murmurs and no lower extremity edema ABDOMEN:abdomen soft, non-tender and normal bowel sounds Musculoskeletal:no cyanosis of digits and no clubbing  PSYCH: alert & oriented x 3 with fluent speech NEURO: no focal motor/sensory deficits  LABORATORY DATA:  I have reviewed the data as listed Lab Results  Component Value Date   WBC 7.2 12/23/2023   HGB 10.1 (L) 12/23/2023   HCT 31.9 (L) 12/23/2023   MCV 83.7 12/23/2023   PLT 178.0 12/23/2023     Chemistry      Component Value  Date/Time   NA 139 12/23/2023 1238   NA 140 02/09/2021 1202   K 4.7 12/23/2023 1238   CL 104 12/23/2023 1238   CO2 25 12/23/2023 1238   BUN 27 (H) 12/23/2023 1238   BUN  27 02/09/2021 1202   CREATININE 2.02 (H) 12/23/2023 1238      Component Value Date/Time   CALCIUM  9.2 12/23/2023 1238   ALKPHOS 117 09/09/2023 0557   AST 16 09/09/2023 0557   ALT 10 09/09/2023 0557   BILITOT 1.0 09/09/2023 0557   BILITOT 0.3 02/09/2021 1202       RADIOGRAPHIC STUDIES: I have personally reviewed the radiological images as listed and agreed with the findings in the report. CUP PACEART REMOTE DEVICE CHECK Result Date: 12/22/2023 ILR summary report received. Battery status OK. Normal device function. No new symptom, tachy, brady, or pause episodes. 5 new AF episodes, longest approximately 2 hours, overall burden <1% with brief increase to 10% this review period. Known history of AF, on Eliquis  per Epic. Monthly summary reports and ROV/PRN Routing to triage for increase in AF from previous per protocol. - CS, CVRS   All questions were answered. The patient knows to call the clinic with any problems, questions or concerns. I spent 45 minutes in the care of this patient including H and P, review of records, counseling and coordination of care.     Amber Stalls, MD 12/28/2023 3:45 PM

## 2023-12-28 NOTE — Progress Notes (Signed)
 Pt notified and viewed via MyChart.

## 2023-12-29 ENCOUNTER — Ambulatory Visit: Payer: Self-pay

## 2023-12-29 DIAGNOSIS — H9190 Unspecified hearing loss, unspecified ear: Secondary | ICD-10-CM | POA: Diagnosis not present

## 2023-12-29 DIAGNOSIS — F0390 Unspecified dementia without behavioral disturbance: Secondary | ICD-10-CM | POA: Diagnosis not present

## 2023-12-29 DIAGNOSIS — E039 Hypothyroidism, unspecified: Secondary | ICD-10-CM | POA: Diagnosis not present

## 2023-12-29 DIAGNOSIS — N179 Acute kidney failure, unspecified: Secondary | ICD-10-CM | POA: Diagnosis not present

## 2023-12-29 DIAGNOSIS — I2510 Atherosclerotic heart disease of native coronary artery without angina pectoris: Secondary | ICD-10-CM | POA: Diagnosis not present

## 2023-12-29 DIAGNOSIS — D500 Iron deficiency anemia secondary to blood loss (chronic): Secondary | ICD-10-CM | POA: Diagnosis not present

## 2023-12-29 DIAGNOSIS — Z955 Presence of coronary angioplasty implant and graft: Secondary | ICD-10-CM | POA: Diagnosis not present

## 2023-12-29 DIAGNOSIS — M6281 Muscle weakness (generalized): Secondary | ICD-10-CM | POA: Diagnosis not present

## 2023-12-29 DIAGNOSIS — K633 Ulcer of intestine: Secondary | ICD-10-CM | POA: Diagnosis not present

## 2023-12-29 DIAGNOSIS — I4891 Unspecified atrial fibrillation: Secondary | ICD-10-CM | POA: Diagnosis not present

## 2023-12-29 DIAGNOSIS — R1312 Dysphagia, oropharyngeal phase: Secondary | ICD-10-CM | POA: Diagnosis not present

## 2023-12-29 DIAGNOSIS — Z8673 Personal history of transient ischemic attack (TIA), and cerebral infarction without residual deficits: Secondary | ICD-10-CM | POA: Diagnosis not present

## 2023-12-29 DIAGNOSIS — Z9181 History of falling: Secondary | ICD-10-CM | POA: Diagnosis not present

## 2023-12-29 DIAGNOSIS — C541 Malignant neoplasm of endometrium: Secondary | ICD-10-CM | POA: Diagnosis not present

## 2023-12-29 DIAGNOSIS — D630 Anemia in neoplastic disease: Secondary | ICD-10-CM | POA: Diagnosis not present

## 2024-01-02 NOTE — Progress Notes (Signed)
 Remote Loop Recorder Transmission

## 2024-01-03 DIAGNOSIS — F0390 Unspecified dementia without behavioral disturbance: Secondary | ICD-10-CM | POA: Diagnosis not present

## 2024-01-03 DIAGNOSIS — E039 Hypothyroidism, unspecified: Secondary | ICD-10-CM | POA: Diagnosis not present

## 2024-01-03 DIAGNOSIS — K633 Ulcer of intestine: Secondary | ICD-10-CM | POA: Diagnosis not present

## 2024-01-03 DIAGNOSIS — H9190 Unspecified hearing loss, unspecified ear: Secondary | ICD-10-CM | POA: Diagnosis not present

## 2024-01-03 DIAGNOSIS — Z8673 Personal history of transient ischemic attack (TIA), and cerebral infarction without residual deficits: Secondary | ICD-10-CM | POA: Diagnosis not present

## 2024-01-03 DIAGNOSIS — D500 Iron deficiency anemia secondary to blood loss (chronic): Secondary | ICD-10-CM | POA: Diagnosis not present

## 2024-01-03 DIAGNOSIS — M6281 Muscle weakness (generalized): Secondary | ICD-10-CM | POA: Diagnosis not present

## 2024-01-03 DIAGNOSIS — I2510 Atherosclerotic heart disease of native coronary artery without angina pectoris: Secondary | ICD-10-CM | POA: Diagnosis not present

## 2024-01-03 DIAGNOSIS — D630 Anemia in neoplastic disease: Secondary | ICD-10-CM | POA: Diagnosis not present

## 2024-01-03 DIAGNOSIS — R1312 Dysphagia, oropharyngeal phase: Secondary | ICD-10-CM | POA: Diagnosis not present

## 2024-01-03 DIAGNOSIS — Z9181 History of falling: Secondary | ICD-10-CM | POA: Diagnosis not present

## 2024-01-03 DIAGNOSIS — Z955 Presence of coronary angioplasty implant and graft: Secondary | ICD-10-CM | POA: Diagnosis not present

## 2024-01-03 DIAGNOSIS — C541 Malignant neoplasm of endometrium: Secondary | ICD-10-CM | POA: Diagnosis not present

## 2024-01-03 DIAGNOSIS — I4891 Unspecified atrial fibrillation: Secondary | ICD-10-CM | POA: Diagnosis not present

## 2024-01-03 DIAGNOSIS — N179 Acute kidney failure, unspecified: Secondary | ICD-10-CM | POA: Diagnosis not present

## 2024-01-05 DIAGNOSIS — Z955 Presence of coronary angioplasty implant and graft: Secondary | ICD-10-CM

## 2024-01-05 DIAGNOSIS — C541 Malignant neoplasm of endometrium: Secondary | ICD-10-CM | POA: Diagnosis not present

## 2024-01-05 DIAGNOSIS — K633 Ulcer of intestine: Secondary | ICD-10-CM | POA: Diagnosis not present

## 2024-01-05 DIAGNOSIS — F0390 Unspecified dementia without behavioral disturbance: Secondary | ICD-10-CM

## 2024-01-05 DIAGNOSIS — N179 Acute kidney failure, unspecified: Secondary | ICD-10-CM

## 2024-01-05 DIAGNOSIS — I2510 Atherosclerotic heart disease of native coronary artery without angina pectoris: Secondary | ICD-10-CM

## 2024-01-05 DIAGNOSIS — E039 Hypothyroidism, unspecified: Secondary | ICD-10-CM

## 2024-01-05 DIAGNOSIS — I4891 Unspecified atrial fibrillation: Secondary | ICD-10-CM

## 2024-01-05 DIAGNOSIS — D500 Iron deficiency anemia secondary to blood loss (chronic): Secondary | ICD-10-CM | POA: Diagnosis not present

## 2024-01-05 DIAGNOSIS — H9190 Unspecified hearing loss, unspecified ear: Secondary | ICD-10-CM

## 2024-01-05 DIAGNOSIS — D630 Anemia in neoplastic disease: Secondary | ICD-10-CM | POA: Diagnosis not present

## 2024-01-05 DIAGNOSIS — R1312 Dysphagia, oropharyngeal phase: Secondary | ICD-10-CM

## 2024-01-09 DIAGNOSIS — E039 Hypothyroidism, unspecified: Secondary | ICD-10-CM | POA: Diagnosis not present

## 2024-01-09 DIAGNOSIS — R1312 Dysphagia, oropharyngeal phase: Secondary | ICD-10-CM | POA: Diagnosis not present

## 2024-01-09 DIAGNOSIS — I4891 Unspecified atrial fibrillation: Secondary | ICD-10-CM | POA: Diagnosis not present

## 2024-01-09 DIAGNOSIS — K633 Ulcer of intestine: Secondary | ICD-10-CM | POA: Diagnosis not present

## 2024-01-09 DIAGNOSIS — D630 Anemia in neoplastic disease: Secondary | ICD-10-CM | POA: Diagnosis not present

## 2024-01-09 DIAGNOSIS — M6281 Muscle weakness (generalized): Secondary | ICD-10-CM | POA: Diagnosis not present

## 2024-01-09 DIAGNOSIS — F0390 Unspecified dementia without behavioral disturbance: Secondary | ICD-10-CM | POA: Diagnosis not present

## 2024-01-09 DIAGNOSIS — H9190 Unspecified hearing loss, unspecified ear: Secondary | ICD-10-CM | POA: Diagnosis not present

## 2024-01-09 DIAGNOSIS — I2510 Atherosclerotic heart disease of native coronary artery without angina pectoris: Secondary | ICD-10-CM | POA: Diagnosis not present

## 2024-01-09 DIAGNOSIS — Z955 Presence of coronary angioplasty implant and graft: Secondary | ICD-10-CM | POA: Diagnosis not present

## 2024-01-09 DIAGNOSIS — Z9181 History of falling: Secondary | ICD-10-CM | POA: Diagnosis not present

## 2024-01-09 DIAGNOSIS — N179 Acute kidney failure, unspecified: Secondary | ICD-10-CM | POA: Diagnosis not present

## 2024-01-09 DIAGNOSIS — D500 Iron deficiency anemia secondary to blood loss (chronic): Secondary | ICD-10-CM | POA: Diagnosis not present

## 2024-01-09 DIAGNOSIS — C541 Malignant neoplasm of endometrium: Secondary | ICD-10-CM | POA: Diagnosis not present

## 2024-01-09 DIAGNOSIS — Z8673 Personal history of transient ischemic attack (TIA), and cerebral infarction without residual deficits: Secondary | ICD-10-CM | POA: Diagnosis not present

## 2024-01-13 ENCOUNTER — Encounter: Payer: Self-pay | Admitting: Cardiovascular Disease

## 2024-01-13 ENCOUNTER — Ambulatory Visit: Attending: Cardiovascular Disease | Admitting: Cardiovascular Disease

## 2024-01-13 VITALS — BP 162/90 | HR 78 | Ht 62.0 in | Wt 173.0 lb

## 2024-01-13 DIAGNOSIS — I480 Paroxysmal atrial fibrillation: Secondary | ICD-10-CM | POA: Diagnosis not present

## 2024-01-13 NOTE — Progress Notes (Signed)
 Cardiology Office Note:    Date:  01/13/2024   ID:  EH SAUSEDA, DOB Jul 19, 1932, MRN [id number]  PCP:  Randeen Laine LABOR, MD   [facility] Providers Cardiologist:  Peter Swaziland, MD Electrophysiologist:  Eulas FORBES Furbish, MD     Referring MD: Randeen Laine LABOR, MD   Chief complaint: many strokes  History of Present Illness:    Valerie Ramirez is a 89 y.o. female with a hx of recurrent strokes referred for implantable loop recorder placement; she presents today for routine follow-up.Valerie Ramirez  She has an Abbott loop recorder placed for A-fib screening after stroke.  AF was detected on the loop monitor.  She has been asymptomatic.  AF rates have been controlled.  She is on Eliquis .  She denies any symptoms of AF -- no palpitations, paroxysms of fatigue.  Daughter reports that she had a spell of confusion and just being out of it 2-3 days ago. We interrogated the loop recorder today. There were no events to explain these symptoms.  Since her last visit, she has been managed for chronic iron  deficiency anemia due to GI bleed from anastomotic small bowel ulcer with recurrent bleeding.  She has required iron  infusions and blood transfusions to maintain hemoglobin levels.  She was diagnosed with anemia after presenting to the hospital due to a fall due to weakness/faintness.     EKGs/Labs/Other Studies Reviewed:      EKG:  EKG Interpretation Date/Time:  Friday January 13 2024 09:24:02 EDT Ventricular Rate:  78 PR Interval:  204 QRS Duration:  80 QT Interval:  416 QTC Calculation: 474 R Axis:   30  Text Interpretation: Normal sinus rhythm with sinus arrhythmia ST & T wave abnormality, consider lateral ischemia Prolonged QT When compared with ECG of 05-Oct-2022 09:56, T wave inversion no longer evident in Inferior leads Confirmed by Furbish Eulas [phone]) on 01/13/2024 9:51:28 AM    Recent Labs: 09/09/2023: ALT 10; TSH 3.075 12/23/2023: BUN 27; Creatinine, Ser 2.02; Hemoglobin  10.1; Platelets 178.0; Potassium 4.7; Sodium 139      Physical Exam:    VS:  BP (!) 162/90   Pulse 78   Ht 5' 2 (1.575 m)   Wt 173 lb (78.5 kg)   SpO2 95%   BMI 31.64 kg/m     Wt Readings from Last 3 Encounters:  01/13/24 173 lb (78.5 kg)  12/28/23 181 lb 6.4 oz (82.3 kg)  12/23/23 183 lb 4 oz (83.1 kg)     GEN:  Well nourished, well developed in no acute distress CARDIAC: RRR, no murmurs, rubs, gallops RESPIRATORY:  Normal work of breathing MUSCULOSKELETAL: no edema    ASSESSMENT & PLAN:    Recurrent strokes, likely embolic:  ILR monitor was placed after 30 day monitor did not show any AF.  AF was detected on ILR shortly thereafter.  Eliquis  was discontinued due to recurrent GI bleed I do not think she is a candidate for anticoagulation any longer Watchman is a consideration though she would have to be on anticoagulation for a short duration --her GI bleed has been slow and could be supported with transfusions over a short duration.   Her A-fib burden is actually quite low, and I think it reasonable to treat her with an antiarrhythmic with the possibility of reducing her A-fib burden to 0, which would potentially advocate the need for watchman or anticoagulation The patient and her daughter would like to discuss with family.  If they are interested in considering  the invasive procedure, they may follow-up with one of my watchman implanting colleagues to further discuss the risks/benefits of the invasive procedure versus attempting rhythm control. Given the patient's prolonged QT, poor renal function, I think the best option for rhythm control would be amiodarone.  Atrial fibrillation:  asymptomatic, rate controlled.  Continue to monitor with loop recorder  Secondary hypercoagulable state:  Labs reviewed. Age is > 80 but Cr < 1.49; will need to reduce dose if creatinine declines any further.           Medication Adjustments/Labs and Tests Ordered: Current  medicines are reviewed at length with the patient today.  Concerns regarding medicines are outlined above.  Orders Placed This Encounter  Procedures   EKG 12-Lead   No orders of the defined types were placed in this encounter.    Signed, Eulas FORBES Furbish, MD  01/13/2024 9:34 AM    [facility]

## 2024-01-13 NOTE — Patient Instructions (Signed)
 Medication Instructions:  Your physician recommends that you continue on your current medications as directed. Please refer to the Current Medication list given to you today.  *If you need a refill on your cardiac medications before your next appointment, please call your pharmacy*  Lab Work: None ordered. If you have labs (blood work) drawn today and your tests are completely normal, you will receive your results only by: MyChart Message (if you have MyChart) OR A paper copy in the mail If you have any lab test that is abnormal or we need to change your treatment, we will call you to review the results.  Testing/Procedures: Please contact our office if you decide you would like to discuss with Watchman procedure with one of our physicians.  Follow-Up: At [facility], you and your health needs are our priority.  As part of our continuing mission to provide you with exceptional heart care, our providers are all part of one team.  This team includes your primary Cardiologist (physician) and Advanced Practice Providers or APPs (Physician Assistants and Nurse Practitioners) who all work together to provide you with the care you need, when you need it.  Your next appointment:   1 year(s)  Provider:   You will see one of the following Advanced Practice Providers on your designated Care Team:   Charlies Arthur, [place] Ozell Jodie Passey, PA-C Suzann Riddle, NP Daphne Barrack, NP Artist Pouch, PA-C   We recommend signing up for the patient portal called MyChart.  Sign up information is provided on this After Visit Summary.  MyChart is used to connect with patients for Virtual Visits (Telemedicine).  Patients are able to view lab/test results, encounter notes, upcoming appointments, etc.  Non-urgent messages can be sent to your provider as well.   To learn more about what you can do with MyChart, go to [email or web address].

## 2024-01-16 DIAGNOSIS — N179 Acute kidney failure, unspecified: Secondary | ICD-10-CM | POA: Diagnosis not present

## 2024-01-16 DIAGNOSIS — E039 Hypothyroidism, unspecified: Secondary | ICD-10-CM | POA: Diagnosis not present

## 2024-01-16 DIAGNOSIS — Z955 Presence of coronary angioplasty implant and graft: Secondary | ICD-10-CM | POA: Diagnosis not present

## 2024-01-16 DIAGNOSIS — D630 Anemia in neoplastic disease: Secondary | ICD-10-CM | POA: Diagnosis not present

## 2024-01-16 DIAGNOSIS — R1312 Dysphagia, oropharyngeal phase: Secondary | ICD-10-CM | POA: Diagnosis not present

## 2024-01-16 DIAGNOSIS — I2510 Atherosclerotic heart disease of native coronary artery without angina pectoris: Secondary | ICD-10-CM | POA: Diagnosis not present

## 2024-01-16 DIAGNOSIS — F0390 Unspecified dementia without behavioral disturbance: Secondary | ICD-10-CM | POA: Diagnosis not present

## 2024-01-16 DIAGNOSIS — H9190 Unspecified hearing loss, unspecified ear: Secondary | ICD-10-CM | POA: Diagnosis not present

## 2024-01-16 DIAGNOSIS — C541 Malignant neoplasm of endometrium: Secondary | ICD-10-CM | POA: Diagnosis not present

## 2024-01-16 DIAGNOSIS — Z9181 History of falling: Secondary | ICD-10-CM | POA: Diagnosis not present

## 2024-01-16 DIAGNOSIS — M6281 Muscle weakness (generalized): Secondary | ICD-10-CM | POA: Diagnosis not present

## 2024-01-16 DIAGNOSIS — I4891 Unspecified atrial fibrillation: Secondary | ICD-10-CM | POA: Diagnosis not present

## 2024-01-16 DIAGNOSIS — Z8673 Personal history of transient ischemic attack (TIA), and cerebral infarction without residual deficits: Secondary | ICD-10-CM | POA: Diagnosis not present

## 2024-01-16 DIAGNOSIS — D500 Iron deficiency anemia secondary to blood loss (chronic): Secondary | ICD-10-CM | POA: Diagnosis not present

## 2024-01-16 DIAGNOSIS — K633 Ulcer of intestine: Secondary | ICD-10-CM | POA: Diagnosis not present

## 2024-01-16 NOTE — Progress Notes (Signed)
 Remote Loop Recorder Transmission

## 2024-01-23 ENCOUNTER — Ambulatory Visit: Payer: Self-pay

## 2024-01-25 ENCOUNTER — Inpatient Hospital Stay: Attending: Hematology and Oncology

## 2024-01-25 DIAGNOSIS — D500 Iron deficiency anemia secondary to blood loss (chronic): Secondary | ICD-10-CM | POA: Insufficient documentation

## 2024-01-25 DIAGNOSIS — K284 Chronic or unspecified gastrojejunal ulcer with hemorrhage: Secondary | ICD-10-CM | POA: Diagnosis not present

## 2024-01-25 LAB — CBC WITH DIFFERENTIAL/PLATELET
Abs Immature Granulocytes: 0.02 K/uL (ref 0.00–0.07)
Basophils Absolute: 0.1 K/uL (ref 0.0–0.1)
Basophils Relative: 1 %
Eosinophils Absolute: 0.4 K/uL (ref 0.0–0.5)
Eosinophils Relative: 6 %
HCT: 35.4 % — ABNORMAL LOW (ref 36.0–46.0)
Hemoglobin: 11.4 g/dL — ABNORMAL LOW (ref 12.0–15.0)
Immature Granulocytes: 0 %
Lymphocytes Relative: 19 %
Lymphs Abs: 1.3 K/uL (ref 0.7–4.0)
MCH: 28.9 pg (ref 26.0–34.0)
MCHC: 32.2 g/dL (ref 30.0–36.0)
MCV: 89.6 fL (ref 80.0–100.0)
Monocytes Absolute: 0.6 K/uL (ref 0.1–1.0)
Monocytes Relative: 9 %
Neutro Abs: 4.4 K/uL (ref 1.7–7.7)
Neutrophils Relative %: 65 %
Platelets: 159 K/uL (ref 150–400)
RBC: 3.95 MIL/uL (ref 3.87–5.11)
RDW: 20.7 % — ABNORMAL HIGH (ref 11.5–15.5)
WBC: 6.7 K/uL (ref 4.0–10.5)
nRBC: 0 % (ref 0.0–0.2)

## 2024-01-25 LAB — FERRITIN: Ferritin: 153 ng/mL (ref 11–307)

## 2024-01-25 LAB — IRON AND IRON BINDING CAPACITY (CC-WL,HP ONLY)
Iron: 62 ug/dL (ref 28–170)
Saturation Ratios: 23 % (ref 10.4–31.8)
TIBC: 276 ug/dL (ref 250–450)
UIBC: 214 ug/dL (ref 148–442)

## 2024-01-26 ENCOUNTER — Inpatient Hospital Stay (HOSPITAL_BASED_OUTPATIENT_CLINIC_OR_DEPARTMENT_OTHER): Admitting: Hematology and Oncology

## 2024-01-26 DIAGNOSIS — D500 Iron deficiency anemia secondary to blood loss (chronic): Secondary | ICD-10-CM

## 2024-01-26 DIAGNOSIS — K922 Gastrointestinal hemorrhage, unspecified: Secondary | ICD-10-CM

## 2024-01-26 NOTE — Progress Notes (Signed)
 [facility] CONSULT NOTE  Patient Care Team: Tower, Laine LABOR, MD as PCP - General (Family Medicine) [place], Peter M, MD as PCP - Cardiology (Cardiology) Mealor, Eulas BRAVO, MD as PCP - Electrophysiology (Cardiology) Ines Onetha NOVAK, MD as Consulting Physician (Neurology)  CHIEF COMPLAINTS/PURPOSE OF CONSULTATION:  IDA  ASSESSMENT & PLAN:  Assessment & Plan Iron  deficiency anemia due to chronic gastrointestinal blood loss from anastomotic small bowel ulcer with recurrent bleeding Chronic iron  deficiency anemia from recurrent bleeding at anastomotic ulcer site.  CBC from last week with Hb of 11.4 g/dl. She is doing well She can RTC in 4 weeks with labs or sooner PRN  Atrial fibrillation Chronic atrial fibrillation with high stroke risk. She is not on anticoagulation because of risk of bleeding. She is considering watchman device and will stay in touch with cardiology.  HISTORY OF PRESENTING ILLNESS:  Valerie Ramirez 89 y.o. female is here because of IDA  Discussed the use of AI scribe software for clinical note transcription with the patient, who gave verbal consent to proceed.  History of Present Illness  This is a telephone follow up to review her lab results. Daughter spoke on the phone. Daughter mentioned that the cardiologist recommended amiodarone but they didn't want to give it to her because of the side effects. The family is hoping that she get the watchman device so she doesn't have to take blood thinners again. Otherwise pt is feeling well Rest of the pertinent 10 point ROS reviewed and neg.  MEDICAL HISTORY:  Past Medical History:  Diagnosis Date   Arthritis    OA   CAD (coronary artery disease)    stent December 2018   Cancer [facility])    endometrial   GERD (gastroesophageal reflux disease)    Hearing loss    History of kidney stones    Hyperlipidemia    Hypertension    Hypothyroid    not in years   Shortness of breath dyspnea    04/25/17- not  anymore    SURGICAL HISTORY: Past Surgical History:  Procedure Laterality Date   ABDOMINAL HYSTERECTOMY     ANTERIOR CERVICAL DECOMP/DISCECTOMY FUSION [name] 08/18/2015   Procedure: C5-6, C6-7 Anterior Cervical Discectomy and Fusion, Allograft, Plate;  Surgeon: Oneil JAYSON Herald, MD;  Location: [facility] OR;  Service: Orthopedics;  Laterality: [name];   bone spur removed Right    shoulder   BOWEL RESECTION  07/04/2012   Procedure: SMALL BOWEL RESECTION;  Surgeon: Toribio LITTIE Percy, MD;  Location: [facility] ORS;  Service: Gynecology;;   BREAST BIOPSY     CHOLECYSTECTOMY     COLONOSCOPY [name] 09/10/2023   Procedure: COLONOSCOPY;  Surgeon: Unk Corinn Skiff, MD;  Location: [facility] ENDOSCOPY;  Service: Gastroenterology;  Laterality: [name];   CORONARY STENT INTERVENTION [name] 04/06/2017   Procedure: CORONARY STENT INTERVENTION;  Surgeon: Darron Deatrice LABOR, MD;  Location: [facility] INVASIVE CV LAB;  Service: Cardiovascular;  Laterality: [name];   DILATION AND CURETTAGE OF UTERUS  1999   ESOPHAGOGASTRODUODENOSCOPY [name] 09/09/2023   Procedure: EGD (ESOPHAGOGASTRODUODENOSCOPY);  Surgeon: Jinny Carmine, MD;  Location: [facility] ENDOSCOPY;  Service: Endoscopy;  Laterality: [name];   EYE SURGERY Bilateral    cataracts   FALSE ANEURYSM REPAIR Right 04/26/2017   Procedure: 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;  Surgeon: Laurence Redell LITTIE, MD;  Location: [facility] OR;  Service: Vascular;  Laterality: Right;   HAND SURGERY  12/2008   after hand fx/due to fall   JOINT REPLACEMENT Bilateral 07/1998  knees   KNEE ARTHROPLASTY  05/23/1998   total right   LAPAROTOMY Bilateral 07/04/2012   Procedure: EXPLORATORY LAPAROTOMY TOTAL ABDOMINAL HYSTERECTOMY BILATERAL SALPINGO-OOPHORECTOMY with small bowel resection ;  Surgeon: Toribio LITTIE Percy, MD;  Location: [facility] ORS;  Service: Gynecology;  Laterality: Bilateral;   LEFT HEART CATH AND CORONARY ANGIOGRAPHY [name] 04/06/2017   Procedure: LEFT HEART CATH AND CORONARY ANGIOGRAPHY;  Surgeon: Cherrie Toribio SAUNDERS,  MD;  Location: [facility] INVASIVE CV LAB;  Service: Cardiovascular;  Laterality: [name];   polyp removal  1999   POLYPECTOMY  09/10/2023   Procedure: POLYPECTOMY, INTESTINE;  Surgeon: Unk Corinn Skiff, MD;  Location: [facility] ENDOSCOPY;  Service: Gastroenterology;;   TEE WITHOUT CARDIOVERSION [name] 01/12/2022   Procedure: TRANSESOPHAGEAL ECHOCARDIOGRAM (TEE);  Surgeon: Perla Evalene PARAS, MD;  Location: [facility] ORS;  Service: Cardiovascular;  Laterality: [name];   TUBAL LIGATION      SOCIAL HISTORY: Social History   Socioeconomic History   Marital status: Widowed    Spouse name: Not on file   Number of children: 2   Years of education: Not on file   Highest education level: High school graduate  Occupational History   Not on file  Tobacco Use   Smoking status: Never   Smokeless tobacco: Never  Vaping Use   Vaping status: Never Used  Substance and Sexual Activity   Alcohol use: Never    Alcohol/week: 0.0 standard drinks of alcohol   Drug use: Never   Sexual activity: Never    Birth control/protection: Post-menopausal  Other Topics Concern   Not on file  Social History Narrative   Not on file   Social Drivers of Health   Financial Resource Strain: Low Risk  (12/16/2023)   Received from [facility]   Overall Financial Resource Strain (CARDIA)    Difficulty of Paying Living Expenses: Not very hard  Food Insecurity: No Food Insecurity (12/16/2023)   Received from [facility]   Hunger Vital Sign    Within the past 12 months, you worried that your food would run out before you got the money to buy more.: Never true    Within the past 12 months, the food you bought just didn't last and you didn't have money to get more.: Never true  Transportation Needs: No Transportation Needs (12/16/2023)   Received from [facility] - Transportation    In the past 12 months, has lack of transportation kept you from medical appointments or from getting  medications?: No    Lack of Transportation (Non-Medical): No  Physical Activity: Sufficiently Active (08/11/2022)   Exercise Vital Sign    Days of Exercise per Week: 7 days    Minutes of Exercise per Session: 30 min  Stress: No Stress Concern Present (08/11/2022)   [employer] of Occupational Health - Occupational Stress Questionnaire    Feeling of Stress : Not at all  Social Connections: Moderately Isolated (09/08/2023)   Social Connection and Isolation Panel    Frequency of Communication with Friends and Family: More than three times a week    Frequency of Social Gatherings with Friends and Family: Twice a week    Attends Religious Services: More than 4 times per year    Active Member of [employer] or Organizations: No    Attends Banker Meetings: Never    Marital Status: Widowed  Intimate Partner Violence: Not At Risk (11/25/2023)   Humiliation, Afraid, Rape, and Kick questionnaire    Fear  of Current or Ex-Partner: No    Emotionally Abused: No    Physically Abused: No    Sexually Abused: No    FAMILY HISTORY: Family History  Problem Relation Age of Onset   Heart disease Father    Heart attack Father    Stroke Father    Heart disease Sister        CABG   Stroke Sister    Cancer Sister    Bladder Cancer Sister    Stroke Sister        massive brain bleed   Leukemia Brother    Lung cancer Brother    Leukemia Brother    Stomach cancer Brother    Heart disease Brother        CABG   Dementia Neg Hx     ALLERGIES:  is allergic to allopurinol , lipitor [atorvastatin  calcium ], morphine  and codeine, simvastatin, and tape.  MEDICATIONS:  Current Outpatient Medications  Medication Sig Dispense Refill   Acetaminophen  (TYLENOL  8 HOUR PO) Take 3 tablets by mouth daily.     amLODipine  (NORVASC ) 10 MG tablet Take 1 tablet (10 mg total) by mouth daily. 90 tablet 1   Cholecalciferol (VITAMIN D-3) 25 MCG (1000 UT) CAPS Take 1,000 Units by mouth daily.     colchicine  0.6  MG tablet TAKE 1 TABLET BY MOUTH DAILY (Patient taking differently: Take 0.3 mg by mouth daily.) 90 tablet 0   diclofenac  Sodium (VOLTAREN  ARTHRITIS PAIN) 1 % GEL Apply 4 g topically 4 (four) times daily. 100 g 11   donepezil  (ARICEPT ) 10 MG tablet TAKE 1 TABLET BY MOUTH AT BEDTIME 90 tablet 1   DULoxetine  (CYMBALTA ) 30 MG capsule TAKE 1 CAPSULE BY MOUTH DAILY 90 capsule 3   ELIQUIS  2.5 MG TABS tablet TAKE 1 TABLET BY MOUTH TWICE A DAY (Patient not taking: Reported on 12/23/2023) 180 tablet 3   famotidine  (PEPCID ) 20 MG tablet TAKE 1 TABLET BY MOUTH 2 TIMES A DAY 180 tablet 0   ferrous sulfate  (FEROSUL) 325 (65 FE) MG tablet Take 1 tablet (325 mg total) by mouth daily. 90 tablet 0   lidocaine  (LIDODERM ) 5 % Place 1 patch onto the skin daily. Remove & Discard patch within 12 hours or as directed by MD 30 patch 11   losartan  (COZAAR ) 50 MG tablet TAKE 1 TABLET BY MOUTH DAILY 90 tablet 0   Magnesium Chloride (MAG-SR PLUS CALCIUM  PO) Take by mouth. Taking 1 tab once for dizziness     mupirocin  ointment (BACTROBAN ) 2 % APPLY TWO TIMES A DAY TO AFFECTED AREA 22 g 0   nitroGLYCERIN  (NITROSTAT ) 0.4 MG SL tablet Place 1 tablet (0.4 mg total) under the tongue every 5 (five) minutes x 3 doses as needed for chest pain. 25 tablet 12   rosuvastatin  (CRESTOR ) 20 MG tablet TAKE 1 TABLET BY MOUTH AT BEDTIME (Patient taking differently: Take 10 mg by mouth at bedtime.) 90 tablet 3   No current facility-administered medications for this visit.     PHYSICAL EXAMINATION: ECOG PERFORMANCE STATUS: 0 - Asymptomatic  There were no vitals filed for this visit.  There were no vitals filed for this visit.   Telephone visit  LABORATORY DATA:  I have reviewed the data as listed Lab Results  Component Value Date   WBC 6.7 01/25/2024   HGB 11.4 (L) 01/25/2024   HCT 35.4 (L) 01/25/2024   MCV 89.6 01/25/2024   PLT 159 01/25/2024     Chemistry      Component  Value Date/Time   NA 139 12/23/2023 1238   NA 140  02/09/2021 1202   K 4.7 12/23/2023 1238   CL 104 12/23/2023 1238   CO2 25 12/23/2023 1238   BUN 27 (H) 12/23/2023 1238   BUN 27 02/09/2021 1202   CREATININE 2.02 (H) 12/23/2023 1238      Component Value Date/Time   CALCIUM  9.2 12/23/2023 1238   ALKPHOS 117 09/09/2023 0557   AST 16 09/09/2023 0557   ALT 10 09/09/2023 0557   BILITOT 1.0 09/09/2023 0557   BILITOT 0.3 02/09/2021 1202       RADIOGRAPHIC STUDIES: I have personally reviewed the radiological images as listed and agreed with the findings in the report. No results found.   All questions were answered. The patient knows to call the clinic with any problems, questions or concerns. I spent 20 minutes in the care of this patient including H and P, review of records, counseling and coordination of care.  I connected with  Valerie Ramirez on 01/26/24 by a telephone application and verified that I am speaking with the correct person using two identifiers.   I discussed the limitations of evaluation and management by telemedicine. The patient expressed understanding and agreed to proceed.  Location of pt: Home Location of provider: office    Amber Stalls, MD 01/26/2024 5:10 PM

## 2024-01-27 NOTE — Progress Notes (Signed)
 Remote Loop Recorder Transmission

## 2024-01-30 ENCOUNTER — Ambulatory Visit: Payer: Self-pay

## 2024-01-31 DIAGNOSIS — D500 Iron deficiency anemia secondary to blood loss (chronic): Secondary | ICD-10-CM | POA: Diagnosis not present

## 2024-01-31 DIAGNOSIS — K289 Gastrojejunal ulcer, unspecified as acute or chronic, without hemorrhage or perforation: Secondary | ICD-10-CM | POA: Diagnosis not present

## 2024-01-31 DIAGNOSIS — K5520 Angiodysplasia of colon without hemorrhage: Secondary | ICD-10-CM | POA: Diagnosis not present

## 2024-02-03 ENCOUNTER — Encounter: Payer: Self-pay | Admitting: *Deleted

## 2024-02-08 NOTE — Progress Notes (Unsigned)
 Cardiology Office Note    Date:  02/22/2024   ID:  [name], [name] 02-02-1933, MRN [id number]  PCP:  Randeen Laine LABOR, MD  Cardiologist:  Dr. Kenden Brandt  Chief Complaint  Patient presents with   Atrial Fibrillation    History of Present Illness:  Valerie Ramirez is a 89 y.o. female with PMH of HTN, HLD, hypothyroidism and CAD.  Echocardiogram obtained on 04/05/2017 showed EF 55-60%, grade 2 DD, PA peak pressure 34 mmHg.  She was ruled in for NSTEMI.  She eventually underwent cardiac catheterization on 04/06/2017 which showed a 95% mid left circumflex lesion treated with 3.5 x 28 mm DES, there was a 40% ostial to proximal LAD residual and to 60% proximal RCA residual.  Due to her intolerance of Lipitor and simvastatin, she was started on low-dose Crestor .    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 Laurence on 04/26/2017.  Daughter at the time also mentioned some mental status change immediately after the discharge, they did not seek medical attention at the time. MRI unfortunately did show punctate acute/early subacute microvascular infarct in the right anterior caudate body, chronic right occipital infarction, small chronic lacunar infarct in the right putamen extending into the mid corona radiata.  She was admitted in June 2019 after a fall and facial trauma with nasal fracture and subdural hematoma. DAPT was held. Later resumed as outpatient and repeat CT showed resolution of hematoma. Fall was felt to be related to orthostatic hypotension and antihypertensives were reduced with significant improvement in how she felt.   She did have Covid 19 in July 2021. She had some dizziness and passing out at that time. Medications were reduced- lisinopril  was stopped and Coreg  reduced.   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 She had a loop recorder placed that did show Afib. She was started on  Eliquis . TEE was fairly normal with only mild MR. Since then she has developed recurrent GI bleeds requiring transfusion. Eliquis  stopped. Was seen by EP - Dr Nancey with consideration for AAD therapy and/or watchman.  She was admitted to [facility] 5/22-5/25 for anemia with a similar presentation and was transfused with 2u pRBCs, found to be severely iron  deficient. She underwent a thorough GI workup to look for a source of bleeding. EGD and colonoscopy unrevealing but capsule endoscopy reveals ulcer at anastomosis of prior small bowel resection, as well as a small bowel AVM. After risk/benefit discussion, decision was made to continue apixaban  with close monitoring. Referral was made to [facility] for DBE and she was discharged with PO iron . She has been taking oral iron  since then.   She was readmitted in August with severe iron  deficiency anemia with Hgb 6.4. transfused again. CT negative for bleed. Recommended she remain off Eliquis  given recurrent severe anemia and likely slow GI blood loss. Seen with daughter today to discuss options.    Past Medical History:  Diagnosis Date   Arthritis    OA   CAD (coronary artery disease)    stent December 2018   Cancer [facility])    endometrial   GERD (gastroesophageal reflux disease)    Hearing loss    History of kidney stones    Hyperlipidemia    Hypertension    Hypothyroid    not in years   Shortness of breath dyspnea    04/25/17- not anymore  Past Surgical History:  Procedure Laterality Date   ABDOMINAL HYSTERECTOMY     ANTERIOR CERVICAL DECOMP/DISCECTOMY FUSION [name] 08/18/2015   Procedure: C5-6, C6-7 Anterior Cervical Discectomy and Fusion, Allograft, Plate;  Surgeon: Oneil JAYSON Herald, MD;  Location: [facility] OR;  Service: Orthopedics;  Laterality: [name];   bone spur removed Right    shoulder   BOWEL RESECTION  07/04/2012   Procedure: SMALL BOWEL RESECTION;  Surgeon: Toribio LITTIE Percy, MD;  Location: [facility] ORS;  Service: Gynecology;;   BREAST BIOPSY      CHOLECYSTECTOMY     COLONOSCOPY [name] 09/10/2023   Procedure: COLONOSCOPY;  Surgeon: Unk Corinn Skiff, MD;  Location: [facility] ENDOSCOPY;  Service: Gastroenterology;  Laterality: [name];   CORONARY STENT INTERVENTION [name] 04/06/2017   Procedure: CORONARY STENT INTERVENTION;  Surgeon: Darron Deatrice LABOR, MD;  Location: [facility] INVASIVE CV LAB;  Service: Cardiovascular;  Laterality: [name];   DILATION AND CURETTAGE OF UTERUS  1999   ESOPHAGOGASTRODUODENOSCOPY [name] 09/09/2023   Procedure: EGD (ESOPHAGOGASTRODUODENOSCOPY);  Surgeon: Jinny Carmine, MD;  Location: [facility] ENDOSCOPY;  Service: Endoscopy;  Laterality: [name];   EYE SURGERY Bilateral    cataracts   FALSE ANEURYSM REPAIR Right 04/26/2017   Procedure: 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;  Surgeon: Laurence Redell LITTIE, MD;  Location: [facility] OR;  Service: Vascular;  Laterality: Right;   HAND SURGERY  12/2008   after hand fx/due to fall   JOINT REPLACEMENT Bilateral 07/1998   knees   KNEE ARTHROPLASTY  05/23/1998   total right   LAPAROTOMY Bilateral 07/04/2012   Procedure: EXPLORATORY LAPAROTOMY TOTAL ABDOMINAL HYSTERECTOMY BILATERAL SALPINGO-OOPHORECTOMY with small bowel resection ;  Surgeon: Toribio LITTIE Percy, MD;  Location: [facility] ORS;  Service: Gynecology;  Laterality: Bilateral;   LEFT HEART CATH AND CORONARY ANGIOGRAPHY [name] 04/06/2017   Procedure: LEFT HEART CATH AND CORONARY ANGIOGRAPHY;  Surgeon: Cherrie Toribio SAUNDERS, MD;  Location: [facility] INVASIVE CV LAB;  Service: Cardiovascular;  Laterality: [name];   polyp removal  1999   POLYPECTOMY  09/10/2023   Procedure: POLYPECTOMY, INTESTINE;  Surgeon: Unk Corinn Skiff, MD;  Location: [facility] ENDOSCOPY;  Service: Gastroenterology;;   TEE WITHOUT CARDIOVERSION [name] 01/12/2022   Procedure: TRANSESOPHAGEAL ECHOCARDIOGRAM (TEE);  Surgeon: Gollan, Timothy J, MD;  Location: [facility] ORS;  Service: Cardiovascular;  Laterality: [name];   TUBAL LIGATION      Current Medications: Outpatient Medications Prior to Visit  Medication Sig  Dispense Refill   Acetaminophen  (TYLENOL  8 HOUR PO) Take 3 tablets by mouth daily.     amLODipine  (NORVASC ) 10 MG tablet Take 1 tablet (10 mg total) by mouth daily. 90 tablet 1   Cholecalciferol (VITAMIN D-3) 25 MCG (1000 UT) CAPS Take 1,000 Units by mouth daily.     colchicine  0.6 MG tablet TAKE 1 TABLET BY MOUTH DAILY (Patient taking differently: Take 0.3 mg by mouth daily.) 90 tablet 0   diclofenac  Sodium (VOLTAREN  ARTHRITIS PAIN) 1 % GEL Apply 4 g topically 4 (four) times daily. 100 g 11   donepezil  (ARICEPT ) 10 MG tablet TAKE 1 TABLET BY MOUTH AT BEDTIME 90 tablet 1   DULoxetine  (CYMBALTA ) 30 MG capsule TAKE 1 CAPSULE BY MOUTH DAILY 90 capsule 3   famotidine  (PEPCID ) 20 MG tablet TAKE 1 TABLET BY MOUTH 2 TIMES A DAY 180 tablet 0   ferrous sulfate  (FEROSUL) 325 (65 FE) MG tablet Take 1 tablet (325 mg total) by mouth daily. 90 tablet 0   lidocaine  (LIDODERM ) 5 % Place 1 patch onto the skin daily. Remove & Discard  patch within 12 hours or as directed by MD 30 patch 11   losartan  (COZAAR ) 50 MG tablet TAKE 1 TABLET BY MOUTH DAILY 90 tablet 0   Magnesium Chloride (MAG-SR PLUS CALCIUM  PO) Take by mouth. Taking 1 tab once for dizziness     mupirocin  ointment (BACTROBAN ) 2 % APPLY TWO TIMES A DAY TO AFFECTED AREA 22 g 0   nitroGLYCERIN  (NITROSTAT ) 0.4 MG SL tablet Place 1 tablet (0.4 mg total) under the tongue every 5 (five) minutes x 3 doses as needed for chest pain. 25 tablet 12   rosuvastatin  (CRESTOR ) 20 MG tablet TAKE 1 TABLET BY MOUTH AT BEDTIME (Patient taking differently: Take 10 mg by mouth at bedtime.) 90 tablet 3   ELIQUIS  2.5 MG TABS tablet TAKE 1 TABLET BY MOUTH TWICE A DAY (Patient not taking: Reported on 02/22/2024) 180 tablet 3   No facility-administered medications prior to visit.     Allergies:   Allopurinol , Lipitor [atorvastatin  calcium ], Morphine  and codeine, Simvastatin, and Tape   Social History   Socioeconomic History   Marital status: Widowed    Spouse name: Not on  file   Number of children: 2   Years of education: Not on file   Highest education level: High school graduate  Occupational History   Not on file  Tobacco Use   Smoking status: Never   Smokeless tobacco: Never  Vaping Use   Vaping status: Never Used  Substance and Sexual Activity   Alcohol use: Never    Alcohol/week: 0.0 standard drinks of alcohol   Drug use: Never   Sexual activity: Never    Birth control/protection: Post-menopausal  Other Topics Concern   Not on file  Social History Narrative   Not on file   Social Drivers of Health   Financial Resource Strain: Low Risk  (12/16/2023)   Received from [facility]   Overall Financial Resource Strain (CARDIA)    Difficulty of Paying Living Expenses: Not very hard  Food Insecurity: No Food Insecurity (12/16/2023)   Received from [facility]   Hunger Vital Sign    Within the past 12 months, you worried that your food would run out before you got the money to buy more.: Never true    Within the past 12 months, the food you bought just didn't last and you didn't have money to get more.: Never true  Transportation Needs: No Transportation Needs (12/16/2023)   Received from [facility] - Transportation    In the past 12 months, has lack of transportation kept you from medical appointments or from getting medications?: No    Lack of Transportation (Non-Medical): No  Physical Activity: Sufficiently Active (08/11/2022)   Exercise Vital Sign    Days of Exercise per Week: 7 days    Minutes of Exercise per Session: 30 min  Stress: No Stress Concern Present (08/11/2022)   [employer]ational [employer] - Occupational Stress Questionnaire    Feeling of Stress : Not at all  Social Connections: Moderately Isolated (09/08/2023)   Social Connection and Isolation Panel    Frequency of Communication with Friends and Family: More than three times a week    Frequency of  Social Gatherings with Friends and Family: Twice a week    Attends Religious Services: More than 4 times per year    Active Member of [employer] or Organizations: No    Attends Banker Meetings: Never    Marital  Status: Widowed     Family History:  The patient's family history includes Bladder Cancer in her sister; Cancer in her sister; Heart attack in her father; Heart disease in her brother, father, and sister; Leukemia in her brother and brother; Lung cancer in her brother; Stomach cancer in her brother; Stroke in her father, sister, and sister.   ROS:   Please see the history of present illness.    ROS All other systems reviewed and are negative.   PHYSICAL EXAM:   VS:  BP (!) 146/70   Pulse 87   Ht 5' 2 (1.575 m)   Wt 180 lb (81.6 kg)   SpO2 98%   BMI 32.92 kg/m    GENERAL:  Well appearing obese WF in NAD HEENT:  PERRL, EOMI, sclera are clear. Oropharynx is clear. NECK:  No jugular venous distention, carotid upstroke brisk and symmetric, no bruits, no thyromegaly or adenopathy LUNGS:  Clear to auscultation bilaterally CHEST:  Unremarkable HEART:  RRR,  PMI not displaced or sustained,S1 and S2 within normal limits, no S3, no S4: no clicks, no rubs, no murmurs ABD:  Soft, nontender. BS +, no masses or bruits. No hepatomegaly, no splenomegaly EXT:  2 + pulses throughout, tr edema, no cyanosis no clubbing SKIN:  Warm and dry.  No rashes NEURO:  Alert and oriented x 3. Cranial nerves II through XII intact. PSYCH:  Cognitively intact    Wt Readings from Last 3 Encounters:  02/22/24 180 lb (81.6 kg)  01/13/24 173 lb (78.5 kg)  12/28/23 181 lb 6.4 oz (82.3 kg)      Studies/Labs Reviewed:   EKG:  EKG is not done today   Recent Labs: 09/09/2023: ALT 10; TSH 3.075 12/23/2023: BUN 27; Creatinine, Ser 2.02; Potassium 4.7; Sodium 139 01/25/2024: Hemoglobin 11.4; Platelets 159   Lipid Panel    Component Value Date/Time   CHOL 178 09/07/2023 0907   CHOL 206 (H)  02/09/2021 1202   TRIG 98.0 09/07/2023 0907   HDL 54.40 09/07/2023 0907   HDL 53 02/09/2021 1202   CHOLHDL 3 09/07/2023 0907   VLDL 19.6 09/07/2023 0907   LDLCALC 104 (H) 09/07/2023 0907   LDLCALC 130 (H) 02/09/2021 1202   LDLDIRECT 136.0 10/31/2015 0959    Additional studies/ records that were reviewed today include:   Cath 04/06/2017 Conclusion      Mid Cx lesion is 95% stenosed. A drug-eluting stent was successfully placed using a STENT SVELTE RX 3.50 X . Post intervention, there is a 0% residual stenosis.   Successful angioplasty and drug-eluting stent placement to the mid left circumflex.      Echo 04/05/2017 LV EF: 55% -   60%   Study Conclusions   - Left ventricle: The cavity size was normal. Wall thickness was   increased in a pattern of severe LVH. Systolic function was   normal. The estimated ejection fraction was in the range of 55%   to 60%. Wall motion was normal; there were no regional wall   motion abnormalities. Doppler parameters are consistent with   pseudonormal left ventricular relaxation (grade 2 diastolic   dysfunction). The E/e&' ratio is >15, suggesting elevated LV   filling pressure. - Aortic valve: Sclerosis without stenosis. Transvalvular velocity   was minimally increased. Mean gradient (S): 7 mm Hg. - Mitral valve: Mildly thickened leaflets . There was trivial   regurgitation. - Left atrium: Moderately dilated. - Tricuspid valve: There was mild regurgitation. - Pulmonary arteries: PA peak pressure: 34  mm Hg (S). - Inferior vena cava: The vessel was normal in size. The   respirophasic diameter changes were in the normal range (>= 50%),   consistent with normal central venous pressure.   Impressions:   - LVEF 55-60%, severe LVH, normal wall motion, grade 2 DD with   elevated LV filling pressure, aortic valve sclerosis, trivial MR,   moderate LAE, mild TR, RVSP 34 mmHg, normal IVC.    ASSESSMENT:    1. Paroxysmal atrial  fibrillation (HCC)   2. History of CVA (cerebrovascular accident)   3. Coronary artery disease of native artery of native heart with stable angina pectoris   4. Gastrointestinal hemorrhage, unspecified gastrointestinal hemorrhage type        PLAN:  In order of problems listed above:  CAD: s/p DES of LCx for NSTEMI in Dec. 2018. Off antiplatelet therapy since on Eliquis . Continue  Crestor . She is asymptomatic.   Hyperlipidemia: On Crestor  20 mg and tolerating well. Last LDL 104  Hypertension: Blood pressure is satisfactory especially in light of history of orthostasis. Continue low dose losartan   4.   PAfib noted on loop recorder.  Now off Eliquis  due to recurrent GI bleeding. She does have history of prior embolic stroke. I reviewed prior options discussed with Dr Nancey. I doubt AAD therapy will offer much benefit since overall Afib burden is low and there are side effects of medication. We discussed option of Watchman device and family is very much in favor of this approach. I will reach out to Dr Nancey and see if we can have her see one of our Watchman team.   5.   Hypothyroidism  6.   History of orthostatic hypotension. Resolved. Now on very low dose losartan   7.   Pafib. Asymptomatic.   8. Recurrent severe iron  deficiency anemia with likely slow GI blood loss.    Medication Adjustments/Labs and Tests Ordered: Current medicines are reviewed at length with the patient today.  Concerns regarding medicines are outlined above.  Medication changes, Labs and Tests ordered today are listed in the Patient Instructions below. There are no Patient Instructions on file for this visit.    Signed, Jacari Iannello, MD  02/22/2024 9:15 AM    [facility] [address] [place], [place], [place]  [address] Phone: [phone]; Fax: [phone]

## 2024-02-22 ENCOUNTER — Ambulatory Visit: Attending: Cardiology | Admitting: Cardiology

## 2024-02-22 ENCOUNTER — Encounter: Payer: Self-pay | Admitting: Cardiology

## 2024-02-22 VITALS — BP 146/70 | HR 87 | Ht 62.0 in | Wt 180.0 lb

## 2024-02-22 DIAGNOSIS — I480 Paroxysmal atrial fibrillation: Secondary | ICD-10-CM | POA: Diagnosis not present

## 2024-02-22 DIAGNOSIS — Z8673 Personal history of transient ischemic attack (TIA), and cerebral infarction without residual deficits: Secondary | ICD-10-CM | POA: Diagnosis not present

## 2024-02-22 DIAGNOSIS — I25118 Atherosclerotic heart disease of native coronary artery with other forms of angina pectoris: Secondary | ICD-10-CM

## 2024-02-22 DIAGNOSIS — K922 Gastrointestinal hemorrhage, unspecified: Secondary | ICD-10-CM | POA: Diagnosis not present

## 2024-02-22 NOTE — Patient Instructions (Signed)
 Medication Instructions:  Continue same medications *If you need a refill on your cardiac medications before your next appointment, please call your pharmacy*  Lab Work: None ordered  Testing/Procedures: None ordered  Follow-Up: At [facility], you and your health needs are our priority.  As part of our continuing mission to provide you with exceptional heart care, our providers are all part of one team.  This team includes your primary Cardiologist (physician) and Advanced Practice Providers or APPs (Physician Assistants and Nurse Practitioners) who all work together to provide you with the care you need, when you need it.  Your next appointment:  6 months    Call in Feb to schedule May appointment     Provider:  Dr.Jordan      Dr.Mealor's office will call back with appointment to talk about Watchman   We recommend signing up for the patient portal called MyChart.  Sign up information is provided on this After Visit Summary.  MyChart is used to connect with patients for Virtual Visits (Telemedicine).  Patients are able to view lab/test results, encounter notes, upcoming appointments, etc.  Non-urgent messages can be sent to your provider as well.   To learn more about what you can do with MyChart, go to [email or web address].

## 2024-02-23 ENCOUNTER — Ambulatory Visit: Payer: Self-pay

## 2024-02-23 DIAGNOSIS — I480 Paroxysmal atrial fibrillation: Secondary | ICD-10-CM

## 2024-02-24 LAB — CUP PACEART REMOTE DEVICE CHECK
Date Time Interrogation Session: 20251106044344
Implantable Pulse Generator Implant Date: 20231116
Pulse Gen Model: 5000
Pulse Gen Serial Number: 511019978

## 2024-02-27 NOTE — Progress Notes (Signed)
 Remote Loop Recorder Transmission

## 2024-02-29 ENCOUNTER — Ambulatory Visit: Payer: Self-pay | Admitting: Cardiovascular Disease

## 2024-03-01 ENCOUNTER — Ambulatory Visit: Payer: Self-pay

## 2024-03-01 DIAGNOSIS — R809 Proteinuria, unspecified: Secondary | ICD-10-CM | POA: Diagnosis not present

## 2024-03-01 DIAGNOSIS — N1832 Chronic kidney disease, stage 3b: Secondary | ICD-10-CM | POA: Diagnosis not present

## 2024-03-01 DIAGNOSIS — N179 Acute kidney failure, unspecified: Secondary | ICD-10-CM | POA: Diagnosis not present

## 2024-03-01 DIAGNOSIS — I129 Hypertensive chronic kidney disease with stage 1 through stage 4 chronic kidney disease, or unspecified chronic kidney disease: Secondary | ICD-10-CM | POA: Diagnosis not present

## 2024-03-01 DIAGNOSIS — N390 Urinary tract infection, site not specified: Secondary | ICD-10-CM | POA: Diagnosis not present

## 2024-03-22 ENCOUNTER — Other Ambulatory Visit: Payer: Self-pay | Admitting: Family Medicine

## 2024-03-22 NOTE — Telephone Encounter (Signed)
 Last filled on 05/08/22 #90 caps/ 3 refills   Last OV was a hospital f/u on 12/23/23, no future appts

## 2024-03-26 ENCOUNTER — Ambulatory Visit: Payer: Self-pay

## 2024-03-26 ENCOUNTER — Telehealth: Payer: Self-pay | Admitting: *Deleted

## 2024-03-26 DIAGNOSIS — I480 Paroxysmal atrial fibrillation: Secondary | ICD-10-CM | POA: Diagnosis not present

## 2024-03-26 LAB — CUP PACEART REMOTE DEVICE CHECK
Date Time Interrogation Session: 20251207050401
Implantable Pulse Generator Implant Date: 20231116
Pulse Gen Model: 5000
Pulse Gen Serial Number: 511019978

## 2024-03-27 NOTE — Telephone Encounter (Signed)
 Pt has been called and scheduled with wait list, appointment made with daughter.

## 2024-03-28 NOTE — Telephone Encounter (Signed)
 Refilled placed for 90 days

## 2024-03-29 NOTE — Progress Notes (Unsigned)
 Electrophysiology Office Note:   Date:  03/30/2024  ID:  MAVEN ROSANDER, DOB Jun 22, 1932, MRN [id number]  Primary Cardiologist: Peter Jordan, MD Electrophysiologist: Eulas FORBES Furbish, MD      History of Present Illness:   Valerie Ramirez is a 89 y.o. female with h/o HTN, HLD, hypothyroidism, stroke, and CAD who is being seen today for evaluation for Watchman device at the request of Dr. Jordan.  Discussed the use of AI scribe software for clinical note transcription with the patient, who gave verbal consent to proceed.  History of Present Illness Valerie Ramirez is an 89 year old female with atrial fibrillation who presents for evaluation regarding the potential use of a Watchman device. She is accompanied by her daughter and son. She was referred by Dr. Jordan for consideration of a Watchman device.  She has a history of atrial fibrillation and has experienced strokes in the past. She has been on anticoagulation therapy to minimize her stroke risk, but has had issues with bleeding, which complicates her treatment options. She is currently not on a blood thinner due to the bleeding risk.  The Watchman procedure involves general anesthesia and has a potential for complications such as bleeding.  She has a history of kidney issues, which is relevant as the procedure may require the use of contrast that could worsen her kidney function. She experiences swelling in her ankles and uses Lasix as needed, as advised by her kidney doctor.  She is hard of hearing and often does not wear her hearing aids, but she is still able to engage in conversations and make her own decisions. Her family notes that she can read the paper and books, indicating she maintains a level of cognitive function.   Review of systems complete and found to be negative unless listed in HPI.   EP Information / Studies Reviewed:    EKG is not ordered today. EKG from 01/13/24 reviewed which showed SR with PR and QRS  80ms.      TEE 12/2021  1. Left ventricular ejection fraction, by estimation, is 60 to 65%. The  left ventricle has normal function. The left ventricle has no regional  wall motion abnormalities.   2. Right ventricular systolic function is normal. The right ventricular  size is normal.   3. No left atrial/left atrial appendage thrombus was detected.   4. The mitral valve is normal in structure. Mild mitral valve  regurgitation. No evidence of mitral stenosis.   5. The aortic valve is normal in structure. Aortic valve regurgitation is  not visualized. Aortic valve sclerosis/calcification is present, without  any evidence of aortic stenosis.   6. There is Moderate (Grade III) atheroma plaque involving the aortic  arch and descending aorta.   7. The inferior vena cava is normal in size with greater than 50%  respiratory variability, suggesting right atrial pressure of 3 mmHg.   8. Agitated saline contrast bubble study was negative, with no evidence  of any interatrial shunt.    Risk Assessment/Calculations:    CHA2DS2-VASc Score = 7   This indicates a 11.2% annual risk of stroke. The patient's score is based upon: CHF History: 0 HTN History: 1 Diabetes History: 0 Stroke History: 2 Vascular Disease History: 1 Age Score: 2 Gender Score: 1         Physical Exam:   VS:  BP (!) 162/70 (BP Location: Left Arm, Patient Position: Sitting, Cuff Size: Normal)   Pulse 74   Ht 5'  2 (1.575 m)   Wt 175 lb (79.4 kg)   SpO2 98%   BMI 32.01 kg/m    Wt Readings from Last 3 Encounters:  03/30/24 175 lb (79.4 kg)  02/22/24 180 lb (81.6 kg)  01/13/24 173 lb (78.5 kg)     GEN: Frail elderly female, in no acute distress NECK: No JVD CARDIAC: Normal rate, regular rhythm RESPIRATORY:  Clear to auscultation without rales, wheezing or rhonchi  ABDOMEN: Soft, non-distended EXTREMITIES:  No edema; No deformity   ASSESSMENT AND PLAN:    I have seen Valerie Ramirez in the office today who  is being considered for a Watchman left atrial appendage closure device. I believe they will benefit from this procedure given their history of atrial fibrillation, CHA2DS2-VASc score of 7 and unadjusted ischemic stroke rate of 11.2% per year. Unfortunately, the patient is not felt to be a long term anticoagulation candidate secondary to recurrent GI bleeding. The patient's chart has been reviewed and I feel that they would be a candidate for short term oral anticoagulation after Watchman implant.   It is my belief that after undergoing a LAA closure procedure, Valerie Ramirez will not need long term anticoagulation which eliminates anticoagulation side effects and major bleeding risk.   Procedural risks for the Watchman implant have been reviewed with the patient including a 0.5% risk of stroke, <1% risk of perforation and <1% risk of device embolization. Other risks include bleeding, vascular damage, tamponade, worsening renal function, and death. The patient understands these risk and wishes to proceed.     The published clinical data on the safety and effectiveness of WATCHMAN include but are not limited to the following: - Holmes DR, Jess BEARD, Sick P et al. for the PROTECT AF Investigators. Percutaneous closure of the left atrial appendage versus warfarin therapy for prevention of stroke in patients with atrial fibrillation: a randomised non-inferiority trial. Lancet 2009; 374: 534-42. GLENWOOD Jess BEARD, Doshi SK, Jonita VEAR Satchel D et al. on behalf of the PROTECT AF Investigators. Percutaneous Left Atrial Appendage Closure for Stroke Prophylaxis in Patients With Atrial Fibrillation 2.3-Year Follow-up of the PROTECT AF (Watchman Left Atrial Appendage System for Embolic Protection in Patients With Atrial Fibrillation) Trial. Circulation 2013; 127:720-729. - Alli O, Doshi S,  Kar S, Reddy VY, Sievert H et al. Quality of Life Assessment in the Randomized PROTECT AF (Percutaneous Closure of the Left Atrial  Appendage Versus Warfarin Therapy for Prevention of Stroke in Patients With Atrial Fibrillation) Trial of Patients at Risk for Stroke With Nonvalvular Atrial Fibrillation. J Am Coll Cardiol 2013; 61:1790-8. GLENWOOD Satchel DR, Archer RAMAN, Price M, Whisenant B, Sievert H, Doshi S, Huber K, Reddy V. Prospective randomized evaluation of the Watchman left atrial appendage Device in patients with atrial fibrillation versus long-term warfarin therapy; the PREVAIL trial. Journal of the [employer] of Cardiology, Vol. 4, No. 1, 2014, 1-11. - Kar S, Doshi SK, Sadhu A, Horton R, Osorio J et al. Primary outcome evaluation of a next-generation left atrial appendage closure device: results from the PINNACLE FLX trial. Circulation 2021;143(18)1754-1762.   HAS-BLED score 5 Hypertension Yes  Abnormal renal and liver function (Dialysis, transplant, Cr >2.26 mg/dL /Cirrhosis or Bilirubin >2x Normal or AST/ALT/AP >3x Normal) Yes  Stroke Yes  Bleeding Yes  Labile INR (Unstable/high INR) No  Elderly (>65) Yes  Drugs or alcohol (>= 8 drinks/week, anti-plt or NSAID) No   CHA2DS2-VASc Score = 7  The patient's score is based upon: CHF History: 0  HTN History: 1 Diabetes History: 0 Stroke History: 2 Vascular Disease History: 1 Age Score: 2 Gender Score: 1       ASSESSMENT AND PLAN: # Recurrent strokes # Atrial fibrillation # Hypercoagulable state due to atrial fibrillation # GI bleeding  After a detailed discussion regarding procedural risks of Watchman implant with patient and family, they elected not to proceed.  Given her advanced age and co morbidities, I think this is reasonable given that she would be higher than average risk for procedural complications.  We discussed role of oral anticoagulation for stroke prophylaxis.  Dr. Nancey previously felt that she would not be a candidate for anticoagulation moving forward due to her bleeding history.  We did discuss that an alternative approach could be starting  antiarrhythmic drug therapy in efforts to maintain sinus rhythm and therefore lower her stroke risk.  Given her renal dysfunction, she would not be a candidate for Tikosyn.  Amiodarone would be her best option.  The patient and her family would like to think about this and will let us  know if they would like to proceed.  Additionally, I will communicate my discussions with her primary EP, Dr. Nancey, and see if he feels strongly about starting amiodarone to maintain sinus rhythm, which could then be monitored with her loop recorder.  Follow up with Dr. Nancey .   Total time of encounter: 60 minutes total time of encounter, including face-to-face patient care, coordination of care and counseling regarding high complexity medical decision making re: candidacy for Watchman device, atrial fibrillation and bleeding risk.  Signed, Fonda Kitty, MD

## 2024-03-30 ENCOUNTER — Ambulatory Visit: Attending: Cardiology | Admitting: Cardiology

## 2024-03-30 ENCOUNTER — Encounter: Payer: Self-pay | Admitting: Cardiology

## 2024-03-30 VITALS — BP 162/70 | HR 74 | Ht 62.0 in | Wt 175.0 lb

## 2024-03-30 DIAGNOSIS — I4891 Unspecified atrial fibrillation: Secondary | ICD-10-CM

## 2024-03-30 DIAGNOSIS — Z8673 Personal history of transient ischemic attack (TIA), and cerebral infarction without residual deficits: Secondary | ICD-10-CM | POA: Diagnosis not present

## 2024-03-30 DIAGNOSIS — K922 Gastrointestinal hemorrhage, unspecified: Secondary | ICD-10-CM | POA: Diagnosis not present

## 2024-03-30 DIAGNOSIS — D6869 Other thrombophilia: Secondary | ICD-10-CM

## 2024-03-30 NOTE — Patient Instructions (Signed)

## 2024-04-02 ENCOUNTER — Ambulatory Visit: Payer: Self-pay

## 2024-04-02 MED ORDER — DONEPEZIL HCL 10 MG PO TABS: 10.0000 mg | ORAL_TABLET | Freq: Every day | ORAL | 0 refills | Status: DC

## 2024-04-03 NOTE — Progress Notes (Signed)
 Remote Loop Recorder Transmission

## 2024-04-06 ENCOUNTER — Ambulatory Visit: Payer: Self-pay | Admitting: Cardiovascular Disease

## 2024-04-21 ENCOUNTER — Other Ambulatory Visit: Payer: Self-pay | Admitting: Obstetrics and Gynecology

## 2024-04-26 ENCOUNTER — Ambulatory Visit: Payer: Self-pay

## 2024-04-26 ENCOUNTER — Other Ambulatory Visit: Payer: Self-pay | Admitting: Family Medicine

## 2024-04-26 DIAGNOSIS — D509 Iron deficiency anemia, unspecified: Secondary | ICD-10-CM

## 2024-04-26 DIAGNOSIS — Z8673 Personal history of transient ischemic attack (TIA), and cerebral infarction without residual deficits: Secondary | ICD-10-CM | POA: Diagnosis not present

## 2024-04-26 LAB — CUP PACEART REMOTE DEVICE CHECK
Date Time Interrogation Session: 20260107055022
Implantable Pulse Generator Implant Date: 20231116
Pulse Gen Model: 5000
Pulse Gen Serial Number: 511019978

## 2024-04-28 ENCOUNTER — Ambulatory Visit: Payer: Self-pay | Admitting: Cardiovascular Disease

## 2024-04-30 NOTE — Progress Notes (Signed)
 Remote Loop Recorder Transmission

## 2024-05-01 ENCOUNTER — Other Ambulatory Visit: Payer: Self-pay | Admitting: *Deleted

## 2024-05-01 NOTE — Telephone Encounter (Signed)
 Please verify with patient's family that she is still taking this Thanks

## 2024-05-01 NOTE — Addendum Note (Signed)
 Addended by: RAYLEEN GLENDORA HERO on: 05/01/2024 04:49 PM   Modules accepted: Orders

## 2024-05-01 NOTE — Telephone Encounter (Signed)
 Received fax request for med. Doesn't look like PCP prescribes this so will route to PCP for review   Last OV was a hospital f/u on 12/23/23

## 2024-05-03 ENCOUNTER — Ambulatory Visit: Payer: Self-pay

## 2024-05-03 NOTE — Telephone Encounter (Signed)
 Left VM requesting pt's daughter to call the office back   **E2C2 PLEASE SEE PCP'S QUESTION BELOW. IF PT'S DAUGHTER CALLS BACK PLEASE REVIEW WITH PT/DAUGHTER**

## 2024-05-08 NOTE — Telephone Encounter (Signed)
 Left VM requesting pt's daughter to call the office back   **E2C2 PLEASE SEE PCP'S QUESTION BELOW. IF PT'S DAUGHTER CALLS BACK PLEASE REVIEW WITH PT/DAUGHTER**

## 2024-05-10 NOTE — Telephone Encounter (Signed)
 We have called daughter x 3 and sent my chart message ok to deny refill?

## 2024-05-10 NOTE — Telephone Encounter (Signed)
 Left message to return call to our office.  Sending my chart message.   Please provide message from provider/office when call is returned from patient.

## 2024-05-10 NOTE — Telephone Encounter (Signed)
 Yes, thanks

## 2024-05-12 ENCOUNTER — Other Ambulatory Visit: Payer: Self-pay | Admitting: Family Medicine

## 2024-05-14 NOTE — Telephone Encounter (Signed)
 Please advise if ok to refill patient was seen in September labs requested recheck in 2 weeks if not seen by nephrology. Has had follow up with cardiology

## 2024-05-17 ENCOUNTER — Ambulatory Visit: Payer: Self-pay

## 2024-05-17 NOTE — Telephone Encounter (Signed)
 Will see patient then Agree with ER and UC precautions

## 2024-05-17 NOTE — Telephone Encounter (Signed)
 FYI Only or Action Required?: FYI only for provider: appointment scheduled on 05/18/24.  Patient was last seen in primary care on 12/23/2023 by Randeen Laine LABOR, MD.  Called Nurse Triage reporting Altered Mental Status.  Symptoms began 05/11/24.  Interventions attempted: Nothing.  Symptoms are: unchanged.  Triage Disposition: See Physician Within 24 Hours  Patient/caregiver understands and will follow disposition?: yes     Message from [place] E sent at 05/17/2024 11:14 AM EST  Summary: possible UTI  memory issues   Reason for Triage: possible UTI acting crazy not eating properly forgets things she needs to do memory issues pacing around the house happened all of th sudden         Reason for Disposition  [1] Confusion getting worse AND [2] slow onset (days to weeks)  Answer Assessment - Initial Assessment Questions 1. MAIN CONCERN OR SYMPTOM:  What is your main concern right now? What questions do you have? What's the main symptom you're worried about? (e.g., confusion, memory loss)     Not acting herself, pacing around house, forgetful 2. ONSET:  When did the symptom start (or worsen)? (minutes, hours, days, weeks)     Friday  3. BETTER-SAME-WORSE: Are you (the patient) getting better, staying the same, or getting worse compared to the day you (they) were diagnosed or most recent hospital discharge?     worse 4. DIAGNOSIS: Was the dementia diagnosed by a doctor? If Yes, ask: When? (e.g., days, months, years ago)     Yes  5. MEDICINES: Has there been any change in medicines recently? (e.g., narcotics, antihistamines, benzodiazepines, etc.)     No changes  6. OTHER SYMPTOMS: Are there any other symptoms? (e.g., cough, falling, fever, pain)     Leg hurt, sleeping more, eating less, c/o feeling cold   7. SUPPORT: What type of support do you (the patient) have? Note: Document living circumstances and support (e.g., family, nursing home).     Daughter checks  in on her  Protocols used: Dementia Symptoms and Questions-A-AH

## 2024-05-18 ENCOUNTER — Encounter: Payer: Self-pay | Admitting: Family Medicine

## 2024-05-18 ENCOUNTER — Ambulatory Visit (INDEPENDENT_AMBULATORY_CARE_PROVIDER_SITE_OTHER): Admitting: Family Medicine

## 2024-05-18 VITALS — BP 133/71 | HR 64 | Temp 97.7°F | Ht 62.0 in | Wt 173.4 lb

## 2024-05-18 DIAGNOSIS — N1832 Chronic kidney disease, stage 3b: Secondary | ICD-10-CM | POA: Diagnosis not present

## 2024-05-18 DIAGNOSIS — R410 Disorientation, unspecified: Secondary | ICD-10-CM

## 2024-05-18 DIAGNOSIS — N3000 Acute cystitis without hematuria: Secondary | ICD-10-CM

## 2024-05-18 DIAGNOSIS — F01A0 Vascular dementia, mild, without behavioral disturbance, psychotic disturbance, mood disturbance, and anxiety: Secondary | ICD-10-CM | POA: Diagnosis not present

## 2024-05-18 LAB — POCT URINE DIPSTICK
Bilirubin, UA: NEGATIVE
Glucose, UA: NEGATIVE mg/dL
Ketones, POC UA: NEGATIVE mg/dL
Nitrite, UA: NEGATIVE
POC PROTEIN,UA: 100 — AB
Spec Grav, UA: 1.02
Urobilinogen, UA: 0.2 U/dL
pH, UA: 5.5

## 2024-05-18 MED ORDER — CEPHALEXIN 500 MG PO CAPS: 500.0000 mg | ORAL_CAPSULE | Freq: Two times a day (BID) | ORAL | 0 refills | Status: AC

## 2024-05-18 NOTE — Assessment & Plan Note (Addendum)
 Continues to worsen/progress  Pt continues neurology care and next appointment is in may  Donepezil  is off med list-unsure why  More confusion lately - in setting of uti (will treat this)  Family may call for earlier follow up   Occational trouble speaking (? If TIA), but in retrospect family thinks this may be from uti related confusion

## 2024-05-18 NOTE — Progress Notes (Signed)
 "  Subjective:    Patient ID: Valerie Ramirez, female    DOB: Jan 03, 1933, 89 y.o.   MRN: [id number]  HPI  Wt Readings from Last 3 Encounters:  05/18/24 173 lb 6 oz (78.6 kg)  03/30/24 175 lb (79.4 kg)  02/22/24 180 lb (81.6 kg)   31.71 kg/m  Vitals:   05/18/24 1027 05/18/24 1046  BP: (!) 148/78 133/71  Pulse: 64   Temp: 97.7 F (36.5 C)   SpO2: 100%    Pt presents for c/o  MS change (in context of vascular dementia and hearing loss)   ? Possible uti    Not acting right  Since Friday  Unsure if frequency - won't tell Wears undergarments - and urine smells bad   Family cannot make her change  No blood  No fever   Some decrease in appetite this week  No nausea or vomiting   Vascular dementia Sees neurology  Previously took donepezil   Results for orders placed or performed in visit on 05/18/24  POCT URINE DIPSTICK   Collection Time: 05/18/24 10:43 AM  Result Value Ref Range   Color, UA yellow yellow   Clarity, UA cloudy (A) clear   Glucose, UA negative negative mg/dL   Bilirubin, UA negative negative   Ketones, POC UA negative negative mg/dL   Spec Grav, UA 8.979 8.989 - 1.025   Blood, UA small (A) negative   pH, UA 5.5 5.0 - 8.0   POC PROTEIN,UA =100 (A) negative, trace   Urobilinogen, UA 0.2 0.2 or 1.0 E.U./dL   Nitrite, UA Negative Negative   Leukocytes, UA Moderate (2+) (A) Negative     Hypothyroid/ no meds Lab Results  Component Value Date   TSH 3.075 09/09/2023   CKD Lab Results  Component Value Date   NA 139 12/23/2023   K 4.7 12/23/2023   CO2 25 12/23/2023   GLUCOSE 130 (H) 12/23/2023   BUN 27 (H) 12/23/2023   CREATININE 2.02 (H) 12/23/2023   CALCIUM  9.2 12/23/2023   GFR 21.50 (L) 12/23/2023   EGFR 29 (L) 02/09/2021   GFRNONAA 30 (L) 09/11/2023  Has been to [facility]   Prediabetes Lab Results  Component Value Date   HGBA1C 6.2 09/07/2023   HGBA1C 6.2 (H) 11/14/2021   HGBA1C 6.1 12/11/2018   Iron  def anemia  Lab  Results  Component Value Date   WBC 6.7 01/25/2024   HGB 11.4 (L) 01/25/2024   HCT 35.4 (L) 01/25/2024   MCV 89.6 01/25/2024   PLT 159 01/25/2024      Patient Active Problem List   Diagnosis Date Noted   Angiodysplasia of small intestine, except duodenum without bleeding 09/11/2023   Chronic anastomotic ulcer 09/11/2023   Paroxysmal atrial fibrillation (HCC) 09/09/2023   Iron  deficiency anemia 09/09/2023   GI bleed 09/08/2023   Poor balance 09/07/2023   Chronic cough 12/17/2022   Dysphagia 12/17/2022   History of atrial fibrillation 03/17/2022   Coronary artery disease of native artery of native heart with stable angina pectoris 02/01/2022   Vascular dementia (HCC) 01/10/2022   Dyslipidemia 01/09/2022   Mild cognitive impairment 01/06/2022   Fall 06/24/2021   Syncope and collapse 06/24/2021   Acute pain of both knees 06/24/2021   Light headedness 06/24/2021   Bilateral impacted cerumen 06/24/2021   Heme positive stool 02/24/2021   History of CVA (cerebrovascular accident) 02/14/2020   Headache 02/14/2020   Dizziness 12/26/2017   UTI (urinary tract infection) 12/26/2017   Depression 10/26/2017  H/O subdural hemorrhage 09/18/2017   CAD S/P percutaneous coronary angioplasty 09/18/2017   Epistaxis    Elevated transaminase level 08/03/2017   Radial artery injury, left, sequela 06/15/2017   H/O: CVA (cerebrovascular accident) 05/26/2017   Sick sinus syndrome (HCC) 05/25/2017   Chronic kidney disease, stage 3b (HCC) 04/05/2017   Anemia 04/05/2017   Thoracic back pain 09/15/2016   History of radius fracture 06/01/2016   AKI (acute kidney injury) 04/25/2016   Leukocytosis 04/25/2016   Status post cervical spinal fusion 08/18/2015   Acute pain of right shoulder 05/16/2015   Osteoarthritis, hip, bilateral 08/12/2014   Hearing loss in right ear 06/12/2014   Colon cancer screening 08/10/2013   Seborrheic keratoses, inflamed 08/10/2013   History of fall 04/10/2013    Encounter for Medicare annual wellness exam 08/08/2012   Gout 07/14/2012   History of endometrial cancer 05/25/2012   Degenerative joint disease of cervical spine 05/03/2011   Other screening mammogram 03/31/2011   Prediabetes 09/26/2007   Obesity (BMI 30-39.9) 07/12/2007   Essential hypertension 07/11/2007   History of cardiomyopathy 07/11/2007   Osteoarthritis 07/11/2007   MIXED INCONTINENCE URGE AND STRESS 07/11/2007   Hypothyroidism 10/05/2006   HYPERCHOLESTEROLEMIA, PURE 10/05/2006   Past Medical History:  Diagnosis Date   Arthritis    OA   CAD (coronary artery disease)    stent December 2018   Cancer [facility])    endometrial   GERD (gastroesophageal reflux disease)    Hearing loss    History of kidney stones    Hyperlipidemia    Hypertension    Hypothyroid    not in years   Shortness of breath dyspnea    04/25/17- not anymore   Past Surgical History:  Procedure Laterality Date   ABDOMINAL HYSTERECTOMY     ANTERIOR CERVICAL DECOMP/DISCECTOMY FUSION [name] 08/18/2015   Procedure: C5-6, C6-7 Anterior Cervical Discectomy and Fusion, Allograft, Plate;  Surgeon: Oneil JAYSON Herald, MD;  Location: [facility] OR;  Service: Orthopedics;  Laterality: [name];   bone spur removed Right    shoulder   BOWEL RESECTION  07/04/2012   Procedure: SMALL BOWEL RESECTION;  Surgeon: Toribio LITTIE Percy, MD;  Location: [facility] ORS;  Service: Gynecology;;   BREAST BIOPSY     CHOLECYSTECTOMY     COLONOSCOPY [name] 09/10/2023   Procedure: COLONOSCOPY;  Surgeon: Unk Corinn Skiff, MD;  [facility] ENDOSCOPY;  Service: Gastroenterology;  Laterality: [name];   CORONARY STENT INTERVENTION [name] 04/06/2017   Procedure: CORONARY STENT INTERVENTION;  Surgeon: Darron Deatrice LABOR, MD;  Location: [facility] INVASIVE CV LAB;  Service: Cardiovascular;  Laterality: [name];   DILATION AND CURETTAGE OF UTERUS  1999   ESOPHAGOGASTRODUODENOSCOPY [name] 09/09/2023   Procedure: EGD (ESOPHAGOGASTRODUODENOSCOPY);  Surgeon: Jinny Carmine, MD;  Location: [facility]  ENDOSCOPY;  Service: Endoscopy;  Laterality: [name];   EYE SURGERY Bilateral    cataracts   FALSE ANEURYSM REPAIR Right 04/26/2017   Procedure: 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;  Surgeon: Laurence Redell LITTIE, MD;  Location: [facility] OR;  Service: Vascular;  Laterality: Right;   HAND SURGERY  12/2008   after hand fx/due to fall   JOINT REPLACEMENT Bilateral 07/1998   knees   KNEE ARTHROPLASTY  05/23/1998   total right   LAPAROTOMY Bilateral 07/04/2012   Procedure: EXPLORATORY LAPAROTOMY TOTAL ABDOMINAL HYSTERECTOMY BILATERAL SALPINGO-OOPHORECTOMY with small bowel resection ;  Surgeon: Toribio LITTIE Percy, MD;  Location: [facility] ORS;  Service: Gynecology;  Laterality: Bilateral;   LEFT HEART CATH AND CORONARY ANGIOGRAPHY [name] 04/06/2017  Procedure: LEFT HEART CATH AND CORONARY ANGIOGRAPHY;  Surgeon: Cherrie Toribio SAUNDERS, MD;  Location: [facility] INVASIVE CV LAB;  Service: Cardiovascular;  Laterality: [name];   polyp removal  1999   POLYPECTOMY  09/10/2023   Procedure: POLYPECTOMY, INTESTINE;  Surgeon: Unk Corinn Skiff, MD;  Location: [facility] ENDOSCOPY;  Service: Gastroenterology;;   TEE WITHOUT CARDIOVERSION [name] 01/12/2022   Procedure: TRANSESOPHAGEAL ECHOCARDIOGRAM (TEE);  Surgeon: Perla Evalene PARAS, MD;  Location: [facility] ORS;  Service: Cardiovascular;  Laterality: [name];   TUBAL LIGATION     Social History[1] Family History  Problem Relation Age of Onset   Heart disease Father    Heart attack Father    Stroke Father    Heart disease Sister        CABG   Stroke Sister    Cancer Sister    Bladder Cancer Sister    Stroke Sister        massive brain bleed   Leukemia Brother    Lung cancer Brother    Leukemia Brother    Stomach cancer Brother    Heart disease Brother        CABG   Dementia Neg Hx    Allergies[2] Medications Ordered Prior to Encounter[3]  Review of Systems  Constitutional:  Positive for appetite change. Negative for activity change, fatigue, fever and unexpected weight change.   HENT:  Negative for congestion, ear pain, rhinorrhea, sinus pressure and sore throat.   Eyes:  Negative for pain, redness and visual disturbance.  Respiratory:  Negative for cough, shortness of breath and wheezing.   Cardiovascular:  Negative for chest pain and palpitations.  Gastrointestinal:  Negative for abdominal pain, blood in stool, constipation, diarrhea, nausea and vomiting.  Endocrine: Negative for polydipsia and polyuria.  Genitourinary:  Positive for frequency. Negative for dysuria and urgency.       Urine incontinence  Bad urine odor  Musculoskeletal:  Negative for arthralgias, back pain and myalgias.  Skin:  Negative for pallor and rash.  Allergic/Immunologic: Negative for environmental allergies.  Neurological:  Negative for dizziness, syncope and headaches.  Hematological:  Negative for adenopathy. Does not bruise/bleed easily.  Psychiatric/Behavioral:  Positive for confusion and decreased concentration. Negative for dysphoric mood. The patient is not nervous/anxious.        Objective:   Physical Exam Constitutional:      General: She is not in acute distress.    Appearance: Normal appearance. She is well-developed. She is obese. She is not ill-appearing or diaphoretic.  HENT:     Head: Normocephalic and atraumatic.  Eyes:     Conjunctiva/sclera: Conjunctivae normal.     Pupils: Pupils are equal, round, and reactive to light.  Neck:     Thyroid : No thyromegaly.     Vascular: No carotid bruit or JVD.  Cardiovascular:     Rate and Rhythm: Normal rate and regular rhythm.     Heart sounds: Normal heart sounds.     No gallop.  Pulmonary:     Effort: Pulmonary effort is normal. No respiratory distress.     Breath sounds: Normal breath sounds. No wheezing or rales.  Abdominal:     General: There is no distension or abdominal bruit.     Palpations: Abdomen is soft.     Comments: No suprapubic tenderness or fullness  No cva tenderness   Musculoskeletal:      Cervical back: Normal range of motion and neck supple.     Right lower leg: No edema.  Left lower leg: No edema.  Lymphadenopathy:     Cervical: No cervical adenopathy.  Skin:    General: Skin is warm and dry.     Coloration: Skin is not pale.     Findings: No rash.  Neurological:     Mental Status: She is alert.     Cranial Nerves: No cranial nerve deficit.     Coordination: Coordination normal.     Deep Tendon Reflexes: Reflexes are normal and symmetric. Reflexes normal.  Psychiatric:        Mood and Affect: Mood normal. Mood is not anxious or depressed.        Cognition and Memory: Cognition is impaired. Memory is impaired.     Comments: Quiet  Occasionally nods  Denies pain anywhere            Assessment & Plan:   Assessment & Plan Mild vascular dementia without behavioral disturbance, psychotic disturbance, mood disturbance, or anxiety (HCC) Continues to worsen/progress  Pt continues neurology care and next appointment is in may  Donepezil  is off med list-unsure why  More confusion lately - in setting of uti (will treat this)  Family may call for earlier follow up   Occational trouble speaking (? If TIA), but in retrospect family thinks this may be from uti related confusion     Chronic kidney disease, stage 3b (HCC) Under care of nephrology now  Positive urinalysis today-suspect infection  Will treat with keflex  until culture returns     Confusion Worsened since Friday I setting of vascular dementia and current uti  Will treat with keflex  and obs Call back and Er precautions noted in detail today   Orders:   POCT URINE DIPSTICK  Acute cystitis without hematuria Malodorous urine (pt is incontinent)  Confusion worse for a week Reduced appetite  Urinalysis cloudy, protein , rbc, wbc  Exam consistent with above   Culture pending  Treatment with keflex  (does have ckd)   Call back and Er precautions noted in detail today   Instructed to call if  symptoms worsen before culture returns           [1]  Social History Tobacco Use   Smoking status: Never   Smokeless tobacco: Never  Vaping Use   Vaping status: Never Used  Substance Use Topics   Alcohol use: Never    Alcohol/week: 0.0 standard drinks of alcohol   Drug use: Never  [2]  Allergies Allergen Reactions   Allopurinol  Nausea Only   Lipitor [Atorvastatin  Calcium ] Itching and Other (See Comments)    Leg aches and aching all over   Morphine  And Codeine Other (See Comments)    Makes the patient loopy,    Simvastatin Other (See Comments)    MUSCLE PAIN   Tape Itching, Rash and Other (See Comments)    Cannot use paper tape --- also causes blisters. Use plastic tape  [3]  Current Outpatient Medications on File Prior to Visit  Medication Sig Dispense Refill   Acetaminophen  (TYLENOL  8 HOUR PO) Take 3 tablets by mouth daily.     amLODipine  (NORVASC ) 10 MG tablet Take 1 tablet (10 mg total) by mouth daily. 90 tablet 1   Cholecalciferol (VITAMIN D-3) 25 MCG (1000 UT) CAPS Take 1,000 Units by mouth daily.     colchicine  0.6 MG tablet TAKE 1 TABLET BY MOUTH DAILY (Patient taking differently: Take 0.3 mg by mouth daily.) 90 tablet 0   diclofenac  Sodium (VOLTAREN  ARTHRITIS PAIN) 1 % GEL Apply 4  g topically 4 (four) times daily. 100 g 11   DULoxetine  (CYMBALTA ) 30 MG capsule TAKE 1 CAPSULE BY MOUTH DAILY 90 capsule 3   famotidine  (PEPCID ) 20 MG tablet TAKE 1 TABLET BY MOUTH 2 TIMES A DAY 180 tablet 0   ferrous sulfate  (FEROSUL) 325 (65 FE) MG tablet TAKE 1 TABLET BY MOUTH DAILY 90 tablet 1   furosemide (LASIX) 40 MG tablet Take 40 mg by mouth daily.     lidocaine  (LIDODERM ) 5 % Place 1 patch onto the skin daily. Remove & Discard patch within 12 hours or as directed by MD 30 patch 11   losartan  (COZAAR ) 50 MG tablet TAKE 1 TABLET BY MOUTH DAILY 90 tablet 0   Magnesium Chloride (MAG-SR PLUS CALCIUM  PO) Take by mouth. Taking 1 tab once for dizziness     mupirocin  ointment  (BACTROBAN ) 2 % APPLY TWO TIMES A DAY TO AFFECTED AREA 22 g 0   nitroGLYCERIN  (NITROSTAT ) 0.4 MG SL tablet Place 1 tablet (0.4 mg total) under the tongue every 5 (five) minutes x 3 doses as needed for chest pain. 25 tablet 12   rosuvastatin  (CRESTOR ) 20 MG tablet TAKE 1 TABLET BY MOUTH AT BEDTIME (Patient taking differently: Take 10 mg by mouth at bedtime.) 90 tablet 3   No current facility-administered medications on file prior to visit.   "

## 2024-05-18 NOTE — Assessment & Plan Note (Signed)
 Malodorous urine (pt is incontinent)  Confusion worse for a week Reduced appetite  Urinalysis cloudy, protein , rbc, wbc  Exam consistent with above   Culture pending  Treatment with keflex  (does have ckd)   Call back and Er precautions noted in detail today   Instructed to call if symptoms worsen before culture returns

## 2024-05-18 NOTE — Assessment & Plan Note (Signed)
 Under care of nephrology now  Positive urinalysis today-suspect infection  Will treat with keflex  until culture returns

## 2024-05-18 NOTE — Patient Instructions (Signed)
 Take the keflex  as directed   Push fluids as much as you can   We will reach out with culture results when they return   If symptoms worsen before that please call

## 2024-05-21 ENCOUNTER — Ambulatory Visit: Payer: Self-pay | Admitting: Family Medicine

## 2024-05-21 LAB — URINE CULTURE
MICRO NUMBER:: 17532404
SPECIMEN QUALITY:: ADEQUATE

## 2024-05-27 ENCOUNTER — Ambulatory Visit

## 2024-06-27 ENCOUNTER — Ambulatory Visit

## 2024-07-28 ENCOUNTER — Ambulatory Visit

## 2024-08-20 ENCOUNTER — Ambulatory Visit: Admitting: Adult Health

## 2024-08-28 ENCOUNTER — Ambulatory Visit

## 2024-09-28 ENCOUNTER — Ambulatory Visit

## 2024-10-29 ENCOUNTER — Ambulatory Visit

## 2024-11-29 ENCOUNTER — Ambulatory Visit

## 2024-12-30 ENCOUNTER — Ambulatory Visit

## 2025-01-30 ENCOUNTER — Ambulatory Visit

## 2025-02-06 ENCOUNTER — Ambulatory Visit: Admitting: Adult Health

## 2025-03-02 ENCOUNTER — Ambulatory Visit
